# Patient Record
Sex: Male | Born: 1950
Health system: Southern US, Community
[De-identification: ages and names within clinical notes are randomized; demographics above are authoritative.]

## PROBLEM LIST (undated history)

## (undated) DIAGNOSIS — H3320 Serous retinal detachment, unspecified eye: Secondary | ICD-10-CM

## (undated) DIAGNOSIS — Z860100 Personal history of colon polyps, unspecified: Secondary | ICD-10-CM

## (undated) DIAGNOSIS — R0602 Shortness of breath: Secondary | ICD-10-CM

## (undated) DIAGNOSIS — L02219 Cutaneous abscess of trunk, unspecified: Secondary | ICD-10-CM

## (undated) DIAGNOSIS — I499 Cardiac arrhythmia, unspecified: Secondary | ICD-10-CM

## (undated) DIAGNOSIS — E119 Type 2 diabetes mellitus without complications: Secondary | ICD-10-CM

## (undated) DIAGNOSIS — C801 Malignant (primary) neoplasm, unspecified: Secondary | ICD-10-CM

## (undated) DIAGNOSIS — G4730 Sleep apnea, unspecified: Secondary | ICD-10-CM

## (undated) DIAGNOSIS — R778 Other specified abnormalities of plasma proteins: Secondary | ICD-10-CM

## (undated) DIAGNOSIS — Z8679 Personal history of other diseases of the circulatory system: Secondary | ICD-10-CM

## (undated) DIAGNOSIS — L988 Other specified disorders of the skin and subcutaneous tissue: Secondary | ICD-10-CM

## (undated) DIAGNOSIS — Z8701 Personal history of pneumonia (recurrent): Secondary | ICD-10-CM

## (undated) DIAGNOSIS — F909 Attention-deficit hyperactivity disorder, unspecified type: Secondary | ICD-10-CM

## (undated) DIAGNOSIS — M109 Gout, unspecified: Secondary | ICD-10-CM

## (undated) DIAGNOSIS — N19 Unspecified kidney failure: Secondary | ICD-10-CM

## (undated) DIAGNOSIS — R0689 Other abnormalities of breathing: Secondary | ICD-10-CM

## (undated) DIAGNOSIS — Z940 Kidney transplant status: Secondary | ICD-10-CM

## (undated) DIAGNOSIS — I517 Cardiomegaly: Secondary | ICD-10-CM

## (undated) DIAGNOSIS — I10 Essential (primary) hypertension: Secondary | ICD-10-CM

## (undated) DIAGNOSIS — L03319 Cellulitis of trunk, unspecified: Secondary | ICD-10-CM

## (undated) DIAGNOSIS — C61 Malignant neoplasm of prostate: Secondary | ICD-10-CM

## (undated) DIAGNOSIS — G4733 Obstructive sleep apnea (adult) (pediatric): Secondary | ICD-10-CM

## (undated) DIAGNOSIS — L0291 Cutaneous abscess, unspecified: Secondary | ICD-10-CM

## (undated) DIAGNOSIS — R7989 Other specified abnormal findings of blood chemistry: Secondary | ICD-10-CM

## (undated) DIAGNOSIS — L0390 Cellulitis, unspecified: Secondary | ICD-10-CM

## (undated) DIAGNOSIS — J309 Allergic rhinitis, unspecified: Secondary | ICD-10-CM

## (undated) HISTORY — DX: Personal history of other diseases of the circulatory system: Z86.79

## (undated) HISTORY — DX: Obstructive sleep apnea (adult) (pediatric): G47.33

## (undated) HISTORY — DX: Malignant (primary) neoplasm, unspecified: C80.1

## (undated) HISTORY — DX: Cellulitis, unspecified: L03.90

## (undated) HISTORY — DX: Other specified disorders of the skin and subcutaneous tissue: L98.8

## (undated) HISTORY — DX: Type 2 diabetes mellitus without complications: E11.9

## (undated) HISTORY — DX: Cutaneous abscess of trunk, unspecified: L02.219

## (undated) HISTORY — DX: Allergic rhinitis, unspecified: J30.9

## (undated) HISTORY — DX: Essential (primary) hypertension: I10

## (undated) HISTORY — DX: Serous retinal detachment, unspecified eye: H33.20

## (undated) HISTORY — DX: Personal history of colonic polyps: Z86.010

## (undated) HISTORY — DX: Unspecified kidney failure: N19

## (undated) HISTORY — DX: Personal history of colon polyps, unspecified: Z86.0100

## (undated) HISTORY — DX: Attention-deficit hyperactivity disorder, unspecified type: F90.9

## (undated) HISTORY — DX: Cardiomegaly: I51.7

## (undated) HISTORY — DX: Cutaneous abscess, unspecified: L02.91

## (undated) HISTORY — DX: Personal history of pneumonia (recurrent): Z87.01

## (undated) HISTORY — DX: Kidney transplant status: Z94.0

## (undated) HISTORY — PX: PROSTATECTOMY: SHX69

## (undated) HISTORY — DX: Gout, unspecified: M10.9

## (undated) HISTORY — DX: Sleep apnea, unspecified: G47.30

## (undated) HISTORY — DX: Other abnormalities of breathing: R06.89

## (undated) HISTORY — DX: Cellulitis of trunk, unspecified: L03.319

## (undated) HISTORY — DX: Malignant neoplasm of prostate: C61

---

## 1999-11-09 ENCOUNTER — Ambulatory Visit (HOSPITAL_COMMUNITY): Admission: RE | Admit: 1999-11-09 | Discharge: 1999-11-10 | Payer: Self-pay | Admitting: Nephrology

## 1999-11-09 ENCOUNTER — Encounter (INDEPENDENT_AMBULATORY_CARE_PROVIDER_SITE_OTHER): Payer: Self-pay | Admitting: *Deleted

## 1999-11-09 ENCOUNTER — Encounter: Payer: Self-pay | Admitting: Nephrology

## 2001-02-19 ENCOUNTER — Encounter: Admission: RE | Admit: 2001-02-19 | Discharge: 2001-05-20 | Payer: Self-pay | Admitting: Nephrology

## 2001-05-22 ENCOUNTER — Encounter: Admission: RE | Admit: 2001-05-22 | Discharge: 2001-08-20 | Payer: Self-pay | Admitting: Nephrology

## 2001-12-23 DIAGNOSIS — H3320 Serous retinal detachment, unspecified eye: Secondary | ICD-10-CM

## 2001-12-23 HISTORY — DX: Serous retinal detachment, unspecified eye: H33.20

## 2002-06-22 HISTORY — PX: RETINAL DETACHMENT SURGERY: SHX105

## 2002-07-13 ENCOUNTER — Encounter: Payer: Self-pay | Admitting: Ophthalmology

## 2002-07-13 ENCOUNTER — Ambulatory Visit (HOSPITAL_COMMUNITY): Admission: RE | Admit: 2002-07-13 | Discharge: 2002-07-14 | Payer: Self-pay | Admitting: Ophthalmology

## 2004-03-14 ENCOUNTER — Ambulatory Visit (HOSPITAL_COMMUNITY): Admission: RE | Admit: 2004-03-14 | Discharge: 2004-03-14 | Payer: Self-pay | Admitting: *Deleted

## 2004-03-14 ENCOUNTER — Encounter (INDEPENDENT_AMBULATORY_CARE_PROVIDER_SITE_OTHER): Payer: Self-pay | Admitting: *Deleted

## 2006-11-26 ENCOUNTER — Encounter: Admission: RE | Admit: 2006-11-26 | Discharge: 2006-11-26 | Payer: Self-pay | Admitting: Nephrology

## 2006-11-28 ENCOUNTER — Ambulatory Visit (HOSPITAL_COMMUNITY): Admission: RE | Admit: 2006-11-28 | Discharge: 2006-11-28 | Payer: Self-pay | Admitting: Nephrology

## 2006-11-30 ENCOUNTER — Inpatient Hospital Stay (HOSPITAL_COMMUNITY): Admission: EM | Admit: 2006-11-30 | Discharge: 2006-12-04 | Payer: Self-pay | Admitting: Emergency Medicine

## 2006-12-01 ENCOUNTER — Encounter: Payer: Self-pay | Admitting: Vascular Surgery

## 2007-04-28 ENCOUNTER — Ambulatory Visit (HOSPITAL_COMMUNITY): Admission: RE | Admit: 2007-04-28 | Discharge: 2007-04-28 | Payer: Self-pay | Admitting: Nephrology

## 2007-07-23 ENCOUNTER — Ambulatory Visit (HOSPITAL_COMMUNITY): Admission: RE | Admit: 2007-07-23 | Discharge: 2007-07-23 | Payer: Self-pay | Admitting: Nephrology

## 2009-01-13 ENCOUNTER — Ambulatory Visit: Payer: Self-pay | Admitting: Internal Medicine

## 2009-01-13 ENCOUNTER — Encounter: Admission: RE | Admit: 2009-01-13 | Discharge: 2009-01-13 | Payer: Self-pay | Admitting: Family Medicine

## 2009-01-13 ENCOUNTER — Inpatient Hospital Stay (HOSPITAL_COMMUNITY): Admission: AD | Admit: 2009-01-13 | Discharge: 2009-01-17 | Payer: Self-pay | Admitting: Internal Medicine

## 2010-11-22 DIAGNOSIS — C61 Malignant neoplasm of prostate: Secondary | ICD-10-CM

## 2010-11-22 HISTORY — PX: OTHER SURGICAL HISTORY: SHX169

## 2010-11-22 HISTORY — DX: Malignant neoplasm of prostate: C61

## 2011-01-13 ENCOUNTER — Encounter: Payer: Self-pay | Admitting: Nephrology

## 2011-04-08 LAB — GLUCOSE, CAPILLARY
Glucose-Capillary: 108 mg/dL — ABNORMAL HIGH (ref 70–99)
Glucose-Capillary: 160 mg/dL — ABNORMAL HIGH (ref 70–99)
Glucose-Capillary: 174 mg/dL — ABNORMAL HIGH (ref 70–99)
Glucose-Capillary: 228 mg/dL — ABNORMAL HIGH (ref 70–99)
Glucose-Capillary: 235 mg/dL — ABNORMAL HIGH (ref 70–99)
Glucose-Capillary: 270 mg/dL — ABNORMAL HIGH (ref 70–99)
Glucose-Capillary: 76 mg/dL (ref 70–99)
Glucose-Capillary: 94 mg/dL (ref 70–99)

## 2011-04-08 LAB — CBC
HCT: 30.4 % — ABNORMAL LOW (ref 39.0–52.0)
Hemoglobin: 10.5 g/dL — ABNORMAL LOW (ref 13.0–17.0)
MCHC: 32.5 g/dL (ref 30.0–36.0)
MCHC: 33.3 g/dL (ref 30.0–36.0)
MCV: 96.7 fL (ref 78.0–100.0)
MCV: 96.9 fL (ref 78.0–100.0)
Platelets: 399 10*3/uL (ref 150–400)
Platelets: 436 10*3/uL — ABNORMAL HIGH (ref 150–400)
RBC: 3.15 MIL/uL — ABNORMAL LOW (ref 4.22–5.81)
RBC: 3.33 MIL/uL — ABNORMAL LOW (ref 4.22–5.81)
RDW: 14.4 % (ref 11.5–15.5)
RDW: 14.4 % (ref 11.5–15.5)

## 2011-04-08 LAB — RENAL FUNCTION PANEL
BUN: 65 mg/dL — ABNORMAL HIGH (ref 6–23)
CO2: 27 mEq/L (ref 19–32)
Chloride: 93 mEq/L — ABNORMAL LOW (ref 96–112)
Creatinine, Ser: 10.98 mg/dL — ABNORMAL HIGH (ref 0.4–1.5)
Glucose, Bld: 96 mg/dL (ref 70–99)
Phosphorus: 6.4 mg/dL — ABNORMAL HIGH (ref 2.3–4.6)
Potassium: 5.1 mEq/L (ref 3.5–5.1)

## 2011-04-08 LAB — CULTURE, RESPIRATORY W GRAM STAIN

## 2011-04-08 LAB — LEGIONELLA ANTIGEN, URINE

## 2011-04-08 LAB — BASIC METABOLIC PANEL
CO2: 31 mEq/L (ref 19–32)
Calcium: 8.9 mg/dL (ref 8.4–10.5)
Chloride: 91 mEq/L — ABNORMAL LOW (ref 96–112)
Creatinine, Ser: 5.78 mg/dL — ABNORMAL HIGH (ref 0.4–1.5)
GFR calc Af Amer: 12 mL/min — ABNORMAL LOW (ref >60)
GFR calc non Af Amer: 10 mL/min — ABNORMAL LOW (ref >60)

## 2011-04-08 LAB — EXPECTORATED SPUTUM ASSESSMENT W GRAM STAIN, RFLX TO RESP C

## 2011-05-07 NOTE — Discharge Summary (Signed)
Russell Bowers, Russell Bowers            ACCOUNT NO.:  [id number]   MEDICAL RECORD NO.:  [id number]          PATIENT TYPE:  INP   LOCATION:  3023                         FACILITY:  [place]   PHYSICIAN:  Corinna L. Conley Canal, MDDATE OF BIRTH:  Nov 20, 1951   DATE OF ADMISSION:  01/13/2009  DATE OF DISCHARGE:  01/17/2009                               DISCHARGE SUMMARY   DISCHARGE DIAGNOSES:  1. Right lower lobe pneumonia, failed outpatient therapy.  2. End-stage renal disease.  3. Diabetes.  4. Hyperlipidemia.  5. History of gout.   DISCHARGE MEDICATIONS:  1. Augmentin 500 mg daily for 5 more days.  2. Doxycycline 100 mg p.o. b.i.d. for 5 more days.   Continue outpatient medications which include  1. Actos 30 mg a day.  2. Glipizide ER 10 mg a day.  3. Nephro-Vite daily.  4. Pravastatin 80 mg a day.  5. Allopurinol 300 mg one-half tablet daily.  6. PhosLo 3 tablets 3 times a day.  7. Zyrtec 10 mg as needed.  8. A cough suppressant of choice as needed.   CONDITION:  Stable.   DIET:  Diabetic, renal.   ACTIVITY:  Increase as tolerated.   CONSULTATIONS:  Louis Meckel, MD   PROCEDURES:  None.   FOLLOWUP:  Follow up with Dr. Rex Kras in 4-6 weeks.  We will need a  repeat chest x-ray to ensure resolution.   LABORATORIES:  Hemoglobin A1c of 6.1, initial white blood cell count was  11,000, hemoglobin 10.5, and hematocrit 32.  Sodium 132, potassium 3.8,  chloride 91, bicarbonate 31, glucose 159, BUN 31, and creatinine 5.78.  Sputum culture preliminarily is growing moderate Staphylococcus aureus.  Sensitivities are pending and final results are pending.  Urine  Legionella antigen negative.   SPECIAL STUDIES/RADIOLOGY:  Chest x-ray 2-views on admission showed  opacity in the superior segment of the right lower lobe, low lung  volumes, likely remote rib fractures.  Repeat chest x-ray showed no  significant change on January 16, 2009.   HISTORY AND HOSPITAL COURSE:  Mr.  Russell Bowers is a 60 year old white male  with multiple medical problems who had been on Avelox for treatment of  pneumonia as an outpatient.  Initially, he had had some hemoptysis.  This has since cleared.  He had fever.  He started to improve on Avelox,  but felt that he was getting worse.  He went to the [facility].   PHYSICAL EXAMINATION:  VITAL SIGNS:  His oxygen saturations were 89% on  room air, his temperature was 99.4, respiratory rate 32, and blood  pressure 100/74.  LUNGS:  He had decreased breath sounds at the bases, otherwise  unremarkable.  HEENT:  Moist mucous membranes.  No edema.   He was admitted and started on Zosyn and vancomycin.  His symptoms  improved.  He receives dialysis on Monday.  He has been changed to oral  antibiotics and feels much better and ready to go home at the time of  discharge.      Corinna L. Conley Canal, MD  Electronically Signed     CLS/MEDQ  D:  01/17/2009  T:  01/17/2009  Job:  [id number]   cc:   Dr. Noberto Retort L. Little, M.D.

## 2011-05-07 NOTE — H&P (Signed)
NAMELASALLE, CHEAK            ACCOUNT NO.:  [id number]   MEDICAL RECORD NO.:  [id number]          PATIENT TYPE:  INP   LOCATION:  3023                         FACILITY:  [place]   PHYSICIAN:  Evie Lacks. Plotnikov, MDDATE OF BIRTH:  1951-03-30   DATE OF ADMISSION:  01/13/2009  DATE OF DISCHARGE:                              HISTORY & PHYSICAL   CHIEF COMPLAINT:  Pneumonia.   HISTORY OF PRESENT ILLNESS:  The patient is a 60 year old male with end-  stage renal disease, on hemodialysis, who got sick with acute  respiratory illness last Friday.  With fever, cough productive for  mucous mixed with blood, and shortness of breath, he was seen in the  office and started on Avelox.  Apparently at that time, his chest x-ray  did not show an infiltrate.  He did well for 2 or 3 days, but then  started to go backwards with more shortness of breath, severe cough, and  fatigue.  He went to [facility] today.  His chest x-ray revealed  right lower lobe infiltrate and elevated white cell count.  He was sent  to [facility] for admission.  On arrival, his O2 sat was 89% oxygen on  room air.   PAST MEDICAL HISTORY:  1. Diabetes.  2. Gout.  3. End-stage renal disease, on hemodialysis Monday, Wednesday, and      Friday for 2 years.   ALLERGIES:  None.   CURRENT MEDICATIONS:  1. Actos 30 mg daily.  2. Glipizide ER 10 mg daily.  3. Nephro-Vite 1 daily.  4. Pravastatin 80 mg daily.  5. Allopurinol 300 mg one half daily.  6. PhosLo 667 three tablets three times a day.  7. Zyrtec 10 mg in springtime.   SOCIAL HISTORY:  He is married.  Ex-smoker.  He is an Optometrist.   FAMILY HISTORY:  Positive for diabetes.   REVIEW OF SYSTEMS:  Hemoptysis, shortness of breath, cough, chills,  fever, fatigue as above, and chest pain from coughing.  No syncope.  No  neurologic complaints.  The rest of 18-point review of systems as above,  were negative.   PHYSICAL EXAMINATION:  VITAL SIGNS:  Blood  pressure 100/74, temperature  99.4, respirations 32, and heart rate 100 - 110.  GENERAL:  He appears tired, in no acute distress.  HEENT:  With moist mucosa.  NECK:  Supple.  No meningeal signs.  LUNGS:  Clear with some decreased breath sounds at bases.  No wheezes.  HEART:  S1 and S2.  No gallop.  ABDOMEN:  Obese, soft, and nontender.  No organomegaly.  No mass felt.  LOWER EXTREMITIES:  Without edema.  Calves nontender.  NEUROLOGIC:  He is alert, oriented, and cooperative.  Cranial nerves II  through XII nonfocal.  Deep tendon reflexes and muscle strength within  normal limits.   LABORATORIES:  White count 15.8 and hemoglobin 11.6.  Chest x-ray at  [facility] imaging today with right lower lobe infiltrate, remote ribs  #6 and #7 on the right, fractures of undetermined age, likely remote.   ASSESSMENT AND PLAN:  1. Right lower lobe pneumonia with  outpatient Avelox treatment      failure.  We will admit for IV antibiotics.  Start on ceftazidime      and Zithromax IV.  Obtain Gram stain and culture of the sputum,      blood cultures if spikes a high fever.  2. Hemoptysis due to right lower lobe pneumonia.  3. Cough.  We will use promethazine/codeine cough syrup.  4. End-stage renal disease, on hemodialysis on Monday, Wednesday, and      Friday.  We will continue.  5. Type 2 diabetes.  We will continue current medicines and sliding      scale Humalog.  6. Hypoxemia, possible chronic obstructive pulmonary disease.  We will      use handheld nebulizer.  Start on oxygen.       Evie Lacks. Plotnikov, MD  Electronically Signed     AVP/MEDQ  D:  01/13/2009  T:  01/14/2009  Job:  [id number]   cc:   Lennette Bihari L. Little, M.D.  Dr. Jimmy Footman

## 2011-05-10 NOTE — H&P (Signed)
[facility]  Patient:    Russell Bowers, Russell Bowers Visit Number: [id number] MRN: [id number]          Service Type: DSU Location: [phone] Attending Physician:  Charlette Caffey Dictated by:   Garey Ham, M.D. Admit Date:  07/13/2002 Discharge Date: 07/14/2002   CC:         Johna Sheriff, M.D., as well as any EKG, chest x-ray, or laboratory data             Alvin C. Florene Glen, M.D., as well as any chest x-ray, EKG, or laboratory data             Lennette Bihari L. Little, M.D., as well as any EKG, chest x-ray, or laboratory data                         History and Physical  REASON FOR ADMISSION:  This was an urgent outpatient admission of this 60 year old white male admitted with a rhegmatogenous retinal detachment of the right eye.  HISTORY OF PRESENT ILLNESS:  This patient has been recently followed by Johna Sheriff, M.D., his regular ophthalmologist, for episcleritis of the right eye, treated with Acular ophthalmic solution.  The patient seemed to respond to this treatment.  The patient was out of town in [facility] when he noted the onset of a weeks duration of a visual field defect in his right eye.  It was suggested to the patient that he obtain consultation there, but he chose to see Dr. Herbert Deaner when he returned to [facility] on July 12, 2002. Dr. Herbert Deaner made the diagnosis of a rhegmatogenous retinal detachment.  The patient was referred to my office, where this diagnosis was confirmed and arrangements made for his outpatient admission at this time.  PAST MEDICAL HISTORY:  The patient is in stable general health at the present time, although has several underlying medical problems.  He is currently being followed by Dr. Hulan Fess for a noninsulin-dependent diabetes mellitus and has some underlying kidney disease treated by Dr. Florene Glen.  He states that he has had noninsulin-dependent diabetes mellitus for approximately eight  years. He has had a renal biopsy in November 2000.  MEDICATIONS:  He takes multiple medications at the present time, including Actos, Glucotrol, enalapril, nifedipine, furosemide, Zocor, and allopurinol.  REVIEW OF SYSTEMS:  No current cardiorespiratory complaints.  PHYSICAL EXAMINATION:  VITAL SIGNS:  As recorded on admission, blood pressure 148/87, temperature 98.5, heart rate 97, respirations 16.  GENERAL:  The patient is a pleasant, well-developed, well-nourished white male in acute ocular distress.  HEENT:  Eyes:  Visual acuity without correction less than 20/400 right eye, 20/300 left eye.  With correction, less than 20/400 right eye, 20/30 left eye. The patient is moderately myopic in both eyes.  Applanation tonometry 17 mm each eye.  External ocular and slit lamp examination:  The eyes are white and clear.  There is no conjunctival or episcleral injection.  The cornea is clear, anterior chamber deep and clear.  Pupil is predilated in the right eye from Dr. Marzetta Board examination.  Detailed fundus examination with retinal drawing:  Right eye:  The vitreous is clear.  There is a retinal detachment in the upper temporal quadrant with multiple thin, atrophic retinal holes and horseshoe tears.  The retinal detachment extends from the 12:30 to 8 oclock position and involves the macular area.  Optic nerve is sharply outlined and good color.  The macular is detached.  Left eye:  Clear vitreous, attached retina, normal optic nerve, blood vessels, macula.  CHEST:  Lungs clear to percussion and auscultation.  HEART:  Normal sinus rhythm.  No cardiomegaly, no murmurs.  ABDOMEN:  Negative.  EXTREMITIES:  Negative.  ADMISSION DIAGNOSIS:  Rhegmatogenous retinal detachment, right eye, multiple tears.  SURGICAL PLAN:  Scleral buckling procedure, right eye, with possible vitrectomy.  The patient and his wife were given oral discussion and printed information concerning the procedures  and possible complications.  They elected to proceed with surgery as outlined.  Arrangements were made for his outpatient admission at this time. Dictated by:   Garey Ham, M.D.  Attending Physician:  Charlette Caffey DD:  07/13/02 TD:  07/17/02 Job: [id number]

## 2011-05-10 NOTE — Discharge Summary (Signed)
NAMELY, STENCIL            ACCOUNT NO.:  [id number]   MEDICAL RECORD NO.:  [id number]          PATIENT TYPE:  INP   LOCATION:  5507                         FACILITY:  [place]   PHYSICIAN:  James L. Deterding, M.D.DATE OF BIRTH:  [birth year]   DATE OF ADMISSION:  11/30/2006  DATE OF DISCHARGE:  12/04/2006                               DISCHARGE SUMMARY   DISCHARGE DIAGNOSES:  1. Congestive heart failure, secondary to volume overload, secondary      to progressive chronic kidney disease.  2. New end-stage renal disease.  3. Hypertension.  4. Iron deficiency anemia.  5. Secondary hyperparathyroidism.  6. Type 2 diabetes mellitus.  7. Gout.  8. Fever.   PROCEDURES:  1. Vein mapping December 01, 2006 showed a probable adequate cephalic      vein on the left for access graft.  However, due to branching,      narrowing is noted in the distal upper arm to approximately 1.4 mm.      Probable adequate superficial vein (branch off from the cephalic      vein), distal upper arm to approximate mid to upper arm.  2. Hemodialysis.  3. Placement of left lower AV fistula, Dr. Oneida Alar, December 02, 2006.   HISTORY OF PRESENT ILLNESS:  Russell Bowers is a 60 year old white male  with stage 5 chronic kidney disease, secondary to IgA nephropathy which  is biopsy-proven and is followed by Dr. Erling Cruz of [facility].  The patient gives a history, stating his creatinine has  risen steeply since August 2007, when it was in the 4's to approximately  12 last week, when he came into the office with symptoms of early  uremia.  He was told to stop his Lasix 160 mg every a.m., and a right IJ  PermCath was placed December 7th by interventional radiology at [facility], with plans to start hemodialysis the following week.   Today, the patient presents to Urgent Care at [facility],  complaining of three nights of orthopnea, shortness of breath, dyspnea  on exertion and  worsening lower extremity edema.  Chest x-ray shows mild  congestive heart failure, is transferred to [facility] emergency room where  his BUN was found to be 181, creatinine 14.2.  Chest x-ray showed CHF.  He was admitted for emergent dialysis and further evaluation and  treatment.   PROBLEM:  1. Volume overload secondary to CHF, secondary to progressive kidney      disease.  The patient had serial hemodialysis treatments during his      hospitalization.  He had a net decrease of 12 kg during his      hospital stay.  He was feeling much better at the time of discharge      and will be discharged with a dry weight of 107 kg.  However, I      think this can be lowered further in the outpatient setting,  2. End-stage renal disease.  As previously mentioned, the patient      started on dialysis with serial treatments.  He tolerated this  well.  He was noted to have metabolic acidosis on admission which      was corrected.  Arrangements were made for him to start at the      [facility] Monday, Wednesday, Friday at noon.  Dr.      Jimmy Footman has been discussing starting him on home training, as      soon as space is available.  The patient is somewhat reluctant to      do this now.  He thinks the dialysis catheter will be more      complicated in the fistula; however, I think it would be ideal for      him to start with the catheter and transition to the fistula.      Dialysis orders were faxed to his kidney center, and he will      present there tomorrow at 11:00 a.m., where he will talk to the      social worker prior to starting dialysis.  He also had a left lower      AV fistula placed by Dr. Oneida Alar on December 11.  The staff had      already reviewed how to exercise his fistula starting next week,      and gave him a small squeezable kidney to practice with.  3. Hypertension.  The patient's blood pressure on admission was      170/85.  This was much improved at the time of  discharge. His      Procardia had been decreased to 30 mg per day.  I elected to stop      this and work on lowering his dry weight in the outpatient setting.      If need be, it can be resumed in the future.  Post dialysis blood      pressure on the 12th was 136/65, sitting with a post weight of      107.9 kg.  4. Iron-deficiency anemia.  The patient's hemoglobin on admission was      8.3.  Iron studies were checked and found to be low.  He was      started on a course of IV iron to replete these stores, as well as      starting on Aranesp.  He was transitioned to the Epogen at the time      of discharge.  This will be monitored per protocol by the nurses at      his dialysis center.  He is on tight Heparin for 1 week, status      post his fistula placement and then standard.  5. Secondary hyperparathyroidism.  PTH was checked on admission and      found to be 295.  Hectorol was started at 0.5 mcg.  His phosphorus      was elevated.  Even at the time of discharge, his phosphorus was      8.9 with a calcium of 9.1.  I expect this to correct with further      dialysis.  He will be monitored per protocol.  He is being      discharged on PhosLo 2 with meals three times a day, and this may      need to be titrated up.  He did receive diet teaching while in the      hospital, from the renal dietician.  6. Type 2 diabetes mellitus.  The patient has been controlling his      blood sugar with Actos  and Glipizide.  Dr. Justin Mend wanted to start him      on Lantus as well as continue these medicines, due to his high      sugars; however, the patient refused to have Lantus started at this      time, stating that his hemoglobin A1c had been in the 5 range in      the outpatient setting.  We will continue diabetes management as it      stands, and he will have Accu-Cheks every dialysis at his kidney      center, and hemoglobin A1c is check quarterly per protocol. 7. Gout.  The patient was not symptomatic  of any gout.  He was on      allopurinol 150 mg per day.  8. Fever.  The patient had transient fever during his hospitalization.      Blood cultures were done on the 10th.  No growth was present at the      time of discharge.  His white count was 12,000, and he was      afebrile.  Dialysis center nurses will follow up on these results.  9. Obesity.  The patient lost a lot of fluid weight during his      hospitalization.  He has also in the last year joined a local which      gym and wants to continue doing exercise and is motivated to lose      additional weight.  I advised he could start back gradually and      recommended that he start back to work part-time next week, which      is what he wanted to do that, and then perhaps the following week      start back at the gym on a very gradual basis, adding a little      something extra every week but not to overdo it, as he makes his      transition to including dialysis in his life.   CONDITION ON DISCHARGE:  Improved.   DISCHARGE MEDICATIONS:  1. Nephro-Vite 1 tablet per day.  2. PhosLo 667 mg, 2 with meals three times a day.  3. Glucotrol 10 mg in the a.m.  4. Colace 200 mg at bedtime.  5. Zyloprim 150 mg per day.  6. Actos 40 mg in the morning.  He is advised to stop Lasix, Adalat and calcitriol.   DIET:  He is to be on renal diabetic diet.  Limit fluids to 1200 mL per  day.   ACTIVITY:  Is as tolerated.  He is not to take showers while he has a  catheter.  He may return to work Monday the 17th, part-time as  tolerated.  He is to go to dialysis tomorrow at 11:00 a.m. for his  Monday, Wednesday, Friday treatment, and dialysis orders were faxed with  outpatient dialysis treatment.   PERTINENT LABS:  At the time of discharge were:  Hepatitis B surface  antigen negative, hemoglobin 10.9, potassium 4.3, phosphorus 8.9, PSAT  12%,  ferritin 229, calcium 9.1, albumin 2.8.  His new dry weight will  be 107 kg, and his dialysis  treatment be 4 hours with a 200 non-reuse  kidney.      Russell Bowers, P.A.    ______________________________  Joyice Faster. Deterding, M.D.    MB/MEDQ  D:  12/04/2006  T:  12/04/2006  Job:  [id number]   cc:   Deanna Artis, MD  Dr. Benito Mccreedy  Marilu Favre, M.D.

## 2011-05-10 NOTE — Op Note (Signed)
NAMELJ, SLIMAN            ACCOUNT NO.:  [id number]   MEDICAL RECORD NO.:  [id number]          PATIENT TYPE:  INP   LOCATION:  5507                         FACILITY:  [place]   PHYSICIAN:  Jessy Oto. Fields, MD  DATE OF BIRTH:  06/02/1951   DATE OF PROCEDURE:  12/02/2006  DATE OF DISCHARGE:                               OPERATIVE REPORT   PROCEDURE:  Left radiocephalic AV fistula.   PREOPERATIVE DIAGNOSIS:  End-stage renal disease.   POSTOPERATIVE DIAGNOSIS:  End-stage renal disease.   ANESTHESIA:  Local with IV sedation   ASSISTANT:  Nurse   INDICATIONS:  The patient is a 60 year old male with end-stage renal  disease secondary to IgA nephropathy.  He now presents for elective  placement of permanent hemodialysis access.   OPERATIVE FINDINGS:  1. A 123456 mm cephalic vein.  2. A 1.5-mm radial artery.   OPERATIVE DETAILS:  After obtaining informed consent, the patient was  taken to the operating room.  The patient was placed in the supine  position on the operating table.  After adequate sedation, the patient's  entire left upper extremity is prepped and draped in the usual sterile  fashion.  Local anesthesia was infiltrated midway between the cephalic  vein and radial artery.  A longitudinal incision was made in this  location.  The cephalic vein was dissected free circumferentially and a  small side branch was ligated and divided between silk ties.   The radial artery was then dissected free in the medial portion of the  incision.  This was controlled proximally and distally with vessel  loops.  A small side branch was ligated between silk ties or clips.  The  patient was then given 5000 units of intravenous heparin.  A  longitudinal arteriotomy was made in the radial artery; and this was  thoroughly irrigated with heparinized saline.  Distal cephalic vein was  ligated with a 3-0 silk tie.  The vein was then transected, thoroughly  flushed with heparinized saline  and distended.  It was then swung over  to the level of the radial artery.  The vein was marked for orientation;  and then sewn end of vein to side of artery using a running 7-0 Prolene  suture.  Just prior to completion anastomosis, this was fore bled back  bled and thoroughly flushed.  Anastomosis was secured.  There was  pulsatile flow with a faint thrill immediately.   Next, hemostasis was obtained.  Subcutaneous tissues were reapproximated  using a running 3-0 Vicryl suture.  Skin was closed with 4-0 Vicryl  subcuticular stitch.  The patient tolerated the procedure well and there  were no other complications.  Instrument, sponge, and needle counts were  correct at the end of the case.  The patient was taken to the recovery  room in stable condition.      Jessy Oto. Fields, MD  Electronically Signed     CEF/MEDQ  D:  12/02/2006  T:  12/02/2006  Job:  [id number]

## 2011-05-10 NOTE — Discharge Summary (Signed)
[place]. [facility]  Patient:    Russell Bowers, Russell Bowers Visit Number: [id number] MRN: [id number]          Service Type: DSU Location: [phone] Attending Physician:  Charlette Caffey Dictated by:   Garey Ham, M.D. Admit Date:  07/13/2002 Discharge Date: 07/14/2002   CC:         Johna Sheriff, M.D., as well as any EKG, chest x-ray, or laboratory data             Alvin C. Florene Glen, M.D., as well as any chest x-ray, EKG, or laboratory data             Lennette Bihari L. Little, M.D., as well as any EKG, chest x-ray, or laboratory data                           Discharge Summary  HISTORY OF PRESENT ILLNESS:  This was an urgent outpatient admission of this 60 year old white male admitted with a rhegmatogenous retinal detachment of the right eye with multiple tears.  HOSPITAL COURSE:  Preoperatively the patient was felt to be in satisfactory condition for the proposed surgery under general anesthesia.  He therefore was taken to the operating room, where a scleral buckling procedure was performed under general anesthesia without complication.  The patient tolerated the hour and a half procedure under general anesthesia and was taken to the recovery room and subsequently to the 23-hour observation unit.  The patient was placed on his back or right side for postoperative positioning.  The patient was seen again on the evening of surgery and the following morning and felt to be in satisfactory condition.  The vitreous was clear, the retina reattached on the implant surface, and the small amount of subretinal fluid which had remained at the end of the procedure absorbed.  It was felt that the patient had achieved maximal hospital benefit and that he could be discharged home to be followed in the office.  The patient and his wife were given a printed list of discharge instructions on the care and use of the operated eye.  DISCHARGE MEDICATIONS:  Discharge  ocular medications included Tobradex and Cyclomydril ophthalmic solutions one drop four times a day five minutes apart, and Maxitrol and atropine ointment at bedtime.  CONDITION ON DISCHARGE:  Improved.  DISCHARGE DIAGNOSIS:  Rhegmatogenous retinal detachment, right eye, multiple tears.  FOLLOW-UP APPOINTMENT:  In my office, three to five days.  Patient to call office for specific time and date. Dictated by:   Garey Ham, M.D. Attending Physician:  Charlette Caffey DD:  07/13/02 TD:  07/17/02 Job: [id number]

## 2011-05-10 NOTE — Op Note (Signed)
[place]. [facility]  Patient:    GREIG, KROTH Visit Number: [id number] MRN: [id number]          Service Type: DSU Location: [phone] Attending Physician:  Charlette Caffey Dictated by:   Garey Ham, M.D. Proc. Date: 07/13/02 Admit Date:  07/13/2002 Discharge Date: 07/14/2002   CC:         Johna Sheriff, M.D., as well as any EKG, chest x-ray, or laboratory data             Darrold Span. Florene Glen, M.D., as well as any chest x-ray, EKG, or laboratory data             Lennette Bihari L. Little, M.D., as well as any EKG, chest x-ray, or laboratory data                           Operative Report  PREOPERATIVE DIAGNOSIS:  Rhegmatogenous retinal detachment, right eye, multiple tears.  POSTOPERATIVE DIAGNOSIS:  Rhegmatogenous retinal detachment, right eye, multiple tears.  PROCEDURES: 1. Scleral buckling procedure using solid silicone implants [id number] and 240. 2. Diathermy application. 3. Cryoapplication. 4. Trans-scleral drainage of subretinal fluid.  SURGEON:  Garey Ham, M.D.  ASSISTANT:  Nurse.  ANESTHESIA:  General.  OPHTHALMOSCOPY:  As previously described and drawn.  DESCRIPTION OF PROCEDURE:  After the patient was prepped and draped, lid traction sutures were placed in the right upper and lower lids.  A lid speculum was inserted.  A limbal peritomy was performed 360 degrees. The subconjunctival tissue was cleaned and the four rectus muscles looped with 4-0 silk traction sutures.  The sclera was inspected and felt to be in satisfactory condition for the lamellar scleral dissection.  Localization was then carried out with the multiple retinal breaks being treated with direct cryoapplication.  Lamellar scleral dissection was then carried out from the 12:30 to 8 oclock position, the bed measuring 9 mm in width.  Light diathermy applications were applied to the intrascleral lamella.  A total of four 4-0 green Mersilene sutures  were placed across the scleral flaps.  Later a 5-0 white Dacron suture was also added across the scleral flaps.  A [id number] solid silicone implant was then placed in the bed.  A 123456 solid silicone encircling band was placed about the globe, tied with two sutures at the 4 oclock position.  An anchoring suture at the 4:30 position held the encircling band in place at the equator.  After repeat indirect ophthalmoscopy it was elected to drain fluid in the bed at the 9:30 position.  Incision was made through the intrascleral lamella, the choroid exposed, treated with light diathermy, and then perforated with the pen electrode.  An abundant amount of clear subretinal fluid drained and then stopped.  The scleral flaps were pulled up and the fundus inspected, revealing some flattening of the retina but residual subretinal fluid.  The drain site was then reopened and further drainage carried out.  The flaps were then again closed and the tension of the encircling band adjusted.  The patient had been given Diamox 500 mg at the beginning of the operative procedure intravenously.  The scleral buckling indentation appeared to be satisfactory and the retinal tears appeared to be in position on the implant surface.  There was a minimal amount of subretinal fluid.  It was elected, therefore, to close, feeling that the small amount of residual fluid would absorb spontaneously.  The scleral  flaps were secured permanently and the tension of the encircling band adjusted.  Tenons capsule was pulled forward in the four quadrants and tied as a separate layer with a 6-0 chromic catgut suture.  Neosporin ophthalmic solution was irrigated in the sub-Tenons space and over the implants as the surgery was closing. Conjunctiva was then pulled forward and closed with a running 6-0 chromic catgut suture.  Depo-Garamycin and dexamethasone were injected in the sub-Tenons space inferiorly.  Maxitrol and atropine ointment were  instilled in the conjunctival cul-de-sac and a light patch and protective shield applied.  Duration of procedure:  1-1/2 hours.  The patient left the operating room for the recovery room and subsequently to the 23-hour observation unit. The patient was placed on his back or preferably toward the right side to allow settling of the retina on the implant surface. Dictated by:   Garey Ham, M.D. Attending Physician:  Charlette Caffey DD:  07/13/02 TD:  07/17/02 Job: [id number] [id number]

## 2011-05-10 NOTE — H&P (Signed)
NAMEJABRI, STURDEVANT            ACCOUNT NO.:  [id number]   MEDICAL RECORD NO.:  [id number]          PATIENT TYPE:  INP   LOCATION:  5507                         FACILITY:  [place]   PHYSICIAN:  James L. Deterding, M.D.DATE OF BIRTH:  Nov 12, 1951   DATE OF ADMISSION:  11/30/2006  DATE OF DISCHARGE:                              HISTORY & PHYSICAL   REASON FOR ADMISSION:  Uremia, congestive heart failure.   HISTORY OF PRESENT ILLNESS:  A 60 year old white male with stage V  chronic kidney disease secondary to IgA nephropathy biopsy proven and is  followed by Dr. Erling Cruz, [facility].  The patient  gives a history stating that his creatinine has risen steeply since  August 2007 when it was in the 4's to approximately 12 last week when he  came to the office with symptoms of early uremia.  He was told to stop  his Lasix 160 mg q.a.m. and a right internal jugular PermCath was placed  November 28, 2006 by interventional radiology at [facility].  Arrangements are now finalized for outpatient dialysis to start this  coming week.   Today, the patient presented to an Urgent Care at [facility] complaining of three nights of orthopnea, shortness of breath,  dyspnea on exertion and worsening lower extremity edema.  Chest x-ray  shows mild congestive heart failure.  He was transferred to [facility]  Emergency Room where his BUN is found to be 181, his creatinine 14.2 and  his chest x-ray shows CHF.  He is being admitted now to initiate  dialysis.   ALLERGIES:  No known drug allergies.   CURRENT MEDICATIONS:  1. Actos 30 mg daily.  2. Glipizide ER 10 mg daily.  3. Allopurinol 150 mg daily.  4. Adalat 60 mg b.i.d.  5. Renagel 800 mg three with meals.  6. __________ stool softener two daily.  7. Pravastatin 80 mg on hold.   PAST MEDICAL HISTORY:  1. Non-insulin-dependent type 2 diabetes mellitus since 1995.  2. Hypertension.  3. Chronic kidney disease stage IV  secondary to biopsy-proven IgA      nephropathy.  4. History of right retinal detachment.  5. Gout.   FAMILY HISTORY:  Positive for diabetes, hypertension and myocardial  infarction.   SOCIAL HISTORY:  The patient is married.  He is a working Optometrist.  He has two children who are grown and alive and well.  He quit smoking  approximately 15 years ago.  Denies any alcohol use except for rare beer  on occasion.   REVIEW OF SYSTEMS:  Positive for general malaise, fatigue and loss of  appetite, weight gain due to fluid retention.  Intermittent low-grade  nausea and but no vomiting.  Positive for chronic constipation.  Lower  extremity edema.  A metallic taste in his mouth.  Negative for URI,  fever, chills, diarrhea, chest pain, palpitations, abdominal pain,  dysuria or hematuria.  The patient denies skin rashes, arthralgias,  myalgias.   PHYSICAL EXAM:  On admission blood pressure 170/85, pulse 110 and  regular, temperature 97, respirations are 22, O2 sats 95% on room  air.  In general, an obese, well-developed white male in mild respiratory  distress.  He is awake, alert, appropriate, oriented x3.  HEENT:  Normocephalic, atraumatic.  Pupils equal, round, reactive to  light.  EOMs are intact.  Sclerae anicteric.  No facial edema.  No  facial droop.  Neck is supple.  Positive JVD.  LUNGS:  Rales halfway up lung fields.  Trace sacral edema present.  HEART:  Regular rate and rhythm with ectopy.  Grade 2/6 systolic murmur.  No rub audible.  ABDOMEN:  Positive bowel sounds, soft, nontender, nondistended.  Liver  descends down approximately 4 cm.  EXTREMITIES:  There is 3+ pitting edema to the knees, positive  asterixis, 2+ dorsalis pedis pulses bilaterally.   LABS:  Chest x-ray shows minimal congestive heart failure with cardiac  enlargement.  White count 7300, hemoglobin 9.8, platelets 279,000.  Sodium 129, potassium 3.7, chloride 93, CO2 16, BUN 181, creatinine  14.2, glucose  254.   ASSESSMENT/PLAN:  1. Congestive heart failure secondary to volume overload due to      worsening chronic kidney disease, need to start dialysis.  2. Uremia secondary to IgA nephropathy now with end-stage renal      disease.  Plan to start dialysis.  3. Metabolic acidosis secondary to chronic kidney disease.  4. IgA nephropathy.  5. Anemia of chronic disease:  Check iron studies and initiate empiric      Aranesp.  6. Hypertension:  Continue Adalat b.i.d. follow with fluid removal.  7. Secondary hyperparathyroidism.  Check a parathyroid hormone level      and phosphorus.  8. Type 2 diabetes mellitus:  Continue Actos and glipizide.  Do Accu-      Cheks and use sliding scale insulin.  9. History of right retinal detachment.  10.History of gout:  Continue allopurinol.  11.Status post right internal jugular Diatek catheter November 28, 2006      by internal interventional radiology at [facility] Dr. Reesa Chew.      Plan to obtain vein mapping of the left arm, save the left arm and      have CVTS consulted for possibly fistula creation this admission.      Nonah Mattes, P.A.    ______________________________  Joyice Faster. Deterding, M.D.    RRK/MEDQ  D:  11/30/2006  T:  12/01/2006  Job:  [id number]

## 2011-12-01 HISTORY — PX: KIDNEY TRANSPLANT: SHX239

## 2011-12-26 DIAGNOSIS — I10 Essential (primary) hypertension: Secondary | ICD-10-CM | POA: Diagnosis not present

## 2011-12-26 DIAGNOSIS — Z940 Kidney transplant status: Secondary | ICD-10-CM | POA: Diagnosis not present

## 2011-12-26 DIAGNOSIS — Z79899 Other long term (current) drug therapy: Secondary | ICD-10-CM | POA: Diagnosis not present

## 2011-12-26 DIAGNOSIS — E119 Type 2 diabetes mellitus without complications: Secondary | ICD-10-CM | POA: Diagnosis not present

## 2011-12-30 DIAGNOSIS — I10 Essential (primary) hypertension: Secondary | ICD-10-CM | POA: Diagnosis not present

## 2011-12-30 DIAGNOSIS — N186 End stage renal disease: Secondary | ICD-10-CM | POA: Diagnosis not present

## 2011-12-30 DIAGNOSIS — Z48298 Encounter for aftercare following other organ transplant: Secondary | ICD-10-CM | POA: Diagnosis not present

## 2011-12-30 DIAGNOSIS — Z8546 Personal history of malignant neoplasm of prostate: Secondary | ICD-10-CM | POA: Diagnosis not present

## 2011-12-30 DIAGNOSIS — Z79899 Other long term (current) drug therapy: Secondary | ICD-10-CM | POA: Diagnosis not present

## 2011-12-30 DIAGNOSIS — N059 Unspecified nephritic syndrome with unspecified morphologic changes: Secondary | ICD-10-CM | POA: Diagnosis not present

## 2011-12-30 DIAGNOSIS — I120 Hypertensive chronic kidney disease with stage 5 chronic kidney disease or end stage renal disease: Secondary | ICD-10-CM | POA: Diagnosis not present

## 2011-12-30 DIAGNOSIS — N529 Male erectile dysfunction, unspecified: Secondary | ICD-10-CM | POA: Diagnosis not present

## 2011-12-30 DIAGNOSIS — E119 Type 2 diabetes mellitus without complications: Secondary | ICD-10-CM | POA: Diagnosis not present

## 2011-12-30 DIAGNOSIS — Z940 Kidney transplant status: Secondary | ICD-10-CM | POA: Diagnosis not present

## 2011-12-30 DIAGNOSIS — E139 Other specified diabetes mellitus without complications: Secondary | ICD-10-CM | POA: Diagnosis not present

## 2012-01-06 DIAGNOSIS — Z79899 Other long term (current) drug therapy: Secondary | ICD-10-CM | POA: Diagnosis not present

## 2012-01-06 DIAGNOSIS — I10 Essential (primary) hypertension: Secondary | ICD-10-CM | POA: Diagnosis not present

## 2012-01-06 DIAGNOSIS — Z940 Kidney transplant status: Secondary | ICD-10-CM | POA: Diagnosis not present

## 2012-01-06 DIAGNOSIS — E119 Type 2 diabetes mellitus without complications: Secondary | ICD-10-CM | POA: Diagnosis not present

## 2012-01-06 DIAGNOSIS — Z48298 Encounter for aftercare following other organ transplant: Secondary | ICD-10-CM | POA: Diagnosis not present

## 2012-01-14 DIAGNOSIS — Z79899 Other long term (current) drug therapy: Secondary | ICD-10-CM | POA: Diagnosis not present

## 2012-01-14 DIAGNOSIS — R7989 Other specified abnormal findings of blood chemistry: Secondary | ICD-10-CM | POA: Diagnosis not present

## 2012-01-14 DIAGNOSIS — N189 Chronic kidney disease, unspecified: Secondary | ICD-10-CM | POA: Diagnosis not present

## 2012-01-14 DIAGNOSIS — Z940 Kidney transplant status: Secondary | ICD-10-CM | POA: Diagnosis not present

## 2012-01-21 DIAGNOSIS — Z48298 Encounter for aftercare following other organ transplant: Secondary | ICD-10-CM | POA: Diagnosis not present

## 2012-01-21 DIAGNOSIS — Z940 Kidney transplant status: Secondary | ICD-10-CM | POA: Diagnosis not present

## 2012-01-21 DIAGNOSIS — Z79899 Other long term (current) drug therapy: Secondary | ICD-10-CM | POA: Diagnosis not present

## 2012-01-21 DIAGNOSIS — C61 Malignant neoplasm of prostate: Secondary | ICD-10-CM | POA: Diagnosis not present

## 2012-01-21 DIAGNOSIS — N186 End stage renal disease: Secondary | ICD-10-CM | POA: Diagnosis not present

## 2012-01-21 DIAGNOSIS — I129 Hypertensive chronic kidney disease with stage 1 through stage 4 chronic kidney disease, or unspecified chronic kidney disease: Secondary | ICD-10-CM | POA: Diagnosis not present

## 2012-01-21 DIAGNOSIS — R609 Edema, unspecified: Secondary | ICD-10-CM | POA: Diagnosis not present

## 2012-01-21 DIAGNOSIS — R05 Cough: Secondary | ICD-10-CM | POA: Diagnosis not present

## 2012-01-21 DIAGNOSIS — I10 Essential (primary) hypertension: Secondary | ICD-10-CM | POA: Diagnosis not present

## 2012-01-21 DIAGNOSIS — E119 Type 2 diabetes mellitus without complications: Secondary | ICD-10-CM | POA: Diagnosis not present

## 2012-01-21 DIAGNOSIS — R059 Cough, unspecified: Secondary | ICD-10-CM | POA: Diagnosis not present

## 2012-01-28 DIAGNOSIS — Z79899 Other long term (current) drug therapy: Secondary | ICD-10-CM | POA: Diagnosis not present

## 2012-01-28 DIAGNOSIS — I120 Hypertensive chronic kidney disease with stage 5 chronic kidney disease or end stage renal disease: Secondary | ICD-10-CM | POA: Diagnosis not present

## 2012-01-28 DIAGNOSIS — Z794 Long term (current) use of insulin: Secondary | ICD-10-CM | POA: Diagnosis not present

## 2012-01-28 DIAGNOSIS — N186 End stage renal disease: Secondary | ICD-10-CM | POA: Diagnosis not present

## 2012-01-28 DIAGNOSIS — Z09 Encounter for follow-up examination after completed treatment for conditions other than malignant neoplasm: Secondary | ICD-10-CM | POA: Diagnosis not present

## 2012-01-28 DIAGNOSIS — M7989 Other specified soft tissue disorders: Secondary | ICD-10-CM | POA: Diagnosis not present

## 2012-01-28 DIAGNOSIS — Z940 Kidney transplant status: Secondary | ICD-10-CM | POA: Diagnosis not present

## 2012-01-28 DIAGNOSIS — E119 Type 2 diabetes mellitus without complications: Secondary | ICD-10-CM | POA: Diagnosis not present

## 2012-01-28 DIAGNOSIS — R059 Cough, unspecified: Secondary | ICD-10-CM | POA: Diagnosis not present

## 2012-01-28 DIAGNOSIS — I10 Essential (primary) hypertension: Secondary | ICD-10-CM | POA: Diagnosis not present

## 2012-01-28 DIAGNOSIS — R0789 Other chest pain: Secondary | ICD-10-CM | POA: Diagnosis not present

## 2012-02-01 DIAGNOSIS — R918 Other nonspecific abnormal finding of lung field: Secondary | ICD-10-CM | POA: Diagnosis not present

## 2012-02-01 DIAGNOSIS — E872 Acidosis, unspecified: Secondary | ICD-10-CM | POA: Diagnosis not present

## 2012-02-01 DIAGNOSIS — R5383 Other fatigue: Secondary | ICD-10-CM | POA: Diagnosis not present

## 2012-02-01 DIAGNOSIS — E1165 Type 2 diabetes mellitus with hyperglycemia: Secondary | ICD-10-CM | POA: Diagnosis present

## 2012-02-01 DIAGNOSIS — Z79899 Other long term (current) drug therapy: Secondary | ICD-10-CM | POA: Diagnosis not present

## 2012-02-01 DIAGNOSIS — R079 Chest pain, unspecified: Secondary | ICD-10-CM | POA: Diagnosis present

## 2012-02-01 DIAGNOSIS — R0602 Shortness of breath: Secondary | ICD-10-CM | POA: Diagnosis not present

## 2012-02-01 DIAGNOSIS — J90 Pleural effusion, not elsewhere classified: Secondary | ICD-10-CM | POA: Diagnosis not present

## 2012-02-01 DIAGNOSIS — J189 Pneumonia, unspecified organism: Secondary | ICD-10-CM | POA: Diagnosis not present

## 2012-02-01 DIAGNOSIS — I498 Other specified cardiac arrhythmias: Secondary | ICD-10-CM | POA: Diagnosis not present

## 2012-02-01 DIAGNOSIS — Z4682 Encounter for fitting and adjustment of non-vascular catheter: Secondary | ICD-10-CM | POA: Diagnosis not present

## 2012-02-01 DIAGNOSIS — N529 Male erectile dysfunction, unspecified: Secondary | ICD-10-CM | POA: Diagnosis present

## 2012-02-01 DIAGNOSIS — Z781 Physical restraint status: Secondary | ICD-10-CM | POA: Diagnosis not present

## 2012-02-01 DIAGNOSIS — N189 Chronic kidney disease, unspecified: Secondary | ICD-10-CM | POA: Diagnosis present

## 2012-02-01 DIAGNOSIS — R6521 Severe sepsis with septic shock: Secondary | ICD-10-CM | POA: Diagnosis not present

## 2012-02-01 DIAGNOSIS — I517 Cardiomegaly: Secondary | ICD-10-CM | POA: Diagnosis not present

## 2012-02-01 DIAGNOSIS — R4182 Altered mental status, unspecified: Secondary | ICD-10-CM | POA: Diagnosis not present

## 2012-02-01 DIAGNOSIS — I10 Essential (primary) hypertension: Secondary | ICD-10-CM | POA: Diagnosis not present

## 2012-02-01 DIAGNOSIS — F29 Unspecified psychosis not due to a substance or known physiological condition: Secondary | ICD-10-CM | POA: Diagnosis present

## 2012-02-01 DIAGNOSIS — R000 Tachycardia, unspecified: Secondary | ICD-10-CM | POA: Diagnosis not present

## 2012-02-01 DIAGNOSIS — J9819 Other pulmonary collapse: Secondary | ICD-10-CM | POA: Diagnosis not present

## 2012-02-01 DIAGNOSIS — Z9489 Other transplanted organ and tissue status: Secondary | ICD-10-CM | POA: Diagnosis not present

## 2012-02-01 DIAGNOSIS — D649 Anemia, unspecified: Secondary | ICD-10-CM | POA: Diagnosis present

## 2012-02-01 DIAGNOSIS — J984 Other disorders of lung: Secondary | ICD-10-CM | POA: Diagnosis not present

## 2012-02-01 DIAGNOSIS — C61 Malignant neoplasm of prostate: Secondary | ICD-10-CM | POA: Diagnosis present

## 2012-02-01 DIAGNOSIS — Z794 Long term (current) use of insulin: Secondary | ICD-10-CM | POA: Diagnosis not present

## 2012-02-01 DIAGNOSIS — I248 Other forms of acute ischemic heart disease: Secondary | ICD-10-CM | POA: Diagnosis not present

## 2012-02-01 DIAGNOSIS — Z940 Kidney transplant status: Secondary | ICD-10-CM | POA: Diagnosis not present

## 2012-02-01 DIAGNOSIS — T8610 Unspecified complication of kidney transplant: Secondary | ICD-10-CM | POA: Diagnosis not present

## 2012-02-01 DIAGNOSIS — I129 Hypertensive chronic kidney disease with stage 1 through stage 4 chronic kidney disease, or unspecified chronic kidney disease: Secondary | ICD-10-CM | POA: Diagnosis present

## 2012-02-01 DIAGNOSIS — E118 Type 2 diabetes mellitus with unspecified complications: Secondary | ICD-10-CM | POA: Diagnosis present

## 2012-02-01 DIAGNOSIS — E119 Type 2 diabetes mellitus without complications: Secondary | ICD-10-CM | POA: Diagnosis not present

## 2012-02-01 DIAGNOSIS — R0989 Other specified symptoms and signs involving the circulatory and respiratory systems: Secondary | ICD-10-CM | POA: Diagnosis not present

## 2012-02-01 DIAGNOSIS — J9600 Acute respiratory failure, unspecified whether with hypoxia or hypercapnia: Secondary | ICD-10-CM | POA: Diagnosis not present

## 2012-02-01 DIAGNOSIS — E8779 Other fluid overload: Secondary | ICD-10-CM | POA: Diagnosis present

## 2012-02-01 DIAGNOSIS — R0902 Hypoxemia: Secondary | ICD-10-CM | POA: Diagnosis present

## 2012-02-01 DIAGNOSIS — A419 Sepsis, unspecified organism: Secondary | ICD-10-CM | POA: Diagnosis not present

## 2012-02-01 DIAGNOSIS — Z87891 Personal history of nicotine dependence: Secondary | ICD-10-CM | POA: Diagnosis not present

## 2012-02-02 DIAGNOSIS — I517 Cardiomegaly: Secondary | ICD-10-CM | POA: Diagnosis not present

## 2012-02-11 DIAGNOSIS — Z79899 Other long term (current) drug therapy: Secondary | ICD-10-CM | POA: Diagnosis not present

## 2012-02-11 DIAGNOSIS — E119 Type 2 diabetes mellitus without complications: Secondary | ICD-10-CM | POA: Diagnosis not present

## 2012-02-11 DIAGNOSIS — E139 Other specified diabetes mellitus without complications: Secondary | ICD-10-CM | POA: Diagnosis not present

## 2012-02-11 DIAGNOSIS — I10 Essential (primary) hypertension: Secondary | ICD-10-CM | POA: Diagnosis not present

## 2012-02-11 DIAGNOSIS — Z940 Kidney transplant status: Secondary | ICD-10-CM | POA: Diagnosis not present

## 2012-02-12 DIAGNOSIS — Z9079 Acquired absence of other genital organ(s): Secondary | ICD-10-CM | POA: Diagnosis not present

## 2012-02-12 DIAGNOSIS — R918 Other nonspecific abnormal finding of lung field: Secondary | ICD-10-CM | POA: Diagnosis not present

## 2012-02-12 DIAGNOSIS — J9600 Acute respiratory failure, unspecified whether with hypoxia or hypercapnia: Secondary | ICD-10-CM | POA: Diagnosis not present

## 2012-02-12 DIAGNOSIS — C61 Malignant neoplasm of prostate: Secondary | ICD-10-CM | POA: Diagnosis not present

## 2012-02-12 DIAGNOSIS — E872 Acidosis, unspecified: Secondary | ICD-10-CM | POA: Diagnosis not present

## 2012-02-12 DIAGNOSIS — D509 Iron deficiency anemia, unspecified: Secondary | ICD-10-CM | POA: Diagnosis present

## 2012-02-12 DIAGNOSIS — R0602 Shortness of breath: Secondary | ICD-10-CM | POA: Diagnosis not present

## 2012-02-12 DIAGNOSIS — J90 Pleural effusion, not elsewhere classified: Secondary | ICD-10-CM | POA: Diagnosis not present

## 2012-02-12 DIAGNOSIS — I4949 Other premature depolarization: Secondary | ICD-10-CM | POA: Diagnosis present

## 2012-02-12 DIAGNOSIS — Z48298 Encounter for aftercare following other organ transplant: Secondary | ICD-10-CM | POA: Diagnosis not present

## 2012-02-12 DIAGNOSIS — Z4682 Encounter for fitting and adjustment of non-vascular catheter: Secondary | ICD-10-CM | POA: Diagnosis not present

## 2012-02-12 DIAGNOSIS — E119 Type 2 diabetes mellitus without complications: Secondary | ICD-10-CM | POA: Diagnosis not present

## 2012-02-12 DIAGNOSIS — J9819 Other pulmonary collapse: Secondary | ICD-10-CM | POA: Diagnosis not present

## 2012-02-12 DIAGNOSIS — Z79899 Other long term (current) drug therapy: Secondary | ICD-10-CM | POA: Diagnosis not present

## 2012-02-12 DIAGNOSIS — G4734 Idiopathic sleep related nonobstructive alveolar hypoventilation: Secondary | ICD-10-CM | POA: Diagnosis present

## 2012-02-12 DIAGNOSIS — N289 Disorder of kidney and ureter, unspecified: Secondary | ICD-10-CM | POA: Diagnosis present

## 2012-02-12 DIAGNOSIS — R9431 Abnormal electrocardiogram [ECG] [EKG]: Secondary | ICD-10-CM | POA: Diagnosis not present

## 2012-02-12 DIAGNOSIS — Z940 Kidney transplant status: Secondary | ICD-10-CM | POA: Diagnosis not present

## 2012-02-12 DIAGNOSIS — R0789 Other chest pain: Secondary | ICD-10-CM | POA: Diagnosis not present

## 2012-02-12 DIAGNOSIS — J449 Chronic obstructive pulmonary disease, unspecified: Secondary | ICD-10-CM | POA: Diagnosis present

## 2012-02-12 DIAGNOSIS — J189 Pneumonia, unspecified organism: Secondary | ICD-10-CM | POA: Diagnosis not present

## 2012-02-12 DIAGNOSIS — N189 Chronic kidney disease, unspecified: Secondary | ICD-10-CM | POA: Diagnosis not present

## 2012-02-12 DIAGNOSIS — Z87891 Personal history of nicotine dependence: Secondary | ICD-10-CM | POA: Diagnosis not present

## 2012-02-12 DIAGNOSIS — Z781 Physical restraint status: Secondary | ICD-10-CM | POA: Diagnosis present

## 2012-02-12 DIAGNOSIS — F29 Unspecified psychosis not due to a substance or known physiological condition: Secondary | ICD-10-CM | POA: Diagnosis not present

## 2012-02-24 DIAGNOSIS — J9600 Acute respiratory failure, unspecified whether with hypoxia or hypercapnia: Secondary | ICD-10-CM | POA: Diagnosis not present

## 2012-02-24 DIAGNOSIS — I10 Essential (primary) hypertension: Secondary | ICD-10-CM | POA: Diagnosis not present

## 2012-02-24 DIAGNOSIS — Z79899 Other long term (current) drug therapy: Secondary | ICD-10-CM | POA: Diagnosis not present

## 2012-02-24 DIAGNOSIS — Z940 Kidney transplant status: Secondary | ICD-10-CM | POA: Diagnosis not present

## 2012-02-24 DIAGNOSIS — E119 Type 2 diabetes mellitus without complications: Secondary | ICD-10-CM | POA: Diagnosis not present

## 2012-02-24 DIAGNOSIS — J189 Pneumonia, unspecified organism: Secondary | ICD-10-CM | POA: Diagnosis not present

## 2012-02-24 DIAGNOSIS — E872 Acidosis: Secondary | ICD-10-CM | POA: Diagnosis not present

## 2012-02-27 DIAGNOSIS — A419 Sepsis, unspecified organism: Secondary | ICD-10-CM | POA: Diagnosis not present

## 2012-02-27 DIAGNOSIS — Z940 Kidney transplant status: Secondary | ICD-10-CM | POA: Diagnosis not present

## 2012-02-27 DIAGNOSIS — R609 Edema, unspecified: Secondary | ICD-10-CM | POA: Diagnosis not present

## 2012-02-27 DIAGNOSIS — J9600 Acute respiratory failure, unspecified whether with hypoxia or hypercapnia: Secondary | ICD-10-CM | POA: Diagnosis not present

## 2012-02-27 DIAGNOSIS — R0989 Other specified symptoms and signs involving the circulatory and respiratory systems: Secondary | ICD-10-CM | POA: Diagnosis not present

## 2012-02-27 DIAGNOSIS — Z79899 Other long term (current) drug therapy: Secondary | ICD-10-CM | POA: Diagnosis not present

## 2012-02-27 DIAGNOSIS — E872 Acidosis: Secondary | ICD-10-CM | POA: Diagnosis not present

## 2012-02-27 DIAGNOSIS — E119 Type 2 diabetes mellitus without complications: Secondary | ICD-10-CM | POA: Diagnosis not present

## 2012-02-27 DIAGNOSIS — R6520 Severe sepsis without septic shock: Secondary | ICD-10-CM | POA: Diagnosis not present

## 2012-02-27 DIAGNOSIS — R0902 Hypoxemia: Secondary | ICD-10-CM | POA: Diagnosis not present

## 2012-02-27 DIAGNOSIS — R0609 Other forms of dyspnea: Secondary | ICD-10-CM | POA: Diagnosis not present

## 2012-02-27 DIAGNOSIS — I10 Essential (primary) hypertension: Secondary | ICD-10-CM | POA: Diagnosis not present

## 2012-03-02 DIAGNOSIS — R0902 Hypoxemia: Secondary | ICD-10-CM | POA: Diagnosis not present

## 2012-03-02 DIAGNOSIS — K8020 Calculus of gallbladder without cholecystitis without obstruction: Secondary | ICD-10-CM | POA: Diagnosis not present

## 2012-03-02 DIAGNOSIS — C61 Malignant neoplasm of prostate: Secondary | ICD-10-CM | POA: Diagnosis not present

## 2012-03-02 DIAGNOSIS — F29 Unspecified psychosis not due to a substance or known physiological condition: Secondary | ICD-10-CM | POA: Diagnosis not present

## 2012-03-02 DIAGNOSIS — K5900 Constipation, unspecified: Secondary | ICD-10-CM | POA: Diagnosis not present

## 2012-03-02 DIAGNOSIS — J9819 Other pulmonary collapse: Secondary | ICD-10-CM | POA: Diagnosis not present

## 2012-03-02 DIAGNOSIS — E139 Other specified diabetes mellitus without complications: Secondary | ICD-10-CM | POA: Diagnosis not present

## 2012-03-02 DIAGNOSIS — A419 Sepsis, unspecified organism: Secondary | ICD-10-CM | POA: Diagnosis not present

## 2012-03-02 DIAGNOSIS — Z940 Kidney transplant status: Secondary | ICD-10-CM | POA: Diagnosis not present

## 2012-03-02 DIAGNOSIS — J9600 Acute respiratory failure, unspecified whether with hypoxia or hypercapnia: Secondary | ICD-10-CM | POA: Diagnosis not present

## 2012-03-02 DIAGNOSIS — D649 Anemia, unspecified: Secondary | ICD-10-CM | POA: Diagnosis not present

## 2012-03-02 DIAGNOSIS — R0789 Other chest pain: Secondary | ICD-10-CM | POA: Diagnosis not present

## 2012-03-02 DIAGNOSIS — J189 Pneumonia, unspecified organism: Secondary | ICD-10-CM | POA: Diagnosis not present

## 2012-03-02 DIAGNOSIS — Z794 Long term (current) use of insulin: Secondary | ICD-10-CM | POA: Diagnosis not present

## 2012-03-02 DIAGNOSIS — I120 Hypertensive chronic kidney disease with stage 5 chronic kidney disease or end stage renal disease: Secondary | ICD-10-CM | POA: Diagnosis not present

## 2012-03-02 DIAGNOSIS — N529 Male erectile dysfunction, unspecified: Secondary | ICD-10-CM | POA: Diagnosis not present

## 2012-03-02 DIAGNOSIS — Z7982 Long term (current) use of aspirin: Secondary | ICD-10-CM | POA: Diagnosis not present

## 2012-03-02 DIAGNOSIS — N189 Chronic kidney disease, unspecified: Secondary | ICD-10-CM | POA: Diagnosis not present

## 2012-03-02 DIAGNOSIS — R0609 Other forms of dyspnea: Secondary | ICD-10-CM | POA: Diagnosis not present

## 2012-03-02 DIAGNOSIS — E119 Type 2 diabetes mellitus without complications: Secondary | ICD-10-CM | POA: Diagnosis not present

## 2012-03-02 DIAGNOSIS — I959 Hypotension, unspecified: Secondary | ICD-10-CM | POA: Diagnosis not present

## 2012-03-02 DIAGNOSIS — R0602 Shortness of breath: Secondary | ICD-10-CM | POA: Diagnosis not present

## 2012-03-02 DIAGNOSIS — I10 Essential (primary) hypertension: Secondary | ICD-10-CM | POA: Diagnosis not present

## 2012-03-02 DIAGNOSIS — IMO0002 Reserved for concepts with insufficient information to code with codable children: Secondary | ICD-10-CM | POA: Diagnosis not present

## 2012-03-02 DIAGNOSIS — Z79899 Other long term (current) drug therapy: Secondary | ICD-10-CM | POA: Diagnosis not present

## 2012-03-02 DIAGNOSIS — N186 End stage renal disease: Secondary | ICD-10-CM | POA: Diagnosis not present

## 2012-03-10 DIAGNOSIS — Z79899 Other long term (current) drug therapy: Secondary | ICD-10-CM | POA: Diagnosis not present

## 2012-03-10 DIAGNOSIS — Z940 Kidney transplant status: Secondary | ICD-10-CM | POA: Diagnosis not present

## 2012-03-10 DIAGNOSIS — Z48298 Encounter for aftercare following other organ transplant: Secondary | ICD-10-CM | POA: Diagnosis not present

## 2012-03-10 DIAGNOSIS — E139 Other specified diabetes mellitus without complications: Secondary | ICD-10-CM | POA: Diagnosis not present

## 2012-03-10 DIAGNOSIS — I10 Essential (primary) hypertension: Secondary | ICD-10-CM | POA: Diagnosis not present

## 2012-03-10 DIAGNOSIS — J189 Pneumonia, unspecified organism: Secondary | ICD-10-CM | POA: Diagnosis not present

## 2012-03-16 DIAGNOSIS — I10 Essential (primary) hypertension: Secondary | ICD-10-CM | POA: Diagnosis not present

## 2012-03-16 DIAGNOSIS — E119 Type 2 diabetes mellitus without complications: Secondary | ICD-10-CM | POA: Diagnosis not present

## 2012-03-16 DIAGNOSIS — Z940 Kidney transplant status: Secondary | ICD-10-CM | POA: Diagnosis not present

## 2012-03-16 DIAGNOSIS — E785 Hyperlipidemia, unspecified: Secondary | ICD-10-CM | POA: Diagnosis not present

## 2012-03-18 DIAGNOSIS — E872 Acidosis: Secondary | ICD-10-CM | POA: Diagnosis not present

## 2012-03-18 DIAGNOSIS — G4733 Obstructive sleep apnea (adult) (pediatric): Secondary | ICD-10-CM | POA: Diagnosis not present

## 2012-03-24 DIAGNOSIS — N184 Chronic kidney disease, stage 4 (severe): Secondary | ICD-10-CM | POA: Diagnosis not present

## 2012-03-24 DIAGNOSIS — E119 Type 2 diabetes mellitus without complications: Secondary | ICD-10-CM | POA: Diagnosis not present

## 2012-03-24 DIAGNOSIS — I10 Essential (primary) hypertension: Secondary | ICD-10-CM | POA: Diagnosis not present

## 2012-03-24 DIAGNOSIS — Z79899 Other long term (current) drug therapy: Secondary | ICD-10-CM | POA: Diagnosis not present

## 2012-03-24 DIAGNOSIS — M109 Gout, unspecified: Secondary | ICD-10-CM | POA: Diagnosis not present

## 2012-03-24 DIAGNOSIS — E785 Hyperlipidemia, unspecified: Secondary | ICD-10-CM | POA: Diagnosis not present

## 2012-03-24 DIAGNOSIS — N059 Unspecified nephritic syndrome with unspecified morphologic changes: Secondary | ICD-10-CM | POA: Diagnosis not present

## 2012-03-24 DIAGNOSIS — Z940 Kidney transplant status: Secondary | ICD-10-CM | POA: Diagnosis not present

## 2012-04-03 DIAGNOSIS — Z940 Kidney transplant status: Secondary | ICD-10-CM | POA: Diagnosis not present

## 2012-04-03 DIAGNOSIS — D509 Iron deficiency anemia, unspecified: Secondary | ICD-10-CM | POA: Diagnosis not present

## 2012-04-06 DIAGNOSIS — I120 Hypertensive chronic kidney disease with stage 5 chronic kidney disease or end stage renal disease: Secondary | ICD-10-CM | POA: Diagnosis not present

## 2012-04-06 DIAGNOSIS — D509 Iron deficiency anemia, unspecified: Secondary | ICD-10-CM | POA: Diagnosis not present

## 2012-04-06 DIAGNOSIS — Z940 Kidney transplant status: Secondary | ICD-10-CM | POA: Diagnosis not present

## 2012-04-14 DIAGNOSIS — Z940 Kidney transplant status: Secondary | ICD-10-CM | POA: Diagnosis not present

## 2012-04-16 DIAGNOSIS — M109 Gout, unspecified: Secondary | ICD-10-CM | POA: Diagnosis not present

## 2012-04-16 DIAGNOSIS — E119 Type 2 diabetes mellitus without complications: Secondary | ICD-10-CM | POA: Diagnosis not present

## 2012-04-16 DIAGNOSIS — Z940 Kidney transplant status: Secondary | ICD-10-CM | POA: Diagnosis not present

## 2012-04-16 DIAGNOSIS — E785 Hyperlipidemia, unspecified: Secondary | ICD-10-CM | POA: Diagnosis not present

## 2012-04-22 DIAGNOSIS — Z940 Kidney transplant status: Secondary | ICD-10-CM | POA: Diagnosis not present

## 2012-04-22 DIAGNOSIS — Z79899 Other long term (current) drug therapy: Secondary | ICD-10-CM | POA: Diagnosis not present

## 2012-04-22 DIAGNOSIS — D649 Anemia, unspecified: Secondary | ICD-10-CM | POA: Diagnosis not present

## 2012-04-22 DIAGNOSIS — M109 Gout, unspecified: Secondary | ICD-10-CM | POA: Diagnosis not present

## 2012-04-22 DIAGNOSIS — I10 Essential (primary) hypertension: Secondary | ICD-10-CM | POA: Diagnosis not present

## 2012-04-22 DIAGNOSIS — N390 Urinary tract infection, site not specified: Secondary | ICD-10-CM | POA: Diagnosis not present

## 2012-04-22 DIAGNOSIS — E119 Type 2 diabetes mellitus without complications: Secondary | ICD-10-CM | POA: Diagnosis not present

## 2012-04-28 DIAGNOSIS — G4730 Sleep apnea, unspecified: Secondary | ICD-10-CM | POA: Diagnosis not present

## 2012-04-28 DIAGNOSIS — G4733 Obstructive sleep apnea (adult) (pediatric): Secondary | ICD-10-CM | POA: Diagnosis not present

## 2012-05-04 DIAGNOSIS — Z9489 Other transplanted organ and tissue status: Secondary | ICD-10-CM | POA: Diagnosis not present

## 2012-05-04 DIAGNOSIS — N529 Male erectile dysfunction, unspecified: Secondary | ICD-10-CM | POA: Diagnosis not present

## 2012-05-04 DIAGNOSIS — C61 Malignant neoplasm of prostate: Secondary | ICD-10-CM | POA: Diagnosis not present

## 2012-05-20 DIAGNOSIS — M109 Gout, unspecified: Secondary | ICD-10-CM | POA: Diagnosis not present

## 2012-05-20 DIAGNOSIS — D649 Anemia, unspecified: Secondary | ICD-10-CM | POA: Diagnosis not present

## 2012-05-20 DIAGNOSIS — Z940 Kidney transplant status: Secondary | ICD-10-CM | POA: Diagnosis not present

## 2012-05-20 DIAGNOSIS — Z79899 Other long term (current) drug therapy: Secondary | ICD-10-CM | POA: Diagnosis not present

## 2012-05-20 DIAGNOSIS — I10 Essential (primary) hypertension: Secondary | ICD-10-CM | POA: Diagnosis not present

## 2012-05-20 DIAGNOSIS — E119 Type 2 diabetes mellitus without complications: Secondary | ICD-10-CM | POA: Diagnosis not present

## 2012-05-27 DIAGNOSIS — Z940 Kidney transplant status: Secondary | ICD-10-CM | POA: Diagnosis not present

## 2012-05-27 DIAGNOSIS — E119 Type 2 diabetes mellitus without complications: Secondary | ICD-10-CM | POA: Diagnosis not present

## 2012-05-27 DIAGNOSIS — I10 Essential (primary) hypertension: Secondary | ICD-10-CM | POA: Diagnosis not present

## 2012-05-27 DIAGNOSIS — N2581 Secondary hyperparathyroidism of renal origin: Secondary | ICD-10-CM | POA: Diagnosis not present

## 2012-07-28 DIAGNOSIS — D649 Anemia, unspecified: Secondary | ICD-10-CM | POA: Diagnosis not present

## 2012-07-28 DIAGNOSIS — I10 Essential (primary) hypertension: Secondary | ICD-10-CM | POA: Diagnosis not present

## 2012-07-28 DIAGNOSIS — E119 Type 2 diabetes mellitus without complications: Secondary | ICD-10-CM | POA: Diagnosis not present

## 2012-07-28 DIAGNOSIS — N2581 Secondary hyperparathyroidism of renal origin: Secondary | ICD-10-CM | POA: Diagnosis not present

## 2012-07-28 DIAGNOSIS — M109 Gout, unspecified: Secondary | ICD-10-CM | POA: Diagnosis not present

## 2012-07-28 DIAGNOSIS — Z79899 Other long term (current) drug therapy: Secondary | ICD-10-CM | POA: Diagnosis not present

## 2012-07-28 DIAGNOSIS — Z940 Kidney transplant status: Secondary | ICD-10-CM | POA: Diagnosis not present

## 2012-08-03 DIAGNOSIS — I10 Essential (primary) hypertension: Secondary | ICD-10-CM | POA: Diagnosis not present

## 2012-08-03 DIAGNOSIS — Z940 Kidney transplant status: Secondary | ICD-10-CM | POA: Diagnosis not present

## 2012-08-03 DIAGNOSIS — E119 Type 2 diabetes mellitus without complications: Secondary | ICD-10-CM | POA: Diagnosis not present

## 2012-08-03 DIAGNOSIS — N2581 Secondary hyperparathyroidism of renal origin: Secondary | ICD-10-CM | POA: Diagnosis not present

## 2012-09-02 DIAGNOSIS — I10 Essential (primary) hypertension: Secondary | ICD-10-CM | POA: Diagnosis not present

## 2012-09-02 DIAGNOSIS — M109 Gout, unspecified: Secondary | ICD-10-CM | POA: Diagnosis not present

## 2012-09-02 DIAGNOSIS — D649 Anemia, unspecified: Secondary | ICD-10-CM | POA: Diagnosis not present

## 2012-09-02 DIAGNOSIS — Z940 Kidney transplant status: Secondary | ICD-10-CM | POA: Diagnosis not present

## 2012-09-02 DIAGNOSIS — Z79899 Other long term (current) drug therapy: Secondary | ICD-10-CM | POA: Diagnosis not present

## 2012-09-02 DIAGNOSIS — E119 Type 2 diabetes mellitus without complications: Secondary | ICD-10-CM | POA: Diagnosis not present

## 2012-09-30 DIAGNOSIS — Z79899 Other long term (current) drug therapy: Secondary | ICD-10-CM | POA: Diagnosis not present

## 2012-09-30 DIAGNOSIS — M109 Gout, unspecified: Secondary | ICD-10-CM | POA: Diagnosis not present

## 2012-09-30 DIAGNOSIS — Z940 Kidney transplant status: Secondary | ICD-10-CM | POA: Diagnosis not present

## 2012-09-30 DIAGNOSIS — E119 Type 2 diabetes mellitus without complications: Secondary | ICD-10-CM | POA: Diagnosis not present

## 2012-09-30 DIAGNOSIS — I10 Essential (primary) hypertension: Secondary | ICD-10-CM | POA: Diagnosis not present

## 2012-09-30 DIAGNOSIS — D649 Anemia, unspecified: Secondary | ICD-10-CM | POA: Diagnosis not present

## 2012-10-13 DIAGNOSIS — Z23 Encounter for immunization: Secondary | ICD-10-CM | POA: Diagnosis not present

## 2012-10-13 DIAGNOSIS — E785 Hyperlipidemia, unspecified: Secondary | ICD-10-CM | POA: Diagnosis not present

## 2012-10-13 DIAGNOSIS — E213 Hyperparathyroidism, unspecified: Secondary | ICD-10-CM | POA: Diagnosis not present

## 2012-10-13 DIAGNOSIS — E669 Obesity, unspecified: Secondary | ICD-10-CM | POA: Diagnosis not present

## 2012-10-13 DIAGNOSIS — E119 Type 2 diabetes mellitus without complications: Secondary | ICD-10-CM | POA: Diagnosis not present

## 2012-10-13 DIAGNOSIS — N2581 Secondary hyperparathyroidism of renal origin: Secondary | ICD-10-CM | POA: Diagnosis not present

## 2012-10-13 DIAGNOSIS — Z940 Kidney transplant status: Secondary | ICD-10-CM | POA: Diagnosis not present

## 2012-10-13 DIAGNOSIS — I10 Essential (primary) hypertension: Secondary | ICD-10-CM | POA: Diagnosis not present

## 2012-11-02 DIAGNOSIS — Z940 Kidney transplant status: Secondary | ICD-10-CM | POA: Diagnosis not present

## 2012-11-09 DIAGNOSIS — Z9489 Other transplanted organ and tissue status: Secondary | ICD-10-CM | POA: Diagnosis not present

## 2012-11-09 DIAGNOSIS — R972 Elevated prostate specific antigen [PSA]: Secondary | ICD-10-CM | POA: Diagnosis not present

## 2012-11-09 DIAGNOSIS — C61 Malignant neoplasm of prostate: Secondary | ICD-10-CM | POA: Diagnosis not present

## 2012-11-09 DIAGNOSIS — N529 Male erectile dysfunction, unspecified: Secondary | ICD-10-CM | POA: Diagnosis not present

## 2012-11-16 DIAGNOSIS — Z940 Kidney transplant status: Secondary | ICD-10-CM | POA: Diagnosis not present

## 2012-11-16 DIAGNOSIS — E213 Hyperparathyroidism, unspecified: Secondary | ICD-10-CM | POA: Diagnosis not present

## 2012-11-18 DIAGNOSIS — E11319 Type 2 diabetes mellitus with unspecified diabetic retinopathy without macular edema: Secondary | ICD-10-CM | POA: Diagnosis not present

## 2012-11-18 DIAGNOSIS — H35379 Puckering of macula, unspecified eye: Secondary | ICD-10-CM | POA: Diagnosis not present

## 2012-11-18 DIAGNOSIS — E119 Type 2 diabetes mellitus without complications: Secondary | ICD-10-CM | POA: Diagnosis not present

## 2012-11-18 DIAGNOSIS — H2510 Age-related nuclear cataract, unspecified eye: Secondary | ICD-10-CM | POA: Diagnosis not present

## 2012-12-01 DIAGNOSIS — E11329 Type 2 diabetes mellitus with mild nonproliferative diabetic retinopathy without macular edema: Secondary | ICD-10-CM | POA: Diagnosis not present

## 2012-12-01 DIAGNOSIS — E1039 Type 1 diabetes mellitus with other diabetic ophthalmic complication: Secondary | ICD-10-CM | POA: Diagnosis not present

## 2012-12-01 DIAGNOSIS — E11311 Type 2 diabetes mellitus with unspecified diabetic retinopathy with macular edema: Secondary | ICD-10-CM | POA: Diagnosis not present

## 2012-12-01 DIAGNOSIS — H33019 Retinal detachment with single break, unspecified eye: Secondary | ICD-10-CM | POA: Diagnosis not present

## 2012-12-08 DIAGNOSIS — I10 Essential (primary) hypertension: Secondary | ICD-10-CM | POA: Diagnosis not present

## 2012-12-08 DIAGNOSIS — Z940 Kidney transplant status: Secondary | ICD-10-CM | POA: Diagnosis not present

## 2012-12-08 DIAGNOSIS — Z794 Long term (current) use of insulin: Secondary | ICD-10-CM | POA: Diagnosis not present

## 2012-12-08 DIAGNOSIS — E119 Type 2 diabetes mellitus without complications: Secondary | ICD-10-CM | POA: Diagnosis not present

## 2012-12-08 DIAGNOSIS — Z79899 Other long term (current) drug therapy: Secondary | ICD-10-CM | POA: Diagnosis not present

## 2012-12-08 DIAGNOSIS — Z7982 Long term (current) use of aspirin: Secondary | ICD-10-CM | POA: Diagnosis not present

## 2012-12-08 DIAGNOSIS — Z792 Long term (current) use of antibiotics: Secondary | ICD-10-CM | POA: Diagnosis not present

## 2012-12-18 DIAGNOSIS — E11329 Type 2 diabetes mellitus with mild nonproliferative diabetic retinopathy without macular edema: Secondary | ICD-10-CM | POA: Diagnosis not present

## 2012-12-21 DIAGNOSIS — Z940 Kidney transplant status: Secondary | ICD-10-CM | POA: Diagnosis not present

## 2012-12-23 DIAGNOSIS — L0291 Cutaneous abscess, unspecified: Secondary | ICD-10-CM

## 2012-12-23 DIAGNOSIS — L02219 Cutaneous abscess of trunk, unspecified: Secondary | ICD-10-CM

## 2012-12-23 HISTORY — DX: Cutaneous abscess of trunk, unspecified: L02.219

## 2012-12-23 HISTORY — DX: Cutaneous abscess, unspecified: L02.91

## 2013-01-18 DIAGNOSIS — N2581 Secondary hyperparathyroidism of renal origin: Secondary | ICD-10-CM | POA: Diagnosis not present

## 2013-01-18 DIAGNOSIS — E669 Obesity, unspecified: Secondary | ICD-10-CM | POA: Diagnosis not present

## 2013-01-18 DIAGNOSIS — I10 Essential (primary) hypertension: Secondary | ICD-10-CM | POA: Diagnosis not present

## 2013-01-18 DIAGNOSIS — E785 Hyperlipidemia, unspecified: Secondary | ICD-10-CM | POA: Diagnosis not present

## 2013-01-18 DIAGNOSIS — E213 Hyperparathyroidism, unspecified: Secondary | ICD-10-CM | POA: Diagnosis not present

## 2013-01-18 DIAGNOSIS — M109 Gout, unspecified: Secondary | ICD-10-CM | POA: Diagnosis not present

## 2013-01-18 DIAGNOSIS — E119 Type 2 diabetes mellitus without complications: Secondary | ICD-10-CM | POA: Diagnosis not present

## 2013-01-18 DIAGNOSIS — Z940 Kidney transplant status: Secondary | ICD-10-CM | POA: Diagnosis not present

## 2013-01-25 DIAGNOSIS — Z940 Kidney transplant status: Secondary | ICD-10-CM | POA: Diagnosis not present

## 2013-02-16 DIAGNOSIS — Z940 Kidney transplant status: Secondary | ICD-10-CM | POA: Diagnosis not present

## 2013-03-03 ENCOUNTER — Other Ambulatory Visit: Payer: Self-pay | Admitting: Family Medicine

## 2013-03-04 ENCOUNTER — Ambulatory Visit
Admission: RE | Admit: 2013-03-04 | Discharge: 2013-03-04 | Disposition: A | Discharge status: Home / Self Care | Payer: Self-pay | Source: Ambulatory Visit | Attending: Family Medicine | Admitting: Family Medicine

## 2013-03-04 DIAGNOSIS — L0291 Cutaneous abscess, unspecified: Secondary | ICD-10-CM

## 2013-03-04 DIAGNOSIS — R935 Abnormal findings on diagnostic imaging of other abdominal regions, including retroperitoneum: Secondary | ICD-10-CM | POA: Diagnosis not present

## 2013-03-09 ENCOUNTER — Other Ambulatory Visit (HOSPITAL_COMMUNITY): Payer: Self-pay | Admitting: Family Medicine

## 2013-03-10 ENCOUNTER — Other Ambulatory Visit: Payer: Self-pay | Admitting: Radiology

## 2013-03-10 ENCOUNTER — Encounter (HOSPITAL_COMMUNITY): Payer: Self-pay | Admitting: Pharmacy Technician

## 2013-03-11 ENCOUNTER — Ambulatory Visit (HOSPITAL_COMMUNITY)
Admission: RE | Admit: 2013-03-11 | Discharge: 2013-03-11 | Disposition: A | Discharge status: Home / Self Care | Payer: Medicare Other | Source: Ambulatory Visit | Attending: Family Medicine | Admitting: Family Medicine

## 2013-03-11 DIAGNOSIS — E119 Type 2 diabetes mellitus without complications: Secondary | ICD-10-CM | POA: Diagnosis not present

## 2013-03-11 DIAGNOSIS — C61 Malignant neoplasm of prostate: Secondary | ICD-10-CM | POA: Diagnosis not present

## 2013-03-11 DIAGNOSIS — Z940 Kidney transplant status: Secondary | ICD-10-CM | POA: Diagnosis not present

## 2013-03-11 DIAGNOSIS — L02219 Cutaneous abscess of trunk, unspecified: Secondary | ICD-10-CM | POA: Insufficient documentation

## 2013-03-11 DIAGNOSIS — Z87891 Personal history of nicotine dependence: Secondary | ICD-10-CM | POA: Insufficient documentation

## 2013-03-11 DIAGNOSIS — Z79899 Other long term (current) drug therapy: Secondary | ICD-10-CM | POA: Diagnosis not present

## 2013-03-11 DIAGNOSIS — Z794 Long term (current) use of insulin: Secondary | ICD-10-CM | POA: Diagnosis not present

## 2013-03-11 DIAGNOSIS — E7800 Pure hypercholesterolemia, unspecified: Secondary | ICD-10-CM | POA: Insufficient documentation

## 2013-03-11 DIAGNOSIS — M109 Gout, unspecified: Secondary | ICD-10-CM | POA: Insufficient documentation

## 2013-03-11 DIAGNOSIS — Z7982 Long term (current) use of aspirin: Secondary | ICD-10-CM | POA: Insufficient documentation

## 2013-03-11 LAB — BASIC METABOLIC PANEL
BUN: 41 mg/dL — ABNORMAL HIGH (ref 6–23)
CO2: 30 mEq/L (ref 19–32)
Chloride: 101 mEq/L (ref 96–112)
Creatinine, Ser: 1.82 mg/dL — ABNORMAL HIGH (ref 0.50–1.35)

## 2013-03-11 LAB — PROTIME-INR: Prothrombin Time: 13.2 seconds (ref 11.6–15.2)

## 2013-03-11 LAB — CBC
MCH: 27.9 pg (ref 26.0–34.0)
MCV: 89.5 fL (ref 78.0–100.0)
Platelets: 245 10*3/uL (ref 150–400)
RDW: 14.4 % (ref 11.5–15.5)

## 2013-03-11 MED ORDER — MIDAZOLAM HCL 2 MG/2ML IJ SOLN
INTRAMUSCULAR | Status: AC
  Filled 2013-03-11: qty 6

## 2013-03-11 MED ORDER — SODIUM CHLORIDE 0.9 % IV SOLN: INTRAVENOUS | Status: DC

## 2013-03-11 MED ORDER — FENTANYL CITRATE 0.05 MG/ML IJ SOLN
INTRAMUSCULAR | Status: AC
  Filled 2013-03-11: qty 4

## 2013-03-11 NOTE — Progress Notes (Signed)
Discharged home ambulatory with spouse, encouraged to change dressing in am

## 2013-03-11 NOTE — H&P (Signed)
Russell Bowers is an 62 y.o. male.   Chief Complaint: lower back abscess HPI: Patient with history of renal transplant ,on antirejection meds, low back pain and evidence of a complex subcutaneous fluid collection/abscess on recent US presents today for US guided aspiration of the fluid collection .  PMH: DM, gout, prior ESRD(now with renal transplant), prostate cancer, colitis, elevated cholesterol PSH: renal transplant, vasc access procedures, prostatectomy No family history on file. Social History: former smoker, occ alcohol use, married, accountant Allergies: No Known Allergies  Current outpatient prescriptions:allopurinol (ZYLOPRIM) 300 MG tablet, Take 300 mg by mouth daily., Disp: , Rfl: ;  aspirin EC 81 MG tablet, Take 81 mg by mouth every other day., Disp: , Rfl: ;  calcitRIOL (ROCALTROL) 0.25 MCG capsule, Take 0.25-0.5 mcg by mouth daily. Take 1 tablet day 1, take 2 tablet day 2, take 1 tablet day 3, and continue accordingly, Disp: , Rfl: ;  cetirizine (ZYRTEC) 10 MG tablet, Take 10 mg by mouth daily., Disp: , Rfl:  cinacalcet (SENSIPAR) 30 MG tablet, Take 30 mg by mouth daily., Disp: , Rfl: ;  diphenhydrAMINE (BENADRYL) 25 MG tablet, Take 25 mg by mouth at bedtime as needed for itching or sleep., Disp: , Rfl: ;  furosemide (LASIX) 80 MG tablet, Take 80 mg by mouth 3 (three) times daily., Disp: , Rfl: ;  glipiZIDE (GLUCOTROL) 10 MG tablet, Take 10 mg by mouth 2 (two) times daily before a meal., Disp: , Rfl:  insulin glargine (LANTUS) 100 UNIT/ML injection, Inject 12 Units into the skin at bedtime., Disp: , Rfl: ;  insulin regular (NOVOLIN R,HUMULIN R) 100 units/mL injection, Inject 2-8 Units into the skin 3 (three) times daily before meals. Per sliding scale, Disp: , Rfl: ;  metoprolol (LOPRESSOR) 50 MG tablet, Take 25 mg by mouth 2 (two) times daily., Disp: , Rfl:  mycophenolate (MYFORTIC) 180 MG EC tablet, Take 360 mg by mouth 2 (two) times daily., Disp: , Rfl: ;  omeprazole (PRILOSEC) 20  MG capsule, Take 20 mg by mouth daily., Disp: , Rfl: ;  pravastatin (PRAVACHOL) 80 MG tablet, Take 80 mg by mouth daily., Disp: , Rfl: ;  tacrolimus (PROGRAF) 1 MG capsule, Take 3 mg by mouth 2 (two) times daily., Disp: , Rfl:  Current facility-administered medications:0.9 %  sodium chloride infusion, , Intravenous, Continuous, Ascencion Dike, PA-C  Results for orders placed during the hospital encounter of 03/11/13  CBC      Result Value Range   WBC 10.2  4.0 - 10.5 K/uL   RBC 4.87  4.22 - 5.81 MIL/uL   Hemoglobin 13.6  13.0 - 17.0 g/dL   HCT 43.6  39.0 - 52.0 %   MCV 89.5  78.0 - 100.0 fL   MCH 27.9  26.0 - 34.0 pg   MCHC 31.2  30.0 - 36.0 g/dL   RDW 14.4  11.5 - 15.5 %   Platelets 245  150 - 400 K/uL    Review of Systems  Constitutional: Negative for fever and chills.  Respiratory: Negative for cough and shortness of breath.   Cardiovascular: Negative for chest pain.  Gastrointestinal: Negative for nausea, vomiting and abdominal pain.  Musculoskeletal: Positive for back pain.  Neurological: Negative for headaches.  Endo/Heme/Allergies: Does not bruise/bleed easily.   Vitals:  BP 129/66  HR 83  TEMP 98.2  O2 SATS 97%RA Physical Exam  Constitutional: He is oriented to person, place, and time. He appears well-developed and well-nourished.  Cardiovascular: Normal rate and regular  rhythm.   Respiratory: Effort normal.  Sl dim BS left base, right clear  GI: Soft. Bowel sounds are normal. There is no tenderness.  Musculoskeletal: Normal range of motion. He exhibits no edema.   sl indurated, erythematous area noted on mid lower back region, tender to palpation but without obvious drainage today  Neurological: He is alert and oriented to person, place, and time.     Assessment/Plan Pt with SQ fluid collection/abscess mid lower back region. Plan is for US guided aspiration of collection today. Details/risks of procedure d/w pt/wife with their understanding and consent.  Russell Bowers,D  KEVIN 03/11/2013, 1:15 PM

## 2013-03-11 NOTE — Procedures (Signed)
Technically successful US guided aspiration of approximately 2 cc of purulent material from subcutaneous abscess within the midline of the back.  Samples sent to lab for analysis.  No immediate complications.

## 2013-03-14 LAB — CULTURE, ROUTINE-ABSCESS

## 2013-03-16 ENCOUNTER — Ambulatory Visit (INDEPENDENT_AMBULATORY_CARE_PROVIDER_SITE_OTHER): Payer: Medicare Other | Admitting: General Surgery

## 2013-03-16 ENCOUNTER — Encounter (INDEPENDENT_AMBULATORY_CARE_PROVIDER_SITE_OTHER): Payer: Self-pay | Admitting: General Surgery

## 2013-03-16 VITALS — BP 116/74 | HR 84 | Temp 97.6°F | Resp 16 | Ht 69.0 in | Wt 218.8 lb

## 2013-03-16 DIAGNOSIS — L02212 Cutaneous abscess of back [any part, except buttock]: Secondary | ICD-10-CM

## 2013-03-16 DIAGNOSIS — L02219 Cutaneous abscess of trunk, unspecified: Secondary | ICD-10-CM

## 2013-03-16 DIAGNOSIS — L03319 Cellulitis of trunk, unspecified: Secondary | ICD-10-CM | POA: Diagnosis not present

## 2013-03-16 MED ORDER — CLINDAMYCIN HCL 300 MG PO CAPS: 300.0000 mg | ORAL_CAPSULE | Freq: Four times a day (QID) | ORAL | Status: DC

## 2013-03-16 MED ORDER — HYDROCODONE-ACETAMINOPHEN 10-325 MG PO TABS: 1.0000 | ORAL_TABLET | Freq: Four times a day (QID) | ORAL | Status: DC | PRN

## 2013-03-16 NOTE — Progress Notes (Signed)
Patient ID: Russell Bowers, male   DOB: January 04, 1951, 62 y.o.   MRN: [id number]  Chief Complaint  Patient presents with  . Cyst    lower back asbcess    HPI Russell Bowers is a 62 y.o. male. He was referred for evaluation of a lower back abscess which has been present for 2 months.  He has tried antibiotics and had US guided aspiration without relief.  His wife has also been expressing some purulent material as well.  He is scheduled to have IV antibiotics for 10 days.  He is immune compromised due to renal transplant 1 year ago.    HPI  Past Medical History  Diagnosis Date  . Diabetes mellitus without complication   . Cancer     Prostate cancer  . Renal failure   . Detached retina   . Hypertension   . Cellulitis and abscess of unspecified site 2014    lower back  . ADHD (attention deficit hyperactivity disorder)   . Asthma     Past Surgical History  Procedure Laterality Date  . Kidney transplant  12/01/2011  . Prostectomy  11/2010  . Retinal detachment surgery  06/2002    Family History  Problem Relation Age of Onset  . Diabetes Mother     Social History History  Substance Use Topics  . Smoking status: Former Smoker    Quit date: 03/16/1993  . Smokeless tobacco: Not on file  . Alcohol Use: 0.0 oz/week    1-2 Glasses of wine per week    No Known Allergies  Current Outpatient Prescriptions  Medication Sig Dispense Refill  . allopurinol (ZYLOPRIM) 300 MG tablet Take 300 mg by mouth daily.      . calcitRIOL (ROCALTROL) 0.25 MCG capsule Take 0.25-0.5 mcg by mouth daily. Take 1 tablet day 1, take 2 tablet day 2, take 1 tablet day 3, and continue accordingly      . cetirizine (ZYRTEC) 10 MG tablet Take 10 mg by mouth daily.      . cinacalcet (SENSIPAR) 30 MG tablet Take 30 mg by mouth daily.      . diphenhydrAMINE (BENADRYL) 25 MG tablet Take 25 mg by mouth at bedtime as needed for itching or sleep.      . furosemide (LASIX) 80 MG tablet Take 80 mg by mouth 3  (three) times daily.      [name] glipiZIDE (GLUCOTROL) 10 MG tablet Take 10 mg by mouth 2 (two) times daily before a meal.      . insulin glargine (LANTUS) 100 UNIT/ML injection Inject 12 Units into the skin at bedtime.      . insulin regular (NOVOLIN R,HUMULIN R) 100 units/mL injection Inject 2-8 Units into the skin 3 (three) times daily before meals. Per sliding scale      . metoprolol (LOPRESSOR) 50 MG tablet Take 25 mg by mouth 2 (two) times daily.      . mycophenolate (MYFORTIC) 180 MG EC tablet Take 360 mg by mouth 2 (two) times daily.      [name] omeprazole (PRILOSEC) 20 MG capsule Take 20 mg by mouth daily.      . pravastatin (PRAVACHOL) 80 MG tablet Take 80 mg by mouth daily.      . tacrolimus (PROGRAF) 1 MG capsule Take 3 mg by mouth 2 (two) times daily.      [name] aspirin EC 81 MG tablet Take 81 mg by mouth every other day.       No current facility-administered  medications for this visit.    Review of Systems Review of Systems All other review of systems negative or noncontributory except as stated in the HPI  Blood pressure 116/74, pulse 84, temperature 97.6 F (36.4 C), temperature source Temporal, resp. rate 16, height 5\' 9"  (1.753 m), weight 218 lb 12.8 oz (99.247 kg).  Physical Exam Physical Exam  Vitals reviewed. Constitutional: He is oriented to person, place, and time. He appears well-developed and well-nourished. No distress.  HENT:  Head: Normocephalic and atraumatic.  Eyes: Conjunctivae are normal. Pupils are equal, round, and reactive to light.  Neck: Normal range of motion.  Cardiovascular: Normal rate and regular rhythm.   Pulmonary/Chest: Effort normal.  Abdominal: Soft. He exhibits no distension. There is no tenderness.  Musculoskeletal: Normal range of motion.  Neurological: He is alert and oriented to person, place, and time.  Skin: He is not diaphoretic.  3cm area of induration in lower back just above the belt with central fluctuance and purulent drainage.    Psychiatric: He has a normal mood and affect. His behavior is normal. Thought content normal.    Data Reviewed   Assessment    Back abscess I think that the best treatment for this would be incision and drainage and delayed excision after the acute inflammation and infection resolves. After informed consent was obtained the area was prepped with chlorhexidine and injected with 6 cc of 1% lidocaine with epinephrine. An elliptical incision was made in over the area of fluctuance in room material was expressed and the wound was irrigated and packed with quarter inch Nu Gauze and dressing applied he tolerated the procedure well. No apparent consultations. We will put him on 5 days of clindamycin and we will see him back in about 2 weeks and he will continue with twice daily wet to dry wound care.       Plan    F/u in 2 weeks.  I would wait and see how this resolves to incision and drainage prior to starting him on IV antibiotics        Breckyn Troyer DAVID 03/16/2013, 3:09 PM

## 2013-03-23 DIAGNOSIS — R7989 Other specified abnormal findings of blood chemistry: Secondary | ICD-10-CM

## 2013-03-23 HISTORY — DX: Other specified abnormal findings of blood chemistry: R79.89

## 2013-03-26 DIAGNOSIS — H43819 Vitreous degeneration, unspecified eye: Secondary | ICD-10-CM | POA: Diagnosis not present

## 2013-03-26 DIAGNOSIS — E1039 Type 1 diabetes mellitus with other diabetic ophthalmic complication: Secondary | ICD-10-CM | POA: Diagnosis not present

## 2013-03-26 DIAGNOSIS — E11329 Type 2 diabetes mellitus with mild nonproliferative diabetic retinopathy without macular edema: Secondary | ICD-10-CM | POA: Diagnosis not present

## 2013-03-26 DIAGNOSIS — E11311 Type 2 diabetes mellitus with unspecified diabetic retinopathy with macular edema: Secondary | ICD-10-CM | POA: Diagnosis not present

## 2013-03-30 DIAGNOSIS — E119 Type 2 diabetes mellitus without complications: Secondary | ICD-10-CM | POA: Diagnosis not present

## 2013-03-30 DIAGNOSIS — D649 Anemia, unspecified: Secondary | ICD-10-CM | POA: Diagnosis not present

## 2013-03-30 DIAGNOSIS — Z79899 Other long term (current) drug therapy: Secondary | ICD-10-CM | POA: Diagnosis not present

## 2013-03-30 DIAGNOSIS — Z940 Kidney transplant status: Secondary | ICD-10-CM | POA: Diagnosis not present

## 2013-03-30 DIAGNOSIS — N390 Urinary tract infection, site not specified: Secondary | ICD-10-CM | POA: Diagnosis not present

## 2013-04-01 ENCOUNTER — Ambulatory Visit (INDEPENDENT_AMBULATORY_CARE_PROVIDER_SITE_OTHER): Payer: Medicare Other | Admitting: General Surgery

## 2013-04-01 ENCOUNTER — Encounter (INDEPENDENT_AMBULATORY_CARE_PROVIDER_SITE_OTHER): Payer: Self-pay | Admitting: General Surgery

## 2013-04-01 VITALS — BP 122/74 | HR 88 | Temp 97.7°F | Resp 16 | Ht 69.0 in | Wt 220.8 lb

## 2013-04-01 DIAGNOSIS — L02212 Cutaneous abscess of back [any part, except buttock]: Secondary | ICD-10-CM

## 2013-04-01 DIAGNOSIS — L03319 Cellulitis of trunk, unspecified: Secondary | ICD-10-CM | POA: Diagnosis not present

## 2013-04-01 DIAGNOSIS — L02219 Cutaneous abscess of trunk, unspecified: Secondary | ICD-10-CM | POA: Diagnosis not present

## 2013-04-01 NOTE — Progress Notes (Signed)
Subjective:     Patient ID: CASSIEL SHARPLEY, male   DOB: 1951/08/18, 62 y.o.   MRN: [id number]  HPI This patient follows up 2 weeks status post incision and drainage of lower back abscess. He no longer has any pain in the area and no longer has any fevers or chills or redness. He is off all antibiotics. He is doing daily dressing changes and has no complaints.  Review of Systems     Objective:   Physical Exam His wound is healing nicely he does still have some residual induration around the area which I think is chronic in nature. There is no erythema or tenderness or drainage. He still has an opening which I repacked today and he tolerated well.    Assessment:     History of back abscess the back wound-doing well His wound is healing nicely and has no residual sign of infection. I think that this will heal in another 2 weeks and a Hasson call me at that time and we can discuss possible elective excision to prevent recurrent. In the meantime, he will continue with daily wet-to-dry dressing changes.     Plan:     Continue wet to dry dressing changes and followup with me again in 2-3 weeks to discuss elective excision

## 2013-04-05 ENCOUNTER — Ambulatory Visit
Admission: RE | Admit: 2013-04-05 | Discharge: 2013-04-05 | Disposition: A | Discharge status: Home / Self Care | Payer: Medicare Other | Source: Ambulatory Visit | Attending: Nephrology | Admitting: Nephrology

## 2013-04-05 ENCOUNTER — Other Ambulatory Visit: Payer: Self-pay | Admitting: Nephrology

## 2013-04-05 DIAGNOSIS — J9819 Other pulmonary collapse: Secondary | ICD-10-CM | POA: Diagnosis not present

## 2013-04-05 DIAGNOSIS — R079 Chest pain, unspecified: Secondary | ICD-10-CM

## 2013-04-05 DIAGNOSIS — E785 Hyperlipidemia, unspecified: Secondary | ICD-10-CM | POA: Diagnosis not present

## 2013-04-05 DIAGNOSIS — E669 Obesity, unspecified: Secondary | ICD-10-CM | POA: Diagnosis not present

## 2013-04-05 DIAGNOSIS — E119 Type 2 diabetes mellitus without complications: Secondary | ICD-10-CM | POA: Diagnosis not present

## 2013-04-05 DIAGNOSIS — I10 Essential (primary) hypertension: Secondary | ICD-10-CM | POA: Diagnosis not present

## 2013-04-05 DIAGNOSIS — Z940 Kidney transplant status: Secondary | ICD-10-CM | POA: Diagnosis not present

## 2013-04-08 DIAGNOSIS — R079 Chest pain, unspecified: Secondary | ICD-10-CM | POA: Diagnosis not present

## 2013-04-08 DIAGNOSIS — R011 Cardiac murmur, unspecified: Secondary | ICD-10-CM | POA: Diagnosis not present

## 2013-04-08 DIAGNOSIS — R0602 Shortness of breath: Secondary | ICD-10-CM | POA: Diagnosis not present

## 2013-04-11 ENCOUNTER — Inpatient Hospital Stay (HOSPITAL_COMMUNITY)
Admission: EM | Admit: 2013-04-11 | Discharge: 2013-04-21 | DRG: 208 | Disposition: A | Discharge status: Home / Self Care | Payer: Medicare Other | Attending: Pulmonary Disease | Admitting: Pulmonary Disease

## 2013-04-11 ENCOUNTER — Emergency Department (HOSPITAL_COMMUNITY): Payer: Medicare Other

## 2013-04-11 ENCOUNTER — Inpatient Hospital Stay (HOSPITAL_COMMUNITY): Payer: Medicare Other

## 2013-04-11 ENCOUNTER — Encounter (HOSPITAL_COMMUNITY): Payer: Self-pay | Admitting: Cardiology

## 2013-04-11 DIAGNOSIS — E872 Acidosis, unspecified: Secondary | ICD-10-CM | POA: Diagnosis present

## 2013-04-11 DIAGNOSIS — IMO0002 Reserved for concepts with insufficient information to code with codable children: Secondary | ICD-10-CM

## 2013-04-11 DIAGNOSIS — B954 Other streptococcus as the cause of diseases classified elsewhere: Secondary | ICD-10-CM | POA: Diagnosis not present

## 2013-04-11 DIAGNOSIS — E873 Alkalosis: Secondary | ICD-10-CM | POA: Diagnosis not present

## 2013-04-11 DIAGNOSIS — Z87891 Personal history of nicotine dependence: Secondary | ICD-10-CM | POA: Diagnosis not present

## 2013-04-11 DIAGNOSIS — G9340 Encephalopathy, unspecified: Secondary | ICD-10-CM | POA: Diagnosis present

## 2013-04-11 DIAGNOSIS — I509 Heart failure, unspecified: Secondary | ICD-10-CM | POA: Diagnosis not present

## 2013-04-11 DIAGNOSIS — D849 Immunodeficiency, unspecified: Secondary | ICD-10-CM | POA: Diagnosis not present

## 2013-04-11 DIAGNOSIS — N179 Acute kidney failure, unspecified: Secondary | ICD-10-CM | POA: Diagnosis present

## 2013-04-11 DIAGNOSIS — Z794 Long term (current) use of insulin: Secondary | ICD-10-CM

## 2013-04-11 DIAGNOSIS — J189 Pneumonia, unspecified organism: Secondary | ICD-10-CM | POA: Diagnosis not present

## 2013-04-11 DIAGNOSIS — R7989 Other specified abnormal findings of blood chemistry: Secondary | ICD-10-CM

## 2013-04-11 DIAGNOSIS — R918 Other nonspecific abnormal finding of lung field: Secondary | ICD-10-CM | POA: Diagnosis present

## 2013-04-11 DIAGNOSIS — Z940 Kidney transplant status: Secondary | ICD-10-CM | POA: Diagnosis not present

## 2013-04-11 DIAGNOSIS — E669 Obesity, unspecified: Secondary | ICD-10-CM | POA: Diagnosis present

## 2013-04-11 DIAGNOSIS — R0602 Shortness of breath: Secondary | ICD-10-CM | POA: Diagnosis not present

## 2013-04-11 DIAGNOSIS — N19 Unspecified kidney failure: Secondary | ICD-10-CM

## 2013-04-11 DIAGNOSIS — E876 Hypokalemia: Secondary | ICD-10-CM | POA: Diagnosis not present

## 2013-04-11 DIAGNOSIS — I214 Non-ST elevation (NSTEMI) myocardial infarction: Secondary | ICD-10-CM | POA: Diagnosis present

## 2013-04-11 DIAGNOSIS — J14 Pneumonia due to Hemophilus influenzae: Secondary | ICD-10-CM | POA: Diagnosis not present

## 2013-04-11 DIAGNOSIS — J9600 Acute respiratory failure, unspecified whether with hypoxia or hypercapnia: Secondary | ICD-10-CM

## 2013-04-11 DIAGNOSIS — N189 Chronic kidney disease, unspecified: Secondary | ICD-10-CM | POA: Diagnosis not present

## 2013-04-11 DIAGNOSIS — J9602 Acute respiratory failure with hypercapnia: Secondary | ICD-10-CM

## 2013-04-11 DIAGNOSIS — L02219 Cutaneous abscess of trunk, unspecified: Secondary | ICD-10-CM | POA: Diagnosis not present

## 2013-04-11 DIAGNOSIS — Z7982 Long term (current) use of aspirin: Secondary | ICD-10-CM | POA: Diagnosis not present

## 2013-04-11 DIAGNOSIS — I2489 Other forms of acute ischemic heart disease: Secondary | ICD-10-CM | POA: Diagnosis present

## 2013-04-11 DIAGNOSIS — E662 Morbid (severe) obesity with alveolar hypoventilation: Secondary | ICD-10-CM | POA: Diagnosis present

## 2013-04-11 DIAGNOSIS — I5032 Chronic diastolic (congestive) heart failure: Secondary | ICD-10-CM | POA: Diagnosis present

## 2013-04-11 DIAGNOSIS — I5031 Acute diastolic (congestive) heart failure: Secondary | ICD-10-CM

## 2013-04-11 DIAGNOSIS — R0689 Other abnormalities of breathing: Secondary | ICD-10-CM

## 2013-04-11 DIAGNOSIS — E43 Unspecified severe protein-calorie malnutrition: Secondary | ICD-10-CM | POA: Diagnosis present

## 2013-04-11 DIAGNOSIS — J9819 Other pulmonary collapse: Secondary | ICD-10-CM | POA: Diagnosis not present

## 2013-04-11 DIAGNOSIS — J9601 Acute respiratory failure with hypoxia: Secondary | ICD-10-CM | POA: Diagnosis present

## 2013-04-11 DIAGNOSIS — L03319 Cellulitis of trunk, unspecified: Secondary | ICD-10-CM | POA: Diagnosis not present

## 2013-04-11 DIAGNOSIS — E1169 Type 2 diabetes mellitus with other specified complication: Secondary | ICD-10-CM | POA: Diagnosis present

## 2013-04-11 DIAGNOSIS — I5033 Acute on chronic diastolic (congestive) heart failure: Secondary | ICD-10-CM | POA: Diagnosis present

## 2013-04-11 DIAGNOSIS — I248 Other forms of acute ischemic heart disease: Secondary | ICD-10-CM | POA: Diagnosis present

## 2013-04-11 DIAGNOSIS — I4510 Unspecified right bundle-branch block: Secondary | ICD-10-CM | POA: Diagnosis present

## 2013-04-11 DIAGNOSIS — I471 Supraventricular tachycardia, unspecified: Secondary | ICD-10-CM | POA: Diagnosis not present

## 2013-04-11 DIAGNOSIS — J9620 Acute and chronic respiratory failure, unspecified whether with hypoxia or hypercapnia: Secondary | ICD-10-CM | POA: Diagnosis not present

## 2013-04-11 DIAGNOSIS — A492 Hemophilus influenzae infection, unspecified site: Secondary | ICD-10-CM

## 2013-04-11 DIAGNOSIS — A408 Other streptococcal sepsis: Secondary | ICD-10-CM

## 2013-04-11 DIAGNOSIS — E119 Type 2 diabetes mellitus without complications: Secondary | ICD-10-CM | POA: Diagnosis not present

## 2013-04-11 DIAGNOSIS — J9690 Respiratory failure, unspecified, unspecified whether with hypoxia or hypercapnia: Secondary | ICD-10-CM

## 2013-04-11 DIAGNOSIS — D72829 Elevated white blood cell count, unspecified: Secondary | ICD-10-CM

## 2013-04-11 DIAGNOSIS — J90 Pleural effusion, not elsewhere classified: Secondary | ICD-10-CM | POA: Diagnosis not present

## 2013-04-11 DIAGNOSIS — E8779 Other fluid overload: Secondary | ICD-10-CM | POA: Diagnosis not present

## 2013-04-11 DIAGNOSIS — R079 Chest pain, unspecified: Secondary | ICD-10-CM | POA: Diagnosis not present

## 2013-04-11 HISTORY — DX: Shortness of breath: R06.02

## 2013-04-11 HISTORY — DX: Other specified abnormal findings of blood chemistry: R79.89

## 2013-04-11 HISTORY — DX: Other specified abnormalities of plasma proteins: R77.8

## 2013-04-11 LAB — CBC WITH DIFFERENTIAL/PLATELET
Basophils Absolute: 0 10*3/uL (ref 0.0–0.1)
Basophils Relative: 0 % (ref 0–1)
Eosinophils Absolute: 0 10*3/uL (ref 0.0–0.7)
Lymphs Abs: 0.3 10*3/uL — ABNORMAL LOW (ref 0.7–4.0)
MCH: 29 pg (ref 26.0–34.0)
MCHC: 30.3 g/dL (ref 30.0–36.0)
Neutrophils Relative %: 92 % — ABNORMAL HIGH (ref 43–77)
Platelets: 218 10*3/uL (ref 150–400)
RBC: 5 MIL/uL (ref 4.22–5.81)
RDW: 16.7 % — ABNORMAL HIGH (ref 11.5–15.5)

## 2013-04-11 LAB — BASIC METABOLIC PANEL
GFR calc Af Amer: 27 mL/min — ABNORMAL LOW (ref >90)
GFR calc non Af Amer: 24 mL/min — ABNORMAL LOW (ref >90)
Potassium: 5 mEq/L (ref 3.5–5.1)
Sodium: 137 mEq/L (ref 135–145)

## 2013-04-11 LAB — RENAL FUNCTION PANEL
Albumin: 3 g/dL — ABNORMAL LOW (ref 3.5–5.2)
Chloride: 98 mEq/L (ref 96–112)
Creatinine, Ser: 2.66 mg/dL — ABNORMAL HIGH (ref 0.50–1.35)
GFR calc non Af Amer: 24 mL/min — ABNORMAL LOW (ref >90)
Sodium: 139 mEq/L (ref 135–145)

## 2013-04-11 LAB — URINALYSIS, ROUTINE W REFLEX MICROSCOPIC
Glucose, UA: NEGATIVE mg/dL
Ketones, ur: NEGATIVE mg/dL
Leukocytes, UA: NEGATIVE
pH: 5 (ref 5.0–8.0)

## 2013-04-11 LAB — HEPATIC FUNCTION PANEL
ALT: 68 U/L — ABNORMAL HIGH (ref 0–53)
Alkaline Phosphatase: 113 U/L (ref 39–117)
Bilirubin, Direct: 0.1 mg/dL (ref 0.0–0.3)
Indirect Bilirubin: 0.4 mg/dL (ref 0.3–0.9)

## 2013-04-11 LAB — PROTIME-INR
INR: 1.16 (ref 0.00–1.49)
Prothrombin Time: 14.6 seconds (ref 11.6–15.2)

## 2013-04-11 LAB — URINE MICROSCOPIC-ADD ON

## 2013-04-11 LAB — GLUCOSE, CAPILLARY

## 2013-04-11 LAB — APTT: aPTT: 29 seconds (ref 24–37)

## 2013-04-11 LAB — POCT I-STAT 3, ART BLOOD GAS (G3+)
Acid-Base Excess: 2 mmol/L (ref 0.0–2.0)
O2 Saturation: 93 %
TCO2: 38 mmol/L (ref 0–100)
pCO2 arterial: 60.7 mmHg (ref 35.0–45.0)
pH, Arterial: 7.311 — ABNORMAL LOW (ref 7.350–7.450)

## 2013-04-11 LAB — MRSA PCR SCREENING: MRSA by PCR: NEGATIVE

## 2013-04-11 LAB — PROCALCITONIN: Procalcitonin: 0.2 ng/mL

## 2013-04-11 LAB — CG4 I-STAT (LACTIC ACID): Lactic Acid, Venous: 1.01 mmol/L (ref 0.5–2.2)

## 2013-04-11 MED ORDER — ROCURONIUM BROMIDE 50 MG/5ML IV SOLN
INTRAVENOUS | Status: AC
  Administered 2013-04-11: 30 mg via INTRAVENOUS
  Filled 2013-04-11: qty 2

## 2013-04-11 MED ORDER — VANCOMYCIN HCL 10 G IV SOLR
1250.0000 mg | INTRAVENOUS | Status: DC
  Administered 2013-04-12 – 2013-04-13 (×2): 1250 mg via INTRAVENOUS
  Filled 2013-04-11 (×3): qty 1250

## 2013-04-11 MED ORDER — VANCOMYCIN HCL IN DEXTROSE 1-5 GM/200ML-% IV SOLN
1000.0000 mg | Freq: Once | INTRAVENOUS | Status: AC
  Administered 2013-04-11: 1000 mg via INTRAVENOUS
  Filled 2013-04-11: qty 200

## 2013-04-11 MED ORDER — MIDAZOLAM HCL 2 MG/2ML IJ SOLN: 2.0000 mg | Freq: Once | INTRAMUSCULAR | Status: AC

## 2013-04-11 MED ORDER — FUROSEMIDE 10 MG/ML IJ SOLN
40.0000 mg | INTRAMUSCULAR | Status: AC
  Administered 2013-04-11: 40 mg via INTRAVENOUS
  Filled 2013-04-11: qty 4

## 2013-04-11 MED ORDER — FENTANYL CITRATE 0.05 MG/ML IJ SOLN: 50.0000 ug | Freq: Once | INTRAMUSCULAR | Status: AC

## 2013-04-11 MED ORDER — MIDAZOLAM HCL 2 MG/2ML IJ SOLN
2.0000 mg | INTRAMUSCULAR | Status: DC | PRN
  Administered 2013-04-11: 2 mg via INTRAVENOUS
  Filled 2013-04-11: qty 2

## 2013-04-11 MED ORDER — PIPERACILLIN-TAZOBACTAM 3.375 G IVPB
3.3750 g | Freq: Once | INTRAVENOUS | Status: DC
  Administered 2013-04-11: 3.375 g via INTRAVENOUS
  Filled 2013-04-11: qty 50

## 2013-04-11 MED ORDER — ETOMIDATE 2 MG/ML IV SOLN
INTRAVENOUS | Status: AC
  Administered 2013-04-11: 20 mg via INTRAVENOUS
  Filled 2013-04-11: qty 20

## 2013-04-11 MED ORDER — DEXTROSE 5 % IV SOLN
2.0000 g | Freq: Two times a day (BID) | INTRAVENOUS | Status: DC
  Administered 2013-04-11 – 2013-04-14 (×7): 2 g via INTRAVENOUS
  Filled 2013-04-11 (×9): qty 2

## 2013-04-11 MED ORDER — SODIUM CHLORIDE 0.9 % IV SOLN: 250.0000 mL | INTRAVENOUS | Status: DC | PRN

## 2013-04-11 MED ORDER — ASPIRIN 81 MG PO CHEW
324.0000 mg | CHEWABLE_TABLET | Freq: Once | ORAL | Status: AC
  Administered 2013-04-11: 324 mg via ORAL
  Filled 2013-04-11: qty 4

## 2013-04-11 MED ORDER — TACROLIMUS 1 MG/ML ORAL SUSPENSION
3.0000 mg | Freq: Two times a day (BID) | ORAL | Status: DC
  Filled 2013-04-11: qty 3

## 2013-04-11 MED ORDER — LEVOFLOXACIN IN D5W 750 MG/150ML IV SOLN: 750.0000 mg | INTRAVENOUS | Status: DC

## 2013-04-11 MED ORDER — SODIUM CHLORIDE 0.9 % IJ SOLN
3.0000 mL | Freq: Two times a day (BID) | INTRAMUSCULAR | Status: DC
  Administered 2013-04-11 – 2013-04-13 (×5): 3 mL via INTRAVENOUS

## 2013-04-11 MED ORDER — SODIUM CHLORIDE 0.9 % IV SOLN
250.0000 mL | INTRAVENOUS | Status: DC | PRN
  Administered 2013-04-11 – 2013-04-13 (×2): 250 mL via INTRAVENOUS

## 2013-04-11 MED ORDER — MIDAZOLAM HCL 2 MG/2ML IJ SOLN
INTRAMUSCULAR | Status: AC
  Administered 2013-04-11: 2 mg via INTRAVENOUS
  Filled 2013-04-11: qty 2

## 2013-04-11 MED ORDER — MYCOPHENOLATE MOFETIL 200 MG/ML PO SUSR
360.0000 mg | Freq: Two times a day (BID) | ORAL | Status: DC
  Filled 2013-04-11: qty 1.8

## 2013-04-11 MED ORDER — PANTOPRAZOLE SODIUM 40 MG IV SOLR
40.0000 mg | INTRAVENOUS | Status: DC
  Administered 2013-04-11: 40 mg via INTRAVENOUS
  Filled 2013-04-11: qty 40

## 2013-04-11 MED ORDER — ROCURONIUM BROMIDE 50 MG/5ML IV SOLN
30.0000 mg | Freq: Once | INTRAVENOUS | Status: AC
  Filled 2013-04-11: qty 3

## 2013-04-11 MED ORDER — TACROLIMUS 1 MG PO CAPS: 3.0000 mg | ORAL_CAPSULE | Freq: Two times a day (BID) | ORAL | Status: DC

## 2013-04-11 MED ORDER — INSULIN ASPART 100 UNIT/ML ~~LOC~~ SOLN
0.0000 [IU] | SUBCUTANEOUS | Status: DC
  Administered 2013-04-11: 8 [IU] via SUBCUTANEOUS
  Administered 2013-04-11: 11 [IU] via SUBCUTANEOUS
  Administered 2013-04-12 (×2): 3 [IU] via SUBCUTANEOUS
  Administered 2013-04-13: 5 [IU] via SUBCUTANEOUS
  Administered 2013-04-13: 2 [IU] via SUBCUTANEOUS
  Administered 2013-04-13: 3 [IU] via SUBCUTANEOUS

## 2013-04-11 MED ORDER — HEPARIN SODIUM (PORCINE) 5000 UNIT/ML IJ SOLN
5000.0000 [IU] | Freq: Three times a day (TID) | INTRAMUSCULAR | Status: DC
  Administered 2013-04-11 – 2013-04-12 (×3): 5000 [IU] via SUBCUTANEOUS
  Filled 2013-04-11 (×5): qty 1

## 2013-04-11 MED ORDER — TACROLIMUS 1 MG PO CAPS
3.0000 mg | ORAL_CAPSULE | Freq: Two times a day (BID) | ORAL | Status: DC
  Administered 2013-04-11 – 2013-04-21 (×20): 3 mg via ORAL
  Filled 2013-04-11: qty 1
  Filled 2013-04-11 (×4): qty 3
  Filled 2013-04-11: qty 1
  Filled 2013-04-11 (×19): qty 3

## 2013-04-11 MED ORDER — MYCOPHENOLATE SODIUM 180 MG PO TBEC: 360.0000 mg | DELAYED_RELEASE_TABLET | Freq: Two times a day (BID) | ORAL | Status: DC

## 2013-04-11 MED ORDER — SUCCINYLCHOLINE CHLORIDE 20 MG/ML IJ SOLN
INTRAMUSCULAR | Status: AC
  Filled 2013-04-11: qty 1

## 2013-04-11 MED ORDER — FENTANYL CITRATE 0.05 MG/ML IJ SOLN
50.0000 ug | INTRAMUSCULAR | Status: DC | PRN
  Administered 2013-04-11: 100 ug via INTRAVENOUS
  Filled 2013-04-11: qty 2

## 2013-04-11 MED ORDER — MYCOPHENOLATE MOFETIL 200 MG/ML PO SUSR
360.0000 mg | Freq: Two times a day (BID) | ORAL | Status: DC
  Administered 2013-04-12 – 2013-04-13 (×3): 360 mg
  Filled 2013-04-11 (×4): qty 1.8

## 2013-04-11 MED ORDER — ETOMIDATE 2 MG/ML IV SOLN: 20.0000 mg | Freq: Once | INTRAVENOUS | Status: AC

## 2013-04-11 MED ORDER — LIDOCAINE HCL (CARDIAC) 20 MG/ML IV SOLN
INTRAVENOUS | Status: AC
  Filled 2013-04-11: qty 5

## 2013-04-11 MED ORDER — SODIUM CHLORIDE 0.9 % IJ SOLN: 3.0000 mL | INTRAMUSCULAR | Status: DC | PRN

## 2013-04-11 MED ORDER — FENTANYL CITRATE 0.05 MG/ML IJ SOLN
INTRAMUSCULAR | Status: AC
  Administered 2013-04-11: 50 ug via INTRAVENOUS
  Filled 2013-04-11: qty 2

## 2013-04-11 MED ORDER — LEVOFLOXACIN IN D5W 750 MG/150ML IV SOLN
750.0000 mg | Freq: Once | INTRAVENOUS | Status: AC
  Administered 2013-04-11: 750 mg via INTRAVENOUS
  Filled 2013-04-11: qty 150

## 2013-04-11 NOTE — Progress Notes (Addendum)
ANTIBIOTIC CONSULT NOTE - INITIAL  Pharmacy Consult for Russell Bowers / Vancomycin / Levaquin Indication: rule out sepsis  No Known Allergies  Patient Measurements: Height: 5\' 6"  (167.6 cm) IBW/kg (Calculated) : 63.8 Weight = 100.2 kg  Labs:  Recent Labs  04/11/13 1155  WBC 12.6*  HGB 14.5  PLT 218  CREATININE 2.72*   The CrCl is unknown because both a height and weight (above a minimum accepted value) are required for this calculation. No results found for this basename: VANCOTROUGH, VANCOPEAK, VANCORANDOM, GENTTROUGH, GENTPEAK, GENTRANDOM, TOBRATROUGH, TOBRAPEAK, TOBRARND, AMIKACINPEAK, AMIKACINTROU, AMIKACIN,  in the last 72 hours   Microbiology: No results found for this or any previous visit (from the past 720 hour(s)).  Medical History: Past Medical History  Diagnosis Date  . Diabetes mellitus without complication   . Cancer     Prostate cancer  . Renal failure   . Detached retina   . Hypertension   . Cellulitis and abscess of unspecified site 2014    lower back  . ADHD (attention deficit hyperactivity disorder)   . Asthma   . Cellulitis and abscess of trunk 2014    history of back abscess   Assessment: 62 year old male who is a diabetic, kidney transplant patient of many years who presented to the ED with a complaint of difficulty breathing and chest pain.  Intubated this afternoon and to be admitted to CCM.  Starting [place] and Vancomycin for possible pneumonia.  Scr on admission = 2.72 (CrCl approximately 35 to 40 ml/min)  Vancomycin dose given at 2 pm  Goal of Therapy:  Vancomycin trough level 15-20 mcg/ml Appropriate Fortaz dosing  Plan:  1) Vancomycin 1250 mg iv Q 24 hours (starting tomorrow) 2) Russell Bowers 2 Gram iv Q 12 hours 3) Levaquin 750 mg iv Q 48 hours 4) Follow up plan, Scr, cultures.  Thank you. Anette Guarneri, PharmD [phone]  04/11/2013,3:50 PM

## 2013-04-11 NOTE — Procedures (Signed)
Oral Intubation Procedure Note   Procedure: Intubation Indications: Respiratory insufficiency Consent: Obtained from pt and wife Time Out: Verified patient identification, verified procedure, site/side was marked, verified correct patient position, special equipment/implants available, medications/allergies/relevent history reviewed, required imaging and test results available.   Pre-meds: Etomidate 20 mg IV  Neuromuscular blockade: 30 mg rocuronium  Laryngoscope: #3 MAC  Visualization: cords fully visualized  ETT: 8.0 ETT passed on first attempt and secured @ 23 cm at incisors Confirmed with auscultation and EZCAP.  Evaluation:  Hemodynamic Status: stable O2 sats: stable Patient's Current Condition: stable Complications: none Patient did tolerate procedure well. CXR: adequate position   [employer]

## 2013-04-11 NOTE — ED Notes (Signed)
Pt placed on [place] at 4L to increase sats to 98%. Family reports he was here last year with same symptoms and was placed on oxygen at home for a period of time.

## 2013-04-11 NOTE — ED Notes (Signed)
Pt c/o chest pain x3 months. Pt has been seen at MD office for same, results were neg. Pt states this am he walked to mailbox and was extremely SOB. Currently no respiratory distress noted , pt RR 17, breath equal and unlabored. Pt denies cp currently. Denies n/v, diaphoresis, lightheadedness.

## 2013-04-11 NOTE — ED Notes (Addendum)
Pt reports intermittent chest pain over the past couple of months. Reports he has increased pain after sleeping for long period of time. Reports he had an EKG done at PCP office about a week ago. Pt also with increased SOB over the past couple of days. Pt reports midsternal chest pain when he went outside this morning he felt like he was going to pass out. Pt is a kidney transplant pt.

## 2013-04-11 NOTE — H&P (Signed)
PULMONARY  / CRITICAL CARE MEDICINE  Name: Russell Bowers MRN: [id number] DOB: Jan 22, 1951    ADMISSION DATE:  04/11/2013   BRIEF PATIENT DESCRIPTION:  74 male with hx of renal transplant intubated by PCCM in ED for acute on chronic hypercarbic respiratory failure after 1-2 months of cough, atypical chest pain and marked increase in DOE over day or two PTA. Bilateral pulmonary infiltrates on CXR. Very similar admission in Feb 2013 @ [place].   SIGNIFICANT EVENTS / STUDIES:  4/20 admitted with hypercarbic respiratory failure, pulmonary infiltrates, acute on chronic renal insuff, RBBB  LINES / TUBES: ETT 4/20 >>  R IJ CVL 4/20 >>   CULTURES: MRSA PCR 4/20 >> NEG Strep Ag 4/20 >> NEG Legionella Ag 4/20 >>  Urine 4/20 >>  Resp virus panel 4/20 >>  PJP DFA 4/20 >>  BAL cx 4/20 >>  Blood 4/20 >>    ANTIBIOTICS: Vanc 4/20 >>  Cefaz 4/20 >>  Levoflox 4/20 >>   HISTORY OF PRESENT ILLNESS:   As above. Cough productive of scant mucoid secretions. No fever, hemoptysis, pleuritic or anginal CP. Trace LE edema, not changed from baseline. No change in bowel or bladder patterns. No N/V/D. No abdominal pain  PAST MEDICAL HISTORY :  Past Medical History  Diagnosis Date  . Diabetes mellitus without complication   . Cancer     Prostate cancer  . Renal failure   . Detached retina   . Hypertension   . Cellulitis and abscess of unspecified site 2014    lower back  . ADHD (attention deficit hyperactivity disorder)   . Asthma   . Cellulitis and abscess of trunk 2014    history of back abscess   Past Surgical History  Procedure Laterality Date  . Kidney transplant  12/01/2011  . Prostectomy  11/2010  . Retinal detachment surgery  06/2002   Prior to Admission medications   Medication Sig Start Date End Date Taking? Authorizing Provider  allopurinol (ZYLOPRIM) 300 MG tablet Take 300 mg by mouth daily.   Yes Historical Provider, MD  aspirin EC 81 MG tablet Take 81 mg by mouth every other  day.   Yes Historical Provider, MD  calcitRIOL (ROCALTROL) 0.25 MCG capsule Take 0.25-0.5 mcg by mouth See admin instructions. Take 1 tablet day 1, take 2 tablet day 2, take 1 tablet day 3, and continue accordingly   Yes Historical Provider, MD  cetirizine (ZYRTEC) 10 MG tablet Take 10 mg by mouth daily.   Yes Historical Provider, MD  cinacalcet (SENSIPAR) 30 MG tablet Take 30 mg by mouth daily.   Yes Historical Provider, MD  furosemide (LASIX) 80 MG tablet Take 80 mg by mouth 4 (four) times daily.    Yes Historical Provider, MD  glipiZIDE (GLUCOTROL) 10 MG tablet Take 10 mg by mouth 2 (two) times daily before a meal.   Yes Historical Provider, MD  insulin glargine (LANTUS) 100 UNIT/ML injection Inject 12 Units into the skin at bedtime.   Yes Historical Provider, MD  insulin regular (NOVOLIN R,HUMULIN R) 100 units/mL injection Inject 2-8 Units into the skin 3 (three) times daily as needed. Takes as needed per sliding scale   Yes Historical Provider, MD  metoprolol (LOPRESSOR) 50 MG tablet Take 50 mg by mouth 2 (two) times daily.    Yes Historical Provider, MD  mycophenolate (MYFORTIC) 180 MG EC tablet Take 360 mg by mouth 2 (two) times daily.   Yes Historical Provider, MD  omeprazole (PRILOSEC) 20 MG capsule  Take 20 mg by mouth 2 (two) times daily.    Yes Historical Provider, MD  pravastatin (PRAVACHOL) 80 MG tablet Take 80 mg by mouth daily.   Yes Historical Provider, MD  sodium chloride (OCEAN) 0.65 % nasal spray Place 3-4 sprays into the nose at bedtime.   Yes Historical Provider, MD  tacrolimus (PROGRAF) 1 MG capsule Take 3 mg by mouth 2 (two) times daily.   Yes Historical Provider, MD  B-D ULTRAFINE III SHORT PEN 31G X 8 MM MISC  12/28/12   Historical Provider, MD   No Known Allergies  FAMILY HISTORY:  Family History  Problem Relation Age of Onset  . Diabetes Mother    SOCIAL HISTORY:  reports that he quit smoking about 20 years ago. He does not have any smokeless tobacco history on file.  He reports that  drinks alcohol. His drug history is not on file.  REVIEW OF SYSTEMS:  No fever, hemoptysis, pleuritic or anginal CP. Trace LE edema, not changed from baseline. No change in bowel or bladder patterns. No N/V/D. No abdominal pain   SUBJECTIVE:   VITAL SIGNS: Temp:  [97 F (36.1 C)-99 F (37.2 C)] 99 F (37.2 C) (04/20 1900) Pulse Rate:  [75-109] 80 (04/20 1915) Resp:  [16-33] 16 (04/20 1915) BP: (83-174)/(46-87) 102/61 mmHg (04/20 1915) SpO2:  [50 %-100 %] 100 % (04/20 1915) FiO2 (%):  [39.4 %-60 %] 40 % (04/20 1915) HEMODYNAMICS: CVP:  [9 mmHg-11 mmHg] 9 mmHg VENTILATOR SETTINGS: Vent Mode:  [-] PRVC FiO2 (%):  [39.4 %-60 %] 40 % Set Rate:  [16 bmp] 16 bmp Vt Set:  [500 mL] 500 mL PEEP:  [5 cmH20] 5 cmH20 Plateau Pressure:  [19 cmH20-22 cmH20] 19 cmH20 INTAKE / OUTPUT: Intake/Output     04/20 0701 - 04/21 0700   I.V. 46.2   NG/GT 60   IV Piggyback 50   Total Intake 156.2   Urine 225   Total Output 225   Net -68.8         PHYSICAL EXAMINATION: General:  Lethargic, no distress, somnolent Neuro:  RASS -2, CNs intact, MAEs, DTRs intact HEENT:  [place]/AT, PERRL, EOMI Cardiovascular:  RRR s M Lungs:  Scattered rhonchi anteriorly, bibasilar bronchial BS posteriorly Abdomen:  Mod obese, soft, NT, NABS EXT: warm, trace pretibial edema Skin:  No lesions  LABS:  Recent Labs Lab 04/11/13 1155 04/11/13 1156 04/11/13 1204 04/11/13 1218 04/11/13 1542 04/11/13 1606  HGB 14.5  --   --   --   --   --   WBC 12.6*  --   --   --   --   --   PLT 218  --   --   --   --   --   NA 137  --   --   --   --   --   K 5.0  --   --   --   --   --   CL 95*  --   --   --   --   --   CO2 33*  --   --   --   --   --   GLUCOSE 381*  --   --   --   --   --   BUN 56*  --   --   --   --   --   CREATININE 2.72*  --   --   --   --   --  CALCIUM 9.6  --   --   --   --   --   APTT 29  --   --   --   --   --   INR 1.16  --   --   --   --   --   LATICACIDVEN  --   --   --  1.01   --   --   TROPONINI <0.30  --   --   --   --   --   PROCALCITON  --   --   --   --  0.20  --   PROBNP  --  17454.0*  --   --   --   --   PHART  --   --  7.178*  --   --  7.311*  PCO2ART  --   --  94.0*  --   --  60.7*  PO2ART  --   --  87.0  --   --  97.0    Recent Labs Lab 04/11/13 1637 04/11/13 1913  GLUCAP 333* 285*    CXR: Low volumes, ill defined bilateral infiltrates, suspect AB grams in RLL medially  ASSESSMENT / PLAN:  PULMONARY A: Acute hypercarbic resp failure Pulmonary infiltrates - edema vs infection P:   -Full vent support and vent bundle -Daily WUA/SBT as indicated  CARDIOVASCULAR A: Elevated BNP RBBB - unclear whether old or new Recent atypical CP - Trop I intially negative, doubt acute coronary syndrome P:  -Monitor CVP. Goal 10-14 on vent -Echo ordered -cycle markers  RENAL A:  Hx of renal transplant Acute on chronic renal insuff P:   IVFs as ordered Monitor BMET Renal consult called  GASTROINTESTINAL A:  No issues P:   TF consult requested for 4/21  HEMATOLOGIC A:  No issues P:  Monitor CBC intermittently  INFECTIOUS A:  Immunosuppressed Pulmonary infiltrates, possible PNA P:   Micro and abx as above Consider FOB for BAL if dx not clarified  ENDOCRINE A:  DM2 Poor glycemic control P:   SSI ordered  NEUROLOGIC A:  Acute enceph due to hypercarbia P:   Intermittent sedation while intubated   TODAY'S SUMMARY:   I have personally obtained a history, examined the patient, evaluated laboratory and imaging results, formulated the assessment and plan and placed orders. CRITICAL CARE: The patient is critically ill with multiple organ systems failure and requires high complexity decision making for assessment and support, frequent evaluation and titration of therapies, application of advanced monitoring technologies and extensive interpretation of multiple databases. Critical Care Time devoted to patient care services described in  this note is 60 minutes.    Wife updated in detail   Merton Border, MD ; [facility] [phone].  After 5:30 PM or weekends, call [phone]  Pulmonary and [place] Pager: [phone]  04/11/2013, 9:45 PM

## 2013-04-11 NOTE — ED Notes (Signed)
Intubated at 1504, pos color change

## 2013-04-11 NOTE — ED Notes (Signed)
CVP cart at bedside for Dr. Alva Garnet to insert central line

## 2013-04-11 NOTE — ED Notes (Signed)
IV team at bedside for start

## 2013-04-11 NOTE — Procedures (Signed)
PROCEDURE NOTE: R IJ CVL PLACEMENT  INDICATION:    Monitoring of central venous pressures and/or administration of medications optimally administered in central vein  CONSENT:   Risks of procedure as well as the alternatives were explained to the patient or surrogate. Consent for procedure obtained. A time out was performed to review patient identification, procedure to be performed, correct patient position, medications/allergies/relevent history, required imaging and test results.  PROCEDURE  Maximum sterile technique was used including antiseptics, cap, gloves, gown, hand hygiene, mask and sheet.  Skin prep: Chlorhexidine; local anesthetic administered  A antimicrobial bonded/coated triple lumen catheter was placed in the R IJ vein using the Seldinger technique.  Ultrasound was used for vessel identification and guidance.   EVALUATION:  Blood flow good  Complications: No apparent complications  Patient tolerated the procedure well.  Chest X-ray revealed no ptx and adequate posiiton   Merton Border, MD PCCM service Mobile [phone]

## 2013-04-11 NOTE — ED Provider Notes (Signed)
History     CSN: [id number]  Arrival date & time 04/11/13  1055   First MD Initiated Contact with Patient 04/11/13 1121      Chief Complaint  Patient presents with  . Chest Pain    (Consider location/radiation/quality/duration/timing/severity/associated sxs/prior treatment) HPI Comments: The patient is a 62 year old male who is a diabetic, kidney transplant patient of many years who presents with a complaint of difficulty breathing and some chest pain. He states that he has had a significant cough over the last week, was seen by nephrology earlier in the week by Dr.Deterding who the patient states ran some tests and stated that his kidneys were working well. He has been having intermittent chest pains at night thought to be related to acid reflux but has not had any relief with his proton pump inhibitor. He was seen by cardiology this week with [facility] cardiology and was scheduled for a stress test as an outpatient to further evaluate his chest pain however this has not been performed yet. He states that this morning he had increased shortness of breath which prompted his visit to the emergency department. The patient denies fevers, denies swelling of the lower extremities, denies abdominal pain, headache or blurred vision. His spouse relates that he has had a history of hypercapnia which has required admission to the hospital and has also had a history of reporting nighttime oxygen which she has not used regularly. The symptoms were severe this morning, nothing seemed to make it better or worse.  The history is provided by the patient, the spouse and medical records.    Past Medical History  Diagnosis Date  . Diabetes mellitus without complication   . Cancer     Prostate cancer  . Renal failure   . Detached retina   . Hypertension   . Cellulitis and abscess of unspecified site 2014    lower back  . ADHD (attention deficit hyperactivity disorder)   . Asthma   . Cellulitis and abscess of  trunk 2014    history of back abscess    Past Surgical History  Procedure Laterality Date  . Kidney transplant  12/01/2011  . Prostectomy  11/2010  . Retinal detachment surgery  06/2002    Family History  Problem Relation Age of Onset  . Diabetes Mother     History  Substance Use Topics  . Smoking status: Former Smoker    Quit date: 03/16/1993  . Smokeless tobacco: Not on file  . Alcohol Use: 0.0 oz/week    1-2 Glasses of wine per week      Review of Systems  All other systems reviewed and are negative.    Allergies  Review of patient's allergies indicates no known allergies.  Home Medications   Current Outpatient Rx  Name  Route  Sig  Dispense  Refill  . allopurinol (ZYLOPRIM) 300 MG tablet   Oral   Take 300 mg by mouth daily.         [name] aspirin EC 81 MG tablet   Oral   Take 81 mg by mouth every other day.         . calcitRIOL (ROCALTROL) 0.25 MCG capsule   Oral   Take 0.25-0.5 mcg by mouth See admin instructions. Take 1 tablet day 1, take 2 tablet day 2, take 1 tablet day 3, and continue accordingly         . cetirizine (ZYRTEC) 10 MG tablet   Oral   Take 10 mg by mouth  daily.         . cinacalcet (SENSIPAR) 30 MG tablet   Oral   Take 30 mg by mouth daily.         . furosemide (LASIX) 80 MG tablet   Oral   Take 80 mg by mouth 4 (four) times daily.          [name] glipiZIDE (GLUCOTROL) 10 MG tablet   Oral   Take 10 mg by mouth 2 (two) times daily before a meal.         . insulin glargine (LANTUS) 100 UNIT/ML injection   Subcutaneous   Inject 12 Units into the skin at bedtime.         . insulin regular (NOVOLIN R,HUMULIN R) 100 units/mL injection   Subcutaneous   Inject 2-8 Units into the skin 3 (three) times daily as needed. Takes as needed per sliding scale         . metoprolol (LOPRESSOR) 50 MG tablet   Oral   Take 50 mg by mouth 2 (two) times daily.          . mycophenolate (MYFORTIC) 180 MG EC tablet   Oral   Take 360  mg by mouth 2 (two) times daily.         [name] omeprazole (PRILOSEC) 20 MG capsule   Oral   Take 20 mg by mouth 2 (two) times daily.          . pravastatin (PRAVACHOL) 80 MG tablet   Oral   Take 80 mg by mouth daily.         . sodium chloride (OCEAN) 0.65 % nasal spray   Nasal   Place 3-4 sprays into the nose at bedtime.         . tacrolimus (PROGRAF) 1 MG capsule   Oral   Take 3 mg by mouth 2 (two) times daily.         . B-D ULTRAFINE III SHORT PEN 31G X 8 MM MISC                 BP 137/70  Pulse 102  Temp(Src) 97.5 F (36.4 C) (Oral)  Resp 27  SpO2 95%  Physical Exam  Nursing note and vitals reviewed. Constitutional: He appears well-developed and well-nourished. No distress.  HENT:  Head: Normocephalic and atraumatic.  Mouth/Throat: Oropharynx is clear and moist. No oropharyngeal exudate.  Eyes: Conjunctivae and EOM are normal. Pupils are equal, round, and reactive to light. Right eye exhibits no discharge. Left eye exhibits no discharge. No scleral icterus.  Neck: Normal range of motion. Neck supple. No JVD present. No thyromegaly present.  Cardiovascular: Normal rate, regular rhythm, normal heart sounds and intact distal pulses.  Exam reveals no gallop and no friction rub.   No murmur heard. Pulmonary/Chest: He has no wheezes. He has rales ( Rales at the bilateral bases).  Slight increased work of breathing, tachypnea, hypoxia to 80% on room air  Abdominal: Soft. Bowel sounds are normal. He exhibits no distension and no mass. There is no tenderness.  Musculoskeletal: Normal range of motion. He exhibits no edema and no tenderness.  Lymphadenopathy:    He has no cervical adenopathy.  Neurological: He is alert. Coordination normal.  Skin: Skin is warm and dry. No rash noted. No erythema.  Psychiatric: He has a normal mood and affect. His behavior is normal.    ED Course  Procedures (including critical care time)  Labs Reviewed  CBC WITH DIFFERENTIAL -  Abnormal; Notable for the following:    WBC 12.6 (*)    RDW 16.7 (*)    Neutrophils Relative 92 (*)    Neutro Abs 11.7 (*)    Lymphocytes Relative 3 (*)    Lymphs Abs 0.3 (*)    All other components within normal limits  BASIC METABOLIC PANEL - Abnormal; Notable for the following:    Chloride 95 (*)    CO2 33 (*)    Glucose, Bld 381 (*)    BUN 56 (*)    Creatinine, Ser 2.72 (*)    GFR calc non Af Amer 24 (*)    GFR calc Af Amer 27 (*)    All other components within normal limits  PRO B NATRIURETIC PEPTIDE - Abnormal; Notable for the following:    Pro B Natriuretic peptide (BNP) 17454.0 (*)    All other components within normal limits  POCT I-STAT 3, BLOOD GAS (G3+) - Abnormal; Notable for the following:    pH, Arterial 7.178 (*)    pCO2 arterial 94.0 (*)    Bicarbonate 35.2 (*)    All other components within normal limits  CULTURE, BLOOD (ROUTINE X 2)  URINE CULTURE  CULTURE, BLOOD (ROUTINE X 2)  CULTURE, BLOOD (ROUTINE X 2)  TROPONIN I  APTT  PROTIME-INR  BLOOD GAS, ARTERIAL  URINALYSIS, ROUTINE W REFLEX MICROSCOPIC  CG4 I-STAT (LACTIC ACID)   Dg Chest Port 1 View  04/11/2013  *RADIOLOGY REPORT*  Clinical Data: Chest pain, shortness of breath.  PORTABLE CHEST - 1 VIEW  Comparison: 04/05/2013  Findings: Low lung volumes.  Cardiomegaly.  Vascular congestion and diffuse bilateral airspace opacities, likely edema.  Bibasilar atelectasis.  No visible effusions.  IMPRESSION: Low lung volumes with bibasilar atelectasis.  Mild CHF.   Original Report Authenticated By: Rolm Baptise, M.D.      1. Respiratory failure   2. Hypercapnia   3. Renal failure   4. Leukocytosis   5. CHF (congestive heart failure)       MDM  The patient appears to have mild increased work of breathing, his sats are low 80% with a normal waveform. I am concerned for pneumonia, pulmonary edema is another possibility, the patient has no history of coronary disease and has had a normal stress test in the  past prior to his renal transplant but may have developed this since that time given his comorbidities. Chest x-ray pending, EKG shows right bundle branch block with no old EKG with which to compare.  ED ECG REPORT  I personally interpreted this EKG   Date: 04/11/2013   Rate: 102  Rhythm: sinus tachycardia  QRS Axis: right  Intervals: normal  ST/T Wave abnormalities: nonspecific ST/T changes  Conduction Disutrbances:right bundle branch block  Narrative Interpretation:   Old EKG Reviewed: none available   Laboratory workup shows a leukocytosis, very elevated BNP, acute on chronic renal failure and hypercapnic respiratory acidosis. I discussed the patient's care with the pulmonary critical care doctor, Dr. Jamal Collin who will come see the patient in the emergency department and admit. Nephrology paged.  Lasix given, broad-spectrum antibiotic to cover for increased cough with pulmonary infiltrates and a leukocytosis though could be pulmonary edema. Due to the patient's immunocompromised status I thought this was best at this time. Blood cultures were obtained prior to antibiotic administration. The patient also required BiPAP because of his hypercapnic respiratory failure. His mental status was slightly waning but he tolerated the BiPAP okay at this time.  CRITICAL CARE Performed  by: Maycen Degregory D   Total critical care time: 35  Critical care time was exclusive of separately billable procedures and treating other patients.  Critical care was necessary to treat or prevent imminent or life-threatening deterioration.  Critical care was time spent personally by me on the following activities: development of treatment plan with patient and/or surrogate as well as nursing, discussions with consultants, evaluation of patient's response to treatment, examination of patient, obtaining history from patient or surrogate, ordering and performing treatments and interventions, ordering and review of  laboratory studies, ordering and review of radiographic studies, pulse oximetry and re-evaluation of patient's condition.       Johnna Acosta, MD 04/11/13 [phone]

## 2013-04-11 NOTE — ED Notes (Signed)
Attempted IV x2, unsuccessful, IV team paged

## 2013-04-11 NOTE — ED Notes (Addendum)
MD in room. Pt taken off 4L, O2 in 70s. Pt placed back on 3L per MD

## 2013-04-12 ENCOUNTER — Encounter (HOSPITAL_COMMUNITY): Payer: Self-pay | Admitting: Interventional Cardiology

## 2013-04-12 ENCOUNTER — Inpatient Hospital Stay (HOSPITAL_COMMUNITY): Payer: Medicare Other

## 2013-04-12 ENCOUNTER — Encounter (HOSPITAL_COMMUNITY): Payer: Self-pay | Admitting: Radiology

## 2013-04-12 DIAGNOSIS — I509 Heart failure, unspecified: Secondary | ICD-10-CM

## 2013-04-12 DIAGNOSIS — B954 Other streptococcus as the cause of diseases classified elsewhere: Secondary | ICD-10-CM

## 2013-04-12 DIAGNOSIS — L03319 Cellulitis of trunk, unspecified: Secondary | ICD-10-CM

## 2013-04-12 DIAGNOSIS — J189 Pneumonia, unspecified organism: Secondary | ICD-10-CM

## 2013-04-12 DIAGNOSIS — R0602 Shortness of breath: Secondary | ICD-10-CM | POA: Insufficient documentation

## 2013-04-12 LAB — BLOOD GAS, ARTERIAL
Bicarbonate: 29.5 mEq/L — ABNORMAL HIGH (ref 20.0–24.0)
MECHVT: 500 mL
O2 Saturation: 94.9 %
PEEP: 5 cmH2O
Patient temperature: 98.6
RATE: 16 resp/min

## 2013-04-12 LAB — COMPREHENSIVE METABOLIC PANEL
Albumin: 3.1 g/dL — ABNORMAL LOW (ref 3.5–5.2)
BUN: 56 mg/dL — ABNORMAL HIGH (ref 6–23)
Chloride: 100 mEq/L (ref 96–112)
Creatinine, Ser: 2.68 mg/dL — ABNORMAL HIGH (ref 0.50–1.35)
GFR calc non Af Amer: 24 mL/min — ABNORMAL LOW (ref >90)
Total Bilirubin: 0.6 mg/dL (ref 0.3–1.2)

## 2013-04-12 LAB — PROCALCITONIN: Procalcitonin: 0.37 ng/mL

## 2013-04-12 LAB — PRO B NATRIURETIC PEPTIDE: Pro B Natriuretic peptide (BNP): 16846 pg/mL — ABNORMAL HIGH (ref 0–125)

## 2013-04-12 LAB — LEGIONELLA ANTIGEN, URINE: Legionella Antigen, Urine: NEGATIVE

## 2013-04-12 LAB — CK TOTAL AND CKMB (NOT AT ARMC)
Relative Index: INVALID (ref 0.0–2.5)
Relative Index: INVALID (ref 0.0–2.5)
Total CK: 23 U/L (ref 7–232)
Total CK: 19 U/L (ref 7–232)

## 2013-04-12 LAB — RESPIRATORY VIRUS PANEL
Influenza B: NOT DETECTED
Metapneumovirus: NOT DETECTED
Parainfluenza 1: NOT DETECTED
Parainfluenza 3: NOT DETECTED

## 2013-04-12 LAB — PHOSPHORUS: Phosphorus: 1.9 mg/dL — ABNORMAL LOW (ref 2.3–4.6)

## 2013-04-12 LAB — CBC
MCV: 89.9 fL (ref 78.0–100.0)
Platelets: 176 10*3/uL (ref 150–400)
RBC: 4.34 MIL/uL (ref 4.22–5.81)
WBC: 11.8 10*3/uL — ABNORMAL HIGH (ref 4.0–10.5)

## 2013-04-12 LAB — TROPONIN I
Troponin I: 0.86 ng/mL (ref <0.30)
Troponin I: 0.79 ng/mL (ref <0.30)

## 2013-04-12 LAB — PROTIME-INR
INR: 1.23 (ref 0.00–1.49)
Prothrombin Time: 15.3 seconds — ABNORMAL HIGH (ref 11.6–15.2)

## 2013-04-12 LAB — PNEUMOCYSTIS JIROVECI SMEAR BY DFA

## 2013-04-12 LAB — GLUCOSE, CAPILLARY: Glucose-Capillary: 183 mg/dL — ABNORMAL HIGH (ref 70–99)

## 2013-04-12 LAB — MAGNESIUM: Magnesium: 1.9 mg/dL (ref 1.5–2.5)

## 2013-04-12 LAB — APTT: aPTT: 29 seconds (ref 24–37)

## 2013-04-12 MED ORDER — PRO-STAT SUGAR FREE PO LIQD
60.0000 mL | Freq: Three times a day (TID) | ORAL | Status: DC
  Administered 2013-04-12 (×3): 60 mL
  Filled 2013-04-12 (×6): qty 60

## 2013-04-12 MED ORDER — ASPIRIN EC 325 MG PO TBEC
325.0000 mg | DELAYED_RELEASE_TABLET | Freq: Every day | ORAL | Status: DC
  Filled 2013-04-12: qty 1

## 2013-04-12 MED ORDER — FUROSEMIDE 10 MG/ML IJ SOLN
160.0000 mg | Freq: Two times a day (BID) | INTRAVENOUS | Status: DC
  Administered 2013-04-12 – 2013-04-13 (×3): 160 mg via INTRAVENOUS
  Filled 2013-04-12 (×5): qty 16

## 2013-04-12 MED ORDER — METOPROLOL TARTRATE 25 MG PO TABS
25.0000 mg | ORAL_TABLET | Freq: Two times a day (BID) | ORAL | Status: DC
  Filled 2013-04-12 (×2): qty 1

## 2013-04-12 MED ORDER — OSMOLITE 1.2 CAL PO LIQD
1000.0000 mL | ORAL | Status: DC
  Administered 2013-04-12: 1000 mL
  Filled 2013-04-12 (×4): qty 1000

## 2013-04-12 MED ORDER — CHLORHEXIDINE GLUCONATE 0.12 % MT SOLN: 15.0000 mL | Freq: Two times a day (BID) | OROMUCOSAL | Status: DC

## 2013-04-12 MED ORDER — PANTOPRAZOLE SODIUM 40 MG PO TBEC
40.0000 mg | DELAYED_RELEASE_TABLET | Freq: Every day | ORAL | Status: DC
  Filled 2013-04-12: qty 1

## 2013-04-12 MED ORDER — FUROSEMIDE 10 MG/ML IJ SOLN
40.0000 mg | Freq: Once | INTRAMUSCULAR | Status: AC
  Administered 2013-04-12: 40 mg via INTRAVENOUS
  Filled 2013-04-12: qty 4

## 2013-04-12 MED ORDER — METOPROLOL TARTRATE 50 MG PO TABS
50.0000 mg | ORAL_TABLET | Freq: Two times a day (BID) | ORAL | Status: DC
  Filled 2013-04-12: qty 1

## 2013-04-12 MED ORDER — HEPARIN (PORCINE) IN NACL 100-0.45 UNIT/ML-% IJ SOLN
1600.0000 [IU]/h | INTRAMUSCULAR | Status: DC
  Administered 2013-04-12: 1300 [IU]/h via INTRAVENOUS
  Administered 2013-04-13: 1600 [IU]/h via INTRAVENOUS
  Filled 2013-04-12 (×4): qty 250

## 2013-04-12 MED ORDER — METOPROLOL TARTRATE 50 MG PO TABS
50.0000 mg | ORAL_TABLET | Freq: Two times a day (BID) | ORAL | Status: DC
  Administered 2013-04-12 – 2013-04-13 (×3): 50 mg via ORAL
  Filled 2013-04-12 (×6): qty 1

## 2013-04-12 MED ORDER — ASPIRIN 325 MG PO TABS
325.0000 mg | ORAL_TABLET | Freq: Every day | ORAL | Status: DC
  Administered 2013-04-12 – 2013-04-13 (×2): 325 mg via NASOGASTRIC
  Filled 2013-04-12 (×2): qty 1

## 2013-04-12 MED ORDER — PANTOPRAZOLE SODIUM 40 MG PO PACK
40.0000 mg | PACK | Freq: Every day | ORAL | Status: DC
  Administered 2013-04-12: 40 mg
  Filled 2013-04-12 (×2): qty 20

## 2013-04-12 MED ORDER — METRONIDAZOLE IN NACL 5-0.79 MG/ML-% IV SOLN
500.0000 mg | Freq: Three times a day (TID) | INTRAVENOUS | Status: DC
  Administered 2013-04-12 – 2013-04-15 (×9): 500 mg via INTRAVENOUS
  Filled 2013-04-12 (×13): qty 100

## 2013-04-12 MED ORDER — CHLORHEXIDINE GLUCONATE 0.12 % MT SOLN
15.0000 mL | Freq: Two times a day (BID) | OROMUCOSAL | Status: DC
  Administered 2013-04-12 – 2013-04-13 (×3): 15 mL via OROMUCOSAL
  Filled 2013-04-12 (×3): qty 15

## 2013-04-12 MED ORDER — BIOTENE DRY MOUTH MT LIQD: 15.0000 mL | Freq: Four times a day (QID) | OROMUCOSAL | Status: DC

## 2013-04-12 MED ORDER — BIOTENE DRY MOUTH MT LIQD
15.0000 mL | Freq: Four times a day (QID) | OROMUCOSAL | Status: DC
  Administered 2013-04-12 – 2013-04-13 (×6): 15 mL via OROMUCOSAL

## 2013-04-12 MED ORDER — CHLORHEXIDINE GLUCONATE 0.12 % MT SOLN
15.0000 mL | Freq: Two times a day (BID) | OROMUCOSAL | Status: DC
  Administered 2013-04-12 – 2013-04-13 (×2): 15 mL via OROMUCOSAL

## 2013-04-12 MED ORDER — NEPRO/CARBSTEADY PO LIQD
1000.0000 mL | ORAL | Status: DC
  Filled 2013-04-12: qty 1000

## 2013-04-12 NOTE — Consult Note (Signed)
Admit date: 04/11/2013 Referring Physician  Dr. Joya Gaskins Primary Physician  Dr. Rex Kras Primary Cardiologist  Dr. Marlou Porch Reason for Consultation  Abnormal troponin  HPI: 62 y/o with h/o renal transplant.  Over the past few weeks he has been having reflux-like symptoms in his chest.  He was seen in our office and set up for a stress test.  His symptoms were not related to exertion.  He apparently became more short of breath after being seen in the office and presented to the emergency room.  He was found to have hypercarbic respiratory failure and was recently intubated.  Rest to see the patient because his cardiac enzymes have turned positive.  His initial troponin was negative.  In 2012, he had a nuclear stress test which showed no evidence of ischemia and normal left ventricular function.  His BNP is quite elevated.  There is concern for pulmonary edema and pleural effusions on his chest x-ray.  In 2012, he also had an echocardiogram.  This revealed moderate left ventricular hypertrophy with normal systolic function.  He had mild aortic stenosis and at that time, had a large left pleural effusion.      PMH:   Past Medical History  Diagnosis Date  . Diabetes mellitus without complication   . Cancer     Prostate cancer  . Renal failure   . Detached retina   . Hypertension   . Cellulitis and abscess of unspecified site 2014    lower back  . ADHD (attention deficit hyperactivity disorder)   . Asthma   . Cellulitis and abscess of trunk 2014    history of back abscess     PSH:   Past Surgical History  Procedure Laterality Date  . Kidney transplant  12/01/2011  . Prostectomy  11/2010  . Retinal detachment surgery  06/2002    Allergies:  Review of patient's allergies indicates no known allergies. Prior to Admit Meds:   Prescriptions prior to admission  Medication Sig Dispense Refill  . allopurinol (ZYLOPRIM) 300 MG tablet Take 300 mg by mouth daily.      [name] aspirin EC 81 MG tablet Take 81  mg by mouth every other day.      . calcitRIOL (ROCALTROL) 0.25 MCG capsule Take 0.25-0.5 mcg by mouth See admin instructions. Take 1 tablet day 1, take 2 tablet day 2, take 1 tablet day 3, and continue accordingly      . cetirizine (ZYRTEC) 10 MG tablet Take 10 mg by mouth daily.      . cinacalcet (SENSIPAR) 30 MG tablet Take 30 mg by mouth daily.      . furosemide (LASIX) 80 MG tablet Take 80 mg by mouth 4 (four) times daily.       [name] glipiZIDE (GLUCOTROL) 10 MG tablet Take 10 mg by mouth 2 (two) times daily before a meal.      . insulin glargine (LANTUS) 100 UNIT/ML injection Inject 12 Units into the skin at bedtime.      . insulin regular (NOVOLIN R,HUMULIN R) 100 units/mL injection Inject 2-8 Units into the skin 3 (three) times daily as needed. Takes as needed per sliding scale      . metoprolol (LOPRESSOR) 50 MG tablet Take 50 mg by mouth 2 (two) times daily.       . mycophenolate (MYFORTIC) 180 MG EC tablet Take 360 mg by mouth 2 (two) times daily.      [name] omeprazole (PRILOSEC) 20 MG capsule Take 20 mg by mouth 2 (  two) times daily.       . pravastatin (PRAVACHOL) 80 MG tablet Take 80 mg by mouth daily.      . sodium chloride (OCEAN) 0.65 % nasal spray Place 3-4 sprays into the nose at bedtime.      . tacrolimus (PROGRAF) 1 MG capsule Take 3 mg by mouth 2 (two) times daily.      . B-D ULTRAFINE III SHORT PEN 31G X 8 MM MISC        Fam HX:    Family History  Problem Relation Age of Onset  . Diabetes Mother    Social HX:    History   Social History  . Marital Status: Married    Spouse Name: N/A    Number of Children: N/A  . Years of Education: N/A   Occupational History  . Not on file.   Social History Main Topics  . Smoking status: Former Smoker    Quit date: 03/16/1993  . Smokeless tobacco: Not on file  . Alcohol Use: 0.0 oz/week    1-2 Glasses of wine per week  . Drug Use: Not on file  . Sexually Active: Not on file   Other Topics Concern  . Not on file   Social  History Narrative  . No narrative on file     ROS:  All 11 ROS were addressed and are negative except what is stated in the HPI; unable to obtain due to the patient being intubated.  Physical Exam: Blood pressure 125/65, pulse 105, temperature 99.2 F (37.3 C), temperature source Oral, resp. rate 24, height 5\' 10"  (1.778 m), weight 102.5 kg (225 lb 15.5 oz), SpO2 100.00%.    General: Well developed, well nourished, in no acute distress Head:   Normal cephalic and atramatic  Lungs:  Coarse breath sounds bilaterally Heart:   HRRR S1 S2, intermittent tachycardia Abdomen:  abdomen soft and non-distended Msk:  Back normal, normal gait. Normal strength and tone for age. Extremities: No  edema.  DP +1 Neuro: Unable to assess Psych: Unable to assess    Labs:   Lab Results  Component Value Date   WBC 11.8* 04/12/2013   HGB 12.3* 04/12/2013   HCT 39.0 04/12/2013   MCV 89.9 04/12/2013   PLT 176 04/12/2013    Recent Labs Lab 04/12/13 0400  NA 142  K 3.5  CL 100  CO2 30  BUN 56*  CREATININE 2.68*  CALCIUM 9.6  PROT 5.6*  BILITOT 0.6  ALKPHOS 111  ALT 61*  AST 26  GLUCOSE 73   No results found for this basename: PTT   Lab Results  Component Value Date   INR 1.23 04/12/2013   INR 1.16 04/11/2013   INR 1.01 03/11/2013   Lab Results  Component Value Date   TROPONINI 0.86* 04/12/2013     No results found for this basename: CHOL   No results found for this basename: HDL   No results found for this basename: LDLCALC   No results found for this basename: TRIG   No results found for this basename: CHOLHDL   No results found for this basename: LDLDIRECT      Radiology:  Dg Chest Port 1 View  04/12/2013  *RADIOLOGY REPORT*  Clinical Data: Respiratory failure.  Shortness of breath.  PORTABLE CHEST - 1 VIEW  Comparison: Chest 04/11/2013 and 04/05/2013  Findings: Support tubes and lines are unchanged.  There has been interval increase in bilateral pleural effusions and basilar  airspace disease.  Pulmonary edema appears unchanged.  IMPRESSION: Increased bilateral pleural effusions and basilar airspace disease. No marked change in pulmonary edema.   Original Report Authenticated By: Orlean Patten, M.D.    Dg Chest Portable 1 View  04/11/2013  *RADIOLOGY REPORT*  Clinical Data: Post intubation and central line placement.  PORTABLE CHEST - 1 VIEW  Comparison: 04/11/2013  Findings: Endotracheal tube is 3.4 cm above the carina.  Right central line tip is at the cavoatrial junction.  No pneumothorax. Low lung volumes with bilateral airspace opacities, likely edema. Mild cardiomegaly.  Suspect small effusions.  NG tube is in place within the stomach.  IMPRESSION: Support devices as above.  Continued bilateral airspace opacities, likely edema.  Low volumes with bibasilar atelectasis and small effusions.   Original Report Authenticated By: Rolm Baptise, M.D.    Dg Chest Port 1 View  04/11/2013  *RADIOLOGY REPORT*  Clinical Data: Chest pain, shortness of breath.  PORTABLE CHEST - 1 VIEW  Comparison: 04/05/2013  Findings: Low lung volumes.  Cardiomegaly.  Vascular congestion and diffuse bilateral airspace opacities, likely edema.  Bibasilar atelectasis.  No visible effusions.  IMPRESSION: Low lung volumes with bibasilar atelectasis.  Mild CHF.   Original Report Authenticated By: Rolm Baptise, M.D.     EKG:  Normal sinus rhythm with nonspecific ST/T-wave changes.  ASSESSMENT: Respiratory failure, abnormal troponin, history of renal transplant  PLAN:  Unclear whether this abnormal troponin truly represents plaque rupture MI.  This may be demand ischemia due to his severe respiratory illness.  Check echocardiogram to look for regional wall motion abnormality.  We'll also add on CK, CK-MB blood test to assess degree of ischemia.  Troponin is only mildly elevated.  If wall motion is normal, he will likely not need heparin.  Given his chronic renal insufficiency and history of renal  transplant, I would have a very high threshold to perform angiography on this patient due to the risk of renal failure.  Known left ventricular hypertrophy.  Now with elevated BNP and evidence of fluid overload.  This may represent diastolic dysfunction.  Would diuresis as blood pressure tolerates.  Antibiotics to treat infectious etiology of infiltrates.  Continue supportive care.  On telemetry, the patient appears to have intermittent atrial tachycardia with heart rates up to 130 beats per minute.  This rhythm will break and his heart rate goes back to the 90-100 range.  He has several different morphologies of P waves.  This may represent wandering atrial pacemaker.  Will increase metoprolol to 50 mg twice a day.  Will follow along.  Jettie Booze., MD  04/12/2013  11:33 AM

## 2013-04-12 NOTE — Consult Note (Signed)
Reason for Consult:Renal Transplant, AKI Referring Physician: Dr. Juliann Mule is an 62 y.o. male.  HPI: 62 yr old male with hx ESRD from IgA nephropathy and Type 2 DM.  Was on HD until 12/12 when received CAD renal tx from [facility].  Baseline Cr, 1.8-2.3.  Has HTN, DM, HPTH, ^ Lipids post tx.  In the first 3 mon post tx, had recurrent vol xs.  Seen early last week, just not feeling as good with burning in chest, going to jaw felt to be reflux, but concerns of cardiac causes.  Saw cards on Thurs and set up for stress test.  Had mild ^ SOB, without cough, but some PND.  No fevers, chills or cough, N, V, D, rash, ST. Has post nasal drip.  More SOB yest so came to ER.  Entub for dyspnea.  Has pos Top, ^^BNP.  EKG early last week because of runs of SVT unrevealing. Pertinent items are noted in HPI. entub but ROS reviewed as above   Past Medical History  Diagnosis Date  . Diabetes mellitus without complication   . Cancer     Prostate cancer  . Renal failure   . Detached retina   . Hypertension   . Cellulitis and abscess of unspecified site 2014    lower back  . ADHD (attention deficit hyperactivity disorder)   . Asthma   . Cellulitis and abscess of trunk 2014    history of back abscess  . Elevated troponin     Past Surgical History  Procedure Laterality Date  . Kidney transplant  12/01/2011  . Prostectomy  11/2010  . Retinal detachment surgery  06/2002    Family History  Problem Relation Age of Onset  . Diabetes Mother     Social History:  reports that he quit smoking about 20 years ago. He does not have any smokeless tobacco history on file. He reports that  drinks alcohol. His drug history is not on file.  Allergies: No Known Allergies  Medications:  I have reviewed the patient's current medications. Prior to Admission:  Prescriptions prior to admission  Medication Sig Dispense Refill  . allopurinol (ZYLOPRIM) 300 MG tablet Take 300 mg by mouth daily.       [name] aspirin EC 81 MG tablet Take 81 mg by mouth every other day.      . calcitRIOL (ROCALTROL) 0.25 MCG capsule Take 0.25-0.5 mcg by mouth See admin instructions. Take 1 tablet day 1, take 2 tablet day 2, take 1 tablet day 3, and continue accordingly      . cetirizine (ZYRTEC) 10 MG tablet Take 10 mg by mouth daily.      . cinacalcet (SENSIPAR) 30 MG tablet Take 30 mg by mouth daily.      . furosemide (LASIX) 80 MG tablet Take 80 mg by mouth 4 (four) times daily.       [name] glipiZIDE (GLUCOTROL) 10 MG tablet Take 10 mg by mouth 2 (two) times daily before a meal.      . insulin glargine (LANTUS) 100 UNIT/ML injection Inject 12 Units into the skin at bedtime.      . insulin regular (NOVOLIN R,HUMULIN R) 100 units/mL injection Inject 2-8 Units into the skin 3 (three) times daily as needed. Takes as needed per sliding scale      . metoprolol (LOPRESSOR) 50 MG tablet Take 50 mg by mouth 2 (two) times daily.       . mycophenolate (MYFORTIC) 180 MG  EC tablet Take 360 mg by mouth 2 (two) times daily.      [name] omeprazole (PRILOSEC) 20 MG capsule Take 20 mg by mouth 2 (two) times daily.       . pravastatin (PRAVACHOL) 80 MG tablet Take 80 mg by mouth daily.      . sodium chloride (OCEAN) 0.65 % nasal spray Place 3-4 sprays into the nose at bedtime.      . tacrolimus (PROGRAF) 1 MG capsule Take 3 mg by mouth 2 (two) times daily.      . B-D ULTRAFINE III SHORT PEN 31G X 8 MM MISC         Results for orders placed during the hospital encounter of 04/11/13 (from the past 48 hour(s))  TROPONIN I     Status: None   Collection Time    04/11/13 11:55 AM      Result Value Range   Troponin I <0.30  <0.30 ng/mL   Comment:            Due to the release kinetics of cTnI,     a negative result within the first hours     of the onset of symptoms does not rule out     myocardial infarction with certainty.     If myocardial infarction is still suspected,     repeat the test at appropriate intervals.  CBC WITH  DIFFERENTIAL     Status: Abnormal   Collection Time    04/11/13 11:55 AM      Result Value Range   WBC 12.6 (*) 4.0 - 10.5 K/uL   RBC 5.00  4.22 - 5.81 MIL/uL   Hemoglobin 14.5  13.0 - 17.0 g/dL   HCT 47.8  39.0 - 52.0 %   MCV 95.6  78.0 - 100.0 fL   MCH 29.0  26.0 - 34.0 pg   MCHC 30.3  30.0 - 36.0 g/dL   RDW 16.7 (*) 11.5 - 15.5 %   Platelets 218  150 - 400 K/uL   Neutrophils Relative 92 (*) 43 - 77 %   Neutro Abs 11.7 (*) 1.7 - 7.7 K/uL   Lymphocytes Relative 3 (*) 12 - 46 %   Lymphs Abs 0.3 (*) 0.7 - 4.0 K/uL   Monocytes Relative 5  3 - 12 %   Monocytes Absolute 0.6  0.1 - 1.0 K/uL   Eosinophils Relative 0  0 - 5 %   Eosinophils Absolute 0.0  0.0 - 0.7 K/uL   Basophils Relative 0  0 - 1 %   Basophils Absolute 0.0  0.0 - 0.1 K/uL  BASIC METABOLIC PANEL     Status: Abnormal   Collection Time    04/11/13 11:55 AM      Result Value Range   Sodium 137  135 - 145 mEq/L   Potassium 5.0  3.5 - 5.1 mEq/L   Chloride 95 (*) 96 - 112 mEq/L   CO2 33 (*) 19 - 32 mEq/L   Glucose, Bld 381 (*) 70 - 99 mg/dL   BUN 56 (*) 6 - 23 mg/dL   Creatinine, Ser 2.72 (*) 0.50 - 1.35 mg/dL   Calcium 9.6  8.4 - 10.5 mg/dL   GFR calc non Af Amer 24 (*) >90 mL/min   GFR calc Af Amer 27 (*) >90 mL/min   Comment:            The eGFR has been calculated     using the CKD EPI equation.  This calculation has not been     validated in all clinical     situations.     eGFR's persistently     <90 mL/min signify     possible Chronic Kidney Disease.  APTT     Status: None   Collection Time    04/11/13 11:55 AM      Result Value Range   aPTT 29  24 - 37 seconds  PROTIME-INR     Status: None   Collection Time    04/11/13 11:55 AM      Result Value Range   Prothrombin Time 14.6  11.6 - 15.2 seconds   INR 1.16  0.00 - 1.49  PRO B NATRIURETIC PEPTIDE     Status: Abnormal   Collection Time    04/11/13 11:56 AM      Result Value Range   Pro B Natriuretic peptide (BNP) 17454.0 (*) 0 - 125 pg/mL   POCT I-STAT 3, BLOOD GAS (G3+)     Status: Abnormal   Collection Time    04/11/13 12:04 PM      Result Value Range   pH, Arterial 7.178 (*) 7.350 - 7.450   pCO2 arterial 94.0 (*) 35.0 - 45.0 mmHg   pO2, Arterial 87.0  80.0 - 100.0 mmHg   Bicarbonate 35.2 (*) 20.0 - 24.0 mEq/L   TCO2 38  0 - 100 mmol/L   O2 Saturation 93.0     Acid-Base Excess 2.0  0.0 - 2.0 mmol/L   Patient temperature 97.5 F     Collection site BRACHIAL ARTERY     Drawn by RT     Sample type ARTERIAL     Comment MD NOTIFIED, SUGGEST RECOLLECT    CG4 I-STAT (LACTIC ACID)     Status: None   Collection Time    04/11/13 12:18 PM      Result Value Range   Lactic Acid, Venous 1.01  0.5 - 2.2 mmol/L  CULTURE, BLOOD (ROUTINE X 2)     Status: None   Collection Time    04/11/13 12:30 PM      Result Value Range   Specimen Description BLOOD RIGHT HAND     Special Requests BOTTLES DRAWN AEROBIC AND ANAEROBIC 10CC     Culture  Setup Time 04/11/2013 17:11     Culture       Value:        BLOOD CULTURE RECEIVED NO GROWTH TO DATE CULTURE WILL BE HELD FOR 5 DAYS BEFORE ISSUING A FINAL NEGATIVE REPORT   Report Status PENDING    CULTURE, BLOOD (ROUTINE X 2)     Status: None   Collection Time    04/11/13 12:40 PM      Result Value Range   Specimen Description BLOOD RIGHT ARM     Special Requests BOTTLES DRAWN AEROBIC ONLY 10CC     Culture  Setup Time 04/11/2013 17:11     Culture       Value:        BLOOD CULTURE RECEIVED NO GROWTH TO DATE CULTURE WILL BE HELD FOR 5 DAYS BEFORE ISSUING A FINAL NEGATIVE REPORT   Report Status PENDING    PROCALCITONIN     Status: None   Collection Time    04/11/13  3:42 PM      Result Value Range   Procalcitonin 0.20     Comment:            Interpretation:     PCT (Procalcitonin) <= 0.5  ng/mL:     Systemic infection (sepsis) is not likely.     Local bacterial infection is possible.     (NOTE)             ICU PCT Algorithm               Non ICU PCT Algorithm         ----------------------------     ------------------------------             PCT < 0.25 ng/mL                 PCT < 0.1 ng/mL         Stopping of antibiotics            Stopping of antibiotics           strongly encouraged.               strongly encouraged.        ----------------------------     ------------------------------           PCT level decrease by               PCT < 0.25 ng/mL           >= 80% from peak PCT           OR PCT 0.25 - 0.5 ng/mL          Stopping of antibiotics                                                 encouraged.         Stopping of antibiotics               encouraged.        ----------------------------     ------------------------------           PCT level decrease by              PCT >= 0.25 ng/mL           < 80% from peak PCT            AND PCT >= 0.5 ng/mL            Continuing antibiotics                                                  encouraged.           Continuing antibiotics                encouraged.        ----------------------------     ------------------------------         PCT level increase compared          PCT > 0.5 ng/mL             with peak PCT AND              PCT >= 0.5 ng/mL             Escalation of antibiotics  strongly encouraged.          Escalation of antibiotics            strongly encouraged.  CULTURE, BAL-QUANTITATIVE     Status: None   Collection Time    04/11/13  3:49 PM      Result Value Range   Specimen Description BRONCHIAL ALVEOLAR LAVAGE     Special Requests NONE     Gram Stain PENDING     Colony Count PENDING     Culture Culture reincubated for better growth     Report Status PENDING    URINALYSIS, ROUTINE W REFLEX MICROSCOPIC     Status: Abnormal   Collection Time    04/11/13  3:50 PM      Result Value Range   Color, Urine YELLOW  YELLOW   APPearance CLOUDY (*) CLEAR   Specific Gravity, Urine 1.019  1.005 - 1.030   pH 5.0  5.0 - 8.0   Glucose, UA NEGATIVE   NEGATIVE mg/dL   Hgb urine dipstick SMALL (*) NEGATIVE   Bilirubin Urine NEGATIVE  NEGATIVE   Ketones, ur NEGATIVE  NEGATIVE mg/dL   Protein, ur 100 (*) NEGATIVE mg/dL   Urobilinogen, UA 0.2  0.0 - 1.0 mg/dL   Nitrite NEGATIVE  NEGATIVE   Leukocytes, UA NEGATIVE  NEGATIVE  URINE MICROSCOPIC-ADD ON     Status: Abnormal   Collection Time    04/11/13  3:50 PM      Result Value Range   WBC, UA 0-2  <3 WBC/hpf   RBC / HPF 3-6  <3 RBC/hpf   Bacteria, UA FEW (*) RARE   Casts HYALINE CASTS (*) NEGATIVE  POCT I-STAT 3, BLOOD GAS (G3+)     Status: Abnormal   Collection Time    04/11/13  4:06 PM      Result Value Range   pH, Arterial 7.311 (*) 7.350 - 7.450   pCO2 arterial 60.7 (*) 35.0 - 45.0 mmHg   pO2, Arterial 97.0  80.0 - 100.0 mmHg   Bicarbonate 30.9 (*) 20.0 - 24.0 mEq/L   TCO2 33  0 - 100 mmol/L   O2 Saturation 97.0     Acid-Base Excess 2.0  0.0 - 2.0 mmol/L   Patient temperature 97.5 F     Collection site BRACHIAL ARTERY     Drawn by RT     Sample type ARTERIAL     Comment NOTIFIED PHYSICIAN    GLUCOSE, CAPILLARY     Status: Abnormal   Collection Time    04/11/13  4:37 PM      Result Value Range   Glucose-Capillary 333 (*) 70 - 99 mg/dL  MRSA PCR SCREENING     Status: None   Collection Time    04/11/13  4:39 PM      Result Value Range   MRSA by PCR NEGATIVE  NEGATIVE   Comment:            The GeneXpert MRSA Assay (FDA     approved for NASAL specimens     only), is one component of a     comprehensive MRSA colonization     surveillance program. It is not     intended to diagnose MRSA     infection nor to guide or     monitor treatment for     MRSA infections.  STREP PNEUMONIAE URINARY ANTIGEN     Status: None   Collection Time    04/11/13  4:52 PM  Result Value Range   Strep Pneumo Urinary Antigen NEGATIVE  NEGATIVE   Comment:            Infection due to S. pneumoniae     cannot be absolutely ruled out     since the antigen present     may be below the  detection limit     of the test.  LEGIONELLA ANTIGEN, URINE     Status: None   Collection Time    04/11/13  4:52 PM      Result Value Range   Specimen Description URINE, CATHETERIZED     Special Requests NONE     Legionella Antigen, Urine Negative for Legionella pneumophilia serogroup 1     Report Status 04/12/2013 FINAL    GLUCOSE, CAPILLARY     Status: Abnormal   Collection Time    04/11/13  7:13 PM      Result Value Range   Glucose-Capillary 285 (*) 70 - 99 mg/dL   Comment 1 Notify RN    RENAL FUNCTION PANEL     Status: Abnormal   Collection Time    04/11/13 10:16 PM      Result Value Range   Sodium 139  135 - 145 mEq/L   Potassium 3.8  3.5 - 5.1 mEq/L   Chloride 98  96 - 112 mEq/L   CO2 33 (*) 19 - 32 mEq/L   Glucose, Bld 150 (*) 70 - 99 mg/dL   BUN 58 (*) 6 - 23 mg/dL   Creatinine, Ser 2.66 (*) 0.50 - 1.35 mg/dL   Calcium 9.3  8.4 - 10.5 mg/dL   Phosphorus 1.9 (*) 2.3 - 4.6 mg/dL   Albumin 3.0 (*) 3.5 - 5.2 g/dL   GFR calc non Af Amer 24 (*) >90 mL/min   GFR calc Af Amer 28 (*) >90 mL/min   Comment:            The eGFR has been calculated     using the CKD EPI equation.     This calculation has not been     validated in all clinical     situations.     eGFR's persistently     <90 mL/min signify     possible Chronic Kidney Disease.  HEPATIC FUNCTION PANEL     Status: Abnormal   Collection Time    04/11/13 10:16 PM      Result Value Range   Total Protein 5.5 (*) 6.0 - 8.3 g/dL   Albumin 3.0 (*) 3.5 - 5.2 g/dL   AST 37  0 - 37 U/L   ALT 68 (*) 0 - 53 U/L   Alkaline Phosphatase 113  39 - 117 U/L   Total Bilirubin 0.5  0.3 - 1.2 mg/dL   Bilirubin, Direct 0.1  0.0 - 0.3 mg/dL   Indirect Bilirubin 0.4  0.3 - 0.9 mg/dL  GLUCOSE, CAPILLARY     Status: Abnormal   Collection Time    04/11/13 11:43 PM      Result Value Range   Glucose-Capillary 104 (*) 70 - 99 mg/dL  GLUCOSE, CAPILLARY     Status: None   Collection Time    04/12/13  3:37 AM      Result Value  Range   Glucose-Capillary 76  70 - 99 mg/dL  COMPREHENSIVE METABOLIC PANEL     Status: Abnormal   Collection Time    04/12/13  4:00 AM      Result Value Range   Sodium 142  135 - 145 mEq/L   Potassium 3.5  3.5 - 5.1 mEq/L   Chloride 100  96 - 112 mEq/L   CO2 30  19 - 32 mEq/L   Glucose, Bld 73  70 - 99 mg/dL   BUN 56 (*) 6 - 23 mg/dL   Creatinine, Ser 2.68 (*) 0.50 - 1.35 mg/dL   Calcium 9.6  8.4 - 10.5 mg/dL   Total Protein 5.6 (*) 6.0 - 8.3 g/dL   Albumin 3.1 (*) 3.5 - 5.2 g/dL   AST 26  0 - 37 U/L   ALT 61 (*) 0 - 53 U/L   Alkaline Phosphatase 111  39 - 117 U/L   Total Bilirubin 0.6  0.3 - 1.2 mg/dL   GFR calc non Af Amer 24 (*) >90 mL/min   GFR calc Af Amer 28 (*) >90 mL/min   Comment:            The eGFR has been calculated     using the CKD EPI equation.     This calculation has not been     validated in all clinical     situations.     eGFR's persistently     <90 mL/min signify     possible Chronic Kidney Disease.  CBC     Status: Abnormal   Collection Time    04/12/13  4:00 AM      Result Value Range   WBC 11.8 (*) 4.0 - 10.5 K/uL   RBC 4.34  4.22 - 5.81 MIL/uL   Hemoglobin 12.3 (*) 13.0 - 17.0 g/dL   HCT 39.0  39.0 - 52.0 %   MCV 89.9  78.0 - 100.0 fL   MCH 28.3  26.0 - 34.0 pg   MCHC 31.5  30.0 - 36.0 g/dL   RDW 16.4 (*) 11.5 - 15.5 %   Platelets 176  150 - 400 K/uL  APTT     Status: None   Collection Time    04/12/13  4:00 AM      Result Value Range   aPTT 29  24 - 37 seconds  PROTIME-INR     Status: Abnormal   Collection Time    04/12/13  4:00 AM      Result Value Range   Prothrombin Time 15.3 (*) 11.6 - 15.2 seconds   INR 1.23  0.00 - 1.49  PROCALCITONIN     Status: None   Collection Time    04/12/13  4:00 AM      Result Value Range   Procalcitonin 0.37     Comment:            Interpretation:     PCT (Procalcitonin) <= 0.5 ng/mL:     Systemic infection (sepsis) is not likely.     Local bacterial infection is possible.     (NOTE)              ICU PCT Algorithm               Non ICU PCT Algorithm        ----------------------------     ------------------------------             PCT < 0.25 ng/mL                 PCT < 0.1 ng/mL         Stopping of antibiotics            Stopping of antibiotics  strongly encouraged.               strongly encouraged.        ----------------------------     ------------------------------           PCT level decrease by               PCT < 0.25 ng/mL           >= 80% from peak PCT           OR PCT 0.25 - 0.5 ng/mL          Stopping of antibiotics                                                 encouraged.         Stopping of antibiotics               encouraged.        ----------------------------     ------------------------------           PCT level decrease by              PCT >= 0.25 ng/mL           < 80% from peak PCT            AND PCT >= 0.5 ng/mL            Continuing antibiotics                                                  encouraged.           Continuing antibiotics                encouraged.        ----------------------------     ------------------------------         PCT level increase compared          PCT > 0.5 ng/mL             with peak PCT AND              PCT >= 0.5 ng/mL             Escalation of antibiotics                                              strongly encouraged.          Escalation of antibiotics            strongly encouraged.  PHOSPHORUS     Status: Abnormal   Collection Time    04/12/13  4:00 AM      Result Value Range   Phosphorus 1.9 (*) 2.3 - 4.6 mg/dL  MAGNESIUM     Status: None   Collection Time    04/12/13  4:00 AM      Result Value Range   Magnesium 1.9  1.5 - 2.5 mg/dL  TROPONIN I     Status: Abnormal   Collection Time    04/12/13  5:02 AM  Result Value Range   Troponin I 1.16 (*) <0.30 ng/mL   Comment:            Due to the release kinetics of cTnI,     a negative result within the first hours     of the onset of symptoms  does not rule out     myocardial infarction with certainty.     If myocardial infarction is still suspected,     repeat the test at appropriate intervals.     CRITICAL RESULT CALLED TO, READ BACK BY AND VERIFIED WITH:     Bonnita Nasuti RN [phone] EBANKS COLCLOUGH, S  PRO B NATRIURETIC PEPTIDE     Status: Abnormal   Collection Time    04/12/13  5:02 AM      Result Value Range   Pro B Natriuretic peptide (BNP) 16846.0 (*) 0 - 125 pg/mL  BLOOD GAS, ARTERIAL     Status: Abnormal   Collection Time    04/12/13  5:14 AM      Result Value Range   FIO2 0.40     Delivery systems VENTILATOR     Mode PRESSURE REGULATED VOLUME CONTROL     VT 500     Rate 16     Peep/cpap 5.0     pH, Arterial 7.464 (*) 7.350 - 7.450   pCO2 arterial 41.6  35.0 - 45.0 mmHg   pO2, Arterial 69.4 (*) 80.0 - 100.0 mmHg   Bicarbonate 29.5 (*) 20.0 - 24.0 mEq/L   TCO2 30.7  0 - 100 mmol/L   Acid-Base Excess 5.7 (*) 0.0 - 2.0 mmol/L   O2 Saturation 94.9     Patient temperature 98.6     Collection site BRACHIAL ARTERY     Drawn by [id number]     Sample type ARTERIAL DRAW    GLUCOSE, CAPILLARY     Status: None   Collection Time    04/12/13  7:56 AM      Result Value Range   Glucose-Capillary 80  70 - 99 mg/dL  TROPONIN I     Status: Abnormal   Collection Time    04/12/13  8:05 AM      Result Value Range   Troponin I 0.86 (*) <0.30 ng/mL   Comment:            Due to the release kinetics of cTnI,     a negative result within the first hours     of the onset of symptoms does not rule out     myocardial infarction with certainty.     If myocardial infarction is still suspected,     repeat the test at appropriate intervals.     CRITICAL VALUE NOTED.  VALUE IS CONSISTENT WITH PREVIOUSLY REPORTED AND CALLED VALUE.    Dg Chest Port 1 View  04/12/2013  *RADIOLOGY REPORT*  Clinical Data: Respiratory failure.  Shortness of breath.  PORTABLE CHEST - 1 VIEW  Comparison: Chest 04/11/2013 and 04/05/2013  Findings: Support  tubes and lines are unchanged.  There has been interval increase in bilateral pleural effusions and basilar airspace disease.  Pulmonary edema appears unchanged.  IMPRESSION: Increased bilateral pleural effusions and basilar airspace disease. No marked change in pulmonary edema.   Original Report Authenticated By: Orlean Patten, M.D.    Dg Chest Portable 1 View  04/11/2013  *RADIOLOGY REPORT*  Clinical Data: Post intubation and central line placement.  PORTABLE CHEST - 1 VIEW  Comparison: 04/11/2013  Findings:  Endotracheal tube is 3.4 cm above the carina.  Right central line tip is at the cavoatrial junction.  No pneumothorax. Low lung volumes with bilateral airspace opacities, likely edema. Mild cardiomegaly.  Suspect small effusions.  NG tube is in place within the stomach.  IMPRESSION: Support devices as above.  Continued bilateral airspace opacities, likely edema.  Low volumes with bibasilar atelectasis and small effusions.   Original Report Authenticated By: Rolm Baptise, M.D.    Dg Chest Port 1 View  04/11/2013  *RADIOLOGY REPORT*  Clinical Data: Chest pain, shortness of breath.  PORTABLE CHEST - 1 VIEW  Comparison: 04/05/2013  Findings: Low lung volumes.  Cardiomegaly.  Vascular congestion and diffuse bilateral airspace opacities, likely edema.  Bibasilar atelectasis.  No visible effusions.  IMPRESSION: Low lung volumes with bibasilar atelectasis.  Mild CHF.   Original Report Authenticated By: Rolm Baptise, M.D.     @ROS @ Blood pressure 125/65, pulse 105, temperature 99.2 F (37.3 C), temperature source Oral, resp. rate 24, height 5\' 10"  (1.778 m), weight 102.5 kg (225 lb 15.5 oz), SpO2 100.00%. @PHYSEXAMBYAGE2 @ Physical Examination: General appearance - alert, well appearing, and in no distress, anxious and pale, cooperative Mental status - alert, oriented to person, place, and time Eyes - pupils equal and reactive, extraocular eye movements intact Mouth - mucous membranes moist, pharynx  normal without lesions Neck - adenopathy noted PCL Lymphatics - posterior cervical nodes Chest - rales noted bibasilar, rhonchi noted bilat Heart - S1 and S2 normal, systolic murmur [id number] at apex Abdomen - bowel sounds normal hepatomegaly liver down 5 cm Tx RLQ normal Neurological - alert, oriented, normal speech, no focal findings or movement disorder noted Musculoskeletal - no joint tenderness, deformity or swelling Extremities - pedal edema Tr + Skin - pale, bruises  Assessment/Plan: 1 Renal Transplant with mild ^ Cr.  Suspect infx and lower bp.. Vol xs, needs diursis.  High risk for contrast.  Will check Prograf, and viral serologies 2 Pulm infilt suspect vol, R/O infx\ 3 Hypertension:  Not an issue 4. Pos enz and SVT suspect ischemic 5. Metabolic Bone Disease: not emergent 6 DM controlled 7 Obesity 8 Prostate Ca controlled. P AB, Diuresis, check Prograf, Viral serologies  Usiel Astarita L 04/12/2013, 12:20 PM

## 2013-04-12 NOTE — Progress Notes (Signed)
ANTICOAGULATION CONSULT NOTE - Initial Consult  Pharmacy Consult for heparin Indication: chest pain/ACS  No Known Allergies  Patient Measurements: Height: 5\' 10"  (177.8 cm) Weight: 225 lb 15.5 oz (102.5 kg) IBW/kg (Calculated) : 73 Heparin Dosing Weight: 94kg   Vital Signs: Temp: 99.2 F (37.3 C) (04/21 0841) Temp src: Oral (04/21 0841) BP: 121/59 mmHg (04/21 0800) Pulse Rate: 123 (04/21 0800)  Labs:  Recent Labs  04/11/13 1155 04/11/13 2216 04/12/13 0400 04/12/13 0502  HGB 14.5  --  12.3*  --   HCT 47.8  --  39.0  --   PLT 218  --  176  --   APTT 29  --  29  --   LABPROT 14.6  --  15.3*  --   INR 1.16  --  1.23  --   CREATININE 2.72* 2.66* 2.68*  --   TROPONINI <0.30  --   --  1.16*    Estimated Creatinine Clearance: 34.7 ml/min (by C-G formula based on Cr of 2.68).   Medical History: Past Medical History  Diagnosis Date  . Diabetes mellitus without complication   . Cancer     Prostate cancer  . Renal failure   . Detached retina   . Hypertension   . Cellulitis and abscess of unspecified site 2014    lower back  . ADHD (attention deficit hyperactivity disorder)   . Asthma   . Cellulitis and abscess of trunk 2014    history of back abscess    Medications:  Scheduled:  . antiseptic oral rinse  15 mL Mouth Rinse QID  . [COMPLETED] aspirin  324 mg Oral Once  . aspirin EC  325 mg Oral Daily  . cefTAZidime (FORTAZ)  IV  2 g Intravenous Q12H  . chlorhexidine  15 mL Mouth/Throat BID  . [COMPLETED] etomidate  20 mg Intravenous Once  . [COMPLETED] fentaNYL  50 mcg Intravenous Once  . [COMPLETED] furosemide  40 mg Intravenous STAT  . furosemide  40 mg Intravenous Once  . heparin  5,000 Units Subcutaneous Q8H  . insulin aspart  0-15 Units Subcutaneous Q4H  . [COMPLETED] levofloxacin (LEVAQUIN) IV  750 mg Intravenous Once  . [START ON 04/13/2013] levofloxacin (LEVAQUIN) IV  750 mg Intravenous Q48H  . [COMPLETED] lidocaine (cardiac) 100 mg/67ml      .  metoprolol tartrate  25 mg Oral BID  . [COMPLETED] midazolam  2 mg Intravenous Once  . [COMPLETED] midazolam  2 mg Intravenous Once  . mycophenolate  360 mg Per Tube BID  . pantoprazole  40 mg Oral Daily  . [COMPLETED] rocuronium  30 mg Intravenous Once  . sodium chloride  3 mL Intravenous Q12H  . [COMPLETED] succinylcholine      . tacrolimus  3 mg Oral BID  . vancomycin  1,250 mg Intravenous Q24H  . [COMPLETED] vancomycin  1,000 mg Intravenous Once  . [DISCONTINUED] chlorhexidine  15 mL Mouth/Throat BID  . [DISCONTINUED] mycophenolate  360 mg Per Tube BID  . [DISCONTINUED] mycophenolate  360 mg Oral BID  . [DISCONTINUED] pantoprazole (PROTONIX) IV  40 mg Intravenous Q24H  . [COMPLETED] piperacillin-tazobactam (ZOSYN)  IV  3.375 g Intravenous Once  . [DISCONTINUED] tacrolimus  3 mg Per Tube BID  . [DISCONTINUED] tacrolimus  3 mg Oral BID    Assessment: 62 yo male with elevated troponin and concern for ACS to start heparin. Patient on sq heparin and last dose was 0546am today.  Goal of Therapy:  Heparin level 0.3-0.7 units/ml Monitor  platelets by anticoagulation protocol: Yes   Plan:  -Begin heparin at 1300 units/hr (~14 units/kg/hr) -Heparin level in 8 hours and daily wth CBC daily  Hildred Laser, Pharm D 04/12/2013 9:27 AM

## 2013-04-12 NOTE — Progress Notes (Signed)
CRITICAL VALUE ALERT  Critical value received: Troponin= 1.16  Date of notification: 04-12-13  Time of notification:  [id number]  Critical value read back:yes  Nurse who received alert:  Desmond Dike RN  MD notified (1st page):  Dr. Alfredo Bach  Time of first page:  0540  MD notified (2nd page):  Time of second page:  Responding MD:  Dr. Alfredo Bach  Time MD responded:  [phone]

## 2013-04-12 NOTE — Progress Notes (Addendum)
PULMONARY  / CRITICAL CARE MEDICINE  Name: Russell Bowers MRN: [id number] DOB: Jul 02, 1951    ADMISSION DATE:  04/11/2013   BRIEF PATIENT DESCRIPTION:  39 male with hx of renal transplant 11/2011, intubated by PCCM in ED for acute on chronic hypercarbic respiratory failure after 1-2 months of cough, atypical chest pain and marked increase in DOE over day or two PTA. Bilateral pulmonary infiltrates on CXR. Very similar admission in Feb 2013 @ [facility].   SIGNIFICANT EVENTS / STUDIES:  4/20 admitted with hypercarbic respiratory failure, pulmonary infiltrates, acute on chronic renal insuff, RBBB  LINES / TUBES: ETT 4/20 >>  R IJ CVL 4/20 >>   CULTURES: MRSA PCR 4/20 >> NEG Strep Ag 4/20 >> NEG Legionella Ag 4/20 >>  Urine 4/20 >>  Resp virus panel 4/20 >>  PJP DFA 4/20 >>  BAL cx 4/20 >>  Blood 4/20 >>    ANTIBIOTICS: Vanc 4/20 >>  Cefaz 4/20 >>  Levoflox 4/20 >>   HISTORY OF PRESENT ILLNESS:   As above. Cough productive of scant mucoid secretions. No fever, hemoptysis, pleuritic or anginal CP. Trace LE edema, not changed from baseline. No change in bowel or bladder patterns. No N/V/D. No abdominal pain  SUBJECTIVE:  No overnight events. He feels better today. Fully alert and interactive.  VITAL SIGNS: Temp:  [97 F (36.1 C)-100.2 F (37.9 C)] 99.8 F (37.7 C) (04/21 0700) Pulse Rate:  [75-123] 83 (04/21 0700) Resp:  [16-33] 16 (04/21 0700) BP: (77-174)/(46-87) 109/51 mmHg (04/21 0737) SpO2:  [50 %-100 %] 100 % (04/21 0700) FiO2 (%):  [39.4 %-60 %] 40 % (04/21 0737) Weight:  [225 lb 15.5 oz (102.5 kg)] 225 lb 15.5 oz (102.5 kg) (04/21 0400) HEMODYNAMICS: CVP:  [9 mmHg-11 mmHg] 9 mmHg VENTILATOR SETTINGS: Vent Mode:  [-] CPAP;PSV FiO2 (%):  [39.4 %-60 %] 40 % Set Rate:  [16 bmp] 16 bmp Vt Set:  [500 mL] 500 mL PEEP:  [5 cmH20] 5 cmH20 Pressure Support:  [5 cmH20] 5 cmH20 Plateau Pressure:  [19 cmH20-36 cmH20] 36 cmH20 INTAKE / OUTPUT: Intake/Output     04/20 0701  - 04/21 0700 04/21 0701 - 04/22 0700   I.V. (mL/kg) 246.2 (2.4)    NG/GT 60    IV Piggyback 50    Total Intake(mL/kg) 356.2 (3.5)    Urine (mL/kg/hr) 795    Total Output 795     Net -438.8            PHYSICAL EXAMINATION: General:  Appears comfortable. Fully alert and in no distress Neuro:  RASS 0, interactive  HEENT:  [place]/AT, PERRL, EOMI Cardiovascular:  RRR, no murmurs  Lungs:  Scattered rhonchi anteriorly, bibasilar bronchial BS posteriorly Abdomen:  Mod obese, soft, NT, NABS EXT: warm, trace pretibial edema Skin:  No lesions  LABS:  Recent Labs Lab 04/11/13 1155 04/11/13 1156 04/11/13 1204 04/11/13 1218 04/11/13 1542 04/11/13 1606 04/11/13 2216 04/12/13 0400 04/12/13 0502 04/12/13 0514  HGB 14.5  --   --   --   --   --   --  12.3*  --   --   WBC 12.6*  --   --   --   --   --   --  11.8*  --   --   PLT 218  --   --   --   --   --   --  176  --   --   NA 137  --   --   --   --   --  139 142  --   --   K 5.0  --   --   --   --   --  3.8 3.5  --   --   CL 95*  --   --   --   --   --  98 100  --   --   CO2 33*  --   --   --   --   --  33* 30  --   --   GLUCOSE 381*  --   --   --   --   --  150* 73  --   --   BUN 56*  --   --   --   --   --  58* 56*  --   --   CREATININE 2.72*  --   --   --   --   --  2.66* 2.68*  --   --   CALCIUM 9.6  --   --   --   --   --  9.3 9.6  --   --   MG  --   --   --   --   --   --   --  1.9  --   --   PHOS  --   --   --   --   --   --  1.9* 1.9*  --   --   AST  --   --   --   --   --   --  37 26  --   --   ALT  --   --   --   --   --   --  68* 61*  --   --   ALKPHOS  --   --   --   --   --   --  113 111  --   --   BILITOT  --   --   --   --   --   --  0.5 0.6  --   --   PROT  --   --   --   --   --   --  5.5* 5.6*  --   --   ALBUMIN  --   --   --   --   --   --  3.0*  3.0* 3.1*  --   --   APTT 29  --   --   --   --   --   --  29  --   --   INR 1.16  --   --   --   --   --   --  1.23  --   --   LATICACIDVEN  --   --   --  1.01  --    --   --   --   --   --   TROPONINI <0.30  --   --   --   --   --   --   --  1.16*  --   PROCALCITON  --   --   --   --  0.20  --   --  0.37  --   --   PROBNP  --  17454.0*  --   --   --   --   --   --  16846.0*  --   PHART  --   --  7.178*  --   --  7.311*  --   --   --  7.464*  PCO2ART  --   --  94.0*  --   --  60.7*  --   --   --  41.6  PO2ART  --   --  87.0  --   --  97.0  --   --   --  69.4*    Recent Labs Lab 04/11/13 1637 04/11/13 1913 04/11/13 2343 04/12/13 0337  GLUCAP 333* 285* 104* 76    CXR: Low volumes, ill defined bilateral infiltrates, suspect AB grams in RLL medially  ASSESSMENT / PLAN: Principal Problem:   Acute respiratory failure with hypercapnia Active Problems:   Pulmonary infiltrates   RBBB   History of renal transplantation   Acute on chronic renal failure   Immunocompromised   DM2 (diabetes mellitus, type 2)   Encephalopathy acute   PULMONARY A: Acute hypercarbic resp failure Pulmonary infiltrates - edema vs infection. Worsening bilateral pleural effusion.  P:   -Full vent support and vent bundle currently on FiO2 40, PEEP 5 and 5 above PEEP -Daily WUA/SBT as indicated -Sending cultures for atypical organism in the setting of immune suppression -ABgs better this AM  CARDIOVASCULAR A: Elevated BNP 16846 ( with Renal insufficiency) RBBB - unclear whether old or new Recent atypical CP - Trop initially negative positive today, doubt acute coronary syndrome ECG c/w acute ischemia with rising trop levels P:  -CVP 9. Goal 10-14 on vent -Echo awaiting read -cardiology consultation>>seen by varanasi and apprec. Beta blocker increased to 50mg  bid -continue to cycle markers  RENAL A:  Hx of renal transplant Acute on chronic renal insuff. UOP 795 with Negative 438. P:   Will aim at a negative balance  Monitor BMET Renal to see him   GASTROINTESTINAL A:  No issues P:   start TF consult today   HEMATOLOGIC A:  No issues P:  Monitor CBC  intermittently  INFECTIOUS A:  Immunosuppressed Pulmonary infiltrates, possible PNA. Had 1 dose of 40 mg IV lasix. P:   Micro and abx as above PJP pending  Flu panel pending On Fortaz, Levaquin and Vancomycin     ENDOCRINE A:  DM2 Poor glycemic control P:   SSI ordered  NEUROLOGIC A:  Acute enceph due to hypercarbia P:   Intermittent sedation while intubated  CC40 Signed:  Jessee Avers, MD PGY-1 Internal Medicine Teaching Service Pager: [phone] 04/12/2013, 7:51 AM   Mariel Sleet Beeper  [phone]  Cell  [phone]  If no response or cell goes to voicemail, call beeper [phone]

## 2013-04-12 NOTE — Progress Notes (Signed)
ANTICOAGULATION CONSULT NOTE - Follow up [facility] for heparin Indication: chest pain/ACS  No Known Allergies  Patient Measurements: Height: 5\' 10"  (177.8 cm) Weight: 225 lb 15.5 oz (102.5 kg) IBW/kg (Calculated) : 73 Heparin Dosing Weight: 94kg   Vital Signs: Temp: 99.3 F (37.4 C) (04/21 2100) Temp src: Core (Comment) (04/21 2000) BP: 106/52 mmHg (04/21 2100) Pulse Rate: 79 (04/21 2100)  Labs:  Recent Labs  04/11/13 1155 04/11/13 2216 04/12/13 0400 04/12/13 0502 04/12/13 0805 04/12/13 1312 04/12/13 1653 04/12/13 2105  HGB 14.5  --  12.3*  --   --   --   --   --   HCT 47.8  --  39.0  --   --   --   --   --   PLT 218  --  176  --   --   --   --   --   APTT 29  --  29  --   --   --   --   --   LABPROT 14.6  --  15.3*  --   --   --   --   --   INR 1.16  --  1.23  --   --   --   --   --   HEPARINUNFRC  --   --   --   --   --   --   --  0.18*  CREATININE 2.72* 2.66* 2.68*  --   --   --   --   --   CKTOTAL  --   --   --   --  21  --  23  --   CKMB  --   --   --   --  1.9  --  1.9  --   TROPONINI <0.30  --   --  1.16* 0.86* 0.79*  --   --     Estimated Creatinine Clearance: 34.7 ml/min (by C-G formula based on Cr of 2.68).   Medical History: Past Medical History  Diagnosis Date  . Diabetes mellitus without complication   . Cancer     Prostate cancer  . Renal failure   . Detached retina   . Hypertension   . Cellulitis and abscess of unspecified site 2014    lower back  . ADHD (attention deficit hyperactivity disorder)   . Asthma   . Cellulitis and abscess of trunk 2014    history of back abscess  . Elevated troponin   . Shortness of breath     Medications:  Scheduled:  . antiseptic oral rinse  15 mL Mouth Rinse QID  . aspirin  325 mg Per NG tube Daily  . cefTAZidime (FORTAZ)  IV  2 g Intravenous Q12H  . chlorhexidine  15 mL Mouth/Throat BID  . chlorhexidine  15 mL Mouth Rinse BID  . feeding supplement  60 mL Per Tube TID  .  furosemide  160 mg Intravenous Q12H  . [COMPLETED] furosemide  40 mg Intravenous Once  . insulin aspart  0-15 Units Subcutaneous Q4H  . metoprolol tartrate  50 mg Oral BID  . metronidazole  500 mg Intravenous Q8H  . mycophenolate  360 mg Per Tube BID  . pantoprazole sodium  40 mg Per Tube Q1200  . sodium chloride  3 mL Intravenous Q12H  . tacrolimus  3 mg Oral BID  . vancomycin  1,250 mg Intravenous Q24H  . [DISCONTINUED] antiseptic oral rinse  15 mL Mouth  Rinse QID  . [DISCONTINUED] aspirin EC  325 mg Oral Daily  . [DISCONTINUED] chlorhexidine  15 mL Mouth/Throat BID  . [DISCONTINUED] heparin  5,000 Units Subcutaneous Q8H  . [DISCONTINUED] levofloxacin (LEVAQUIN) IV  750 mg Intravenous Q48H  . [DISCONTINUED] metoprolol tartrate  50 mg Oral BID  . [DISCONTINUED] metoprolol tartrate  25 mg Oral BID  . [DISCONTINUED] pantoprazole  40 mg Oral Q1200  . [DISCONTINUED] pantoprazole (PROTONIX) IV  40 mg Intravenous Q24H  . [COMPLETED] piperacillin-tazobactam (ZOSYN)  IV  3.375 g Intravenous Once    Assessment: 62 yo male with elevated troponin and concern for ACS receiving heparin for anticoagulation. Heparin level is below goal, will increase gtt rate.  Goal of Therapy:  Heparin level 0.3-0.7 units/ml Monitor platelets by anticoagulation protocol: Yes   Plan:  Increase gtt to 1600 units/hr and f/u in am.  Davonna Belling, PharmD, BCPS Pager [phone]   04/12/2013 9:48 PM

## 2013-04-12 NOTE — Progress Notes (Signed)
Echocardiogram 2D Echocardiogram has been performed.  Russell Bowers 04/12/2013, 12:44 PM

## 2013-04-12 NOTE — Progress Notes (Signed)
Utilization review completed.  P.J. Lendell Gallick,RN,BSN Case Manager [phone]  

## 2013-04-12 NOTE — Progress Notes (Signed)
INITIAL NUTRITION ASSESSMENT  DOCUMENTATION CODES Per approved criteria  -Obesity Unspecified   INTERVENTION:  Initiate TF via OGT with Osmolite 1.2 at 20 ml/h, increase by 10 ml every 4 hours to goal rate of 40 ml/h with Prostat 60 ml TID to provide 1752 kcals (23 kcals/kg ideal weight), 143 gm protein (96% estimated needs), 787 ml free water daily.  NUTRITION DIAGNOSIS: Inadequate oral intake related to inability to eat as evidenced by NPO status.   Goal: Enteral nutrition to provide 60-70% of estimated calorie needs (22-25 kcals/kg ideal body weight) and 100% of estimated protein needs, based on ASPEN guidelines for permissive underfeeding in critically ill obese individuals.  Monitor:  TF tolerance/adequacy, weight trend, labs, vent status.  Reason for Assessment: MD Consult for TF initiation and management.  62 y.o. male  Admitting Dx: Acute respiratory failure with hypercapnia  ASSESSMENT: Patient with hx of renal transplant; intubated by PCCM in ED for acute on chronic hypercarbic respiratory failure after 1-2 months of cough, atypical chest pain and marked increase in DOE over day or two PTA. Bilateral pulmonary infiltrates on CXR. Also with acute on chronic renal insufficiency. Nephrology consult pending. Enteral nutrition protocol has been initiated. Nepro TF ordered per protocol. Do not feel that patient needs renal restrictions for feedings at this time. No fluid restriction; potassium and sodium are WNL; phosphorus is low.  Patient is currently intubated on ventilator support.  MV: 7.9 Temp:Temp (24hrs), Avg:99.5 F (37.5 C), Min:97 F (36.1 C), Max:100.2 F (37.9 C)    Height: Ht Readings from Last 1 Encounters:  04/11/13 5\' 10"  (1.778 m)    Weight: Wt Readings from Last 1 Encounters:  04/12/13 225 lb 15.5 oz (102.5 kg)    Ideal Body Weight: 75.5 kg  % Ideal Body Weight: 136%  Wt Readings from Last 10 Encounters:  04/12/13 225 lb 15.5 oz (102.5 kg)   04/01/13 220 lb 12.8 oz (100.154 kg)  03/16/13 218 lb 12.8 oz (99.247 kg)    Usual Body Weight: 218-220 lb  % Usual Body Weight: 102%  BMI:  Body mass index is 32.42 kg/(m^2).  Estimated Nutritional Needs: Kcal: 1950  Permissive underfeeding goal = [id number] kcals Protein: 150 gm Fluid: 2-2.2 L  Skin: unstageable sacral wound present on admission-abscess that has been drained per RN  Diet Order:  NPO  EDUCATION NEEDS: -Education not appropriate at this time   Intake/Output Summary (Last 24 hours) at 04/12/13 1000 Last data filed at 04/12/13 0900  Gross per 24 hour  Intake 416.17 ml  Output    970 ml  Net -553.83 ml     Labs:   Recent Labs Lab 04/11/13 1155 04/11/13 2216 04/12/13 0400  NA 137 139 142  K 5.0 3.8 3.5  CL 95* 98 100  CO2 33* 33* 30  BUN 56* 58* 56*  CREATININE 2.72* 2.66* 2.68*  CALCIUM 9.6 9.3 9.6  MG  --   --  1.9  PHOS  --  1.9* 1.9*  GLUCOSE 381* 150* 73    CBG (last 3)   Recent Labs  04/11/13 2343 04/12/13 0337 04/12/13 0756  GLUCAP 104* 76 80    Scheduled Meds: . antiseptic oral rinse  15 mL Mouth Rinse QID  . aspirin EC  325 mg Oral Daily  . cefTAZidime (FORTAZ)  IV  2 g Intravenous Q12H  . chlorhexidine  15 mL Mouth/Throat BID  . furosemide  40 mg Intravenous Once  . insulin aspart  0-15 Units Subcutaneous Q4H  . [  START ON 04/13/2013] levofloxacin (LEVAQUIN) IV  750 mg Intravenous Q48H  . metoprolol tartrate  25 mg Oral BID  . mycophenolate  360 mg Per Tube BID  . pantoprazole  40 mg Oral Q1200  . sodium chloride  3 mL Intravenous Q12H  . tacrolimus  3 mg Oral BID  . vancomycin  1,250 mg Intravenous Q24H    Continuous Infusions: . feeding supplement (NEPRO CARB STEADY)    . heparin      Past Medical History  Diagnosis Date  . Diabetes mellitus without complication   . Cancer     Prostate cancer  . Renal failure   . Detached retina   . Hypertension   . Cellulitis and abscess of unspecified site 2014     lower back  . ADHD (attention deficit hyperactivity disorder)   . Asthma   . Cellulitis and abscess of trunk 2014    history of back abscess    Past Surgical History  Procedure Laterality Date  . Kidney transplant  12/01/2011  . Prostectomy  11/2010  . Retinal detachment surgery  06/2002    Molli Barrows, RD, LDN, [place] Pager [phone] After Hours Pager [phone]

## 2013-04-12 NOTE — Consult Note (Signed)
[facility]    Date of Admission:  04/11/2013  Date of Consult:  04/12/2013  Reason for Consult: HCAP, possible PNA in renal transplant pt Referring Physician: Dr. Joya Gaskins  HPI: Russell Bowers is an 62 y.o. male hx ESRD from IgA nephropathy and Type 2 DM, status post had a very renal transplantation in December of 2012. Patient came to the emergency department with history of worsening dyspnea on exertion , 1-2 months of cough and atypical chest pain and was found to be hypercapnic respiratory failure. He had bilateral straight seen on chest x-ray and was intubated in the emergency room by critical care medicine. Has had bronchoalveolar lavage obtained and sent for culture along with respiratory virus panel. His urine Legionella antigen and pneumococcal antigens have been sent had been found to be negative. His PCP smears also been sent. He has been started on vancomycin levofloxacin and Fortaz. He currently remains on the ventilator although he appears to be improving.   Upon reading tranplant notes from [facility] pt had hx post transplant in 01/2012 with hypoxia and requiring intubation . [name] He was placed on Vancomycin, azithromycin, fuconazole. Bronch was unrevealing. Cultures were negative. CHe was discharged on 02/08/12 with Z-pack and Cipro orally. On 02/12/2012 he was readmitted with similar respiratory symptoms. He again required intubation x 2 days. He was extubated by 2/23. Again cultures remained negative. He completed a course of vanc/zosyn. On 3/1 he was discharged on oxygen.  His donor was CMV positive while the pt was CMV negative. Otherwise post transplant course was complicated by delayed graft fxn requring 2 sessions of HD in Feb 2013  Patient is on the ventilator and appears relatively comfortable the circumstances. He endorses chest pain but no other recent symptoms. He lives here in her bed in [place]. He does not have any pets and specifically his pet  birds. He had no history of exposure to tuberculosis.  He has had a contrast a recent bout with a soft tissue infection that was debridement by central, [place] surgery having had a back abscess for 2 months that had failed IV antibiotics ( i do not know which ones were used) and US guided aspiration. CCS performed I and D on 03/16/13 and culture yielded peptostreptococcus and he was given clindamycin orally for this.    On exam today he has a little indurated area around the wound it was I&D. Is not frankly fluctuant or purulent but is something we'll need to keep monitoring.  I spent greater than 60 minutes with the patient including greater than 50% of time in face to face counsel of the patient and in coordination of their care.       Past Medical History  Diagnosis Date  . Diabetes mellitus without complication   . Cancer     Prostate cancer  . Renal failure   . Detached retina   . Hypertension   . Cellulitis and abscess of unspecified site 2014    lower back  . ADHD (attention deficit hyperactivity disorder)   . Asthma   . Cellulitis and abscess of trunk 2014    history of back abscess  . Elevated troponin   . Shortness of breath     Past Surgical History  Procedure Laterality Date  . Kidney transplant  12/01/2011  . Prostectomy  11/2010  . Retinal detachment surgery  06/2002  ergies:   No Known Allergies   Medications: I have reviewed patients current medications  as documented in Epic Anti-infectives   Start     Dose/Rate Route Frequency Ordered Stop   04/13/13 1600  levofloxacin (LEVAQUIN) IVPB 750 mg  Status:  Discontinued     750 mg 100 mL/hr over 90 Minutes Intravenous Every 48 hours 04/11/13 1559 04/12/13 1342   04/12/13 1400  vancomycin (VANCOCIN) 1,250 mg in sodium chloride 0.9 % 250 mL IVPB     1,250 mg 166.7 mL/hr over 90 Minutes Intravenous Every 24 hours 04/11/13 1557     04/12/13 1400  metroNIDAZOLE (FLAGYL) IVPB 500 mg     500 mg 100 mL/hr over 60  Minutes Intravenous Every 8 hours 04/12/13 1342     04/11/13 1800  cefTAZidime (FORTAZ) 2 g in dextrose 5 % 50 mL IVPB     2 g 100 mL/hr over 30 Minutes Intravenous Every 12 hours 04/11/13 1557     04/11/13 1345  levofloxacin (LEVAQUIN) IVPB 750 mg     750 mg 100 mL/hr over 90 Minutes Intravenous  Once 04/11/13 1330 04/11/13 1725   04/11/13 1345  vancomycin (VANCOCIN) IVPB 1000 mg/200 mL premix     1,000 mg 200 mL/hr over 60 Minutes Intravenous  Once 04/11/13 1330 04/11/13 1455   04/11/13 1345  piperacillin-tazobactam (ZOSYN) IVPB 3.375 g  Status:  Discontinued     3.375 g 12.5 mL/hr over 240 Minutes Intravenous  Once 04/11/13 1330 04/11/13 2250      Social History:  reports that he quit smoking about 20 years ago. He does not have any smokeless tobacco history on file. He reports that  drinks alcohol. His drug history is not on file.  Family History  Problem Relation Age of Onset  . Diabetes Mother     As in HPI and primary teams notes otherwise 12 point review of systems is negative  Blood pressure 113/70, pulse 85, temperature 99.4 F (37.4 C), temperature source Oral, resp. rate 22, height 5\' 10"  (1.778 m), weight 225 lb 15.5 oz (102.5 kg), SpO2 100.00%. General: Alert and awake, oriented x3,intubated HEENT: anicteric sclera, pupils reactive to light and accommodation intubated CVS regular rate, normal r,  no murmur rubs or gallops Chest:coarse breath sounds throughout Abdomen: soft nontender, nondistended, normal bowel sounds, Extremities: no  clubbing or edema noted bilaterally Skin: no rashes, is an area of induration and slight erythema aureus at the prior incision and debridement performed in his lower back. No obvious abscess is palpable. Neuro: nonfocal, strength and sensation intact   Results for orders placed during the hospital encounter of 04/11/13 (from the past 48 hour(s))  TROPONIN I     Status: None   Collection Time    04/11/13 11:55 AM      Result Value  Range   Troponin I <0.30  <0.30 ng/mL   Comment:            Due to the release kinetics of cTnI,     a negative result within the first hours     of the onset of symptoms does not rule out     myocardial infarction with certainty.     If myocardial infarction is still suspected,     repeat the test at appropriate intervals.  CBC WITH DIFFERENTIAL     Status: Abnormal   Collection Time    04/11/13 11:55 AM      Result Value Range   WBC 12.6 (*) 4.0 - 10.5 K/uL   RBC 5.00  4.22 - 5.81 MIL/uL   Hemoglobin 14.5  13.0 - 17.0 g/dL   HCT 47.8  39.0 - 52.0 %   MCV 95.6  78.0 - 100.0 fL   MCH 29.0  26.0 - 34.0 pg   MCHC 30.3  30.0 - 36.0 g/dL   RDW 16.7 (*) 11.5 - 15.5 %   Platelets 218  150 - 400 K/uL   Neutrophils Relative 92 (*) 43 - 77 %   Neutro Abs 11.7 (*) 1.7 - 7.7 K/uL   Lymphocytes Relative 3 (*) 12 - 46 %   Lymphs Abs 0.3 (*) 0.7 - 4.0 K/uL   Monocytes Relative 5  3 - 12 %   Monocytes Absolute 0.6  0.1 - 1.0 K/uL   Eosinophils Relative 0  0 - 5 %   Eosinophils Absolute 0.0  0.0 - 0.7 K/uL   Basophils Relative 0  0 - 1 %   Basophils Absolute 0.0  0.0 - 0.1 K/uL  BASIC METABOLIC PANEL     Status: Abnormal   Collection Time    04/11/13 11:55 AM      Result Value Range   Sodium 137  135 - 145 mEq/L   Potassium 5.0  3.5 - 5.1 mEq/L   Chloride 95 (*) 96 - 112 mEq/L   CO2 33 (*) 19 - 32 mEq/L   Glucose, Bld 381 (*) 70 - 99 mg/dL   BUN 56 (*) 6 - 23 mg/dL   Creatinine, Ser 2.72 (*) 0.50 - 1.35 mg/dL   Calcium 9.6  8.4 - 10.5 mg/dL   GFR calc non Af Amer 24 (*) >90 mL/min   GFR calc Af Amer 27 (*) >90 mL/min   Comment:            The eGFR has been calculated     using the CKD EPI equation.     This calculation has not been     validated in all clinical     situations.     eGFR's persistently     <90 mL/min signify     possible Chronic Kidney Disease.  APTT     Status: None   Collection Time    04/11/13 11:55 AM      Result Value Range   aPTT 29  24 - 37 seconds    PROTIME-INR     Status: None   Collection Time    04/11/13 11:55 AM      Result Value Range   Prothrombin Time 14.6  11.6 - 15.2 seconds   INR 1.16  0.00 - 1.49  PRO B NATRIURETIC PEPTIDE     Status: Abnormal   Collection Time    04/11/13 11:56 AM      Result Value Range   Pro B Natriuretic peptide (BNP) 17454.0 (*) 0 - 125 pg/mL  POCT I-STAT 3, BLOOD GAS (G3+)     Status: Abnormal   Collection Time    04/11/13 12:04 PM      Result Value Range   pH, Arterial 7.178 (*) 7.350 - 7.450   pCO2 arterial 94.0 (*) 35.0 - 45.0 mmHg   pO2, Arterial 87.0  80.0 - 100.0 mmHg   Bicarbonate 35.2 (*) 20.0 - 24.0 mEq/L   TCO2 38  0 - 100 mmol/L   O2 Saturation 93.0     Acid-Base Excess 2.0  0.0 - 2.0 mmol/L   Patient temperature 97.5 F     Collection site BRACHIAL ARTERY     Drawn by RT     Sample type ARTERIAL  Comment MD NOTIFIED, SUGGEST RECOLLECT    CG4 I-STAT (LACTIC ACID)     Status: None   Collection Time    04/11/13 12:18 PM      Result Value Range   Lactic Acid, Venous 1.01  0.5 - 2.2 mmol/L  CULTURE, BLOOD (ROUTINE X 2)     Status: None   Collection Time    04/11/13 12:30 PM      Result Value Range   Specimen Description BLOOD RIGHT HAND     Special Requests BOTTLES DRAWN AEROBIC AND ANAEROBIC 10CC     Culture  Setup Time 04/11/2013 17:11     Culture       Value:        BLOOD CULTURE RECEIVED NO GROWTH TO DATE CULTURE WILL BE HELD FOR 5 DAYS BEFORE ISSUING A FINAL NEGATIVE REPORT   Report Status PENDING    CULTURE, BLOOD (ROUTINE X 2)     Status: None   Collection Time    04/11/13 12:40 PM      Result Value Range   Specimen Description BLOOD RIGHT ARM     Special Requests BOTTLES DRAWN AEROBIC ONLY 10CC     Culture  Setup Time 04/11/2013 17:11     Culture       Value:        BLOOD CULTURE RECEIVED NO GROWTH TO DATE CULTURE WILL BE HELD FOR 5 DAYS BEFORE ISSUING A FINAL NEGATIVE REPORT   Report Status PENDING    PROCALCITONIN     Status: None   Collection Time     04/11/13  3:42 PM      Result Value Range   Procalcitonin 0.20     Comment:            Interpretation:     PCT (Procalcitonin) <= 0.5 ng/mL:     Systemic infection (sepsis) is not likely.     Local bacterial infection is possible.     (NOTE)             ICU PCT Algorithm               Non ICU PCT Algorithm        ----------------------------     ------------------------------             PCT < 0.25 ng/mL                 PCT < 0.1 ng/mL         Stopping of antibiotics            Stopping of antibiotics           strongly encouraged.               strongly encouraged.        ----------------------------     ------------------------------           PCT level decrease by               PCT < 0.25 ng/mL           >= 80% from peak PCT           OR PCT 0.25 - 0.5 ng/mL          Stopping of antibiotics  encouraged.         Stopping of antibiotics               encouraged.        ----------------------------     ------------------------------           PCT level decrease by              PCT >= 0.25 ng/mL           < 80% from peak PCT            AND PCT >= 0.5 ng/mL            Continuing antibiotics                                                  encouraged.           Continuing antibiotics                encouraged.        ----------------------------     ------------------------------         PCT level increase compared          PCT > 0.5 ng/mL             with peak PCT AND              PCT >= 0.5 ng/mL             Escalation of antibiotics                                              strongly encouraged.          Escalation of antibiotics            strongly encouraged.  PNEUMOCYSTIS JIROVECI SMEAR BY DFA     Status: None   Collection Time    04/11/13  3:49 PM      Result Value Range   Specimen Source-[facility] BRONCHIAL ALVEOLAR LAVAGE     Pneumocystis jiroveci Ag NEGATIVE     Comment: Performed at [place],  BAL-QUANTITATIVE     Status: None   Collection Time    04/11/13  3:49 PM      Result Value Range   Specimen Description BRONCHIAL ALVEOLAR LAVAGE     Special Requests NONE     Gram Stain PENDING     Colony Count PENDING     Culture Culture reincubated for better growth     Report Status PENDING    URINALYSIS, ROUTINE W REFLEX MICROSCOPIC     Status: Abnormal   Collection Time    04/11/13  3:50 PM      Result Value Range   Color, Urine YELLOW  YELLOW   APPearance CLOUDY (*) CLEAR   Specific Gravity, Urine 1.019  1.005 - 1.030   pH 5.0  5.0 - 8.0   Glucose, UA NEGATIVE  NEGATIVE mg/dL   Hgb urine dipstick SMALL (*) NEGATIVE   Bilirubin Urine NEGATIVE  NEGATIVE   Ketones, ur NEGATIVE  NEGATIVE mg/dL   Protein, ur 100 (*) NEGATIVE mg/dL   Urobilinogen, UA 0.2  0.0 - 1.0 mg/dL   Nitrite  NEGATIVE  NEGATIVE   Leukocytes, UA NEGATIVE  NEGATIVE  URINE MICROSCOPIC-ADD ON     Status: Abnormal   Collection Time    04/11/13  3:50 PM      Result Value Range   WBC, UA 0-2  <3 WBC/hpf   RBC / HPF 3-6  <3 RBC/hpf   Bacteria, UA FEW (*) RARE   Casts HYALINE CASTS (*) NEGATIVE  POCT I-STAT 3, BLOOD GAS (G3+)     Status: Abnormal   Collection Time    04/11/13  4:06 PM      Result Value Range   pH, Arterial 7.311 (*) 7.350 - 7.450   pCO2 arterial 60.7 (*) 35.0 - 45.0 mmHg   pO2, Arterial 97.0  80.0 - 100.0 mmHg   Bicarbonate 30.9 (*) 20.0 - 24.0 mEq/L   TCO2 33  0 - 100 mmol/L   O2 Saturation 97.0     Acid-Base Excess 2.0  0.0 - 2.0 mmol/L   Patient temperature 97.5 F     Collection site BRACHIAL ARTERY     Drawn by RT     Sample type ARTERIAL     Comment NOTIFIED PHYSICIAN    GLUCOSE, CAPILLARY     Status: Abnormal   Collection Time    04/11/13  4:37 PM      Result Value Range   Glucose-Capillary 333 (*) 70 - 99 mg/dL  MRSA PCR SCREENING     Status: None   Collection Time    04/11/13  4:39 PM      Result Value Range   MRSA by PCR NEGATIVE  NEGATIVE   Comment:            The  GeneXpert MRSA Assay (FDA     approved for NASAL specimens     only), is one component of a     comprehensive MRSA colonization     surveillance program. It is not     intended to diagnose MRSA     infection nor to guide or     monitor treatment for     MRSA infections.  STREP PNEUMONIAE URINARY ANTIGEN     Status: None   Collection Time    04/11/13  4:52 PM      Result Value Range   Strep Pneumo Urinary Antigen NEGATIVE  NEGATIVE   Comment:            Infection due to S. pneumoniae     cannot be absolutely ruled out     since the antigen present     may be below the detection limit     of the test.  LEGIONELLA ANTIGEN, URINE     Status: None   Collection Time    04/11/13  4:52 PM      Result Value Range   Specimen Description URINE, CATHETERIZED     Special Requests NONE     Legionella Antigen, Urine Negative for Legionella pneumophilia serogroup 1     Report Status 04/12/2013 FINAL    GLUCOSE, CAPILLARY     Status: Abnormal   Collection Time    04/11/13  7:13 PM      Result Value Range   Glucose-Capillary 285 (*) 70 - 99 mg/dL   Comment 1 Notify RN    RENAL FUNCTION PANEL     Status: Abnormal   Collection Time    04/11/13 10:16 PM      Result Value Range   Sodium 139  135 - 145 mEq/L  Potassium 3.8  3.5 - 5.1 mEq/L   Chloride 98  96 - 112 mEq/L   CO2 33 (*) 19 - 32 mEq/L   Glucose, Bld 150 (*) 70 - 99 mg/dL   BUN 58 (*) 6 - 23 mg/dL   Creatinine, Ser 2.66 (*) 0.50 - 1.35 mg/dL   Calcium 9.3  8.4 - 10.5 mg/dL   Phosphorus 1.9 (*) 2.3 - 4.6 mg/dL   Albumin 3.0 (*) 3.5 - 5.2 g/dL   GFR calc non Af Amer 24 (*) >90 mL/min   GFR calc Af Amer 28 (*) >90 mL/min   Comment:            The eGFR has been calculated     using the CKD EPI equation.     This calculation has not been     validated in all clinical     situations.     eGFR's persistently     <90 mL/min signify     possible Chronic Kidney Disease.  HEPATIC FUNCTION PANEL     Status: Abnormal   Collection  Time    04/11/13 10:16 PM      Result Value Range   Total Protein 5.5 (*) 6.0 - 8.3 g/dL   Albumin 3.0 (*) 3.5 - 5.2 g/dL   AST 37  0 - 37 U/L   ALT 68 (*) 0 - 53 U/L   Alkaline Phosphatase 113  39 - 117 U/L   Total Bilirubin 0.5  0.3 - 1.2 mg/dL   Bilirubin, Direct 0.1  0.0 - 0.3 mg/dL   Indirect Bilirubin 0.4  0.3 - 0.9 mg/dL  GLUCOSE, CAPILLARY     Status: Abnormal   Collection Time    04/11/13 11:43 PM      Result Value Range   Glucose-Capillary 104 (*) 70 - 99 mg/dL  GLUCOSE, CAPILLARY     Status: None   Collection Time    04/12/13  3:37 AM      Result Value Range   Glucose-Capillary 76  70 - 99 mg/dL  COMPREHENSIVE METABOLIC PANEL     Status: Abnormal   Collection Time    04/12/13  4:00 AM      Result Value Range   Sodium 142  135 - 145 mEq/L   Potassium 3.5  3.5 - 5.1 mEq/L   Chloride 100  96 - 112 mEq/L   CO2 30  19 - 32 mEq/L   Glucose, Bld 73  70 - 99 mg/dL   BUN 56 (*) 6 - 23 mg/dL   Creatinine, Ser 2.68 (*) 0.50 - 1.35 mg/dL   Calcium 9.6  8.4 - 10.5 mg/dL   Total Protein 5.6 (*) 6.0 - 8.3 g/dL   Albumin 3.1 (*) 3.5 - 5.2 g/dL   AST 26  0 - 37 U/L   ALT 61 (*) 0 - 53 U/L   Alkaline Phosphatase 111  39 - 117 U/L   Total Bilirubin 0.6  0.3 - 1.2 mg/dL   GFR calc non Af Amer 24 (*) >90 mL/min   GFR calc Af Amer 28 (*) >90 mL/min   Comment:            The eGFR has been calculated     using the CKD EPI equation.     This calculation has not been     validated in all clinical     situations.     eGFR's persistently     <90 mL/min signify  possible Chronic Kidney Disease.  CBC     Status: Abnormal   Collection Time    04/12/13  4:00 AM      Result Value Range   WBC 11.8 (*) 4.0 - 10.5 K/uL   RBC 4.34  4.22 - 5.81 MIL/uL   Hemoglobin 12.3 (*) 13.0 - 17.0 g/dL   HCT 39.0  39.0 - 52.0 %   MCV 89.9  78.0 - 100.0 fL   MCH 28.3  26.0 - 34.0 pg   MCHC 31.5  30.0 - 36.0 g/dL   RDW 16.4 (*) 11.5 - 15.5 %   Platelets 176  150 - 400 K/uL  APTT     Status:  None   Collection Time    04/12/13  4:00 AM      Result Value Range   aPTT 29  24 - 37 seconds  PROTIME-INR     Status: Abnormal   Collection Time    04/12/13  4:00 AM      Result Value Range   Prothrombin Time 15.3 (*) 11.6 - 15.2 seconds   INR 1.23  0.00 - 1.49  PROCALCITONIN     Status: None   Collection Time    04/12/13  4:00 AM      Result Value Range   Procalcitonin 0.37     Comment:            Interpretation:     PCT (Procalcitonin) <= 0.5 ng/mL:     Systemic infection (sepsis) is not likely.     Local bacterial infection is possible.     (NOTE)             ICU PCT Algorithm               Non ICU PCT Algorithm        ----------------------------     ------------------------------             PCT < 0.25 ng/mL                 PCT < 0.1 ng/mL         Stopping of antibiotics            Stopping of antibiotics           strongly encouraged.               strongly encouraged.        ----------------------------     ------------------------------           PCT level decrease by               PCT < 0.25 ng/mL           >= 80% from peak PCT           OR PCT 0.25 - 0.5 ng/mL          Stopping of antibiotics                                                 encouraged.         Stopping of antibiotics               encouraged.        ----------------------------     ------------------------------           PCT level decrease by  PCT >= 0.25 ng/mL           < 80% from peak PCT            AND PCT >= 0.5 ng/mL            Continuing antibiotics                                                  encouraged.           Continuing antibiotics                encouraged.        ----------------------------     ------------------------------         PCT level increase compared          PCT > 0.5 ng/mL             with peak PCT AND              PCT >= 0.5 ng/mL             Escalation of antibiotics                                              strongly encouraged.           Escalation of antibiotics            strongly encouraged.  PHOSPHORUS     Status: Abnormal   Collection Time    04/12/13  4:00 AM      Result Value Range   Phosphorus 1.9 (*) 2.3 - 4.6 mg/dL  MAGNESIUM     Status: None   Collection Time    04/12/13  4:00 AM      Result Value Range   Magnesium 1.9  1.5 - 2.5 mg/dL  TROPONIN I     Status: Abnormal   Collection Time    04/12/13  5:02 AM      Result Value Range   Troponin I 1.16 (*) <0.30 ng/mL   Comment:            Due to the release kinetics of cTnI,     a negative result within the first hours     of the onset of symptoms does not rule out     myocardial infarction with certainty.     If myocardial infarction is still suspected,     repeat the test at appropriate intervals.     CRITICAL RESULT CALLED TO, READ BACK BY AND VERIFIED WITH:     Bonnita Nasuti RN [phone] EBANKS COLCLOUGH, S  PRO B NATRIURETIC PEPTIDE     Status: Abnormal   Collection Time    04/12/13  5:02 AM      Result Value Range   Pro B Natriuretic peptide (BNP) 16846.0 (*) 0 - 125 pg/mL  BLOOD GAS, ARTERIAL     Status: Abnormal   Collection Time    04/12/13  5:14 AM      Result Value Range   FIO2 0.40     Delivery systems VENTILATOR     Mode PRESSURE REGULATED VOLUME CONTROL     VT 500     Rate 16  Peep/cpap 5.0     pH, Arterial 7.464 (*) 7.350 - 7.450   pCO2 arterial 41.6  35.0 - 45.0 mmHg   pO2, Arterial 69.4 (*) 80.0 - 100.0 mmHg   Bicarbonate 29.5 (*) 20.0 - 24.0 mEq/L   TCO2 30.7  0 - 100 mmol/L   Acid-Base Excess 5.7 (*) 0.0 - 2.0 mmol/L   O2 Saturation 94.9     Patient temperature 98.6     Collection site BRACHIAL ARTERY     Drawn by [id number]     Sample type ARTERIAL DRAW    GLUCOSE, CAPILLARY     Status: None   Collection Time    04/12/13  7:56 AM      Result Value Range   Glucose-Capillary 80  70 - 99 mg/dL  TROPONIN I     Status: Abnormal   Collection Time    04/12/13  8:05 AM      Result Value Range   Troponin I 0.86 (*) <0.30  ng/mL   Comment:            Due to the release kinetics of cTnI,     a negative result within the first hours     of the onset of symptoms does not rule out     myocardial infarction with certainty.     If myocardial infarction is still suspected,     repeat the test at appropriate intervals.     CRITICAL VALUE NOTED.  VALUE IS CONSISTENT WITH PREVIOUSLY REPORTED AND CALLED VALUE.  CK TOTAL AND CKMB     Status: None   Collection Time    04/12/13  8:05 AM      Result Value Range   Total CK 21  7 - 232 U/L   CK, MB 1.9  0.3 - 4.0 ng/mL   Relative Index RELATIVE INDEX IS INVALID  0.0 - 2.5   Comment: WHEN CK < 100 U/L             GLUCOSE, CAPILLARY     Status: None   Collection Time    04/12/13  1:11 PM      Result Value Range   Glucose-Capillary 93  70 - 99 mg/dL  TROPONIN I     Status: Abnormal   Collection Time    04/12/13  1:12 PM      Result Value Range   Troponin I 0.79 (*) <0.30 ng/mL   Comment:            Due to the release kinetics of cTnI,     a negative result within the first hours     of the onset of symptoms does not rule out     myocardial infarction with certainty.     If myocardial infarction is still suspected,     repeat the test at appropriate intervals.     CRITICAL VALUE NOTED.  VALUE IS CONSISTENT WITH PREVIOUSLY REPORTED AND CALLED VALUE.  GLUCOSE, CAPILLARY     Status: Abnormal   Collection Time    04/12/13  3:07 PM      Result Value Range   Glucose-Capillary 120 (*) 70 - 99 mg/dL      Component Value Date/Time   SDES URINE, CATHETERIZED 04/11/2013 1652   SPECREQUEST NONE 04/11/2013 1652   CULT Culture reincubated for better growth 04/11/2013 1549   REPTSTATUS 04/12/2013 FINAL 04/11/2013 1652   Dg Chest Port 1 View  04/12/2013  *RADIOLOGY REPORT*  Clinical Data: Respiratory failure.  Shortness  of breath.  PORTABLE CHEST - 1 VIEW  Comparison: Chest 04/11/2013 and 04/05/2013  Findings: Support tubes and lines are unchanged.  There has been interval  increase in bilateral pleural effusions and basilar airspace disease.  Pulmonary edema appears unchanged.  IMPRESSION: Increased bilateral pleural effusions and basilar airspace disease. No marked change in pulmonary edema.   Original Report Authenticated By: Orlean Patten, M.D.    Dg Chest Portable 1 View  04/11/2013  *RADIOLOGY REPORT*  Clinical Data: Post intubation and central line placement.  PORTABLE CHEST - 1 VIEW  Comparison: 04/11/2013  Findings: Endotracheal tube is 3.4 cm above the carina.  Right central line tip is at the cavoatrial junction.  No pneumothorax. Low lung volumes with bilateral airspace opacities, likely edema. Mild cardiomegaly.  Suspect small effusions.  NG tube is in place within the stomach.  IMPRESSION: Support devices as above.  Continued bilateral airspace opacities, likely edema.  Low volumes with bibasilar atelectasis and small effusions.   Original Report Authenticated By: Rolm Baptise, M.D.    Dg Chest Port 1 View  04/11/2013  *RADIOLOGY REPORT*  Clinical Data: Chest pain, shortness of breath.  PORTABLE CHEST - 1 VIEW  Comparison: 04/05/2013  Findings: Low lung volumes.  Cardiomegaly.  Vascular congestion and diffuse bilateral airspace opacities, likely edema.  Bibasilar atelectasis.  No visible effusions.  IMPRESSION: Low lung volumes with bibasilar atelectasis.  Mild CHF.   Original Report Authenticated By: Rolm Baptise, M.D.      Recent Results (from the past 720 hour(s))  CULTURE, BLOOD (ROUTINE X 2)     Status: None   Collection Time    04/11/13 12:30 PM      Result Value Range Status   Specimen Description BLOOD RIGHT HAND   Final   Special Requests BOTTLES DRAWN AEROBIC AND ANAEROBIC 10CC   Final   Culture  Setup Time 04/11/2013 17:11   Final   Culture     Final   Value:        BLOOD CULTURE RECEIVED NO GROWTH TO DATE CULTURE WILL BE HELD FOR 5 DAYS BEFORE ISSUING A FINAL NEGATIVE REPORT   Report Status PENDING   Incomplete  CULTURE, BLOOD (ROUTINE X  2)     Status: None   Collection Time    04/11/13 12:40 PM      Result Value Range Status   Specimen Description BLOOD RIGHT ARM   Final   Special Requests BOTTLES DRAWN AEROBIC ONLY 10CC   Final   Culture  Setup Time 04/11/2013 17:11   Final   Culture     Final   Value:        BLOOD CULTURE RECEIVED NO GROWTH TO DATE CULTURE WILL BE HELD FOR 5 DAYS BEFORE ISSUING A FINAL NEGATIVE REPORT   Report Status PENDING   Incomplete  PNEUMOCYSTIS JIROVECI SMEAR BY DFA     Status: None   Collection Time    04/11/13  3:49 PM      Result Value Range Status   Specimen Source-[facility] BRONCHIAL ALVEOLAR LAVAGE   Final   Pneumocystis jiroveci Ag NEGATIVE   Final   Comment: Performed at [place], BAL-QUANTITATIVE     Status: None   Collection Time    04/11/13  3:49 PM      Result Value Range Status   Specimen Description BRONCHIAL ALVEOLAR LAVAGE   Final   Special Requests NONE   Final   Gram Stain PENDING  Incomplete   Colony Count PENDING   Incomplete   Culture Culture reincubated for better growth   Final   Report Status PENDING   Incomplete  MRSA PCR SCREENING     Status: None   Collection Time    04/11/13  4:39 PM      Result Value Range Status   MRSA by PCR NEGATIVE  NEGATIVE Final   Comment:            The GeneXpert MRSA Assay (FDA     approved for NASAL specimens     only), is one component of a     comprehensive MRSA colonization     surveillance program. It is not     intended to diagnose MRSA     infection nor to guide or     monitor treatment for     MRSA infections.     Impression/Recommendation  62 year old man who is status post renal transplantation he was admitted to [facility] with respiratory failure after several month history of dyspnea cough and chest pain. Overlapping this same time interval he had developed an abscess in his back which was incised and drained cultures have yielded Peptostreptococcus. His renal transplantation was  performed in December of 0000000 and was complicated by 2 episodes of respiratory failure requiring mechanical intubation in February  of 2013. At that time similar to now he was treated with broad-spectrum antibiotics but no culprit pathogen was yielded and he improved.  #1 Possible HCAP in pt with renal transplantation: --agree with your having sent as min for PCP smear BAL culture, respiratory virus panel --I. will send it tracheal aspirate for fungal stain and culture along with AFB staining culture (I do NOT believe this pt is at risk for TB though he might have an non tuberculous mycobacterial infection --I will dc levaquin given negative legionella ag --I will add flagyl (for anerobe coverage esp given recent back infection --certainly this could be resp failure from NON ID cause or and ARDS from an infection at another site and we will consider need for additional  imaging  #2 Back abscess with peptostreptococcus: adding flagyl and will watch site and consider imaging      Thank you so much for this interesting consult  [facility] [place] [phone] (pager) [phone] (office) 04/12/2013, 4:08 PM  [place] 04/12/2013, 4:08 PM

## 2013-04-13 ENCOUNTER — Inpatient Hospital Stay (HOSPITAL_COMMUNITY): Payer: Medicare Other

## 2013-04-13 ENCOUNTER — Encounter (HOSPITAL_COMMUNITY): Payer: Self-pay | Admitting: General Surgery

## 2013-04-13 DIAGNOSIS — L02219 Cutaneous abscess of trunk, unspecified: Secondary | ICD-10-CM

## 2013-04-13 DIAGNOSIS — D72829 Elevated white blood cell count, unspecified: Secondary | ICD-10-CM

## 2013-04-13 DIAGNOSIS — I248 Other forms of acute ischemic heart disease: Secondary | ICD-10-CM | POA: Diagnosis present

## 2013-04-13 DIAGNOSIS — L03319 Cellulitis of trunk, unspecified: Secondary | ICD-10-CM

## 2013-04-13 DIAGNOSIS — I5033 Acute on chronic diastolic (congestive) heart failure: Secondary | ICD-10-CM | POA: Diagnosis present

## 2013-04-13 DIAGNOSIS — E876 Hypokalemia: Secondary | ICD-10-CM | POA: Diagnosis not present

## 2013-04-13 DIAGNOSIS — I5032 Chronic diastolic (congestive) heart failure: Secondary | ICD-10-CM | POA: Diagnosis present

## 2013-04-13 LAB — URINE CULTURE

## 2013-04-13 LAB — GLUCOSE, CAPILLARY
Glucose-Capillary: 147 mg/dL — ABNORMAL HIGH (ref 70–99)
Glucose-Capillary: 228 mg/dL — ABNORMAL HIGH (ref 70–99)
Glucose-Capillary: 168 mg/dL — ABNORMAL HIGH (ref 70–99)

## 2013-04-13 LAB — COMPREHENSIVE METABOLIC PANEL
Alkaline Phosphatase: 99 U/L (ref 39–117)
BUN: 63 mg/dL — ABNORMAL HIGH (ref 6–23)
CO2: 35 mEq/L — ABNORMAL HIGH (ref 19–32)
Chloride: 102 mEq/L (ref 96–112)
GFR calc Af Amer: 34 mL/min — ABNORMAL LOW (ref >90)
Glucose, Bld: 208 mg/dL — ABNORMAL HIGH (ref 70–99)
Potassium: 3.3 mEq/L — ABNORMAL LOW (ref 3.5–5.1)
Total Bilirubin: 0.6 mg/dL (ref 0.3–1.2)

## 2013-04-13 LAB — BLOOD GAS, ARTERIAL
Acid-Base Excess: 8.5 mmol/L — ABNORMAL HIGH (ref 0.0–2.0)
Drawn by: 32526
O2 Content: 10 L/min
O2 Saturation: 94.8 %
TCO2: 34.3 mmol/L (ref 0–100)
pCO2 arterial: 47.9 mmHg — ABNORMAL HIGH (ref 35.0–45.0)
pO2, Arterial: 72.5 mmHg — ABNORMAL LOW (ref 80.0–100.0)

## 2013-04-13 LAB — CBC
HCT: 38.5 % — ABNORMAL LOW (ref 39.0–52.0)
Hemoglobin: 12.2 g/dL — ABNORMAL LOW (ref 13.0–17.0)
MCV: 90.4 fL (ref 78.0–100.0)
RBC: 4.26 MIL/uL (ref 4.22–5.81)
WBC: 11 10*3/uL — ABNORMAL HIGH (ref 4.0–10.5)

## 2013-04-13 LAB — HEPARIN LEVEL (UNFRACTIONATED): Heparin Unfractionated: 0.38 IU/mL (ref 0.30–0.70)

## 2013-04-13 MED ORDER — AMIODARONE HCL IN DEXTROSE 360-4.14 MG/200ML-% IV SOLN
60.0000 mg/h | INTRAVENOUS | Status: AC
  Administered 2013-04-13 – 2013-04-14 (×2): 60 mg/h via INTRAVENOUS
  Filled 2013-04-13 (×2): qty 200

## 2013-04-13 MED ORDER — INSULIN ASPART 100 UNIT/ML ~~LOC~~ SOLN
0.0000 [IU] | Freq: Three times a day (TID) | SUBCUTANEOUS | Status: DC
  Administered 2013-04-13: 5 [IU] via SUBCUTANEOUS
  Administered 2013-04-13: 22:00:00 via SUBCUTANEOUS
  Administered 2013-04-14: 5 [IU] via SUBCUTANEOUS
  Administered 2013-04-14: 3 [IU] via SUBCUTANEOUS
  Administered 2013-04-14: 5 [IU] via SUBCUTANEOUS
  Administered 2013-04-14 – 2013-04-15 (×3): 3 [IU] via SUBCUTANEOUS
  Administered 2013-04-15: 5 [IU] via SUBCUTANEOUS
  Administered 2013-04-15 – 2013-04-16 (×2): 3 [IU] via SUBCUTANEOUS
  Administered 2013-04-16: 8 [IU] via SUBCUTANEOUS
  Administered 2013-04-17: 3 [IU] via SUBCUTANEOUS
  Administered 2013-04-17: 2 [IU] via SUBCUTANEOUS
  Administered 2013-04-17: 5 [IU] via SUBCUTANEOUS
  Administered 2013-04-17: 3 [IU] via SUBCUTANEOUS
  Administered 2013-04-18: via SUBCUTANEOUS
  Administered 2013-04-18 (×2): 5 [IU] via SUBCUTANEOUS
  Administered 2013-04-18: 2 [IU] via SUBCUTANEOUS
  Administered 2013-04-19 (×2): 8 [IU] via SUBCUTANEOUS
  Administered 2013-04-19: 3 [IU] via SUBCUTANEOUS
  Administered 2013-04-19 – 2013-04-20 (×4): 5 [IU] via SUBCUTANEOUS
  Administered 2013-04-20: 11 [IU] via SUBCUTANEOUS
  Administered 2013-04-21: 3 [IU] via SUBCUTANEOUS

## 2013-04-13 MED ORDER — PANTOPRAZOLE SODIUM 40 MG PO TBEC
40.0000 mg | DELAYED_RELEASE_TABLET | Freq: Every day | ORAL | Status: DC
  Administered 2013-04-13 – 2013-04-20 (×8): 40 mg via ORAL
  Filled 2013-04-13 (×8): qty 1

## 2013-04-13 MED ORDER — AMIODARONE HCL IN DEXTROSE 360-4.14 MG/200ML-% IV SOLN
30.0000 mg/h | INTRAVENOUS | Status: DC
  Filled 2013-04-13 (×2): qty 200

## 2013-04-13 MED ORDER — SODIUM CHLORIDE 0.9 % IV BOLUS (SEPSIS)
200.0000 mL | Freq: Once | INTRAVENOUS | Status: AC
  Administered 2013-04-13: 200 mL via INTRAVENOUS

## 2013-04-13 MED ORDER — AMIODARONE LOAD VIA INFUSION
150.0000 mg | Freq: Once | INTRAVENOUS | Status: AC
  Administered 2013-04-13: 150 mg via INTRAVENOUS
  Filled 2013-04-13: qty 83.34

## 2013-04-13 MED ORDER — POTASSIUM CHLORIDE 20 MEQ/15ML (10%) PO LIQD
40.0000 meq | Freq: Once | ORAL | Status: AC
  Administered 2013-04-13: 40 meq via ORAL
  Filled 2013-04-13: qty 30

## 2013-04-13 MED ORDER — HEPARIN SODIUM (PORCINE) 5000 UNIT/ML IJ SOLN
5000.0000 [IU] | Freq: Three times a day (TID) | INTRAMUSCULAR | Status: DC
  Administered 2013-04-13 – 2013-04-21 (×23): 5000 [IU] via SUBCUTANEOUS
  Filled 2013-04-13 (×29): qty 1

## 2013-04-13 MED ORDER — MYCOPHENOLATE MOFETIL 200 MG/ML PO SUSR: 360.0000 mg | Freq: Two times a day (BID) | ORAL | Status: DC

## 2013-04-13 MED ORDER — MYCOPHENOLATE SODIUM 180 MG PO TBEC
360.0000 mg | DELAYED_RELEASE_TABLET | Freq: Two times a day (BID) | ORAL | Status: DC
  Administered 2013-04-13 – 2013-04-21 (×16): 360 mg via ORAL
  Filled 2013-04-13 (×17): qty 2

## 2013-04-13 MED ORDER — ASPIRIN EC 325 MG PO TBEC
325.0000 mg | DELAYED_RELEASE_TABLET | Freq: Every day | ORAL | Status: DC
  Administered 2013-04-14 – 2013-04-21 (×8): 325 mg via ORAL
  Filled 2013-04-13 (×8): qty 1

## 2013-04-13 NOTE — Consult Note (Signed)
Russell Bowers 1951/03/31  [id number].   Requesting MD: Dr. Asencion Noble Chief Complaint/Reason for Consult: back abscess HPI: This is a patient known to Dr. Lilyan Punt for a sebaceous cyst of the back.  He had an I&D about a month ago in the office.  The wound is well healed.  He was admitted to the [facility] hospital 2 days ago because of hypercarbic respiratory failure and required intubation.  He was extubated today.  We have been asked to see heim for possible excision of remaining cyst contents while he is here.  The patient is not having any symptoms and the wife states that this area is healed and is no longer able to be packed.  Review of Systems: Please see HPI, otherwise negative right now  Family History  Problem Relation Age of Onset  . Diabetes Mother     Past Medical History  Diagnosis Date  . Diabetes mellitus without complication   . Cancer     Prostate cancer  . Renal failure   . Detached retina   . Hypertension   . Cellulitis and abscess of unspecified site 2014    lower back  . ADHD (attention deficit hyperactivity disorder)   . Asthma   . Cellulitis and abscess of trunk 2014    history of back abscess  . Elevated troponin   . Shortness of breath     Past Surgical History  Procedure Laterality Date  . Kidney transplant  12/01/2011  . Prostectomy  11/2010  . Retinal detachment surgery  06/2002    Social History:  reports that he quit smoking about 20 years ago. He does not have any smokeless tobacco history on file. He reports that  drinks alcohol. His drug history is not on file.  Allergies: No Known Allergies  Medications Prior to Admission  Medication Sig Dispense Refill  . allopurinol (ZYLOPRIM) 300 MG tablet Take 300 mg by mouth daily.      [name] aspirin EC 81 MG tablet Take 81 mg by mouth every other day.      . calcitRIOL (ROCALTROL) 0.25 MCG capsule Take 0.25-0.5 mcg by mouth See admin instructions. Take 1 tablet day 1, take 2 tablet day 2, take 1 tablet  day 3, and continue accordingly      . cetirizine (ZYRTEC) 10 MG tablet Take 10 mg by mouth daily.      . cinacalcet (SENSIPAR) 30 MG tablet Take 30 mg by mouth daily.      . furosemide (LASIX) 80 MG tablet Take 80 mg by mouth 4 (four) times daily.       [name] glipiZIDE (GLUCOTROL) 10 MG tablet Take 10 mg by mouth 2 (two) times daily before a meal.      . insulin glargine (LANTUS) 100 UNIT/ML injection Inject 12 Units into the skin at bedtime.      . insulin regular (NOVOLIN R,HUMULIN R) 100 units/mL injection Inject 2-8 Units into the skin 3 (three) times daily as needed. Takes as needed per sliding scale      . metoprolol (LOPRESSOR) 50 MG tablet Take 50 mg by mouth 2 (two) times daily.       . mycophenolate (MYFORTIC) 180 MG EC tablet Take 360 mg by mouth 2 (two) times daily.      [name] omeprazole (PRILOSEC) 20 MG capsule Take 20 mg by mouth 2 (two) times daily.       . pravastatin (PRAVACHOL) 80 MG tablet Take 80 mg by mouth daily.      [name]  sodium chloride (OCEAN) 0.65 % nasal spray Place 3-4 sprays into the nose at bedtime.      . tacrolimus (PROGRAF) 1 MG capsule Take 3 mg by mouth 2 (two) times daily.      . B-D ULTRAFINE III SHORT PEN 31G X 8 MM MISC         Blood pressure 118/71, pulse 122, temperature 98 F (36.7 C), temperature source Core (Comment), resp. rate 27, height 5\' 10"  (1.778 m), weight 218 lb 4.1 oz (99 kg), SpO2 100.00%. Physical Exam: General: pleasant, WD, WN white male who is laying in bed in NAD HEENT: head is normocephalic, atraumatic.  Sclera are noninjected.  PERRL.  Ears and nose without any masses or lesions.  Mouth is pink and moist Heart: regular, rhythm, tachycardic.  Normal s1,s2. No obvious murmurs, gallops, or rubs noted.  Palpable radial and pedal pulses bilaterally Lungs: bilateral crackles, no wheezes or rales noted.  Respiratory effort nonlabored Skin: warm and dry with no masses, lesions, or rashes.  He has a well healed wound on his lower back around midline.   There is a small scab noted.  No induration, erythema, or infection is noted.  This is nontender Psych: A&Ox3 with an appropriate affect.    Results for orders placed during the hospital encounter of 04/11/13 (from the past 48 hour(s))  PROCALCITONIN     Status: None   Collection Time    04/11/13  3:42 PM      Result Value Range   Procalcitonin 0.20     Comment:            Interpretation:     PCT (Procalcitonin) <= 0.5 ng/mL:     Systemic infection (sepsis) is not likely.     Local bacterial infection is possible.     (NOTE)             ICU PCT Algorithm               Non ICU PCT Algorithm        ----------------------------     ------------------------------             PCT < 0.25 ng/mL                 PCT < 0.1 ng/mL         Stopping of antibiotics            Stopping of antibiotics           strongly encouraged.               strongly encouraged.        ----------------------------     ------------------------------           PCT level decrease by               PCT < 0.25 ng/mL           >= 80% from peak PCT           OR PCT 0.25 - 0.5 ng/mL          Stopping of antibiotics                                                 encouraged.         Stopping of antibiotics  encouraged.        ----------------------------     ------------------------------           PCT level decrease by              PCT >= 0.25 ng/mL           < 80% from peak PCT            AND PCT >= 0.5 ng/mL            Continuing antibiotics                                                  encouraged.           Continuing antibiotics                encouraged.        ----------------------------     ------------------------------         PCT level increase compared          PCT > 0.5 ng/mL             with peak PCT AND              PCT >= 0.5 ng/mL             Escalation of antibiotics                                              strongly encouraged.          Escalation of antibiotics             strongly encouraged.  PNEUMOCYSTIS JIROVECI SMEAR BY DFA     Status: None   Collection Time    04/11/13  3:49 PM      Result Value Range   Specimen Source-[facility] BRONCHIAL ALVEOLAR LAVAGE     Pneumocystis jiroveci Ag NEGATIVE     Comment: Performed at [place], [facility]     Status: None   Collection Time    04/11/13  3:49 PM      Result Value Range   Specimen Description BRONCHIAL ALVEOLAR LAVAGE     Special Requests NONE     Gram Stain       Value: MODERATE WBC PRESENT,BOTH PMN AND MONONUCLEAR     RARE SQUAMOUS EPITHELIAL CELLS PRESENT     NO ORGANISMS SEEN   Colony Count 20,OOO COLONIES/ML     Culture       Value: HAEMOPHILUS PARAINFLUENZAE     Note: BETA LACTAMASE NEGATIVE   Report Status 04/13/2013 FINAL    URINALYSIS, ROUTINE W REFLEX MICROSCOPIC     Status: Abnormal   Collection Time    04/11/13  3:50 PM      Result Value Range   Color, Urine YELLOW  YELLOW   APPearance CLOUDY (*) CLEAR   Specific Gravity, Urine 1.019  1.005 - 1.030   pH 5.0  5.0 - 8.0   Glucose, UA NEGATIVE  NEGATIVE mg/dL   Hgb urine dipstick SMALL (*) NEGATIVE   Bilirubin Urine NEGATIVE  NEGATIVE   Ketones, ur NEGATIVE  NEGATIVE mg/dL   Protein, ur 100 (*) NEGATIVE mg/dL  Urobilinogen, UA 0.2  0.0 - 1.0 mg/dL   Nitrite NEGATIVE  NEGATIVE   Leukocytes, UA NEGATIVE  NEGATIVE  URINE MICROSCOPIC-ADD ON     Status: Abnormal   Collection Time    04/11/13  3:50 PM      Result Value Range   WBC, UA 0-2  <3 WBC/hpf   RBC / HPF 3-6  <3 RBC/hpf   Bacteria, UA FEW (*) RARE   Casts HYALINE CASTS (*) NEGATIVE  URINE CULTURE     Status: None   Collection Time    04/11/13  3:51 PM      Result Value Range   Specimen Description URINE, CATHETERIZED     Special Requests NONE     Culture  Setup Time 04/11/2013 21:43     Colony Count NO GROWTH     Culture NO GROWTH     Report Status 04/13/2013 FINAL    POCT I-STAT 3, BLOOD GAS (G3+)     Status: Abnormal   Collection  Time    04/11/13  4:06 PM      Result Value Range   pH, Arterial 7.311 (*) 7.350 - 7.450   pCO2 arterial 60.7 (*) 35.0 - 45.0 mmHg   pO2, Arterial 97.0  80.0 - 100.0 mmHg   Bicarbonate 30.9 (*) 20.0 - 24.0 mEq/L   TCO2 33  0 - 100 mmol/L   O2 Saturation 97.0     Acid-Base Excess 2.0  0.0 - 2.0 mmol/L   Patient temperature 97.5 F     Collection site BRACHIAL ARTERY     Drawn by RT     Sample type ARTERIAL     Comment NOTIFIED PHYSICIAN    GLUCOSE, CAPILLARY     Status: Abnormal   Collection Time    04/11/13  4:37 PM      Result Value Range   Glucose-Capillary 333 (*) 70 - 99 mg/dL  MRSA PCR SCREENING     Status: None   Collection Time    04/11/13  4:39 PM      Result Value Range   MRSA by PCR NEGATIVE  NEGATIVE   Comment:            The GeneXpert MRSA Assay (FDA     approved for NASAL specimens     only), is one component of a     comprehensive MRSA colonization     surveillance program. It is not     intended to diagnose MRSA     infection nor to guide or     monitor treatment for     MRSA infections.  RESPIRATORY VIRUS PANEL     Status: None   Collection Time    04/11/13  4:52 PM      Result Value Range   Source - [facility] NASAL WASHINGS     Comment: CORRECTED ON 04/21 AT 2314: PREVIOUSLY REPORTED AS NASAL WASHINGS   Respiratory Syncytial Virus A NOT DETECTED     Respiratory Syncytial Virus B NOT DETECTED     Influenza A NOT DETECTED     Influenza B NOT DETECTED     Parainfluenza 1 NOT DETECTED     Parainfluenza 2 NOT DETECTED     Parainfluenza 3 NOT DETECTED     Metapneumovirus NOT DETECTED     Rhinovirus NOT DETECTED     Adenovirus NOT DETECTED     Influenza A H1 NOT DETECTED     Influenza A H3 NOT DETECTED  Comment: (NOTE)           Normal Reference Range for each Analyte: NOT DETECTED     Testing performed using the Luminex xTAG Respiratory Viral Panel test     kit.     This test was developed and its performance characteristics determined     by [employer]. It has not been cleared or approved by the [place]     Food and Drug Administration. This test is used for clinical purposes.     It should not be regarded as investigational or for research. This     laboratory is certified under the [employer] (CLIA) as qualified to perform high complexity     clinical laboratory testing.  STREP PNEUMONIAE URINARY ANTIGEN     Status: None   Collection Time    04/11/13  4:52 PM      Result Value Range   Strep Pneumo Urinary Antigen NEGATIVE  NEGATIVE   Comment:            Infection due to S. pneumoniae     cannot be absolutely ruled out     since the antigen present     may be below the detection limit     of the test.  LEGIONELLA ANTIGEN, URINE     Status: None   Collection Time    04/11/13  4:52 PM      Result Value Range   Specimen Description URINE, CATHETERIZED     Special Requests NONE     Legionella Antigen, Urine Negative for Legionella pneumophilia serogroup 1     Report Status 04/12/2013 FINAL    GLUCOSE, CAPILLARY     Status: Abnormal   Collection Time    04/11/13  7:13 PM      Result Value Range   Glucose-Capillary 285 (*) 70 - 99 mg/dL   Comment 1 Notify RN    RENAL FUNCTION PANEL     Status: Abnormal   Collection Time    04/11/13 10:16 PM      Result Value Range   Sodium 139  135 - 145 mEq/L   Potassium 3.8  3.5 - 5.1 mEq/L   Chloride 98  96 - 112 mEq/L   CO2 33 (*) 19 - 32 mEq/L   Glucose, Bld 150 (*) 70 - 99 mg/dL   BUN 58 (*) 6 - 23 mg/dL   Creatinine, Ser 2.66 (*) 0.50 - 1.35 mg/dL   Calcium 9.3  8.4 - 10.5 mg/dL   Phosphorus 1.9 (*) 2.3 - 4.6 mg/dL   Albumin 3.0 (*) 3.5 - 5.2 g/dL   GFR calc non Af Amer 24 (*) >90 mL/min   GFR calc Af Amer 28 (*) >90 mL/min   Comment:            The eGFR has been calculated     using the CKD EPI equation.     This calculation has not been     validated in all clinical     situations.     eGFR's persistently     <90 mL/min  signify     possible Chronic Kidney Disease.  HEPATIC FUNCTION PANEL     Status: Abnormal   Collection Time    04/11/13 10:16 PM      Result Value Range   Total Protein 5.5 (*) 6.0 - 8.3 g/dL   Albumin 3.0 (*) 3.5 - 5.2 g/dL   AST 37  0 - 37 U/L   ALT 68 (*) 0 - 53 U/L   Alkaline Phosphatase 113  39 - 117 U/L   Total Bilirubin 0.5  0.3 - 1.2 mg/dL   Bilirubin, Direct 0.1  0.0 - 0.3 mg/dL   Indirect Bilirubin 0.4  0.3 - 0.9 mg/dL  GLUCOSE, CAPILLARY     Status: Abnormal   Collection Time    04/11/13 11:43 PM      Result Value Range   Glucose-Capillary 104 (*) 70 - 99 mg/dL  GLUCOSE, CAPILLARY     Status: None   Collection Time    04/12/13  3:37 AM      Result Value Range   Glucose-Capillary 76  70 - 99 mg/dL  COMPREHENSIVE METABOLIC PANEL     Status: Abnormal   Collection Time    04/12/13  4:00 AM      Result Value Range   Sodium 142  135 - 145 mEq/L   Potassium 3.5  3.5 - 5.1 mEq/L   Chloride 100  96 - 112 mEq/L   CO2 30  19 - 32 mEq/L   Glucose, Bld 73  70 - 99 mg/dL   BUN 56 (*) 6 - 23 mg/dL   Creatinine, Ser 2.68 (*) 0.50 - 1.35 mg/dL   Calcium 9.6  8.4 - 10.5 mg/dL   Total Protein 5.6 (*) 6.0 - 8.3 g/dL   Albumin 3.1 (*) 3.5 - 5.2 g/dL   AST 26  0 - 37 U/L   ALT 61 (*) 0 - 53 U/L   Alkaline Phosphatase 111  39 - 117 U/L   Total Bilirubin 0.6  0.3 - 1.2 mg/dL   GFR calc non Af Amer 24 (*) >90 mL/min   GFR calc Af Amer 28 (*) >90 mL/min   Comment:            The eGFR has been calculated     using the CKD EPI equation.     This calculation has not been     validated in all clinical     situations.     eGFR's persistently     <90 mL/min signify     possible Chronic Kidney Disease.  CBC     Status: Abnormal   Collection Time    04/12/13  4:00 AM      Result Value Range   WBC 11.8 (*) 4.0 - 10.5 K/uL   RBC 4.34  4.22 - 5.81 MIL/uL   Hemoglobin 12.3 (*) 13.0 - 17.0 g/dL   HCT 39.0  39.0 - 52.0 %   MCV 89.9  78.0 - 100.0 fL   MCH 28.3  26.0 - 34.0 pg   MCHC  31.5  30.0 - 36.0 g/dL   RDW 16.4 (*) 11.5 - 15.5 %   Platelets 176  150 - 400 K/uL  APTT     Status: None   Collection Time    04/12/13  4:00 AM      Result Value Range   aPTT 29  24 - 37 seconds  PROTIME-INR     Status: Abnormal   Collection Time    04/12/13  4:00 AM      Result Value Range   Prothrombin Time 15.3 (*) 11.6 - 15.2 seconds   INR 1.23  0.00 - 1.49  PROCALCITONIN     Status: None   Collection Time    04/12/13  4:00 AM      Result Value Range   Procalcitonin 0.37  Comment:            Interpretation:     PCT (Procalcitonin) <= 0.5 ng/mL:     Systemic infection (sepsis) is not likely.     Local bacterial infection is possible.     (NOTE)             ICU PCT Algorithm               Non ICU PCT Algorithm        ----------------------------     ------------------------------             PCT < 0.25 ng/mL                 PCT < 0.1 ng/mL         Stopping of antibiotics            Stopping of antibiotics           strongly encouraged.               strongly encouraged.        ----------------------------     ------------------------------           PCT level decrease by               PCT < 0.25 ng/mL           >= 80% from peak PCT           OR PCT 0.25 - 0.5 ng/mL          Stopping of antibiotics                                                 encouraged.         Stopping of antibiotics               encouraged.        ----------------------------     ------------------------------           PCT level decrease by              PCT >= 0.25 ng/mL           < 80% from peak PCT            AND PCT >= 0.5 ng/mL            Continuing antibiotics                                                  encouraged.           Continuing antibiotics                encouraged.        ----------------------------     ------------------------------         PCT level increase compared          PCT > 0.5 ng/mL             with peak PCT AND              PCT >= 0.5 ng/mL              Escalation of antibiotics  strongly encouraged.          Escalation of antibiotics            strongly encouraged.  PHOSPHORUS     Status: Abnormal   Collection Time    04/12/13  4:00 AM      Result Value Range   Phosphorus 1.9 (*) 2.3 - 4.6 mg/dL  MAGNESIUM     Status: None   Collection Time    04/12/13  4:00 AM      Result Value Range   Magnesium 1.9  1.5 - 2.5 mg/dL  TROPONIN I     Status: Abnormal   Collection Time    04/12/13  5:02 AM      Result Value Range   Troponin I 1.16 (*) <0.30 ng/mL   Comment:            Due to the release kinetics of cTnI,     a negative result within the first hours     of the onset of symptoms does not rule out     myocardial infarction with certainty.     If myocardial infarction is still suspected,     repeat the test at appropriate intervals.     CRITICAL RESULT CALLED TO, READ BACK BY AND VERIFIED WITH:     Bonnita Nasuti RN [phone] EB[address]KS [address]CLOUGH, S  PRO B NATRIURETIC PEPTIDE     Status: Abnormal   Collection Time    04/12/13  5:02 AM      Result Value Range   Pro B Natriuretic peptide (BNP) 16846.0 (*) 0 - 125 pg/mL  BLOOD GAS, ARTERIAL     Status: Abnormal   Collection Time    04/12/13  5:14 AM      Result Value Range   FIO2 0.40     Delivery systems VENTILATOR     Mode PRESSURE REGULATED VOLUME CONTROL     VT 500     Rate 16     Peep/cpap 5.0     pH, Arterial 7.464 (*) 7.350 - 7.450   pCO2 arterial 41.6  35.0 - 45.0 mmHg   pO2, Arterial 69.4 (*) 80.0 - 100.0 mmHg   Bicarbonate 29.5 (*) 20.0 - 24.0 mEq/L   TCO2 30.7  0 - 100 mmol/L   Acid-Base Excess 5.7 (*) 0.0 - 2.0 mmol/L   O2 Saturation 94.9     Patient temperature 98.6     Collection site BRACHIAL ARTERY     Drawn by [id number]     Sample type ARTERIAL DRAW    GLUCOSE, CAPILLARY     Status: None   Collection Time    04/12/13  7:56 AM      Result Value Range   Glucose-Capillary 80  70 - 99 mg/dL  TROPONIN I     Status:  Abnormal   Collection Time    04/12/13  8:05 AM      Result Value Range   Troponin I 0.86 (*) <0.30 ng/mL   Comment:            Due to the release kinetics of cTnI,     a negative result within the first hours     of the onset of symptoms does not rule out     myocardial infarction with certainty.     If myocardial infarction is still suspected,     repeat the test at appropriate intervals.     CRITICAL VALUE NOTED.  VALUE IS CONSISTENT WITH PREVIOUSLY  REPORTED AND CALLED VALUE.  CK TOTAL AND CKMB     Status: None   Collection Time    04/12/13  8:05 AM      Result Value Range   Total CK 21  7 - 232 U/L   CK, MB 1.9  0.3 - 4.0 ng/mL   Relative Index RELATIVE INDEX IS INVALID  0.0 - 2.5   Comment: WHEN CK < 100 U/L             BK VIRUS PCR     Status: None   Collection Time    04/12/13 12:30 PM      Result Value Range   BK virus DNA, quant PCR <500  <500 copies/mL   Comment: (NOTE)     The linear range of this assay is 500 to 50,000,000 copies/mL.     The performance characteristics of this assay were determined by     [employer].  This test has not been cleared or approved by     the U.S. Food and Drug Administration (FDA) for this specimen type.     The FDA has determined that such clearance or approval is not     necessary.  This test is used for clinical purposes.  It should be     regarded as investigational or for research.  This laboratory is     certified under the [employer] (CLIA-88) as qualified to perform high complexity testing.  GLUCOSE, CAPILLARY     Status: None   Collection Time    04/12/13  1:11 PM      Result Value Range   Glucose-Capillary 93  70 - 99 mg/dL  TROPONIN I     Status: Abnormal   Collection Time    04/12/13  1:12 PM      Result Value Range   Troponin I 0.79 (*) <0.30 ng/mL   Comment:            Due to the release kinetics of cTnI,     a negative result within the first hours     of  the onset of symptoms does not rule out     myocardial infarction with certainty.     If myocardial infarction is still suspected,     repeat the test at appropriate intervals.     CRITICAL VALUE NOTED.  VALUE IS CONSISTENT WITH PREVIOUSLY REPORTED AND CALLED VALUE.  GLUCOSE, CAPILLARY     Status: Abnormal   Collection Time    04/12/13  3:07 PM      Result Value Range   Glucose-Capillary 120 (*) 70 - 99 mg/dL  FUNGUS CULTURE W SMEAR     Status: None   Collection Time    04/12/13  3:50 PM      Result Value Range   Specimen Description TRACHEAL ASPIRATE     Special Requests Immunocompromised     Fungal Smear NO YEAST OR FUNGAL ELEMENTS SEEN     Culture CULTURE IN PROGRESS FOR FOUR WEEKS     Report Status PENDING    CK TOTAL AND CKMB     Status: None   Collection Time    04/12/13  4:53 PM      Result Value Range   Total CK 23  7 - 232 U/L   CK, MB 1.9  0.3 - 4.0 ng/mL   Relative Index RELATIVE INDEX IS INVALID  0.0 - 2.5   Comment:  WHEN CK < 100 U/L             GLUCOSE, CAPILLARY     Status: Abnormal   Collection Time    04/12/13  7:09 PM      Result Value Range   Glucose-Capillary 190 (*) 70 - 99 mg/dL  TROPONIN I     Status: Abnormal   Collection Time    04/12/13  9:05 PM      Result Value Range   Troponin I 0.48 (*) <0.30 ng/mL   Comment:            Due to the release kinetics of cTnI,     a negative result within the first hours     of the onset of symptoms does not rule out     myocardial infarction with certainty.     If myocardial infarction is still suspected,     repeat the test at appropriate intervals.     CRITICAL VALUE NOTED.  VALUE IS CONSISTENT WITH PREVIOUSLY REPORTED AND CALLED VALUE.  HEPARIN LEVEL (UNFRACTIONATED)     Status: Abnormal   Collection Time    04/12/13  9:05 PM      Result Value Range   Heparin Unfractionated 0.18 (*) 0.30 - 0.70 IU/mL   Comment:            IF HEPARIN RESULTS ARE BELOW     EXPECTED VALUES, AND PATIENT     DOSAGE HAS  BEEN CONFIRMED,     SUGGEST FOLLOW UP TESTING     OF ANTITHROMBIN III LEVELS.  HEMOGLOBIN A1C     Status: Abnormal   Collection Time    04/12/13 10:09 PM      Result Value Range   Hemoglobin A1C 7.0 (*) <5.7 %   Comment: (NOTE)                                                                               According to the ADA Clinical Practice Recommendations for 2011, when     HbA1c is used as a screening test:      >=6.5%   Diagnostic of Diabetes Mellitus               (if abnormal result is confirmed)     5.7-6.4%   Increased risk of developing Diabetes Mellitus     References:Diagnosis and Classification of Diabetes Mellitus,Diabetes     [id number] 1):S62-S69 and Standards of Medical Care in             Diabetes - 2011,Diabetes Care,2011,34 (Suppl 1):S11-S61.   Mean Plasma Glucose 154 (*) <117 mg/dL  CK TOTAL AND CKMB     Status: None   Collection Time    04/12/13 10:40 PM      Result Value Range   Total CK 19  7 - 232 U/L   CK, MB 1.5  0.3 - 4.0 ng/mL   Relative Index RELATIVE INDEX IS INVALID  0.0 - 2.5   Comment: WHEN CK < 100 U/L             GLUCOSE, CAPILLARY     Status: Abnormal   Collection Time    04/12/13 11:31 PM  Result Value Range   Glucose-Capillary 183 (*) 70 - 99 mg/dL  GLUCOSE, CAPILLARY     Status: Abnormal   Collection Time    04/13/13  3:36 AM      Result Value Range   Glucose-Capillary 176 (*) 70 - 99 mg/dL   Comment 1 Documented in Chart     Comment 2 Notify RN    HEPARIN LEVEL (UNFRACTIONATED)     Status: None   Collection Time    04/13/13  5:00 AM      Result Value Range   Heparin Unfractionated 0.33  0.30 - 0.70 IU/mL   Comment:            IF HEPARIN RESULTS ARE BELOW     EXPECTED VALUES, AND PATIENT     DOSAGE HAS BEEN CONFIRMED,     SUGGEST FOLLOW UP TESTING     OF ANTITHROMBIN III LEVELS.  CBC     Status: Abnormal   Collection Time    04/13/13  5:00 AM      Result Value Range   WBC 11.0 (*) 4.0 - 10.5 K/uL   RBC 4.26   4.22 - 5.81 MIL/uL   Hemoglobin 12.2 (*) 13.0 - 17.0 g/dL   HCT 38.5 (*) 39.0 - 52.0 %   MCV 90.4  78.0 - 100.0 fL   MCH 28.6  26.0 - 34.0 pg   MCHC 31.7  30.0 - 36.0 g/dL   RDW 17.3 (*) 11.5 - 15.5 %   Platelets 152  150 - 400 K/uL  PHOSPHORUS     Status: None   Collection Time    04/13/13  5:00 AM      Result Value Range   Phosphorus 2.7  2.3 - 4.6 mg/dL  COMPREHENSIVE METABOLIC PANEL     Status: Abnormal   Collection Time    04/13/13  5:00 AM      Result Value Range   Sodium 145  135 - 145 mEq/L   Potassium 3.3 (*) 3.5 - 5.1 mEq/L   Chloride 102  96 - 112 mEq/L   CO2 35 (*) 19 - 32 mEq/L   Glucose, Bld 208 (*) 70 - 99 mg/dL   BUN 63 (*) 6 - 23 mg/dL   Creatinine, Ser 2.29 (*) 0.50 - 1.35 mg/dL   Calcium 9.7  8.4 - 10.5 mg/dL   Total Protein 5.6 (*) 6.0 - 8.3 g/dL   Albumin 2.9 (*) 3.5 - 5.2 g/dL   AST 12  0 - 37 U/L   ALT 37  0 - 53 U/L   Alkaline Phosphatase 99  39 - 117 U/L   Total Bilirubin 0.6  0.3 - 1.2 mg/dL   GFR calc non Af Amer 29 (*) >90 mL/min   GFR calc Af Amer 34 (*) >90 mL/min   Comment:            The eGFR has been calculated     using the CKD EPI equation.     This calculation has not been     validated in all clinical     situations.     eGFR's persistently     <90 mL/min signify     possible Chronic Kidney Disease.  PROCALCITONIN     Status: None   Collection Time    04/13/13  5:00 AM      Result Value Range   Procalcitonin 0.25     Comment:  Interpretation:     PCT (Procalcitonin) <= 0.5 ng/mL:     Systemic infection (sepsis) is not likely.     Local bacterial infection is possible.     (NOTE)             ICU PCT Algorithm               Non ICU PCT Algorithm        ----------------------------     ------------------------------             PCT < 0.25 ng/mL                 PCT < 0.1 ng/mL         Stopping of antibiotics            Stopping of antibiotics           strongly encouraged.               strongly encouraged.         ----------------------------     ------------------------------           PCT level decrease by               PCT < 0.25 ng/mL           >= 80% from peak PCT           OR PCT 0.25 - 0.5 ng/mL          Stopping of antibiotics                                                 encouraged.         Stopping of antibiotics               encouraged.        ----------------------------     ------------------------------           PCT level decrease by              PCT >= 0.25 ng/mL           < 80% from peak PCT            AND PCT >= 0.5 ng/mL            Continuing antibiotics                                                  encouraged.           Continuing antibiotics                encouraged.        ----------------------------     ------------------------------         PCT level increase compared          PCT > 0.5 ng/mL             with peak PCT AND              PCT >= 0.5 ng/mL             Escalation of antibiotics  strongly encouraged.          Escalation of antibiotics            strongly encouraged.  GLUCOSE, CAPILLARY     Status: Abnormal   Collection Time    04/13/13  8:03 AM      Result Value Range   Glucose-Capillary 210 (*) 70 - 99 mg/dL  BLOOD GAS, ARTERIAL     Status: Abnormal   Collection Time    04/13/13  9:15 AM      Result Value Range   FIO2 35.00     O2 Content 10.0     Delivery systems TBAR FOR OXYGEN/HUMIDITY ADMINISTRATION     pH, Arterial 7.450  7.350 - 7.450   pCO2 arterial 47.9 (*) 35.0 - 45.0 mmHg   pO2, Arterial 72.5 (*) 80.0 - 100.0 mmHg   Bicarbonate 32.8 (*) 20.0 - 24.0 mEq/L   TCO2 34.3  0 - 100 mmol/L   Acid-Base Excess 8.5 (*) 0.0 - 2.0 mmol/L   O2 Saturation 94.8     Patient temperature 98.6     Collection site BRACHIAL ARTERY     Drawn by [id number]     Sample type ARTERIAL DRAW     Allens test (pass/fail) PASS  PASS  GLUCOSE, CAPILLARY     Status: Abnormal   Collection Time    04/13/13 11:27 AM       Result Value Range   Glucose-Capillary 147 (*) 70 - 99 mg/dL  HEPARIN LEVEL (UNFRACTIONATED)     Status: None   Collection Time    04/13/13 12:00 PM      Result Value Range   Heparin Unfractionated 0.38  0.30 - 0.70 IU/mL   Comment:            IF HEPARIN RESULTS ARE BELOW     EXPECTED VALUES, AND PATIENT     DOSAGE HAS BEEN CONFIRMED,     SUGGEST FOLLOW UP TESTING     OF ANTITHROMBIN III LEVELS.   Dg Chest Port 1 View  04/13/2013  *RADIOLOGY REPORT*  Clinical Data: Pleural effusions and airspace disease.  Ventilated patient.  PORTABLE CHEST - 1 VIEW  Comparison: Chest x-ray 04/12/2013.  Findings: An endotracheal tube is in place with tip 1.7 cm above the carina. There is a right-sided internal jugular central venous catheter with tip terminating in the right atrium. Nasogastric tube is seen extending into either of the antral prepyloric region of the stomach or the proximal duodenum.  Lung volumes are extremely low, and there are extensive bibasilar opacities which may reflect areas of atelectasis and/or consolidation.  Small left pleural effusion.  Pulmonary venous congestion, accentuated by low lung volumes, without frank pulmonary edema.  Cardiac silhouette is largely obscured, but appears likely to be enlarged. The patient is rotated to the right on today's exam, resulting in distortion of the mediastinal contours and reduced diagnostic sensitivity and specificity for mediastinal pathology.  IMPRESSION: 1.  Support apparatus, as above. 2.  Severely decreased lung volumes with persistent bibasilar atelectasis and/or consolidation and small left pleural effusion.   Original Report Authenticated By: Vinnie Langton, M.D.    Dg Chest Port 1 View  04/12/2013  *RADIOLOGY REPORT*  Clinical Data: Respiratory failure.  Shortness of breath.  PORTABLE CHEST - 1 VIEW  Comparison: Chest 04/11/2013 and 04/05/2013  Findings: Support tubes and lines are unchanged.  There has been interval increase in bilateral  pleural effusions and basilar airspace disease.  Pulmonary edema appears unchanged.  IMPRESSION: Increased bilateral pleural effusions and basilar airspace disease. No marked change in pulmonary edema.   Original Report Authenticated By: Orlean Patten, M.D.    Dg Chest Portable 1 View  04/11/2013  *RADIOLOGY REPORT*  Clinical Data: Post intubation and central line placement.  PORTABLE CHEST - 1 VIEW  Comparison: 04/11/2013  Findings: Endotracheal tube is 3.4 cm above the carina.  Right central line tip is at the cavoatrial junction.  No pneumothorax. Low lung volumes with bilateral airspace opacities, likely edema. Mild cardiomegaly.  Suspect small effusions.  NG tube is in place within the stomach.  IMPRESSION: Support devices as above.  Continued bilateral airspace opacities, likely edema.  Low volumes with bibasilar atelectasis and small effusions.   Original Report Authenticated By: Rolm Baptise, M.D.        Assessment/Plan 1. Back abscess, s/p I&D on 03-16-13 Patient Active Problem List  Diagnosis  . Acute respiratory failure with hypercapnia  . Pulmonary infiltrates  . RBBB  . History of renal transplantation  . Acute on chronic renal failure  . Immunocompromised  . DM2 (diabetes mellitus, type 2)  . Encephalopathy acute  . Elevated troponin  . Shortness of breath  . Acute diastolic CHF (congestive heart failure)  . Hypokalemia  . Demand ischemia of myocardium   Plan: 1. Given the patient's recent respiratory failure and intermittent unknown explanation for tachycardia, inpatient excision is not indicated or appropriate at this time.  All of these issues can be exacerbated by general anaesthesia.  There is no evidence of infection or need for urgent intervention.  He may follow up with Dr. Lilyan Punt in our office for elective resection.  Thank you for this consultation.  Call as needed.  Zetha Kuhar E 04/13/2013, 2:25 PM Pager: [phone]

## 2013-04-13 NOTE — Progress Notes (Signed)
[facility] for Infectious Disease  Day # 3 ceftazidime Day # 3 vancomycin Day # 2 flagyl  Subjective: extubated  Antibiotics:  Anti-infectives   Start     Dose/Rate Route Frequency Ordered Stop   04/13/13 1600  levofloxacin (LEVAQUIN) IVPB 750 mg  Status:  Discontinued     750 mg 100 mL/hr over 90 Minutes Intravenous Every 48 hours 04/11/13 1559 04/12/13 1342   04/12/13 1400  vancomycin (VANCOCIN) 1,250 mg in sodium chloride 0.9 % 250 mL IVPB     1,250 mg 166.7 mL/hr over 90 Minutes Intravenous Every 24 hours 04/11/13 1557     04/12/13 1400  metroNIDAZOLE (FLAGYL) IVPB 500 mg     500 mg 100 mL/hr over 60 Minutes Intravenous Every 8 hours 04/12/13 1342     04/11/13 1800  cefTAZidime (FORTAZ) 2 g in dextrose 5 % 50 mL IVPB     2 g 100 mL/hr over 30 Minutes Intravenous Every 12 hours 04/11/13 1557     04/11/13 1345  levofloxacin (LEVAQUIN) IVPB 750 mg     750 mg 100 mL/hr over 90 Minutes Intravenous  Once 04/11/13 1330 04/11/13 1725   04/11/13 1345  vancomycin (VANCOCIN) IVPB 1000 mg/200 mL premix     1,000 mg 200 mL/hr over 60 Minutes Intravenous  Once 04/11/13 1330 04/11/13 1455   04/11/13 1345  piperacillin-tazobactam (ZOSYN) IVPB 3.375 g  Status:  Discontinued     3.375 g 12.5 mL/hr over 240 Minutes Intravenous  Once 04/11/13 1330 04/11/13 2250      Medications: Scheduled Meds: . antiseptic oral rinse  15 mL Mouth Rinse QID  . aspirin  325 mg Per NG tube Daily  . cefTAZidime (FORTAZ)  IV  2 g Intravenous Q12H  . chlorhexidine  15 mL Mouth/Throat BID  . chlorhexidine  15 mL Mouth Rinse BID  . feeding supplement  60 mL Per Tube TID  . furosemide  160 mg Intravenous Q12H  . insulin aspart  0-15 Units Subcutaneous Q4H  . metoprolol tartrate  50 mg Oral BID  . metronidazole  500 mg Intravenous Q8H  . mycophenolate  360 mg Per Tube BID  . pantoprazole  40 mg Oral QHS  . sodium chloride  3 mL Intravenous Q12H  . tacrolimus  3 mg Oral BID  . vancomycin  1,250 mg  Intravenous Q24H   Continuous Infusions: . feeding supplement (OSMOLITE 1.2 CAL) 1,000 mL (04/12/13 1500)  . heparin 1,600 Units/hr (04/13/13 0700)   PRN Meds:.sodium chloride, sodium chloride, fentaNYL, midazolam, sodium chloride   Objective: Weight change: -7 lb 11.5 oz (-3.5 kg)  Intake/Output Summary (Last 24 hours) at 04/13/13 1206 Last data filed at 04/13/13 1200  Gross per 24 hour  Intake 2255.5 ml  Output   4701 ml  Net -2445.5 ml   Blood pressure 118/71, pulse 122, temperature 98 F (36.7 C), temperature source Core (Comment), resp. rate 27, height 5\' 10"  (1.778 m), weight 218 lb 4.1 oz (99 kg), SpO2 100.00%. Temp:  [95.4 F (35.2 C)-99.5 F (37.5 C)] 98 F (36.7 C) (04/22 1005) Pulse Rate:  [61-122] 122 (04/22 1005) Resp:  [15-27] 27 (04/22 1005) BP: (94-127)/(50-71) 118/71 mmHg (04/22 1000) SpO2:  [99 %-100 %] 100 % (04/22 1005) FiO2 (%):  [39.6 %-40.7 %] 40 % (04/22 0900) Weight:  [218 lb 4.1 oz (99 kg)] 218 lb 4.1 oz (99 kg) (04/22 0500)  Physical Exam: HEENT: anicteric sclera, EOMI CVS regular rate, normal r, no murmur rubs or  gallops  Chest: dec bs at bases  Abdomen: soft nontender, nondistended, normal bowel sounds,  Extremities: no clubbing or edema noted bilaterally  Skin: no rashes, there  is an area of induration and slight erythema at the prior incision and debridement performed in his lower back.  Neuro: nonfocal, strength and sensation intact  Lab Results:  Recent Labs  04/12/13 0400 04/13/13 0500  WBC 11.8* 11.0*  HGB 12.3* 12.2*  HCT 39.0 38.5*  PLT 176 152    BMET  Recent Labs  04/12/13 0400 04/13/13 0500  NA 142 145  K 3.5 3.3*  CL 100 102  CO2 30 35*  GLUCOSE 73 208*  BUN 56* 63*  CREATININE 2.68* 2.29*  CALCIUM 9.6 9.7    Micro Results: Recent Results (from the past 240 hour(s))  CULTURE, BLOOD (ROUTINE X 2)     Status: None   Collection Time    04/11/13 12:30 PM      Result Value Range Status   Specimen  Description BLOOD RIGHT HAND   Final   Special Requests BOTTLES DRAWN AEROBIC AND ANAEROBIC 10CC   Final   Culture  Setup Time 04/11/2013 17:11   Final   Culture     Final   Value:        BLOOD CULTURE RECEIVED NO GROWTH TO DATE CULTURE WILL BE HELD FOR 5 DAYS BEFORE ISSUING A FINAL NEGATIVE REPORT   Report Status PENDING   Incomplete  CULTURE, BLOOD (ROUTINE X 2)     Status: None   Collection Time    04/11/13 12:40 PM      Result Value Range Status   Specimen Description BLOOD RIGHT ARM   Final   Special Requests BOTTLES DRAWN AEROBIC ONLY 10CC   Final   Culture  Setup Time 04/11/2013 17:11   Final   Culture     Final   Value:        BLOOD CULTURE RECEIVED NO GROWTH TO DATE CULTURE WILL BE HELD FOR 5 DAYS BEFORE ISSUING A FINAL NEGATIVE REPORT   Report Status PENDING   Incomplete  PNEUMOCYSTIS JIROVECI SMEAR BY DFA     Status: None   Collection Time    04/11/13  3:49 PM      Result Value Range Status   Specimen Source-PJSRC BRONCHIAL ALVEOLAR LAVAGE   Final   Pneumocystis jiroveci Ag NEGATIVE   Final   Comment: Performed at [place], [facility]     Status: None   Collection Time    04/11/13  3:49 PM      Result Value Range Status   Specimen Description BRONCHIAL ALVEOLAR LAVAGE   Final   Special Requests NONE   Final   Gram Stain     Final   Value: MODERATE WBC PRESENT,BOTH PMN AND MONONUCLEAR     RARE SQUAMOUS EPITHELIAL CELLS PRESENT     NO ORGANISMS SEEN   Colony Count PENDING   Incomplete   Culture Culture reincubated for better growth   Final   Report Status PENDING   Incomplete  URINE CULTURE     Status: None   Collection Time    04/11/13  3:51 PM      Result Value Range Status   Specimen Description URINE, CATHETERIZED   Final   Special Requests NONE   Final   Culture  Setup Time 04/11/2013 21:43   Final   Colony Count NO GROWTH   Final   Culture NO  GROWTH   Final   Report Status 04/13/2013 FINAL   Final  MRSA PCR SCREENING      Status: None   Collection Time    04/11/13  4:39 PM      Result Value Range Status   MRSA by PCR NEGATIVE  NEGATIVE Final   Comment:            The GeneXpert MRSA Assay (FDA     approved for NASAL specimens     only), is one component of a     comprehensive MRSA colonization     surveillance program. It is not     intended to diagnose MRSA     infection nor to guide or     monitor treatment for     MRSA infections.  RESPIRATORY VIRUS PANEL     Status: None   Collection Time    04/11/13  4:52 PM      Result Value Range Status   Source - RVPAN NASAL WASHINGS   Corrected   Comment: CORRECTED ON 04/21 AT 2314: PREVIOUSLY REPORTED AS NASAL WASHINGS   Respiratory Syncytial Virus A NOT DETECTED   Final   Respiratory Syncytial Virus B NOT DETECTED   Final   Influenza A NOT DETECTED   Final   Influenza B NOT DETECTED   Final   Parainfluenza 1 NOT DETECTED   Final   Parainfluenza 2 NOT DETECTED   Final   Parainfluenza 3 NOT DETECTED   Final   Metapneumovirus NOT DETECTED   Final   Rhinovirus NOT DETECTED   Final   Adenovirus NOT DETECTED   Final   Influenza A H1 NOT DETECTED   Final   Influenza A H3 NOT DETECTED   Final   Comment: (NOTE)           Normal Reference Range for each Analyte: NOT DETECTED     Testing performed using the Luminex xTAG Respiratory Viral Panel test     kit.     This test was developed and its performance characteristics determined     by [employer]. It has not been cleared or approved by the [place]     Food and Drug Administration. This test is used for clinical purposes.     It should not be regarded as investigational or for research. This     laboratory is certified under the [place] (CLIA) as qualified to perform high complexity     clinical laboratory testing.  FUNGUS CULTURE W SMEAR     Status: None   Collection Time    04/12/13  3:50 PM      Result Value Range Status   Specimen Description  TRACHEAL ASPIRATE   Final   Special Requests Immunocompromised   Final   Fungal Smear NO YEAST OR FUNGAL ELEMENTS SEEN   Final   Culture CULTURE IN PROGRESS FOR FOUR WEEKS   Final   Report Status PENDING   Incomplete    Studies/Results: Dg Chest Port 1 View  04/13/2013  *RADIOLOGY REPORT*  Clinical Data: Pleural effusions and airspace disease.  Ventilated patient.  PORTABLE CHEST - 1 VIEW  Comparison: Chest x-ray 04/12/2013.  Findings: An endotracheal tube is in place with tip 1.7 cm above the carina. There is a right-sided internal jugular central venous catheter with tip terminating in the right atrium. Nasogastric tube is seen extending into either of the antral prepyloric region of the stomach or  the proximal duodenum.  Lung volumes are extremely low, and there are extensive bibasilar opacities which may reflect areas of atelectasis and/or consolidation.  Small left pleural effusion.  Pulmonary venous congestion, accentuated by low lung volumes, without frank pulmonary edema.  Cardiac silhouette is largely obscured, but appears likely to be enlarged. The patient is rotated to the right on today's exam, resulting in distortion of the mediastinal contours and reduced diagnostic sensitivity and specificity for mediastinal pathology.  IMPRESSION: 1.  Support apparatus, as above. 2.  Severely decreased lung volumes with persistent bibasilar atelectasis and/or consolidation and small left pleural effusion.   Original Report Authenticated By: Vinnie Langton, M.D.    Dg Chest Port 1 View  04/12/2013  *RADIOLOGY REPORT*  Clinical Data: Respiratory failure.  Shortness of breath.  PORTABLE CHEST - 1 VIEW  Comparison: Chest 04/11/2013 and 04/05/2013  Findings: Support tubes and lines are unchanged.  There has been interval increase in bilateral pleural effusions and basilar airspace disease.  Pulmonary edema appears unchanged.  IMPRESSION: Increased bilateral pleural effusions and basilar airspace disease. No  marked change in pulmonary edema.   Original Report Authenticated By: Orlean Patten, M.D.    Dg Chest Portable 1 View  04/11/2013  *RADIOLOGY REPORT*  Clinical Data: Post intubation and central line placement.  PORTABLE CHEST - 1 VIEW  Comparison: 04/11/2013  Findings: Endotracheal tube is 3.4 cm above the carina.  Right central line tip is at the cavoatrial junction.  No pneumothorax. Low lung volumes with bilateral airspace opacities, likely edema. Mild cardiomegaly.  Suspect small effusions.  NG tube is in place within the stomach.  IMPRESSION: Support devices as above.  Continued bilateral airspace opacities, likely edema.  Low volumes with bibasilar atelectasis and small effusions.   Original Report Authenticated By: Rolm Baptise, M.D.       Assessment/Plan: LEDION PIA is a 62 y.o. male with  62 year old man who is status post renal transplantation he was [facility] with respiratory failure after several month history of dyspnea cough and chest pain. Overlapping this same time interval he had developed an abscess in his back which was incised and drained cultures have yielded Peptostreptococcus. His renal transplantation was performed in December of 0000000 and was complicated by 2 episodes of respiratory failure requiring mechanical intubation in February of 2013. At that time similar to now he was treated with broad-spectrum antibiotics but no culprit pathogen was yielded and he improved. Today he was extubated. Cultures blood urine and BAL are negative for bacterial pathogens. ECP smear is negative Legionella and pneumococcal antigen from urine are negative. No fungi on fungal smear, cx cooking AFB taken no results yet  #1 Possible HCAP in pt with renal transplantation:  --continue vancomycin, ceftazidime, flagyl for now  #2 Back abscess with peptostreptococcus: per pts wife CCS had planned on further debridement  --would ask CCS to see him in house and would recommend  erring on side of aggressive therapy in this immunocompromise patient   I spent greater than 45 minutes with the patient including greater than 50% of time in face to face counsel of the patient and in coordination of their care.    LOS: 2 days   Alcide Evener 04/13/2013, 12:06 PM

## 2013-04-13 NOTE — Progress Notes (Signed)
[facility] ADULT ICU REPLACEMENT PROTOCOL FOR AM LAB REPLACEMENT ONLY  The patient does not apply for the [facility] Adult ICU Electrolyte Replacment Protocol based on the criteria listed below:   1. Is GFR >/= 40 ml/min? no  Patient's GFR today is 29 2. Is urine output >/= 0.5 ml/kg/hr for the last 6 hours? no Patient's UOP is  ml/kg/hr 3. Is BUN < 60 mg/dL? no  Patient's BUN today is 63 4. Abnormal electrolyte(s): K 3.3 5. Ordered repletion with:  6. If a panic level lab has been reported, has the CCM MD in charge been notified? yes.   Physician:  Dr Jimmy Footman  Orvil Feil, Jozlynn Plaia A 04/13/2013 6:14 AM

## 2013-04-13 NOTE — Progress Notes (Signed)
SUBJECTIVE:  Breathing much better today  OBJECTIVE:   Vitals:   Filed Vitals:   04/13/13 0900 04/13/13 0953 04/13/13 1000 04/13/13 1005  BP: 124/69 124/69 118/71   Pulse: 95 112 121 122  Temp: 97.9 F (36.6 C)  97.7 F (36.5 C) 98 F (36.7 C)  TempSrc:      Resp: 24  25 27   Height:      Weight:      SpO2: 100%  100% 100%   I&O's:   Intake/Output Summary (Last 24 hours) at 04/13/13 1117 Last data filed at 04/13/13 1000  Gross per 24 hour  Intake 2359.5 ml  Output   4725 ml  Net -2365.5 ml   TELEMETRY: Reviewed telemetry pt in NSR:     PHYSICAL EXAM General: Well developed, well nourished, in no acute distress Head: Eyes PERRLA, No xanthomas.   Normal cephalic and atramatic  Lungs:   Crackles at bases R>L Heart:   HRRR S1 S2 Pulses are 2+ & equal. Abdomen: Bowel sounds are positive, abdomen soft and non-tender without masses  Extremities:   No clubbing, cyanosis or edema.  DP +1 Neuro: Alert and oriented X 3. Psych:  Good affect, responds appropriately   LABS: Basic Metabolic Panel:  Recent Labs  04/11/13 2216 04/12/13 0400 04/13/13 0500  NA 139 142 145  K 3.8 3.5 3.3*  CL 98 100 102  CO2 33* 30 35*  GLUCOSE 150* 73 208*  BUN 58* 56* 63*  CREATININE 2.66* 2.68* 2.29*  CALCIUM 9.3 9.6 9.7  MG  --  1.9  --   PHOS 1.9* 1.9* 2.7   Liver Function Tests:  Recent Labs  04/12/13 0400 04/13/13 0500  AST 26 12  ALT 61* 37  ALKPHOS 111 99  BILITOT 0.6 0.6  PROT 5.6* 5.6*  ALBUMIN 3.1* 2.9*   No results found for this basename: LIPASE, AMYLASE,  in the last 72 hours CBC:  Recent Labs  04/11/13 1155 04/12/13 0400 04/13/13 0500  WBC 12.6* 11.8* 11.0*  NEUTROABS 11.7*  --   --   HGB 14.5 12.3* 12.2*  HCT 47.8 39.0 38.5*  MCV 95.6 89.9 90.4  PLT 218 176 152   Cardiac Enzymes:  Recent Labs  04/12/13 0805 04/12/13 1312 04/12/13 1653 04/12/13 2105 04/12/13 2240  CKTOTAL 21  --  23  --  19  CKMB 1.9  --  1.9  --  1.5  TROPONINI 0.86*  0.79*  --  0.48*  --    BNP: No components found with this basename: POCBNP,  D-Dimer: No results found for this basename: DDIMER,  in the last 72 hours Hemoglobin A1C: No results found for this basename: HGBA1C,  in the last 72 hours Fasting Lipid Panel: No results found for this basename: CHOL, HDL, LDLCALC, TRIG, CHOLHDL, LDLDIRECT,  in the last 72 hours Thyroid Function Tests: No results found for this basename: TSH, T4TOTAL, FREET3, T3FREE, THYROIDAB,  in the last 72 hours Anemia Panel: No results found for this basename: VITAMINB12, FOLATE, FERRITIN, TIBC, IRON, RETICCTPCT,  in the last 72 hours Coag Panel:   Lab Results  Component Value Date   INR 1.23 04/12/2013   INR 1.16 04/11/2013   INR 1.01 03/11/2013    RADIOLOGY: Dg Chest 2 View  04/05/2013  *RADIOLOGY REPORT*  Clinical Data: Chest pain and cough.  History of renal transplant.  CHEST - 2 VIEW  Comparison: 10/01/2011.  Findings: Trachea is midline.  Heart size is grossly stable.  Lungs  are low in volume with central pulmonary vascular congestion and scattered linear densities at the lung bases.  No pleural fluid.  IMPRESSION: Very low lung volumes with central pulmonary vascular congestion and bibasilar subsegmental atelectasis.   Original Report Authenticated By: Lorin Picket, M.D.    Dg Chest Port 1 View  04/13/2013  *RADIOLOGY REPORT*  Clinical Data: Pleural effusions and airspace disease.  Ventilated patient.  PORTABLE CHEST - 1 VIEW  Comparison: Chest x-ray 04/12/2013.  Findings: An endotracheal tube is in place with tip 1.7 cm above the carina. There is a right-sided internal jugular central venous catheter with tip terminating in the right atrium. Nasogastric tube is seen extending into either of the antral prepyloric region of the stomach or the proximal duodenum.  Lung volumes are extremely low, and there are extensive bibasilar opacities which may reflect areas of atelectasis and/or consolidation.  Small left pleural  effusion.  Pulmonary venous congestion, accentuated by low lung volumes, without frank pulmonary edema.  Cardiac silhouette is largely obscured, but appears likely to be enlarged. The patient is rotated to the right on today's exam, resulting in distortion of the mediastinal contours and reduced diagnostic sensitivity and specificity for mediastinal pathology.  IMPRESSION: 1.  Support apparatus, as above. 2.  Severely decreased lung volumes with persistent bibasilar atelectasis and/or consolidation and small left pleural effusion.   Original Report Authenticated By: Vinnie Langton, M.D.    Dg Chest Port 1 View  04/12/2013  *RADIOLOGY REPORT*  Clinical Data: Respiratory failure.  Shortness of breath.  PORTABLE CHEST - 1 VIEW  Comparison: Chest 04/11/2013 and 04/05/2013  Findings: Support tubes and lines are unchanged.  There has been interval increase in bilateral pleural effusions and basilar airspace disease.  Pulmonary edema appears unchanged.  IMPRESSION: Increased bilateral pleural effusions and basilar airspace disease. No marked change in pulmonary edema.   Original Report Authenticated By: Orlean Patten, M.D.    Dg Chest Portable 1 View  04/11/2013  *RADIOLOGY REPORT*  Clinical Data: Post intubation and central line placement.  PORTABLE CHEST - 1 VIEW  Comparison: 04/11/2013  Findings: Endotracheal tube is 3.4 cm above the carina.  Right central line tip is at the cavoatrial junction.  No pneumothorax. Low lung volumes with bilateral airspace opacities, likely edema. Mild cardiomegaly.  Suspect small effusions.  NG tube is in place within the stomach.  IMPRESSION: Support devices as above.  Continued bilateral airspace opacities, likely edema.  Low volumes with bibasilar atelectasis and small effusions.   Original Report Authenticated By: Rolm Baptise, M.D.    Dg Chest Port 1 View  04/11/2013  *RADIOLOGY REPORT*  Clinical Data: Chest pain, shortness of breath.  PORTABLE CHEST - 1 VIEW  Comparison:  04/05/2013  Findings: Low lung volumes.  Cardiomegaly.  Vascular congestion and diffuse bilateral airspace opacities, likely edema.  Bibasilar atelectasis.  No visible effusions.  IMPRESSION: Low lung volumes with bibasilar atelectasis.  Mild CHF.   Original Report Authenticated By: Rolm Baptise, M.D.       ASSESSMENT:  1.  Acute respiratory failure 2.  NSTEMI  Most likely demand ischemia for respiratory illness - CPK and MB normal 3.  Chronic renal insufficiency with history of renal transplant  4.  Acute diastolic CHF - EF on echo 60-65% 5.  Mild AS 6.  Hypokalemia  PLAN:   1.  Continue ASA/beta blocker 2.  OK to d/c IV Heparin  3.  Continue IV Lasix 4.  Replete potassium - per primary service  TURNER,TRACI  R, MD  04/13/2013  11:17 AM

## 2013-04-13 NOTE — Progress Notes (Signed)
ANTICOAGULATION CONSULT NOTE - Follow up [facility] for heparin Indication: chest pain/ACS  No Known Allergies  Patient Measurements: Height: 5\' 10"  (177.8 cm) Weight: 225 lb 15.5 oz (102.5 kg) IBW/kg (Calculated) : 73 Heparin Dosing Weight: 94kg   Vital Signs: Temp: 97.5 F (36.4 C) (04/22 0300) Temp src: Core (Comment) (04/21 2000) BP: 100/50 mmHg (04/22 0306) Pulse Rate: 79 (04/22 0306)  Labs:  Recent Labs  04/11/13 1155 04/11/13 2216 04/12/13 0400  04/12/13 0805 04/12/13 1312 04/12/13 1653 04/12/13 2105 04/12/13 2240 04/13/13 0500  HGB 14.5  --  12.3*  --   --   --   --   --   --  12.2*  HCT 47.8  --  39.0  --   --   --   --   --   --  38.5*  PLT 218  --  176  --   --   --   --   --   --  152  APTT 29  --  29  --   --   --   --   --   --   --   LABPROT 14.6  --  15.3*  --   --   --   --   --   --   --   INR 1.16  --  1.23  --   --   --   --   --   --   --   HEPARINUNFRC  --   --   --   --   --   --   --  0.18*  --  0.33  CREATININE 2.72* 2.66* 2.68*  --   --   --   --   --   --   --   CKTOTAL  --   --   --   --  21  --  23  --  19  --   CKMB  --   --   --   --  1.9  --  1.9  --  1.5  --   TROPONINI <0.30  --   --   < > 0.86* 0.79*  --  0.48*  --   --   < > = values in this interval not displayed.  Estimated Creatinine Clearance: 34.7 ml/min (by C-G formula based on Cr of 2.68).   Medical History: Past Medical History  Diagnosis Date  . Diabetes mellitus without complication   . Cancer     Prostate cancer  . Renal failure   . Detached retina   . Hypertension   . Cellulitis and abscess of unspecified site 2014    lower back  . ADHD (attention deficit hyperactivity disorder)   . Asthma   . Cellulitis and abscess of trunk 2014    history of back abscess  . Elevated troponin   . Shortness of breath     Medications:  Scheduled:  . antiseptic oral rinse  15 mL Mouth Rinse QID  . aspirin  325 mg Per NG tube Daily  . cefTAZidime  (FORTAZ)  IV  2 g Intravenous Q12H  . chlorhexidine  15 mL Mouth/Throat BID  . chlorhexidine  15 mL Mouth Rinse BID  . feeding supplement  60 mL Per Tube TID  . furosemide  160 mg Intravenous Q12H  . [COMPLETED] furosemide  40 mg Intravenous Once  . insulin aspart  0-15 Units Subcutaneous Q4H  . metoprolol tartrate  50  mg Oral BID  . metronidazole  500 mg Intravenous Q8H  . mycophenolate  360 mg Per Tube BID  . pantoprazole sodium  40 mg Per Tube Q1200  . sodium chloride  3 mL Intravenous Q12H  . tacrolimus  3 mg Oral BID  . vancomycin  1,250 mg Intravenous Q24H  . [DISCONTINUED] antiseptic oral rinse  15 mL Mouth Rinse QID  . [DISCONTINUED] aspirin EC  325 mg Oral Daily  . [DISCONTINUED] heparin  5,000 Units Subcutaneous Q8H  . [DISCONTINUED] levofloxacin (LEVAQUIN) IV  750 mg Intravenous Q48H  . [DISCONTINUED] metoprolol tartrate  50 mg Oral BID  . [DISCONTINUED] metoprolol tartrate  25 mg Oral BID  . [DISCONTINUED] pantoprazole  40 mg Oral Q1200  . [DISCONTINUED] pantoprazole (PROTONIX) IV  40 mg Intravenous Q24H    Assessment: 62 yo male with elevated troponin and concern for ACS receiving heparin for anticoagulation. Heparin level is therapeutic.  Goal of Therapy:  Heparin level 0.3-0.7 units/ml Monitor platelets by anticoagulation protocol: Yes   Plan:  Cont heparin drip at 1700 units/hr. Recheck in 6 hours to confirm.  Excell Seltzer, PharmD, Pager [phone]   04/13/2013 5:40 AM

## 2013-04-13 NOTE — Progress Notes (Signed)
Patient HR 130s with BP 81/45. Patient is asymptomatic. Notified MD Turner. 200cc NS bolus given and BP reassessed BP 103/70. MD turner ordered amiodarone with a 150 load infusion and lasix dose on hold for tonight dose. Call for SBP <85. Will continue to closely monitor.   Lamar Sprinkles

## 2013-04-13 NOTE — Progress Notes (Signed)
Subjective: Interval History: has complaints wants tube out.  Objective: Vital signs in last 24 hours: Temp:  [95.4 F (35.2 C)-99.7 F (37.6 C)] 95.4 F (35.2 C) (04/22 0718) Pulse Rate:  [61-129] 82 (04/22 0718) Resp:  [15-26] 16 (04/22 0718) BP: (94-127)/(50-70) 114/62 mmHg (04/22 0718) SpO2:  [99 %-100 %] 99 % (04/22 0718) FiO2 (%):  [39.6 %-40.9 %] 40.3 % (04/22 0718) Weight:  [99 kg (218 lb 4.1 oz)] 99 kg (218 lb 4.1 oz) (04/22 0500) Weight change: -3.5 kg (-7 lb 11.5 oz)  Intake/Output from previous day: 04/21 0701 - 04/22 0700 In: 2265.5 [I.V.:493.5; NG/GT:940; IV Piggyback:832] Out: [id number] [Urine:4775] Intake/Output this shift:    General appearance: alert, cooperative and moderately obese Resp: rhonchi bibasilar Cardio: S1, S2 normal and systolic murmur: holosystolic 2/6, blowing at apex GI: obese,pos bs, liver down 4 cm Extremities: avf R FA TX RLQ Normal  Lab Results:  Recent Labs  04/12/13 0400 04/13/13 0500  WBC 11.8* 11.0*  HGB 12.3* 12.2*  HCT 39.0 38.5*  PLT 176 152   BMET:  Recent Labs  04/12/13 0400 04/13/13 0500  NA 142 145  K 3.5 3.3*  CL 100 102  CO2 30 35*  GLUCOSE 73 208*  BUN 56* 63*  CREATININE 2.68* 2.29*  CALCIUM 9.6 9.7   No results found for this basename: PTH,  in the last 72 hours Iron Studies: No results found for this basename: IRON, TIBC, TRANSFERRIN, FERRITIN,  in the last 72 hours  Studies/Results: Dg Chest Port 1 View  04/12/2013  *RADIOLOGY REPORT*  Clinical Data: Respiratory failure.  Shortness of breath.  PORTABLE CHEST - 1 VIEW  Comparison: Chest 04/11/2013 and 04/05/2013  Findings: Support tubes and lines are unchanged.  There has been interval increase in bilateral pleural effusions and basilar airspace disease.  Pulmonary edema appears unchanged.  IMPRESSION: Increased bilateral pleural effusions and basilar airspace disease. No marked change in pulmonary edema.   Original Report Authenticated By: Orlean Patten, M.D.    Dg Chest Portable 1 View  04/11/2013  *RADIOLOGY REPORT*  Clinical Data: Post intubation and central line placement.  PORTABLE CHEST - 1 VIEW  Comparison: 04/11/2013  Findings: Endotracheal tube is 3.4 cm above the carina.  Right central line tip is at the cavoatrial junction.  No pneumothorax. Low lung volumes with bilateral airspace opacities, likely edema. Mild cardiomegaly.  Suspect small effusions.  NG tube is in place within the stomach.  IMPRESSION: Support devices as above.  Continued bilateral airspace opacities, likely edema.  Low volumes with bibasilar atelectasis and small effusions.   Original Report Authenticated By: Rolm Baptise, M.D.    Dg Chest Port 1 View  04/11/2013  *RADIOLOGY REPORT*  Clinical Data: Chest pain, shortness of breath.  PORTABLE CHEST - 1 VIEW  Comparison: 04/05/2013  Findings: Low lung volumes.  Cardiomegaly.  Vascular congestion and diffuse bilateral airspace opacities, likely edema.  Bibasilar atelectasis.  No visible effusions.  IMPRESSION: Low lung volumes with bibasilar atelectasis.  Mild CHF.   Original Report Authenticated By: Rolm Baptise, M.D.     I have reviewed the patient's current medications.  Assessment/Plan: 1 Renal Tx Cr at baseline.  Vol xs improving, cont diuretics. 2 Resp suspect non infectious.  C&S P 3 Dm controlled 4 Obesity 5 SVT/^Trop  Suspect CAD but could be demand ischemia with arrhythmias. P Lasix, k, C&S  ??extubate, Will need Cath    LOS: 2 days   Russell Bowers 04/13/2013,7:32 AM

## 2013-04-13 NOTE — Procedures (Signed)
Extubation Procedure Note  Patient Details:   Name: Russell Bowers DOB: May 12, 1951 MRN: [id number]   Airway Documentation:   Patient weaned from ventilatory support with Tpc setup and aerosol with O2 at 35%. Tol well. ABG: 7.45,48,73. Dr Joya Gaskins evaluated patient and ordered extubation. Pt now wearing nasal cannula with oxygen running at 4lpm.  Evaluation  O2 sats: stable throughout Complications: No apparent complications Patient did tolerate procedure well. Bilateral Breath Sounds: Diminished Suctioning: Airway Yes  Malachi Paradise 04/13/2013, 10:03 AM

## 2013-04-13 NOTE — Progress Notes (Signed)
  Amiodarone Drug - Drug Interaction Consult Note  Recommendations: Monitor for hypokalemia and bradycardia. Amiodarone is metabolized by the cytochrome P450 system and therefore has the potential to cause many drug interactions. Amiodarone has an average plasma half-life of 50 days (range 20 to 100 days).   There is potential for drug interactions to occur several weeks or months after stopping treatment and the onset of drug interactions may be slow after initiating amiodarone.   []  Statins: Increased risk of myopathy. Simvastatin- restrict dose to 20mg  daily. Other statins: counsel patients to report any muscle pain or weakness immediately.  []  Anticoagulants: Amiodarone can increase anticoagulant effect. Consider warfarin dose reduction. Patients should be monitored closely and the dose of anticoagulant altered accordingly, remembering that amiodarone levels take several weeks to stabilize.  []  Antiepileptics: Amiodarone can increase plasma concentration of phenytoin, the dose should be reduced. Note that small changes in phenytoin dose can result in large changes in levels. Monitor patient and counsel on signs of toxicity.  [x]  Beta blockers: increased risk of bradycardia, AV block and myocardial depression. Sotalol - avoid concomitant use.  []   Calcium channel blockers (diltiazem and verapamil): increased risk of bradycardia, AV block and myocardial depression.  []   Cyclosporine: Amiodarone increases levels of cyclosporine. Reduced dose of cyclosporine is recommended.  []  Digoxin dose should be halved when amiodarone is started.  [x]  Diuretics: increased risk of cardiotoxicity if hypokalemia occurs.  []  Oral hypoglycemic agents (glyburide, glipizide, glimepiride): increased risk of hypoglycemia. Patient's glucose levels should be monitored closely when initiating amiodarone therapy.   []  Drugs that prolong the QT interval:  Torsades de pointes risk may be increased with concurrent  use - avoid if possible.  Monitor QTc, also keep magnesium/potassium WNL if concurrent therapy can't be avoided. [name] Antibiotics: e.g. fluoroquinolones, erythromycin. . Antiarrhythmics: e.g. quinidine, procainamide, disopyramide, sotalol. . Antipsychotics: e.g. phenothiazines, haloperidol.  . Lithium, tricyclic antidepressants, and methadone. Thank You,  Excell Seltzer [place]  04/13/2013 10:40 PM

## 2013-04-13 NOTE — Consult Note (Signed)
Not the best time to consider elective surgery on his back.  Kathryne Eriksson. Dahlia Bailiff, MD, [place] [phone] [facility]

## 2013-04-13 NOTE — Progress Notes (Addendum)
PULMONARY  / CRITICAL CARE MEDICINE  Name: Russell Bowers MRN: [id number] DOB: 11/01/1951    ADMISSION DATE:  04/11/2013   BRIEF PATIENT DESCRIPTION:  67 male with hx of renal transplant 11/2011, intubated by PCCM in ED for acute on chronic hypercarbic respiratory failure after 1-2 months of cough, atypical chest pain and marked increase in DOE over day or two PTA. Bilateral pulmonary infiltrates on CXR. Very similar admission in Feb 2013 @ [place].   SIGNIFICANT EVENTS / STUDIES:  4/20 admitted with hypercarbic respiratory failure, pulmonary infiltrates, acute on chronic renal insuff, RBBB 4/22 extubated  LINES / TUBES: ETT 4/20 >> 4/22 R IJ CVL 4/20 >>   CULTURES: MRSA PCR 4/20 >> NEG Strep Ag 4/20 >> NEG Legionella Ag 4/20 >>Negative  Streptococcal Urine Ag 4/20 >> negative   Urine 4/20 >> No growth Resp virus panel 4/20 >> negative  PJP DFA 4/20 >> negative  BAL cx 4/20 >> Pending Fungal respiratory cx 4/21 >> AFB resp. cx 4/21 >> Blood 4/20 >>    ANTIBIOTICS: ABX per ID  Vanc 4/20 >>  Cefaz 4/20 >>  Levoflox 4/20 >> 4/21  Flagyl 4/21>>   SUBJECTIVE:  No overnight events. No pain. Fully alert and interactive. Passed SBT and successfully extubated this AM  VITAL SIGNS: Temp:  [95.4 F (35.2 C)-99.7 F (37.6 C)] 97.3 F (36.3 C) (04/22 0700) Pulse Rate:  [61-129] 88 (04/22 0700) Resp:  [15-26] 17 (04/22 0700) BP: (94-127)/(50-70) 114/62 mmHg (04/22 0700) SpO2:  [99 %-100 %] 100 % (04/22 0700) FiO2 (%):  [39.6 %-40.9 %] 40.3 % (04/22 0700) Weight:  [218 lb 4.1 oz (99 kg)] 218 lb 4.1 oz (99 kg) (04/22 0500) HEMODYNAMICS: CVP:  [5 mmHg-9 mmHg] 6 mmHg VENTILATOR SETTINGS: Vent Mode:  [-] PRVC FiO2 (%):  [39.6 %-40.9 %] 40.3 % Set Rate:  [16 bmp] 16 bmp Vt Set:  [500 mL] 500 mL PEEP:  [5 cmH20] 5 cmH20 Pressure Support:  [5 cmH20] 5 cmH20 Plateau Pressure:  [18 cmH20-20 cmH20] 18 cmH20 INTAKE / OUTPUT: Intake/Output     04/21 0701 - 04/22 0700 04/22 0701  - 04/23 0700   I.V. (mL/kg) 493.5 (5)    NG/GT 940    IV Piggyback 832    Total Intake(mL/kg) 2265.5 (22.9)    Urine (mL/kg/hr) 4775 (2)    Total Output 4775     Net -2509.5            PHYSICAL EXAMINATION: General:  Appears comfortable. Fully alert and in no distress Neuro:  RASS 0, interactive  HEENT:  [place]/AT, PERRL, EOMI Cardiovascular:  RRR, no murmurs  Lungs:  Scattered rhonchi anteriorly, bibasilar bronchial BS posteriorly Abdomen:  Mod obese, soft, NT, NABS Back: Abscess site look clean with a small area of induration with erythema.  EXT: warm, no edema Skin:  No other lesions  LABS:  Recent Labs Lab 04/11/13 1155 04/11/13 1156 04/11/13 1204 04/11/13 1218 04/11/13 1542 04/11/13 1606 04/11/13 2216 04/12/13 0400 04/12/13 0502 04/12/13 0514 04/12/13 0805 04/12/13 1312 04/12/13 2105 04/13/13 0500  HGB 14.5  --   --   --   --   --   --  12.3*  --   --   --   --   --  12.2*  WBC 12.6*  --   --   --   --   --   --  11.8*  --   --   --   --   --  11.0*  PLT 218  --   --   --   --   --   --  176  --   --   --   --   --  152  NA 137  --   --   --   --   --  139 142  --   --   --   --   --  145  K 5.0  --   --   --   --   --  3.8 3.5  --   --   --   --   --  3.3*  CL 95*  --   --   --   --   --  98 100  --   --   --   --   --  102  CO2 33*  --   --   --   --   --  33* 30  --   --   --   --   --  35*  GLUCOSE 381*  --   --   --   --   --  150* 73  --   --   --   --   --  208*  BUN 56*  --   --   --   --   --  58* 56*  --   --   --   --   --  63*  CREATININE 2.72*  --   --   --   --   --  2.66* 2.68*  --   --   --   --   --  2.29*  CALCIUM 9.6  --   --   --   --   --  9.3 9.6  --   --   --   --   --  9.7  MG  --   --   --   --   --   --   --  1.9  --   --   --   --   --   --   PHOS  --   --   --   --   --   --  1.9* 1.9*  --   --   --   --   --  2.7  AST  --   --   --   --   --   --  37 26  --   --   --   --   --  12  ALT  --   --   --   --   --   --  68* 61*  --   --    --   --   --  37  ALKPHOS  --   --   --   --   --   --  113 111  --   --   --   --   --  99  BILITOT  --   --   --   --   --   --  0.5 0.6  --   --   --   --   --  0.6  PROT  --   --   --   --   --   --  5.5* 5.6*  --   --   --   --   --  5.6*  ALBUMIN  --   --   --   --   --   --  3.0*  3.0* 3.1*  --   --   --   --   --  2.9*  APTT 29  --   --   --   --   --   --  29  --   --   --   --   --   --   INR 1.16  --   --   --   --   --   --  1.23  --   --   --   --   --   --   LATICACIDVEN  --   --   --  1.01  --   --   --   --   --   --   --   --   --   --   TROPONINI <0.30  --   --   --   --   --   --   --  1.16*  --  0.86* 0.79* 0.48*  --   PROCALCITON  --   --   --   --  0.20  --   --  0.37  --   --   --   --   --  0.25  PROBNP  --  17454.0*  --   --   --   --   --   --  16846.0*  --   --   --   --   --   PHART  --   --  7.178*  --   --  7.311*  --   --   --  7.464*  --   --   --   --   PCO2ART  --   --  94.0*  --   --  60.7*  --   --   --  41.6  --   --   --   --   PO2ART  --   --  87.0  --   --  97.0  --   --   --  69.4*  --   --   --   --     Recent Labs Lab 04/12/13 1311 04/12/13 1507 04/12/13 1909 04/12/13 2331 04/13/13 0336  GLUCAP 93 120* 190* 183* 176*    CXR: Low volumes, ill defined bilateral infiltrates, suspect AB grams in RLL medially.   ASSESSMENT / PLAN: Principal Problem:   Acute respiratory failure with hypercapnia Active Problems:   Pulmonary infiltrates   RBBB   History of renal transplantation   Acute on chronic renal failure   Immunocompromised   DM2 (diabetes mellitus, type 2)   Encephalopathy acute   PULMONARY A: Acute hypercarbic resp failure Resolved  Pulmonary infiltrates - edema vs infection. Worsening bilateral pleural effusion.  P:   -extubated 4/22 well - Flagyl 4/21 (hx of Peptostreptococcus) Titrate oxygen   CARDIOVASCULAR A: Elevated BNP 16846 ( with Renal insufficiency) RBBB - unclear whether old or new Recent atypical CP - Trop  initially negative and then positive, doubt acute coronary syndrome ECG c/w acute ischemia with rising trop levels. Echo with no WMA, EF 60-65% P:  -CVP 9. Goal 10-14 on vent - Metoprolol per tube 50mg  bid - CK and CK-MB normal  - Troponin down trending  - hepatin gtt to be d/c per cards  - cards following  And apprec    RENAL A:  Hx of renal transplant Acute on chronic renal insuff.  Hypokalemia  P:  Prograft levels  Monitor BMET Replace K Followed by renal team IV lasix 160 mg q12hr with good diueresis   GASTROINTESTINAL A:  No issues P:   Advance diet post extubation/ pt passed bedside MD swallow eval on 4/22 PM per P Wright  HEMATOLOGIC A:  No issues P:  Monitor CBC intermittently  INFECTIOUS A:  Immunosuppressed Pulmonary infiltrates, possible PNA. Had 1 dose of 40 mg IV lasix. Has hx of back abscess with Peptostreptococcus.Does not look infected now ?further debridement per surgery? P:   Micro and abx as above Fungal and AFB culture are pending  On Fortaz, Flagyl and Vancomycin  ID following  Get CCS to look at the pts back   ENDOCRINE A:  DM2 Poor glycemic control P:   SSI ordered  NEUROLOGIC A:  Acute enceph due to hypercarbia>>now resolved P:   Stop all sedatives  CC40 Signed:  Jessee Avers, MD PGY-1 Internal Medicine Teaching Service Pager: [phone] 04/13/2013, 7:10 AM   Todays summary Hx of renal tpl, s/p CAP ?atypical infxn, hx of back abscess, demand ischemia without true ACS Plan to extubate, adv diet, get surgery to look at back, cont IV ABX as ordered.     Russell Bowers Beeper  [phone]  Cell  [phone]  If no response or cell goes to voicemail, call beeper [phone]

## 2013-04-14 ENCOUNTER — Inpatient Hospital Stay (HOSPITAL_COMMUNITY): Payer: Medicare Other

## 2013-04-14 DIAGNOSIS — I248 Other forms of acute ischemic heart disease: Secondary | ICD-10-CM

## 2013-04-14 DIAGNOSIS — J14 Pneumonia due to Hemophilus influenzae: Secondary | ICD-10-CM | POA: Diagnosis present

## 2013-04-14 DIAGNOSIS — E43 Unspecified severe protein-calorie malnutrition: Secondary | ICD-10-CM | POA: Diagnosis present

## 2013-04-14 LAB — CBC
MCHC: 30.3 g/dL (ref 30.0–36.0)
Platelets: 145 10*3/uL — ABNORMAL LOW (ref 150–400)
RDW: 17.3 % — ABNORMAL HIGH (ref 11.5–15.5)
WBC: 8.5 10*3/uL (ref 4.0–10.5)

## 2013-04-14 LAB — BASIC METABOLIC PANEL
Calcium: 9.6 mg/dL (ref 8.4–10.5)
GFR calc Af Amer: 36 mL/min — ABNORMAL LOW (ref >90)
GFR calc non Af Amer: 31 mL/min — ABNORMAL LOW (ref >90)
Potassium: 3.7 mEq/L (ref 3.5–5.1)
Sodium: 144 mEq/L (ref 135–145)

## 2013-04-14 LAB — GLUCOSE, CAPILLARY
Glucose-Capillary: 239 mg/dL — ABNORMAL HIGH (ref 70–99)
Glucose-Capillary: 188 mg/dL — ABNORMAL HIGH (ref 70–99)
Glucose-Capillary: 223 mg/dL — ABNORMAL HIGH (ref 70–99)

## 2013-04-14 LAB — TACROLIMUS LEVEL: Tacrolimus (FK506) - LabCorp: 6.3 ng/mL

## 2013-04-14 MED ORDER — INSULIN GLARGINE 100 UNIT/ML ~~LOC~~ SOLN
8.0000 [IU] | Freq: Every day | SUBCUTANEOUS | Status: DC
  Administered 2013-04-14 – 2013-04-17 (×4): 8 [IU] via SUBCUTANEOUS
  Filled 2013-04-14 (×5): qty 0.08

## 2013-04-14 MED ORDER — DEXTROSE 5 % IV SOLN
2.0000 g | INTRAVENOUS | Status: DC
  Administered 2013-04-15: 2 g via INTRAVENOUS
  Filled 2013-04-14: qty 2

## 2013-04-14 MED ORDER — METOPROLOL TARTRATE 50 MG PO TABS
75.0000 mg | ORAL_TABLET | Freq: Two times a day (BID) | ORAL | Status: DC
  Administered 2013-04-14 – 2013-04-15 (×2): 75 mg via ORAL
  Filled 2013-04-14 (×6): qty 1

## 2013-04-14 MED ORDER — FUROSEMIDE 80 MG PO TABS
80.0000 mg | ORAL_TABLET | Freq: Two times a day (BID) | ORAL | Status: DC
  Administered 2013-04-14 – 2013-04-15 (×4): 80 mg via ORAL
  Filled 2013-04-14 (×7): qty 1

## 2013-04-14 MED ORDER — POTASSIUM CHLORIDE CRYS ER 20 MEQ PO TBCR
40.0000 meq | EXTENDED_RELEASE_TABLET | Freq: Once | ORAL | Status: AC
  Administered 2013-04-14: 40 meq via ORAL
  Filled 2013-04-14: qty 2

## 2013-04-14 MED ORDER — AMIODARONE HCL IN DEXTROSE 360-4.14 MG/200ML-% IV SOLN: 30.0000 mg/h | INTRAVENOUS | Status: DC

## 2013-04-14 MED ORDER — DEXTROSE 5 % IV SOLN
2.0000 g | INTRAVENOUS | Status: DC
  Filled 2013-04-14: qty 2

## 2013-04-14 MED ORDER — AMIODARONE HCL 200 MG PO TABS
200.0000 mg | ORAL_TABLET | Freq: Two times a day (BID) | ORAL | Status: DC
  Administered 2013-04-14: 200 mg via ORAL
  Filled 2013-04-14 (×2): qty 1

## 2013-04-14 MED ORDER — AMIODARONE HCL IN DEXTROSE 360-4.14 MG/200ML-% IV SOLN: 60.0000 mg/h | INTRAVENOUS | Status: DC

## 2013-04-14 MED ORDER — AMIODARONE HCL IN DEXTROSE 360-4.14 MG/200ML-% IV SOLN
10.0000 mg/h | INTRAVENOUS | Status: DC
  Filled 2013-04-14: qty 200

## 2013-04-14 MED ORDER — AMIODARONE HCL 200 MG PO TABS: 200.0000 mg | ORAL_TABLET | Freq: Every day | ORAL | Status: DC

## 2013-04-14 MED ORDER — FUROSEMIDE 80 MG PO TABS
160.0000 mg | ORAL_TABLET | Freq: Two times a day (BID) | ORAL | Status: DC
  Filled 2013-04-14 (×4): qty 2

## 2013-04-14 NOTE — Progress Notes (Addendum)
PULMONARY  / CRITICAL CARE MEDICINE  Name: Russell Bowers MRN: [id number] DOB: 05/06/1951    ADMISSION DATE:  04/11/2013   BRIEF PATIENT DESCRIPTION:  59 male with hx of renal transplant 11/2011, intubated by PCCM in ED for acute on chronic hypercarbic respiratory failure after 1-2 months of cough, atypical chest pain and marked increase in DOE over day or two PTA. Bilateral pulmonary infiltrates on CXR. Very similar admission in Feb 2013 @ [place].   SIGNIFICANT EVENTS / STUDIES:  4/20 admitted with hypercarbic respiratory failure, pulmonary infiltrates, acute on chronic renal insuff, RBBB 4/22 extubated  LINES / TUBES: ETT 4/20 >> 4/22 R IJ CVL 4/20 >>4/23   CULTURES: MRSA PCR 4/20 >> NEG Strep Ag 4/20 >> NEG Legionella Ag 4/20 >>Negative  Streptococcal Urine Ag 4/20 >> negative   Urine 4/20 >> No growth Resp virus panel 4/20 >> negative  PJP DFA 4/20 >> negative  BAL cx 4/20 >> H. Influenzae, beta lactam neg Fungal respiratory cx 4/21 >> AFB resp. cx 4/21 >> AFB smear - negative  Blood 4/20 >> NGTD   ANTIBIOTICS: ABX per ID  Vanc 4/20 >> 4/23 Cefaz 4/20 >>  Levoflox 4/20 >> 4/21  Flagyl 4/21>>   SUBJECTIVE:  Tachycardia with soft blood pressure. PM lasix dose, IV bolus 500 cc and started on Amiodarone gtt by cards.  No other symptoms for now. He feels good. No SOB or chest pain.  VITAL SIGNS: Temp:  [95.4 F (35.2 C)-98.3 F (36.8 C)] 98.3 F (36.8 C) (04/23 0002) Pulse Rate:  [61-135] 109 (04/23 0600) Resp:  [16-28] 26 (04/23 0600) BP: (81-126)/(47-74) 124/60 mmHg (04/23 0600) SpO2:  [89 %-100 %] 94 % (04/23 0600) FiO2 (%):  [40 %-40.4 %] 40 % (04/22 0900) Weight:  [219 lb 2.2 oz (99.4 kg)] 219 lb 2.2 oz (99.4 kg) (04/23 0441) HEMODYNAMICS: CVP:  [4 mmHg-6 mmHg] 6 mmHg VENTILATOR SETTINGS: On nasal cannulae  INTAKE / OUTPUT: Intake/Output     04/22 0701 - 04/23 0700   P.O. 360   I.V. (mL/kg) 854.9 (8.6)   NG/GT 30   IV Piggyback 616   Total  Intake(mL/kg) 1860.9 (18.7)   Urine (mL/kg/hr) 1570 (0.7)   Stool 4 (0)   Total Output 1574   Net +286.9       Stool Occurrence 1 x     PHYSICAL EXAMINATION: General: Appears comfortable. Fully alert and in no distress. Extubated.  Neuro:  RASS 0, interactive  HEENT:  [place]/AT, PERRL, EOMI Cardiovascular:  RRR, no murmurs  Lungs:  Scattered rhonchi anteriorly, bibasilar bronchial BS posteriorly Abdomen:  Mod obese, soft, NT, NABS Back: Abscess site look clean with a small area of induration with erythema.  EXT: warm, no edema Skin:  No other lesions  LABS:  Recent Labs Lab 04/11/13 1155 04/11/13 1156  04/11/13 1204 04/11/13 1218 04/11/13 1542 04/11/13 1606 04/11/13 2216 04/12/13 0400 04/12/13 0502 04/12/13 0514 04/12/13 0805 04/12/13 1312 04/12/13 2105 04/13/13 0500 04/13/13 0915 04/14/13 0435  HGB 14.5  --   --   --   --   --   --   --  12.3*  --   --   --   --   --  12.2*  --  12.1*  WBC 12.6*  --   --   --   --   --   --   --  11.8*  --   --   --   --   --  11.0*  --  8.5  PLT 218  --   --   --   --   --   --   --  176  --   --   --   --   --  152  --  145*  NA 137  --   --   --   --   --   --  139 142  --   --   --   --   --  145  --  144  K 5.0  --   --   --   --   --   --  3.8 3.5  --   --   --   --   --  3.3*  --  3.7  CL 95*  --   --   --   --   --   --  98 100  --   --   --   --   --  102  --  103  CO2 33*  --   --   --   --   --   --  33* 30  --   --   --   --   --  35*  --  35*  GLUCOSE 381*  --   --   --   --   --   --  150* 73  --   --   --   --   --  208*  --  179*  BUN 56*  --   --   --   --   --   --  58* 56*  --   --   --   --   --  63*  --  54*  CREATININE 2.72*  --   --   --   --   --   --  2.66* 2.68*  --   --   --   --   --  2.29*  --  2.17*  CALCIUM 9.6  --   --   --   --   --   --  9.3 9.6  --   --   --   --   --  9.7  --  9.6  MG  --   --   --   --   --   --   --   --  1.9  --   --   --   --   --   --   --  1.9  PHOS  --   --   --   --   --    --   --  1.9* 1.9*  --   --   --   --   --  2.7  --   --   AST  --   --   --   --   --   --   --  37 26  --   --   --   --   --  12  --   --   ALT  --   --   --   --   --   --   --  68* 61*  --   --   --   --   --  37  --   --   ALKPHOS  --   --   --   --   --   --   --  113 111  --   --   --   --   --  99  --   --   BILITOT  --   --   --   --   --   --   --  0.5 0.6  --   --   --   --   --  0.6  --   --   PROT  --   --   --   --   --   --   --  5.5* 5.6*  --   --   --   --   --  5.6*  --   --   ALBUMIN  --   --   --   --   --   --   --  3.0*  3.0* 3.1*  --   --   --   --   --  2.9*  --   --   APTT 29  --   --   --   --   --   --   --  29  --   --   --   --   --   --   --   --   INR 1.16  --   --   --   --   --   --   --  1.23  --   --   --   --   --   --   --   --   LATICACIDVEN  --   --   --   --  1.01  --   --   --   --   --   --   --   --   --   --   --   --   TROPONINI <0.30  --   --   --   --   --   --   --   --  1.16*  --  0.86* 0.79* 0.48*  --   --   --   PROCALCITON  --   --   --   --   --  0.20  --   --  0.37  --   --   --   --   --  0.25  --   --   PROBNP  --  17454.0*  --   --   --   --   --   --   --  16846.0*  --   --   --   --   --   --   --   PHART  --   --   < > 7.178*  --   --  7.311*  --   --   --  7.464*  --   --   --   --  7.450  --   PCO2ART  --   --   < > 94.0*  --   --  60.7*  --   --   --  41.6  --   --   --   --  47.9*  --   PO2ART  --   --   < > 87.0  --   --  97.0  --   --   --  69.4*  --   --   --   --  72.5*  --   < > = values in this interval not displayed.  Recent Labs Lab 04/13/13 0803 04/13/13 1127 04/13/13 1635 04/13/13 2218 04/13/13 2321  GLUCAP 210* 147* 228* 163* 168*    CXR: Low volumes, ill defined bilateral infiltrates, suspect AB grams in RLL medially. PCXR today looks better with improved aeration bilaterally.   ASSESSMENT / PLAN: Principal Problem:   Acute respiratory failure with hypercapnia Active Problems:   Pulmonary infiltrates    RBBB   History of renal transplantation   Acute on chronic renal failure   Immunocompromised   DM2 (diabetes mellitus, type 2)   Encephalopathy acute   Acute diastolic CHF (congestive heart failure)   Hypokalemia   Demand ischemia of myocardium   Protein-calorie malnutrition, severe   PULMONARY A: Acute hypercarbic resp failure Resolved  Pulmonary infiltrates - edema vs infection. Improved aeration. On nasal cannular oxygen.  P:   -extubated 4/22 well -Flagyl 4/21 (hx of Peptostreptococcus from back abscess 03/15/2013) - continue with supplementary oxygen by [place]   CARDIOVASCULAR A: Elevated BNP 16846 ( with Renal insufficiency) RBBB - unclear whether old or new Possible demand ischemia, with transient elevation in troponins ECG c/w acute ischemia with rising trop levels. Echo with no WMA, EF 60-65% Tachycardia and Nonsustained PVC, and soft BP  Better with amiodarone drip and followed by CARDS P:  - increase Metoprolol PO 75 mg bid Needs CArds f/u - 4/22 >>off hepatin gtt, trop trended down  - Amiodarone gtt 4/23>> transition to po amiodarone    RENAL A:  Hx of renal transplant Acute on chronic renal insuff.  Hypokalemia - Improved.  P:   Prograft levels pending  Monitor BMET Renal team appreciated for input  IV lasix 160 mg q12hr with good diueresis>>consider reduced doses  GASTROINTESTINAL A:  No issues P:   Advance diet   HEMATOLOGIC A:  No issues P:  Monitor CBC intermittently  INFECTIOUS A:  Immunosuppressed Pulmonary infiltrates,d/t H Parainfluenza PNA.  Has hx of back abscess with Peptostreptococcus. Does not look infected now ?further debridement per surgery? H parainfluenza on BAL isolate P:   Micro and abx as above Fungal and AFB culture are pending  On Fortaz, Flagyl and Vancomycin  Plan dc of vancomycin and cont other ABX per ID svc  ID following and appreciated CCS planning elective back debridement after acute illness.  ENDOCRINE A:   DM2 Poor glycemic control P:   SSI ordered Add lantus 8u qd CHO modified diet  NEUROLOGIC A:  Acute enceph due to hypercarbia>>now resolved P:   Stopped all sedatives. Will be transferred to tele  Transfer to floor, KEEP ON PCCM PRIMARY SVC  Due to pt complexity Signed:  Jessee Avers, MD PGY-1 Internal Medicine Teaching Service Pager: [phone] 04/14/2013, 6:46 AM    Mariel Sleet Beeper  [phone]  Cell  [phone]  If no response or cell goes to voicemail, call beeper [phone]

## 2013-04-14 NOTE — Progress Notes (Signed)
Inpatient Diabetes Program Recommendations  AACE/ADA: New Consensus Statement on Inpatient Glycemic Control (2013)  Target Ranges:  Prepandial:   less than 140 mg/dL      Peak postprandial:   less than 180 mg/dL (1-2 hours)      Critically ill patients:  140 - 180 mg/dL   Reason for Visit: Results for Russell Bowers, Russell Bowers (MRN [id number]) as of 04/14/2013 11:27  Ref. Range 04/13/2013 16:35 04/13/2013 22:18 04/13/2013 23:21 04/14/2013 07:05 04/14/2013 11:13  Glucose-Capillary Latest Range: 70-99 mg/dL 228 (H) 163 (H) 168 (H) 184 (H) 239 (H)   Please consider adding Lantus 8 units daily.  Also please make diet order CHO modified also.

## 2013-04-14 NOTE — Progress Notes (Addendum)
Subjective:  Telemetry currently demonstrates tachycardia with clear P waves preceding QRS complexes that are at times multifocal.amiodarone was begun overnight. Now by mouth.  Objective:  Vital Signs in the last 24 hours: Temp:  [97.8 F (36.6 C)-98.3 F (36.8 C)] 97.8 F (36.6 C) (04/23 1057) Pulse Rate:  [61-135] 112 (04/23 1057) Resp:  [19-29] 19 (04/23 1057) BP: (81-129)/(47-76) 110/69 mmHg (04/23 1057) SpO2:  [89 %-99 %] 96 % (04/23 1057) Weight:  [99.4 kg (219 lb 2.2 oz)] 99.4 kg (219 lb 2.2 oz) (04/23 0441)  Intake/Output from previous day: 04/22 0701 - 04/23 0700 In: 1977.6 [P.O.:360; I.V.:871.6; NG/GT:30; IV Piggyback:716] Out: [id number] [Urine:1570; Stool:4]   Physical Exam: General: Well developed, well nourished, in no acute distress. Head:  Normocephalic and atraumatic. Lungs: basilar crackles bilaterally. Heart: tachycardic regular with occasional ectopy  2/6 systolic murmur, no rubs or gallops.  Abdomen: soft, non-tender, positive bowel sounds.overweight Extremities: No clubbing or cyanosis. No edema. Neurologic: Alert and oriented x 3.    Lab Results:  Recent Labs  04/13/13 0500 04/14/13 0435  WBC 11.0* 8.5  HGB 12.2* 12.1*  PLT 152 145*    Recent Labs  04/13/13 0500 04/14/13 0435  NA 145 144  K 3.3* 3.7  CL 102 103  CO2 35* 35*  GLUCOSE 208* 179*  BUN 63* 54*  CREATININE 2.29* 2.17*    Recent Labs  04/12/13 1312 04/12/13 2105  TROPONINI 0.79* 0.48*   Hepatic Function Panel  Imaging: Dg Chest Port 1 View  04/14/2013  *RADIOLOGY REPORT*  Clinical Data: Extubation, shortness of breath  PORTABLE CHEST - 1 VIEW  Comparison: Portable exam 0501 hours compared to 04/13/2013  Findings: Tip of right jugular line stable projecting over high right atrium. Enlargement of cardiac silhouette with vascular congestion. Bibasilar atelectasis versus consolidation. Associated left pleural effusion. No pneumothorax or acute osseous findings.  IMPRESSION:  Persistent bibasilar opacities and left pleural effusion. Enlargement of cardiac silhouette with pulmonary vascular congestion.   Original Report Authenticated By: Lavonia Dana, M.D.    Dg Chest Port 1 View  04/13/2013  *RADIOLOGY REPORT*  Clinical Data: Pleural effusions and airspace disease.  Ventilated patient.  PORTABLE CHEST - 1 VIEW  Comparison: Chest x-ray 04/12/2013.  Findings: An endotracheal tube is in place with tip 1.7 cm above the carina. There is a right-sided internal jugular central venous catheter with tip terminating in the right atrium. Nasogastric tube is seen extending into either of the antral prepyloric region of the stomach or the proximal duodenum.  Lung volumes are extremely low, and there are extensive bibasilar opacities which may reflect areas of atelectasis and/or consolidation.  Small left pleural effusion.  Pulmonary venous congestion, accentuated by low lung volumes, without frank pulmonary edema.  Cardiac silhouette is largely obscured, but appears likely to be enlarged. The patient is rotated to the right on today's exam, resulting in distortion of the mediastinal contours and reduced diagnostic sensitivity and specificity for mediastinal pathology.  IMPRESSION: 1.  Support apparatus, as above. 2.  Severely decreased lung volumes with persistent bibasilar atelectasis and/or consolidation and small left pleural effusion.   Original Report Authenticated By: Vinnie Langton, M.D.    Personally viewed.   Telemetry: multifocal atrial rhythm. Tachycardic. Personally viewed.   EKG:  Sinus rhythm with PVCs. From 04/13/13.  Cardiac Studies:  EF 60-65%. Mild aortic stenosis.  Assessment/Plan:  Principal Problem:   Acute respiratory failure with hypercapnia Active Problems:   Pulmonary infiltrates   RBBB   History of  renal transplantation   Acute on chronic renal failure   Immunocompromised   DM2 (diabetes mellitus, type 2)   Encephalopathy acute   Acute diastolic CHF  (congestive heart failure)   Hypokalemia   Demand ischemia of myocardium   Protein-calorie malnutrition, severe   Pneumonia due to Hemophilus influenzae (H. influenzae)   62 year old male with hypercarbic respiratory failure increasing dyspnea on exertion for a day or 2 prior to admission, bibasilar pulmonary infiltrates similar to February 2013 with mildly elevated troponin, recent multifocal atrial tachycardia, normal ejection fraction. Mild aortic stenosis.  1. Mildly elevated troponin-non-ST elevation myocardial infarction type II. Likely secondary to demand ischemia in the setting of hypercarbic respiratory failure. Reassuring EF.elevated BNP originally 16,000. Right bundle branch block noted.currently off of heparin drip.continue with aspirin. It is also nonreducible to continue with Lasix dosing. Continue to supply with potassium as needed.  2. Tachycardia/MAT-in review of telemetry, he does appear to have P waves preceding QRS complexes. This does not appear to be atrial fibrillation in the tracings that I visualized. Nonetheless, amiodarone was begun IV because of persistent issues with tachycardia last night..Because of the potential interactions between amiodarone and Prograf, I will discontinue the amiodarone.  I also agree with increasing metoprolol to 75 by mouth twice a day. Monitor for evidence of hypotension.he may very well need 100 mg twice a day of metoprolol.   3. Respiratory failure-resolved. Crackles still heard on exam, resolving pneumonia.  4. Mild aortic stenosis-should not be hemodynamically significant.  5. Renal transplant-Prograf Mycophenolate.  Kyndall Chaplin, [place] 04/14/2013, 1:46 PM

## 2013-04-14 NOTE — Progress Notes (Signed)
[facility]ious [facility]  Day # 4 ceftazidime Day # 3  flagyl  Subjective: Feeling much better  Antibiotics:  Anti-infectives   Start     Dose/Rate Route Frequency Ordered Stop   04/13/13 1600  levofloxacin (LEVAQUIN) IVPB 750 mg  Status:  Discontinued     750 mg 100 mL/hr over 90 Minutes Intravenous Every 48 hours 04/11/13 1559 04/12/13 1342   04/12/13 1400  vancomycin (VANCOCIN) 1,250 mg in sodium chloride 0.9 % 250 mL IVPB  Status:  Discontinued     1,250 mg 166.7 mL/hr over 90 Minutes Intravenous Every 24 hours 04/11/13 1557 04/14/13 0845   04/12/13 1400  metroNIDAZOLE (FLAGYL) IVPB 500 mg     500 mg 100 mL/hr over 60 Minutes Intravenous Every 8 hours 04/12/13 1342     04/11/13 1800  cefTAZidime (FORTAZ) 2 g in dextrose 5 % 50 mL IVPB  Status:  Discontinued     2 g 100 mL/hr over 30 Minutes Intravenous Every 12 hours 04/11/13 1557 04/14/13 1835   04/11/13 1345  levofloxacin (LEVAQUIN) IVPB 750 mg     750 mg 100 mL/hr over 90 Minutes Intravenous  Once 04/11/13 1330 04/11/13 1725   04/11/13 1345  vancomycin (VANCOCIN) IVPB 1000 mg/200 mL premix     1,000 mg 200 mL/hr over 60 Minutes Intravenous  Once 04/11/13 1330 04/11/13 1455   04/11/13 1345  piperacillin-tazobactam (ZOSYN) IVPB 3.375 g  Status:  Discontinued     3.375 g 12.5 mL/hr over 240 Minutes Intravenous  Once 04/11/13 1330 04/11/13 2250      Medications: Scheduled Meds: . aspirin EC  325 mg Oral Daily  . furosemide  80 mg Oral BID  . heparin subcutaneous  5,000 Units Subcutaneous Q8H  . insulin aspart  0-15 Units Subcutaneous TID AC & HS  . insulin glargine  8 Units Subcutaneous QHS  . metoprolol tartrate  75 mg Oral BID  . metronidazole  500 mg Intravenous Q8H  . mycophenolate  360 mg Oral BID  . pantoprazole  40 mg Oral QHS  . tacrolimus  3 mg Oral BID   Continuous Infusions:   PRN Meds:.sodium chloride   Objective: Weight change: 14.1 oz (0.4 kg)  Intake/Output Summary (Last 24  hours) at 04/14/13 1838 Last data filed at 04/14/13 1300  Gross per 24 hour  Intake 1816.7 ml  Output    525 ml  Net 1291.7 ml   Blood pressure 112/70, pulse 110, temperature 98 F (36.7 C), temperature source Oral, resp. rate 18, height 5\' 8"  (1.727 m), weight 219 lb 2.2 oz (99.4 kg), SpO2 95.00%. Temp:  [97.8 F (36.6 C)-98.3 F (36.8 C)] 98 F (36.7 C) (04/23 1345) Pulse Rate:  [61-135] 110 (04/23 1345) Resp:  [18-29] 18 (04/23 1345) BP: (81-129)/(47-76) 112/70 mmHg (04/23 1345) SpO2:  [89 %-97 %] 95 % (04/23 1345) Weight:  [219 lb 2.2 oz (99.4 kg)] 219 lb 2.2 oz (99.4 kg) (04/23 0441)  Physical Exam: HEENT: anicteric sclera, EOMI CVS regular rate, normal r, no murmur rubs or gallops  Chest: dec bs at bases  Abdomen: soft nontender, nondistended, normal bowel sounds,  Extremities: no clubbing or edema noted bilaterally  Skin: no rashes, there  is an area of induration and slight erythema at the prior incision and debridement performed in his lower back.  Neuro: nonfocal, strength and sensation intact  Lab Results:  Recent Labs  04/13/13 0500 04/14/13 0435  WBC 11.0* 8.5  HGB 12.2* 12.1*  HCT 38.5* 40.0  PLT 152 145*    BMET  Recent Labs  04/13/13 0500 04/14/13 0435  NA 145 144  K 3.3* 3.7  CL 102 103  CO2 35* 35*  GLUCOSE 208* 179*  BUN 63* 54*  CREATININE 2.29* 2.17*  CALCIUM 9.7 9.6    Micro Results: Recent Results (from the past 240 hour(s))  CULTURE, BLOOD (ROUTINE X 2)     Status: None   Collection Time    04/11/13 12:30 PM      Result Value Range Status   Specimen Description BLOOD RIGHT HAND   Final   Special Requests BOTTLES DRAWN AEROBIC AND ANAEROBIC 10CC   Final   Culture  Setup Time 04/11/2013 17:11   Final   Culture     Final   Value:        BLOOD CULTURE RECEIVED NO GROWTH TO DATE CULTURE WILL BE HELD FOR 5 DAYS BEFORE ISSUING A FINAL NEGATIVE REPORT   Report Status PENDING   Incomplete  CULTURE, BLOOD (ROUTINE X 2)     Status: None    Collection Time    04/11/13 12:40 PM      Result Value Range Status   Specimen Description BLOOD RIGHT ARM   Final   Special Requests BOTTLES DRAWN AEROBIC ONLY 10CC   Final   Culture  Setup Time 04/11/2013 17:11   Final   Culture     Final   Value:        BLOOD CULTURE RECEIVED NO GROWTH TO DATE CULTURE WILL BE HELD FOR 5 DAYS BEFORE ISSUING A FINAL NEGATIVE REPORT   Report Status PENDING   Incomplete  PNEUMOCYSTIS JIROVECI SMEAR BY DFA     Status: None   Collection Time    04/11/13  3:49 PM      Result Value Range Status   Specimen Source-PJSRC BRONCHIAL ALVEOLAR LAVAGE   Final   Pneumocystis jiroveci Ag NEGATIVE   Final   Comment: Performed at [facility], [facility]     Status: None   Collection Time    04/11/13  3:49 PM      Result Value Range Status   Specimen Description BRONCHIAL ALVEOLAR LAVAGE   Final   Special Requests NONE   Final   Gram Stain     Final   Value: MODERATE WBC PRESENT,BOTH PMN AND MONONUCLEAR     RARE SQUAMOUS EPITHELIAL CELLS PRESENT     NO ORGANISMS SEEN   Colony Count 20,OOO COLONIES/ML   Final   Culture     Final   Value: HAEMOPHILUS PARAINFLUENZAE     Note: BETA LACTAMASE NEGATIVE   Report Status 04/13/2013 FINAL   Final  URINE CULTURE     Status: None   Collection Time    04/11/13  3:51 PM      Result Value Range Status   Specimen Description URINE, CATHETERIZED   Final   Special Requests NONE   Final   Culture  Setup Time 04/11/2013 21:43   Final   Colony Count NO GROWTH   Final   Culture NO GROWTH   Final   Report Status 04/13/2013 FINAL   Final  MRSA PCR SCREENING     Status: None   Collection Time    04/11/13  4:39 PM      Result Value Range Status   MRSA by PCR NEGATIVE  NEGATIVE Final   Comment:  The GeneXpert MRSA Assay (FDA     approved for NASAL specimens     only), is one component of a     comprehensive MRSA colonization     surveillance program. It is not     intended to  diagnose MRSA     infection nor to guide or     monitor treatment for     MRSA infections.  RESPIRATORY VIRUS PANEL     Status: None   Collection Time    04/11/13  4:52 PM      Result Value Range Status   Source - RVPAN NASAL WASHINGS   Corrected   Comment: CORRECTED ON 04/21 AT 2314: PREVIOUSLY REPORTED AS NASAL WASHINGS   Respiratory Syncytial Virus A NOT DETECTED   Final   Respiratory Syncytial Virus B NOT DETECTED   Final   Influenza A NOT DETECTED   Final   Influenza B NOT DETECTED   Final   Parainfluenza 1 NOT DETECTED   Final   Parainfluenza 2 NOT DETECTED   Final   Parainfluenza 3 NOT DETECTED   Final   Metapneumovirus NOT DETECTED   Final   Rhinovirus NOT DETECTED   Final   Adenovirus NOT DETECTED   Final   Influenza A H1 NOT DETECTED   Final   Influenza A H3 NOT DETECTED   Final   Comment: (NOTE)           Normal Reference Range for each Analyte: NOT DETECTED     Testing performed using the Luminex xTAG Respiratory Viral Panel test     kit.     This test was developed and its performance characteristics determined     by [employer]. It has not been cleared or approved by the [place]     Food and Drug Administration. This test is used for clinical purposes.     It should not be regarded as investigational or for research. This     laboratory is certified under the [place] (CLIA) as qualified to perform high complexity     clinical laboratory testing.  FUNGUS CULTURE W SMEAR     Status: None   Collection Time    04/12/13  3:50 PM      Result Value Range Status   Specimen Description TRACHEAL ASPIRATE   Final   Special Requests Immunocompromised   Final   Fungal Smear NO YEAST OR FUNGAL ELEMENTS SEEN   Final   Culture CULTURE IN PROGRESS FOR FOUR WEEKS   Final   Report Status PENDING   Incomplete  AFB CULTURE WITH SMEAR     Status: None   Collection Time    04/12/13  3:51 PM      Result Value Range Status    Specimen Description TRACHEAL ASPIRATE   Final   Special Requests Immunocompromised   Final   ACID FAST SMEAR NO ACID FAST BACILLI SEEN   Final   Culture     Final   Value: CULTURE WILL BE EXAMINED FOR 6 WEEKS BEFORE ISSUING A FINAL REPORT   Report Status PENDING   Incomplete    Studies/Results: Dg Chest Port 1 View  04/14/2013  *RADIOLOGY REPORT*  Clinical Data: Extubation, shortness of breath  PORTABLE CHEST - 1 VIEW  Comparison: Portable exam 0501 hours compared to 04/13/2013  Findings: Tip of right jugular line stable projecting over high right atrium. Enlargement of cardiac silhouette with vascular congestion. Bibasilar  atelectasis versus consolidation. Associated left pleural effusion. No pneumothorax or acute osseous findings.  IMPRESSION: Persistent bibasilar opacities and left pleural effusion. Enlargement of cardiac silhouette with pulmonary vascular congestion.   Original Report Authenticated By: Lavonia Dana, M.D.    Dg Chest Port 1 View  04/13/2013  *RADIOLOGY REPORT*  Clinical Data: Pleural effusions and airspace disease.  Ventilated patient.  PORTABLE CHEST - 1 VIEW  Comparison: Chest x-ray 04/12/2013.  Findings: An endotracheal tube is in place with tip 1.7 cm above the carina. There is a right-sided internal jugular central venous catheter with tip terminating in the right atrium. Nasogastric tube is seen extending into either of the antral prepyloric region of the stomach or the proximal duodenum.  Lung volumes are extremely low, and there are extensive bibasilar opacities which may reflect areas of atelectasis and/or consolidation.  Small left pleural effusion.  Pulmonary venous congestion, accentuated by low lung volumes, without frank pulmonary edema.  Cardiac silhouette is largely obscured, but appears likely to be enlarged. The patient is rotated to the right on today's exam, resulting in distortion of the mediastinal contours and reduced diagnostic sensitivity and specificity for  mediastinal pathology.  IMPRESSION: 1.  Support apparatus, as above. 2.  Severely decreased lung volumes with persistent bibasilar atelectasis and/or consolidation and small left pleural effusion.   Original Report Authenticated By: Vinnie Langton, M.D.       Assessment/Plan: SHERRIL MAZAR is a 62 y.o. male with  62 year old man who is status post renal transplantation he was admitted to [facility] with respiratory failure after several month history of dyspnea cough and chest pain. Overlapping this same time interval he had developed an abscess in his back which was incised and drained cultures have yielded Peptostreptococcus. His renal transplantation was performed in December of 0000000 and was complicated by 2 episodes of respiratory failure requiring mechanical intubation in February of 2013. At that time similar to now he was treated with broad-spectrum antibiotics but no culprit pathogen was yielded and he improved.   His BAL culture are positive for beta lactamase NEGATIVE H parainfluenza   fungal smear, cx cooking AFB taken no results yet  #1 HCAP due to H parainfluenza in pt  with renal transplantation:   --agree with dc vancomycin --I changed ceftazidime to cefepime prior to knowning the results of the BAL (either abx or others active vs H parainfluenza are ALL fine)  --I would treat the patient for a total of 7 days of antibiotics (currently on cefepime and flagyl) --If he does really well would consider change to renally dosed augmentin  #2 Back abscess with peptostreptococcus: [facility] has No further plans for debridement --he will fu with [facility] in their clinic   I will sign off for now. We would be happy to see pt in [facility] clinic for followup if needed.   LOS: 3 days   Alcide Evener 04/14/2013, 6:38 PM

## 2013-04-14 NOTE — Progress Notes (Signed)
04/14/2013 4:08 PM Nursing note Central line d/c per orders and per protocol. Ends intact. Pt. Tolerated well. Pressure held for 10 minutes. Occlusive dressing applied to site. Advised bed rest for 30 min. Will continue to closely monitor patient.  Kaiden Dardis, [name]

## 2013-04-14 NOTE — Progress Notes (Signed)
Subjective: Interval History: has no complaint of doing better each day.  Objective: Vital signs in last 24 hours: Temp:  [95.4 F (35.2 C)-98.3 F (36.8 C)] 98.3 F (36.8 C) (04/23 0002) Pulse Rate:  [61-135] 119 (04/23 0700) Resp:  [16-29] 29 (04/23 0700) BP: (81-129)/(47-74) 129/65 mmHg (04/23 0700) SpO2:  [89 %-100 %] 94 % (04/23 0700) FiO2 (%):  [40 %-40.4 %] 40 % (04/22 0900) Weight:  [99.4 kg (219 lb 2.2 oz)] 99.4 kg (219 lb 2.2 oz) (04/23 0441) Weight change: 0.4 kg (14.1 oz)  Intake/Output from previous day: 04/22 0701 - 04/23 0700 In: 1977.6 [P.O.:360; I.V.:871.6; NG/GT:30; IV Piggyback:716] Out: 1574 [Urine:1570; Stool:4] Intake/Output this shift:    General appearance: alert, cooperative and moderately obese Resp: diminished breath sounds bilaterally and rales bibasilar Cardio: S1, S2 normal, systolic murmur: holosystolic 2/6, blowing at apex and rate 110-120, freq prematures and irreg GI: pos bs,soft,liver down4 cm , Tx RLQ Extremities: edema 1+  Lab Results:  Recent Labs  04/13/13 0500 04/14/13 0435  WBC 11.0* 8.5  HGB 12.2* 12.1*  HCT 38.5* 40.0  PLT 152 145*   BMET:  Recent Labs  04/13/13 0500 04/14/13 0435  NA 145 144  K 3.3* 3.7  CL 102 103  CO2 35* 35*  GLUCOSE 208* 179*  BUN 63* 54*  CREATININE 2.29* 2.17*  CALCIUM 9.7 9.6   No results found for this basename: PTH,  in the last 72 hours Iron Studies: No results found for this basename: IRON, TIBC, TRANSFERRIN, FERRITIN,  in the last 72 hours  Studies/Results: Dg Chest Port 1 View  04/13/2013  *RADIOLOGY REPORT*  Clinical Data: Pleural effusions and airspace disease.  Ventilated patient.  PORTABLE CHEST - 1 VIEW  Comparison: Chest x-ray 04/12/2013.  Findings: An endotracheal tube is in place with tip 1.7 cm above the carina. There is a right-sided internal jugular central venous catheter with tip terminating in the right atrium. Nasogastric tube is seen extending into either of the antral  prepyloric region of the stomach or the proximal duodenum.  Lung volumes are extremely low, and there are extensive bibasilar opacities which may reflect areas of atelectasis and/or consolidation.  Small left pleural effusion.  Pulmonary venous congestion, accentuated by low lung volumes, without frank pulmonary edema.  Cardiac silhouette is largely obscured, but appears likely to be enlarged. The patient is rotated to the right on today's exam, resulting in distortion of the mediastinal contours and reduced diagnostic sensitivity and specificity for mediastinal pathology.  IMPRESSION: 1.  Support apparatus, as above. 2.  Severely decreased lung volumes with persistent bibasilar atelectasis and/or consolidation and small left pleural effusion.   Original Report Authenticated By: Vinnie Langton, M.D.     I have reviewed the patient's current medications.  Assessment/Plan: 1 Renal Tx vol stable, try for mild neg.  Cr baseline 2 Resp failure appears all cardiac.  No cultures pos, Normal WBC, no temp.  Needs more eval in future. 3 DM controlled 4 Obesity P Po Lasix, K , cont immunosuppress., cardiac eval    LOS: 3 days   Ladora Osterberg L 04/14/2013,7:16 AM

## 2013-04-14 NOTE — Progress Notes (Signed)
ANTIBIOTIC CONSULT NOTE - INITIAL  Pharmacy Consult for cefepime Indication: PNA/back abscess  No Known Allergies  Patient Measurements: Height: 5\' 8"  (172.7 cm) Weight: 219 lb 2.2 oz (99.4 kg) IBW/kg (Calculated) : 68.4 Weight = 100.2 kg  Labs:  Recent Labs  04/12/13 0400 04/13/13 0500 04/14/13 0435  WBC 11.8* 11.0* 8.5  HGB 12.3* 12.2* 12.1*  PLT 176 152 145*  CREATININE 2.68* 2.29* 2.17*   Estimated Creatinine Clearance: 40.9 ml/min (by C-G formula based on Cr of 2.17). No results found for this basename: VANCOTROUGH, Corlis Leak, VANCORANDOM, GENTTROUGH, GENTPEAK, GENTRANDOM, TOBRATROUGH, TOBRAPEAK, TOBRARND, AMIKACINPEAK, AMIKACINTROU, AMIKACIN,  in the last 72 hours   Microbiology: Recent Results (from the past 720 hour(s))  CULTURE, BLOOD (ROUTINE X 2)     Status: None   Collection Time    04/11/13 12:30 PM      Result Value Range Status   Specimen Description BLOOD RIGHT HAND   Final   Special Requests BOTTLES DRAWN AEROBIC AND ANAEROBIC 10CC   Final   Culture  Setup Time 04/11/2013 17:11   Final   Culture     Final   Value:        BLOOD CULTURE RECEIVED NO GROWTH TO DATE CULTURE WILL BE HELD FOR 5 DAYS BEFORE ISSUING A FINAL NEGATIVE REPORT   Report Status PENDING   Incomplete  CULTURE, BLOOD (ROUTINE X 2)     Status: None   Collection Time    04/11/13 12:40 PM      Result Value Range Status   Specimen Description BLOOD RIGHT ARM   Final   Special Requests BOTTLES DRAWN AEROBIC ONLY 10CC   Final   Culture  Setup Time 04/11/2013 17:11   Final   Culture     Final   Value:        BLOOD CULTURE RECEIVED NO GROWTH TO DATE CULTURE WILL BE HELD FOR 5 DAYS BEFORE ISSUING A FINAL NEGATIVE REPORT   Report Status PENDING   Incomplete  PNEUMOCYSTIS JIROVECI SMEAR BY DFA     Status: None   Collection Time    04/11/13  3:49 PM      Result Value Range Status   Specimen Source-PJSRC BRONCHIAL ALVEOLAR LAVAGE   Final   Pneumocystis jiroveci Ag NEGATIVE   Final   Comment:  Performed at [place], [facility]     Status: None   Collection Time    04/11/13  3:49 PM      Result Value Range Status   Specimen Description BRONCHIAL ALVEOLAR LAVAGE   Final   Special Requests NONE   Final   Gram Stain     Final   Value: MODERATE WBC PRESENT,BOTH PMN AND MONONUCLEAR     RARE SQUAMOUS EPITHELIAL CELLS PRESENT     NO ORGANISMS SEEN   Colony Count 20,OOO COLONIES/ML   Final   Culture     Final   Value: HAEMOPHILUS PARAINFLUENZAE     Note: BETA LACTAMASE NEGATIVE   Report Status 04/13/2013 FINAL   Final  URINE CULTURE     Status: None   Collection Time    04/11/13  3:51 PM      Result Value Range Status   Specimen Description URINE, CATHETERIZED   Final   Special Requests NONE   Final   Culture  Setup Time 04/11/2013 21:43   Final   Colony Count NO GROWTH   Final   Culture NO GROWTH   Final  Report Status 04/13/2013 FINAL   Final  MRSA PCR SCREENING     Status: None   Collection Time    04/11/13  4:39 PM      Result Value Range Status   MRSA by PCR NEGATIVE  NEGATIVE Final   Comment:            The GeneXpert MRSA Assay (FDA     approved for NASAL specimens     only), is one component of a     comprehensive MRSA colonization     surveillance program. It is not     intended to diagnose MRSA     infection nor to guide or     monitor treatment for     MRSA infections.  RESPIRATORY VIRUS PANEL     Status: None   Collection Time    04/11/13  4:52 PM      Result Value Range Status   Source - RVPAN NASAL WASHINGS   Corrected   Comment: CORRECTED ON 04/21 AT 2314: PREVIOUSLY REPORTED AS NASAL WASHINGS   Respiratory Syncytial Virus A NOT DETECTED   Final   Respiratory Syncytial Virus B NOT DETECTED   Final   Influenza A NOT DETECTED   Final   Influenza B NOT DETECTED   Final   Parainfluenza 1 NOT DETECTED   Final   Parainfluenza 2 NOT DETECTED   Final   Parainfluenza 3 NOT DETECTED   Final   Metapneumovirus NOT DETECTED    Final   Rhinovirus NOT DETECTED   Final   Adenovirus NOT DETECTED   Final   Influenza A H1 NOT DETECTED   Final   Influenza A H3 NOT DETECTED   Final   Comment: (NOTE)           Normal Reference Range for each Analyte: NOT DETECTED     Testing performed using the Luminex xTAG Respiratory Viral Panel test     kit.     This test was developed and its performance characteristics determined     by [employer]. It has not been cleared or approved by the [place]     Food and Drug Administration. This test is used for clinical purposes.     It should not be regarded as investigational or for research. This     laboratory is certified under the [place] (CLIA) as qualified to perform high complexity     clinical laboratory testing.  FUNGUS CULTURE W SMEAR     Status: None   Collection Time    04/12/13  3:50 PM      Result Value Range Status   Specimen Description TRACHEAL ASPIRATE   Final   Special Requests Immunocompromised   Final   Fungal Smear NO YEAST OR FUNGAL ELEMENTS SEEN   Final   Culture CULTURE IN PROGRESS FOR FOUR WEEKS   Final   Report Status PENDING   Incomplete  AFB CULTURE WITH SMEAR     Status: None   Collection Time    04/12/13  3:51 PM      Result Value Range Status   Specimen Description TRACHEAL ASPIRATE   Final   Special Requests Immunocompromised   Final   ACID FAST SMEAR NO ACID FAST BACILLI SEEN   Final   Culture     Final   Value: CULTURE WILL BE EXAMINED FOR 6 WEEKS BEFORE ISSUING A FINAL REPORT   Report Status PENDING  Incomplete    Medical History: Past Medical History  Diagnosis Date  . Diabetes mellitus without complication   . Cancer     Prostate cancer  . Renal failure   . Detached retina   . Hypertension   . Cellulitis and abscess of unspecified site 2014    lower back  . ADHD (attention deficit hyperactivity disorder)   . Asthma   . Cellulitis and abscess of trunk 2014    history of back  abscess  . Elevated troponin   . Shortness of breath    Assessment: 62 year old male who is a diabetic, kidney transplant patient of many years who presented to the ED with a complaint of difficulty breathing and chest pain. He was started on broad spectrum abx. His BAl grew out Haemophilus parainfluenzae. Back abscess has peptostreptococcus. ID now change abx to cefepime/flagyl.   Plan:   Cefepime 2g IV q24 F/u with plan

## 2013-04-15 DIAGNOSIS — A492 Hemophilus influenzae infection, unspecified site: Secondary | ICD-10-CM

## 2013-04-15 LAB — COMPREHENSIVE METABOLIC PANEL
AST: 9 U/L (ref 0–37)
Albumin: 3 g/dL — ABNORMAL LOW (ref 3.5–5.2)
Alkaline Phosphatase: 96 U/L (ref 39–117)
Chloride: 103 mEq/L (ref 96–112)
Creatinine, Ser: 2.08 mg/dL — ABNORMAL HIGH (ref 0.50–1.35)
Potassium: 4.3 mEq/L (ref 3.5–5.1)
Total Bilirubin: 0.3 mg/dL (ref 0.3–1.2)
Total Protein: 6.1 g/dL (ref 6.0–8.3)

## 2013-04-15 LAB — CBC
Platelets: 153 10*3/uL (ref 150–400)
RBC: 4.48 MIL/uL (ref 4.22–5.81)
RDW: 17 % — ABNORMAL HIGH (ref 11.5–15.5)
WBC: 8.6 10*3/uL (ref 4.0–10.5)

## 2013-04-15 LAB — PHOSPHORUS: Phosphorus: 5 mg/dL — ABNORMAL HIGH (ref 2.3–4.6)

## 2013-04-15 MED ORDER — AMOXICILLIN-POT CLAVULANATE 875-125 MG PO TABS
1.0000 | ORAL_TABLET | Freq: Two times a day (BID) | ORAL | Status: AC
  Administered 2013-04-15 – 2013-04-18 (×8): 1 via ORAL
  Filled 2013-04-15 (×8): qty 1

## 2013-04-15 NOTE — Progress Notes (Signed)
PULMONARY  / CRITICAL CARE MEDICINE  Name: Russell Bowers MRN: [id number] DOB: Jul 09, 1951    ADMISSION DATE:  04/11/2013   BRIEF PATIENT DESCRIPTION:  73 male with hx of renal transplant 11/2011, intubated by PCCM in ED for acute on chronic hypercarbic respiratory failure after 1-2 months of cough, atypical chest pain and marked increase in DOE. Bilateral pulmonary infiltrates on CXR. Very similar admission in Feb 2013 @ [facility].   SIGNIFICANT EVENTS / STUDIES:  4/20 admitted with hypercarbic respiratory failure, pulmonary infiltrates, acute on chronic renal insuff, RBBB 4/22 extubated  LINES / TUBES: ETT 4/20 >> 4/22 R IJ CVL 4/20 >>4/23   CULTURES: MRSA PCR 4/20 >> NEG Strep Ag 4/20 >> NEG Legionella Ag 4/20 >>Negative  Streptococcal Urine Ag 4/20 >> negative   Urine 4/20 >> neg  Resp virus panel 4/20 >> negative  PJP DFA 4/20 >> negative  BAL cx 4/20 >> H. Influenzae, beta lactam neg Fungal respiratory cx 4/21 >> AFB resp. cx 4/21 >> AFB smear - negative  Blood 4/20 >>    ANTIBIOTICS: ABX per ID  Vanc 4/20 >> 4/23 Ceftaz 4/20 >>  Levoflox 4/20 >> 4/21  Flagyl 4/21>>   SUBJECTIVE:  Feeling great.  Sitting in chair.  Denies SOB. Wants to go home tomorrow.    VITAL SIGNS: Temp:  [97.8 F (36.6 C)-98.7 F (37.1 C)] 98.4 F (36.9 C) (04/24 [id number]) Pulse Rate:  [86-120] 86 (04/24 0840) Resp:  [18-26] 19 (04/24 0614) BP: (92-112)/(43-70) 101/58 mmHg (04/24 0840) SpO2:  [95 %-100 %] 98 % (04/24 0614) Weight:  [220 lb 14.4 oz (100.2 kg)] 220 lb 14.4 oz (100.2 kg) (04/24 0614) 2L [place]    INTAKE / OUTPUT: Intake/Output     04/23 0701 - 04/24 0700 04/24 0701 - 04/25 0700   P.O. 960    I.V. (mL/kg) 90.1 (0.9)    NG/GT     IV Piggyback 50    Total Intake(mL/kg) 1100.1 (11)    Urine (mL/kg/hr) 450 (0.2)    Stool     Total Output 450     Net +650.1          Urine Occurrence 2 x    Stool Occurrence 1 x      PHYSICAL EXAMINATION: General: Appears comfortable.  Fully alert and in no distress. OOB in chair.  Neuro:  Awake, alert, appropriate, MAE HEENT:  Center/AT, PERRL, EOMI Cardiovascular:  RRR, no murmurs  Lungs: resps even non labored on [place], essentially clear Abdomen:  Mod obese, soft, NT, NABS Back: Abscess site look clean with a small area of induration with erythema.  EXT: warm, no edema Skin:  No other lesions  LABS:  Recent Labs Lab 04/13/13 0500 04/14/13 0435 04/15/13 0526  HGB 12.2* 12.1* 12.5*  HCT 38.5* 40.0 41.7  WBC 11.0* 8.5 8.6  PLT 152 145* 153    Recent Labs Lab 04/11/13 2216 04/12/13 0400 04/13/13 0500 04/14/13 0435 04/15/13 0526  NA 139 142 145 144 144  K 3.8 3.5 3.3* 3.7 4.3  CL 98 100 102 103 103  CO2 33* 30 35* 35* 32  GLUCOSE 150* 73 208* 179* 171*  BUN 58* 56* 63* 54* 44*  CREATININE 2.66* 2.68* 2.29* 2.17* 2.08*  CALCIUM 9.3 9.6 9.7 9.6 9.9  MG  --  1.9  --  1.9  --   PHOS 1.9* 1.9* 2.7  --  5.0*    Recent Labs Lab 04/11/13 1155 04/11/13 2216 04/12/13 0400 04/13/13 0500 04/15/13  0526  AST  --  37 26 12 9   ALT  --  68* 61* 37 20  ALKPHOS  --  113 111 99 96  BILITOT  --  0.5 0.6 0.6 0.3  PROT  --  5.5* 5.6* 5.6* 6.1  ALBUMIN  --  3.0*  3.0* 3.1* 2.9* 3.0*  INR 1.16  --  1.23  --   --      Recent Labs Lab 04/14/13 0705 04/14/13 1113 04/14/13 1652 04/14/13 2137 04/15/13 0611  GLUCAP 184* 239* 188* 223* 156*    CXR:  No new CXR 4/24  ASSESSMENT / PLAN: Principal Problem:   Acute respiratory failure with hypercapnia Active Problems:   Pulmonary infiltrates   RBBB   History of renal transplantation   Acute on chronic renal failure   Immunocompromised   DM2 (diabetes mellitus, type 2)   Encephalopathy acute   Acute diastolic CHF (congestive heart failure)   Hypokalemia   Demand ischemia of myocardium   Protein-calorie malnutrition, severe   Pneumonia due to Hemophilus influenzae (H. influenzae)   PULMONARY A: Acute hypercarbic resp failure -- Resolved  HCAP-- H flu  (B lactam NEG) Pulmonary infiltrates - edema vs infection  P:   -extubated 4/22  -wean O2 as tol - would like him to be off O2 prior to d/c but he has had to go home on O2 after previous admissions -abx per ID - see below  -f/u CXR in am    CARDIOVASCULAR A: Elevated BNP 16846 ( with Renal insufficiency) RBBB - unclear whether old or new Possible demand ischemia, with transient elevation in troponins-- ECG c/w acute ischemia with rising trop levels. Echo with no WMA, EF 60-65% Tachycardia and Nonsustained PVC  4/24 - transient low BP. RN advised to hold lopressor in AM but gie lasix and monitor  P:  - cont Metoprolol PO  -Dr Tamala Julian to see 4/24 - Amiodarone gtt 4/23>> transition to po amiodarone   RENAL A:  Hx of renal transplant Acute on chronic renal insuff.  Hypokalemia - Improved.  P:   Renal following  Monitor BMET -- Scr trending down 4/24 Cont lasix per renal    GASTROINTESTINAL A:  No issues P:   Advance diet   HEMATOLOGIC A:  No issues P:  Monitor CBC intermittently  INFECTIOUS A:  Immunosuppressed Pulmonary infiltrates,d/t H Parainfluenza PNA.   Hx of back abscess with Peptostreptococcus.   P:   Micro and abx as above Fungal and AFB culture are pending  STaff comment: DC  ceftaz, flagyl  And r change to renally dosed augmentin PO per ID recs (needs total 7 days abx) Will f/u with surgery as outpt for ?further debridement   ENDOCRINE A:  DM2 Poor glycemic control P:   Cont lantus, SSI CHO modified diet  NEUROLOGIC A:  Acute enceph due to hypercarbia>>now resolved P:   Mobilize    Physical Deconditioning A: He looks great P  - get PT consult   WHITEHEART,KATHRYN, NP 04/15/2013  9:32 AM Pager: [phone] or [phone]   STAFF NOTE: I, Dr Ann Lions have personally reviewed patient's available data, including medical history, events of note, physical examination and test results as part of my evaluation. I have discussed with  resident/NP and other care providers such as pharmacist, RN and RRT.  In addition,  I personally evaluated patient and elicited key findings of acute septic illnes. Will change to oral abx after d/w ID Dr Wendie Agreste. Rest per  above  Rest per NP/medical resident whose note is outlined above and that I agree with      Dr. Brand Males, M.D., [facility].C.P Pulmonary and Critical Care Medicine Staff Physician [facility] Pager: [phone], If no answer or between  15:00h - 7:00h: call [phone]  04/15/2013 10:19 AM

## 2013-04-15 NOTE — Progress Notes (Signed)
NUTRITION FOLLOW UP  Intervention:    No nutrition intervention warranted at this time  RD to continue to follow  Nutrition Dx:   Inadequate oral intake, resolved  New Goal:   Oral intake with meals to meet >/= 90% of estimated nutrition needs, met  Monitor:   PO intake, weight, labs, I/O's  Assessment:   Patient extubated 4/22.  Diet advanced to Regular post-extubation.  PO intake 100% per flowsheet records.  Patient with recurrent burning chest discomfort; for myocardial perfusion study.  Height: Ht Readings from Last 1 Encounters:  04/14/13 5\' 8"  (1.727 m)    Weight Status:   Wt Readings from Last 1 Encounters:  04/15/13 220 lb 14.4 oz (100.2 kg)    Re-estimated needs:  Kcal: 1800-2000 Protein: 90-100 gm Fluid: 1.8-2.0 L  Skin: unstageable sacral wound present on admission-abscess that has been drained per RN  Diet Order: Carb Control   Intake/Output Summary (Last 24 hours) at 04/15/13 1004 Last data filed at 04/15/13 0522  Gross per 24 hour  Intake   1010 ml  Output    450 ml  Net    560 ml    Last BM: 4/23  Labs:   Recent Labs Lab 04/11/13 2216 04/12/13 0400 04/13/13 0500 04/14/13 0435 04/15/13 0526  NA 139 142 145 144 144  K 3.8 3.5 3.3* 3.7 4.3  CL 98 100 102 103 103  CO2 33* 30 35* 35* 32  BUN 58* 56* 63* 54* 44*  CREATININE 2.66* 2.68* 2.29* 2.17* 2.08*  CALCIUM 9.3 9.6 9.7 9.6 9.9  MG  --  1.9  --  1.9  --   PHOS 1.9* 1.9* 2.7  --  5.0*  GLUCOSE 150* 73 208* 179* 171*    CBG (last 3)   Recent Labs  04/14/13 1652 04/14/13 2137 04/15/13 0611  GLUCAP 188* 223* 156*    Scheduled Meds: . aspirin EC  325 mg Oral Daily  . ceFEPime (MAXIPIME) IV  2 g Intravenous Q24H  . furosemide  80 mg Oral BID  . heparin subcutaneous  5,000 Units Subcutaneous Q8H  . insulin aspart  0-15 Units Subcutaneous TID AC & HS  . insulin glargine  8 Units Subcutaneous QHS  . metoprolol tartrate  75 mg Oral BID  . metronidazole  500 mg Intravenous  Q8H  . mycophenolate  360 mg Oral BID  . pantoprazole  40 mg Oral QHS  . tacrolimus  3 mg Oral BID    Continuous Infusions:   Arthur Holms, RD, LDN Pager #: [phone] After-Hours Pager #: [phone]

## 2013-04-15 NOTE — Progress Notes (Signed)
Patient Name: Russell Bowers Date of Encounter: 04/15/2013    SUBJECTIVE: Spoke with Dr. Jimmy Footman earlier this morning. He is concerned about the possibility of coronary disease. The patient has indeed had episodes of nocturnal burning/tightness in the chest awakening him from sleep. This is occurred over the past 2 months. He has never before had this type discomfort.  TELEMETRY:  Normal sinus rhythm Filed Vitals:   04/15/13 0500 04/15/13 0614 04/15/13 0805 04/15/13 0840  BP:  107/54 92/43 101/58  Pulse:  86 110 86  Temp:  98.4 F (36.9 C)    TempSrc:  Oral    Resp:  19    Height:      Weight: 100.2 kg (220 lb 14.4 oz) 100.2 kg (220 lb 14.4 oz)    SpO2:  98%      Intake/Output Summary (Last 24 hours) at 04/15/13 0951 Last data filed at 04/15/13 0522  Gross per 24 hour  Intake 1026.7 ml  Output    450 ml  Net  576.7 ml    LABS: Basic Metabolic Panel:  Recent Labs  04/13/13 0500 04/14/13 0435 04/15/13 0526  NA 145 144 144  K 3.3* 3.7 4.3  CL 102 103 103  CO2 35* 35* 32  GLUCOSE 208* 179* 171*  BUN 63* 54* 44*  CREATININE 2.29* 2.17* 2.08*  CALCIUM 9.7 9.6 9.9  MG  --  1.9  --   PHOS 2.7  --  5.0*   CBC:  Recent Labs  04/14/13 0435 04/15/13 0526  WBC 8.5 8.6  HGB 12.1* 12.5*  HCT 40.0 41.7  MCV 92.6 93.1  PLT 145* 153   Cardiac Enzymes:  Recent Labs  04/12/13 1312 04/12/13 1653 04/12/13 2105 04/12/13 2240  CKTOTAL  --  23  --  19  CKMB  --  1.9  --  1.5  TROPONINI 0.79*  --  0.48*  --    BNP: No components found with this basename: POCBNP,  Hemoglobin A1C:  Recent Labs  04/12/13 2209  HGBA1C 7.0*   ECHO: Study Conclusions  - Left ventricle: The cavity size was normal. There was mild focal basal hypertrophy of the septum. Systolic function was normal. The estimated ejection fraction was in the range of 60% to 65%. Although no diagnostic regional wall motion abnormality was identified, this possibility cannot be completely  excluded on the basis of this study. - Aortic valve: There was mild stenosis. Trivial regurgitation. Valve area: 2.32cm^2(VTI). Valve area: 1.54cm^2 (Vmax). - Mitral valve: Calcified annulus. Mildly calcified leaflets . Mild regurgitation.   Radiology/Studies:   CHEST - 2 VIEW 04/05/13 Comparison: 10/01/2011.  Findings: Trachea is midline. Heart size is grossly stable. Lungs  are low in volume with central pulmonary vascular congestion and  scattered linear densities at the lung bases. No pleural fluid.  IMPRESSION:  Very low lung volumes with central pulmonary vascular congestion  and bibasilar subsegmental atelectasis.   Physical Exam: Blood pressure 101/58, pulse 86, temperature 98.4 F (36.9 C), temperature source Oral, resp. rate 19, height 5\' 8"  (1.727 m), weight 100.2 kg (220 lb 14.4 oz), SpO2 98.00%. Weight change: 0.8 kg (1 lb 12.2 oz)   Decreased breath sounds  Cardiac exam reveals a regular rhythm. No murmur.  Extremities reveal no edema.  ASSESSMENT:   1. Recurrent burning chest discomfort, nocturnal, uncertain etiology  2. Recent presentation with respiratory distress and infiltrates on chest x-ray  3. Prior kidney transplant with chronic elevation in creatinine  Plan:  1. Myocardial  perfusion study in a.m.  Demetrios Isaacs 04/15/2013, 9:51 AM

## 2013-04-15 NOTE — Progress Notes (Signed)
Subjective: Interval History: has complaints concern of heart.  Objective: Vital signs in last 24 hours: Temp:  [97.8 F (36.6 C)-98.7 F (37.1 C)] 98.4 F (36.9 C) (04/24 0614) Pulse Rate:  [86-124] 86 (04/24 0840) Resp:  [18-26] 19 (04/24 0614) BP: (92-112)/(43-70) 101/58 mmHg (04/24 0840) SpO2:  [93 %-100 %] 98 % (04/24 0614) Weight:  [100.2 kg (220 lb 14.4 oz)] 100.2 kg (220 lb 14.4 oz) (04/24 0614) Weight change: 0.8 kg (1 lb 12.2 oz)  Intake/Output from previous day: 04/23 0701 - 04/24 0700 In: 1100.1 [P.O.:960; I.V.:90.1; IV Piggyback:50] Out: 450 [Urine:450] Intake/Output this shift:    General appearance: alert, cooperative and moderately obese Resp: diminished breath sounds bilaterally and rales bibasilar Cardio: regular rate and rhythm and systolic murmur: systolic ejection 2/6, decrescendo at 2nd left intercostal space GI: pos bs,liver down 4 cm, TX RLQ Extremities: edema 1+  Lab Results:  Recent Labs  04/14/13 0435 04/15/13 0526  WBC 8.5 8.6  HGB 12.1* 12.5*  HCT 40.0 41.7  PLT 145* 153   BMET:  Recent Labs  04/14/13 0435 04/15/13 0526  NA 144 144  K 3.7 4.3  CL 103 103  CO2 35* 32  GLUCOSE 179* 171*  BUN 54* 44*  CREATININE 2.17* 2.08*  CALCIUM 9.6 9.9   No results found for this basename: PTH,  in the last 72 hours Iron Studies: No results found for this basename: IRON, TIBC, TRANSFERRIN, FERRITIN,  in the last 72 hours  Studies/Results: Dg Chest Port 1 View  04/14/2013  *RADIOLOGY REPORT*  Clinical Data: Extubation, shortness of breath  PORTABLE CHEST - 1 VIEW  Comparison: Portable exam 0501 hours compared to 04/13/2013  Findings: Tip of right jugular line stable projecting over high right atrium. Enlargement of cardiac silhouette with vascular congestion. Bibasilar atelectasis versus consolidation. Associated left pleural effusion. No pneumothorax or acute osseous findings.  IMPRESSION: Persistent bibasilar opacities and left pleural  effusion. Enlargement of cardiac silhouette with pulmonary vascular congestion.   Original Report Authenticated By: Lavonia Dana, M.D.     I have reviewed the patient's current medications.  Assessment/Plan: 1 Renal Tx stable 2 CHF/SVT suspect ischemia may have triggered whole process and he is high risk,, Needs coronary eval,discussed with Dr. Tamala Julian.  Discussed risks with him and wife. 3 ? H paraflu ?? Pathogenic 4 DM controlled 5 ^ lipids P cont lasix, caution with B Blocker.    LOS: 4 days   Russell Bowers L 04/15/2013,8:53 AM

## 2013-04-15 NOTE — Progress Notes (Signed)
04/15/2013 1743 Nursing note  Pt. HR noted to be elevated Sinus Tach with rate of 130-140. RN went to patient's room. Pt. Walking in room, did not have oxygen on. Oxygen immediately replaced. Pt. 02 saturation checked 93 % on 3 L o2 [name]. Pt. Stated he had just returned from bathroom, denies SOB or dizziness. Pt. Assisted back to chair. HR still elevated 133 Sinus Tach. With rest in chair HR decreased to 122.  Dr. Oliver Pila contacted and made aware. No orders received at this time. Will continue to closely monitor patient. Pt. Encouraged to call for assistance when needing to ambulate in room, demonstrated to patient that oxygen tubing was long enough to take to bathroom with patient. Pt. States he will call for assistance with any further needs within room. Upon reassessment at 1820, pt. Resting comfortably HR ranging from 114-124 Sinus Tach. Will continue to closely monitor patient.  [name], [name]

## 2013-04-15 NOTE — Progress Notes (Signed)
ANTIBIOTIC CONSULT NOTE  Pharmacy Consult for augmentin Indication: PNA/back abscess  No Known Allergies  Patient Measurements: Height: 5\' 8"  (172.7 cm) Weight: 220 lb 14.4 oz (100.2 kg) IBW/kg (Calculated) : 68.4 Weight = 100.2 kg  Labs:  Recent Labs  04/13/13 0500 04/14/13 0435 04/15/13 0526  WBC 11.0* 8.5 8.6  HGB 12.2* 12.1* 12.5*  PLT 152 145* 153  CREATININE 2.29* 2.17* 2.08*   Estimated Creatinine Clearance: 42.8 ml/min (by C-G formula based on Cr of 2.08). No results found for this basename: VANCOTROUGH, Corlis Leak, VANCORANDOM, GENTTROUGH, GENTPEAK, GENTRANDOM, TOBRATROUGH, TOBRAPEAK, TOBRARND, AMIKACINPEAK, AMIKACINTROU, AMIKACIN,  in the last 72 hours   Microbiology: Recent Results (from the past 720 hour(s))  CULTURE, BLOOD (ROUTINE X 2)     Status: None   Collection Time    04/11/13 12:30 PM      Result Value Range Status   Specimen Description BLOOD RIGHT HAND   Final   Special Requests BOTTLES DRAWN AEROBIC AND ANAEROBIC 10CC   Final   Culture  Setup Time 04/11/2013 17:11   Final   Culture     Final   Value:        BLOOD CULTURE RECEIVED NO GROWTH TO DATE CULTURE WILL BE HELD FOR 5 DAYS BEFORE ISSUING A FINAL NEGATIVE REPORT   Report Status PENDING   Incomplete  CULTURE, BLOOD (ROUTINE X 2)     Status: None   Collection Time    04/11/13 12:40 PM      Result Value Range Status   Specimen Description BLOOD RIGHT ARM   Final   Special Requests BOTTLES DRAWN AEROBIC ONLY 10CC   Final   Culture  Setup Time 04/11/2013 17:11   Final   Culture     Final   Value:        BLOOD CULTURE RECEIVED NO GROWTH TO DATE CULTURE WILL BE HELD FOR 5 DAYS BEFORE ISSUING A FINAL NEGATIVE REPORT   Report Status PENDING   Incomplete  PNEUMOCYSTIS JIROVECI SMEAR BY DFA     Status: None   Collection Time    04/11/13  3:49 PM      Result Value Range Status   Specimen Source-PJSRC BRONCHIAL ALVEOLAR LAVAGE   Final   Pneumocystis jiroveci Ag NEGATIVE   Final   Comment:  Performed at [place], BAL-QUANTITATIVE     Status: None   Collection Time    04/11/13  3:49 PM      Result Value Range Status   Specimen Description BRONCHIAL ALVEOLAR LAVAGE   Final   Special Requests NONE   Final   Gram Stain     Final   Value: MODERATE WBC PRESENT,BOTH PMN AND MONONUCLEAR     RARE SQUAMOUS EPITHELIAL CELLS PRESENT     NO ORGANISMS SEEN   Colony Count 20,OOO COLONIES/ML   Final   Culture     Final   Value: HAEMOPHILUS PARAINFLUENZAE     Note: BETA LACTAMASE NEGATIVE   Report Status 04/13/2013 FINAL   Final  URINE CULTURE     Status: None   Collection Time    04/11/13  3:51 PM      Result Value Range Status   Specimen Description URINE, CATHETERIZED   Final   Special Requests NONE   Final   Culture  Setup Time 04/11/2013 21:43   Final   Colony Count NO GROWTH   Final   Culture NO GROWTH   Final   Report  Status 04/13/2013 FINAL   Final  MRSA PCR SCREENING     Status: None   Collection Time    04/11/13  4:39 PM      Result Value Range Status   MRSA by PCR NEGATIVE  NEGATIVE Final   Comment:            The GeneXpert MRSA Assay (FDA     approved for NASAL specimens     only), is one component of a     comprehensive MRSA colonization     surveillance program. It is not     intended to diagnose MRSA     infection nor to guide or     monitor treatment for     MRSA infections.  RESPIRATORY VIRUS PANEL     Status: None   Collection Time    04/11/13  4:52 PM      Result Value Range Status   Source - RVPAN NASAL WASHINGS   Corrected   Comment: CORRECTED ON 04/21 AT 2314: PREVIOUSLY REPORTED AS NASAL WASHINGS   Respiratory Syncytial Virus A NOT DETECTED   Final   Respiratory Syncytial Virus B NOT DETECTED   Final   Influenza A NOT DETECTED   Final   Influenza B NOT DETECTED   Final   Parainfluenza 1 NOT DETECTED   Final   Parainfluenza 2 NOT DETECTED   Final   Parainfluenza 3 NOT DETECTED   Final   Metapneumovirus NOT DETECTED    Final   Rhinovirus NOT DETECTED   Final   Adenovirus NOT DETECTED   Final   Influenza A H1 NOT DETECTED   Final   Influenza A H3 NOT DETECTED   Final   Comment: (NOTE)           Normal Reference Range for each Analyte: NOT DETECTED     Testing performed using the Luminex xTAG Respiratory Viral Panel test     kit.     This test was developed and its performance characteristics determined     by [employer]. It has not been cleared or approved by the [place]     Food and Drug Administration. This test is used for clinical purposes.     It should not be regarded as investigational or for research. This     laboratory is certified under the [place] (CLIA) as qualified to perform high complexity     clinical laboratory testing.  FUNGUS CULTURE W SMEAR     Status: None   Collection Time    04/12/13  3:50 PM      Result Value Range Status   Specimen Description TRACHEAL ASPIRATE   Final   Special Requests Immunocompromised   Final   Fungal Smear NO YEAST OR FUNGAL ELEMENTS SEEN   Final   Culture CULTURE IN PROGRESS FOR FOUR WEEKS   Final   Report Status PENDING   Incomplete  AFB CULTURE WITH SMEAR     Status: None   Collection Time    04/12/13  3:51 PM      Result Value Range Status   Specimen Description TRACHEAL ASPIRATE   Final   Special Requests Immunocompromised   Final   ACID FAST SMEAR NO ACID FAST BACILLI SEEN   Final   Culture     Final   Value: CULTURE WILL BE EXAMINED FOR 6 WEEKS BEFORE ISSUING A FINAL REPORT   Report Status PENDING  Incomplete    Medical History: Past Medical History  Diagnosis Date  . Diabetes mellitus without complication   . Cancer     Prostate cancer  . Renal failure   . Detached retina   . Hypertension   . Cellulitis and abscess of unspecified site 2014    lower back  . ADHD (attention deficit hyperactivity disorder)   . Asthma   . Cellulitis and abscess of trunk 2014    history of back  abscess  . Elevated troponin   . Shortness of breath    Assessment: 62 year old male who is a diabetic, kidney transplant patient of many years who presented to the ED with a complaint of difficulty breathing and chest pain. He was started on broad spectrum abx. His BAL grew out Haemophilus parainfluenzae. Back abscess has peptostreptococcus.  After discussion with ID, abx changed to augmentin PO to complete 7 total days abx.  Abx started 4/20.  Plan:  - Augmentin 875 mg po q12h - f/u renal fxn  Khloi Rawl L. Amada Jupiter, PharmD, [place] Clinical Pharmacist Pager: [phone] Pharmacy: [phone] 04/15/2013 10:37 AM

## 2013-04-15 NOTE — Progress Notes (Signed)
Occupational Therapy Evaluation Patient Details Name: Russell Bowers MRN: [id number] DOB: Jun 01, 1951 Today's Date: 04/15/2013 Time: [id number] OT Time Calculation (min): 20 min  OT Assessment / Plan / Recommendation Clinical Impression   4 male with hx of renal transplant 11/2011, intubated by PCCM in ED for acute on chronic hypercarbic respiratory failure after 1-2 months of cough, atypical chest pain and marked increase in DOE. Bilateral pulmonary infiltrates on CXR. Very similar admission in Feb 2013 @ [facility]. On entry to room, pt on RA. O2 SATS 94. Pt ambulated @ 150 ft. O2 decreased to @ 74. Returned to room. Pt put on 3L [place] and O2 increased to 96. Pt was asymptomatic throughout session. No c/o of SOB. No dyspnea. Pt ambulated again during functional activites on 3L O2 and O2 SATS remained >93. Pt is independent with ADL and mobility for ADL. No balance deficits noted during eval. No further OT needs; however, pt will need to d/C on home O2 at this time due to significant desat with any activity. Wife can provide intermittent S.  Nursing aware of desat. Nsg states that pt had similar episode yesterday.      OT Assessment  Patient does not need any further OT services    Follow Up Recommendations  No OT follow up    Barriers to Discharge  none    Equipment Recommendations  Tub/shower seat    Recommendations for Other Services  none  Frequency    eval only   Precautions / Restrictions Precautions Precautions: Other (comment) (desat) Restrictions Weight Bearing Restrictions: No   Pertinent Vitals/Pain O2 desat with activity dropped to 74 on RA. Increased to 96 3L    ADL  ADL Comments: Pt overall inidependent with all ADL. Primary concern is desat with activity    OT Diagnosis:    OT Problem List:   OT Treatment Interventions:     OT Goals Acute Rehab OT Goals OT Goal Formulation:  (eval only)  Visit Information  Last OT Received On: 04/15/13 Assistance Needed: +1     Subjective Data      Prior Functioning     Home Living Lives With: Spouse Available Help at Discharge: Family;Available PRN/intermittently Type of Home: House Home Access: Stairs to enter [employer] of Steps: 3 Entrance Stairs-Rails: Can reach both Home Layout: One level Bathroom Shower/Tub: Multimedia programmer: Standard Bathroom Accessibility: Yes How Accessible: Accessible via walker Home Adaptive Equipment: None Prior Function Level of Independence: Independent Able to Take Stairs?: Yes Driving: Yes Vocation: Retired         Education administrator - History Baseline Vision: Wears glasses all the time   Cognition  Cognition Arousal/Alertness: Awake/alert Behavior During Therapy: WFL for tasks assessed/performed Overall Cognitive Status: Within Functional Limits for tasks assessed    Extremity/Trunk Assessment Right Upper Extremity Assessment RUE ROM/Strength/Tone: Within functional levels Left Upper Extremity Assessment LUE ROM/Strength/Tone: Within functional levels Right Lower Extremity Assessment RLE ROM/Strength/Tone: WFL for tasks assessed Left Lower Extremity Assessment LLE ROM/Strength/Tone: WFL for tasks assessed Trunk Assessment Trunk Assessment: Normal     Mobility Bed Mobility Bed Mobility: Supine to Sit Supine to Sit: 7: Independent Transfers Transfers: Sit to Stand;Stand to Sit Sit to Stand: 7: Independent Stand to Sit: 7: Independent     Exercise     Balance Balance Balance Assessed: Yes High Level Balance High Level Balance Activites: Direction changes;Turns;Sudden stops;Head turns High Level Balance Comments: no loss of balance noted   End of Session OT - End  of Session Equipment Utilized During Treatment: Gait belt Activity Tolerance: Patient tolerated treatment well Patient left: in chair;with call bell/phone within reach Nurse Communication: Mobility status;Other (comment) (concern over desat)   GO     Ragna Kramlich,HILLARY 04/15/2013, 5:05 PM [facility], OTR/L  [phone] 04/15/2013

## 2013-04-16 ENCOUNTER — Inpatient Hospital Stay (HOSPITAL_COMMUNITY): Payer: Medicare Other

## 2013-04-16 DIAGNOSIS — J14 Pneumonia due to Hemophilus influenzae: Secondary | ICD-10-CM

## 2013-04-16 DIAGNOSIS — I5031 Acute diastolic (congestive) heart failure: Secondary | ICD-10-CM

## 2013-04-16 LAB — GLUCOSE, CAPILLARY
Glucose-Capillary: 157 mg/dL — ABNORMAL HIGH (ref 70–99)
Glucose-Capillary: 130 mg/dL — ABNORMAL HIGH (ref 70–99)
Glucose-Capillary: 278 mg/dL — ABNORMAL HIGH (ref 70–99)

## 2013-04-16 LAB — RENAL FUNCTION PANEL
BUN: 43 mg/dL — ABNORMAL HIGH (ref 6–23)
CO2: 32 mEq/L (ref 19–32)
Chloride: 100 mEq/L (ref 96–112)
Creatinine, Ser: 2.16 mg/dL — ABNORMAL HIGH (ref 0.50–1.35)
GFR calc non Af Amer: 31 mL/min — ABNORMAL LOW (ref >90)

## 2013-04-16 LAB — CBC
HCT: 41.6 % (ref 39.0–52.0)
MCV: 93.3 fL (ref 78.0–100.0)
Platelets: 144 10*3/uL — ABNORMAL LOW (ref 150–400)
RBC: 4.46 MIL/uL (ref 4.22–5.81)
WBC: 9 10*3/uL (ref 4.0–10.5)

## 2013-04-16 MED ORDER — FUROSEMIDE 80 MG PO TABS
160.0000 mg | ORAL_TABLET | Freq: Two times a day (BID) | ORAL | Status: DC
  Administered 2013-04-16 – 2013-04-17 (×3): 160 mg via ORAL
  Filled 2013-04-16 (×5): qty 2

## 2013-04-16 MED ORDER — REGADENOSON 0.4 MG/5ML IV SOLN: 0.4000 mg | Freq: Once | INTRAVENOUS | Status: AC

## 2013-04-16 MED ORDER — TECHNETIUM TC 99M SESTAMIBI GENERIC - CARDIOLITE
30.0000 | Freq: Once | INTRAVENOUS | Status: AC | PRN
  Administered 2013-04-16: 30 via INTRAVENOUS

## 2013-04-16 MED ORDER — METOPROLOL TARTRATE 100 MG PO TABS
100.0000 mg | ORAL_TABLET | Freq: Two times a day (BID) | ORAL | Status: DC
  Filled 2013-04-16: qty 1

## 2013-04-16 MED ORDER — METOPROLOL TARTRATE 100 MG PO TABS
100.0000 mg | ORAL_TABLET | ORAL | Status: AC
  Administered 2013-04-16: 100 mg via ORAL
  Filled 2013-04-16: qty 1

## 2013-04-16 MED ORDER — FUROSEMIDE 80 MG PO TABS
160.0000 mg | ORAL_TABLET | Freq: Two times a day (BID) | ORAL | Status: DC
  Filled 2013-04-16 (×2): qty 2

## 2013-04-16 MED ORDER — TECHNETIUM TC 99M SESTAMIBI GENERIC - CARDIOLITE
10.0000 | Freq: Once | INTRAVENOUS | Status: AC | PRN
  Administered 2013-04-16: 10 via INTRAVENOUS

## 2013-04-16 MED ORDER — REGADENOSON 0.4 MG/5ML IV SOLN
INTRAVENOUS | Status: AC
  Administered 2013-04-16: 0.4 mg via INTRAVENOUS
  Filled 2013-04-16: qty 5

## 2013-04-16 MED ORDER — METOPROLOL TARTRATE 100 MG PO TABS
100.0000 mg | ORAL_TABLET | Freq: Two times a day (BID) | ORAL | Status: DC
  Administered 2013-04-16: 100 mg via ORAL
  Filled 2013-04-16 (×3): qty 1

## 2013-04-16 NOTE — Progress Notes (Signed)
SUBJECTIVE:  No palpitations.  He does have a productive cough.    OBJECTIVE:   Vitals:   Filed Vitals:   04/16/13 0452 04/16/13 1324 04/16/13 1327 04/16/13 1329  BP: 121/61 111/56 112/49 101/45  Pulse: 92     Temp: 98.1 F (36.7 C)     TempSrc: Oral     Resp: 18     Height:      Weight: 102 kg (224 lb 13.9 oz)     SpO2: 96%      I&O's:   Intake/Output Summary (Last 24 hours) at 04/16/13 1400 Last data filed at 04/16/13 0900  Gross per 24 hour  Intake    240 ml  Output    250 ml  Net    -10 ml   TELEMETRY: Reviewed telemetry pt in NSR, atrial tachycardia:     PHYSICAL EXAM General: Well developed, well nourished, in no acute distress Head:    Normal cephalic and atramatic  Lungs:   Bibasilar crackles Heart:  Tachycardic Abdomen: Obese Msk:   Normal strength and tone for age. Extremities:  1+ bilateral pitting edema.   Neuro: Alert and oriented X 3. Psych: Normal affect, responds appropriately   LABS: Basic Metabolic Panel:  Recent Labs  04/14/13 0435 04/15/13 0526 04/16/13 0518  NA 144 144 140  K 3.7 4.3 4.4  CL 103 103 100  CO2 35* 32 32  GLUCOSE 179* 171* 165*  BUN 54* 44* 43*  CREATININE 2.17* 2.08* 2.16*  CALCIUM 9.6 9.9 9.8  MG 1.9  --   --   PHOS  --  5.0* 4.6   Liver Function Tests:  Recent Labs  04/15/13 0526 04/16/13 0518  AST 9  --   ALT 20  --   ALKPHOS 96  --   BILITOT 0.3  --   PROT 6.1  --   ALBUMIN 3.0* 3.1*   No results found for this basename: LIPASE, AMYLASE,  in the last 72 hours CBC:  Recent Labs  04/15/13 0526 04/16/13 0518  WBC 8.6 9.0  HGB 12.5* 12.4*  HCT 41.7 41.6  MCV 93.1 93.3  PLT 153 144*   Cardiac Enzymes: No results found for this basename: CKTOTAL, CKMB, CKMBINDEX, TROPONINI,  in the last 72 hours BNP: No components found with this basename: POCBNP,  D-Dimer: No results found for this basename: DDIMER,  in the last 72 hours Hemoglobin A1C: No results found for this basename: HGBA1C,  in the  last 72 hours Fasting Lipid Panel: No results found for this basename: CHOL, HDL, LDLCALC, TRIG, CHOLHDL, LDLDIRECT,  in the last 72 hours Thyroid Function Tests:  Recent Labs  04/14/13 0800  TSH 0.656   Anemia Panel: No results found for this basename: VITAMINB12, FOLATE, FERRITIN, TIBC, IRON, RETICCTPCT,  in the last 72 hours Coag Panel:   Lab Results  Component Value Date   INR 1.23 04/12/2013   INR 1.16 04/11/2013   INR 1.01 03/11/2013    RADIOLOGY: Dg Chest 2 View  04/16/2013  *RADIOLOGY REPORT*  Clinical Data: Pulmonary infiltrates  CHEST - 2 VIEW  Comparison: Chest radiograph 04/14/2013  Findings: Removal of right central venous line.  Stable cardiac silhouette.  There are low lung volumes and bibasilar atelectasis. Small effusions similar to prior.  There is mild perihilar air space disease/interstitial edema.  No pneumothorax  IMPRESSION: 1.  No interval change. 2.  Low lung volumes and basilar atelectasis. 3.  Central venous congestion/interstitial edema/pulmonary edema not changed from prior.  Original Report Authenticated By: Suzy Bouchard, M.D.    Dg Chest 2 View  04/05/2013  *RADIOLOGY REPORT*  Clinical Data: Chest pain and cough.  History of renal transplant.  CHEST - 2 VIEW  Comparison: 10/01/2011.  Findings: Trachea is midline.  Heart size is grossly stable.  Lungs are low in volume with central pulmonary vascular congestion and scattered linear densities at the lung bases.  No pleural fluid.  IMPRESSION: Very low lung volumes with central pulmonary vascular congestion and bibasilar subsegmental atelectasis.   Original Report Authenticated By: Lorin Picket, M.D.    Dg Chest Port 1 View  04/14/2013  *RADIOLOGY REPORT*  Clinical Data: Extubation, shortness of breath  PORTABLE CHEST - 1 VIEW  Comparison: Portable exam 0501 hours compared to 04/13/2013  Findings: Tip of right jugular line stable projecting over high right atrium. Enlargement of cardiac silhouette with  vascular congestion. Bibasilar atelectasis versus consolidation. Associated left pleural effusion. No pneumothorax or acute osseous findings.  IMPRESSION: Persistent bibasilar opacities and left pleural effusion. Enlargement of cardiac silhouette with pulmonary vascular congestion.   Original Report Authenticated By: Lavonia Dana, M.D.    Dg Chest Port 1 View  04/13/2013  *RADIOLOGY REPORT*  Clinical Data: Pleural effusions and airspace disease.  Ventilated patient.  PORTABLE CHEST - 1 VIEW  Comparison: Chest x-ray 04/12/2013.  Findings: An endotracheal tube is in place with tip 1.7 cm above the carina. There is a right-sided internal jugular central venous catheter with tip terminating in the right atrium. Nasogastric tube is seen extending into either of the antral prepyloric region of the stomach or the proximal duodenum.  Lung volumes are extremely low, and there are extensive bibasilar opacities which may reflect areas of atelectasis and/or consolidation.  Small left pleural effusion.  Pulmonary venous congestion, accentuated by low lung volumes, without frank pulmonary edema.  Cardiac silhouette is largely obscured, but appears likely to be enlarged. The patient is rotated to the right on today's exam, resulting in distortion of the mediastinal contours and reduced diagnostic sensitivity and specificity for mediastinal pathology.  IMPRESSION: 1.  Support apparatus, as above. 2.  Severely decreased lung volumes with persistent bibasilar atelectasis and/or consolidation and small left pleural effusion.   Original Report Authenticated By: Vinnie Langton, M.D.    Dg Chest Port 1 View  04/12/2013  *RADIOLOGY REPORT*  Clinical Data: Respiratory failure.  Shortness of breath.  PORTABLE CHEST - 1 VIEW  Comparison: Chest 04/11/2013 and 04/05/2013  Findings: Support tubes and lines are unchanged.  There has been interval increase in bilateral pleural effusions and basilar airspace disease.  Pulmonary edema appears  unchanged.  IMPRESSION: Increased bilateral pleural effusions and basilar airspace disease. No marked change in pulmonary edema.   Original Report Authenticated By: Orlean Patten, M.D.    Dg Chest Portable 1 View  04/11/2013  *RADIOLOGY REPORT*  Clinical Data: Post intubation and central line placement.  PORTABLE CHEST - 1 VIEW  Comparison: 04/11/2013  Findings: Endotracheal tube is 3.4 cm above the carina.  Right central line tip is at the cavoatrial junction.  No pneumothorax. Low lung volumes with bilateral airspace opacities, likely edema. Mild cardiomegaly.  Suspect small effusions.  NG tube is in place within the stomach.  IMPRESSION: Support devices as above.  Continued bilateral airspace opacities, likely edema.  Low volumes with bibasilar atelectasis and small effusions.   Original Report Authenticated By: Rolm Baptise, M.D.    Dg Chest Port 1 View  04/11/2013  *RADIOLOGY REPORT*  Clinical Data:  Chest pain, shortness of breath.  PORTABLE CHEST - 1 VIEW  Comparison: 04/05/2013  Findings: Low lung volumes.  Cardiomegaly.  Vascular congestion and diffuse bilateral airspace opacities, likely edema.  Bibasilar atelectasis.  No visible effusions.  IMPRESSION: Low lung volumes with bibasilar atelectasis.  Mild CHF.   Original Report Authenticated By: Rolm Baptise, M.D.       ASSESSMENT: Atrial tachycardia; r/o CAD  PLAN:  Stress test today.  Increase metoprolol for Atrial tachycardia.  Diurese as tolerated.  Jettie Booze., MD  04/16/2013  2:00 PM

## 2013-04-16 NOTE — Evaluation (Signed)
Physical Therapy Evaluation Patient Details Name: Russell Bowers MRN: [id number] DOB: 11/22/1951 Today's Date: 04/16/2013 Time: [id number] PT Time Calculation (min): 27 min  PT Assessment / Plan / Recommendation Clinical Impression  Patient is a 62 yo male admitted with acute respiratory failure, was extubated 4/22, and has had sinus tachycardia.  Patient independent with mobility.  Has slight decrease in balance with high level balance activities.  Will benefit from acute PT to maximize independence/safety prior to discharge.  Do not anticipate any f/u PT needs at discharge.    PT Assessment  Patient needs continued PT services    Follow Up Recommendations  No PT follow up;Supervision - Intermittent    Does the patient have the potential to tolerate intense rehabilitation      Barriers to Discharge None      Equipment Recommendations  None recommended by PT    Recommendations for Other Services     Frequency Min 3X/week    Precautions / Restrictions Precautions Precaution Comments: Decreased O2 sat with mobility on O2 Restrictions Weight Bearing Restrictions: No   Pertinent Vitals/Pain O2 sats decreased to 87% during ambulation on O2 at 3 l/min.  Sats increased to 96% within 30 seconds of sitting rest break. HR was initially 118 (OK to amb per nursing).  HR increased to 126 with gait, and decreased to 121 during rest break.      Mobility  Bed Mobility Bed Mobility: Not assessed Transfers Transfers: Sit to Stand;Stand to Sit Sit to Stand: 7: Independent Stand to Sit: 7: Independent Details for Transfer Assistance: No cues or assist needed Ambulation/Gait Ambulation/Gait Assistance: 5: Supervision Ambulation Distance (Feet): 220 Feet Assistive device: None Ambulation/Gait Assistance Details:  Patient with good balance with gait on even surfaces without obstacles.  Patient with decrease in balance with high level balance activities such as turning head, going around  obstacles, stepping over obstacles, turns. Gait Pattern: Within Functional Limits Gait velocity: WFL    Exercises     PT Diagnosis: Abnormality of gait  PT Problem List: Decreased activity tolerance;Decreased balance;Cardiopulmonary status limiting activity PT Treatment Interventions: Gait training;Stair training;Balance training;Patient/family education   PT Goals Acute Rehab PT Goals PT Goal Formulation: With patient Time For Goal Achievement: 04/23/13 Potential to Achieve Goals: Good Pt will Ambulate: >150 feet;Independently (with no loss of balance) PT Goal: Ambulate - Progress: Goal set today Pt will Go Up / Down Stairs: 3-5 stairs;with modified independence;with rail(s) PT Goal: Up/Down Stairs - Progress: Goal set today Additional Goals Additional Goal #1: Patient will score > 19/24 on DGI balance assessment to indicate low fall risk. PT Goal: Additional Goal #1 - Progress: Goal set today  Visit Information  Last PT Received On: 04/16/13 Assistance Needed: +1    Subjective Data  Subjective: "I feel pretty steady on my feet" Patient Stated Goal: To go home   Prior [place] Lives With: Spouse Available Help at Discharge: Family;Available PRN/intermittently Type of Home: House Home Access: Stairs to enter [employer] of Steps: 3 Entrance Stairs-Rails: Can reach both Home Layout: One level Bathroom Shower/Tub: Multimedia programmer: Standard Bathroom Accessibility: Yes How Accessible: Accessible via walker Home Adaptive Equipment: None Prior Function Level of Independence: Independent Able to Take Stairs?: Yes Driving: Yes 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 Arousal/Alertness: Awake/alert Behavior During Therapy: WFL for tasks assessed/performed Overall Cognitive Status: Within Functional Limits for tasks assessed    Extremity/Trunk Assessment Right Upper Extremity  Assessment RUE  ROM/Strength/Tone: Within functional levels Left Upper Extremity Assessment LUE ROM/Strength/Tone: Within functional levels Right Lower Extremity Assessment RLE ROM/Strength/Tone: WFL for tasks assessed RLE Sensation: WFL - Light Touch Left Lower Extremity Assessment LLE ROM/Strength/Tone: WFL for tasks assessed LLE Sensation: WFL - Light Touch Trunk Assessment Trunk Assessment: Normal   Balance Balance Balance Assessed: Yes High Level Balance High Level Balance Activites: Direction changes;Turns;Sudden stops;Head turns (Stepping around and over obstacles) High Level Balance Comments: Staggering gait noted x3 with high level balance activities  End of Session PT - End of Session Equipment Utilized During Treatment: Gait belt;Oxygen Activity Tolerance: Patient limited by fatigue (O2 sat dropped to 87%) Patient left: in chair;with call bell/phone within reach;with family/visitor present Nurse Communication: Mobility status (O2 sat and HR changes)  GP     Despina Pole 04/16/2013, 6:26 PM Carita Pian. Sanjuana Kava, [place] Pager [phone]

## 2013-04-16 NOTE — Progress Notes (Signed)
04/16/2013 2:59 PM Nursing note Upon return from Nuclear medicine study. Pt. Hr noted to be sustaining 125-130 sinus tach. BP 117/69. Morning dose of Lopressor held pre nuclear medicine study per protocol. Dr. Irish Lack paged and verbal orders received to give increased Lopressor dose now. Orders enacted. Will continue to closely monitor patient.  Bentlie Withem, [name]

## 2013-04-16 NOTE — Progress Notes (Signed)
Subjective: Interval History: has complaints nervous,concerned of fluid.  Objective: Vital signs in last 24 hours: Temp:  [97.6 F (36.4 C)-98.1 F (36.7 C)] 98.1 F (36.7 C) (04/25 0452) Pulse Rate:  [92-123] 92 (04/25 0452) Resp:  [18-19] 18 (04/25 0452) BP: (94-136)/(54-70) 121/61 mmHg (04/25 0452) SpO2:  [74 %-99 %] 96 % (04/25 0452) Weight:  [102 kg (224 lb 13.9 oz)] 102 kg (224 lb 13.9 oz) (04/25 0452) Weight change: 1.8 kg (3 lb 15.5 oz)  Intake/Output from previous day: 04/24 0701 - 04/25 0700 In: 720 [P.O.:720] Out: 850 [Urine:850] Intake/Output this shift:    General appearance: alert, cooperative and anxious Resp: rales bibasilar Cardio: S1, S2 normal and systolic murmur: systolic ejection 2/6, decrescendo at 2nd left intercostal space GI: obese, pos bs,liver down 5 cm Extremities: edema 2+  Lab Results:  Recent Labs  04/15/13 0526 04/16/13 0518  WBC 8.6 9.0  HGB 12.5* 12.4*  HCT 41.7 41.6  PLT 153 144*   BMET:  Recent Labs  04/15/13 0526 04/16/13 0518  NA 144 140  K 4.3 4.4  CL 103 100  CO2 32 32  GLUCOSE 171* 165*  BUN 44* 43*  CREATININE 2.08* 2.16*  CALCIUM 9.9 9.8   No results found for this basename: PTH,  in the last 72 hours Iron Studies: No results found for this basename: IRON, TIBC, TRANSFERRIN, FERRITIN,  in the last 72 hours  Studies/Results: Dg Chest 2 View  04/16/2013  *RADIOLOGY REPORT*  Clinical Data: Pulmonary infiltrates  CHEST - 2 VIEW  Comparison: Chest radiograph 04/14/2013  Findings: Removal of right central venous line.  Stable cardiac silhouette.  There are low lung volumes and bibasilar atelectasis. Small effusions similar to prior.  There is mild perihilar air space disease/interstitial edema.  No pneumothorax  IMPRESSION: 1.  No interval change. 2.  Low lung volumes and basilar atelectasis. 3.  Central venous congestion/interstitial edema/pulmonary edema not changed from prior.   Original Report Authenticated By:  Suzy Bouchard, M.D.     I have reviewed the patient's current medications.  Assessment/Plan: 1 Renal Tx with CKD 3 . Vol xs yet and equilibrating, ^ Lasix 2 Cardiac per Dr Tamala Julian 3 Resp stable vol, ? Bronchitis 4 DM controlled 5 Prostate Ca P ^ Lasix, Stress test    LOS: 5 days   Jaclynne Baldo L 04/16/2013,8:46 AM

## 2013-04-16 NOTE — Care Management Note (Unsigned)
    Page 1 of 1   04/16/2013     4:24:17 PM   CARE MANAGEMENT NOTE 04/16/2013  Patient:  Russell Bowers, Russell Bowers   Account Number:  [id number]  Date Initiated:  04/12/2013  Documentation initiated by:  [facility]  Subjective/Objective Assessment:   h/o renal tx - admitted with DOE - resp failure - intubated.  Lives with spouse.     Action/Plan:   Anticipated DC Date:  04/19/2013   Anticipated DC Plan:  [place]  CM consult      Choice offered to / List presented to:             Status of service:  In process, will continue to follow Medicare Important Message given?   (If response is "NO", the following Medicare IM given date fields will be blank) Date Medicare IM given:   Date Additional Medicare IM given:    Discharge Disposition:    Per UR Regulation:  Reviewed for med. necessity/level of care/duration of stay  If discussed at [place] of Stay Meetings, dates discussed:    Comments:  ContactNauman, [place] [phone]   04/16/13 Armas Mcbee,RN,BSN [id number] NOTIFIED THAT PT WILL LIKELY NEED HOME O2, AS DESATS TO 76% ON RA.  WILL NEED ORDERS FOR HOME O2, AND DOCUMENTED RA SATS WITHIN 48H OF DC.  CM WILL FOLLOW.

## 2013-04-16 NOTE — Progress Notes (Signed)
PULMONARY  / CRITICAL CARE MEDICINE  Name: Russell Bowers MRN: [id number] DOB: 14-Jan-1951    ADMISSION DATE:  04/11/2013   BRIEF PATIENT DESCRIPTION:  53 male with hx of renal transplant 11/2011, intubated by PCCM in ED for acute on chronic hypercarbic respiratory failure after 1-2 months of cough, atypical chest pain and marked increase in DOE. Bilateral pulmonary infiltrates on CXR. Very similar admission in Feb 2013 @ [facility].   SIGNIFICANT EVENTS / STUDIES:  4/20 admitted with hypercarbic respiratory failure, pulmonary infiltrates, acute on chronic renal insuff, RBBB 4/22 extubated  LINES / TUBES: ETT 4/20 >> 4/22 R IJ CVL 4/20 >>4/23   CULTURES: MRSA PCR 4/20 >> NEG Strep Ag 4/20 >> NEG Legionella Ag 4/20 >>Negative  Streptococcal Urine Ag 4/20 >> negative   Urine 4/20 >> neg  Resp virus panel 4/20 >> negative  PJP DFA 4/20 >> negative  BAL cx 4/20 >> H. Influenzae, beta lactam neg Fungal respiratory cx 4/21 >> AFB resp. cx 4/21 >> AFB smear - negative  Blood 4/20 >> neg   ANTIBIOTICS: ABX per ID  Vanc 4/20 >> 4/23 Ceftaz 4/20 >>  Levoflox 4/20 >> 4/21  Flagyl 4/21>>4/23 augmentin (per ID) 4/24>>    SUBJECTIVE:  Feeling great.  Sitting in chair.  Denies SOB.  VITAL SIGNS: Temp:  [97.6 F (36.4 C)-98.1 F (36.7 C)] 98.1 F (36.7 C) (04/25 0452) Pulse Rate:  [92-123] 92 (04/25 0452) Resp:  [18-19] 18 (04/25 0452) BP: (94-136)/(54-70) 121/61 mmHg (04/25 0452) SpO2:  [74 %-99 %] 96 % (04/25 0452) Weight:  [102 kg (224 lb 13.9 oz)] 102 kg (224 lb 13.9 oz) (04/25 0452) 2L [place]    INTAKE / OUTPUT: Intake/Output     04/24 0701 - 04/25 0700 04/25 0701 - 04/26 0700   P.O. 720 0   I.V. (mL/kg)     IV Piggyback     Total Intake(mL/kg) 720 (7.1)    Urine (mL/kg/hr) 850 (0.3) 200 (0.5)   Total Output 850 200   Net -130 -200          PHYSICAL EXAMINATION: General: Appears comfortable. Fully alert and in no distress. OOB in chair.  Neuro:  Awake, alert,  appropriate, MAE HEENT:  [name]/AT, PERRL, EOMI Cardiovascular:  RRR, no murmurs  Lungs: resps even non labored on [place], essentially clear Abdomen:  Mod obese, soft, NT, NABS Back: Abscess site look clean with a small area of induration .  EXT: warm, no edema Skin:  No other lesions  LABS:  Recent Labs Lab 04/14/13 0435 04/15/13 0526 04/16/13 0518  HGB 12.1* 12.5* 12.4*  HCT 40.0 41.7 41.6  WBC 8.5 8.6 9.0  PLT 145* 153 144*    Recent Labs Lab 04/11/13 2216 04/12/13 0400 04/13/13 0500 04/14/13 0435 04/15/13 0526 04/16/13 0518  NA 139 142 145 144 144 140  K 3.8 3.5 3.3* 3.7 4.3 4.4  CL 98 100 102 103 103 100  CO2 33* 30 35* 35* 32 32  GLUCOSE 150* 73 208* 179* 171* 165*  BUN 58* 56* 63* 54* 44* 43*  CREATININE 2.66* 2.68* 2.29* 2.17* 2.08* 2.16*  CALCIUM 9.3 9.6 9.7 9.6 9.9 9.8  MG  --  1.9  --  1.9  --   --   PHOS 1.9* 1.9* 2.7  --  5.0* 4.6    Recent Labs Lab 04/11/13 1155 04/11/13 2216 04/12/13 0400 04/13/13 0500 04/15/13 0526 04/16/13 0518  AST  --  37 26 12 9   --  ALT  --  68* 61* 37 20  --   ALKPHOS  --  113 111 99 96  --   BILITOT  --  0.5 0.6 0.6 0.3  --   PROT  --  5.5* 5.6* 5.6* 6.1  --   ALBUMIN  --  3.0*  3.0* 3.1* 2.9* 3.0* 3.1*  INR 1.16  --  1.23  --   --   --      Recent Labs Lab 04/15/13 0611 04/15/13 1115 04/15/13 1642 04/15/13 2050 04/16/13 0635  GLUCAP 156* 188* 187* 240* 157*    CXR:  4/25: mild infiltrates, low lung volumes  ASSESSMENT / PLAN: Principal Problem:   Acute respiratory failure with hypercapnia Active Problems:   Pulmonary infiltrates   RBBB   History of renal transplantation   Acute on chronic renal failure   Immunocompromised   DM2 (diabetes mellitus, type 2)   Encephalopathy acute   Acute diastolic CHF (congestive heart failure)   Hypokalemia   Demand ischemia of myocardium   Protein-calorie malnutrition, severe   Pneumonia due to Hemophilus influenzae (H. influenzae)   PULMONARY A: Acute  hypercarbic resp failure -- Resolved  HCAP-- H flu (B lactam NEG) Pulmonary infiltrates - edema vs infection  P:   -extubated 4/22  Titrate oxygen BDs with xopenex  CARDIOVASCULAR A: Elevated BNP 16846 ( with Renal insufficiency) RBBB - unclear whether old or new Possible demand ischemia, with transient elevation in troponins-- ECG c/w acute ischemia with rising trop levels. Echo with no WMA, EF 60-65% Tachycardia and Nonsustained PVC  4/24 - transient low BP. RN advised to hold lopressor in AM but gie lasix and monitor  P:  - cont Metoprolol PO  - po amiodarone -for nuc med study for ischemia   RENAL A:  Hx of renal transplant Acute on chronic renal insuff.  Hypokalemia - Improved.  P:   Renal following  Monitor BMET -- Scr trending down 4/24 Cont lasix per renal    GASTROINTESTINAL A:  No issues P:   Advance diet   HEMATOLOGIC A:  No issues P:  Monitor CBC intermittently  INFECTIOUS A:  Immunosuppressed Pulmonary infiltrates,d/t H Parainfluenza PNA.   Hx of back abscess with Peptostreptococcus.   P:   Micro and abx as above Fungal and AFB culture are pending  STaff comment: DC  ceftaz, flagyl  And r change to renally dosed augmentin PO per ID recs (needs total 7 days abx) Will f/u with surgery as outpt for ?further debridement   ENDOCRINE A:  DM2 Poor glycemic control P:   Cont lantus, SSI CHO modified diet  NEUROLOGIC A:  Acute enceph due to hypercarbia>>now resolved P:   Mobilize    Physical Deconditioning A: He looks great P  -PT to f/u    [facility] [place]  [phone]  Cell  [phone]  If no response or cell goes to voicemail, call beeper [phone]  04/16/2013 11:16 AM

## 2013-04-16 NOTE — Progress Notes (Signed)
04/16/2013 1600 Pt. HR on monitor appeared to be Atrial Flutter HR 113.  Pt. Asleep in room asymptomatic. This rhythm lasted about 3 minutes then pt. Went back into Sinus Tach 124. EKG performed revealing Sinus Tach. Strip pulled from tele monitor and placed on chart. Dr. Irish Lack paged and made aware. No orders received at this time. Will continue to closely monitor patient.  Trejon Duford, [name]

## 2013-04-17 DIAGNOSIS — E669 Obesity, unspecified: Secondary | ICD-10-CM | POA: Diagnosis present

## 2013-04-17 LAB — CULTURE, BLOOD (ROUTINE X 2)
Culture: NO GROWTH
Culture: NO GROWTH

## 2013-04-17 LAB — RENAL FUNCTION PANEL
BUN: 45 mg/dL — ABNORMAL HIGH (ref 6–23)
Calcium: 9.6 mg/dL (ref 8.4–10.5)
Creatinine, Ser: 1.99 mg/dL — ABNORMAL HIGH (ref 0.50–1.35)
GFR calc Af Amer: 40 mL/min — ABNORMAL LOW (ref >90)
Glucose, Bld: 200 mg/dL — ABNORMAL HIGH (ref 70–99)
Phosphorus: 4.4 mg/dL (ref 2.3–4.6)
Sodium: 137 mEq/L (ref 135–145)

## 2013-04-17 LAB — GLUCOSE, CAPILLARY
Glucose-Capillary: 185 mg/dL — ABNORMAL HIGH (ref 70–99)
Glucose-Capillary: 231 mg/dL — ABNORMAL HIGH (ref 70–99)
Glucose-Capillary: 203 mg/dL — ABNORMAL HIGH (ref 70–99)

## 2013-04-17 LAB — CBC
Hemoglobin: 13 g/dL (ref 13.0–17.0)
MCH: 28.4 pg (ref 26.0–34.0)
MCHC: 30 g/dL (ref 30.0–36.0)
MCV: 94.7 fL (ref 78.0–100.0)

## 2013-04-17 MED ORDER — SODIUM CHLORIDE 0.9 % IJ SOLN
3.0000 mL | Freq: Two times a day (BID) | INTRAMUSCULAR | Status: DC
  Administered 2013-04-17 – 2013-04-20 (×8): 3 mL via INTRAVENOUS

## 2013-04-17 MED ORDER — METOPROLOL TARTRATE 50 MG PO TABS
50.0000 mg | ORAL_TABLET | Freq: Two times a day (BID) | ORAL | Status: DC
  Administered 2013-04-17 – 2013-04-21 (×9): 50 mg via ORAL
  Filled 2013-04-17 (×10): qty 1

## 2013-04-17 MED ORDER — FUROSEMIDE 80 MG PO TABS
160.0000 mg | ORAL_TABLET | Freq: Three times a day (TID) | ORAL | Status: DC
  Administered 2013-04-17 – 2013-04-20 (×11): 160 mg via ORAL
  Filled 2013-04-17 (×14): qty 2

## 2013-04-17 NOTE — Progress Notes (Signed)
Subjective: Interval History: has complaints concern of edema and low bp.  Objective: Vital signs in last 24 hours: Temp:  [97.7 F (36.5 C)-98.9 F (37.2 C)] 98.9 F (37.2 C) (04/26 0500) Pulse Rate:  [81-132] 92 (04/26 0500) Resp:  [18-19] 19 (04/26 0500) BP: (98-132)/(45-69) 132/60 mmHg (04/26 0500) SpO2:  [87 %-100 %] 94 % (04/26 0500) Weight:  [102.059 kg (225 lb)] 102.059 kg (225 lb) (04/26 0500) Weight change: 0.059 kg (2.1 oz)  Intake/Output from previous day: 04/25 0701 - 04/26 0700 In: 240 [P.O.:240] Out: 1001 [Urine:1000; Stool:1] Intake/Output this shift:    General appearance: alert, cooperative and moderately obese Resp: rales bibasilar Cardio: S1, S2 normal and systolic murmur: holosystolic 2/6, blowing at apex GI: pos bs,liver down 5 cm, Tx RLQ Extremities: edema 3+  Lab Results:  Recent Labs  04/16/13 0518 04/17/13 0551  WBC 9.0 9.8  HGB 12.4* 13.0  HCT 41.6 43.3  PLT 144* 144*   BMET:  Recent Labs  04/16/13 0518 04/17/13 0551  NA 140 137  K 4.4 5.1  CL 100 96  CO2 32 26  GLUCOSE 165* 200*  BUN 43* 45*  CREATININE 2.16* 1.99*  CALCIUM 9.8 9.6   No results found for this basename: PTH,  in the last 72 hours Iron Studies: No results found for this basename: IRON, TIBC, TRANSFERRIN, FERRITIN,  in the last 72 hours  Studies/Results: Dg Chest 2 View  04/16/2013  *RADIOLOGY REPORT*  Clinical Data: Pulmonary infiltrates  CHEST - 2 VIEW  Comparison: Chest radiograph 04/14/2013  Findings: Removal of right central venous line.  Stable cardiac silhouette.  There are low lung volumes and bibasilar atelectasis. Small effusions similar to prior.  There is mild perihilar air space disease/interstitial edema.  No pneumothorax  IMPRESSION: 1.  No interval change. 2.  Low lung volumes and basilar atelectasis. 3.  Central venous congestion/interstitial edema/pulmonary edema not changed from prior.   Original Report Authenticated By: Suzy Bouchard, M.D.     Nm Myocar Multi W/spect W/wall Motion / Ef  04/16/2013  *RADIOLOGY REPORT*  Clinical Data:  Chest pain.  MYOCARDIAL IMAGING WITH SPECT (REST AND PHARMACOLOGIC-STRESS) GATED LEFT VENTRICULAR WALL MOTION STUDY LEFT VENTRICULAR EJECTION FRACTION  Technique:  Standard myocardial SPECT imaging was performed after resting intravenous injection of 10 mCi Tc-38m sestamibi. Subsequently, intravenous infusion of Lexiscan was performed under the supervision of the Cardiology staff.  At peak effect of the drug, 30 mCi Tc-23m sestamibi was injected intravenously and standard myocardial SPECT imaging was performed.  Quantitative gated imaging was also performed to evaluate left ventricular wall motion and estimate left ventricular ejection fraction.  Comparison:  Chest radiograph 04/16/2013.  Findings:  No fixed or reversible defects are identified.  The calculated ejection fraction is 68%.  End-systolic volume is 25 ml. End-diastolic volume is 79 ml.  Wall motion appears normal.  IMPRESSION:  1.  No evidence of fixed or reversible ischemia/infarct. 2.  Calculated ejection fraction 68%.   Original Report Authenticated By: Dereck Ligas, M.D.     I have reviewed the patient's current medications.  Assessment/Plan: 1 Renal Tx Cr stable, vol xs.  Will ^ diuretics.  Bp too low on ^ dose Metoprolol,lower 2 Cards scan Pending, NSR 3 Dm controlled 4 Obesity 5 Pulm per Dr. Joya Gaskins P ^ Lasix, lower metoprolol, follow Cr    LOS: 6 days   Yoon Barca L 04/17/2013,8:41 AM

## 2013-04-17 NOTE — Progress Notes (Signed)
Pt tolerated ambulation in hallway, with o2 at 3 liters, gait steady,denies sob Joylene Draft A

## 2013-04-17 NOTE — Progress Notes (Signed)
Patient ID: Russell Bowers, male   DOB: Nov 12, 1951, 62 y.o.   MRN: [id number] Disregard this note.

## 2013-04-17 NOTE — Progress Notes (Signed)
Patient ID: YOUSIF MICHAELSEN, male   DOB: Mar 16, 1951, 62 y.o.   MRN: [id number]   Patient Name: Russell Bowers Date of Encounter: 04/17/2013    SUBJECTIVE  Tired. Wife says he is sleeping a lot off and on during the day. No chest pain or shortness of breath. He is tired of being bothered every time his heart rate increases. I discussed protocol we'll try to lessen. Myoview negative with good systolic function  CURRENT MEDS . amoxicillin-clavulanate  1 tablet Oral Q12H  . aspirin EC  325 mg Oral Daily  . furosemide  160 mg Oral TID  . heparin subcutaneous  5,000 Units Subcutaneous Q8H  . insulin aspart  0-15 Units Subcutaneous TID AC & HS  . insulin glargine  8 Units Subcutaneous QHS  . metoprolol tartrate  50 mg Oral BID  . mycophenolate  360 mg Oral BID  . pantoprazole  40 mg Oral QHS  . tacrolimus  3 mg Oral BID    OBJECTIVE  Filed Vitals:   04/16/13 1746 04/16/13 1751 04/16/13 2052 04/17/13 0500  BP:   98/55 132/60  Pulse: 126 121 81 92  Temp:   98 F (36.7 C) 98.9 F (37.2 C)  TempSrc:   Oral Oral  Resp:   18 19  Height:      Weight:    225 lb (102.059 kg)  SpO2: 87% 96% 100% 94%    Intake/Output Summary (Last 24 hours) at 04/17/13 0943 Last data filed at 04/16/13 1700  Gross per 24 hour  Intake    240 ml  Output    801 ml  Net   -561 ml   Filed Weights   04/15/13 0614 04/16/13 0452 04/17/13 0500  Weight: 220 lb 14.4 oz (100.2 kg) 224 lb 13.9 oz (102 kg) 225 lb (102.059 kg)    PHYSICAL EXAM  General: Pleasant, NAD. Chronically ill Neuro: Alert and oriented X 3. Moves all extremities spontaneously. Psych: Normal affect. HEENT:  Normal  Neck: Supple without bruits or JVD. Lungs:  Resp regular and unlabored, bibasal crackles one third way up Heart: RRR no s3, s4, or murmurs. Abdomen: Soft, non-tender, non-distended, BS + x 4.  Extremities: No clubbing, cyanosis , 2+ edema bilaterally DP/PT/Radials 2+ and equal bilaterally.  Accessory Clinical  Findings  CBC  Recent Labs  04/16/13 0518 04/17/13 0551  WBC 9.0 9.8  HGB 12.4* 13.0  HCT 41.6 43.3  MCV 93.3 94.7  PLT 144* 123456*   Basic Metabolic Panel  Recent Labs  04/16/13 0518 04/17/13 0551  NA 140 137  K 4.4 5.1  CL 100 96  CO2 32 26  GLUCOSE 165* 200*  BUN 43* 45*  CREATININE 2.16* 1.99*  CALCIUM 9.8 9.6  PHOS 4.6 4.4   Liver Function Tests  Recent Labs  04/15/13 0526 04/16/13 0518 04/17/13 0551  AST 9  --   --   ALT 20  --   --   ALKPHOS 96  --   --   BILITOT 0.3  --   --   PROT 6.1  --   --   ALBUMIN 3.0* 3.1* 3.0*   No results found for this basename: LIPASE, AMYLASE,  in the last 72 hours Cardiac Enzymes No results found for this basename: CKTOTAL, CKMB, CKMBINDEX, TROPONINI,  in the last 72 hours BNP No components found with this basename: POCBNP,  D-Dimer No results found for this basename: DDIMER,  in the last 72 hours Hemoglobin A1C No results found  for this basename: HGBA1C,  in the last 72 hours Fasting Lipid Panel No results found for this basename: CHOL, HDL, LDLCALC, TRIG, CHOLHDL, LDLDIRECT,  in the last 72 hours Thyroid Function Tests No results found for this basename: TSH, T4TOTAL, FREET3, T3FREE, THYROIDAB,  in the last 72 hours  TELE  Normal sinus rhythm, atrial tach  ECG    Radiology/Studies  Dg Chest 2 View  04/16/2013  *RADIOLOGY REPORT*  Clinical Data: Pulmonary infiltrates  CHEST - 2 VIEW  Comparison: Chest radiograph 04/14/2013  Findings: Removal of right central venous line.  Stable cardiac silhouette.  There are low lung volumes and bibasilar atelectasis. Small effusions similar to prior.  There is mild perihilar air space disease/interstitial edema.  No pneumothorax  IMPRESSION: 1.  No interval change. 2.  Low lung volumes and basilar atelectasis. 3.  Central venous congestion/interstitial edema/pulmonary edema not changed from prior.   Original Report Authenticated By: Suzy Bouchard, M.D.    Dg Chest 2  View  04/05/2013  *RADIOLOGY REPORT*  Clinical Data: Chest pain and cough.  History of renal transplant.  CHEST - 2 VIEW  Comparison: 10/01/2011.  Findings: Trachea is midline.  Heart size is grossly stable.  Lungs are low in volume with central pulmonary vascular congestion and scattered linear densities at the lung bases.  No pleural fluid.  IMPRESSION: Very low lung volumes with central pulmonary vascular congestion and bibasilar subsegmental atelectasis.   Original Report Authenticated By: Lorin Picket, M.D.    Nm Myocar Multi W/spect W/Mayte Diers Motion / Ef  04/16/2013  *RADIOLOGY REPORT*  Clinical Data:  Chest pain.  MYOCARDIAL IMAGING WITH SPECT (REST AND PHARMACOLOGIC-STRESS) GATED LEFT VENTRICULAR Tekela Garguilo MOTION STUDY LEFT VENTRICULAR EJECTION FRACTION  Technique:  Standard myocardial SPECT imaging was performed after resting intravenous injection of 10 mCi Tc-34m sestamibi. Subsequently, intravenous infusion of Lexiscan was performed under the supervision of the Cardiology staff.  At peak effect of the drug, 30 mCi Tc-12m sestamibi was injected intravenously and standard myocardial SPECT imaging was performed.  Quantitative gated imaging was also performed to evaluate left ventricular Maicy Filip motion and estimate left ventricular ejection fraction.  Comparison:  Chest radiograph 04/16/2013.  Findings:  No fixed or reversible defects are identified.  The calculated ejection fraction is 68%.  End-systolic volume is 25 ml. End-diastolic volume is 79 ml.  Terez Freimark motion appears normal.  IMPRESSION:  1.  No evidence of fixed or reversible ischemia/infarct. 2.  Calculated ejection fraction 68%.   Original Report Authenticated By: Dereck Ligas, M.D.    Dg Chest Port 1 View  04/14/2013  *RADIOLOGY REPORT*  Clinical Data: Extubation, shortness of breath  PORTABLE CHEST - 1 VIEW  Comparison: Portable exam 0501 hours compared to 04/13/2013  Findings: Tip of right jugular line stable projecting over high right atrium.  Enlargement of cardiac silhouette with vascular congestion. Bibasilar atelectasis versus consolidation. Associated left pleural effusion. No pneumothorax or acute osseous findings.  IMPRESSION: Persistent bibasilar opacities and left pleural effusion. Enlargement of cardiac silhouette with pulmonary vascular congestion.   Original Report Authenticated By: Lavonia Dana, M.D.    Dg Chest Port 1 View  04/13/2013  *RADIOLOGY REPORT*  Clinical Data: Pleural effusions and airspace disease.  Ventilated patient.  PORTABLE CHEST - 1 VIEW  Comparison: Chest x-ray 04/12/2013.  Findings: An endotracheal tube is in place with tip 1.7 cm above the carina. There is a right-sided internal jugular central venous catheter with tip terminating in the right atrium. Nasogastric tube is seen extending into  either of the antral prepyloric region of the stomach or the proximal duodenum.  Lung volumes are extremely low, and there are extensive bibasilar opacities which may reflect areas of atelectasis and/or consolidation.  Small left pleural effusion.  Pulmonary venous congestion, accentuated by low lung volumes, without frank pulmonary edema.  Cardiac silhouette is largely obscured, but appears likely to be enlarged. The patient is rotated to the right on today's exam, resulting in distortion of the mediastinal contours and reduced diagnostic sensitivity and specificity for mediastinal pathology.  IMPRESSION: 1.  Support apparatus, as above. 2.  Severely decreased lung volumes with persistent bibasilar atelectasis and/or consolidation and small left pleural effusion.   Original Report Authenticated By: Vinnie Langton, M.D.    Dg Chest Port 1 View  04/12/2013  *RADIOLOGY REPORT*  Clinical Data: Respiratory failure.  Shortness of breath.  PORTABLE CHEST - 1 VIEW  Comparison: Chest 04/11/2013 and 04/05/2013  Findings: Support tubes and lines are unchanged.  There has been interval increase in bilateral pleural effusions and basilar  airspace disease.  Pulmonary edema appears unchanged.  IMPRESSION: Increased bilateral pleural effusions and basilar airspace disease. No marked change in pulmonary edema.   Original Report Authenticated By: Orlean Patten, M.D.    Dg Chest Portable 1 View  04/11/2013  *RADIOLOGY REPORT*  Clinical Data: Post intubation and central line placement.  PORTABLE CHEST - 1 VIEW  Comparison: 04/11/2013  Findings: Endotracheal tube is 3.4 cm above the carina.  Right central line tip is at the cavoatrial junction.  No pneumothorax. Low lung volumes with bilateral airspace opacities, likely edema. Mild cardiomegaly.  Suspect small effusions.  NG tube is in place within the stomach.  IMPRESSION: Support devices as above.  Continued bilateral airspace opacities, likely edema.  Low volumes with bibasilar atelectasis and small effusions.   Original Report Authenticated By: Rolm Baptise, M.D.    Dg Chest Port 1 View  04/11/2013  *RADIOLOGY REPORT*  Clinical Data: Chest pain, shortness of breath.  PORTABLE CHEST - 1 VIEW  Comparison: 04/05/2013  Findings: Low lung volumes.  Cardiomegaly.  Vascular congestion and diffuse bilateral airspace opacities, likely edema.  Bibasilar atelectasis.  No visible effusions.  IMPRESSION: Low lung volumes with bibasilar atelectasis.  Mild CHF.   Original Report Authenticated By: Rolm Baptise, M.D.     ASSESSMENT AND PLAN  Principal Problem:   Acute respiratory failure with hypercapnia Active Problems:   Pulmonary infiltrates   RBBB   History of renal transplantation   Acute on chronic renal failure   Immunocompromised   DM2 (diabetes mellitus, type 2)   Encephalopathy acute   Acute diastolic CHF (congestive heart failure)   Hypokalemia   Demand ischemia of myocardium   Protein-calorie malnutrition, severe   Pneumonia due to Hemophilus influenzae (H. influenzae)    Stable cardiac-wise. Stress Myoview negative for ischemia with normal left ventricular systolic function.  Agree with Dr.Deterding with decreasing metoprolol. The atrial tachycardia at he says is not symptomatic and he wishes not to be bothered every time it happens. Have discussed with nursing. All questions answered. Diuresis per renal. No indication for cardiac catheterization. Discussed with patient and wife. Signed, Jenell Milliner MD

## 2013-04-17 NOTE — Progress Notes (Signed)
PULMONARY  / CRITICAL CARE MEDICINE  Name: Russell Bowers MRN: [id number] DOB: November 15, 1951    ADMISSION DATE:  04/11/2013   BRIEF PATIENT DESCRIPTION:  17 male with hx of renal transplant 11/2011, intubated by PCCM in ED for acute on chronic hypercarbic respiratory failure after 1-2 months of cough, atypical chest pain and marked increase in DOE. Bilateral pulmonary infiltrates on CXR. Very similar admission in Feb 2013 @ [place].   SIGNIFICANT EVENTS / STUDIES:  4/20 admitted with hypercarbic respiratory failure, pulmonary infiltrates, acute on chronic renal insuff, RBBB 4/22 extubated  LINES / TUBES: ETT 4/20 >> 4/22 R IJ CVL 4/20 >>4/23   CULTURES: MRSA PCR 4/20 >> NEG Strep Ag 4/20 >> NEG Legionella Ag 4/20 >>Negative  Streptococcal Urine Ag 4/20 >> negative   Urine 4/20 >> neg  Resp virus panel 4/20 >> negative  PJP DFA 4/20 >> negative  BAL cx 4/20 >> H. Influenzae, beta lactam neg Fungal respiratory cx 4/21 >> AFB resp. cx 4/21 >> AFB smear - negative  Blood 4/20 >> neg   ANTIBIOTICS: ABX per ID  Vanc 4/20 >> 4/23 Ceftaz 4/20 >>  Levoflox 4/20 >> 4/21  Flagyl 4/21>>4/23 augmentin (per ID) 4/24>>    SUBJECTIVE:  No change overnight, and no increased wob.  Denies significant cough or congestion.   VITAL SIGNS: Temp:  [97.7 F (36.5 C)-98.9 F (37.2 C)] 98.9 F (37.2 C) (04/26 0500) Pulse Rate:  [81-132] 87 (04/26 0950) Resp:  [18-19] 19 (04/26 0500) BP: (98-132)/(45-69) 119/54 mmHg (04/26 0950) SpO2:  [87 %-100 %] 94 % (04/26 0500) Weight:  [102.059 kg (225 lb)] 102.059 kg (225 lb) (04/26 0500) 2L [place]    INTAKE / OUTPUT: Intake/Output     04/25 0701 - 04/26 0700 04/26 0701 - 04/27 0700   P.O. 240    Total Intake(mL/kg) 240 (2.4)    Urine (mL/kg/hr) 1000 (0.4)    Stool 1 (0)    Total Output 1001     Net -761            PHYSICAL EXAMINATION: General: obese male in nad.   Neuro:  Awake, alert, appropriate, moves all extremities HEENT:  Nares  without purulence, op clear Neck without TMG or LN Cardiovascular:  RRR, 2/6 sem Lungs: decreased bs in bases with a few crackles, no wheezing Abdomen:  obese, soft, NT LE with 2+ edema, no cyanosis  LABS:  Recent Labs Lab 04/15/13 0526 04/16/13 0518 04/17/13 0551  HGB 12.5* 12.4* 13.0  HCT 41.7 41.6 43.3  WBC 8.6 9.0 9.8  PLT 153 144* 144*    Recent Labs Lab 04/11/13 2216 04/12/13 0400 04/13/13 0500 04/14/13 0435 04/15/13 0526 04/16/13 0518 04/17/13 0551  NA 139 142 145 144 144 140 137  K 3.8 3.5 3.3* 3.7 4.3 4.4 5.1  CL 98 100 102 103 103 100 96  CO2 33* 30 35* 35* 32 32 26  GLUCOSE 150* 73 208* 179* 171* 165* 200*  BUN 58* 56* 63* 54* 44* 43* 45*  CREATININE 2.66* 2.68* 2.29* 2.17* 2.08* 2.16* 1.99*  CALCIUM 9.3 9.6 9.7 9.6 9.9 9.8 9.6  MG  --  1.9  --  1.9  --   --   --   PHOS 1.9* 1.9* 2.7  --  5.0* 4.6 4.4    Recent Labs Lab 04/11/13 1155  04/11/13 2216 04/12/13 0400 04/13/13 0500 04/15/13 0526 04/16/13 0518 04/17/13 0551  AST  --   --  37 26 12 9   --   --  ALT  --   --  68* 61* 37 20  --   --   ALKPHOS  --   --  113 111 99 96  --   --   BILITOT  --   --  0.5 0.6 0.6 0.3  --   --   PROT  --   --  5.5* 5.6* 5.6* 6.1  --   --   ALBUMIN  --   < > 3.0*  3.0* 3.1* 2.9* 3.0* 3.1* 3.0*  INR 1.16  --   --  1.23  --   --   --   --   < > = values in this interval not displayed.   Recent Labs Lab 04/16/13 0635 04/16/13 1451 04/16/13 1609 04/16/13 2038 04/17/13 0610  GLUCAP 157* 130* 192* 278* 185*    CXR:  4/25: mild infiltrates, low lung volumes  ASSESSMENT / PLAN: Principal Problem:   Acute respiratory failure with hypercapnia Active Problems:   Pulmonary infiltrates   RBBB   History of renal transplantation   Acute on chronic renal failure   Immunocompromised   DM2 (diabetes mellitus, type 2)   Encephalopathy acute   Acute diastolic CHF (congestive heart failure)   Hypokalemia   Demand ischemia of myocardium   Protein-calorie  malnutrition, severe   Pneumonia due to Hemophilus influenzae (H. influenzae)   PULMONARY A: Acute hypercarbic resp failure -- Resolved  HCAP-- H flu (B lactam NEG) Pulmonary infiltrates - edema vs infection  P:   -looks comfortable with no increased wob. Titrate oxygen BDs with xopenex  CARDIOVASCULAR A: Elevated BNP 16846 ( with Renal insufficiency) RBBB - unclear whether old or new Possible demand ischemia, with transient elevation in troponins-- ECG c/w acute ischemia with rising trop levels. Echo with no WMA, EF 60-65% Tachycardia and Nonsustained PVC - myoview unremarkable.   RENAL A:  Hx of renal transplant Acute on chronic renal insuff.  Hypokalemia - Improved.  P:   Renal following  Monitor BMET Cont lasix per renal    INFECTIOUS A:  Immunosuppressed Pulmonary infiltrates,d/t H Parainfluenza PNA.   Hx of back abscess with Peptostreptococcus.   P:   Micro and abx as above Fungal and AFB culture are pending  On augmentin per ID Will f/u with surgery as outpt for ?further debridement   ENDOCRINE A:  DM2 Poor glycemic control P:   Cont lantus, SSI CHO modified diet

## 2013-04-18 LAB — RENAL FUNCTION PANEL
BUN: 40 mg/dL — ABNORMAL HIGH (ref 6–23)
CO2: 35 mEq/L — ABNORMAL HIGH (ref 19–32)
Chloride: 98 mEq/L (ref 96–112)
GFR calc Af Amer: 41 mL/min — ABNORMAL LOW (ref >90)
Glucose, Bld: 218 mg/dL — ABNORMAL HIGH (ref 70–99)
Phosphorus: 4 mg/dL (ref 2.3–4.6)
Potassium: 4.9 mEq/L (ref 3.5–5.1)
Sodium: 140 mEq/L (ref 135–145)

## 2013-04-18 LAB — BLOOD GAS, ARTERIAL
Acid-Base Excess: 7.2 mmol/L — ABNORMAL HIGH (ref 0.0–2.0)
Drawn by: 21338
O2 Content: 3 L/min
pCO2 arterial: 85 mmHg (ref 35.0–45.0)
pH, Arterial: 7.232 — ABNORMAL LOW (ref 7.350–7.450)

## 2013-04-18 LAB — GLUCOSE, CAPILLARY
Glucose-Capillary: 211 mg/dL — ABNORMAL HIGH (ref 70–99)
Glucose-Capillary: 150 mg/dL — ABNORMAL HIGH (ref 70–99)

## 2013-04-18 LAB — CBC
HCT: 44.5 % (ref 39.0–52.0)
Hemoglobin: 12.8 g/dL — ABNORMAL LOW (ref 13.0–17.0)
RBC: 4.59 MIL/uL (ref 4.22–5.81)
WBC: 9.2 10*3/uL (ref 4.0–10.5)

## 2013-04-18 MED ORDER — INSULIN GLARGINE 100 UNIT/ML ~~LOC~~ SOLN
15.0000 [IU] | Freq: Every day | SUBCUTANEOUS | Status: DC
  Administered 2013-04-18 – 2013-04-20 (×3): 15 [IU] via SUBCUTANEOUS
  Filled 2013-04-18 (×6): qty 0.15

## 2013-04-18 MED ORDER — METOLAZONE 10 MG PO TABS
10.0000 mg | ORAL_TABLET | Freq: Once | ORAL | Status: AC
  Administered 2013-04-18: 10 mg via ORAL
  Filled 2013-04-18: qty 1

## 2013-04-18 NOTE — Progress Notes (Signed)
Blood Gas report phoned to Dr. Jimmy Footman, O2 decreased to 1 liter per order , pt sitting in chair at bedside, fall to sleep easily arouses easily. Continue to monitor closely Russell Bowers

## 2013-04-18 NOTE — Progress Notes (Signed)
Subjective: Interval History: has complaints wife says not mentating well, more lethargic.  Objective: Vital signs in last 24 hours: Temp:  [98.7 F (37.1 C)-99.1 F (37.3 C)] 98.7 F (37.1 C) (04/27 0431) Pulse Rate:  [89-94] 94 (04/27 0431) Resp:  [18-21] 21 (04/27 0431) BP: (130-161)/(63-84) 142/64 mmHg (04/27 0431) SpO2:  [90 %-98 %] 95 % (04/27 0440) Weight:  [101.742 kg (224 lb 4.8 oz)] 101.742 kg (224 lb 4.8 oz) (04/27 0431) Weight change: -0.318 kg (-11.2 oz)  Intake/Output from previous day: 04/26 0701 - 04/27 0700 In: 960 [P.O.:960] Out: 600 [Urine:600] Intake/Output this shift: Total I/O In: 360 [P.O.:360] Out: 200 [Urine:200]  General appearance: alert, cooperative and slowed mentation Resp: diminished breath sounds bibasilar and rales bibasilar Cardio: S1, S2 normal and systolic murmur: systolic ejection 2/6, decrescendo at 2nd left intercostal space GI: liver down 6 cm, TX RLQ Extremities: edema 2+  Lab Results:  Recent Labs  04/17/13 0551 04/18/13 0544  WBC 9.8 9.2  HGB 13.0 12.8*  HCT 43.3 44.5  PLT 144* 153   BMET:  Recent Labs  04/17/13 0551 04/18/13 0544  NA 137 140  K 5.1 4.9  CL 96 98  CO2 26 35*  GLUCOSE 200* 218*  BUN 45* 40*  CREATININE 1.99* 1.94*  CALCIUM 9.6 9.6   No results found for this basename: PTH,  in the last 72 hours Iron Studies: No results found for this basename: IRON, TIBC, TRANSFERRIN, FERRITIN,  in the last 72 hours  Studies/Results: Nm Myocar Multi W/spect W/wall Motion / Ef  04/16/2013  *RADIOLOGY REPORT*  Clinical Data:  Chest pain.  MYOCARDIAL IMAGING WITH SPECT (REST AND PHARMACOLOGIC-STRESS) GATED LEFT VENTRICULAR WALL MOTION STUDY LEFT VENTRICULAR EJECTION FRACTION  Technique:  Standard myocardial SPECT imaging was performed after resting intravenous injection of 10 mCi Tc-38m sestamibi. Subsequently, intravenous infusion of Lexiscan was performed under the supervision of the Cardiology staff.  At peak  effect of the drug, 30 mCi Tc-2m sestamibi was injected intravenously and standard myocardial SPECT imaging was performed.  Quantitative gated imaging was also performed to evaluate left ventricular wall motion and estimate left ventricular ejection fraction.  Comparison:  Chest radiograph 04/16/2013.  Findings:  No fixed or reversible defects are identified.  The calculated ejection fraction is 68%.  End-systolic volume is 25 ml. End-diastolic volume is 79 ml.  Wall motion appears normal.  IMPRESSION:  1.  No evidence of fixed or reversible ischemia/infarct. 2.  Calculated ejection fraction 68%.   Original Report Authenticated By: Dereck Ligas, M.D.     I have reviewed the patient's current medications.  Assessment/Plan: 1 Renal TX Cr stable but vol xs.  Slow diuresis will add Zarox. TAC level ok 2 CHF still signif 3 Afib now in NSR 4 OSA ? Role in MS 5 Obesity 6 DM controlled P Zarox, Lasix, ABG  LOS: 7 days   Ahan Eisenberger L 04/18/2013,9:55 AM

## 2013-04-18 NOTE — Progress Notes (Signed)
Dr. Gwenette Greet notified of AGAS results, orders to tx to stepdown for bipap, pt and wife made aware Alfonzo Feller

## 2013-04-18 NOTE — Progress Notes (Signed)
Pt. Received to room 2617.  Pleasant, alert and oriented.  Pt. Introduced to staff and to unit routine.  MD in to re-assess and pt. Placed on bi-pap due to increased hypercapnea and resulting alkalosis.  Explanation of therapy given to patient and wife from MD and supported by nursing staff.

## 2013-04-18 NOTE — Progress Notes (Signed)
Pt wife at bedside, voiced concern regarding pt being sleepy, awakens easily then falls back to sleep, O2 3 liters sats 95%,  discussed concerns with Dr. Gildardo Griffes, Janalyn Harder

## 2013-04-18 NOTE — Progress Notes (Signed)
PULMONARY  / CRITICAL CARE MEDICINE  Name: Russell Bowers MRN: [id number] DOB: [birth year]    ADMISSION DATE:  04/11/2013   BRIEF PATIENT DESCRIPTION:  6 male with hx of renal transplant 11/2011, intubated by PCCM in ED for acute on chronic hypercarbic respiratory failure after 1-2 months of cough, atypical chest pain and marked increase in DOE. Bilateral pulmonary infiltrates on CXR. Very similar admission in Feb 2013 @ [place].   SIGNIFICANT EVENTS / STUDIES:  4/20 admitted with hypercarbic respiratory failure, pulmonary infiltrates, acute on chronic renal insuff, RBBB 4/22 extubated  LINES / TUBES: ETT 4/20 >> 4/22 R IJ CVL 4/20 >>4/23   CULTURES: MRSA PCR 4/20 >> NEG Strep Ag 4/20 >> NEG Legionella Ag 4/20 >>Negative  Streptococcal Urine Ag 4/20 >> negative   Urine 4/20 >> neg  Resp virus panel 4/20 >> negative  PJP DFA 4/20 >> negative  BAL cx 4/20 >> H. Influenzae, beta lactam neg Fungal respiratory cx 4/21 >> AFB resp. cx 4/21 >> AFB smear - negative  Blood 4/20 >> neg   ANTIBIOTICS: ABX per ID  Vanc 4/20 >> 4/23 Ceftaz 4/20 >>  Levoflox 4/20 >> 4/21  Flagyl 4/21>>4/23 augmentin (per ID) 4/24>>    SUBJECTIVE:  Became more sedated this am, and ABG shows acute on chronic hypercarbia.  Pt has been moved to stepdown for bilevel.  Currently on NIPPV and completely awake, adequate sats, and no increased wob.   VITAL SIGNS: Temp:  [97.5 F (36.4 C)-99.1 F (37.3 C)] 97.7 F (36.5 C) (04/27 1221) Pulse Rate:  [83-94] 83 (04/27 1236) Resp:  [18-37] 37 (04/27 1236) BP: (123-161)/(61-84) 123/70 mmHg (04/27 1236) SpO2:  [90 %-100 %] 95 % (04/27 1236) Weight:  [101.742 kg (224 lb 4.8 oz)-102.9 kg (226 lb 13.7 oz)] 102.9 kg (226 lb 13.7 oz) (04/27 1221) 2L [place]    INTAKE / OUTPUT: Intake/Output     04/26 0701 - 04/27 0700 04/27 0701 - 04/28 0700   P.O. 960 840   Total Intake(mL/kg) 960 (9.4) 840 (8.2)   Urine (mL/kg/hr) 600 (0.2) 200 (0.3)   Stool     Total  Output 600 200   Net +360 +640          PHYSICAL EXAMINATION: General: obese male in nad.   Neuro:  Awake, alert, appropriate, moves all extremities HEENT:  Bilevel in place Neck without TMG or LN Cardiovascular:  RRR, 2/6 sem Lungs: decreased bs in bases , o/w clear Abdomen:  obese, soft, NT LE with 2+ edema, no cyanosis  LABS:  Recent Labs Lab 04/16/13 0518 04/17/13 0551 04/18/13 0544  HGB 12.4* 13.0 12.8*  HCT 41.6 43.3 44.5  WBC 9.0 9.8 9.2  PLT 144* 144* 153    Recent Labs Lab 04/11/13 2216 04/12/13 0400 04/13/13 0500 04/14/13 0435 04/15/13 0526 04/16/13 0518 04/17/13 0551 04/18/13 0544  NA 139 142 145 144 144 140 137 140  K 3.8 3.5 3.3* 3.7 4.3 4.4 5.1 4.9  CL 98 100 102 103 103 100 96 98  CO2 33* 30 35* 35* 32 32 26 35*  GLUCOSE 150* 73 208* 179* 171* 165* 200* 218*  BUN 58* 56* 63* 54* 44* 43* 45* 40*  CREATININE 2.66* 2.68* 2.29* 2.17* 2.08* 2.16* 1.99* 1.94*  CALCIUM 9.3 9.6 9.7 9.6 9.9 9.8 9.6 9.6  MG  --  1.9  --  1.9  --   --   --   --   PHOS 1.9* 1.9* 2.7  --  5.0* 4.6 4.4 4.0    Recent Labs Lab 04/11/13 2216 04/12/13 0400 04/13/13 0500 04/15/13 0526 04/16/13 0518 04/17/13 0551 04/18/13 0544  AST 37 26 12 9   --   --   --   ALT 68* 61* 37 20  --   --   --   ALKPHOS 113 111 99 96  --   --   --   BILITOT 0.5 0.6 0.6 0.3  --   --   --   PROT 5.5* 5.6* 5.6* 6.1  --   --   --   ALBUMIN 3.0*  3.0* 3.1* 2.9* 3.0* 3.1* 3.0* 3.1*  INR  --  1.23  --   --   --   --   --      Recent Labs Lab 04/17/13 1113 04/17/13 1607 04/17/13 2029 04/18/13 0600 04/18/13 1058  GLUCAP 231* 165* 203* 211* 241*    CXR:  4/25: mild infiltrates, low lung volumes  ASSESSMENT / PLAN: Principal Problem:   Acute respiratory failure with hypercapnia Active Problems:   Pulmonary infiltrates   RBBB   History of renal transplantation   Acute on chronic renal failure   Immunocompromised   DM2 (diabetes mellitus, type 2)   Encephalopathy acute   Acute  diastolic CHF (congestive heart failure)   Hypokalemia   Demand ischemia of myocardium   Protein-calorie malnutrition, severe   Pneumonia due to Hemophilus influenzae (H. influenzae)   Obesity, unspecified   PULMONARY A: Acute hypercarbic resp failure -- Resolved  HCAP-- H flu (B lactam NEG) Pulmonary infiltrates - edema vs infection  Now with worsening hypercarbia In hindsite, I think the pt has chronic hypercarbia (bicarb in 30's earlier this year even in the face of CRI), and more than likely this is due to OHS.  He also has a h/o osa and was unable to tolerate cpap at home [facility])   P:   -keep on bipap qhs and prn during day -minimize oxygen to keep sats 90%   CARDIOVASCULAR A: Elevated BNP 16846 ( with Renal insufficiency) RBBB - unclear whether old or new Possible demand ischemia, with transient elevation in troponins-- ECG c/w acute ischemia with rising trop levels. Echo with no WMA, EF 60-65% Tachycardia and Nonsustained PVC - myoview unremarkable.   RENAL A:  Hx of renal transplant Acute on chronic renal insuff.  Hypokalemia - Improved.  P:   Renal following  Monitor BMET Cont lasix per renal    INFECTIOUS A:  Immunosuppressed Pulmonary infiltrates,d/t H Parainfluenza PNA.   Hx of back abscess with Peptostreptococcus.   P:   Micro and abx as above Fungal and AFB culture are pending  On augmentin per ID Will f/u with surgery as outpt for ?further debridement   ENDOCRINE A:  DM2 Poor glycemic control P:   -increase hs lantus, continue ssi

## 2013-04-18 NOTE — Progress Notes (Signed)
Pt transferred to 2600 via bed, with monitor and O2 l liter, with belongings and accompained by RN, & wife present Russell Bowers

## 2013-04-19 LAB — GLUCOSE, CAPILLARY
Glucose-Capillary: 202 mg/dL — ABNORMAL HIGH (ref 70–99)
Glucose-Capillary: 260 mg/dL — ABNORMAL HIGH (ref 70–99)

## 2013-04-19 LAB — CBC
HCT: 42.8 % (ref 39.0–52.0)
Hemoglobin: 12.6 g/dL — ABNORMAL LOW (ref 13.0–17.0)
MCHC: 29.4 g/dL — ABNORMAL LOW (ref 30.0–36.0)
MCV: 95.1 fL (ref 78.0–100.0)

## 2013-04-19 LAB — COMPREHENSIVE METABOLIC PANEL
ALT: 21 U/L (ref 0–53)
Alkaline Phosphatase: 75 U/L (ref 39–117)
BUN: 42 mg/dL — ABNORMAL HIGH (ref 6–23)
CO2: 39 mEq/L — ABNORMAL HIGH (ref 19–32)
Calcium: 10.2 mg/dL (ref 8.4–10.5)
GFR calc Af Amer: 37 mL/min — ABNORMAL LOW (ref >90)
GFR calc non Af Amer: 32 mL/min — ABNORMAL LOW (ref >90)
Glucose, Bld: 164 mg/dL — ABNORMAL HIGH (ref 70–99)
Sodium: 140 mEq/L (ref 135–145)

## 2013-04-19 NOTE — Progress Notes (Signed)
Physical Therapy Treatment Patient Details Name: Russell Bowers MRN: [id number] DOB: 12/30/1950 Today's Date: 04/19/2013 Time: [id number] PT Time Calculation (min): 26 min  PT Assessment / Plan / Recommendation Comments on Treatment Session  Pt moving well.  Performed DGI (steps not performed).  Scored 21/24 for components of assessement completed.  Pt's 02 sats quickly drop to mid 80's without supplemental 02 (Paul removed to blow his nose).   Pt on 3L entire session.  Strongly encouraged pt to ambulate with Nsing 2-3x's/day with Nsing.      Follow Up Recommendations  No PT follow up;Supervision - Intermittent     Does the patient have the potential to tolerate intense rehabilitation     Barriers to Discharge        Equipment Recommendations  None recommended by PT    Recommendations for Other Services    Frequency Min 3X/week   Plan Discharge plan remains appropriate    Precautions / Restrictions Precautions Precaution Comments: Decreased O2 sat with mobility on O2 Restrictions Weight Bearing Restrictions: No       Mobility  Bed Mobility Bed Mobility: Not assessed Transfers Transfers: Sit to Stand;Stand to Sit Sit to Stand: 7: Independent;With upper extremity assist;With armrests;From chair/3-in-1 Stand to Sit: 7: Independent;With armrests;With upper extremity assist;To chair/3-in-1 Ambulation/Gait Ambulation/Gait Assistance: 5: Supervision Ambulation Distance (Feet): 300 Feet Assistive device: None Ambulation/Gait Assistance Details: Performed DGI.   Gait Pattern: Within Functional Limits Gait velocity: WFL      PT Goals Acute Rehab PT Goals Time For Goal Achievement: 04/23/13 Potential to Achieve Goals: Good Pt will Ambulate: >150 feet;Independently PT Goal: Ambulate - Progress: Progressing toward goal Pt will Go Up / Down Stairs: 3-5 stairs;with modified independence;with rail(s) Additional Goals Additional Goal #1: Patient will score > 19/24 on DGI  balance assessment to indicate low fall risk. PT Goal: Additional Goal #1 - Progress: Met  Visit Information  Last PT Received On: 04/19/13 Assistance Needed: +1    Subjective Data  Patient Stated Goal: To go home   Cognition  Cognition Arousal/Alertness: Awake/alert Behavior During Therapy: WFL for tasks assessed/performed Overall Cognitive Status: Within Functional Limits for tasks assessed    Balance  Standardized Balance Assessment Standardized Balance Assessment: Dynamic Gait Index Dynamic Gait Index Level Surface: Normal Change in Gait Speed: Normal Gait with Horizontal Head Turns: Normal Gait with Vertical Head Turns: Normal Gait and Pivot Turn: Normal Step Over Obstacle: Normal Step Around Obstacles: Normal  End of Session PT - End of Session Equipment Utilized During Treatment: Gait belt;Oxygen Activity Tolerance: Patient tolerated treatment well Patient left: in chair;with call bell/phone within reach Nurse Communication: Mobility status; pt to ambulate BID     Sarajane Marek, PTA [phone] 04/19/2013

## 2013-04-19 NOTE — Progress Notes (Signed)
Patient ID: Russell Bowers, male   DOB: 06-27-1951, 62 y.o.   MRN: [id number] S:feels better today. Tolerated BiPap well last night. O:BP 104/68  Pulse 98  Temp(Src) 98.4 F (36.9 C) (Axillary)  Resp 25  Ht 5\' 9"  (1.753 m)  Wt 99 kg (218 lb 4.1 oz)  BMI 32.22 kg/m2  SpO2 95%  Intake/Output Summary (Last 24 hours) at 04/19/13 0942 Last data filed at 04/19/13 0659  Gross per 24 hour  Intake    843 ml  Output   2275 ml  Net  -1432 ml   Intake/Output: I/O last 3 completed shifts: In: 1203 [P.O.:1200; I.V.:3] Out: 3075 [Urine:3075]  Intake/Output this shift:    Weight change: 1.158 kg (2 lb 8.9 oz) Gen:WD WN obese WM in NAD CVS:no rub Resp:decreased BS at bases [id number] edema, LFA AVF +T/B   Recent Labs Lab 04/13/13 0500 04/14/13 0435 04/15/13 0526 04/16/13 0518 04/17/13 0551 04/18/13 0544 04/19/13 0705  NA 145 144 144 140 137 140 140  K 3.3* 3.7 4.3 4.4 5.1 4.9 4.8  CL 102 103 103 100 96 98 96  CO2 35* 35* 32 32 26 35* 39*  GLUCOSE 208* 179* 171* 165* 200* 218* 164*  BUN 63* 54* 44* 43* 45* 40* 42*  CREATININE 2.29* 2.17* 2.08* 2.16* 1.99* 1.94* 2.13*  ALBUMIN 2.9*  --  3.0* 3.1* 3.0* 3.1* 3.1*  CALCIUM 9.7 9.6 9.9 9.8 9.6 9.6 10.2  PHOS 2.7  --  5.0* 4.6 4.4 4.0 3.9  AST 12  --  9  --   --   --  16  ALT 37  --  20  --   --   --  21   Liver Function Tests:  Recent Labs Lab 04/13/13 0500 04/15/13 0526  04/17/13 0551 04/18/13 0544 04/19/13 0705  AST 12 9  --   --   --  16  ALT 37 20  --   --   --  21  ALKPHOS 99 96  --   --   --  75  BILITOT 0.6 0.3  --   --   --  0.5  PROT 5.6* 6.1  --   --   --  6.2  ALBUMIN 2.9* 3.0*  < > 3.0* 3.1* 3.1*  < > = values in this interval not displayed. No results found for this basename: LIPASE, AMYLASE,  in the last 168 hours No results found for this basename: AMMONIA,  in the last 168 hours CBC:  Recent Labs Lab 04/15/13 0526 04/16/13 0518 04/17/13 0551 04/18/13 0544 04/19/13 0705  WBC 8.6 9.0  9.8 9.2 7.0  HGB 12.5* 12.4* 13.0 12.8* 12.6*  HCT 41.7 41.6 43.3 44.5 42.8  MCV 93.1 93.3 94.7 96.9 95.1  PLT 153 144* 144* 153 147*   Cardiac Enzymes:  Recent Labs Lab 04/12/13 1312 04/12/13 1653 04/12/13 2105 04/12/13 2240  CKTOTAL  --  23  --  19  CKMB  --  1.9  --  1.5  TROPONINI 0.79*  --  0.48*  --    CBG:  Recent Labs Lab 04/18/13 1058 04/18/13 1852 04/18/13 2126 04/18/13 2334 04/19/13 0757  GLUCAP 241* 150* 224* 202* 151*    Iron Studies: No results found for this basename: IRON, TIBC, TRANSFERRIN, FERRITIN,  in the last 72 hours Studies/Results: No results found. [name] aspirin EC  325 mg Oral Daily  . furosemide  160 mg Oral TID  . heparin subcutaneous  5,000 Units Subcutaneous  Q8H  . insulin aspart  0-15 Units Subcutaneous TID AC & HS  . insulin glargine  15 Units Subcutaneous QHS  . metoprolol tartrate  50 mg Oral BID  . mycophenolate  360 mg Oral BID  . pantoprazole  40 mg Oral QHS  . sodium chloride  3 mL Intravenous Q12H  . tacrolimus  3 mg Oral BID    BMET    Component Value Date/Time   NA 140 04/19/2013 0705   K 4.8 04/19/2013 0705   CL 96 04/19/2013 0705   CO2 39* 04/19/2013 0705   GLUCOSE 164* 04/19/2013 0705   BUN 42* 04/19/2013 0705   CREATININE 2.13* 04/19/2013 0705   CALCIUM 10.2 04/19/2013 0705   GFRNONAA 32* 04/19/2013 0705   GFRAA 37* 04/19/2013 0705   CBC    Component Value Date/Time   WBC 7.0 04/19/2013 0705   RBC 4.50 04/19/2013 0705   HGB 12.6* 04/19/2013 0705   HCT 42.8 04/19/2013 0705   PLT 147* 04/19/2013 0705   MCV 95.1 04/19/2013 0705   MCH 28.0 04/19/2013 0705   MCHC 29.4* 04/19/2013 0705   RDW 15.7* 04/19/2013 0705   LYMPHSABS 0.3* 04/11/2013 1155   MONOABS 0.6 04/11/2013 1155   EOSABS 0.0 04/11/2013 1155   BASOSABS 0.0 04/11/2013 1155     Assessment/Plan:  1. AKI/CKD- s/p cad renal tx with baseline creat around 1.8-2.3 at baseline.  Scr up somewhat but diuresed 1.3L yesterday with zaroxolyn.  Will hold further zaroxolyn and  cont with current meds 2. hyeprcarbic respiratory failure- appreciate PCCM assistance.  Will need to have outpt BiPap arranged and f/u with [facility] pulmonology 3. ARF/H flu PNA 4. DM- agree with increase in lantus 5. OHS- per PCCM 6. CHF- diastolic 7. RBBB 8. Metabolic alkalosis- due to hypercapnic pulm disease 9. Immunosuppressive therapy- tacrolimus level therapeutic. 10. dispo- will need outpt BiPap equipment and possible O2 per PCCM  Katielynn Horan A

## 2013-04-19 NOTE — Progress Notes (Signed)
PULMONARY  / CRITICAL CARE MEDICINE  Name: Russell Bowers MRN: [id number] DOB: 26-Oct-1951    ADMISSION DATE:  04/11/2013   BRIEF PATIENT DESCRIPTION:  70 male with hx of renal transplant 11/2011, intubated by PCCM in ED for acute on chronic hypercarbic respiratory failure after 1-2 months of cough, atypical chest pain and marked increase in DOE. Bilateral pulmonary infiltrates on CXR. Very similar admission in Feb 2013 @ [place].   SIGNIFICANT EVENTS / STUDIES:  4/20 admitted with hypercarbic respiratory failure, pulmonary infiltrates, acute on chronic renal insuff, RBBB 4/22 extubated  LINES / TUBES: ETT 4/20 >> 4/22 R IJ CVL 4/20 >>4/23   CULTURES: MRSA PCR 4/20 >> NEG Strep Ag 4/20 >> NEG Legionella Ag 4/20 >>Negative  Streptococcal Urine Ag 4/20 >> negative   Urine 4/20 >> neg  Resp virus panel 4/20 >> negative  PJP DFA 4/20 >> negative  BAL cx 4/20 >> H. Influenzae, beta lactam neg Fungal respiratory cx 4/21 >> AFB resp. cx 4/21 >> AFB smear - negative  Blood 4/20 >> neg   ANTIBIOTICS: ABX per ID  Vanc 4/20 >> 4/23 Ceftaz 4/20 >> 4/26 Levoflox 4/20 >> 4/21  Flagyl 4/21>>4/23 augmentin (per ID) 4/24>>4/26  SUBJECTIVE:  Awake, alert and interactive, following commands.   VITAL SIGNS: Temp:  [97.5 F (36.4 C)-98.8 F (37.1 C)] 98.4 F (36.9 C) (04/28 0758) Pulse Rate:  [79-125] 98 (04/28 0850) Resp:  [17-37] 25 (04/28 0758) BP: (104-139)/(55-75) 104/68 mmHg (04/28 0850) SpO2:  [90 %-100 %] 95 % (04/28 0758) Weight:  [99 kg (218 lb 4.1 oz)-102.9 kg (226 lb 13.7 oz)] 99 kg (218 lb 4.1 oz) (04/28 0513) 2L [place]   INTAKE / OUTPUT: Intake/Output     04/27 0701 - 04/28 0700 04/28 0701 - 04/29 0700   P.O. 1200    I.V. (mL/kg) 3 (0)    Total Intake(mL/kg) 1203 (12.2)    Urine (mL/kg/hr) 2475 (1)    Total Output 2475     Net -1272          Urine Occurrence 1 x     PHYSICAL EXAMINATION: General: obese male in nad.   Neuro:  Awake, alert, interactive and moves  all extremities HEENT:  Bilevel in place Neck without TMG or LN Cardiovascular:  RRR, 2/6 sem Lungs: decreased bs in bases , o/w clear Abdomen:  obese, soft, NT LE with 2+ edema, no cyanosis  LABS:  Recent Labs Lab 04/17/13 0551 04/18/13 0544 04/19/13 0705  HGB 13.0 12.8* 12.6*  HCT 43.3 44.5 42.8  WBC 9.8 9.2 7.0  PLT 144* 153 147*   Recent Labs Lab 04/14/13 0435 04/15/13 0526 04/16/13 0518 04/17/13 0551 04/18/13 0544 04/19/13 0705  NA 144 144 140 137 140 140  K 3.7 4.3 4.4 5.1 4.9 4.8  CL 103 103 100 96 98 96  CO2 35* 32 32 26 35* 39*  GLUCOSE 179* 171* 165* 200* 218* 164*  BUN 54* 44* 43* 45* 40* 42*  CREATININE 2.17* 2.08* 2.16* 1.99* 1.94* 2.13*  CALCIUM 9.6 9.9 9.8 9.6 9.6 10.2  MG 1.9  --   --   --   --   --   PHOS  --  5.0* 4.6 4.4 4.0 3.9   Recent Labs Lab 04/13/13 0500 04/15/13 0526 04/16/13 0518 04/17/13 0551 04/18/13 0544 04/19/13 0705  AST 12 9  --   --   --  16  ALT 37 20  --   --   --  21  ALKPHOS 99 96  --   --   --  75  BILITOT 0.6 0.3  --   --   --  0.5  PROT 5.6* 6.1  --   --   --  6.2  ALBUMIN 2.9* 3.0* 3.1* 3.0* 3.1* 3.1*   Recent Labs Lab 04/18/13 0600 04/18/13 1058 04/18/13 1852 04/18/13 2126 04/18/13 2334  GLUCAP 211* 241* 150* 224* 202*   CXR:  4/25: mild infiltrates, low lung volumes  ASSESSMENT / PLAN: Principal Problem:   Acute respiratory failure with hypercapnia Active Problems:   Pulmonary infiltrates   RBBB   History of renal transplantation   Acute on chronic renal failure   Immunocompromised   DM2 (diabetes mellitus, type 2)   Encephalopathy acute   Acute diastolic CHF (congestive heart failure)   Hypokalemia   Demand ischemia of myocardium   Protein-calorie malnutrition, severe   Pneumonia due to Hemophilus influenzae (H. influenzae)   Obesity, unspecified  PULMONARY A: Acute hypercarbic resp failure -- Resolved  HCAP-- H flu (B lactam NEG) Pulmonary infiltrates - edema vs infection  Now with  worsening hypercarbia In hindsite, I think the pt has chronic hypercarbia (bicarb in 30's earlier this year even in the face of CRI), and more than likely this is due to OHS.  He also has a h/o osa and was unable to tolerate cpap at home [facility])   P:   - Keep on bipap qhs. - OSA by history but patient returned CPAP prescription, CSW to attempt and get it back as mental status are much better when patient uses BiPAP at night (less hypercarbia). - Minimize oxygen to keep sats 90% - Taper prednisone down.  CARDIOVASCULAR A: Elevated BNP 16846 ( with Renal insufficiency) RBBB - unclear whether old or new Possible demand ischemia, with transient elevation in troponins-- ECG c/w acute ischemia with rising trop levels. Echo with no WMA, EF 60-65% Tachycardia and Nonsustained PVC - myoview unremarkable.   RENAL A:  Hx of renal transplant Acute on chronic renal insuff.  Hypokalemia - Improved.  P:   - Renal following  - Monitor BMET - Cont lasix and zaroxolyn per renal   INFECTIOUS A:  Immunosuppressed Pulmonary infiltrates,d/t H Parainfluenza PNA.   Hx of back abscess with Peptostreptococcus.   P:   - Off abx and doing well. - Fungal and AFB culture are negative. - Will f/u with surgery as outpt for ?further debridement.  ENDOCRINE A:  DM2 Poor glycemic control P:   - Increase hs lantus, continue ssi.  Rush Farmer, M.D. [facility] Pulmonary/Critical Care Medicine. Pager: [phone]. After hours pager: [phone].

## 2013-04-20 ENCOUNTER — Inpatient Hospital Stay (HOSPITAL_COMMUNITY): Payer: Medicare Other

## 2013-04-20 LAB — CBC
Platelets: 181 10*3/uL (ref 150–400)
RDW: 15.7 % — ABNORMAL HIGH (ref 11.5–15.5)
WBC: 7.3 10*3/uL (ref 4.0–10.5)

## 2013-04-20 LAB — MAGNESIUM: Magnesium: 1.9 mg/dL (ref 1.5–2.5)

## 2013-04-20 LAB — RENAL FUNCTION PANEL
CO2: 40 mEq/L (ref 19–32)
Chloride: 90 mEq/L — ABNORMAL LOW (ref 96–112)
GFR calc Af Amer: 33 mL/min — ABNORMAL LOW (ref >90)
GFR calc non Af Amer: 29 mL/min — ABNORMAL LOW (ref >90)
Sodium: 138 mEq/L (ref 135–145)

## 2013-04-20 LAB — GLUCOSE, CAPILLARY: Glucose-Capillary: 220 mg/dL — ABNORMAL HIGH (ref 70–99)

## 2013-04-20 NOTE — Progress Notes (Signed)
Inpatient Diabetes Program Recommendations  AACE/ADA: New Consensus Statement on Inpatient Glycemic Control (2013)  Target Ranges:  Prepandial:   less than 140 mg/dL      Peak postprandial:   less than 180 mg/dL (1-2 hours)      Critically ill patients:  140 - 180 mg/dL  Results for Russell Bowers, Russell Bowers (MRN [id number]) as of 04/20/2013 12:50  Ref. Range 04/19/2013 11:48 04/19/2013 16:52 04/19/2013 22:25 04/20/2013 09:06 04/20/2013 12:00  Glucose-Capillary Latest Range: 70-99 mg/dL 243 (H) 263 (H) 260 (H) 220 (H) 237 (H)   Inpatient Diabetes Program Recommendations Insulin - Meal Coverage: consider adding Novolog 3 units TID with meals for postprandial elevations Thank you  Raoul Pitch BSN, RN,CDE Inpatient Diabetes Coordinator [phone] (team pager)

## 2013-04-20 NOTE — Progress Notes (Signed)
Patient ID: ELEASAR KIEL, male   DOB: [birth year], 62 y.o.   MRN: [id number] S:feels better O:BP 103/54  Pulse 126  Temp(Src) 98.1 F (36.7 C) (Oral)  Resp 24  Ht 5\' 9"  (1.753 m)  Wt 98.5 kg (217 lb 2.5 oz)  BMI 32.05 kg/m2  SpO2 95%  Intake/Output Summary (Last 24 hours) at 04/20/13 0928 Last data filed at 04/20/13 0900  Gross per 24 hour  Intake    483 ml  Output   2000 ml  Net  -1517 ml   Intake/Output: I/O last 3 completed shifts: In: 366 [P.O.:360; I.V.:6] Out: 3525 [Urine:3525]  Intake/Output this shift:  Total I/O In: 240 [P.O.:240] Out: -  Weight change: -4.4 kg (-9 lb 11.2 oz) Gen:WD WN WM in NAD CVS:no rub Resp:scattered rhonchi with poor air movement [id number] pretib edema   Recent Labs Lab 04/14/13 0435 04/15/13 0526 04/16/13 0518 04/17/13 0551 04/18/13 0544 04/19/13 0705 04/20/13 0600  NA 144 144 140 137 140 140 138  K 3.7 4.3 4.4 5.1 4.9 4.8 4.3  CL 103 103 100 96 98 96 90*  CO2 35* 32 32 26 35* 39* 40*  GLUCOSE 179* 171* 165* 200* 218* 164* 171*  BUN 54* 44* 43* 45* 40* 42* 53*  CREATININE 2.17* 2.08* 2.16* 1.99* 1.94* 2.13* 2.32*  ALBUMIN  --  3.0* 3.1* 3.0* 3.1* 3.1* 3.3*  CALCIUM 9.6 9.9 9.8 9.6 9.6 10.2 10.5  PHOS  --  5.0* 4.6 4.4 4.0 3.9 3.7  AST  --  9  --   --   --  16  --   ALT  --  20  --   --   --  21  --    Liver Function Tests:  Recent Labs Lab 04/15/13 0526  04/18/13 0544 04/19/13 0705 04/20/13 0600  AST 9  --   --  16  --   ALT 20  --   --  21  --   ALKPHOS 96  --   --  75  --   BILITOT 0.3  --   --  0.5  --   PROT 6.1  --   --  6.2  --   ALBUMIN 3.0*  < > 3.1* 3.1* 3.3*  < > = values in this interval not displayed. No results found for this basename: LIPASE, AMYLASE,  in the last 168 hours No results found for this basename: AMMONIA,  in the last 168 hours CBC:  Recent Labs Lab 04/16/13 0518 04/17/13 0551 04/18/13 0544 04/19/13 0705 04/20/13 0600  WBC 9.0 9.8 9.2 7.0 7.3  HGB 12.4* 13.0  12.8* 12.6* 12.8*  HCT 41.6 43.3 44.5 42.8 42.5  MCV 93.3 94.7 96.9 95.1 93.0  PLT 144* 144* 153 147* 181   Cardiac Enzymes: No results found for this basename: CKTOTAL, CKMB, CKMBINDEX, TROPONINI,  in the last 168 hours CBG:  Recent Labs Lab 04/18/13 2334 04/19/13 0757 04/19/13 1148 04/19/13 1652 04/19/13 2225  GLUCAP 202* 151* 243* 263* 260*    Iron Studies: No results found for this basename: IRON, TIBC, TRANSFERRIN, FERRITIN,  in the last 72 hours Studies/Results: Dg Chest Port 1 View  04/20/2013  *RADIOLOGY REPORT*  Clinical Data: Respiratory failure, shortness of breath.  PORTABLE CHEST - 1 VIEW  Comparison: 04/16/2013  Findings: Low lung volumes with bilateral lower lobe airspace opacities.  Small effusions.  Vascular congestion and borderline heart size.  No real change since prior study.  IMPRESSION: Stable bibasilar opacities  and bilateral effusions.  Low lung volumes with vascular congestion.   Original Report Authenticated By: Rolm Baptise, M.D.    . aspirin EC  325 mg Oral Daily  . furosemide  160 mg Oral TID  . heparin subcutaneous  5,000 Units Subcutaneous Q8H  . insulin aspart  0-15 Units Subcutaneous TID AC & HS  . insulin glargine  15 Units Subcutaneous QHS  . metoprolol tartrate  50 mg Oral BID  . mycophenolate  360 mg Oral BID  . pantoprazole  40 mg Oral QHS  . sodium chloride  3 mL Intravenous Q12H  . tacrolimus  3 mg Oral BID    BMET    Component Value Date/Time   NA 138 04/20/2013 0600   K 4.3 04/20/2013 0600   CL 90* 04/20/2013 0600   CO2 40* 04/20/2013 0600   GLUCOSE 171* 04/20/2013 0600   BUN 53* 04/20/2013 0600   CREATININE 2.32* 04/20/2013 0600   CALCIUM 10.5 04/20/2013 0600   GFRNONAA 29* 04/20/2013 0600   GFRAA 33* 04/20/2013 0600   CBC    Component Value Date/Time   WBC 7.3 04/20/2013 0600   RBC 4.57 04/20/2013 0600   HGB 12.8* 04/20/2013 0600   HCT 42.5 04/20/2013 0600   PLT 181 04/20/2013 0600   MCV 93.0 04/20/2013 0600   MCH 28.0 04/20/2013  0600   MCHC 30.1 04/20/2013 0600   RDW 15.7* 04/20/2013 0600   LYMPHSABS 0.3* 04/11/2013 1155   MONOABS 0.6 04/11/2013 1155   EOSABS 0.0 04/11/2013 1155   BASOSABS 0.0 04/11/2013 1155    Assessment/Plan:  1. AKI/CKD- s/p cad renal tx with baseline creat around 1.8-2.3 at baseline. Scr up somewhat but diuresed 3.3L over the last 48hours.  Will hold further zaroxolyn and cont with current meds 2. hyeprcarbic respiratory failure- appreciate PCCM assistance. Will need to have outpt C-Pap arranged and f/u with [facility] pulmonology 3. ARF/H flu PNA 4. DM- agree with increase in lantus 5. OHS- per PCCM 6. CHF- diastolic. Marked improvement and diuresed 5kg since admission.   7. RBBB 8. Metabolic alkalosis- due to hypercapnic pulm disease 9. Immunosuppressive therapy- tacrolimus level therapeutic. 10. dispo- will need outpt C-Pap equipment and possible O2 per PCCM and possible discharge later today.  Will follow up with Dr. Jimmy Footman on 04/30/13 as previously scheduled. [place]

## 2013-04-20 NOTE — Progress Notes (Signed)
PULMONARY  / CRITICAL CARE MEDICINE  Name: Russell Bowers MRN: [id number] DOB: [birth year]    ADMISSION DATE:  04/11/2013   BRIEF PATIENT DESCRIPTION:  46 male with hx of renal transplant 11/2011, intubated by PCCM in ED for acute on chronic hypercarbic respiratory failure after 1-2 months of cough, atypical chest pain and marked increase in DOE. Bilateral pulmonary infiltrates on CXR. Very similar admission in Feb 2013 @ [place].   SIGNIFICANT EVENTS / STUDIES:  4/20 admitted with hypercarbic respiratory failure, pulmonary infiltrates, acute on chronic renal insuff, RBBB 4/22 extubated  LINES / TUBES: ETT 4/20 >> 4/22 R IJ CVL 4/20 >>4/23   CULTURES: MRSA PCR 4/20 >> NEG Strep Ag 4/20 >> NEG Legionella Ag 4/20 >>Negative  Streptococcal Urine Ag 4/20 >> negative   Urine 4/20 >> neg  Resp virus panel 4/20 >> negative  PJP DFA 4/20 >> negative  BAL cx 4/20 >> H. Influenzae, beta lactam neg Fungal respiratory cx 4/21 >> AFB resp. cx 4/21 >> AFB smear - negative  Blood 4/20 >> neg   ANTIBIOTICS: ABX per ID  Vanc 4/20 >> 4/23 Ceftaz 4/20 >> 4/26 Levoflox 4/20 >> 4/21  Flagyl 4/21>>4/23 augmentin (per ID) 4/24>>4/26  SUBJECTIVE:  Awake, alert and interactive, following commands.   VITAL SIGNS: Temp:  [97.6 F (36.4 C)-98.8 F (37.1 C)] 97.6 F (36.4 C) (04/29 0331) Pulse Rate:  [80-130] 87 (04/29 0333) Resp:  [24-40] 29 (04/29 0333) BP: (98-130)/(53-68) 130/61 mmHg (04/29 0331) SpO2:  [96 %-100 %] 98 % (04/29 0333) FiO2 (%):  [40 %] 40 % (04/29 0333) Weight:  [98.5 kg (217 lb 2.5 oz)] 98.5 kg (217 lb 2.5 oz) (04/29 0331) 2L [place]   INTAKE / OUTPUT: Intake/Output     04/28 0701 - 04/29 0700 04/29 0701 - 04/30 0700   P.O. 240    I.V. (mL/kg) 3 (0)    Total Intake(mL/kg) 243 (2.5)    Urine (mL/kg/hr) 2000 (0.8)    Total Output 2000     Net -1757          Urine Occurrence 3 x     PHYSICAL EXAMINATION: General: obese male in nad.   Neuro:  Awake, alert,  interactive and moves all extremities HEENT:  Bilevel in place Neck without TMG or LN Cardiovascular:  RRR, 2/6 sem Lungs: decreased bs in bases , o/w clear Abdomen:  obese, soft, NT LE with 2+ edema, no cyanosis  LABS:  Recent Labs Lab 04/18/13 0544 04/19/13 0705 04/20/13 0600  HGB 12.8* 12.6* 12.8*  HCT 44.5 42.8 42.5  WBC 9.2 7.0 7.3  PLT 153 147* 181   Recent Labs Lab 04/14/13 0435  04/16/13 0518 04/17/13 0551 04/18/13 0544 04/19/13 0705 04/20/13 0600  NA 144  < > 140 137 140 140 138  K 3.7  < > 4.4 5.1 4.9 4.8 4.3  CL 103  < > 100 96 98 96 90*  CO2 35*  < > 32 26 35* 39* 40*  GLUCOSE 179*  < > 165* 200* 218* 164* 171*  BUN 54*  < > 43* 45* 40* 42* 53*  CREATININE 2.17*  < > 2.16* 1.99* 1.94* 2.13* 2.32*  CALCIUM 9.6  < > 9.8 9.6 9.6 10.2 10.5  MG 1.9  --   --   --   --   --  1.9  PHOS  --   < > 4.6 4.4 4.0 3.9 3.7  < > = values in this interval not displayed.  Recent  Labs Lab 04/15/13 0526 04/16/13 0518 04/17/13 0551 04/18/13 0544 04/19/13 0705 04/20/13 0600  AST 9  --   --   --  16  --   ALT 20  --   --   --  21  --   ALKPHOS 96  --   --   --  75  --   BILITOT 0.3  --   --   --  0.5  --   PROT 6.1  --   --   --  6.2  --   ALBUMIN 3.0* 3.1* 3.0* 3.1* 3.1* 3.3*    Recent Labs Lab 04/18/13 2334 04/19/13 0757 04/19/13 1148 04/19/13 1652 04/19/13 2225  GLUCAP 202* 151* 243* 263* 260*   CXR:  4/25: mild infiltrates, low lung volumes  ASSESSMENT / PLAN: Principal Problem:   Acute respiratory failure with hypercapnia Active Problems:   Pulmonary infiltrates   RBBB   History of renal transplantation   Acute on chronic renal failure   Immunocompromised   DM2 (diabetes mellitus, type 2)   Encephalopathy acute   Acute diastolic CHF (congestive heart failure)   Hypokalemia   Demand ischemia of myocardium   Protein-calorie malnutrition, severe   Pneumonia due to Hemophilus influenzae (H. influenzae)   Obesity, unspecified  PULMONARY A:  Acute hypercarbic resp failure -- Resolved  HCAP-- H flu (B lactam NEG) Pulmonary infiltrates - edema vs infection  Now with worsening hypercarbia In hindsite, I think the pt has chronic hypercarbia (bicarb in 30's earlier this year even in the face of CRI), and more than likely this is due to OHS.  He also has a h/o osa and was unable to tolerate cpap at home [facility])   P:   - Switch to CPAP at night. - OSA history, [facility] in [facility], papers signed to get patient CPAP machine. - Minimize oxygen to keep sats 90% - D/C prednisone.  CARDIOVASCULAR A: Elevated BNP 16846 ( with Renal insufficiency) RBBB - unclear whether old or new Possible demand ischemia, with transient elevation in troponins-- ECG c/w acute ischemia with rising trop levels. Echo with no WMA, EF 60-65% Tachycardia and Nonsustained PVC - Myoview unremarkable.  [facility] cardiology today for final recommendations prior to discharge.  RENAL A:  Hx of renal transplant Acute on chronic renal insuff.  Hypokalemia - Improved.  P:   - Renal following, further recommendations per renal. - Monitor BMET. - Cont lasix and zaroxolyn per renal.  INFECTIOUS A:  Immunosuppressed Pulmonary infiltrates,d/t H Parainfluenza PNA.   Hx of back abscess with Peptostreptococcus.   P:   - Off abx and doing well. - Fungal and AFB culture are negative. - Will f/u with surgery as outpt for ?further debridement.  ENDOCRINE A:  DM2 Poor glycemic control P:   - Increased hs lantus, continue ssi.  Awaiting input from cards and if we are able to qualify patient for O2 and for CPAP.  If able to get both today will d/c, if not then will keep and d/c in AM.  Rush Farmer, M.D. [facility] Pulmonary/Critical Care Medicine. Pager: [phone]. After hours pager: [phone].

## 2013-04-20 NOTE — Progress Notes (Signed)
SATURATION QUALIFICATIONS: (This note is used to comply with regulatory documentation for home oxygen)  Patient Saturations on Room Air at Rest = 82%  Patient Saturations on Room Air while Ambulating = N/A  Patient Saturations on N/A Liters of oxygen while Ambulating = N/A  Please briefly explain why patient needs home oxygen:  Hypoxic @ rest on RA, O2 sat 95% when placed on O2 @ 2L/min [place]

## 2013-04-21 LAB — GLUCOSE, CAPILLARY
Glucose-Capillary: 148 mg/dL — ABNORMAL HIGH (ref 70–99)
Glucose-Capillary: 167 mg/dL — ABNORMAL HIGH (ref 70–99)

## 2013-04-21 MED ORDER — FUROSEMIDE 80 MG PO TABS: 160.0000 mg | ORAL_TABLET | Freq: Three times a day (TID) | ORAL | Status: DC

## 2013-04-21 MED ORDER — ACETAMINOPHEN 325 MG PO TABS
650.0000 mg | ORAL_TABLET | ORAL | Status: DC | PRN
  Administered 2013-04-21: 650 mg via ORAL
  Filled 2013-04-21 (×2): qty 2

## 2013-04-21 MED ORDER — INSULIN GLARGINE 100 UNIT/ML ~~LOC~~ SOLN: 15.0000 [IU] | Freq: Every day | SUBCUTANEOUS | Status: DC

## 2013-04-21 NOTE — Discharge Summary (Signed)
Physician Discharge Summary  Patient ID: Russell Bowers MRN: [id number] DOB/AGE: 03/27/1951 62 y.o.  Admit date: 04/11/2013 Discharge date: 04/21/2013    Discharge Diagnoses:  Principal Problem:   Acute respiratory failure with hypercapnia Active Problems:   Pulmonary infiltrates   RBBB   History of renal transplantation   Acute on chronic renal failure   Immunocompromised   DM2 (diabetes mellitus, type 2)   Encephalopathy acute   Acute diastolic CHF (congestive heart failure)   Hypokalemia   Demand ischemia of myocardium   Protein-calorie malnutrition, severe   Pneumonia due to Hemophilus influenzae (H. influenzae)   Obesity, unspecified    Brief Summary: Russell Bowers is a 62 y.o. y/o male with a PMH of renal transplant 11/2011 admitted 4/20 with acute hypercarbic resp failure in setting H Flu HCAP (similar recent admit February at [facility]) and likely decompensated OSA.  Required intubation initially. Course c/b acute on chronic renal insufficiency, poor glycemic control and back abscess with hx peptostreptococcus.   Consults: Cardiology - [facility]  Renal - [place] Infectious disease - [facility]   SIGNIFICANT EVENTS / STUDIES:  4/20 admitted with hypercarbic respiratory failure, pulmonary infiltrates, acute on chronic renal insuff, RBBB  4/21 2D echo>>>EF60-65%, mild aortic stenosis, mild MVR, mildly dilated LA, NO diagnostic regional wall motion abnormality 4/22 extubated   LINES / TUBES:  ETT 4/20 >> 4/22  R IJ CVL 4/20 >>4/23   CULTURES:  MRSA PCR 4/20 >> NEG  Strep Ag 4/20 >> NEG  Legionella Ag 4/20 >>Negative  Streptococcal Urine Ag 4/20 >> negative  Urine 4/20 >> neg  Resp virus panel 4/20 >> negative  PJP DFA 4/20 >> negative  BAL cx 4/20 >> H. Influenzae, beta lactam neg  Fungal respiratory cx 4/21 >>  AFB resp. cx 4/21 >>  AFB smear - negative  Blood 4/20 >> neg   ANTIBIOTICS:  ABX per ID  Vanc 4/20 >> 4/23  Ceftaz 4/20 >> 4/26  Levoflox 4/20  >> 4/21  Flagyl 4/21>>4/23  augmentin (per ID) 4/24>>4/26                                                                      Hospital Summary by Discharge Diagnosis Acute hypercarbic respiratory failure - multifactorial in setting H Flu HCAP (recent hospital admission) likely c/b decompensated OSA.  Required intubation initially and was treated with broad spectrum abx per ID. Extubated after 48 hours.  He is still requiring continuous O2 with a RA sat of 82%.  Likely chronic hypercarbia. See below.   OSA - previous sleep study done confirming this at [facility] but pt never received CPAP machine.  Will d/c with home auto CPAP. F/u with PCP  Elevated BNP  Transient troponin elevation  Tachycardia/ RBBB 2D echo as above.  Seen in consultation by cardiology.  Felt likely demand ischemic, no heparin given in setting no wall motion abnormality.  Diuresed gently and metoprolol adjusted. Diuresed approx 5kg since admit. Myoview unremarkable. Will f/u with cards as outpt for further w/u.   Hx renal transplant 2012  Acute on chronic renal insufficiency -- baseline SCr 1.8-2.3 Followed closely by renal who adjusted diuretics.  Zaroxolyn used but now off.  Will d/c on lasix alone.  Cont  prograf and f/u with Dr. Jimmy Footman as outpt as below.   DM - blood sugars were difficult to control.  Lantus increased and SSI utilized. Will need further f/u with PCP as outpt.   HFlu HCAP  Back abscess with hx peptostreptococcus  Abx as above.  Back abscess not acute issue.  Seen by gen surgery who felt no need for intervention this admit.  ID consulted for abx recs given immunosuppression.  Abx now complete.  He will f/u with gen surgery as outpt for back abscess.   Filed Vitals:   04/21/13 0011 04/21/13 0334 04/21/13 0406 04/21/13 0800  BP: 119/64 121/60 121/60 95/53  Pulse: 80 85 115 91  Temp: 97.9 F (36.6 C) 97.1 F (36.2 C) 97.5 F (36.4 C) 97.8 F (36.6 C)  TempSrc: Axillary Axillary Axillary Oral   Resp: 26 21 22 20   Height:      Weight:   217 lb 2.5 oz (98.5 kg)   SpO2: 98% 98% 98% 95%     Discharge Labs  BMET  Recent Labs Lab 04/16/13 0518 04/17/13 0551 04/18/13 0544 04/19/13 0705 04/20/13 0600  NA 140 137 140 140 138  K 4.4 5.1 4.9 4.8 4.3  CL 100 96 98 96 90*  CO2 32 26 35* 39* 40*  GLUCOSE 165* 200* 218* 164* 171*  BUN 43* 45* 40* 42* 53*  CREATININE 2.16* 1.99* 1.94* 2.13* 2.32*  CALCIUM 9.8 9.6 9.6 10.2 10.5  MG  --   --   --   --  1.9  PHOS 4.6 4.4 4.0 3.9 3.7     CBC   Recent Labs Lab 04/18/13 0544 04/19/13 0705 04/20/13 0600  HGB 12.8* 12.6* 12.8*  HCT 44.5 42.8 42.5  WBC 9.2 7.0 7.3  PLT 153 147* 181   Anti-Coagulation No results found for this basename: INR,  in the last 168 hours   Discharge Orders   Future Orders Complete By Expires     Diet - low sodium heart healthy  As directed     Diet Carb Modified  As directed     Discharge instructions  As directed     Comments:      Continuous O2 @ 2L [place]    Increase activity slowly  As directed            Follow-up Information   Follow up with DETERDING,JAMES L, MD On 04/30/2013. (9:30am )    Contact information:   [place] [address] [phone]       Follow up with Jettie Booze., MD On 05/03/2013. (11:30am )    Contact information:   [place] [address] [phone]       Follow up with Gennette Pac, MD On 04/26/2013. (1:30pm )    Contact information:   [place] [address] [phone]       Follow up with Madilyn Hook DAVID, DO. Schedule an appointment as soon as possible for a visit in 2 weeks.   Contact information:   [address] [place] [address] [phone]          Medication List    STOP taking these medications       allopurinol 300 MG tablet  Commonly known as:  ZYLOPRIM     calcitRIOL 0.25 MCG capsule  Commonly known as:   ROCALTROL     cinacalcet 30 MG tablet  Commonly known as:  SENSIPAR     glipiZIDE 10 MG tablet  Commonly known as:  GLUCOTROL      TAKE these medications       aspirin EC 81 MG tablet  Take 81 mg by mouth every other day.     B-D ULTRAFINE III SHORT PEN 31G X 8 MM Misc  Generic drug:  Insulin Pen Needle     cetirizine 10 MG tablet  Commonly known as:  ZYRTEC  Take 10 mg by mouth daily.     furosemide 80 MG tablet  Commonly known as:  LASIX  Take 2 tablets (160 mg total) by mouth 3 (three) times daily.     insulin glargine 100 UNIT/ML injection  Commonly known as:  LANTUS  Inject 0.15 mLs (15 Units total) into the skin at bedtime.     insulin regular 100 units/mL injection  Commonly known as:  NOVOLIN R,HUMULIN R  Inject 2-8 Units into the skin 3 (three) times daily as needed. Takes as needed per sliding scale     metoprolol 50 MG tablet  Commonly known as:  LOPRESSOR  Take 50 mg by mouth 2 (two) times daily.     mycophenolate 180 MG EC tablet  Commonly known as:  MYFORTIC  Take 360 mg by mouth 2 (two) times daily.     omeprazole 20 MG capsule  Commonly known as:  PRILOSEC  Take 20 mg by mouth 2 (two) times daily.     pravastatin 80 MG tablet  Commonly known as:  PRAVACHOL  Take 80 mg by mouth daily.     sodium chloride 0.65 % nasal spray  Commonly known as:  OCEAN  Place 3-4 sprays into the nose at bedtime.     tacrolimus 1 MG capsule  Commonly known as:  PROGRAF  Take 3 mg by mouth 2 (two) times daily.         Disposition: Home   Discharged Condition: Russell Bowers has met maximum benefit of inpatient care and is medically stable and cleared for discharge.  Patient is pending follow up as above.      Time spent on disposition:  Greater than 35 minutes.   SignedDarlina Sicilian, NP 04/21/2013  10:06 AM Pager: [phone] or [phone]  *Care during the described time interval was provided by me and/or other providers on the  critical care team. I have reviewed this patient's available data, including medical history, events of note, physical examination and test results as part of my evaluation.    Patient seen and examined, agree with above note.  I dictated the care and orders written for this patient under my direction.  Rush Farmer, MD [phone]

## 2013-04-21 NOTE — Progress Notes (Signed)
Patient ID: Russell Bowers, male   DOB: [birth year], 62 y.o.   MRN: [id number] S:feels better and wants to go home.  Tolerated CPAP well O:BP 95/53  Pulse 91  Temp(Src) 97.8 F (36.6 C) (Oral)  Resp 20  Ht 5\' 9"  (1.753 m)  Wt 98.5 kg (217 lb 2.5 oz)  BMI 32.05 kg/m2  SpO2 95%  Intake/Output Summary (Last 24 hours) at 04/21/13 0848 Last data filed at 04/21/13 [id number]  Gross per 24 hour  Intake    363 ml  Output   1650 ml  Net  -1287 ml   Intake/Output: I/O last 3 completed shifts: In: 366 [P.O.:360; I.V.:6] Out: 3150 [Urine:3150]  Intake/Output this shift:    Weight change: 0 kg (0 lb) Gen:WD WN WM in NAD CVS:no rub Resp:CTA with decreased air movement in lower lobes [id number] Ext:LFA AVF +T/B, no pretib edema   Recent Labs Lab 04/15/13 0526 04/16/13 0518 04/17/13 0551 04/18/13 0544 04/19/13 0705 04/20/13 0600  NA 144 140 137 140 140 138  K 4.3 4.4 5.1 4.9 4.8 4.3  CL 103 100 96 98 96 90*  CO2 32 32 26 35* 39* 40*  GLUCOSE 171* 165* 200* 218* 164* 171*  BUN 44* 43* 45* 40* 42* 53*  CREATININE 2.08* 2.16* 1.99* 1.94* 2.13* 2.32*  ALBUMIN 3.0* 3.1* 3.0* 3.1* 3.1* 3.3*  CALCIUM 9.9 9.8 9.6 9.6 10.2 10.5  PHOS 5.0* 4.6 4.4 4.0 3.9 3.7  AST 9  --   --   --  16  --   ALT 20  --   --   --  21  --    Liver Function Tests:  Recent Labs Lab 04/15/13 0526  04/18/13 0544 04/19/13 0705 04/20/13 0600  AST 9  --   --  16  --   ALT 20  --   --  21  --   ALKPHOS 96  --   --  75  --   BILITOT 0.3  --   --  0.5  --   PROT 6.1  --   --  6.2  --   ALBUMIN 3.0*  < > 3.1* 3.1* 3.3*  < > = values in this interval not displayed. No results found for this basename: LIPASE, AMYLASE,  in the last 168 hours No results found for this basename: AMMONIA,  in the last 168 hours CBC:  Recent Labs Lab 04/16/13 0518 04/17/13 0551 04/18/13 0544 04/19/13 0705 04/20/13 0600  WBC 9.0 9.8 9.2 7.0 7.3  HGB 12.4* 13.0 12.8* 12.6* 12.8*  HCT 41.6 43.3 44.5 42.8 42.5  MCV 93.3 94.7  96.9 95.1 93.0  PLT 144* 144* 153 147* 181   Cardiac Enzymes: No results found for this basename: CKTOTAL, CKMB, CKMBINDEX, TROPONINI,  in the last 168 hours CBG:  Recent Labs Lab 04/20/13 1200 04/20/13 1622 04/20/13 2116 04/21/13 0010 04/21/13 0816  GLUCAP 237* 240* 319* 148* 167*    Iron Studies: No results found for this basename: IRON, TIBC, TRANSFERRIN, FERRITIN,  in the last 72 hours Studies/Results: Dg Chest Port 1 View  04/20/2013  *RADIOLOGY REPORT*  Clinical Data: Respiratory failure, shortness of breath.  PORTABLE CHEST - 1 VIEW  Comparison: 04/16/2013  Findings: Low lung volumes with bilateral lower lobe airspace opacities.  Small effusions.  Vascular congestion and borderline heart size.  No real change since prior study.  IMPRESSION: Stable bibasilar opacities and bilateral effusions.  Low lung volumes with vascular congestion.   Original Report Authenticated By:  Rolm Baptise, M.D.    . aspirin EC  325 mg Oral Daily  . furosemide  160 mg Oral TID  . heparin subcutaneous  5,000 Units Subcutaneous Q8H  . insulin aspart  0-15 Units Subcutaneous TID AC & HS  . insulin glargine  15 Units Subcutaneous QHS  . metoprolol tartrate  50 mg Oral BID  . mycophenolate  360 mg Oral BID  . pantoprazole  40 mg Oral QHS  . sodium chloride  3 mL Intravenous Q12H  . tacrolimus  3 mg Oral BID    BMET    Component Value Date/Time   NA 138 04/20/2013 0600   K 4.3 04/20/2013 0600   CL 90* 04/20/2013 0600   CO2 40* 04/20/2013 0600   GLUCOSE 171* 04/20/2013 0600   BUN 53* 04/20/2013 0600   CREATININE 2.32* 04/20/2013 0600   CALCIUM 10.5 04/20/2013 0600   GFRNONAA 29* 04/20/2013 0600   GFRAA 33* 04/20/2013 0600   CBC    Component Value Date/Time   WBC 7.3 04/20/2013 0600   RBC 4.57 04/20/2013 0600   HGB 12.8* 04/20/2013 0600   HCT 42.5 04/20/2013 0600   PLT 181 04/20/2013 0600   MCV 93.0 04/20/2013 0600   MCH 28.0 04/20/2013 0600   MCHC 30.1 04/20/2013 0600   RDW 15.7* 04/20/2013 0600    LYMPHSABS 0.3* 04/11/2013 1155   MONOABS 0.6 04/11/2013 1155   EOSABS 0.0 04/11/2013 1155   BASOSABS 0.0 04/11/2013 1155     Assessment/Plan:  1. AKI/CKD- s/p cad renal tx with baseline creat around 1.8-2.3 at baseline.  Will hold further zaroxolyn and cont with current meds 2. hyeprcarbic respiratory failure- appreciate PCCM assistance. Now has outpt C-Pap arranged and will f/u with [facility] pulmonology 3. ARF/H flu PNA 4. DM- agree with increase in lantus 5. OHS- per PCCM 6. CHF- diastolic. Marked improvement and diuresed 5kg since admission.  7. RBBB- await cards eval prior to d/c 8. Metabolic alkalosis- due to hypercapnic pulm disease 9. Immunosuppressive therapy- tacrolimus level therapeutic. 10. dispo- hopeful discharge to home today after seen by Cardiology.  Has home O2 and outpt C-Pap equipment.  Will follow up with Dr. Jimmy Footman on 04/30/13 as previously scheduled. 11. Will sign off. Call with questions/concerns [place] [facility]

## 2013-04-26 ENCOUNTER — Ambulatory Visit (HOSPITAL_BASED_OUTPATIENT_CLINIC_OR_DEPARTMENT_OTHER): Payer: Medicare Other

## 2013-04-26 IMAGING — US US ABDOMEN LIMITED
1 series · 14 of 25 positions shown · non-contrast
Comparison: None.

CLINICAL DATA: Cellulitis in the midline of the lower back

LIMITED ABDOMINAL ULTRASOUND

[Series 1: us abdomen limited · 0.09mm/px · 14 of 27 slices shown]
[im 1/27]
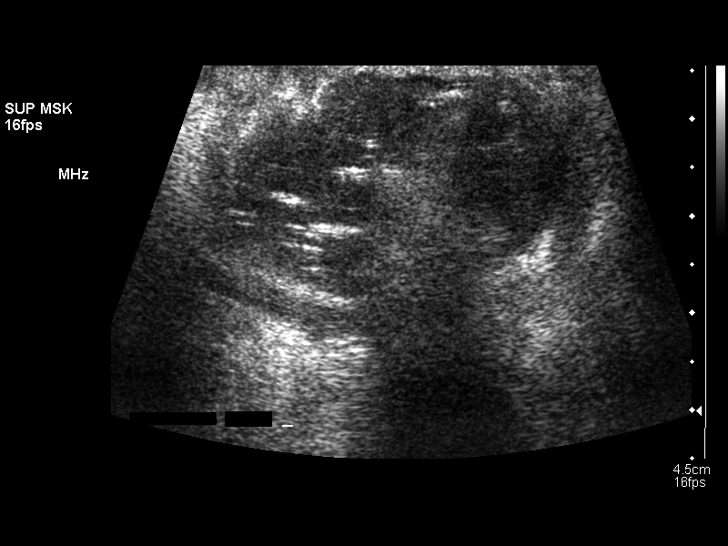
[im 3/27]
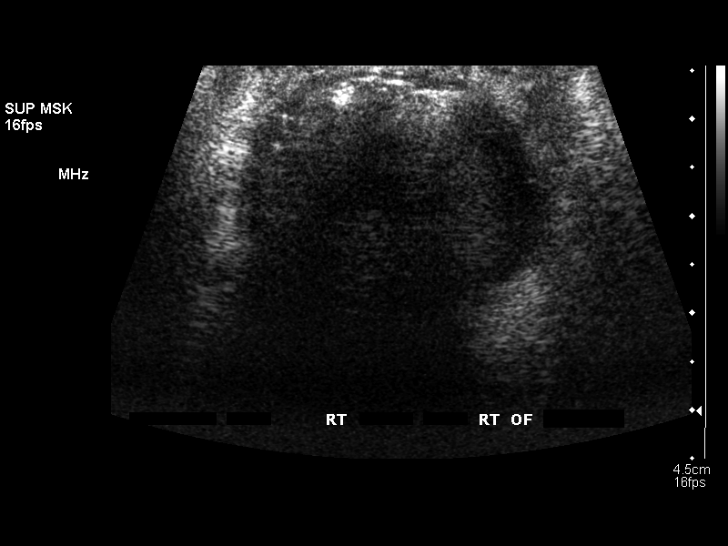
[im 5/27]
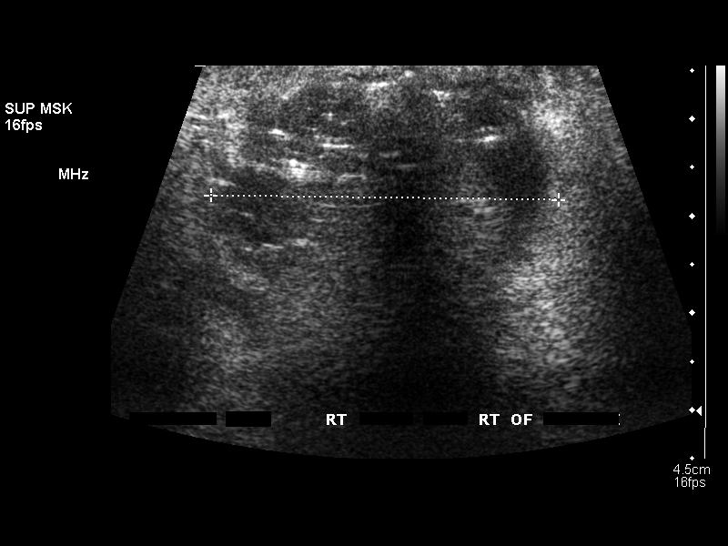
[im 7/27]
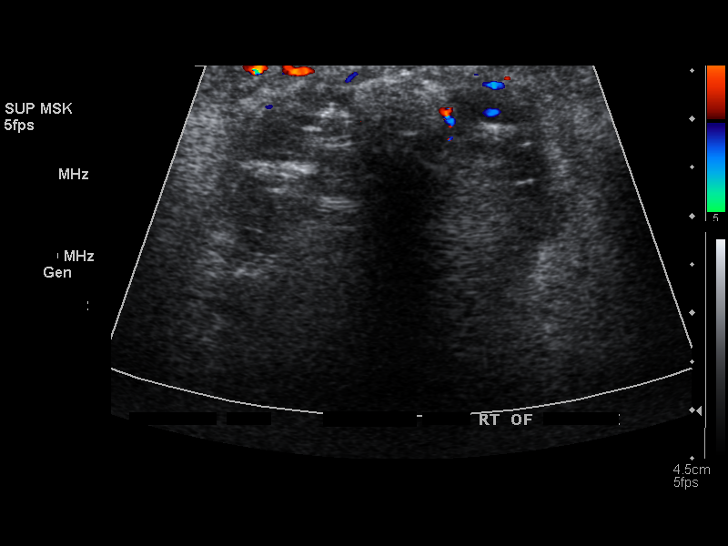
[im 9/27]
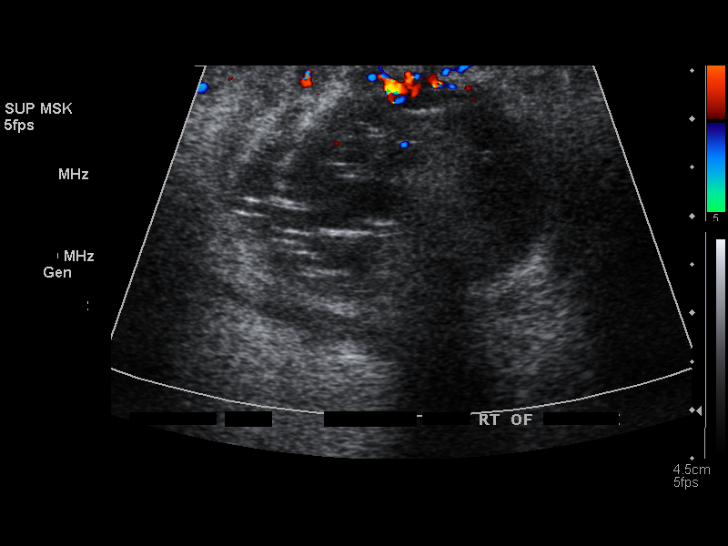
[im 10/27]
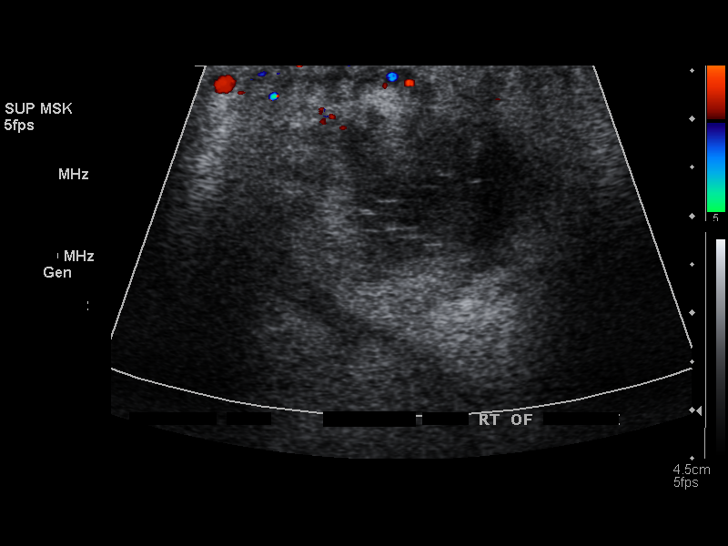
[im 12/27]
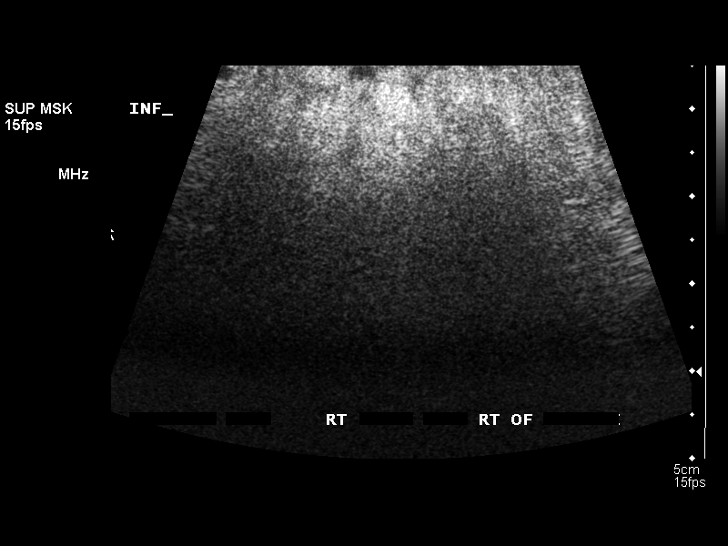
[im 15/27]
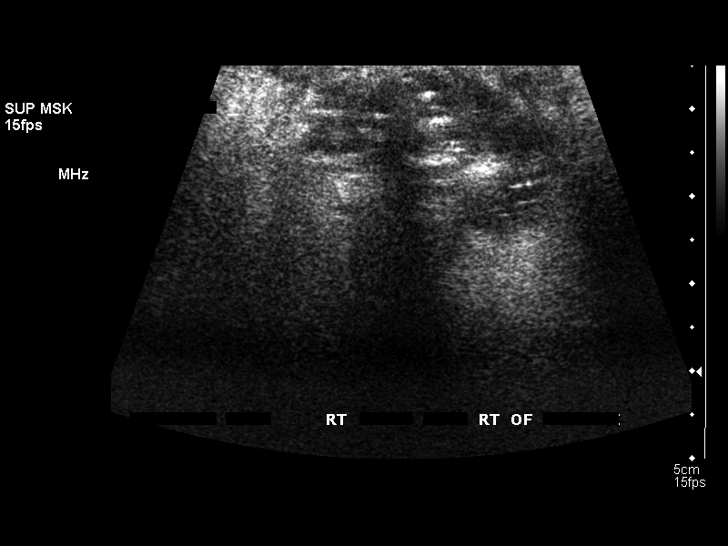
[im 17/27]
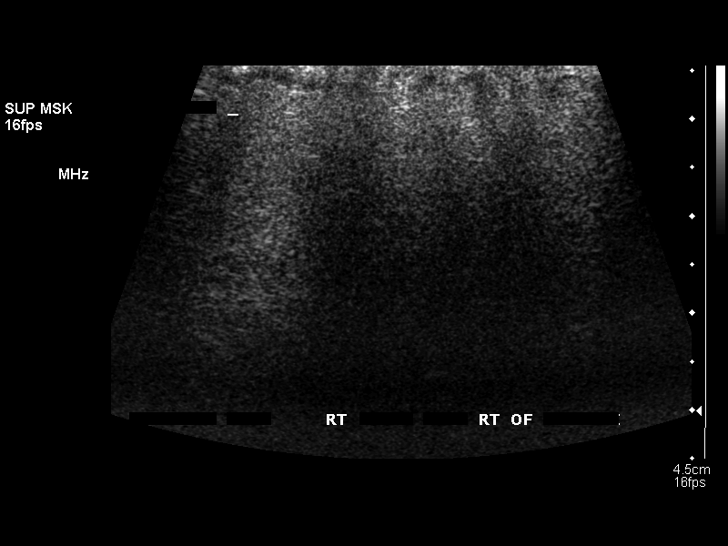
[im 18/27]
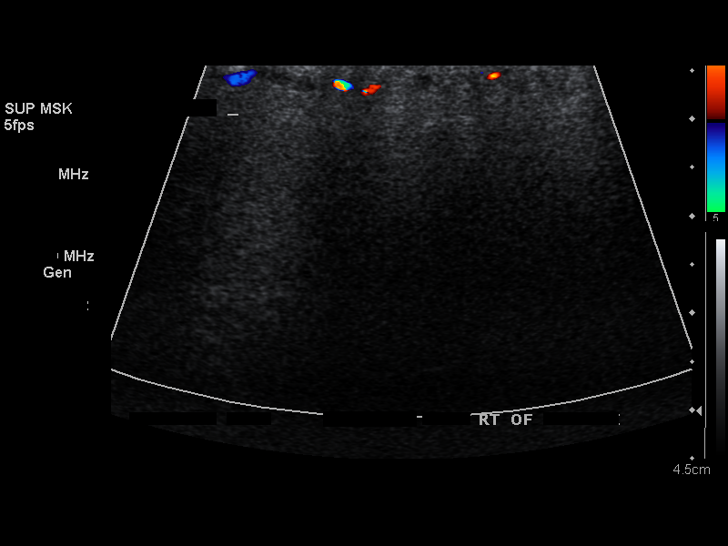
[im 20/27]
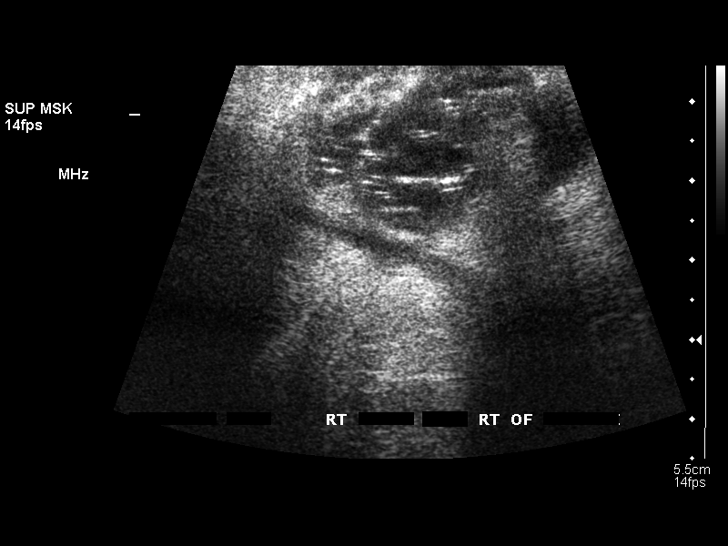
[im 22/27]
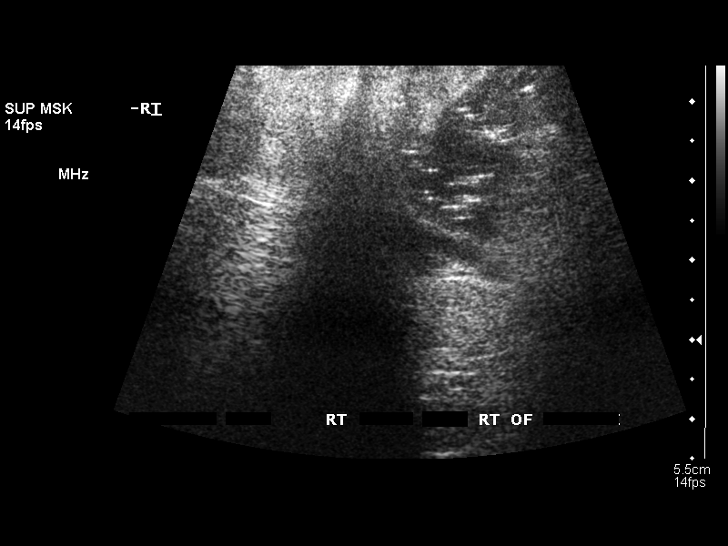
[im 24/27]
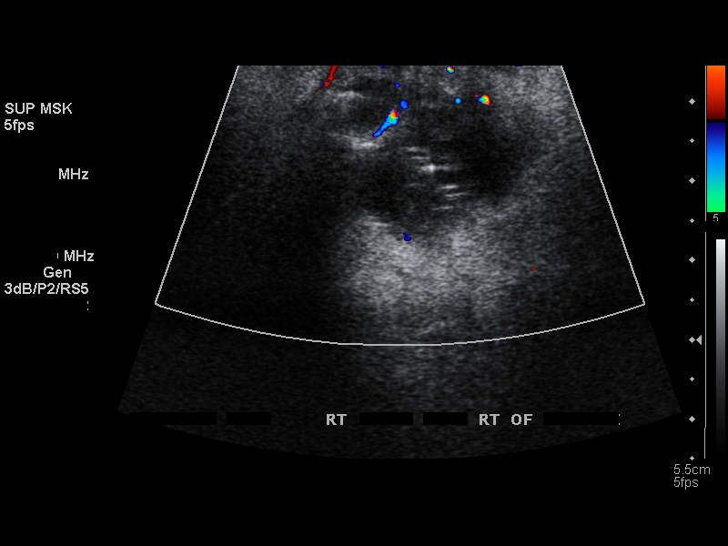
[im 27/27]
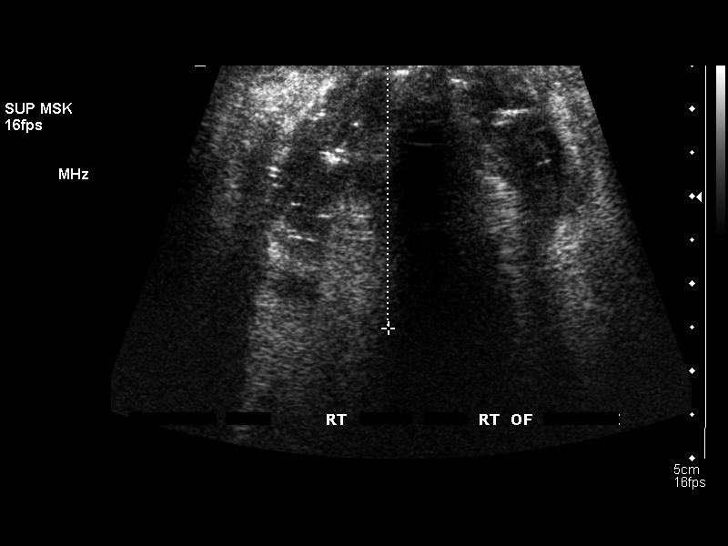

[14 of 25 positions shown; findings below may reference images not displayed]

FINDINGS: Ultrasound over the reddened palpable area was performed.
At that site there is a complex subcutaneous structure of 3.3 x
x 3.6 cm most consistent with abscess, possibly containing some air
bubbles.  This structure is only 3.7 mm below the service of this
scan, extending to a depth of 3.4 cm.
IMPRESSION: Complex subcutaneous collection superficial in location most
consistent with abscess possibly containing some air bubbles as
described above.

## 2013-04-28 DIAGNOSIS — R0989 Other specified symptoms and signs involving the circulatory and respiratory systems: Secondary | ICD-10-CM | POA: Diagnosis not present

## 2013-04-28 DIAGNOSIS — I509 Heart failure, unspecified: Secondary | ICD-10-CM | POA: Diagnosis not present

## 2013-04-28 DIAGNOSIS — G4730 Sleep apnea, unspecified: Secondary | ICD-10-CM | POA: Diagnosis not present

## 2013-04-28 DIAGNOSIS — R0609 Other forms of dyspnea: Secondary | ICD-10-CM | POA: Diagnosis not present

## 2013-04-28 DIAGNOSIS — J189 Pneumonia, unspecified organism: Secondary | ICD-10-CM | POA: Diagnosis not present

## 2013-04-28 DIAGNOSIS — E1129 Type 2 diabetes mellitus with other diabetic kidney complication: Secondary | ICD-10-CM | POA: Diagnosis not present

## 2013-04-28 DIAGNOSIS — N19 Unspecified kidney failure: Secondary | ICD-10-CM | POA: Diagnosis not present

## 2013-04-30 DIAGNOSIS — E119 Type 2 diabetes mellitus without complications: Secondary | ICD-10-CM | POA: Diagnosis not present

## 2013-04-30 DIAGNOSIS — J449 Chronic obstructive pulmonary disease, unspecified: Secondary | ICD-10-CM | POA: Diagnosis not present

## 2013-04-30 DIAGNOSIS — N2581 Secondary hyperparathyroidism of renal origin: Secondary | ICD-10-CM | POA: Diagnosis not present

## 2013-04-30 DIAGNOSIS — Z940 Kidney transplant status: Secondary | ICD-10-CM | POA: Diagnosis not present

## 2013-04-30 DIAGNOSIS — I129 Hypertensive chronic kidney disease with stage 1 through stage 4 chronic kidney disease, or unspecified chronic kidney disease: Secondary | ICD-10-CM | POA: Diagnosis not present

## 2013-04-30 DIAGNOSIS — N184 Chronic kidney disease, stage 4 (severe): Secondary | ICD-10-CM | POA: Diagnosis not present

## 2013-05-10 ENCOUNTER — Ambulatory Visit (HOSPITAL_BASED_OUTPATIENT_CLINIC_OR_DEPARTMENT_OTHER): Payer: Medicare Other

## 2013-05-10 DIAGNOSIS — E8779 Other fluid overload: Secondary | ICD-10-CM | POA: Diagnosis not present

## 2013-05-10 DIAGNOSIS — Z940 Kidney transplant status: Secondary | ICD-10-CM | POA: Diagnosis not present

## 2013-05-10 DIAGNOSIS — I519 Heart disease, unspecified: Secondary | ICD-10-CM | POA: Diagnosis not present

## 2013-05-10 DIAGNOSIS — Z8709 Personal history of other diseases of the respiratory system: Secondary | ICD-10-CM | POA: Diagnosis not present

## 2013-05-12 ENCOUNTER — Encounter (HOSPITAL_BASED_OUTPATIENT_CLINIC_OR_DEPARTMENT_OTHER): Payer: Medicare Other

## 2013-05-12 LAB — FUNGUS CULTURE W SMEAR: Fungal Smear: NONE SEEN

## 2013-05-18 DIAGNOSIS — Z940 Kidney transplant status: Secondary | ICD-10-CM | POA: Diagnosis not present

## 2013-05-25 DIAGNOSIS — E785 Hyperlipidemia, unspecified: Secondary | ICD-10-CM | POA: Diagnosis not present

## 2013-05-25 DIAGNOSIS — M109 Gout, unspecified: Secondary | ICD-10-CM | POA: Diagnosis not present

## 2013-05-25 DIAGNOSIS — E213 Hyperparathyroidism, unspecified: Secondary | ICD-10-CM | POA: Diagnosis not present

## 2013-05-25 DIAGNOSIS — E119 Type 2 diabetes mellitus without complications: Secondary | ICD-10-CM | POA: Diagnosis not present

## 2013-05-25 DIAGNOSIS — Z940 Kidney transplant status: Secondary | ICD-10-CM | POA: Diagnosis not present

## 2013-05-25 DIAGNOSIS — N2581 Secondary hyperparathyroidism of renal origin: Secondary | ICD-10-CM | POA: Diagnosis not present

## 2013-05-25 DIAGNOSIS — E669 Obesity, unspecified: Secondary | ICD-10-CM | POA: Diagnosis not present

## 2013-05-25 LAB — AFB CULTURE WITH SMEAR (NOT AT ARMC)

## 2013-06-21 DIAGNOSIS — Z9489 Other transplanted organ and tissue status: Secondary | ICD-10-CM | POA: Diagnosis not present

## 2013-06-21 DIAGNOSIS — N529 Male erectile dysfunction, unspecified: Secondary | ICD-10-CM | POA: Diagnosis not present

## 2013-06-21 DIAGNOSIS — C61 Malignant neoplasm of prostate: Secondary | ICD-10-CM | POA: Diagnosis not present

## 2013-07-07 DIAGNOSIS — Z79899 Other long term (current) drug therapy: Secondary | ICD-10-CM | POA: Diagnosis not present

## 2013-07-07 DIAGNOSIS — Z940 Kidney transplant status: Secondary | ICD-10-CM | POA: Diagnosis not present

## 2013-07-23 DIAGNOSIS — N2581 Secondary hyperparathyroidism of renal origin: Secondary | ICD-10-CM | POA: Diagnosis not present

## 2013-07-23 DIAGNOSIS — E11311 Type 2 diabetes mellitus with unspecified diabetic retinopathy with macular edema: Secondary | ICD-10-CM | POA: Diagnosis not present

## 2013-07-23 DIAGNOSIS — E11329 Type 2 diabetes mellitus with mild nonproliferative diabetic retinopathy without macular edema: Secondary | ICD-10-CM | POA: Diagnosis not present

## 2013-07-23 DIAGNOSIS — E1039 Type 1 diabetes mellitus with other diabetic ophthalmic complication: Secondary | ICD-10-CM | POA: Diagnosis not present

## 2013-07-23 DIAGNOSIS — I10 Essential (primary) hypertension: Secondary | ICD-10-CM | POA: Diagnosis not present

## 2013-07-23 DIAGNOSIS — E119 Type 2 diabetes mellitus without complications: Secondary | ICD-10-CM | POA: Diagnosis not present

## 2013-07-23 DIAGNOSIS — Z940 Kidney transplant status: Secondary | ICD-10-CM | POA: Diagnosis not present

## 2013-09-08 DIAGNOSIS — Z79899 Other long term (current) drug therapy: Secondary | ICD-10-CM | POA: Diagnosis not present

## 2013-09-08 DIAGNOSIS — Z940 Kidney transplant status: Secondary | ICD-10-CM | POA: Diagnosis not present

## 2013-09-24 DIAGNOSIS — Z23 Encounter for immunization: Secondary | ICD-10-CM | POA: Diagnosis not present

## 2013-09-24 DIAGNOSIS — N2581 Secondary hyperparathyroidism of renal origin: Secondary | ICD-10-CM | POA: Diagnosis not present

## 2013-09-24 DIAGNOSIS — I10 Essential (primary) hypertension: Secondary | ICD-10-CM | POA: Diagnosis not present

## 2013-09-24 DIAGNOSIS — E119 Type 2 diabetes mellitus without complications: Secondary | ICD-10-CM | POA: Diagnosis not present

## 2013-09-24 DIAGNOSIS — Z940 Kidney transplant status: Secondary | ICD-10-CM | POA: Diagnosis not present

## 2013-10-26 DIAGNOSIS — R05 Cough: Secondary | ICD-10-CM | POA: Diagnosis not present

## 2013-10-26 DIAGNOSIS — J309 Allergic rhinitis, unspecified: Secondary | ICD-10-CM | POA: Diagnosis not present

## 2013-10-27 DIAGNOSIS — Z940 Kidney transplant status: Secondary | ICD-10-CM | POA: Diagnosis not present

## 2013-10-27 DIAGNOSIS — Z79899 Other long term (current) drug therapy: Secondary | ICD-10-CM | POA: Diagnosis not present

## 2013-11-07 ENCOUNTER — Encounter: Payer: Self-pay | Admitting: Cardiology

## 2013-11-09 ENCOUNTER — Ambulatory Visit (INDEPENDENT_AMBULATORY_CARE_PROVIDER_SITE_OTHER): Payer: BC Managed Care – PPO | Admitting: Cardiology

## 2013-11-09 ENCOUNTER — Encounter: Payer: Self-pay | Admitting: Cardiology

## 2013-11-09 VITALS — BP 100/60 | HR 103 | Ht 69.0 in | Wt 217.0 lb

## 2013-11-09 DIAGNOSIS — I5032 Chronic diastolic (congestive) heart failure: Secondary | ICD-10-CM

## 2013-11-09 DIAGNOSIS — R7989 Other specified abnormal findings of blood chemistry: Secondary | ICD-10-CM

## 2013-11-09 DIAGNOSIS — E669 Obesity, unspecified: Secondary | ICD-10-CM

## 2013-11-09 DIAGNOSIS — Z940 Kidney transplant status: Secondary | ICD-10-CM

## 2013-11-09 NOTE — Patient Instructions (Signed)
Your physician recommends that you continue on your current medications as directed. Please refer to the Current Medication list given to you today.  Your physician wants you to follow-up in: 1 year with Dr. Skains. You will receive a reminder letter in the mail two months in advance. If you don't receive a letter, please call our office to schedule the follow-up appointment.  

## 2013-11-09 NOTE — Progress Notes (Signed)
[facility] [address] [place], [place]  [address] Phone: [phone] Fax:  [phone]  Date:  11/09/2013   ID:  Russell Bowers, DOB 12/30/1950, MRN [id number]  PCP:  Gennette Pac, MD   History of Present Illness: Russell Bowers is a 62 y.o. male hypercarbic respiratory failure, who had transient elevation in troponin during respiratory failure. comorbidities include renal transplantation. Echocardiogram demonstrated normal ejection fraction of 60% with no wall motion abnormality, mild aortic stenosis which is chronic. Right bundle branch block noted, chronic. He did undergo stress test in September of 2012 which was low risk with no evidence of ischemia, normal EF. Volume overload in hospital. 214 here. Stable. On high dose lasix. 223 on admit. Overall he is doing much better. He is an Optometrist. Semiretired.  After his kidney transplant, maintaining fluid balance very well. He is on high dose Lasix as directed by nephrology.    Wt Readings from Last 3 Encounters:  11/09/13 217 lb (98.431 kg)  04/21/13 217 lb 2.5 oz (98.5 kg)  04/01/13 220 lb 12.8 oz (100.154 kg)     Past Medical History  Diagnosis Date  . Diabetes mellitus without complication   . Cancer     Prostate cancer  . Renal failure   . Detached retina   . Hypertension   . Cellulitis and abscess of unspecified site 2014    lower back  . ADHD (attention deficit hyperactivity disorder)   . Asthma   . Cellulitis and abscess of trunk 2014    history of back abscess  . Elevated troponin   . Shortness of breath     Past Surgical History  Procedure Laterality Date  . Kidney transplant  12/01/2011  . Prostectomy  11/2010  . Retinal detachment surgery  06/2002    Current Outpatient Prescriptions  Medication Sig Dispense Refill  . allopurinol (ZYLOPRIM) 300 MG tablet 300 mg.      . aspirin EC 81 MG tablet Take 81 mg by mouth every other day.      . B-D ULTRAFINE III SHORT PEN 31G X 8 MM  MISC       . cetirizine (ZYRTEC) 10 MG tablet Take 10 mg by mouth daily.      . furosemide (LASIX) 80 MG tablet Take 2 tablets (160 mg total) by mouth 3 (three) times daily.  180 tablet  0  . glipiZIDE (GLUCOTROL XL) 10 MG 24 hr tablet 10 mg.      . insulin glargine (LANTUS) 100 UNIT/ML injection Inject 0.15 mLs (15 Units total) into the skin at bedtime.  10 mL    . insulin regular (NOVOLIN R,HUMULIN R) 100 units/mL injection Inject 2-8 Units into the skin 3 (three) times daily as needed. Takes as needed per sliding scale      . metoprolol tartrate (LOPRESSOR) 25 MG tablet Take 25 mg by mouth 2 (two) times daily.      . mycophenolate (MYFORTIC) 180 MG EC tablet Take 360 mg by mouth 2 (two) times daily.      [name] omeprazole (PRILOSEC) 20 MG capsule Take 20 mg by mouth daily.       . pravastatin (PRAVACHOL) 80 MG tablet Take 80 mg by mouth daily.      . sodium chloride (OCEAN) 0.65 % nasal spray Place 3-4 sprays into the nose at bedtime.      . tacrolimus (PROGRAF) 1 MG capsule Take 3 mg by mouth 2 (two) times daily.      [name]  calcitRIOL (ROCALTROL) 0.25 MCG capsule 0.25 mg alternating with 0.5 (every other day)      . cinacalcet (SENSIPAR) 30 MG tablet 30 mg.      . polyethylene glycol powder (GLYCOLAX/MIRALAX) powder Take 17 g by mouth daily as needed.       No current facility-administered medications for this visit.    Allergies:   No Known Allergies  Social History:  The patient  reports that he quit smoking about 20 years ago. He does not have any smokeless tobacco history on file. He reports that he drinks alcohol.   ROS:  Please see the history of present illness.   No recent edema, no orthopnea, no chest pain  PHYSICAL EXAM: VS:  BP 100/60  Pulse 103  Ht 5\' 9"  (1.753 m)  Wt 217 lb (98.431 kg)  BMI 32.03 kg/m2 Well nourished, well developed, in no acute distress HEENT: normal Neck: no JVD Cardiac:  normal S1, S2; RRR; no murmur Lungs:  clear to auscultation bilaterally, no  wheezing, rhonchi or rales Abd: soft, nontender, no hepatomegaly Ext: no edema Skin: warm and dry Neuro: no focal abnormalities noted      ASSESSMENT AND PLAN:  1. Previous episode of diastolic heart failure in the setting of hypercarbic respiratory failure and pneumonia-currently doing well, no symptoms. Continue to maintain fluid balance with furosemide as directed by nephrology. No major changes. Prior stress test reassuring. 2. Diabetes-per primary team 3. Status post kidney transplant-nephrology. Overall doing well. 4. Right bundle branch block-chronic 5. Obesity-encourage weight loss  Signed, Candee Furbish, MD [facility]  11/09/2013 11:19 AM

## 2013-11-15 DIAGNOSIS — Z940 Kidney transplant status: Secondary | ICD-10-CM | POA: Diagnosis not present

## 2013-11-24 DIAGNOSIS — Z940 Kidney transplant status: Secondary | ICD-10-CM | POA: Diagnosis not present

## 2013-11-30 DIAGNOSIS — H338 Other retinal detachments: Secondary | ICD-10-CM | POA: Diagnosis not present

## 2013-11-30 DIAGNOSIS — H33059 Total retinal detachment, unspecified eye: Secondary | ICD-10-CM | POA: Diagnosis not present

## 2013-11-30 DIAGNOSIS — H2510 Age-related nuclear cataract, unspecified eye: Secondary | ICD-10-CM | POA: Diagnosis not present

## 2013-11-30 DIAGNOSIS — E119 Type 2 diabetes mellitus without complications: Secondary | ICD-10-CM | POA: Diagnosis not present

## 2013-11-30 DIAGNOSIS — H35379 Puckering of macula, unspecified eye: Secondary | ICD-10-CM | POA: Diagnosis not present

## 2013-11-30 DIAGNOSIS — E1139 Type 2 diabetes mellitus with other diabetic ophthalmic complication: Secondary | ICD-10-CM | POA: Diagnosis not present

## 2013-11-30 DIAGNOSIS — E11329 Type 2 diabetes mellitus with mild nonproliferative diabetic retinopathy without macular edema: Secondary | ICD-10-CM | POA: Diagnosis not present

## 2013-12-10 DIAGNOSIS — I10 Essential (primary) hypertension: Secondary | ICD-10-CM | POA: Diagnosis not present

## 2013-12-10 DIAGNOSIS — E785 Hyperlipidemia, unspecified: Secondary | ICD-10-CM | POA: Diagnosis not present

## 2013-12-10 DIAGNOSIS — Z940 Kidney transplant status: Secondary | ICD-10-CM | POA: Diagnosis not present

## 2013-12-10 DIAGNOSIS — R0609 Other forms of dyspnea: Secondary | ICD-10-CM | POA: Diagnosis not present

## 2013-12-10 DIAGNOSIS — Z48298 Encounter for aftercare following other organ transplant: Secondary | ICD-10-CM | POA: Diagnosis not present

## 2013-12-10 DIAGNOSIS — E119 Type 2 diabetes mellitus without complications: Secondary | ICD-10-CM | POA: Diagnosis not present

## 2013-12-10 DIAGNOSIS — E872 Acidosis: Secondary | ICD-10-CM | POA: Diagnosis not present

## 2013-12-10 DIAGNOSIS — Z9489 Other transplanted organ and tissue status: Secondary | ICD-10-CM | POA: Diagnosis not present

## 2013-12-10 DIAGNOSIS — I959 Hypotension, unspecified: Secondary | ICD-10-CM | POA: Diagnosis not present

## 2013-12-14 DIAGNOSIS — E7800 Pure hypercholesterolemia, unspecified: Secondary | ICD-10-CM | POA: Diagnosis not present

## 2013-12-14 DIAGNOSIS — Z23 Encounter for immunization: Secondary | ICD-10-CM | POA: Diagnosis not present

## 2013-12-20 DIAGNOSIS — N529 Male erectile dysfunction, unspecified: Secondary | ICD-10-CM | POA: Diagnosis not present

## 2013-12-20 DIAGNOSIS — C61 Malignant neoplasm of prostate: Secondary | ICD-10-CM | POA: Diagnosis not present

## 2013-12-20 DIAGNOSIS — Z9489 Other transplanted organ and tissue status: Secondary | ICD-10-CM | POA: Diagnosis not present

## 2013-12-28 ENCOUNTER — Telehealth: Payer: Self-pay | Admitting: Internal Medicine

## 2013-12-28 DIAGNOSIS — I129 Hypertensive chronic kidney disease with stage 1 through stage 4 chronic kidney disease, or unspecified chronic kidney disease: Secondary | ICD-10-CM | POA: Diagnosis not present

## 2013-12-28 DIAGNOSIS — E118 Type 2 diabetes mellitus with unspecified complications: Secondary | ICD-10-CM | POA: Diagnosis not present

## 2013-12-28 DIAGNOSIS — N183 Chronic kidney disease, stage 3 unspecified: Secondary | ICD-10-CM | POA: Diagnosis not present

## 2013-12-28 DIAGNOSIS — Z940 Kidney transplant status: Secondary | ICD-10-CM | POA: Diagnosis not present

## 2013-12-28 DIAGNOSIS — N2581 Secondary hyperparathyroidism of renal origin: Secondary | ICD-10-CM | POA: Diagnosis not present

## 2013-12-28 DIAGNOSIS — E785 Hyperlipidemia, unspecified: Secondary | ICD-10-CM | POA: Diagnosis not present

## 2013-12-28 DIAGNOSIS — M109 Gout, unspecified: Secondary | ICD-10-CM | POA: Diagnosis not present

## 2013-12-28 NOTE — Telephone Encounter (Signed)
Spoke with pt. He was calling to make sure we could see were he was in the hospital back in April. I advised him we could. We do have those records. Nothing further needed

## 2013-12-31 ENCOUNTER — Institutional Professional Consult (permissible substitution): Payer: Medicare Other | Admitting: Internal Medicine

## 2013-12-31 DIAGNOSIS — Z940 Kidney transplant status: Secondary | ICD-10-CM | POA: Diagnosis not present

## 2014-01-21 ENCOUNTER — Ambulatory Visit (INDEPENDENT_AMBULATORY_CARE_PROVIDER_SITE_OTHER)
Admission: RE | Admit: 2014-01-21 | Discharge: 2014-01-21 | Disposition: A | Discharge status: Home / Self Care | Payer: Medicare Other | Source: Ambulatory Visit | Attending: Internal Medicine | Admitting: Internal Medicine

## 2014-01-21 ENCOUNTER — Encounter: Payer: Self-pay | Admitting: Internal Medicine

## 2014-01-21 ENCOUNTER — Ambulatory Visit (INDEPENDENT_AMBULATORY_CARE_PROVIDER_SITE_OTHER): Payer: Medicare Other | Admitting: Internal Medicine

## 2014-01-21 VITALS — BP 122/60 | HR 93 | Temp 98.1°F | Ht 67.0 in | Wt 221.0 lb

## 2014-01-21 DIAGNOSIS — R0989 Other specified symptoms and signs involving the circulatory and respiratory systems: Secondary | ICD-10-CM

## 2014-01-21 DIAGNOSIS — R0609 Other forms of dyspnea: Secondary | ICD-10-CM

## 2014-01-21 DIAGNOSIS — R05 Cough: Secondary | ICD-10-CM | POA: Diagnosis not present

## 2014-01-21 DIAGNOSIS — J9610 Chronic respiratory failure, unspecified whether with hypoxia or hypercapnia: Secondary | ICD-10-CM | POA: Diagnosis not present

## 2014-01-21 DIAGNOSIS — R059 Cough, unspecified: Secondary | ICD-10-CM | POA: Diagnosis not present

## 2014-01-21 DIAGNOSIS — R0600 Dyspnea, unspecified: Secondary | ICD-10-CM | POA: Insufficient documentation

## 2014-01-21 NOTE — Progress Notes (Signed)
Subjective:    Patient ID: Russell Bowers, male    DOB: June 10, 1951,MRN: [id number]  HPI  Admit date: 04/11/2013  Discharge date: 04/21/2013  Discharge Diagnoses:  Principal Problem:  Acute respiratory failure with hypercapnia  Active Problems:  Pulmonary infiltrates  RBBB  History of renal transplantation  Acute on chronic renal failure  Immunocompromised  DM2 (diabetes mellitus, type 2)  Encephalopathy acute  Acute diastolic CHF (congestive heart failure)  Hypokalemia  Demand ischemia of myocardium  Protein-calorie malnutrition, severe  Pneumonia due to Hemophilus influenzae (H. influenzae)  Obesity, unspecified  Brief Summary: quit smoking 1994  with a PMH of renal transplant 11/2011 admitted 4/20 with acute hypercarbic resp failure in setting H Flu HCAP (similar recent admit February at [facility]) and likely decompensated OSA. Required intubation initially. Course c/b acute on chronic renal insufficiency, poor glycemic control and back abscess with hx peptostreptococcus.  Consults:  Cardiology - [facility]  Renal - Deterding  Infectious disease - Tommy Medal  SIGNIFICANT EVENTS / STUDIES:  4/20 admitted with hypercarbic respiratory failure, pulmonary infiltrates, acute on chronic renal insuff, RBBB  4/21 2D echo>>>EF60-65%, mild aortic stenosis, mild MVR, mildly dilated LA, NO diagnostic regional wall motion abnormality  4/22 extubated  LINES / TUBES:  ETT 4/20 >> 4/22  R IJ CVL 4/20 >>4/23  CULTURES:  MRSA PCR 4/20 >> NEG  Strep Ag 4/20 >> NEG  Legionella Ag 4/20 >>Negative  Streptococcal Urine Ag 4/20 >> negative  Urine 4/20 >> neg  Resp virus panel 4/20 >> negative  PJP DFA 4/20 >> negative  BAL cx 4/20 >> H. Influenzae, beta lactam neg  Fungal respiratory cx 4/21 >>  AFB resp. cx 4/21 >>  AFB smear - negative  Blood 4/20 >> neg  ANTIBIOTICS:  ABX per ID  Vanc 4/20 >> 4/23  Ceftaz 4/20 >> 4/26  Levoflox 4/20 >> 4/21  Flagyl 4/21>>4/23  augmentin (per ID) 4/24>>4/26     Hospital Summary by Discharge Diagnosis  Acute hypercarbic respiratory failure - multifactorial in setting H Flu HCAP (recent hospital admission) likely c/b decompensated OSA. Required intubation initially and was treated with broad spectrum abx per ID. Extubated after 48 hours. He is still requiring continuous O2 with a RA sat of 82%. Likely chronic hypercarbia. See below.  OSA - previous sleep study done confirming this at [facility] but pt never received CPAP machine. Will d/c with home auto CPAP. F/u with PCP  Elevated BNP  Transient troponin elevation  Tachycardia/ RBBB  2D echo as above. Seen in consultation by cardiology. Felt likely demand ischemic, no heparin given in setting no wall motion abnormality. Diuresed gently and metoprolol adjusted. Diuresed approx 5kg since admit. Myoview unremarkable. Will f/u with cards as outpt for further w/u.  Hx renal transplant 2012  Acute on chronic renal insufficiency -- baseline SCr 1.8-2.3  Followed closely by renal who adjusted diuretics. Zaroxolyn used but now off. Will d/c on lasix alone. Cont prograf and f/u with Dr. Jimmy Footman as outpt as below.  DM - blood sugars were difficult to control. Lantus increased and SSI utilized. Will need further f/u with PCP as outpt.  HFlu HCAP  Back abscess with hx peptostreptococcus  Abx as above. Back abscess not acute issue. Seen by gen surgery who felt no need for intervention this admit. ID consulted for abx recs given immunosuppression. Abx now complete. He will f/u with gen surgery as outpt for back abscess.     01/21/2014 consult ov/Haila Dena re: chronic resp failure  /  K Little/ Dr Detterding/ [facility]  Chief Complaint  Patient presents with  . Pulmonary Consult    Referred per Dr Hulan Fess. Pt states started on o2 in April 2014 and wants to make sure he still qualifies for o2. He c/o occ prod cough with minimal clear sputum.   uses 02 at hs onloy  Coughing daily x white mucus x intermittent during day  not worse in am , not while sleeping, worse fall and spring. Can play golf s 02  but not aerobically active - not change tolerance to walk at golf  x one years  No obvious patterns in day to day or daytime variabilty  or cp or chest tightness, subjective heeze overt sinus or hb symptoms. No unusual exp hx or h/o childhood pna/ asthma or knowledge of premature birth.  Sleeping ok without nocturnal  or early am exacerbation  of respiratory  c/o's or need for noct saba. Also denies any obvious fluctuation of symptoms with weather or environmental changes or other aggravating or alleviating factors except as outlined above   Current Medications, Allergies, Complete Past Medical History, Past Surgical History, Family History, and Social History were reviewed in [employer] record.        Review of Systems  Constitutional: Negative for fever, chills, activity change, appetite change and unexpected weight change.  HENT: Positive for congestion, postnasal drip and rhinorrhea. Negative for dental problem, sneezing, sore throat, trouble swallowing and voice change.   Eyes: Negative for visual disturbance.  Respiratory: Positive for cough. Negative for choking and shortness of breath.   Cardiovascular: Negative for chest pain and leg swelling.  Gastrointestinal: Negative for nausea, vomiting and abdominal pain.  Genitourinary: Negative for difficulty urinating.  Musculoskeletal: Negative for arthralgias.  Skin: Negative for rash.  Psychiatric/Behavioral: Negative for behavioral problems and confusion.       Objective:   Physical Exam  amb wm nad  Wt Readings from Last 3 Encounters:  01/21/14 221 lb (100.245 kg)  11/09/13 217 lb (98.431 kg)  04/21/13 217 lb 2.5 oz (98.5 kg)    HEENT: nl dentition, turbinates, and orophanx. Nl external ear canals without cough reflex   NECK :  without JVD/Nodes/TM/ nl carotid upstrokes bilaterally   LUNGS: no acc muscle use,   Decreased bs both bases without cough on insp or exp maneuvers   CV:  RRR  no s3 or murmur or increase in P2, no edema   ABD:  soft and nontender with nl excursion in the supine position. No bruits or organomegaly, bowel sounds nl  MS:  warm without deformities, calf tenderness, cyanosis or clubbing  SKIN: warm and dry without lesions    NEURO:  alert, approp, no deficits         CXR  01/21/2014 :   Poor inspiratory effort. No acute abnormality seen.       Assessment & Plan:

## 2014-01-21 NOTE — Patient Instructions (Addendum)
Please see patient coordinator before you leave today  to schedule overnight 02 on room air   Please remember to go to the  x-ray department downstairs for your tests - we will call you with the results when they are available.  Please schedule a follow up office visit in 6 weeks, call sooner if needed with pfts  - in meantime goal is to keep your oxygen level at or above 90%

## 2014-01-22 ENCOUNTER — Encounter: Payer: Self-pay | Admitting: Internal Medicine

## 2014-01-22 NOTE — Assessment & Plan Note (Addendum)
-   02 rx since admit 03/2013 - 01/21/2014   Walked RA x one lap @ 185 stopped due to sat 88% - ono RA ordered  cxr's suggestive of bilateral diaphragm dysfunction very low lung volumes dating back at least a year and last hco3 level 39 in 03/2013 strongly suggestive of hypercarbia (albeit well compensated) as well.  Will see pt back for pfts and sniff test then go from there.

## 2014-01-24 NOTE — Progress Notes (Signed)
Quick Note:  Spoke with pt and notified of results per Dr. Wert. Pt verbalized understanding and denied any questions.  ______ 

## 2014-01-26 DIAGNOSIS — G4733 Obstructive sleep apnea (adult) (pediatric): Secondary | ICD-10-CM | POA: Diagnosis not present

## 2014-02-04 ENCOUNTER — Encounter: Payer: Self-pay | Admitting: Internal Medicine

## 2014-03-03 ENCOUNTER — Other Ambulatory Visit: Payer: Self-pay | Admitting: Internal Medicine

## 2014-03-03 DIAGNOSIS — J9610 Chronic respiratory failure, unspecified whether with hypoxia or hypercapnia: Secondary | ICD-10-CM

## 2014-03-04 ENCOUNTER — Ambulatory Visit (INDEPENDENT_AMBULATORY_CARE_PROVIDER_SITE_OTHER): Payer: Medicare Other | Admitting: Internal Medicine

## 2014-03-04 ENCOUNTER — Encounter: Payer: Self-pay | Admitting: Internal Medicine

## 2014-03-04 VITALS — BP 132/70 | HR 110 | Ht 67.0 in | Wt 222.0 lb

## 2014-03-04 DIAGNOSIS — J9610 Chronic respiratory failure, unspecified whether with hypoxia or hypercapnia: Secondary | ICD-10-CM

## 2014-03-04 DIAGNOSIS — R0989 Other specified symptoms and signs involving the circulatory and respiratory systems: Secondary | ICD-10-CM

## 2014-03-04 DIAGNOSIS — R0609 Other forms of dyspnea: Secondary | ICD-10-CM

## 2014-03-04 DIAGNOSIS — R0600 Dyspnea, unspecified: Secondary | ICD-10-CM

## 2014-03-04 NOTE — Addendum Note (Signed)
Addended by: Maurice March on: 03/04/2014 12:34 PM   Modules accepted: Orders

## 2014-03-04 NOTE — Progress Notes (Signed)
Subjective:    Patient ID: Russell Bowers, male    DOB: [birth year],MRN: [id number]  HPI  Admit date: 04/11/2013  Discharge date: 04/21/2013  Discharge Diagnoses:   Acute respiratory failure with hypercapnia   Pulmonary infiltrates  RBBB  History of renal transplantation  Acute on chronic renal failure  Immunocompromised  DM2 (diabetes mellitus, type 2)  Encephalopathy acute  Acute diastolic CHF (congestive heart failure)  Hypokalemia  Demand ischemia of myocardium  Protein-calorie malnutrition, severe  Pneumonia due to Hemophilus influenzae (H. influenzae)  Obesity, unspecified  Brief Summary: quit smoking 1994  with a PMH of renal transplant 11/2011 admitted 4/20 with acute hypercarbic resp failure in setting H Flu HCAP (similar recent admit February at [facility]) and likely decompensated OSA. Required intubation initially. Course c/b acute on chronic renal insufficiency, poor glycemic control and back abscess with hx peptostreptococcus.  Consults:  Cardiology - [facility]  Renal - [facility]eterding  Infectious disease - Tommy Medal  SIGNIFICANT EVENTS / STUDIES:  4/20 admitted with hypercarbic respiratory failure, pulmonary infiltrates, acute on chronic renal insuff, RBBB  4/21 2D echo>>>EF60-65%, mild aortic stenosis, mild MVR, mildly dilated LA, NO diagnostic regional wall motion abnormality     01/21/2014 consult ov/Wert re: chronic resp failure  / K Little/ Dr Detterding/ [facility]  Chief Complaint  Patient presents with  . Pulmonary Consult    Referred per Dr Hulan Fess. Pt states started on o2 in April 2014 and wants to make sure he still qualifies for o2. He c/o occ prod cough with minimal clear sputum.   uses 02 at hs only  Coughing daily x white mucus x intermittent during day not worse in am , not while sleeping, worse fall and spring. Can play golf s 02  but not aerobically active - not change tolerance to walk at golf  x one years    03/04/2014 f/u ov/Wert re: chronic resp failure x  2 y p ? ali  Chief Complaint  Patient presents with  . Follow-up    review pft.  Pt c/o increased sob, fatigue, prod cough with clear muocus X2 mos.    transplant was done at [facility] Dec 2012 bad pna 2013 and from that point forward didn't feel his breathing recovered, "afraid to do much" even though he has amb 02 and oximeter showing he's not desaturating on 02   No obvious day to day or daytime variabilty or assoc chronic cough or cp or chest tightness, subjective wheeze overt sinus or hb symptoms. No unusual exp hx or h/o childhood pna/ asthma or knowledge of premature birth.  Sleeping ok without nocturnal  or early am exacerbation  of respiratory  c/o's or need for noct saba. Also denies any obvious fluctuation of symptoms with weather or environmental changes or other aggravating or alleviating factors except as outlined above   Current Medications, Allergies, Complete Past Medical History, Past Surgical History, Family History, and Social History were reviewed in [employer] record.  ROS  The following are not active complaints unless bolded sore throat, dysphagia, dental problems, itching, sneezing,  nasal congestion or excess/ purulent secretions, ear ache,   fever, chills, sweats, unintended wt loss, pleuritic or exertional cp, hemoptysis,  orthopnea pnd or leg swelling, presyncope, palpitations, heartburn, abdominal pain, anorexia, nausea, vomiting, diarrhea  or change in bowel or urinary habits, change in stools or urine, dysuria,hematuria,  rash, arthralgias, visual complaints, headache, numbness weakness or ataxia or problems with walking or coordination,  change in mood/affect  or memory.                      Objective:   Physical Exam  amb wm nad  03/04/2014        222 Wt Readings from Last 3 Encounters:  01/21/14 221 lb (100.245 kg)  11/09/13 217 lb (98.431 kg)  04/21/13 217 lb 2.5 oz (98.5 kg)    HEENT: nl dentition, turbinates, and orophanx. Nl  external ear canals without cough reflex   NECK :  without JVD/Nodes/TM/ nl carotid upstrokes bilaterally   LUNGS: no acc muscle use,  Decreased bs both bases without cough on insp or exp maneuvers   CV:  RRR  no s3 or murmur or increase in P2, no edema   ABD:  soft and nontender with nl excursion in the supine position. No bruits or organomegaly, bowel sounds nl  MS:  warm without deformities, calf tenderness, cyanosis or clubbing  SKIN: warm and dry without lesions    NEURO:  alert, approp, no deficits         CXR  01/21/2014 :  Poor inspiratory effort. No acute abnormality seen.       Assessment & Plan:

## 2014-03-04 NOTE — Patient Instructions (Addendum)
Please see patient coordinator before you leave today  to schedule sniff test to make sure your diaphragms are working welll  Please see patient coordinator before you leave today  to schedule 0vernight 02 on 2lpm   Adjust your 02 so you maintain it above and work up to 30 min twice daily walking to improve conditioning and wt losss  Use incentive spirometer as much as you can   Follow up with one of our sleep doctors in about 6 weeks (new consult)

## 2014-03-04 NOTE — Progress Notes (Signed)
PFT done today. 

## 2014-03-05 NOTE — Assessment & Plan Note (Addendum)
-   01/21/2014  Walked RA x 1 laps @ 185 ft each stopped due to desat to 88%  - PFT's 03/04/2014  VC 1.49 (35%) nl ratio but slt concave fv contour no better p B2  and DLCO 52 corrects to 117  - Sniff test 03/05/2014 >>>  Main problem is restrictive ? If both diaphragms working as does not appear to have enough ILD to explain restrictive changes and obesity makes this much worse that it would otherwise be.   For now rec sniff test and walk more on 02 to place himself in neg cal balance  See instructions for specific recommendations which were reviewed directly with the patient who was given a copy with highlighter outlining the key components.

## 2014-03-05 NOTE — Assessment & Plan Note (Addendum)
-   02 rx since admit 03/2013 with hco3 39 at that point  - 01/21/2014   [facility] RA x one lap @ 185 stopped due to sat 88% - ono RA   01/26/14 desat < 89 x 8h, 24m> rec 02/04/2014 continue 02   - rec repeat ono 2lpm - proceed with sleep consult  ? Needs bipap vs cpap (did not tol mask earlier so poor candidate for bipap)

## 2014-03-06 LAB — PULMONARY FUNCTION TEST
DL/VA % PRED: 117 %
DL/VA: 5.2 ml/min/mmHg/L
DLCO unc % pred: 52 %
DLCO unc: 14.83 ml/min/mmHg
FEF 25-75 PRE: 0.81 L/s
FEF 25-75 Post: 1.25 L/sec
FEF2575-%CHANGE-POST: 54 %
FEF2575-%PRED-POST: 48 %
FEF2575-%PRED-PRE: 31 %
FEV1-%CHANGE-POST: 7 %
FEV1-%PRED-PRE: 32 %
FEV1-%Pred-Post: 35 %
FEV1-Post: 1.12 L
FEV1-Pre: 1.04 L
FEV1FVC-%Change-Post: 4 %
FEV1FVC-%Pred-Pre: 102 %
FEV6-%CHANGE-POST: 2 %
FEV6-%PRED-POST: 34 %
FEV6-%Pred-Pre: 33 %
FEV6-Post: 1.37 L
FEV6-Pre: 1.33 L
FEV6FVC-%Change-Post: 0 %
FEV6FVC-%Pred-Post: 104 %
FEV6FVC-%Pred-Pre: 104 %
FVC-%Change-Post: 2 %
FVC-%PRED-POST: 33 %
FVC-%Pred-Pre: 32 %
FVC-POST: 1.39 L
FVC-Pre: 1.35 L
POST FEV1/FVC RATIO: 80 %
Post FEV6/FVC ratio: 100 %
Pre FEV1/FVC ratio: 77 %
Pre FEV6/FVC Ratio: 99 %
RV % pred: 71 %
RV: 1.52 L
TLC % pred: 46 %
TLC: 3 L

## 2014-03-07 ENCOUNTER — Ambulatory Visit (HOSPITAL_COMMUNITY): Payer: Medicare Other

## 2014-03-08 ENCOUNTER — Telehealth: Payer: Self-pay | Admitting: Pulmonary Disease

## 2014-03-08 NOTE — Telephone Encounter (Signed)
Called spoke with pt. He is wanting to move up sniff test date. I have called and appt has been r/s for tomorrow. Pt aware.

## 2014-03-09 ENCOUNTER — Ambulatory Visit (HOSPITAL_COMMUNITY)
Admission: RE | Admit: 2014-03-09 | Discharge: 2014-03-09 | Disposition: A | Discharge status: Home / Self Care | Payer: Medicare Other | Source: Ambulatory Visit | Attending: Internal Medicine | Admitting: Internal Medicine

## 2014-03-09 ENCOUNTER — Encounter: Payer: Self-pay | Admitting: Internal Medicine

## 2014-03-09 DIAGNOSIS — R0989 Other specified symptoms and signs involving the circulatory and respiratory systems: Principal | ICD-10-CM | POA: Insufficient documentation

## 2014-03-09 DIAGNOSIS — J9610 Chronic respiratory failure, unspecified whether with hypoxia or hypercapnia: Secondary | ICD-10-CM

## 2014-03-09 DIAGNOSIS — R0609 Other forms of dyspnea: Secondary | ICD-10-CM | POA: Insufficient documentation

## 2014-03-10 NOTE — Progress Notes (Signed)
Quick Note:  Spoke with pt and notified of results per Dr. Wert. Pt verbalized understanding and denied any questions.  ______ 

## 2014-03-11 ENCOUNTER — Ambulatory Visit (INDEPENDENT_AMBULATORY_CARE_PROVIDER_SITE_OTHER): Payer: Medicare Other | Admitting: Internal Medicine

## 2014-03-11 ENCOUNTER — Encounter: Payer: Self-pay | Admitting: Internal Medicine

## 2014-03-11 ENCOUNTER — Ambulatory Visit (HOSPITAL_COMMUNITY): Payer: Medicare Other

## 2014-03-11 VITALS — BP 118/70 | HR 117 | Temp 98.1°F | Ht 68.0 in | Wt 223.0 lb

## 2014-03-11 DIAGNOSIS — J9610 Chronic respiratory failure, unspecified whether with hypoxia or hypercapnia: Secondary | ICD-10-CM | POA: Diagnosis not present

## 2014-03-11 DIAGNOSIS — R0600 Dyspnea, unspecified: Secondary | ICD-10-CM

## 2014-03-11 DIAGNOSIS — R0989 Other specified symptoms and signs involving the circulatory and respiratory systems: Secondary | ICD-10-CM

## 2014-03-11 DIAGNOSIS — R0609 Other forms of dyspnea: Secondary | ICD-10-CM | POA: Diagnosis not present

## 2014-03-11 MED ORDER — AZELASTINE-FLUTICASONE 137-50 MCG/ACT NA SUSP: 1.0000 | Freq: Two times a day (BID) | NASAL | Status: DC

## 2014-03-11 NOTE — Patient Instructions (Addendum)
Adjust 02 at night for saturations in low 90s only - no higher than 92   Adjust your 02 so you maintain it above 90 and work up to 30 min twice daily walking to improve conditioning and wt loss  Use incentive spirometer as much as you can   Afrin 12 hour x twice daily before dymista twice daily x 5 days, then stop afrin and continue dymista until return to office   We will call your dme company for humdified 02  Late add: move up f/u with sleep doc

## 2014-03-11 NOTE — Progress Notes (Signed)
Subjective:    Patient ID: Russell Bowers, male    DOB: [birth year],MRN: [id number]   Brief patient profile:   Admit date: 04/11/2013  Discharge date: 04/21/2013  Discharge Diagnoses:  Acute respiratory failure with hypercapnia  Pulmonary infiltrates  RBBB  History of renal transplantation  Acute on chronic renal failure  Immunocompromised  DM2 (diabetes mellitus, type 2)  Encephalopathy acute  Acute diastolic CHF (congestive heart failure)  Hypokalemia  Demand ischemia of myocardium  Protein-calorie malnutrition, severe  Pneumonia due to Hemophilus influenzae (H. influenzae)  Obesity, unspecified  Brief Summary: quit smoking 1994  with a PMH of renal transplant 11/2011 admitted 4/20 with acute hypercarbic resp failure in setting H Flu HCAP (similar recent admit February at [facility]) and likely decompensated OSA. Required intubation initially. Course c/b acute on chronic renal insufficiency, poor glycemic control and back abscess with hx peptostreptococcus.  Consults:  Cardiology - Russell Bowers  Renal - Russell Bowers  Infectious disease - Russell Bowers  SIGNIFICANT EVENTS / STUDIES:  4/20 admitted with hypercarbic respiratory failure, pulmonary infiltrates, acute on chronic renal insuff, RBBB  4/21 2D echo>>>EF60-65%, mild aortic stenosis, mild MVR, mildly dilated LA, NO diagnostic regional wall motion abnormality     01/21/2014 consult ov/Russell Bowers re: chronic resp failure  / K Little/ Dr Detterding/ Russell Bowers  Chief Complaint  Patient presents with  . Pulmonary Consult    Referred per Dr Russell Bowers. Pt states started on o2 in April 2014 and wants to make sure he still qualifies for o2. He c/o occ prod cough with minimal clear sputum.   uses 02 at hs only  Coughing daily x white mucus x intermittent during day not worse in am , not while sleeping, worse fall and spring. Can play golf s 02  but not aerobically active - not change tolerance to walk at golf  x one years rec Please see patient coordinator  before you leave today  to schedule overnight 02 on room air  Please remember to go to the  x-ray department downstairs for your tests - we will call you with the results when they are available. Please schedule a follow up office visit in 6 weeks, call sooner if needed with pfts  - in meantime goal is to keep your oxygen level at or above 90%     03/04/2014 f/u ov/Russell Bowers re: chronic resp failure x 2 y p ? ali  Chief Complaint  Patient presents with  . Follow-up    review pft.  Pt c/o increased sob, fatigue, prod cough with clear muocus X2 mos.    transplant was done at [facility] Dec 2012 bad pna 2013 and from that point forward didn't feel his breathing recovered, "afraid to do much" even though he has amb 02 and oximeter showing he's not desaturating on 02  rec   schedule sniff test to make sure your diaphragms are working well > very little movement but no paradox Please see patient coordinator before you leave today  to schedule 0vernight 02 on 2lpm > pending Adjust your 02 so you maintain it above and work up to 30 min twice daily walking to improve conditioning and wt loss > did not do Use incentive spirometer as much as you can > did not do    03/11/2014 f/u ov/Russell Bowers re: chronic respiratory failure with hypercarbia  Chief Complaint  Patient presents with  . Follow-up    Increased sob and tightness in chest over past few days  Daytime coughing variable white mucus  does not wake him up  Not humidified 02 Not using IS/ not exercising  Nose clogged from 02 Has tried cpap and couldn't tol mask  No obvious day to day or daytime variabilty or assoc chronic cough or cp or chest tightness, subjective wheeze overt sinus or hb symptoms. No unusual exp hx or h/o childhood pna/ asthma or knowledge of premature birth.  Sleeping ok without nocturnal  or early am exacerbation  of respiratory  c/o's or need for noct saba. Also denies any obvious fluctuation of symptoms with weather or environmental  changes or other aggravating or alleviating factors except as outlined above   Current Medications, Allergies, Complete Past Medical History, Past Surgical History, Family History, and Social History were reviewed in [employer] record.  ROS  The following are not active complaints unless bolded sore throat, dysphagia, dental problems, itching, sneezing,  nasal congestion or excess/ purulent secretions, ear ache,   fever, chills, sweats, unintended wt loss, pleuritic or exertional cp, hemoptysis,  orthopnea pnd or leg swelling, presyncope, palpitations, heartburn, abdominal pain, anorexia, nausea, vomiting, diarrhea  or change in bowel or urinary habits, change in stools or urine, dysuria,hematuria,  rash, arthralgias, visual complaints, headache, numbness weakness or ataxia or problems with walking or coordination,  change in mood/affect or memory.                      Objective:   Physical Exam  amb wm nad  03/04/2014        222 > 223  03/11/14  Wt Readings from Last 3 Encounters:  01/21/14 221 lb (100.245 kg)  11/09/13 217 lb (98.431 kg)  04/21/13 217 lb 2.5 oz (98.5 kg)    HEENT: nl dentition, turbinates, and orophanx. Nl external ear canals without cough reflex   NECK :  without JVD/Nodes/TM/ nl carotid upstrokes bilaterally   LUNGS: no acc muscle use,  Decreased bs both bases without cough on insp or exp maneuvers   CV:  RRR  no s3 or murmur or increase in P2, no edema   ABD:  soft and nontender with nl excursion in the supine position. No bruits or organomegaly, bowel sounds nl  MS:  warm without deformities, calf tenderness, cyanosis or clubbing  SKIN: warm and dry without lesions    NEURO:  alert, approp, no deficits         CXR  01/21/2014 :  Poor inspiratory effort. No acute abnormality seen.       Assessment & Plan:

## 2014-03-12 ENCOUNTER — Encounter: Payer: Self-pay | Admitting: Internal Medicine

## 2014-03-12 NOTE — Assessment & Plan Note (Signed)
-   02 rx since admit 03/2013 with hco3 39 at that point  - 01/21/2014   [facility] RA x one lap @ 185 stopped due to sat 88% - ono RA   01/26/14 desat < 89 x 8h, 57m> rec 02/04/2014 continue 02 - rec repeat ono 2lpm 03/04/2014 > completed but report not received  - 03/11/2014   Walked 2.5  x one lap @ 185 stopped due to  desat 88 to corrected on 3lpm and walked two more laps nl pace and sat 87% at end  Very limited options here since not able to tol cpap > first step is to check his ono on 02 and see if may qualify for bipap trial as he does also have mild hypercarbia with last hc03 33 12/2013 renal labs  In meantime needs to follow prev recs to ex bid x 30 min at slower pace than he walked today to last the 30 min needed to get into neg cal bal Use IS I will see if can move up appt for sleep Work on nasal congestion/ add humidity to 02

## 2014-03-12 NOTE — Assessment & Plan Note (Signed)
-   01/21/2014  Walked RA x 1 laps @ 185 ft each stopped due to desat to 88%  - PFT's 03/04/2014  VC 1.49 (35%) nl ratio but slt concave fv contour no better p B2  and DLCO 52 corrects to 117  - Sniff test neg 03/09/2014   No evidence of significant airway problem x for rhinitis which may be contributing to noct > daytime sense of sob  rec trial of dymista  See instructions for specific recommendations which were reviewed directly with the patient who was given a copy with highlighter outlining the key components.

## 2014-03-14 ENCOUNTER — Telehealth: Payer: Self-pay | Admitting: *Deleted

## 2014-03-14 DIAGNOSIS — J9610 Chronic respiratory failure, unspecified whether with hypoxia or hypercapnia: Secondary | ICD-10-CM

## 2014-03-14 NOTE — Telephone Encounter (Signed)
Spoke with the pt and confirmed appt date and time  He states that [facility] still has not evaluated him for portable o2  I have sent order to [facility]

## 2014-03-14 NOTE — Telephone Encounter (Signed)
Message copied by Rosana Berger on Mon Mar 14, 2014 11:30 AM ------      Message from: Tanda Rockers      Created: Sat Mar 12, 2014  7:15 AM       See if we can move up his f/u with one of the 4 sleep docs to asap - I would be happy to see one of their coughers or copd consults if a swap will work  ------

## 2014-03-14 NOTE — Telephone Encounter (Signed)
RA had opening on 03/16/14 at 11:15 so I went ahead and booked this spot  [facility] for the pt

## 2014-03-14 NOTE — Telephone Encounter (Signed)
Pt called back to let us know he received Leslie's message about new appt. He wants to know if change was sent to [facility] for his oxygen.

## 2014-03-15 ENCOUNTER — Encounter: Payer: Self-pay | Admitting: Internal Medicine

## 2014-03-16 ENCOUNTER — Ambulatory Visit (INDEPENDENT_AMBULATORY_CARE_PROVIDER_SITE_OTHER): Payer: Medicare Other | Admitting: Pulmonary Disease

## 2014-03-16 ENCOUNTER — Telehealth: Payer: Self-pay | Admitting: Internal Medicine

## 2014-03-16 ENCOUNTER — Encounter: Payer: Self-pay | Admitting: Pulmonary Disease

## 2014-03-16 VITALS — BP 106/58 | HR 106 | Temp 98.2°F | Wt 223.6 lb

## 2014-03-16 DIAGNOSIS — R0609 Other forms of dyspnea: Secondary | ICD-10-CM

## 2014-03-16 DIAGNOSIS — G4733 Obstructive sleep apnea (adult) (pediatric): Secondary | ICD-10-CM | POA: Insufficient documentation

## 2014-03-16 DIAGNOSIS — R0989 Other specified symptoms and signs involving the circulatory and respiratory systems: Secondary | ICD-10-CM

## 2014-03-16 DIAGNOSIS — R0600 Dyspnea, unspecified: Secondary | ICD-10-CM

## 2014-03-16 NOTE — Telephone Encounter (Signed)
ONO on 2LPM reviewed Per MW- keep using o2 2lpm with sleep  Spoke with pt and notified of results per Dr. Melvyn Novas. Pt verbalized understanding and denied any questions.

## 2014-03-16 NOTE — Assessment & Plan Note (Signed)
This seems to be related to hypoventilation, due to diaphragmatic issues rather than obstructive airway disease. Other possibility is neuromuscular weakness.

## 2014-03-16 NOTE — Assessment & Plan Note (Addendum)
He does seem to have significant nocturnal hypoventilation and will likely benefit from BiPAP. CPAP/ Bipap titration study at our sleep center Stay on oxygen meantime I have advised him against Benadryl for fear of worsening hypercarbia, he can use melatonin 5-10 mg for sleep onset insomnia.

## 2014-03-16 NOTE — Patient Instructions (Signed)
CPAP/ Bipap titration study at our sleep center Stay on oxygen meantime

## 2014-03-16 NOTE — Progress Notes (Signed)
Subjective:    Patient ID: Russell Bowers, male    DOB: Dec 22, 1951, 63 y.o.   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 He quit smoking 1994 (20-pack-year)with a PMH of renal transplant 11/2011 admitted 03/2013 with acute hypercarbic resp failure in setting H Flu HCAP (similar recent admit February at [facility]) and likely decompensated OSA. Required mechanical ventilation Course c/b acute on chronic renal insufficiency, poor glycemic control and back abscess with hx peptostreptococcus.  PFTs showed severe restriction with a ratio 77, FVC 1.35-32%, FEV1 1.04-32% and TLC of 3.0 L-46% with DLCO at 52% that corrects for alveolar volumes suggestive of extra parenchymal restriction. Stress test showed minimal mobility of diaphragms. Polysomnogram at [facility] in 02/2012 showed moderate obstructive sleep apnea with AHI of 25 events per hour, lowest desaturation of 71%. Unfortunately he did not tolerate CPAP on a subsequent titration study. Although he was tried on auto CPAP he did not tolerate the full face mask but now states that he is willing to try this again. Nocturnal oximetry showed severe desaturation on 2 L of oxygen with a pattern suggestive hypoventilation, he spent 393 minutes with sats less than 88%.  Epworth sleepiness score is 11/24 Bedtime is between 11:50 PM, sleep latency varies from 1-2 hours, he uses Benadryl on occasion,he sleeps on his side with 2 pillows, reports one to 2 awakenings without any post void sleep latency and is out of bed by 9 AM feeling tired. His wife often catches him napping in his recliner. He denies a.m. headaches  Past Medical History  Diagnosis Date  . Diabetes mellitus without complication   . Cancer     Prostate cancer  . Renal failure   . Detached retina   . Hypertension   . Cellulitis and abscess of unspecified site 2014    lower back  . ADHD (attention deficit hyperactivity  disorder)   . Asthma   . Cellulitis and abscess of trunk 2014    history of back abscess  . Elevated troponin   . Shortness of breath   . H/O kidney transplant     Past Surgical History  Procedure Laterality Date  . Kidney transplant  12/01/2011  . Prostectomy  11/2010  . Retinal detachment surgery  06/2002    No Known Allergies  History   Social History  . Marital Status: Married    Spouse Name: N/A    Number of Children: N/A  . Years of Education: N/A   Occupational History  . Retired    Social History Main Topics  . Smoking status: Former Smoker -- 1.00 packs/day for 21 years    Types: Cigarettes    Quit date: 03/16/1993  . Smokeless tobacco: Never Used  . Alcohol Use: 0.0 oz/week    1-2 Glasses of wine per week  . Drug Use: No  . Sexual Activity: Not on file   Other Topics Concern  . Not on file   Social History Narrative  . No narrative on file    Family History  Problem Relation Age of Onset  . Diabetes Mother   . Heart disease Maternal Grandfather   . Heart disease Maternal Grandmother      Review of Systems  Constitutional: Negative for fever and unexpected weight change.  HENT: Positive for congestion and sneezing. Negative for dental problem, ear pain, nosebleeds, postnasal drip, rhinorrhea, sinus pressure, sore throat and trouble swallowing.   Eyes: Negative for redness and itching.  Respiratory: Positive for cough and shortness of breath. Negative for chest tightness and wheezing.   Cardiovascular: Positive for chest pain. Negative for palpitations and leg swelling.  Gastrointestinal: Negative for nausea and vomiting.  Genitourinary: Negative for dysuria.  Musculoskeletal: Negative for joint swelling.  Skin: Negative for rash.  Neurological: Negative for headaches.  Hematological: Does not bruise/bleed easily.  Psychiatric/Behavioral: Negative for dysphoric mood. The patient is nervous/anxious.        Objective:   Physical Exam  Gen.  Pleasant, well-nourished, in no distress, Anxious affect on oxygen by nasal cannula ENT - no lesions, no post nasal drip Neck: No JVD, no thyromegaly, no carotid bruits Lungs: no use of accessory muscles, no dullness to percussion, clear without rales or rhonchi  Cardiovascular: Rhythm regular, heart sounds  normal, no murmurs or gallops, no peripheral edema Abdomen: soft and non-tender, no hepatosplenomegaly, BS normal. Musculoskeletal: No deformities, no cyanosis or clubbing Neuro:  alert, non focal        Assessment & Plan:

## 2014-03-20 ENCOUNTER — Emergency Department (HOSPITAL_COMMUNITY): Payer: Medicare Other

## 2014-03-20 ENCOUNTER — Encounter (HOSPITAL_COMMUNITY): Payer: Self-pay | Admitting: Emergency Medicine

## 2014-03-20 ENCOUNTER — Inpatient Hospital Stay (HOSPITAL_COMMUNITY)
Admission: EM | Admit: 2014-03-20 | Discharge: 2014-03-23 | DRG: 189 | Disposition: A | Discharge status: Home / Self Care | Payer: Medicare Other | Attending: Internal Medicine | Admitting: Internal Medicine

## 2014-03-20 DIAGNOSIS — J45909 Unspecified asthma, uncomplicated: Secondary | ICD-10-CM | POA: Diagnosis present

## 2014-03-20 DIAGNOSIS — R0989 Other specified symptoms and signs involving the circulatory and respiratory systems: Secondary | ICD-10-CM | POA: Diagnosis not present

## 2014-03-20 DIAGNOSIS — D638 Anemia in other chronic diseases classified elsewhere: Secondary | ICD-10-CM | POA: Diagnosis present

## 2014-03-20 DIAGNOSIS — K219 Gastro-esophageal reflux disease without esophagitis: Secondary | ICD-10-CM | POA: Diagnosis present

## 2014-03-20 DIAGNOSIS — Z9981 Dependence on supplemental oxygen: Secondary | ICD-10-CM | POA: Diagnosis not present

## 2014-03-20 DIAGNOSIS — R6889 Other general symptoms and signs: Secondary | ICD-10-CM | POA: Diagnosis not present

## 2014-03-20 DIAGNOSIS — J9610 Chronic respiratory failure, unspecified whether with hypoxia or hypercapnia: Secondary | ICD-10-CM | POA: Diagnosis not present

## 2014-03-20 DIAGNOSIS — N189 Chronic kidney disease, unspecified: Secondary | ICD-10-CM | POA: Diagnosis present

## 2014-03-20 DIAGNOSIS — R918 Other nonspecific abnormal finding of lung field: Secondary | ICD-10-CM | POA: Diagnosis not present

## 2014-03-20 DIAGNOSIS — I712 Thoracic aortic aneurysm, without rupture, unspecified: Secondary | ICD-10-CM | POA: Diagnosis not present

## 2014-03-20 DIAGNOSIS — Z940 Kidney transplant status: Secondary | ICD-10-CM

## 2014-03-20 DIAGNOSIS — I129 Hypertensive chronic kidney disease with stage 1 through stage 4 chronic kidney disease, or unspecified chronic kidney disease: Secondary | ICD-10-CM | POA: Diagnosis present

## 2014-03-20 DIAGNOSIS — R0600 Dyspnea, unspecified: Secondary | ICD-10-CM

## 2014-03-20 DIAGNOSIS — Z79899 Other long term (current) drug therapy: Secondary | ICD-10-CM | POA: Diagnosis not present

## 2014-03-20 DIAGNOSIS — G4733 Obstructive sleep apnea (adult) (pediatric): Secondary | ICD-10-CM | POA: Diagnosis present

## 2014-03-20 DIAGNOSIS — J9620 Acute and chronic respiratory failure, unspecified whether with hypoxia or hypercapnia: Principal | ICD-10-CM | POA: Diagnosis present

## 2014-03-20 DIAGNOSIS — Z8546 Personal history of malignant neoplasm of prostate: Secondary | ICD-10-CM

## 2014-03-20 DIAGNOSIS — Z87891 Personal history of nicotine dependence: Secondary | ICD-10-CM

## 2014-03-20 DIAGNOSIS — N289 Disorder of kidney and ureter, unspecified: Secondary | ICD-10-CM | POA: Diagnosis present

## 2014-03-20 DIAGNOSIS — I5032 Chronic diastolic (congestive) heart failure: Secondary | ICD-10-CM

## 2014-03-20 DIAGNOSIS — E669 Obesity, unspecified: Secondary | ICD-10-CM | POA: Diagnosis present

## 2014-03-20 DIAGNOSIS — J811 Chronic pulmonary edema: Secondary | ICD-10-CM | POA: Diagnosis not present

## 2014-03-20 DIAGNOSIS — I509 Heart failure, unspecified: Secondary | ICD-10-CM | POA: Diagnosis present

## 2014-03-20 DIAGNOSIS — E119 Type 2 diabetes mellitus without complications: Secondary | ICD-10-CM | POA: Diagnosis not present

## 2014-03-20 DIAGNOSIS — Z7982 Long term (current) use of aspirin: Secondary | ICD-10-CM

## 2014-03-20 DIAGNOSIS — Z794 Long term (current) use of insulin: Secondary | ICD-10-CM | POA: Diagnosis not present

## 2014-03-20 DIAGNOSIS — J986 Disorders of diaphragm: Secondary | ICD-10-CM | POA: Diagnosis present

## 2014-03-20 DIAGNOSIS — R0609 Other forms of dyspnea: Secondary | ICD-10-CM | POA: Diagnosis not present

## 2014-03-20 DIAGNOSIS — J984 Other disorders of lung: Secondary | ICD-10-CM | POA: Diagnosis present

## 2014-03-20 DIAGNOSIS — J9622 Acute and chronic respiratory failure with hypercapnia: Secondary | ICD-10-CM

## 2014-03-20 DIAGNOSIS — I10 Essential (primary) hypertension: Secondary | ICD-10-CM | POA: Diagnosis not present

## 2014-03-20 LAB — CBC WITH DIFFERENTIAL/PLATELET
Basophils Absolute: 0 10*3/uL (ref 0.0–0.1)
Basophils Relative: 0 % (ref 0–1)
EOS ABS: 0.1 10*3/uL (ref 0.0–0.7)
Eosinophils Relative: 1 % (ref 0–5)
HEMATOCRIT: 45.3 % (ref 39.0–52.0)
Hemoglobin: 12.9 g/dL — ABNORMAL LOW (ref 13.0–17.0)
Lymphocytes Relative: 8 % — ABNORMAL LOW (ref 12–46)
Lymphs Abs: 0.8 10*3/uL (ref 0.7–4.0)
MCH: 28.7 pg (ref 26.0–34.0)
MCHC: 28.5 g/dL — AB (ref 30.0–36.0)
MCV: 100.9 fL — ABNORMAL HIGH (ref 78.0–100.0)
MONO ABS: 0.1 10*3/uL (ref 0.1–1.0)
Monocytes Relative: 1 % — ABNORMAL LOW (ref 3–12)
NEUTROS ABS: 9.4 10*3/uL — AB (ref 1.7–7.7)
Neutrophils Relative %: 90 % — ABNORMAL HIGH (ref 43–77)
Platelets: 143 10*3/uL — ABNORMAL LOW (ref 150–400)
RBC: 4.49 MIL/uL (ref 4.22–5.81)
RDW: 14.4 % (ref 11.5–15.5)
WBC: 10.4 10*3/uL (ref 4.0–10.5)

## 2014-03-20 LAB — I-STAT ARTERIAL BLOOD GAS, ED
Acid-Base Excess: 15 mmol/L — ABNORMAL HIGH (ref 0.0–2.0)
BICARBONATE: 46.4 meq/L — AB (ref 20.0–24.0)
O2 Saturation: 89 %
PCO2 ART: 95.5 mmHg — AB (ref 35.0–45.0)
PH ART: 7.295 — AB (ref 7.350–7.450)
PO2 ART: 67 mmHg — AB (ref 80.0–100.0)
TCO2: 49 mmol/L (ref 0–100)

## 2014-03-20 LAB — COMPREHENSIVE METABOLIC PANEL
ALK PHOS: 116 U/L (ref 39–117)
ALT: 8 U/L (ref 0–53)
AST: 10 U/L (ref 0–37)
Albumin: 3.7 g/dL (ref 3.5–5.2)
BILIRUBIN TOTAL: 0.4 mg/dL (ref 0.3–1.2)
BUN: 37 mg/dL — ABNORMAL HIGH (ref 6–23)
CHLORIDE: 94 meq/L — AB (ref 96–112)
CO2: 43 meq/L — AB (ref 19–32)
Calcium: 9.7 mg/dL (ref 8.4–10.5)
Creatinine, Ser: 1.52 mg/dL — ABNORMAL HIGH (ref 0.50–1.35)
GFR calc non Af Amer: 47 mL/min — ABNORMAL LOW (ref >90)
GFR, EST AFRICAN AMERICAN: 55 mL/min — AB (ref >90)
GLUCOSE: 218 mg/dL — AB (ref 70–99)
POTASSIUM: 4.5 meq/L (ref 3.7–5.3)
SODIUM: 144 meq/L (ref 137–147)
Total Protein: 6.7 g/dL (ref 6.0–8.3)

## 2014-03-20 LAB — CBC
HCT: 43.3 % (ref 39.0–52.0)
Hemoglobin: 12.2 g/dL — ABNORMAL LOW (ref 13.0–17.0)
MCH: 28.4 pg (ref 26.0–34.0)
MCHC: 28.2 g/dL — ABNORMAL LOW (ref 30.0–36.0)
MCV: 100.7 fL — ABNORMAL HIGH (ref 78.0–100.0)
Platelets: 137 10*3/uL — ABNORMAL LOW (ref 150–400)
RBC: 4.3 MIL/uL (ref 4.22–5.81)
RDW: 14.5 % (ref 11.5–15.5)
WBC: 8.9 10*3/uL (ref 4.0–10.5)

## 2014-03-20 LAB — CREATININE, SERUM
CREATININE: 1.61 mg/dL — AB (ref 0.50–1.35)
GFR, EST AFRICAN AMERICAN: 51 mL/min — AB (ref >90)
GFR, EST NON AFRICAN AMERICAN: 44 mL/min — AB (ref >90)

## 2014-03-20 LAB — PRO B NATRIURETIC PEPTIDE: PRO B NATRI PEPTIDE: 1788 pg/mL — AB (ref 0–125)

## 2014-03-20 LAB — CBG MONITORING, ED
Glucose-Capillary: 180 mg/dL — ABNORMAL HIGH (ref 70–99)
Glucose-Capillary: 147 mg/dL — ABNORMAL HIGH (ref 70–99)

## 2014-03-20 LAB — AMMONIA: AMMONIA: 45 umol/L (ref 11–60)

## 2014-03-20 MED ORDER — WHITE PETROLATUM GEL
Status: AC
  Administered 2014-03-20: 23:00:00
  Filled 2014-03-20: qty 5

## 2014-03-20 MED ORDER — TACROLIMUS 1 MG PO CAPS
3.0000 mg | ORAL_CAPSULE | Freq: Every day | ORAL | Status: DC
  Administered 2014-03-21 – 2014-03-23 (×3): 3 mg via ORAL
  Filled 2014-03-20 (×3): qty 3

## 2014-03-20 MED ORDER — MYCOPHENOLATE SODIUM 180 MG PO TBEC
360.0000 mg | DELAYED_RELEASE_TABLET | Freq: Two times a day (BID) | ORAL | Status: DC
Start: 2014-03-20 — End: 2014-03-23
  Administered 2014-03-20 – 2014-03-23 (×6): 360 mg via ORAL
  Filled 2014-03-20 (×7): qty 2

## 2014-03-20 MED ORDER — LORATADINE 10 MG PO TABS
10.0000 mg | ORAL_TABLET | Freq: Every day | ORAL | Status: DC
  Administered 2014-03-21 – 2014-03-23 (×3): 10 mg via ORAL
  Filled 2014-03-20 (×3): qty 1

## 2014-03-20 MED ORDER — ASPIRIN EC 81 MG PO TBEC
81.0000 mg | DELAYED_RELEASE_TABLET | Freq: Every day | ORAL | Status: DC
  Administered 2014-03-21 – 2014-03-23 (×3): 81 mg via ORAL
  Filled 2014-03-20 (×3): qty 1

## 2014-03-20 MED ORDER — TACROLIMUS 1 MG PO CAPS
2.0000 mg | ORAL_CAPSULE | Freq: Every day | ORAL | Status: DC
  Administered 2014-03-20 – 2014-03-22 (×3): 2 mg via ORAL
  Filled 2014-03-20 (×4): qty 2

## 2014-03-20 MED ORDER — FUROSEMIDE 80 MG PO TABS
160.0000 mg | ORAL_TABLET | Freq: Three times a day (TID) | ORAL | Status: DC
  Administered 2014-03-20 – 2014-03-23 (×9): 160 mg via ORAL
  Filled 2014-03-20 (×11): qty 2
  Filled 2014-03-20: qty 8

## 2014-03-20 MED ORDER — SIMVASTATIN 40 MG PO TABS
40.0000 mg | ORAL_TABLET | Freq: Every day | ORAL | Status: DC
  Administered 2014-03-21 – 2014-03-22 (×2): 40 mg via ORAL
  Filled 2014-03-20 (×3): qty 1

## 2014-03-20 MED ORDER — INSULIN ASPART 100 UNIT/ML ~~LOC~~ SOLN
2.0000 [IU] | SUBCUTANEOUS | Status: DC
Start: 2014-03-20 — End: 2014-03-20
  Administered 2014-03-20: 4 [IU] via SUBCUTANEOUS
  Filled 2014-03-20: qty 1

## 2014-03-20 MED ORDER — METOPROLOL TARTRATE 25 MG PO TABS
25.0000 mg | ORAL_TABLET | Freq: Two times a day (BID) | ORAL | Status: DC
  Administered 2014-03-20 – 2014-03-23 (×6): 25 mg via ORAL
  Filled 2014-03-20 (×7): qty 1

## 2014-03-20 MED ORDER — INSULIN ASPART 100 UNIT/ML ~~LOC~~ SOLN
0.0000 [IU] | SUBCUTANEOUS | Status: DC
  Administered 2014-03-20: 2 [IU] via SUBCUTANEOUS
  Administered 2014-03-21: 8 [IU] via SUBCUTANEOUS
  Administered 2014-03-21 (×2): 3 [IU] via SUBCUTANEOUS
  Administered 2014-03-22: 5 [IU] via SUBCUTANEOUS
  Administered 2014-03-22: 3 [IU] via SUBCUTANEOUS

## 2014-03-20 MED ORDER — ALLOPURINOL 300 MG PO TABS
300.0000 mg | ORAL_TABLET | Freq: Every day | ORAL | Status: DC
  Administered 2014-03-21 – 2014-03-23 (×3): 300 mg via ORAL
  Filled 2014-03-20 (×3): qty 1

## 2014-03-20 MED ORDER — HEPARIN SODIUM (PORCINE) 5000 UNIT/ML IJ SOLN
5000.0000 [IU] | Freq: Three times a day (TID) | INTRAMUSCULAR | Status: DC
  Administered 2014-03-20 – 2014-03-23 (×8): 5000 [IU] via SUBCUTANEOUS
  Filled 2014-03-20 (×12): qty 1

## 2014-03-20 MED ORDER — SODIUM CHLORIDE 0.9 % IV SOLN: 250.0000 mL | INTRAVENOUS | Status: DC | PRN

## 2014-03-20 MED ORDER — PANTOPRAZOLE SODIUM 40 MG PO TBEC
40.0000 mg | DELAYED_RELEASE_TABLET | Freq: Every day | ORAL | Status: DC
  Administered 2014-03-20 – 2014-03-23 (×4): 40 mg via ORAL
  Filled 2014-03-20 (×4): qty 1

## 2014-03-20 MED ORDER — ALBUTEROL SULFATE (2.5 MG/3ML) 0.083% IN NEBU: 2.5000 mg | INHALATION_SOLUTION | RESPIRATORY_TRACT | Status: DC | PRN

## 2014-03-20 MED ORDER — CINACALCET HCL 30 MG PO TABS
30.0000 mg | ORAL_TABLET | Freq: Every day | ORAL | Status: DC
  Administered 2014-03-21 – 2014-03-22 (×2): 30 mg via ORAL
  Filled 2014-03-20 (×4): qty 1

## 2014-03-20 MED ORDER — CALCITRIOL 0.25 MCG PO CAPS
0.2500 ug | ORAL_CAPSULE | Freq: Every day | ORAL | Status: DC
  Administered 2014-03-21 – 2014-03-23 (×3): 0.25 ug via ORAL
  Filled 2014-03-20 (×3): qty 1

## 2014-03-20 NOTE — ED Notes (Signed)
Family reported PT was confused and this occurs when his CO2 is high. Pt reported he felt as if he was sleeping while he was awake. Pt reports he uses  O2 at 2 liters via nasal cannula at home.

## 2014-03-20 NOTE — H&P (Signed)
PULMONARY / CRITICAL CARE MEDICINE   Name: Russell Bowers MRN: [id number] DOB: 1951/08/24    ADMISSION DATE:  03/20/2014  PRIMARY SERVICE:  PCCM  CHIEF COMPLAINT:  Hypercarbic respiratory failure  BRIEF PATIENT DESCRIPTION:  63 years old male with PMH relevant for DM, post kidney transplant, CHF and recurrent pneumonias. He has severe restrictive lung disease of unclear etiology. He has chronic hypercarbic respiratory failure and now he presents with worsening hypercarbia requiring BiPAP. No evidence of acute infectious process.   SIGNIFICANT EVENTS / STUDIES:  Chest X ray: - Diminished lung volumes. Increased interstitial markings.   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  He quit smoking 1994 (20-pack-year)with a PMH of renal transplant 11/2011 admitted 03/2013 with acute hypercarbic resp failure in setting H Flu HCAP (similar recent admit February at [facility]) and likely decompensated OSA. Required mechanical ventilation Course c/b acute on chronic renal insufficiency, poor glycemic control and back abscess with hx peptostreptococcus.  PFTs showed severe restriction with a ratio 77, FVC 1.35-32%, FEV1 1.04-32% and TLC of 3.0 L-46% with DLCO at 52% that corrects for alveolar volumes suggestive of extra parenchymal restriction. Stress test showed minimal mobility of diaphragms.  Polysomnogram at [facility] in 02/2012 showed moderate obstructive sleep apnea with AHI of 25 events per hour, lowest desaturation of 71%. Unfortunately he did not tolerate CPAP on a subsequent titration study. Although he was tried on auto CPAP he did not tolerate the full face mask but now states that he is willing to try this again.  Nocturnal oximetry showed severe desaturation on 2 L of oxygen with a pattern suggestive hypoventilation, he spent 393  minutes with sats less than 88%.  Today he comes with increased somnolence and confusion that started last night. Denies any symptoms of acute infection including fever, chest pain, worsening SOB, worsening cough or sputum production. Denies any other symptoms including nausea, vomiting, diarrhea or urinary symptoms.  At the time of my examination the patient is awake, alert, oriented x 3 and hemodynamically stable. ABG: 7.29 / 95 / 67   PAST MEDICAL HISTORY :  Past Medical History  Diagnosis Date  . Diabetes mellitus without complication   . Cancer     Prostate cancer  . Renal failure   . Detached retina   . Hypertension   . Cellulitis and abscess of unspecified site 2014    lower back  . ADHD (attention deficit hyperactivity disorder)   . Asthma   . Cellulitis and abscess of trunk 2014    history of back abscess  . Elevated troponin   . Shortness of breath   . H/O kidney transplant    Past Surgical History  Procedure Laterality Date  . Kidney transplant  12/01/2011  . Prostectomy  11/2010  . Retinal detachment surgery  06/2002   Prior to Admission medications   Medication Sig Start Date End Date Taking? Authorizing Provider  allopurinol (ZYLOPRIM) 300 MG tablet Take 300 mg by mouth daily.  03/11/12  Yes Historical Provider, MD  aspirin EC 81 MG tablet Take 81 mg by mouth daily.    Yes Historical Provider, MD  calcitRIOL (ROCALTROL) 0.25 MCG capsule Take 0.25 mcg by mouth daily.    Yes Historical Provider, MD  cetirizine (ZYRTEC) 10 MG tablet Take 10 mg by mouth daily.   Yes Historical Provider, MD  cinacalcet (  SENSIPAR) 30 MG tablet Take 30 mg by mouth daily.    Yes Historical Provider, MD  diphenhydrAMINE (BENADRYL) 25 MG tablet Take 25 mg by mouth at bedtime as needed for sleep.    Yes Historical Provider, MD  furosemide (LASIX) 80 MG tablet Take 2 tablets (160 mg total) by mouth 3 (three) times daily. 04/21/13  Yes Marijean Heath, NP  glipiZIDE (GLUCOTROL XL) 10 MG 24  hr tablet Take 10 mg by mouth 2 (two) times daily.  03/11/12  Yes Historical Provider, MD  insulin glargine (LANTUS) 100 UNIT/ML injection Inject 0.15 mLs (15 Units total) into the skin at bedtime. 04/21/13  Yes Marijean Heath, NP  insulin regular (NOVOLIN R,HUMULIN R) 100 units/mL injection Inject 0-8 Units into the skin 3 (three) times daily as needed for high blood sugar (per sliding scale).    Yes Historical Provider, MD  metoprolol (LOPRESSOR) 50 MG tablet Take 25 mg by mouth 2 (two) times daily.   Yes Historical Provider, MD  mycophenolate (MYFORTIC) 180 MG EC tablet Take 360 mg by mouth 2 (two) times daily.   Yes Historical Provider, MD  omeprazole (PRILOSEC) 20 MG capsule Take 20 mg by mouth daily.   Yes Historical Provider, MD  pravastatin (PRAVACHOL) 80 MG tablet Take 80 mg by mouth at bedtime.    Yes Historical Provider, MD  sodium chloride (OCEAN) 0.65 % nasal spray Place 3-4 sprays into the nose at bedtime.   Yes Historical Provider, MD  tacrolimus (PROGRAF) 1 MG capsule Take 2-3 mg by mouth 2 (two) times daily. Take 3 capsules (3 mg) every morning and 2 capsules (2 mg) every night   Yes Historical Provider, MD  B-D ULTRAFINE III SHORT PEN 31G X 8 MM MISC  12/28/12   Historical Provider, MD   No Known Allergies  FAMILY HISTORY:  Family History  Problem Relation Age of Onset  . Diabetes Mother   . Heart disease Maternal Grandfather   . Heart disease Maternal Grandmother    SOCIAL HISTORY:  reports that he quit smoking about 21 years ago. His smoking use included Cigarettes. He has a 21 pack-year smoking history. He has never used smokeless tobacco. He reports that he drinks alcohol. He reports that he does not use illicit drugs.  REVIEW OF SYSTEMS:  All systems reviewed and found negative except for what I mentioned in the HPI.   SUBJECTIVE:   VITAL SIGNS: Temp:  [98.6 F (37 C)] 98.6 F (37 C) (03/29 1645) Pulse Rate:  [57-107] 106 (03/29 1926) Resp:  [19-37] 22 (03/29  1926) BP: (114-133)/(52-71) 128/58 mmHg (03/29 1926) SpO2:  [72 %-98 %] 96 % (03/29 1926) FiO2 (%):  [40 %] 40 % (03/29 1856) HEMODYNAMICS:   VENTILATOR SETTINGS: Vent Mode:  [-]  FiO2 (%):  [40 %] 40 % INTAKE / OUTPUT: Intake/Output   None     PHYSICAL EXAMINATION: General: Pleasant male patient in no acute distress. Wearing BiPAP.  Eyes: Anicteric sclerae. ENT: Oropharynx clear. Moist mucous membranes. No thrush Lymph: No cervical, supraclavicular, or axillary lymphadenopathy. Heart: Normal S1, S2. No murmurs, rubs, or gallops appreciated. No bruits, equal pulses. Lungs: Normal excursion, no dullness to percussion. Good air movement bilaterally, without wheezes or crackles. Normal upper airway sounds without evidence of stridor. Abdomen: Abdomen soft, non-tender and not distended, normoactive bowel sounds. No hepatosplenomegaly or masses. Musculoskeletal: No clubbing or synovitis. Skin: No rashes or lesions Neuro: No focal neurologic deficits.   LABS:  CBC  Recent Labs  Lab 03/20/14 1846  WBC 10.4  HGB 12.9*  HCT 45.3  PLT 143*   Coag's No results found for this basename: APTT, INR,  in the last 168 hours BMET No results found for this basename: NA, K, CL, CO2, BUN, CREATININE, GLUCOSE,  in the last 168 hours Electrolytes No results found for this basename: CALCIUM, MG, PHOS,  in the last 168 hours Sepsis Markers No results found for this basename: LATICACIDVEN, PROCALCITON, O2SATVEN,  in the last 168 hours ABG  Recent Labs Lab 03/20/14 1829  PHART 7.295*  PCO2ART 95.5*  PO2ART 67.0*   Liver Enzymes No results found for this basename: AST, ALT, ALKPHOS, BILITOT, ALBUMIN,  in the last 168 hours Cardiac Enzymes No results found for this basename: TROPONINI, PROBNP,  in the last 168 hours Glucose No results found for this basename: GLUCAP,  in the last 168 hours  Imaging Dg Chest 2 View  03/20/2014   CLINICAL DATA:  Persistent cough and shortness of  breath for several months  EXAM: CHEST  2 VIEW  COMPARISON:  Prior chest x-ray 01/21/2014  FINDINGS: Interval development of a pulmonary vascular congestion and mild interstitial edema. No focal airspace consolidation. Slightly increased right middle lobe atelectasis. Cardiac and mediastinal contours are unchanged. No pneumothorax. Overall, inspiratory volumes are low. Bilateral lower lobe opacities similar to slightly increased compared to prior and favored to reflect atelectasis and/ scarring. . Trace atherosclerotic calcifications noted in the aorta. No acute osseous abnormality.  IMPRESSION: 1. Increasing pulmonary vascular congestion now with mild interstitial edema suggesting early/mild CHF. 2. Increased right middle lobe linear opacity favored to reflect atelectasis. 3. Inspiratory volumes are low. 4. Bibasilar atelectasis and/or scarring slightly increased compared to 01/21/2014. 5. Aortic atherosclerosis.   Electronically Signed   By: Jacqulynn Cadet M.D.   On: 03/20/2014 18:47     CXR:  - Diminished lung volumes. Increased interstitial markings.   ASSESSMENT / PLAN:  PULMONARY A: 1) Acute on chronic hypercarbic respiratory failure. Factors contributing are smoking history, OSA, restrictive lung disease.  2) Restrictive lung disease of unclear etiology. Possible multifactorial including respiratory muscle weakness and obesity.  P:   - Improving on BiPAP - Titrate FiO2 to keep O2 sat 94% - No antibiotics.  - Consider chest CT to evaluate for parenchymal causes of restrictive lung disease.  - Follow VBG in 2 hrs  CARDIOVASCULAR A:  1) Congestive heart failure with preserved LVEF (60 to 65%). Increased interstitial markings on chest X ray.  P:  - We will give a one time dose of IV lasix and then continue his PO dose.  - Continue home metoprolol - Continue statin - Continue aspirin  RENAL A:   1) Post kidney transplant P:   - Continue Tacrolimus and Myfortic - Continue home  lasix - Continue Sensipar  GASTROINTESTINAL A:   1) GERD P:   - Continue omeprazole  HEMATOLOGIC A:   1) Anemia, likely of chronic disease P:  - will follow CBC  INFECTIOUS A:   1) No evidence of acute infectious process. P:   - No antibiotics  ENDOCRINE A:   1) DM P:   - Novolog sliding scale.  - - Will resume home Lantus in am with diet  NEUROLOGIC A:   1) Altered metal status secondary to hypercarbia. Improved P:     TODAY'S SUMMARY:   I have personally obtained a history, examined the patient, evaluated laboratory and imaging results, formulated the assessment and plan and placed  orders. CRITICAL CARE:  Critical Care Time devoted to patient care services described in this note is 60 minutes.   Waynetta Pean, MD Pulmonary and [place] Pager: [phone]  03/20/2014, 7:57 PM

## 2014-03-20 NOTE — ED Notes (Signed)
BI-PAP started per RT.

## 2014-03-20 NOTE — Progress Notes (Signed)
Patient transferred from ER by ER RN via stretcher on 2L O2 Russell Bowers. Wife at bedside. Oriented to unit and room, instructed on callbell and placed at side. Once patient moved to bed, patient desat 84%. Increased O2 to 4L, patient recovered to 94%. Will continue to monitor.

## 2014-03-20 NOTE — Progress Notes (Signed)
Patient taken off Bipap and placed on nasal cannula at this time. Tolerating well, RT will continue to monitor.

## 2014-03-20 NOTE — ED Notes (Signed)
RT at bed side to draw BG.

## 2014-03-20 NOTE — ED Provider Notes (Signed)
CSN: [id number]     Arrival date & time 03/20/14  1635 History   First MD Initiated Contact with Patient 03/20/14 1647     No chief complaint on file.     HPI Family reported PT was confused and this occurs when his CO2 is high. Pt reported he felt as if he was sleeping while he was awake. Pt reports he uses O2 at 2 liters via nasal cannula at home.  Past Medical History  Diagnosis Date  . Diabetes mellitus without complication   . Cancer     Prostate cancer  . Renal failure   . Detached retina   . Hypertension   . Cellulitis and abscess of unspecified site 2014    lower back  . ADHD (attention deficit hyperactivity disorder)   . Cellulitis and abscess of trunk 2014    history of back abscess  . Elevated troponin   . Shortness of breath   . H/O kidney transplant    Past Surgical History  Procedure Laterality Date  . Kidney transplant  12/01/2011  . Prostectomy  11/2010  . Retinal detachment surgery  06/2002   Family History  Problem Relation Age of Onset  . Diabetes Mother   . Heart disease Maternal Grandfather   . Heart disease Maternal Grandmother    History  Substance Use Topics  . Smoking status: Former Smoker -- 1.00 packs/day for 21 years    Types: Cigarettes    Quit date: 03/16/1993  . Smokeless tobacco: Never Used  . Alcohol Use: 0.0 oz/week    1-2 Glasses of wine per week    Review of Systems  All other systems reviewed and are negative.      Allergies  Review of patient's allergies indicates no known allergies.  Home Medications   Current Outpatient Rx  Name  Route  Sig  Dispense  Refill  . allopurinol (ZYLOPRIM) 300 MG tablet   Oral   Take 300 mg by mouth daily.          [name] aspirin EC 81 MG tablet   Oral   Take 81 mg by mouth daily.          . calcitRIOL (ROCALTROL) 0.25 MCG capsule   Oral   Take 0.25 mcg by mouth daily.          . cetirizine (ZYRTEC) 10 MG tablet   Oral   Take 10 mg by mouth daily.         . cinacalcet  (SENSIPAR) 30 MG tablet   Oral   Take 30 mg by mouth daily.          . furosemide (LASIX) 80 MG tablet   Oral   Take 2 tablets (160 mg total) by mouth 3 (three) times daily.   180 tablet   0   . glipiZIDE (GLUCOTROL XL) 10 MG 24 hr tablet   Oral   Take 10 mg by mouth 2 (two) times daily.          . insulin regular (NOVOLIN R,HUMULIN R) 100 units/mL injection   Subcutaneous   Inject 0-8 Units into the skin 3 (three) times daily as needed for high blood sugar (per sliding scale).          . metoprolol (LOPRESSOR) 50 MG tablet   Oral   Take 25 mg by mouth 2 (two) times daily.         . mycophenolate (MYFORTIC) 180 MG EC tablet   Oral   Take  360 mg by mouth 2 (two) times daily.         [name] omeprazole (PRILOSEC) 20 MG capsule   Oral   Take 20 mg by mouth daily.         . pravastatin (PRAVACHOL) 80 MG tablet   Oral   Take 80 mg by mouth at bedtime.          . sodium chloride (OCEAN) 0.65 % nasal spray   Nasal   Place 3-4 sprays into the nose at bedtime.         . tacrolimus (PROGRAF) 1 MG capsule   Oral   Take 2-3 mg by mouth 2 (two) times daily. Take 3 capsules (3 mg) every morning and 2 capsules (2 mg) every night         . B-D ULTRAFINE III SHORT PEN 31G X 8 MM MISC                BP 110/70  Pulse 93  Temp(Src) 97.7 F (36.5 C) (Tympanic)  Resp 16  Ht 5\' 9"  (1.753 m)  Wt 223 lb 12.3 oz (101.5 kg)  BMI 33.03 kg/m2  SpO2 93% Physical Exam  Nursing note and vitals reviewed. Constitutional: He is oriented to person, place, and time. He appears well-developed and well-nourished. No distress.  HENT:  Head: Normocephalic and atraumatic.  Eyes: Pupils are equal, round, and reactive to light.  Neck: Normal range of motion.  Cardiovascular: Normal rate and intact distal pulses.   Pulmonary/Chest: Effort normal. No respiratory distress. He has decreased breath sounds in the right upper field, the right middle field, the right lower field, the left  upper field, the left middle field and the left lower field.  Breath sounds diminished bilaterally  Abdominal: Normal appearance. He exhibits no distension.  Musculoskeletal: Normal range of motion.  Neurological: He is alert and oriented to person, place, and time. No cranial nerve deficit.  Skin: Skin is warm and dry. No rash noted.  Psychiatric: He has a normal mood and affect. His behavior is normal.    ED Course  Procedures (including critical care time) Labs Review   I-Stat arterial blood gas, ED (Final result) Abnormal Component (Lab Inquiry)      Collection Time Result Time  PH ART  PCO2 ART   PO2 ART    Bicarbonate     TCO2  7.295 (L)  95.5 (HH)      67.0 (L)         46.4 (H)            49      Imaging Review IMPRESSION: 1. Increasing pulmonary vascular congestion now with mild interstitial edema suggesting early/mild CHF. 2. Increased right middle lobe linear opacity favored to reflect atelectasis. 3. Inspiratory volumes are low. 4. Bibasilar atelectasis and/or scarring slightly increased compared to 01/21/2014. 5. Aortic atherosclerosis.    EKG Interpretation Sinus rhythm Biatrial enlargement Borderline right axis deviation Abnormal R-wave progression, late transition Nonspecific T abnrm, anterolateral leads Minimal ST elevation, inferior leads    PCCM consulted Pt placed on BiPap and admitted  CRITICAL CARE Performed by: Leonard Schwartz L Total critical care time: 30 min Critical care time was exclusive of separately billable procedures and treating other patients. Critical care was necessary to treat or prevent imminent or life-threatening deterioration. Critical care was time spent personally by me on the following activities: development of treatment plan with patient and/or surrogate as well as nursing, discussions with consultants, evaluation of patient's  response to treatment, examination of patient, obtaining history from patient or surrogate, ordering  and performing treatments and interventions, ordering and review of laboratory studies, ordering and review of radiographic studies, pulse oximetry and re-evaluation of patient's condition.   MDM   Final diagnoses:  Acute and chronic respiratory failure with hypercapnia  Chronic diastolic heart failure  Chronic respiratory failure  DM2 (diabetes mellitus, type 2)  Dyspnea        Dot Lanes, MD 03/27/14 2234

## 2014-03-21 ENCOUNTER — Encounter (HOSPITAL_COMMUNITY): Payer: Self-pay | Admitting: *Deleted

## 2014-03-21 ENCOUNTER — Inpatient Hospital Stay (HOSPITAL_COMMUNITY): Payer: Medicare Other

## 2014-03-21 DIAGNOSIS — E119 Type 2 diabetes mellitus without complications: Secondary | ICD-10-CM

## 2014-03-21 DIAGNOSIS — Z9981 Dependence on supplemental oxygen: Secondary | ICD-10-CM | POA: Diagnosis not present

## 2014-03-21 DIAGNOSIS — I5032 Chronic diastolic (congestive) heart failure: Secondary | ICD-10-CM

## 2014-03-21 DIAGNOSIS — Z940 Kidney transplant status: Secondary | ICD-10-CM | POA: Diagnosis not present

## 2014-03-21 DIAGNOSIS — J9620 Acute and chronic respiratory failure, unspecified whether with hypoxia or hypercapnia: Secondary | ICD-10-CM | POA: Diagnosis not present

## 2014-03-21 DIAGNOSIS — I509 Heart failure, unspecified: Secondary | ICD-10-CM | POA: Diagnosis not present

## 2014-03-21 DIAGNOSIS — I712 Thoracic aortic aneurysm, without rupture: Secondary | ICD-10-CM | POA: Diagnosis not present

## 2014-03-21 DIAGNOSIS — R918 Other nonspecific abnormal finding of lung field: Secondary | ICD-10-CM | POA: Diagnosis not present

## 2014-03-21 DIAGNOSIS — I10 Essential (primary) hypertension: Secondary | ICD-10-CM | POA: Diagnosis not present

## 2014-03-21 LAB — GLUCOSE, CAPILLARY
GLUCOSE-CAPILLARY: 140 mg/dL — AB (ref 70–99)
GLUCOSE-CAPILLARY: 256 mg/dL — AB (ref 70–99)
Glucose-Capillary: 127 mg/dL — ABNORMAL HIGH (ref 70–99)
Glucose-Capillary: 52 mg/dL — ABNORMAL LOW (ref 70–99)
Glucose-Capillary: 94 mg/dL (ref 70–99)
Glucose-Capillary: 165 mg/dL — ABNORMAL HIGH (ref 70–99)
Glucose-Capillary: 185 mg/dL — ABNORMAL HIGH (ref 70–99)

## 2014-03-21 LAB — CBC
HCT: 43.2 % (ref 39.0–52.0)
HEMOGLOBIN: 12.2 g/dL — AB (ref 13.0–17.0)
MCH: 28.4 pg (ref 26.0–34.0)
MCHC: 28.2 g/dL — ABNORMAL LOW (ref 30.0–36.0)
MCV: 100.7 fL — ABNORMAL HIGH (ref 78.0–100.0)
PLATELETS: 150 10*3/uL (ref 150–400)
RBC: 4.29 MIL/uL (ref 4.22–5.81)
RDW: 14.5 % (ref 11.5–15.5)
WBC: 7.1 10*3/uL (ref 4.0–10.5)

## 2014-03-21 LAB — BASIC METABOLIC PANEL
BUN: 37 mg/dL — ABNORMAL HIGH (ref 6–23)
CO2: 43 meq/L — AB (ref 19–32)
Calcium: 9.8 mg/dL (ref 8.4–10.5)
Chloride: 96 mEq/L (ref 96–112)
Creatinine, Ser: 1.59 mg/dL — ABNORMAL HIGH (ref 0.50–1.35)
GFR calc Af Amer: 52 mL/min — ABNORMAL LOW (ref >90)
GFR calc non Af Amer: 45 mL/min — ABNORMAL LOW (ref >90)
GLUCOSE: 45 mg/dL — AB (ref 70–99)
POTASSIUM: 3.8 meq/L (ref 3.7–5.3)
SODIUM: 145 meq/L (ref 137–147)

## 2014-03-21 LAB — MRSA PCR SCREENING: MRSA by PCR: NEGATIVE

## 2014-03-21 MED ORDER — DEXTROSE 50 % IV SOLN
50.0000 mL | Freq: Once | INTRAVENOUS | Status: AC | PRN
  Administered 2014-03-21: 50 mL via INTRAVENOUS

## 2014-03-21 MED ORDER — DEXTROSE 50 % IV SOLN
INTRAVENOUS | Status: AC
  Filled 2014-03-21: qty 50

## 2014-03-21 NOTE — Progress Notes (Signed)
Instructed patient again of NIF and VC. Patient tolerated well. NIF of 40cm. VC of 1050cc's.

## 2014-03-21 NOTE — Progress Notes (Signed)
Inpatient Diabetes Program Recommendations  AACE/ADA: New Consensus Statement on Inpatient Glycemic Control (2013)  Target Ranges:  Prepandial:   less than 140 mg/dL      Peak postprandial:   less than 180 mg/dL (1-2 hours)      Critically ill patients:  140 - 180 mg/dL   Hypoglycemia early am today. Pt had been on ICU Standard correction q 4hrs. Now on moderate q 4hrs" Inpatient Diabetes Program Recommendations Correction (SSI): Please change correction to q 4 hrs tidwc now that pt is taking po's.  Thank you, Rosita Kea, RN, CNS, Diabetes Coordinator [phone])

## 2014-03-21 NOTE — Progress Notes (Signed)
Utilization review completed.  

## 2014-03-21 NOTE — H&P (Signed)
PULMONARY / CRITICAL CARE MEDICINE   Name: Russell Bowers MRN: [id number] DOB: Jun 07, 1951    ADMISSION DATE:  03/20/2014  PRIMARY SERVICE:  PCCM  CHIEF COMPLAINT:  Hypercarbic respiratory failure  BRIEF PATIENT DESCRIPTION:  63 years old male with PMH relevant for DM, post kidney transplant, CHF and recurrent pneumonias. He has severe restrictive lung disease of unclear etiology. He has chronic hypercarbic respiratory failure and now he presents with worsening hypercarbia requiring BiPAP. No evidence of acute infectious process.   SIGNIFICANT EVENTS / STUDIES:  Chest X ray: - Diminished lung volumes. Increased interstitial markings.   LINES / TUBES: - Peripheral IV's  CULTURES:   ANTIBIOTICS:    SUBJECTIVE:   VITAL SIGNS: Temp:  [97.7 F (36.5 C)-98.9 F (37.2 C)] 98.6 F (37 C) (03/30 0745) Pulse Rate:  [57-109] 64 (03/30 0942) Resp:  [18-37] 27 (03/30 0400) BP: (101-138)/(52-78) 109/54 mmHg (03/30 0942) SpO2:  [72 %-100 %] 94 % (03/30 0745) FiO2 (%):  [40 %] 40 % (03/30 0400) Weight:  [100.2 kg (220 lb 14.4 oz)] 100.2 kg (220 lb 14.4 oz) (03/30 0500) HEMODYNAMICS:   VENTILATOR SETTINGS: Vent Mode:  [-]  FiO2 (%):  [40 %] 40 % INTAKE / OUTPUT: Intake/Output     03/29 0701 - 03/30 0700 03/30 0701 - 03/31 0700   P.O. 120    Total Intake(mL/kg) 120 (1.2)    Urine (mL/kg/hr) 700 175 (0.4)   Total Output 700 175   Net -580 -175          PHYSICAL EXAMINATION: General: comfortable on BIPAP HEENT: NCAT PULM: crackles LLL otherwise clear CV: Regular with PAC AB: obese, nd, nt Ext: trace edema Neuro: A&Ox3, maew  LABS:  CBC  Recent Labs Lab 03/20/14 1846 03/20/14 2114 03/21/14 0300  WBC 10.4 8.9 7.1  HGB 12.9* 12.2* 12.2*  HCT 45.3 43.3 43.2  PLT 143* 137* 150   Coag's No results found for this basename: APTT, INR,  in the last 168 hours BMET  Recent Labs Lab 03/20/14 1846 03/20/14 2114 03/21/14 0300  NA 144  --  145  K 4.5  --  3.8   CL 94*  --  96  CO2 43*  --  43*  BUN 37*  --  37*  CREATININE 1.52* 1.61* 1.59*  GLUCOSE 218*  --  45*   Electrolytes  Recent Labs Lab 03/20/14 1846 03/21/14 0300  CALCIUM 9.7 9.8   Sepsis Markers No results found for this basename: LATICACIDVEN, PROCALCITON, O2SATVEN,  in the last 168 hours ABG  Recent Labs Lab 03/20/14 1829  PHART 7.295*  PCO2ART 95.5*  PO2ART 67.0*   Liver Enzymes  Recent Labs Lab 03/20/14 1846  AST 10  ALT 8  ALKPHOS 116  BILITOT 0.4  ALBUMIN 3.7   Cardiac Enzymes  Recent Labs Lab 03/20/14 1846  PROBNP 1788.0*   Glucose  Recent Labs Lab 03/20/14 2055 03/20/14 2202 03/20/14 2323 03/21/14 0413 03/21/14 0506 03/21/14 0810  GLUCAP 180* 147* 127* 52* 140* 94    Imaging    CXR:  - Diminished lung volumes. Increased interstitial markings.   ASSESSMENT / PLAN:  PULMONARY A: 1) Acute on chronic hypercarbic respiratory failure> primarily hypercarbic respiratory failure, no clear airflow obstruction on PFT, had diaphragm weakness on 2015 fluoroscopy; obesity clearly contributing; > did great on BIPAP, will need this at home > has resting hypercarbia > chronic hypoxemia (currently on 2L at home) P:   - NIF today - Needs home BiPAP -  CT chest today to rule out ILD (unlikely) - needs outpatient neurology evaluation for diaphragm weakness   CARDIOVASCULAR A:  1) Congestive heart failure with preserved LVEF (60 to 65%). Perhaps slightly volume up on admission (but weight stable from prior visits) P:  - Continue his PO lasix dose.  - Continue home metoprolol - Continue statin - Continue aspirin  RENAL A:   1) Post kidney transplant > stable renal function P:   - Continue Tacrolimus and Myfortic - Continue home lasix - Continue Sensipar  GASTROINTESTINAL A:   1) GERD P:   - Continue omeprazole  ENDOCRINE A:   1) DM P:   - Novolog sliding scale.  - - Will resume home Lantus in am with diet  NEUROLOGIC A:    1) Altered metal status secondary to hypercarbia. Improved 2) Diaphragm weakness P:   -BIPAP -outpatient neurology work up  TODAY'S SUMMARY: Start making arrangements for home BIPAP (he and wife have been noting more fatigue, dyspnea consistent with hypercarbia).  Needs outpatient neurology work up for diaphragm weakness.   [name] PCCM Pager: [phone] Cell: [phone] If no response, call [phone]  03/21/2014, 11:03 AM

## 2014-03-21 NOTE — Care Management Note (Signed)
    Page 1 of 2   03/23/2014     10:46:41 AM   CARE MANAGEMENT NOTE 03/23/2014  Patient:  Russell Bowers, Russell Bowers   Account Number:  [id number]  Date Initiated:  03/21/2014  Documentation initiated by:  Marvetta Gibbons  Subjective/Objective Assessment:   Pt admitted with acute resp. failure     Action/Plan:   PTA pt lived at home with spouse- has home 02 at home- NCM to follow for d/c needs   Anticipated DC Date:  03/23/2014   Anticipated DC Plan:  [place]  CM consult      PAC Choice  DURABLE MEDICAL EQUIPMENT   Choice offered to / List presented to:     DME arranged  BIPAP      DME agency  [facility].        Status of service:  Completed, signed off Medicare Important Message given?   (If response is "NO", the following Medicare IM given date fields will be blank) Date Medicare IM given:   Date Additional Medicare IM given:    Discharge Disposition:  HOME/SELF CARE  Per UR Regulation:  Reviewed for med. necessity/level of care/duration of stay  If discussed at [place] of Stay Meetings, dates discussed:    Comments:  03/23/14 [place] RN,BSN [id number] with [facility] is working on [employer] and stated this will be delivered to patient 's home, patient is for dc today. Patient states he has home oxygen and his spouse will be bringing oxygen with her.  03/22/14- 18- Marvetta Gibbons RN, BSN [phone] Spoke with [facility] hospital lesion regarding home bipap needs- per conversation - pt was mistaken about having an order already for home BIPAP from MD office- [place] did not have any order on file- pt was due for an 02 update at home and this can be rescheduled- [facility] had order in chart for home BIPAP- will start working on referral today- 1630- update- have worked throughout the day with Joellen Jersey from [facility] regarding needed documentation for home BIPAP- information supplied ABG results and documentation of sats during night on bipap here in  hospital- also gave [facility] sleep study date which was confirmed with [facility] for March 29, 2014. per Joellen Jersey with [facility] documentation and orders have been submitted and she is awaiting return call from main office at [facility] regarding approval for BIPAP- have spoken with wife and explained process and planned discharge for tomorrow 03/23/14 once BIPAP has been approved. NCM to f/u in am with [facility] regarding BIPAP- pt tx to [id number] - 5W CM updated regarding d/c needs  03/21/14- 1500- Marvetta Gibbons RN, BSN [phone] Referral for home Bipap received- spoke with pt at bedside- per conversation pt states that he has home 02 with [facility] and that a home BIPAP had been ordered by Dr. Gustavus Bryant office with [facility]- it was scheduled by [facility] to have someone come out to pt's home today- but pt ended up being admitted to hospital- message left with Katie with [facility] to f/u on BIPAP order with [facility] - NCM to cont. to follow for further needs with BIPAP- pt does state that he has a sleep study scheduled for next week.

## 2014-03-21 NOTE — Progress Notes (Signed)
Hypoglycemic Event  CBG: 52  Treatment: D50 IV 50 mL  Symptoms: None  Follow-up CBG: Time:0500 CBG Result:140  Possible Reasons for Event: Inadequate meal intake  Comments/MD notified: Dr. Koren Bound, RN   Remember to initiate Hypoglycemia Order Set & complete

## 2014-03-21 NOTE — Progress Notes (Signed)
RCS  NIF and VC maneuvers done with patient. I explained how to perform them and that this would be done twice daily.  NIF=42cm and VC=1080cc's. Tolerated procedure well.

## 2014-03-22 DIAGNOSIS — R0989 Other specified symptoms and signs involving the circulatory and respiratory systems: Secondary | ICD-10-CM

## 2014-03-22 DIAGNOSIS — J9620 Acute and chronic respiratory failure, unspecified whether with hypoxia or hypercapnia: Secondary | ICD-10-CM | POA: Diagnosis not present

## 2014-03-22 DIAGNOSIS — R0609 Other forms of dyspnea: Secondary | ICD-10-CM

## 2014-03-22 DIAGNOSIS — E119 Type 2 diabetes mellitus without complications: Secondary | ICD-10-CM | POA: Diagnosis not present

## 2014-03-22 DIAGNOSIS — I5032 Chronic diastolic (congestive) heart failure: Secondary | ICD-10-CM | POA: Diagnosis not present

## 2014-03-22 LAB — GLUCOSE, CAPILLARY
GLUCOSE-CAPILLARY: 103 mg/dL — AB (ref 70–99)
GLUCOSE-CAPILLARY: 168 mg/dL — AB (ref 70–99)
GLUCOSE-CAPILLARY: 235 mg/dL — AB (ref 70–99)
GLUCOSE-CAPILLARY: 57 mg/dL — AB (ref 70–99)
Glucose-Capillary: 53 mg/dL — ABNORMAL LOW (ref 70–99)
Glucose-Capillary: 116 mg/dL — ABNORMAL HIGH (ref 70–99)
Glucose-Capillary: 219 mg/dL — ABNORMAL HIGH (ref 70–99)
Glucose-Capillary: 185 mg/dL — ABNORMAL HIGH (ref 70–99)

## 2014-03-22 LAB — BASIC METABOLIC PANEL
BUN: 42 mg/dL — ABNORMAL HIGH (ref 6–23)
CALCIUM: 9.3 mg/dL (ref 8.4–10.5)
CHLORIDE: 96 meq/L (ref 96–112)
CO2: 42 mEq/L (ref 19–32)
CREATININE: 2.04 mg/dL — AB (ref 0.50–1.35)
GFR calc Af Amer: 39 mL/min — ABNORMAL LOW (ref >90)
GFR calc non Af Amer: 33 mL/min — ABNORMAL LOW (ref >90)
GLUCOSE: 49 mg/dL — AB (ref 70–99)
Potassium: 3.6 mEq/L — ABNORMAL LOW (ref 3.7–5.3)
SODIUM: 145 meq/L (ref 137–147)

## 2014-03-22 LAB — CBC
HEMATOCRIT: 40 % (ref 39.0–52.0)
Hemoglobin: 11.6 g/dL — ABNORMAL LOW (ref 13.0–17.0)
MCH: 28.4 pg (ref 26.0–34.0)
MCHC: 29 g/dL — ABNORMAL LOW (ref 30.0–36.0)
MCV: 97.8 fL (ref 78.0–100.0)
Platelets: 139 10*3/uL — ABNORMAL LOW (ref 150–400)
RBC: 4.09 MIL/uL — AB (ref 4.22–5.81)
RDW: 14.6 % (ref 11.5–15.5)
WBC: 6.5 10*3/uL (ref 4.0–10.5)

## 2014-03-22 LAB — PRO B NATRIURETIC PEPTIDE: PRO B NATRI PEPTIDE: 942.1 pg/mL — AB (ref 0–125)

## 2014-03-22 MED ORDER — INSULIN ASPART 100 UNIT/ML ~~LOC~~ SOLN: 0.0000 [IU] | Freq: Three times a day (TID) | SUBCUTANEOUS | Status: DC

## 2014-03-22 MED ORDER — INSULIN ASPART 100 UNIT/ML ~~LOC~~ SOLN
0.0000 [IU] | Freq: Three times a day (TID) | SUBCUTANEOUS | Status: DC
  Administered 2014-03-22 – 2014-03-23 (×2): 3 [IU] via SUBCUTANEOUS

## 2014-03-22 NOTE — Progress Notes (Signed)
   CARE MANAGEMENT NOTE 03/22/2014  Patient:  JULIA, DEC   Account Number:  [id number]  Date Initiated:  03/21/2014  Documentation initiated by:  Marvetta Gibbons  Subjective/Objective Assessment:   Pt admitted with acute resp. failure     Action/Plan:   PTA pt lived at home with spouse- has home 02 at home- NCM to follow for d/c needs   Anticipated DC Date:  03/23/2014   Anticipated DC Plan:  [place]  CM consult      PAC Choice  DURABLE MEDICAL EQUIPMENT   Choice offered to / List presented to:     DME arranged  BIPAP      DME agency  [place].        Status of service:  In process, will continue to follow Medicare Important Message given?   (If response is "NO", the following Medicare IM given date fields will be blank) Date Medicare IM given:   Date Additional Medicare IM given:    Discharge Disposition:    Per UR Regulation:  Reviewed for med. necessity/level of care/duration of stay  If discussed at [place] of Stay Meetings, dates discussed:    Comments:  03/22/14- 34- Marvetta Gibbons RN, BSN [phone] Spoke with [facility] hospital lesion regarding home bipap needs- per conversation - pt was mistaken about having an order already for home BIPAP from MD office- [place] did not have any order on file- pt was due for an 02 update at home and this can be rescheduled- [name] had order in chart for home BIPAP- will start working on referral today- 1630- update- have worked throughout the day with Joellen Jersey from [facility] regarding needed documentation for home BIPAP- information supplied ABG results and documentation of sats during night on bipap here in hospital- also gave [facility] sleep study date which was confirmed with [place] for March 29, 2014. per Joellen Jersey with [facility] documentation and orders have been submitted and she is awaiting return call from main office at [facility] regarding approval for BIPAP- have spoken with wife and explained  process and planned discharge for tomorrow 03/23/14 once BIPAP has been approved. NCM to f/u in am with [facility] regarding BIPAP- pt tx to [id number] - 5W CM updated regarding d/c needs  03/21/14- 1500- Marvetta Gibbons RN, BSN [phone] Referral for home Bipap received- spoke with pt at bedside- per conversation pt states that he has home 02 with [facility] and that a home BIPAP had been ordered by Dr. Gustavus Bryant office with [facility]- it was scheduled by [facility] to have someone come out to pt's home today- but pt ended up being admitted to hospital- message left with Katie with [facility] to f/u on BIPAP order with [facility] - NCM to cont. to follow for further needs with BIPAP- pt does state that he has a sleep study scheduled for next week.

## 2014-03-22 NOTE — Progress Notes (Signed)
Patient placed on BiPAP auto 20/9 with 4L bled in. Patient tolerating well at this time.

## 2014-03-22 NOTE — Progress Notes (Signed)
LEOMAR BRACKENS [id number]  Transfer Data: 03/22/2014 4:09 PM  Attending Provider: Carin Hock, MD  [id number] Marigene Ehlers, MD  Code Status: Full  TARYLL SZYMBORSKI is a 63 y.o. male patient transferred from 3S  -No acute distress noted.  -No complaints of shortness of breath.  -No complaints of chest pain.   Blood pressure 109/66, pulse 91, temperature 98.3 F (36.8 C), temperature source Oral, resp. rate 20, height 5\' 9"  (1.753 m), weight 101.152 kg (223 lb), SpO2 95.00%.  ?  IV Fluids: IV in place, occlusive dsg intact without redness, IV cath hand right, condition patent and no redness  none.  Allergies: Review of patient's allergies indicates no known allergies.  Past Medical History:  has a past medical history of Diabetes mellitus without complication; Cancer; Renal failure; Detached retina; Hypertension; Cellulitis and abscess of unspecified site (2014); ADHD (attention deficit hyperactivity disorder); Cellulitis and abscess of trunk (2014); Elevated troponin; Shortness of breath; and H/O kidney transplant.  Past Surgical History:  has past surgical history that includes Kidney transplant (12/01/2011); prostectomy (11/2010); and Retinal detachment surgery (06/2002).  Social History:  reports that he quit smoking about 21 years ago. His smoking use included Cigarettes. He has a 21 pack-year smoking history. He has never used smokeless tobacco. He reports that he drinks alcohol. He reports that he does not use illicit drugs.  Skin: intact   Patient/Family orientated to room. Information packet given to patient/family. Admission inpatient armband information verified with patient/family to include name and date of birth and placed on patient arm. Side rails up x 2, fall assessment and education completed with patient/family. Patient/family able to verbalize understanding of risk associated with falls and verbalized understanding to call for assistance before getting out of bed.  Call light within reach. Patient/family able to voice and demonstrate understanding of unit orientation instructions.  Will continue to evaluate and treat per MD orders.

## 2014-03-22 NOTE — Progress Notes (Signed)
Pt. arrived from 3S with Vision Bipap in tow, exchanged out for Bipap auto and s/u for h/s & prn use with same circuit & LG FFM, order in place for FFM/with transfer order for floor, placed M/L nasal with unit as alternate, RT to monitor, plan to pass off in report to floor RT.

## 2014-03-22 NOTE — Progress Notes (Signed)
Pt stated that he did not want to wear is bi pap any more. Pt educated that if his pox did not stay in the limits order by the doctor that we would have to put the bi pap back on. Pt agreed. Will continue to monitor.

## 2014-03-22 NOTE — Progress Notes (Signed)
Elink: 5:46 AM @ 03/22/2014   eLink Physician-Brief Progress Note Patient Name: Russell Bowers DOB: Apr 06, 1951 MRN: [id number]  Date of Service  03/22/2014   HPI/Events of Note   refused bipap. Looks stable. On 4L [place]  eICU Interventions  Monitor on [place]    communication -   Kaarin Pardy 03/22/2014, 5:46 AM

## 2014-03-22 NOTE — Progress Notes (Signed)
PULMONARY / CRITICAL CARE MEDICINE   Name: Russell Bowers MRN: [id number] DOB: 1951/12/17    ADMISSION DATE:  03/20/2014  PRIMARY SERVICE:  PCCM  CHIEF COMPLAINT:  Hypercarbic respiratory failure  BRIEF PATIENT DESCRIPTION:  63 years old male with PMH relevant for DM, post kidney transplant, CHF and recurrent pneumonias. He has severe restrictive lung disease of unclear etiology. He has chronic hypercarbic respiratory failure and now he presents with worsening hypercarbia requiring BiPAP. No evidence of acute infectious process.   SIGNIFICANT EVENTS / STUDIES:  Chest X ray: - Diminished lung volumes. Increased interstitial markings.  3/30 CT CHest > no ILD, atelectasis in bases, reactive mediastinal lymph nodes, enlarged pulmonary artery, coronary artery calcification  LINES / TUBES: - Peripheral IV's  CULTURES:   ANTIBIOTICS:    SUBJECTIVE: was frustrated with BIPAP by 0500 so removed it, says he knows he needs it  VITAL SIGNS: Temp:  [97.7 F (36.5 C)-98.5 F (36.9 C)] 98.2 F (36.8 C) (03/31 0700) Pulse Rate:  [90-140] 97 (03/31 0528) Resp:  [24-35] 27 (03/31 0528) BP: (101-120)/(51-59) 120/56 mmHg (03/31 0324) SpO2:  [64 %-97 %] 89 % (03/31 0528) Weight:  [101.5 kg (223 lb 12.3 oz)] 101.5 kg (223 lb 12.3 oz) (03/31 0443) HEMODYNAMICS:   VENTILATOR SETTINGS:   INTAKE / OUTPUT: Intake/Output     03/30 0701 - 03/31 0700 03/31 0701 - 04/01 0700   P.O. 600    Total Intake(mL/kg) 600 (5.9)    Urine (mL/kg/hr) 1525 (0.6)    Total Output 1525     Net -925          Stool Occurrence 1 x      PHYSICAL EXAMINATION: General: awake alert, comfortable in bed HEENT: NCAT PULM: crackles LLL otherwise clear CV: Regular with PAC, slight systolic murmur, no JVD AB: obese, nd, nt Ext: no edema, warm Neuro: A&Ox3, maew  LABS:  CBC  Recent Labs Lab 03/20/14 2114 03/21/14 0300 03/22/14 0320  WBC 8.9 7.1 6.5  HGB 12.2* 12.2* 11.6*  HCT 43.3 43.2 40.0  PLT  137* 150 139*   Coag's No results found for this basename: APTT, INR,  in the last 168 hours BMET  Recent Labs Lab 03/20/14 1846 03/20/14 2114 03/21/14 0300 03/22/14 0320  NA 144  --  145 145  K 4.5  --  3.8 3.6*  CL 94*  --  96 96  CO2 43*  --  43* 42*  BUN 37*  --  37* 42*  CREATININE 1.52* 1.61* 1.59* 2.04*  GLUCOSE 218*  --  45* 49*   Electrolytes  Recent Labs Lab 03/20/14 1846 03/21/14 0300 03/22/14 0320  CALCIUM 9.7 9.8 9.3   Sepsis Markers No results found for this basename: LATICACIDVEN, PROCALCITON, O2SATVEN,  in the last 168 hours ABG  Recent Labs Lab 03/20/14 1829  PHART 7.295*  PCO2ART 95.5*  PO2ART 67.0*   Liver Enzymes  Recent Labs Lab 03/20/14 1846  AST 10  ALT 8  ALKPHOS 116  BILITOT 0.4  ALBUMIN 3.7   Cardiac Enzymes  Recent Labs Lab 03/20/14 1846 03/22/14 0320  PROBNP 1788.0* 942.1*   Glucose  Recent Labs Lab 03/21/14 2012 03/22/14 0012 03/22/14 0320 03/22/14 0323 03/22/14 0636 03/22/14 0753  GLUCAP 256* 168* 57* 53* 103* 116*    Imaging   CXR:  - Diminished lung volumes. Increased interstitial markings.   ASSESSMENT / PLAN:  PULMONARY A: 1) Acute on chronic hypercarbic respiratory failure> primarily hypercarbic respiratory failure, no clear airflow  obstruction on PFT, had diaphragm weakness on 2015 fluoroscopy; obesity clearly contributing; > no ILD on 3/30 CT chest, just atelectasis > mediastinal lymphadenopathy> radiology feels "reactive" > 3/30 NIF 40, FVC 1050cc > chronic hypoxemia (currently on 2L at home) due to hypoventilation and V/Q mismatch in bases P:   - Needs home BiPAP 16/5 with outpatient BIPAP titration study - Continue home O2 - needs outpatient neurology evaluation for diaphragm weakness - will need follow up CT chest at some point to evaluate mediastinal lymphadenopathy   CARDIOVASCULAR A:  1) Congestive heart failure with preserved LVEF (60 to 65%). Perhaps slightly volume up on  admission (but weight stable from prior visits) P:  - Continue home PO lasix dose.  - Continue home metoprolol - Continue statin - Continue aspirin  RENAL A:   1) Post kidney transplant > stable renal function P:   - Continue Tacrolimus and Myfortic - Continue home lasix - Continue Sensipar - BMET in AM  GASTROINTESTINAL A:   1) GERD P:   - Continue omeprazole  ENDOCRINE A:   1) DM P:   - Novolog sliding scale.  - - Will resume home Lantus in am with diet  NEUROLOGIC A:   1) Altered metal status secondary to hypercarbia. Improved 2) Diaphragm weakness P:   -BIPAP -outpatient neurology work up  TODAY'S SUMMARY: Working with case management to get outpatient BIPAP arranged, transfer to floor   [name] PCCM Pager: [phone] Cell: [phone] If no response, call [phone]  03/22/2014, 11:13 AM

## 2014-03-22 NOTE — Progress Notes (Signed)
Inpatient Diabetes Program Recommendations  AACE/ADA: New Consensus Statement on Inpatient Glycemic Control (2013)  Target Ranges:  Prepandial:   less than 140 mg/dL      Peak postprandial:   less than 180 mg/dL (1-2 hours)      Critically ill patients:  140 - 180 mg/dL   Reason for Assessment: Hypoglycemia  Results for Russell Bowers, Russell Bowers (MRN [id number]) as of 03/22/2014 10:43  Ref. Range 03/21/2014 08:10 03/21/2014 12:41 03/21/2014 16:24 03/21/2014 20:12 03/22/2014 00:12 03/22/2014 03:20 03/22/2014 03:23 03/22/2014 06:36 03/22/2014 07:53  Glucose-Capillary Latest Range: 70-99 mg/dL 94 165 (H) 185 (H) 256 (H) 168 (H) 57 (L) 53 (L) 103 (H) 116 (H)   Appears that correction at 20:12 and 00:12 contributed to hypoglycemia around 03:20.  Patient is eating.  Request MD:  Change correction to tid  Change CBG's to ac and HS, plus either a 0200 or 0400 check if desired Thank you.  Conner Muegge S. Marcelline Mates, RN, MSN, CDE Inpatient Diabetes Program, team pager [phone]

## 2014-03-22 NOTE — Progress Notes (Signed)
Pt transferred to 5W22 via wheelchair. O2 in place. All belongings transferred. Pt w/o c/o of pain or discomfort. Report called to RN and all questions answered. Russell Bowers

## 2014-03-22 NOTE — Progress Notes (Signed)
Pt pox repeatedly drop below 85 and in the 70's. Pt was informed of this and refused to wear bi pap. "I'm not putting that machine back on." o2 increased to 4L.  Md paged. Will continue to monitor pt.

## 2014-03-22 NOTE — Progress Notes (Signed)
Placed patient on Auto-BIPAP with minimum pressure set at 6cm and maximum pressure set at 20cm. Oxygen set at 4

## 2014-03-22 NOTE — Progress Notes (Signed)
NIF/FVC obtained @ this time: FVC-(1L), NIF-(-40), sitting up on side of bed, good effort.

## 2014-03-22 NOTE — Discharge Summary (Addendum)
Physician Discharge Summary       Patient ID: Russell Bowers MRN: [id number] DOB/AGE: Feb 03, 1951 63 y.o.  Admit date: 03/20/2014 Discharge date: 03/23/2014  Discharge Diagnoses:   Detailed Hospital Course:   63 years old male with PMH relevant for DM, post kidney transplant, CHF and recurrent pneumonias. He has severe restrictive lung disease of unclear etiology. He has chronic hypercarbic respiratory failure and now he present 3/29 with worsening hypercarbia requiring BiPAP. No evidence of acute infectious process. He was admitted to the step-down unit. Therapeutic and diagnostic interventions were as follows: Placed on NIPPV. CT chest 3/30: no ILD, basilar atelectasis noted, reactive mediastinal LNs. Sniff test: showed low lung volumes, however had limited but definite movement of both hemidiaphragms with breathing and sniffing. His NIF was 40 and FVC was 1050 cc. Ultimately had no clear airflow obstruction on PFT, had diaphragm weakness on 2015 fluoroscopy; obesity clearly contributing but felt that he would need out-patient neurology consultation to help assist with explanation of his diaphragm weakness.  He improved with support of NIPPV at night. At time of discharge his plan of care will be as outlined below.    Discharge Plan by diagnoses   1) Acute on chronic hypercarbic respiratory failure:  primarily hypercarbic respiratory failure, no clear airflow obstruction on PFT, had diaphragm weakness on 2015 fluoroscopy; obesity clearly contributing;  2) mediastinal lymphadenopathy> radiology feels "reactive"  Plan:  - Needs home BiPAP 16/5 with outpatient BIPAP titration study  - Continue home O2  - needs outpatient neurology evaluation for diaphragm weakness  - will need follow up CT chest at some point to evaluate mediastinal lymphadenopathy   3) Congestive heart failure with preserved LVEF (60 to 65%). Perhaps slightly volume up on admission (but weight stable from prior visits)    Plan:  - Continue home PO lasix dose.  - Continue home metoprolol  - Continue statin  - Continue aspirin   4) Post kidney transplant > stable renal function  Plan:  - Continue Tacrolimus and Myfortic  - Continue home lasix  - Continue Sensipar  - BMET 4/2 with [facility] - F/U with [facility] as previously scheduled on 4/14  5) GERD  P:  - Continue omeprazole   6) DM  P:  - Novolog sliding scale.  - - Will resume home Lantus in am with diet   7) Diaphragm weakness  P:  -BIPAP  -outpatient neurology work up   [facility] tests/ studies/ interventions and procedures  3/30 CT Chest > no ILD, atelectasis in bases, reactive mediastinal lymph nodes, enlarged pulmonary artery, coronary artery calcification   LINES / TUBES:  - Peripheral IV's  CULTURES:   ANTIBIOTICS:   Discharge Exam: BP 110/70  Pulse 93  Temp(Src) 97.7 F (36.5 C) (Tympanic)  Resp 16  Ht 5\' 9"  (1.753 m)  Wt 101.5 kg (223 lb 12.3 oz)  BMI 33.03 kg/m2  SpO2 93%  General: awake alert, comfortable in bed  HEENT: NCAT  PULM: crackles LLL otherwise clear  CV: Regular with PAC, slight systolic murmur, no JVD  AB: obese, nd, nt  Ext: no edema, warm  Neuro: A&Ox3, maew   Labs at discharge Lab Results  Component Value Date   CREATININE 2.10* 03/23/2014   BUN 43* 03/23/2014   NA 146 03/23/2014   K 4.4 03/23/2014   CL 96 03/23/2014   CO2 43* 03/23/2014   Lab Results  Component Value Date   WBC 6.5 03/22/2014   HGB 11.6*  03/22/2014   HCT 40.0 03/22/2014   MCV 97.8 03/22/2014   PLT 139* 03/22/2014   Lab Results  Component Value Date   ALT 8 03/20/2014   AST 10 03/20/2014   ALKPHOS 116 03/20/2014   BILITOT 0.4 03/20/2014   Lab Results  Component Value Date   INR 1.23 04/12/2013   INR 1.16 04/11/2013   INR 1.01 03/11/2013    Current radiology studies Ct Chest High Resolution  03/21/2014   CLINICAL DATA:  Status post renal transplant.  Recurrent pneumonia.  EXAM: CHEST CT WITHOUT  CONTRAST  TECHNIQUE: Multidetector CT imaging of the chest was performed following the standard protocol without intravenous contrast. High resolution imaging of the lungs, as well as inspiratory and expiratory imaging, was performed.  COMPARISON:  DG CHEST 2 VIEW dated 03/20/2014; DG CHEST 1V PORT dated 04/20/2013; DG CHEST 1V PORT dated 04/12/2013; DG CHEST 2 VIEW dated 04/05/2013  FINDINGS: Visualized thyroid is unremarkable. No enlarged axillary, mediastinal or hilar lymphadenopathy. Ascending thoracic aorta is dilated measuring 3.9 cm. Main pulmonary artery is enlarged measuring 3.6 cm. Cardiomegaly. Coronary arterial calcifications. Mitral annular calcifications. No large pericardial effusion. Small amount of fluid in the superior pericardial recess. Esophagus is grossly unremarkable.  Central airways are patent. Bandlike consolidative opacities are demonstrated within the lingula, bilateral lower lobes and right middle lobe. No pleural effusion or pneumothorax. No significant air trapping demonstrated on expiratory images. No significant subpleural fibrotic change demonstrated.  Limited visualization of the upper abdomen demonstrates cholelithiasis. No aggressive or acute appearing osseous lesions. Mid thoracic spine degenerative change.  IMPRESSION: 1. Bandlike consolidative opacities within the bilateral lungs are favored to be secondary to atelectasis. Infection is not excluded. Continued radiographic followup to ensure resolution is recommended. 2. No evidence for subpleural reticulation or fibrosis on high-resolution images. 3. Multiple prominent mediastinal lymph nodes, likely reactive in etiology. 4. Enlargement of the ascending thoracic aorta measuring 3.9 cm. 5. Enlargement of the main pulmonary artery as can be seen with pulmonary arterial hypertension. 6. Coronary arterial vascular calcifications. 7. Cholelithiasis.   Electronically Signed   By: Lovey Newcomer M.D.   On: 03/21/2014 18:34     Disposition:  01-Home or Self Care       Future Appointments Provider Department Dept Phone   03/29/2014 8:00 PM [facility] [place] [phone]   04/26/2014 10:00 AM [name], MD [facility] [phone]       Medication List    STOP taking these medications       diphenhydrAMINE 25 MG tablet  Commonly known as:  BENADRYL     insulin glargine 100 UNIT/ML injection  Commonly known as:  LANTUS      TAKE these medications       allopurinol 300 MG tablet  Commonly known as:  ZYLOPRIM  Take 300 mg by mouth daily.     aspirin EC 81 MG tablet  Take 81 mg by mouth daily.     B-D ULTRAFINE III SHORT PEN 31G X 8 MM Misc  Generic drug:  Insulin Pen Needle     calcitRIOL 0.25 MCG capsule  Commonly known as:  ROCALTROL  Take 0.25 mcg by mouth daily.     cetirizine 10 MG tablet  Commonly known as:  ZYRTEC  Take 10 mg by mouth daily.     cinacalcet 30 MG tablet  Commonly known as:  SENSIPAR  Take 30 mg by mouth daily.     furosemide 80  MG tablet  Commonly known as:  LASIX  Take 2 tablets (160 mg total) by mouth 3 (three) times daily.     glipiZIDE 10 MG 24 hr tablet  Commonly known as:  GLUCOTROL XL  Take 10 mg by mouth 2 (two) times daily.     insulin regular 100 units/mL injection  Commonly known as:  NOVOLIN R,HUMULIN R  Inject 0-8 Units into the skin 3 (three) times daily as needed for high blood sugar (per sliding scale).     metoprolol 50 MG tablet  Commonly known as:  LOPRESSOR  Take 25 mg by mouth 2 (two) times daily.     mycophenolate 180 MG EC tablet  Commonly known as:  MYFORTIC  Take 360 mg by mouth 2 (two) times daily.     omeprazole 20 MG capsule  Commonly known as:  PRILOSEC  Take 20 mg by mouth daily.     pravastatin 80 MG tablet  Commonly known as:  PRAVACHOL  Take 80 mg by mouth at bedtime.     sodium chloride 0.65 % nasal spray  Commonly known as:  OCEAN  Place 3-4 sprays into the nose  at bedtime.     tacrolimus 1 MG capsule  Commonly known as:  PROGRAF  Take 2-3 mg by mouth 2 (two) times daily. Take 3 capsules (3 mg) every morning and 2 capsules (2 mg) every night       Follow-up Information   Follow up with Gennette Pac, MD On 04/07/2014. (3:15pm )    Specialty:  Family Medicine   Contact information:   [place] [address] [phone]       Discharged Condition: stable  Physician Statement:   The Patient was personally examined, the discharge assessment and plan has been personally reviewed and I agree with ACNP Babcock's assessment and plan. > 30 minutes of time have been dedicated to discharge assessment, planning and discharge instructions.   SignedDarlina Sicilian, NP 03/23/2014, 11:13 AM  Attending:  I have seen and examined the patient with nurse practitioner/resident and agree with the note above.   [name] PCCM Pager: [phone] If no response, call [phone]

## 2014-03-23 DIAGNOSIS — I5032 Chronic diastolic (congestive) heart failure: Secondary | ICD-10-CM | POA: Diagnosis not present

## 2014-03-23 DIAGNOSIS — J9610 Chronic respiratory failure, unspecified whether with hypoxia or hypercapnia: Secondary | ICD-10-CM

## 2014-03-23 DIAGNOSIS — E119 Type 2 diabetes mellitus without complications: Secondary | ICD-10-CM | POA: Diagnosis not present

## 2014-03-23 DIAGNOSIS — J9620 Acute and chronic respiratory failure, unspecified whether with hypoxia or hypercapnia: Secondary | ICD-10-CM | POA: Diagnosis not present

## 2014-03-23 LAB — BASIC METABOLIC PANEL
BUN: 43 mg/dL — ABNORMAL HIGH (ref 6–23)
CHLORIDE: 96 meq/L (ref 96–112)
CO2: 43 meq/L — AB (ref 19–32)
CREATININE: 2.1 mg/dL — AB (ref 0.50–1.35)
Calcium: 9.7 mg/dL (ref 8.4–10.5)
GFR calc Af Amer: 37 mL/min — ABNORMAL LOW (ref >90)
GFR calc non Af Amer: 32 mL/min — ABNORMAL LOW (ref >90)
Glucose, Bld: 185 mg/dL — ABNORMAL HIGH (ref 70–99)
Potassium: 4.4 mEq/L (ref 3.7–5.3)
Sodium: 146 mEq/L (ref 137–147)

## 2014-03-23 LAB — GLUCOSE, CAPILLARY: Glucose-Capillary: 160 mg/dL — ABNORMAL HIGH (ref 70–99)

## 2014-03-23 MED ORDER — CINACALCET HCL 30 MG PO TABS
30.0000 mg | ORAL_TABLET | Freq: Every day | ORAL | Status: DC
  Administered 2014-03-23: 30 mg via ORAL
  Filled 2014-03-23 (×2): qty 1

## 2014-03-23 NOTE — Progress Notes (Signed)
NIF/FVC obtained per Q12 order: NIF(-46)cmh20/FVC(1.36)L, great effort, tolerated well.

## 2014-03-23 NOTE — Progress Notes (Signed)
NIF and VC performed at this time. NIF -35. VC 1L. Patient had good effort

## 2014-03-23 NOTE — Progress Notes (Signed)
CRITICAL VALUE ALERT  Critical value received:  co2 43   Date of notification:  03/23/14  Time of notification:  0810  Critical value read back:yes  Nurse who received alert:  Jake Fuhrmann   MD notified (1st page):  Dr. Marlowe Sax ( stated I needed to call Elsworth Soho or Melvyn Novas, on hold for Marlowe Sax 15 min)  Time of first page:  0810  MD notified (2nd page): Dr. Melvyn Novas  Time of second page: 0830  Responding MD:  Dr. Melvyn Novas  Time MD responded:  [phone]

## 2014-03-23 NOTE — Progress Notes (Signed)
Nsg Discharge Note  Admit Date:  03/20/2014 Discharge date: 03/23/2014   Russell Bowers to be D/C'd Home per MD order.  AVS completed.  Copy for chart, and copy for patient signed, and dated. Patient/caregiver able to verbalize understanding.  Discharge Medication:   Medication List    STOP taking these medications       diphenhydrAMINE 25 MG tablet  Commonly known as:  BENADRYL     insulin glargine 100 UNIT/ML injection  Commonly known as:  LANTUS      TAKE these medications       allopurinol 300 MG tablet  Commonly known as:  ZYLOPRIM  Take 300 mg by mouth daily.     aspirin EC 81 MG tablet  Take 81 mg by mouth daily.     B-D ULTRAFINE III SHORT PEN 31G X 8 MM Misc  Generic drug:  Insulin Pen Needle     calcitRIOL 0.25 MCG capsule  Commonly known as:  ROCALTROL  Take 0.25 mcg by mouth daily.     cetirizine 10 MG tablet  Commonly known as:  ZYRTEC  Take 10 mg by mouth daily.     cinacalcet 30 MG tablet  Commonly known as:  SENSIPAR  Take 30 mg by mouth daily.     furosemide 80 MG tablet  Commonly known as:  LASIX  Take 2 tablets (160 mg total) by mouth 3 (three) times daily.     glipiZIDE 10 MG 24 hr tablet  Commonly known as:  GLUCOTROL XL  Take 10 mg by mouth 2 (two) times daily.     insulin regular 100 units/mL injection  Commonly known as:  NOVOLIN R,HUMULIN R  Inject 0-8 Units into the skin 3 (three) times daily as needed for high blood sugar (per sliding scale).     metoprolol 50 MG tablet  Commonly known as:  LOPRESSOR  Take 25 mg by mouth 2 (two) times daily.     mycophenolate 180 MG EC tablet  Commonly known as:  MYFORTIC  Take 360 mg by mouth 2 (two) times daily.     omeprazole 20 MG capsule  Commonly known as:  PRILOSEC  Take 20 mg by mouth daily.     pravastatin 80 MG tablet  Commonly known as:  PRAVACHOL  Take 80 mg by mouth at bedtime.     sodium chloride 0.65 % nasal spray  Commonly known as:  OCEAN  Place 3-4 sprays into the  nose at bedtime.     tacrolimus 1 MG capsule  Commonly known as:  PROGRAF  Take 2-3 mg by mouth 2 (two) times daily. Take 3 capsules (3 mg) every morning and 2 capsules (2 mg) every night        Discharge Assessment: Filed Vitals:   03/23/14 1003  BP: 110/70  Pulse:   Temp:   Resp:    Skin clean, dry and intact without evidence of skin break down, no evidence of skin tears noted. IV catheter discontinued intact. Site without signs and symptoms of complications - no redness or edema noted at insertion site, patient denies c/o pain - only slight tenderness at site.  Dressing with slight pressure applied.  D/c Instructions-Education: Discharge instructions given to patient/family with verbalized understanding. D/c education completed with patient/family including follow up instructions, medication list, d/c activities limitations if indicated, with other d/c instructions as indicated by MD - patient able to verbalize understanding, all questions fully answered. Patient instructed to return to ED, call 911,  or call MD for any changes in condition.  Patient escorted via [place], and D/C home via private auto.  Orval Dortch Margaretha Sheffield, RN 03/23/2014 1:07 PM

## 2014-03-24 ENCOUNTER — Telehealth: Payer: Self-pay | Admitting: *Deleted

## 2014-03-24 DIAGNOSIS — J9610 Chronic respiratory failure, unspecified whether with hypoxia or hypercapnia: Secondary | ICD-10-CM

## 2014-03-24 DIAGNOSIS — J449 Chronic obstructive pulmonary disease, unspecified: Secondary | ICD-10-CM | POA: Diagnosis not present

## 2014-03-24 DIAGNOSIS — G4733 Obstructive sleep apnea (adult) (pediatric): Secondary | ICD-10-CM | POA: Diagnosis not present

## 2014-03-24 NOTE — Telephone Encounter (Signed)
Pt has already been scheduled to see RA - 04/26/14 at [facility] will be placed for pulmonary rehab.

## 2014-03-24 NOTE — Telephone Encounter (Signed)
Message copied by Horatio Pel on Thu Mar 24, 2014 10:58 AM ------      Message from: Simonne Maffucci B      Created: Wed Mar 23, 2014 12:08 PM       Hi All,            Can we order pulmonary rehab for chronic respiratory failure for Mr. Nydam?              Also needs hospital f/u with Dr. Elsworth Soho in 2-4 weeks            Thanks      B ------

## 2014-03-25 DIAGNOSIS — Z940 Kidney transplant status: Secondary | ICD-10-CM | POA: Diagnosis not present

## 2014-03-29 ENCOUNTER — Ambulatory Visit (HOSPITAL_BASED_OUTPATIENT_CLINIC_OR_DEPARTMENT_OTHER): Payer: Medicare Other | Attending: Pulmonary Disease | Admitting: Radiology

## 2014-03-29 VITALS — Ht 69.0 in | Wt 220.0 lb

## 2014-03-29 DIAGNOSIS — G4733 Obstructive sleep apnea (adult) (pediatric): Secondary | ICD-10-CM | POA: Diagnosis not present

## 2014-03-30 ENCOUNTER — Encounter: Payer: Self-pay | Admitting: Pulmonary Disease

## 2014-04-01 ENCOUNTER — Telehealth: Payer: Self-pay | Admitting: Pulmonary Disease

## 2014-04-01 DIAGNOSIS — G4710 Hypersomnia, unspecified: Secondary | ICD-10-CM | POA: Diagnosis not present

## 2014-04-01 DIAGNOSIS — G4733 Obstructive sleep apnea (adult) (pediatric): Secondary | ICD-10-CM

## 2014-04-01 DIAGNOSIS — G4730 Sleep apnea, unspecified: Secondary | ICD-10-CM

## 2014-04-01 NOTE — Sleep Study (Addendum)
[place]  NAME: Russell Bowers  DATE OF BIRTH: [birth year]  MEDICAL RECORD NUMBER [id number]  LOCATION: [facility]  PHYSICIAN: Cj Beecher V.  DATE OF STUDY: 03/29/2014   SLEEP STUDY TYPE:BiPAP titration study               REFERRING PHYSICIAN: [name], MD  INDICATION FOR STUDY: 63 year old ex-smoker, renal transplant recipient with diaphragmatic paralysis and hypercarbic respiratory failure.Polysomnogram at [facility] in 02/2012 showed moderate obstructive sleep apnea with AHI of 25 events per hour, lowest desaturation of 71%. Unfortunately he did not tolerate CPAP on a subsequent titration study.   At the time of this study ,they weighed 218 pounds with a height of  5 ft 8 inches and the BMI of 33, neck size of 14.5 inches. Epworth sleepiness score was 7   This BiPAP titration polysomnogram was performed with a sleep technologist in attendance. EEG, EOG,EMG and respiratory parameters recorded. Sleep stages, arousals, limb movements and respiratory data was scored according to criteria laid out by the American Academy of sleep medicine.  SLEEP ARCHITECTURE: Lights out was at 2252 PM and lights on was at 519 AM. Total sleep time was 291 minutes with sleep period time of 342 minutes and sleep efficiency of 75%.Sleep latency was 43 minutes with latency to REM sleep of 4 minutes and wake after sleep onset of 51 minutes.  Sleep stages as a percentage of total sleep time was N1 -2.6 %,N2- 60 % and REM sleep 35 % ( 103 minutes) . The longest period of REM sleep was around 4 AM.   AROUSAL DATA : There were 34 arousals with an arousal index of 7 events per hour. Of these 26 were spontaneous, and 4 were associated with respiratory events and 4 were associated periodic limb movements  RESPIRATORY DATA: BiPAP was initiated at 8/4 centimeters and titrated to a final level of 13/9 centimeters due to respiratory events and snoring. At the final level of  13/9 centimeters for 164 mins, there were 0 obstructive apneas, 0 central apneas, 0 mixed apneas and 0 hypopneas with apnea -hypopnea index of 0 events per hour.  There was no relation to sleep stage or body position. Titration was optimal.  MOVEMENT/PARASOMNIA: There were 213 PLMS with a PLM index of 44 events per hour. The PLM arousal index was 0.8 events per hour.  OXYGEN DATA: Note that the study was performed with 2 L oxygen blended in .The lowest desaturation was 77 % during non-REM sleep and the desaturation index was 6 per hour. The saturations stayed below 88% for 17 minutes.  CARDIAC DATA: The low heart rate was 35 beats per minute. The high heart rate recorded was an artifact. No arrhythmias were noted   IMPRESSION :  1. moderate obstructive sleep apnea with hypopneas causing sleep fragmentation and mild oxygen desaturation. 2. This was corrected by BiPAP of 13/9 centimeters with a medium fullface mask. Titration was optimal. 3. No evidence of cardiac arrhythmias or behavioral disturbance during sleep. 4. Periodic limb movements were not noted.  RECOMMENDATION:    1. The treatment options for this degree of sleep disordered breathing includes BiPAP therapy and oxygen. BiPAP can be initiated at 13/9 centimeters with a medium fullface mask with 2  liters of oxygen blended and compliance monitored at this level. 2. Patient should be cautioned against driving when sleepy 3. They should be asked to avoid medications with sedative side effects  Kanyah Matsushima V.  MD Diplomate, American Board of Sleep Medicine  ELECTRONICALLY SIGNED ON:  [place] PH: [phone]   FX: [phone] [place]

## 2014-04-01 NOTE — Telephone Encounter (Signed)
I spoke with patient about results and he verbalized understanding and had no questions 

## 2014-04-01 NOTE — Telephone Encounter (Signed)
He slept well with BiPAP and oxygen BiPAP can be changed to 13/9 centimeters with a medium fullface mask with 2 liters of oxygen blended  Download and followup in one month

## 2014-04-05 DIAGNOSIS — E118 Type 2 diabetes mellitus with unspecified complications: Secondary | ICD-10-CM | POA: Diagnosis not present

## 2014-04-05 DIAGNOSIS — Z940 Kidney transplant status: Secondary | ICD-10-CM | POA: Diagnosis not present

## 2014-04-05 DIAGNOSIS — N183 Chronic kidney disease, stage 3 unspecified: Secondary | ICD-10-CM | POA: Diagnosis not present

## 2014-04-05 DIAGNOSIS — M109 Gout, unspecified: Secondary | ICD-10-CM | POA: Diagnosis not present

## 2014-04-05 DIAGNOSIS — N2581 Secondary hyperparathyroidism of renal origin: Secondary | ICD-10-CM | POA: Diagnosis not present

## 2014-04-05 DIAGNOSIS — I129 Hypertensive chronic kidney disease with stage 1 through stage 4 chronic kidney disease, or unspecified chronic kidney disease: Secondary | ICD-10-CM | POA: Diagnosis not present

## 2014-04-13 ENCOUNTER — Encounter: Payer: Self-pay | Admitting: Internal Medicine

## 2014-04-13 DIAGNOSIS — N390 Urinary tract infection, site not specified: Secondary | ICD-10-CM | POA: Diagnosis not present

## 2014-04-15 ENCOUNTER — Telehealth (HOSPITAL_COMMUNITY): Payer: Self-pay

## 2014-04-15 ENCOUNTER — Institutional Professional Consult (permissible substitution): Payer: Medicare Other | Admitting: Pulmonary Disease

## 2014-04-15 NOTE — Telephone Encounter (Signed)
Contacted patient to inquire about attending Pulmonary Rehab.  Patient states that he is not interested at this time and will contact us at a later date if he changes his mind.

## 2014-04-20 DIAGNOSIS — J9610 Chronic respiratory failure, unspecified whether with hypoxia or hypercapnia: Secondary | ICD-10-CM | POA: Diagnosis not present

## 2014-04-20 DIAGNOSIS — E1129 Type 2 diabetes mellitus with other diabetic kidney complication: Secondary | ICD-10-CM | POA: Diagnosis not present

## 2014-04-26 ENCOUNTER — Ambulatory Visit: Payer: Medicare Other | Admitting: Pulmonary Disease

## 2014-04-27 DIAGNOSIS — E1139 Type 2 diabetes mellitus with other diabetic ophthalmic complication: Secondary | ICD-10-CM | POA: Diagnosis not present

## 2014-04-27 DIAGNOSIS — H2510 Age-related nuclear cataract, unspecified eye: Secondary | ICD-10-CM | POA: Diagnosis not present

## 2014-04-27 DIAGNOSIS — E11329 Type 2 diabetes mellitus with mild nonproliferative diabetic retinopathy without macular edema: Secondary | ICD-10-CM | POA: Diagnosis not present

## 2014-04-27 DIAGNOSIS — H43819 Vitreous degeneration, unspecified eye: Secondary | ICD-10-CM | POA: Diagnosis not present

## 2014-04-29 ENCOUNTER — Ambulatory Visit: Payer: Medicare Other | Admitting: Neurology

## 2014-05-01 IMAGING — RF DG SNIFF TEST
4 series · 4 of 4 positions shown · non-contrast
Comparison: Multiple prior chest x-rays.

CLINICAL DATA: Respiratory difficulty.

EXAM:
SNIFF TEST
TECHNIQUE: Fluoroscopy was used to evaluate for are diaphragmatic motion.
FLUOROSCOPY TIME:  0 min and 47 seconds

[Series 1: run · 1 of 1 slices shown (1 of 4)]
[im 1/1]
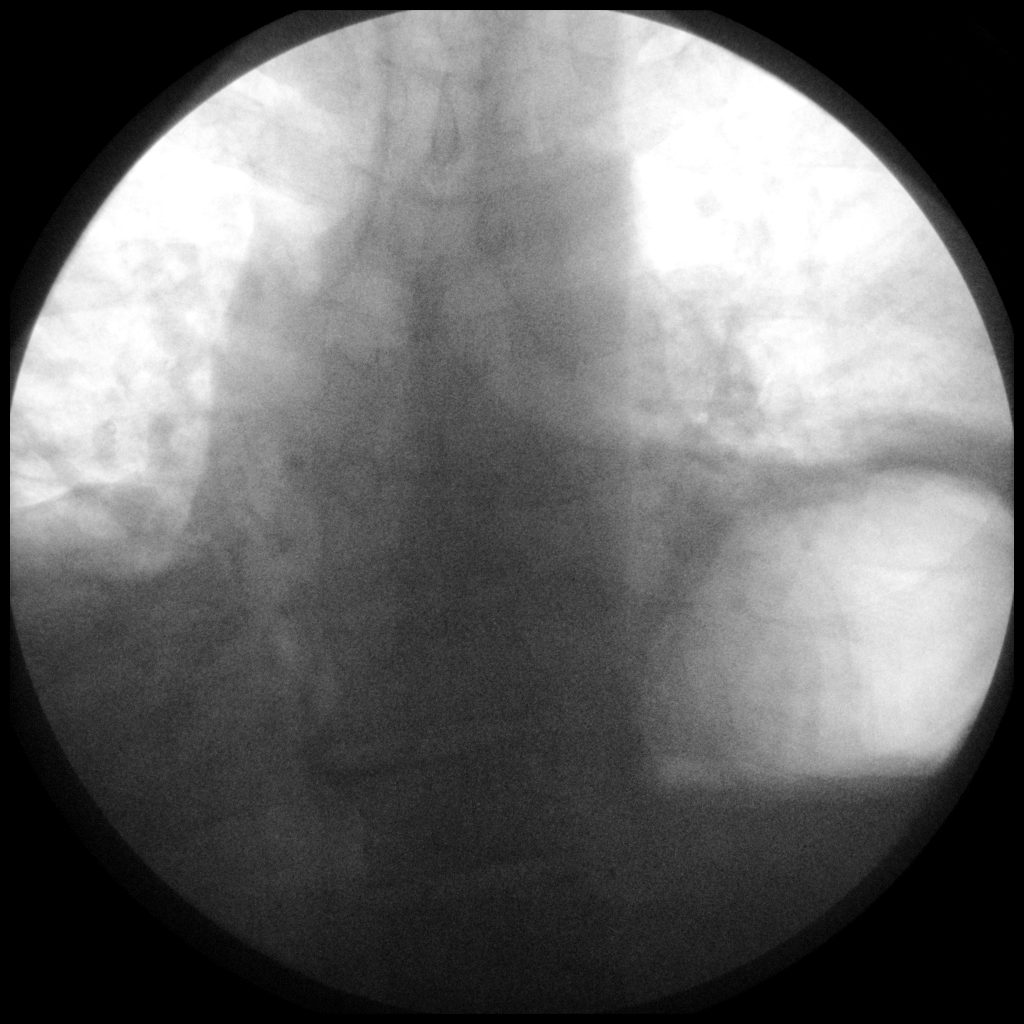

[Series 2: run · 1 of 1 slices shown (2 of 4)]
[im 1/1]
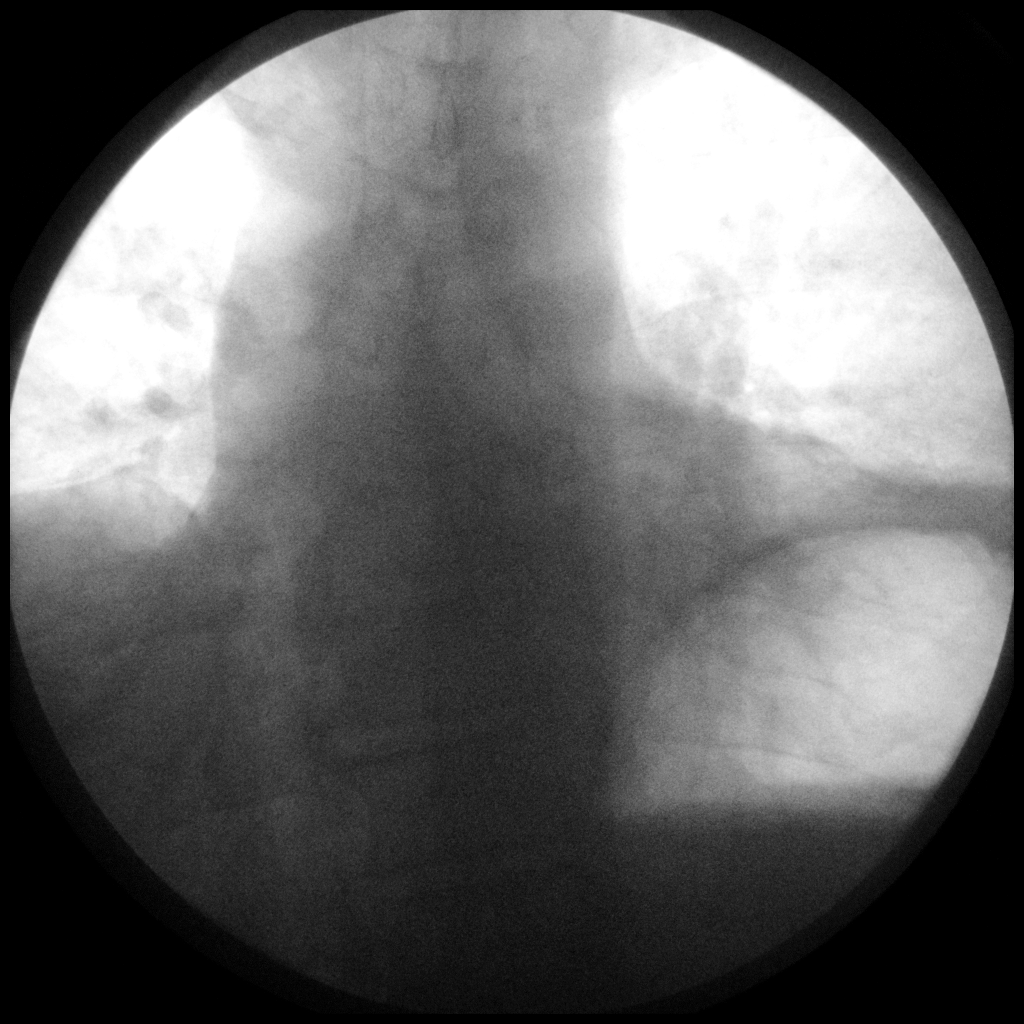

[Series 3: run · 1 of 1 slices shown (3 of 4)]
[im 1/1]
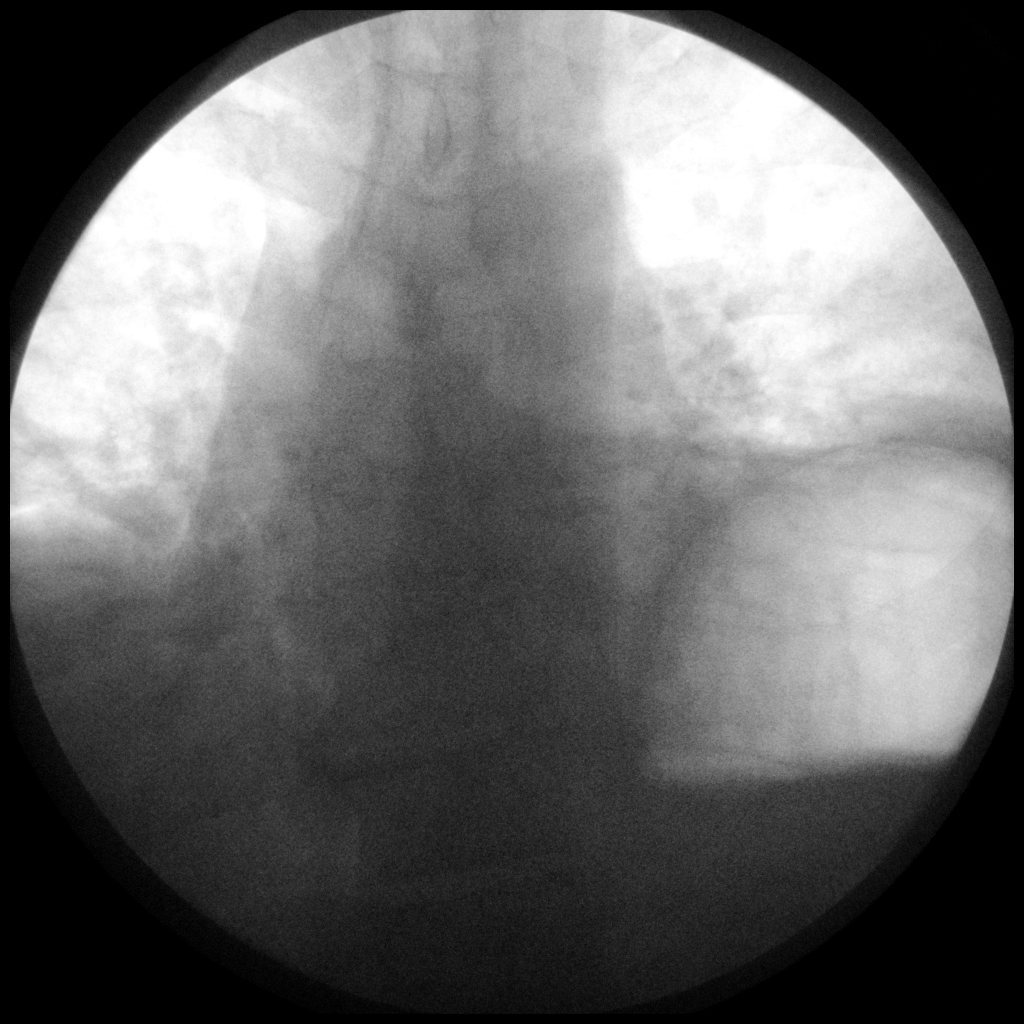

[Series 4: run · 1 of 1 slices shown (4 of 4)]
[im 1/1]
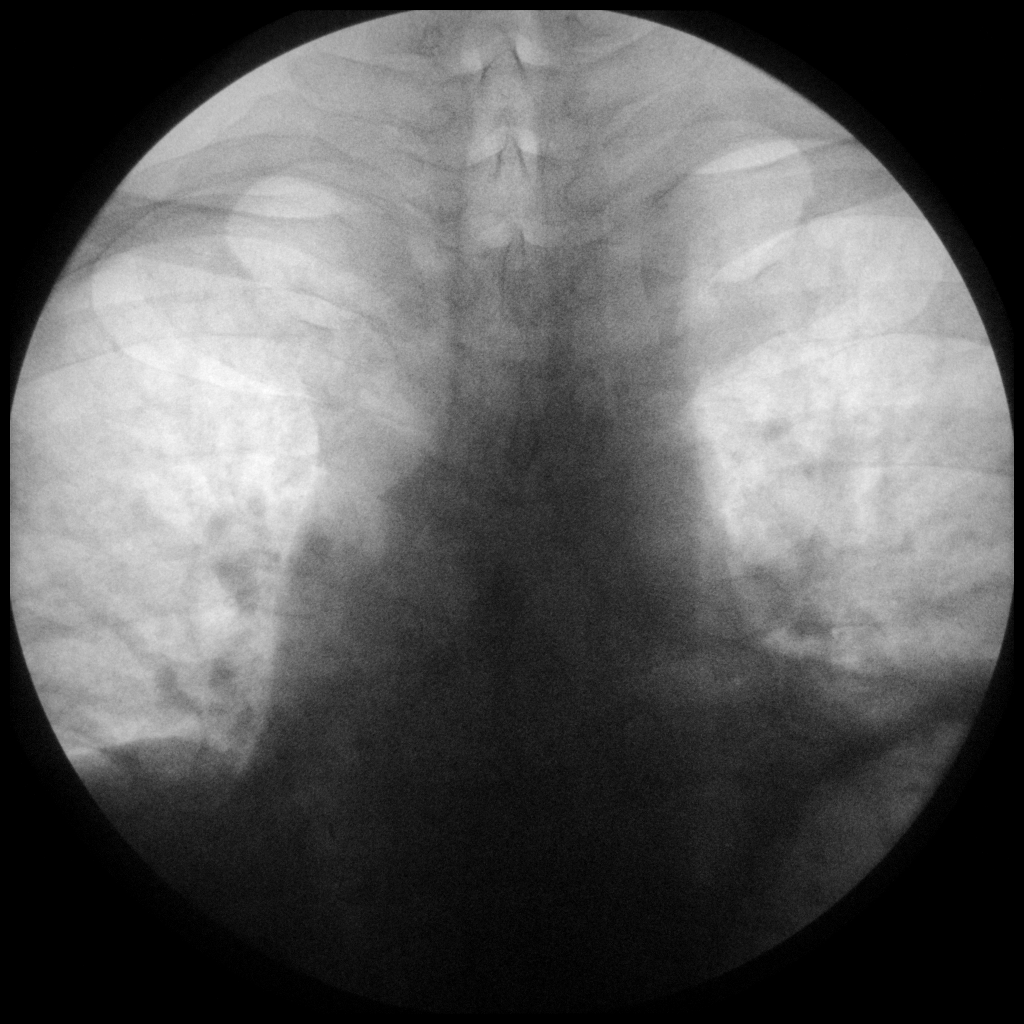

[4 of 4 positions shown; findings below may reference images not displayed]

FINDINGS: Very low lung volumes are noted with moderate vascular congestion.
There is mild elevation of the left hemidiaphragm in relation to the
right. Both hemidiaphragms do move with deep inspiration and
sniffing maneuver but it is fairly limited.
IMPRESSION: Low lung volumes.

Limited but definite movement of both hemidiaphragms with breathing
and sniffing.

## 2014-05-06 ENCOUNTER — Telehealth: Payer: Self-pay | Admitting: Pulmonary Disease

## 2014-05-06 DIAGNOSIS — G4733 Obstructive sleep apnea (adult) (pediatric): Secondary | ICD-10-CM

## 2014-05-06 NOTE — Telephone Encounter (Signed)
Add biflex settings if available If not, then change to autobipap -IPAP 10-15, EPAP 5-10

## 2014-05-06 NOTE — Telephone Encounter (Signed)
LMOM for pt x 1

## 2014-05-06 NOTE — Telephone Encounter (Signed)
Called spoke w/ pt. Aware will get referral placed. Nothing further needed

## 2014-05-06 NOTE — Telephone Encounter (Signed)
Pt returned call & can be reached at [phone].  Russell Bowers

## 2014-05-06 NOTE — Telephone Encounter (Signed)
[name], MD at 04/01/2014 2:31 PM     Status: Signed        He slept well with BiPAP and oxygen  BiPAP can be changed to 13/9 centimeters with a medium fullface mask with 2 liters of oxygen blended  Download and followup in one month  --  Called spoke with pt. He feels like the pressure is too much. He wants to lower his pressure. He is getting up a lot in middle of night/adjusting mask. His stamina has improved and bipap has helped a lot. Please advise RA thanks  [facility]

## 2014-05-11 ENCOUNTER — Ambulatory Visit: Payer: Medicare Other | Admitting: Pulmonary Disease

## 2014-05-11 DIAGNOSIS — N2581 Secondary hyperparathyroidism of renal origin: Secondary | ICD-10-CM | POA: Diagnosis not present

## 2014-05-11 DIAGNOSIS — Z940 Kidney transplant status: Secondary | ICD-10-CM | POA: Diagnosis not present

## 2014-05-13 ENCOUNTER — Encounter: Payer: Self-pay | Admitting: Pulmonary Disease

## 2014-05-23 ENCOUNTER — Encounter: Payer: Self-pay | Admitting: Pulmonary Disease

## 2014-05-23 ENCOUNTER — Ambulatory Visit (INDEPENDENT_AMBULATORY_CARE_PROVIDER_SITE_OTHER): Payer: Medicare Other | Admitting: Pulmonary Disease

## 2014-05-23 VITALS — BP 122/64 | HR 88 | Temp 98.4°F | Ht 68.0 in | Wt 221.6 lb

## 2014-05-23 DIAGNOSIS — G4733 Obstructive sleep apnea (adult) (pediatric): Secondary | ICD-10-CM

## 2014-05-23 DIAGNOSIS — J9610 Chronic respiratory failure, unspecified whether with hypoxia or hypercapnia: Secondary | ICD-10-CM | POA: Diagnosis not present

## 2014-05-23 NOTE — Patient Instructions (Signed)
We will check your oxygen levels on bipap during sleep & advise You are on auto bipap settings  We discussed supplies

## 2014-05-23 NOTE — Progress Notes (Signed)
   Subjective:    Patient ID: Russell Bowers, male    DOB: 31-Aug-1951, 63 y.o.   MRN: [id number]  HPI  63 year old ex-smoker, renal transplant recipient with diaphragmatic paralysis and hypercarbic respiratory failure.Polysomnogram at [facility] in 02/2012 showed moderate obstructive sleep apnea with AHI of 25 events per hour, lowest desaturation of 71%. Unfortunately he did not tolerate CPAP on a subsequent titration study.   He quit smoking 1994 (20-pack-year) He was admitted 03/2013 with acute hypercarbic resp failure in setting H Flu HCAP (similar recent admit February at [facility]) and likely decompensated OSA. Required mechanical ventilation Course c/b acute on chronic renal insufficiency, poor glycemic control and back abscess with hx peptostreptococcus.  PFTs showed severe restriction with a ratio 77, FVC 1.35-32%, FEV1 1.04-32% and TLC of 3.0 L-46% with DLCO at 52% that corrects for alveolar volumes suggestive of extra parenchymal restriction. Sniff test showed minimal mobility of diaphragms.  Polysomnogram at [facility] in 02/2012 showed moderate obstructive sleep apnea with AHI of 25 events per hour, lowest desaturation of 71%. Unfortunately he did not tolerate CPAP on a subsequent titration study.  Nocturnal oximetry showed severe desaturation on 2 L of oxygen with a pattern suggestive hypoventilation, he spent 393 minutes with sats less than 88%.     At the time of this study ,they weighed 218 pounds with a height of 5 ft 8 inches and the BMI of 33, neck size of 14.5 inches. Epworth sleepiness score was 7   He slept well with BiPAP at 13/9 centimeters with a medium fullface mask with 2 liters of oxygen blended   04/2014 He feels like the pressure is too much. He wants to lower his pressure. He is getting up a lot in middle of night/adjusting mask. His stamina has improved and bipap has helped a lot >> biflex vs autobipap  Downloads reviewed on bipap 13/9 & auto >> no residuals, no leak,  good usage  Feels much improved, no sleepiness, more energy, O2 levels better, uses O2 every other night Desatn transiently to 85% on walking & recovers on resting  Review of Systems neg for any significant sore throat, dysphagia, itching, sneezing, nasal congestion or excess/ purulent secretions, fever, chills, sweats, unintended wt loss, pleuritic or exertional cp, hempoptysis, orthopnea pnd or change in chronic leg swelling. Also denies presyncope, palpitations, heartburn, abdominal pain, nausea, vomiting, diarrhea or change in bowel or urinary habits, dysuria,hematuria, rash, arthralgias, visual complaints, headache, numbness weakness or ataxia.     Objective:   Physical Exam  Gen. Pleasant, well-nourished, in no distress, normal affect ENT - no lesions, no post nasal drip Neck: No JVD, no thyromegaly, no carotid bruits Lungs: no use of accessory muscles, no dullness to percussion, clear without rales or rhonchi  Cardiovascular: Rhythm regular, heart sounds  normal, no murmurs or gallops, no peripheral edema Abdomen: soft and non-tender, no hepatosplenomegaly, BS normal. Musculoskeletal: No deformities, no cyanosis or clubbing Neuro:  alert, non focal        Assessment & Plan:

## 2014-05-23 NOTE — Assessment & Plan Note (Signed)
You are on auto bipap settings  We discussed supplies  Weight loss encouraged, compliance with goal of at least 4-6 hrs every night is the expectation. Advised against medications with sedative side effects Cautioned against driving when sleepy - understanding that sleepiness will vary on a day to day basis

## 2014-05-23 NOTE — Assessment & Plan Note (Signed)
Check satn on exertion - ok We will check your oxygen levels on bipap during sleep & advise, likely can dc O2

## 2014-05-25 DIAGNOSIS — Z940 Kidney transplant status: Secondary | ICD-10-CM | POA: Diagnosis not present

## 2014-06-01 ENCOUNTER — Telehealth: Payer: Self-pay | Admitting: *Deleted

## 2014-06-01 NOTE — Telephone Encounter (Signed)
sats does drop Stay on O2 at bedtime  I spoke with patient about results and he verbalized understanding and had no questions

## 2014-06-20 DIAGNOSIS — C61 Malignant neoplasm of prostate: Secondary | ICD-10-CM | POA: Diagnosis not present

## 2014-06-20 DIAGNOSIS — N529 Male erectile dysfunction, unspecified: Secondary | ICD-10-CM | POA: Diagnosis not present

## 2014-06-20 DIAGNOSIS — Z9489 Other transplanted organ and tissue status: Secondary | ICD-10-CM | POA: Diagnosis not present

## 2014-06-30 DIAGNOSIS — E785 Hyperlipidemia, unspecified: Secondary | ICD-10-CM | POA: Diagnosis not present

## 2014-06-30 DIAGNOSIS — Z940 Kidney transplant status: Secondary | ICD-10-CM | POA: Diagnosis not present

## 2014-06-30 DIAGNOSIS — I129 Hypertensive chronic kidney disease with stage 1 through stage 4 chronic kidney disease, or unspecified chronic kidney disease: Secondary | ICD-10-CM | POA: Diagnosis not present

## 2014-06-30 DIAGNOSIS — N183 Chronic kidney disease, stage 3 unspecified: Secondary | ICD-10-CM | POA: Diagnosis not present

## 2014-06-30 DIAGNOSIS — E118 Type 2 diabetes mellitus with unspecified complications: Secondary | ICD-10-CM | POA: Diagnosis not present

## 2014-06-30 DIAGNOSIS — N2581 Secondary hyperparathyroidism of renal origin: Secondary | ICD-10-CM | POA: Diagnosis not present

## 2014-08-10 ENCOUNTER — Emergency Department (HOSPITAL_COMMUNITY)
Admission: EM | Admit: 2014-08-10 | Discharge: 2014-08-10 | Disposition: A | Discharge status: Home / Self Care | Payer: Medicare Other | Attending: Emergency Medicine | Admitting: Emergency Medicine

## 2014-08-10 ENCOUNTER — Encounter (HOSPITAL_COMMUNITY): Payer: Self-pay | Admitting: Emergency Medicine

## 2014-08-10 ENCOUNTER — Emergency Department (HOSPITAL_COMMUNITY): Payer: Medicare Other

## 2014-08-10 DIAGNOSIS — Z940 Kidney transplant status: Secondary | ICD-10-CM | POA: Diagnosis not present

## 2014-08-10 DIAGNOSIS — Z8659 Personal history of other mental and behavioral disorders: Secondary | ICD-10-CM | POA: Diagnosis not present

## 2014-08-10 DIAGNOSIS — Z7982 Long term (current) use of aspirin: Secondary | ICD-10-CM | POA: Insufficient documentation

## 2014-08-10 DIAGNOSIS — R1110 Vomiting, unspecified: Secondary | ICD-10-CM | POA: Diagnosis not present

## 2014-08-10 DIAGNOSIS — I10 Essential (primary) hypertension: Secondary | ICD-10-CM | POA: Insufficient documentation

## 2014-08-10 DIAGNOSIS — R000 Tachycardia, unspecified: Secondary | ICD-10-CM | POA: Diagnosis not present

## 2014-08-10 DIAGNOSIS — Z872 Personal history of diseases of the skin and subcutaneous tissue: Secondary | ICD-10-CM | POA: Diagnosis not present

## 2014-08-10 DIAGNOSIS — Z8669 Personal history of other diseases of the nervous system and sense organs: Secondary | ICD-10-CM | POA: Diagnosis not present

## 2014-08-10 DIAGNOSIS — Z87448 Personal history of other diseases of urinary system: Secondary | ICD-10-CM | POA: Insufficient documentation

## 2014-08-10 DIAGNOSIS — E119 Type 2 diabetes mellitus without complications: Secondary | ICD-10-CM | POA: Diagnosis not present

## 2014-08-10 DIAGNOSIS — K5289 Other specified noninfective gastroenteritis and colitis: Secondary | ICD-10-CM | POA: Diagnosis not present

## 2014-08-10 DIAGNOSIS — Z87891 Personal history of nicotine dependence: Secondary | ICD-10-CM | POA: Diagnosis not present

## 2014-08-10 DIAGNOSIS — Z794 Long term (current) use of insulin: Secondary | ICD-10-CM | POA: Insufficient documentation

## 2014-08-10 DIAGNOSIS — K529 Noninfective gastroenteritis and colitis, unspecified: Secondary | ICD-10-CM

## 2014-08-10 DIAGNOSIS — Z79899 Other long term (current) drug therapy: Secondary | ICD-10-CM | POA: Diagnosis not present

## 2014-08-10 DIAGNOSIS — Z8546 Personal history of malignant neoplasm of prostate: Secondary | ICD-10-CM | POA: Diagnosis not present

## 2014-08-10 DIAGNOSIS — R112 Nausea with vomiting, unspecified: Secondary | ICD-10-CM | POA: Diagnosis not present

## 2014-08-10 DIAGNOSIS — R197 Diarrhea, unspecified: Secondary | ICD-10-CM | POA: Diagnosis not present

## 2014-08-10 LAB — CBC WITH DIFFERENTIAL/PLATELET
BASOS PCT: 0 % (ref 0–1)
Basophils Absolute: 0 10*3/uL (ref 0.0–0.1)
EOS PCT: 2 % (ref 0–5)
Eosinophils Absolute: 0.1 10*3/uL (ref 0.0–0.7)
HEMATOCRIT: 47.9 % (ref 39.0–52.0)
HEMOGLOBIN: 15.3 g/dL (ref 13.0–17.0)
Lymphocytes Relative: 8 % — ABNORMAL LOW (ref 12–46)
Lymphs Abs: 0.4 10*3/uL — ABNORMAL LOW (ref 0.7–4.0)
MCH: 29.7 pg (ref 26.0–34.0)
MCHC: 31.9 g/dL (ref 30.0–36.0)
MCV: 92.8 fL (ref 78.0–100.0)
MONOS PCT: 11 % (ref 3–12)
Monocytes Absolute: 0.5 10*3/uL (ref 0.1–1.0)
NEUTROS ABS: 3.7 10*3/uL (ref 1.7–7.7)
NEUTROS PCT: 79 % — AB (ref 43–77)
Platelets: UNDETERMINED 10*3/uL (ref 150–400)
RBC: 5.16 MIL/uL (ref 4.22–5.81)
RDW: 13.6 % (ref 11.5–15.5)
WBC: 4.7 10*3/uL (ref 4.0–10.5)

## 2014-08-10 LAB — COMPREHENSIVE METABOLIC PANEL
ALK PHOS: 130 U/L — AB (ref 39–117)
ALT: 14 U/L (ref 0–53)
AST: 22 U/L (ref 0–37)
Albumin: 4 g/dL (ref 3.5–5.2)
Anion gap: 19 — ABNORMAL HIGH (ref 5–15)
BILIRUBIN TOTAL: 0.3 mg/dL (ref 0.3–1.2)
BUN: 53 mg/dL — AB (ref 6–23)
CHLORIDE: 96 meq/L (ref 96–112)
CO2: 19 meq/L (ref 19–32)
Calcium: 9.7 mg/dL (ref 8.4–10.5)
Creatinine, Ser: 2.03 mg/dL — ABNORMAL HIGH (ref 0.50–1.35)
GFR calc Af Amer: 39 mL/min — ABNORMAL LOW (ref >90)
GFR calc non Af Amer: 33 mL/min — ABNORMAL LOW (ref >90)
GLUCOSE: 213 mg/dL — AB (ref 70–99)
Potassium: 4.4 mEq/L (ref 3.7–5.3)
SODIUM: 134 meq/L — AB (ref 137–147)
Total Protein: 7.3 g/dL (ref 6.0–8.3)

## 2014-08-10 LAB — LIPASE, BLOOD: Lipase: 22 U/L (ref 11–59)

## 2014-08-10 LAB — URINALYSIS, ROUTINE W REFLEX MICROSCOPIC
Bilirubin Urine: NEGATIVE
Glucose, UA: NEGATIVE mg/dL
Hgb urine dipstick: NEGATIVE
KETONES UR: NEGATIVE mg/dL
Nitrite: NEGATIVE
PH: 6 (ref 5.0–8.0)
Protein, ur: NEGATIVE mg/dL
SPECIFIC GRAVITY, URINE: 1.01 (ref 1.005–1.030)
Urobilinogen, UA: 0.2 mg/dL (ref 0.0–1.0)

## 2014-08-10 LAB — URINE MICROSCOPIC-ADD ON

## 2014-08-10 LAB — CBG MONITORING, ED: Glucose-Capillary: 186 mg/dL — ABNORMAL HIGH (ref 70–99)

## 2014-08-10 MED ORDER — DIPHENOXYLATE-ATROPINE 2.5-0.025 MG PO TABS: 2.0000 | ORAL_TABLET | Freq: Four times a day (QID) | ORAL | Status: DC | PRN

## 2014-08-10 MED ORDER — CEPHALEXIN 500 MG PO CAPS: 500.0000 mg | ORAL_CAPSULE | Freq: Four times a day (QID) | ORAL | Status: DC

## 2014-08-10 MED ORDER — SODIUM CHLORIDE 0.9 % IV BOLUS (SEPSIS)
500.0000 mL | Freq: Once | INTRAVENOUS | Status: AC
  Administered 2014-08-10: 500 mL via INTRAVENOUS

## 2014-08-10 MED ORDER — SODIUM CHLORIDE 0.9 % IV SOLN
INTRAVENOUS | Status: DC
  Administered 2014-08-10: 18:00:00 via INTRAVENOUS

## 2014-08-10 MED ORDER — DIPHENOXYLATE-ATROPINE 2.5-0.025 MG PO TABS
2.0000 | ORAL_TABLET | Freq: Once | ORAL | Status: AC
  Administered 2014-08-10: 2 via ORAL
  Filled 2014-08-10: qty 2

## 2014-08-10 MED ORDER — ONDANSETRON 8 MG PO TBDP: 8.0000 mg | ORAL_TABLET | Freq: Three times a day (TID) | ORAL | Status: DC | PRN

## 2014-08-10 MED ORDER — SODIUM CHLORIDE 0.9 % IV BOLUS (SEPSIS): 1000.0000 mL | Freq: Once | INTRAVENOUS | Status: DC

## 2014-08-10 MED ORDER — ONDANSETRON HCL 4 MG/2ML IJ SOLN
4.0000 mg | Freq: Once | INTRAMUSCULAR | Status: AC
  Administered 2014-08-10: 4 mg via INTRAVENOUS
  Filled 2014-08-10: qty 2

## 2014-08-10 NOTE — ED Notes (Signed)
Pt states he had had diarrhea since Sunday along with vomiting that lasted Sunday. He still is dry heaving and having diarrhea. Pt was able to eat and keep his meal and liquid down for dinner last night. Denies abdominal pain, occasional abdominal cramping.

## 2014-08-10 NOTE — ED Provider Notes (Signed)
CSN: [id number]     Arrival date & time 08/10/14  1227 History   First MD Initiated Contact with Patient 08/10/14 1527     Chief Complaint  Patient presents with  . Emesis     (Consider location/radiation/quality/duration/timing/severity/associated sxs/prior Treatment) HPI Comments: Patient here complaining of abdominal cramping with nonbilious vomiting watery diarrhea that began after he ate a shrimp dinner 3 days ago. States other people his family had similar symptoms. Has had abdominal cramping without pain. No fever or chills. Does feel weak and tired but denies any syncope or near-syncope. Symptoms have been persistent and no treatment used prior to arrival. Nothing makes it better or worse. Denies any urinary symptoms. Patient does have a history of kidney transplant and states that his baseline creatinine is about 2  Patient is a 63 y.o. male presenting with vomiting. The history is provided by the patient and the spouse.  Emesis   Past Medical History  Diagnosis Date  . Diabetes mellitus without complication   . Cancer     Prostate cancer  . Renal failure   . Detached retina   . Hypertension   . Cellulitis and abscess of unspecified site 2014    lower back  . ADHD (attention deficit hyperactivity disorder)   . Cellulitis and abscess of trunk 2014    history of back abscess  . Elevated troponin   . Shortness of breath   . H/O kidney transplant    Past Surgical History  Procedure Laterality Date  . Kidney transplant  12/01/2011  . Prostectomy  11/2010  . Retinal detachment surgery  06/2002   Family History  Problem Relation Age of Onset  . Diabetes Mother   . Heart disease Maternal Grandfather   . Heart disease Maternal Grandmother    History  Substance Use Topics  . Smoking status: Former Smoker -- 1.00 packs/day for 21 years    Types: Cigarettes    Quit date: 03/16/1993  . Smokeless tobacco: Never Used  . Alcohol Use: 0.0 oz/week    1-2 Glasses of wine per  week    Review of Systems  Gastrointestinal: Positive for vomiting.  All other systems reviewed and are negative.     Allergies  Review of patient's allergies indicates no known allergies.  Home Medications   Prior to Admission medications   Medication Sig Start Date End Date Taking? Authorizing Provider  allopurinol (ZYLOPRIM) 300 MG tablet Take 300 mg by mouth daily.  03/11/12   Historical Provider, MD  aspirin EC 81 MG tablet Take 81 mg by mouth daily.     Historical Provider, MD  calcitRIOL (ROCALTROL) 0.25 MCG capsule Take 0.5 mcg by mouth daily.     Historical Provider, MD  cetirizine (ZYRTEC) 10 MG tablet Take 10 mg by mouth daily.    Historical Provider, MD  cinacalcet (SENSIPAR) 30 MG tablet Take 60 mg by mouth daily.     Historical Provider, MD  furosemide (LASIX) 80 MG tablet Take 2 tablets (160 mg total) by mouth 3 (three) times daily. 04/21/13   Marijean Heath, NP  glipiZIDE (GLUCOTROL XL) 10 MG 24 hr tablet Take 10 mg by mouth 2 (two) times daily.  03/11/12   Historical Provider, MD  insulin regular (NOVOLIN R,HUMULIN R) 100 units/mL injection Inject 0-8 Units into the skin 3 (three) times daily as needed for high blood sugar (per sliding scale).     Historical Provider, MD  metoprolol succinate (TOPROL-XL) 50 MG 24 hr tablet Take  1 tablet by mouth daily.    Historical Provider, MD  mycophenolate (MYFORTIC) 180 MG EC tablet Take 360 mg by mouth 2 (two) times daily.    Historical Provider, MD  omeprazole (PRILOSEC) 20 MG capsule Take 20 mg by mouth daily.    Historical Provider, MD  pravastatin (PRAVACHOL) 80 MG tablet Take 80 mg by mouth at bedtime.     Historical Provider, MD  sodium chloride (OCEAN) 0.65 % nasal spray Place 3-4 sprays into the nose at bedtime.    Historical Provider, MD  tacrolimus (PROGRAF) 1 MG capsule Take 2-3 mg by mouth 2 (two) times daily. Take 3 capsules (3 mg) every morning and 2 capsules (2 mg) every night    Historical Provider, MD   BP  125/72  Pulse 114  Temp(Src) 98 F (36.7 C) (Oral)  Resp 18  SpO2 100% Physical Exam  Nursing note and vitals reviewed. Constitutional: He is oriented to person, place, and time. He appears well-developed and well-nourished.  Non-toxic appearance. No distress.  HENT:  Head: Normocephalic and atraumatic.  Eyes: Conjunctivae, EOM and lids are normal. Pupils are equal, round, and reactive to light.  Neck: Normal range of motion. Neck supple. No tracheal deviation present. No mass present.  Cardiovascular: Regular rhythm and normal heart sounds.  Tachycardia present.  Exam reveals no gallop.   No murmur heard. Pulmonary/Chest: Effort normal and breath sounds normal. No stridor. No respiratory distress. He has no decreased breath sounds. He has no wheezes. He has no rhonchi. He has no rales.  Abdominal: Soft. Normal appearance and bowel sounds are normal. He exhibits no distension. There is tenderness. There is no rigidity, no rebound, no guarding and no CVA tenderness.  Musculoskeletal: Normal range of motion. He exhibits no edema and no tenderness.  Neurological: He is alert and oriented to person, place, and time. He has normal strength. No cranial nerve deficit or sensory deficit. GCS eye subscore is 4. GCS verbal subscore is 5. GCS motor subscore is 6.  Skin: Skin is warm and dry. No abrasion and no rash noted.  Psychiatric: He has a normal mood and affect. His speech is normal and behavior is normal.    ED Course  Procedures (including critical care time) Labs Review Labs Reviewed  CBC WITH DIFFERENTIAL - Abnormal; Notable for the following:    Neutrophils Relative % 79 (*)    Lymphocytes Relative 8 (*)    Lymphs Abs 0.4 (*)    All other components within normal limits  COMPREHENSIVE METABOLIC PANEL - Abnormal; Notable for the following:    Sodium 134 (*)    Glucose, Bld 213 (*)    BUN 53 (*)    Creatinine, Ser 2.03 (*)    Alkaline Phosphatase 130 (*)    GFR calc non Af Amer 33  (*)    GFR calc Af Amer 39 (*)    Anion gap 19 (*)    All other components within normal limits  CBG MONITORING, ED - Abnormal; Notable for the following:    Glucose-Capillary 186 (*)    All other components within normal limits  LIPASE, BLOOD  URINALYSIS, ROUTINE W REFLEX MICROSCOPIC    Imaging Review No results found.   EKG Interpretation None      MDM   Final diagnoses:  None    Patient given IV fluids and medications for vomiting and diarrhea. He has gastroenteritis. Heart rate has improved to low 90s. 3-6 white cells noted on his urinalysis and  we'll be treated for UTI. Repeat abdominal exam shows no signs of peritonitis. Creatinine is baseline. Patient feels better and will be discharged    Leota Jacobsen, MD 08/10/14 2003

## 2014-08-10 NOTE — ED Notes (Signed)
Pt presents to department for evaluation of abdominal cramping, nausea, vomiting and diarrhea. Onset Sunday evening after eating shrimp dish on Friday night. Pt states no relief of symptoms. Pt states he feels weak and tired. Pt is alert and oriented x4.

## 2014-08-10 NOTE — Discharge Instructions (Signed)

## 2014-08-10 NOTE — ED Notes (Signed)
CBG is 186. Notified Nurse Jinny Blossom.

## 2014-08-10 NOTE — ED Notes (Signed)
NAD at this time.  

## 2014-08-31 DIAGNOSIS — Z940 Kidney transplant status: Secondary | ICD-10-CM | POA: Diagnosis not present

## 2014-09-07 DIAGNOSIS — J9610 Chronic respiratory failure, unspecified whether with hypoxia or hypercapnia: Secondary | ICD-10-CM | POA: Diagnosis not present

## 2014-09-07 DIAGNOSIS — Z23 Encounter for immunization: Secondary | ICD-10-CM | POA: Diagnosis not present

## 2014-09-07 DIAGNOSIS — Z940 Kidney transplant status: Secondary | ICD-10-CM | POA: Diagnosis not present

## 2014-09-07 DIAGNOSIS — E7800 Pure hypercholesterolemia, unspecified: Secondary | ICD-10-CM | POA: Diagnosis not present

## 2014-09-07 DIAGNOSIS — I129 Hypertensive chronic kidney disease with stage 1 through stage 4 chronic kidney disease, or unspecified chronic kidney disease: Secondary | ICD-10-CM | POA: Diagnosis not present

## 2014-09-07 DIAGNOSIS — E1129 Type 2 diabetes mellitus with other diabetic kidney complication: Secondary | ICD-10-CM | POA: Diagnosis not present

## 2014-09-07 DIAGNOSIS — I519 Heart disease, unspecified: Secondary | ICD-10-CM | POA: Diagnosis not present

## 2014-09-07 DIAGNOSIS — R197 Diarrhea, unspecified: Secondary | ICD-10-CM | POA: Diagnosis not present

## 2014-09-13 ENCOUNTER — Ambulatory Visit: Payer: Medicare Other | Admitting: Pulmonary Disease

## 2014-09-19 DIAGNOSIS — Z940 Kidney transplant status: Secondary | ICD-10-CM | POA: Diagnosis not present

## 2014-09-21 ENCOUNTER — Ambulatory Visit: Payer: Medicare Other | Admitting: Adult Health

## 2014-09-21 DIAGNOSIS — Z1211 Encounter for screening for malignant neoplasm of colon: Secondary | ICD-10-CM | POA: Diagnosis not present

## 2014-09-21 DIAGNOSIS — R197 Diarrhea, unspecified: Secondary | ICD-10-CM | POA: Diagnosis not present

## 2014-09-22 HISTORY — PX: COLONOSCOPY: SHX174

## 2014-09-29 ENCOUNTER — Other Ambulatory Visit: Payer: Self-pay | Admitting: Gastroenterology

## 2014-09-29 DIAGNOSIS — K529 Noninfective gastroenteritis and colitis, unspecified: Secondary | ICD-10-CM | POA: Diagnosis not present

## 2014-09-29 DIAGNOSIS — D123 Benign neoplasm of transverse colon: Secondary | ICD-10-CM | POA: Diagnosis not present

## 2014-10-06 DIAGNOSIS — I129 Hypertensive chronic kidney disease with stage 1 through stage 4 chronic kidney disease, or unspecified chronic kidney disease: Secondary | ICD-10-CM | POA: Diagnosis not present

## 2014-10-06 DIAGNOSIS — N2581 Secondary hyperparathyroidism of renal origin: Secondary | ICD-10-CM | POA: Diagnosis not present

## 2014-10-06 DIAGNOSIS — Z940 Kidney transplant status: Secondary | ICD-10-CM | POA: Diagnosis not present

## 2014-10-06 DIAGNOSIS — E118 Type 2 diabetes mellitus with unspecified complications: Secondary | ICD-10-CM | POA: Diagnosis not present

## 2014-10-06 DIAGNOSIS — M109 Gout, unspecified: Secondary | ICD-10-CM | POA: Diagnosis not present

## 2014-10-06 DIAGNOSIS — N183 Chronic kidney disease, stage 3 (moderate): Secondary | ICD-10-CM | POA: Diagnosis not present

## 2014-10-11 ENCOUNTER — Ambulatory Visit: Payer: Medicare Other | Admitting: Pulmonary Disease

## 2014-10-17 DIAGNOSIS — N2581 Secondary hyperparathyroidism of renal origin: Secondary | ICD-10-CM | POA: Diagnosis not present

## 2014-10-17 DIAGNOSIS — Z940 Kidney transplant status: Secondary | ICD-10-CM | POA: Diagnosis not present

## 2014-10-22 DIAGNOSIS — Z940 Kidney transplant status: Secondary | ICD-10-CM | POA: Diagnosis not present

## 2014-10-26 DIAGNOSIS — Z23 Encounter for immunization: Secondary | ICD-10-CM | POA: Diagnosis not present

## 2014-10-27 DIAGNOSIS — Z940 Kidney transplant status: Secondary | ICD-10-CM | POA: Diagnosis not present

## 2014-10-27 DIAGNOSIS — I129 Hypertensive chronic kidney disease with stage 1 through stage 4 chronic kidney disease, or unspecified chronic kidney disease: Secondary | ICD-10-CM | POA: Diagnosis not present

## 2014-11-02 DIAGNOSIS — Z940 Kidney transplant status: Secondary | ICD-10-CM | POA: Diagnosis not present

## 2014-11-07 DIAGNOSIS — R197 Diarrhea, unspecified: Secondary | ICD-10-CM | POA: Diagnosis not present

## 2014-11-07 DIAGNOSIS — Z86010 Personal history of colonic polyps: Secondary | ICD-10-CM | POA: Diagnosis not present

## 2014-11-08 ENCOUNTER — Encounter: Payer: Self-pay | Admitting: Pulmonary Disease

## 2014-11-08 ENCOUNTER — Ambulatory Visit (INDEPENDENT_AMBULATORY_CARE_PROVIDER_SITE_OTHER): Payer: BC Managed Care – PPO | Admitting: Pulmonary Disease

## 2014-11-08 VITALS — BP 100/58 | HR 79 | Ht 69.0 in | Wt 217.0 lb

## 2014-11-08 DIAGNOSIS — R0600 Dyspnea, unspecified: Secondary | ICD-10-CM | POA: Diagnosis not present

## 2014-11-08 DIAGNOSIS — J9611 Chronic respiratory failure with hypoxia: Secondary | ICD-10-CM | POA: Diagnosis not present

## 2014-11-08 DIAGNOSIS — G4733 Obstructive sleep apnea (adult) (pediatric): Secondary | ICD-10-CM

## 2014-11-08 DIAGNOSIS — J9622 Acute and chronic respiratory failure with hypercapnia: Secondary | ICD-10-CM

## 2014-11-08 DIAGNOSIS — N390 Urinary tract infection, site not specified: Secondary | ICD-10-CM | POA: Diagnosis not present

## 2014-11-08 NOTE — Progress Notes (Signed)
   Subjective:    Patient ID: Russell Bowers, male    DOB: May 12, 1951, 63 y.o.   MRN: [id number]  HPI  63 year old ex-smoker, renal transplant recipient with mod OSA, diaphragmatic paralysis and hypercarbic respiratory failure. He quit smoking 1994 (20-pack-year)  Significant tests/ events  Polysomnogram at [facility] in 02/2012 showed moderate obstructive sleep apnea with AHI of 25 events per hour, lowest desaturation of 71%. Unfortunately he did not tolerate CPAP on a subsequent titration study.  -admitted 03/2013 with acute hypercarbic resp failure in setting H Flu HCAP (similar recent admit February at [facility]) and likely decompensated OSA. Required mechanical ventilation Course c/b acute on chronic renal insufficiency, poor glycemic control and back abscess with hx peptostreptococcus.  PFTs showed severe restriction with a ratio 77, FVC 1.35-32%, FEV1 1.04-32% and TLC of 3.0 L-46% with DLCO at 52% that corrects for alveolar volumes suggestive of extra parenchymal restriction. Sniff test showed minimal mobility of diaphragms.   Nocturnal oximetry showed severe desaturation on 2 L of oxygen with a pattern suggestive hypoventilation, he spent 393 minutes with sats less than 88%.   BiPAP titration- 218 pounds-t 13/9 centimeters with a medium fullface mask with 2 liters of oxygen blended  >>04/2014 could not tolerate high pressure, hence changed to autobipap  Downloads reviewed on bipap 13/9 & auto >> no residuals, no leak, good usage   11/08/2014 Chief Complaint  Patient presents with  . Follow-up    f/u OSA; having issue with dryness in throat, mouth; so bad it wakes him up and he has to get a glass of water; worried that pressure may be too high; change out water reservoir and still not helping.    Stomach bug in aug - cscopy  Download 10/2014 - on 15/5, no residuals , noleak  On last visit,Desatn transiently to 85% on walking & recovers on resting ONO - satn drop - stay on  O2 Today , on exertion-sat went down to 93% He still complains of too high pressure, average pressure is 15 / 5 on download  Review of Systems neg for any significant sore throat, dysphagia, itching, sneezing, nasal congestion or excess/ purulent secretions, fever, chills, sweats, unintended wt loss, pleuritic or exertional cp, hempoptysis, orthopnea pnd or change in chronic leg swelling. Also denies presyncope, palpitations, heartburn, abdominal pain, nausea, vomiting, diarrhea or change in bowel or urinary habits, dysuria,hematuria, rash, arthralgias, visual complaints, headache, numbness weakness or ataxia.     Objective:   Physical Exam  Gen. Pleasant, well-nourished, in no distress ENT - no lesions, no post nasal drip Neck: No JVD, no thyromegaly, no carotid bruits Lungs: no use of accessory muscles, no dullness to percussion, clear without rales or rhonchi  Cardiovascular: Rhythm regular, heart sounds  normal, no murmurs or gallops, no peripheral edema Musculoskeletal: No deformities, no cyanosis or clubbing        Assessment & Plan:

## 2014-11-08 NOTE — Patient Instructions (Signed)
Ambulatory satn - if OK, can dc portable oxygen Continue oxygen use at night Drop Bipap to 12/5 & rechk download in 1 month  - hopefully this will help with dryness issues

## 2014-11-09 ENCOUNTER — Ambulatory Visit (INDEPENDENT_AMBULATORY_CARE_PROVIDER_SITE_OTHER): Payer: Medicare Other | Admitting: Cardiology

## 2014-11-09 ENCOUNTER — Encounter: Payer: Self-pay | Admitting: Cardiology

## 2014-11-09 VITALS — BP 118/68 | HR 85 | Ht 69.0 in | Wt 214.0 lb

## 2014-11-09 DIAGNOSIS — E669 Obesity, unspecified: Secondary | ICD-10-CM | POA: Diagnosis not present

## 2014-11-09 DIAGNOSIS — G4733 Obstructive sleep apnea (adult) (pediatric): Secondary | ICD-10-CM

## 2014-11-09 DIAGNOSIS — I4510 Unspecified right bundle-branch block: Secondary | ICD-10-CM

## 2014-11-09 NOTE — Patient Instructions (Signed)
The current medical regimen is effective;  continue present plan and medications.  Follow up in 1 year with Dr Skains.  You will receive a letter in the mail 2 months before you are due.  Please call us when you receive this letter to schedule your follow up appointment.  

## 2014-11-09 NOTE — Assessment & Plan Note (Signed)
We'll reassess need for nocturnal oxygen at some point

## 2014-11-09 NOTE — Assessment & Plan Note (Signed)
can dc portable oxygen Continue oxygen use at night Drop Bipap to 12/5 & rechk download in 1 month  - hopefully this will help with dryness issues Recheck ONO at some point 2 reassess need for nocturnal oxygen

## 2014-11-09 NOTE — Progress Notes (Signed)
[place]. [address] [place], [place]  [address] Phone: [phone] Fax:  [phone]  Date:  11/09/2014   ID:  NYZIER Russell Bowers, DOB 1951/07/15, MRN [id number]  PCP:  Russell Pac, MD  Pulmonary: Elsworth Soho   History of Present Illness: Russell Bowers is a 63 y.o. male hypercarbic respiratory failure, who had transient elevation in troponin during respiratory failure. comorbidities include renal transplantation. Echocardiogram 03/2013 demonstrated normal ejection fraction of 60% with no wall motion abnormality, mild aortic stenosis which is chronic. Right bundle branch block noted, chronic. He did undergo stress test in September of 2012 which was low risk with no evidence of ischemia, normal EF.  He has remained stable over the last year.  In August he developed gastritis and still has occasional diarrhea.  He is working on correlating symptoms with diet.  He is followed by GI.   After his kidney transplant, maintaining fluid balance very well. He weighs himself daily at home. Blood pressure has been well controlled. He is on high dose Lasix as directed by nephrology.  His dose has been titrated by nephrology during recent GI illness.  Recent renal function at baseline per patient.    Wt Readings from Last 3 Encounters:  11/09/14 214 lb (97.07 kg)  11/08/14 217 lb (98.431 kg)  05/23/14 221 lb 9.6 oz (100.517 kg)     Past Medical History  Diagnosis Date  . Diabetes mellitus without complication   . Cancer     Prostate cancer  . Renal failure   . Detached retina   . Hypertension   . Cellulitis and abscess of unspecified site 2014    lower back  . ADHD (attention deficit hyperactivity disorder)   . Cellulitis and abscess of trunk 2014    history of back abscess  . Elevated troponin   . Shortness of breath   . H/O kidney transplant     Past Surgical History  Procedure Laterality Date  . Kidney transplant  12/01/2011  . Prostectomy  11/2010  . Retinal  detachment surgery  06/2002    Current Outpatient Prescriptions  Medication Sig Dispense Refill  . acetaminophen (TYLENOL) 500 MG tablet Take 1,000 mg by mouth every 6 (six) hours as needed for moderate pain.    [name] allopurinol (ZYLOPRIM) 300 MG tablet Take 300 mg by mouth daily.     . calcitRIOL (ROCALTROL) 0.25 MCG capsule Take 0.5 mcg by mouth 3 (three) times daily.     . cinacalcet (SENSIPAR) 30 MG tablet Take 30 mg by mouth daily with breakfast.     . furosemide (LASIX) 80 MG tablet Take 160 mg by mouth daily.    [name] glipiZIDE (GLUCOTROL XL) 10 MG 24 hr tablet Take 10 mg by mouth 2 (two) times daily.     . insulin glargine (LANTUS) 100 UNIT/ML injection Inject 15 Units into the skin at bedtime.    . insulin regular (NOVOLIN R,HUMULIN R) 100 units/mL injection Inject 0-8 Units into the skin 3 (three) times daily as needed for high blood sugar (per sliding scale).     [name] loperamide (IMODIUM) 2 MG capsule Take 2 mg by mouth as needed for diarrhea or loose stools.    . Magnesium Oxide 500 MG TABS Take 500 mg by mouth 2 (two) times daily.    . metoprolol succinate (TOPROL-XL) 50 MG 24 hr tablet Take 1 tablet by mouth daily.    . mycophenolate (MYFORTIC) 180 MG EC tablet Take  360 mg by mouth 2 (two) times daily.    [name] omeprazole (PRILOSEC) 20 MG capsule Take 20 mg by mouth daily.    . pravastatin (PRAVACHOL) 80 MG tablet Take 80 mg by mouth at bedtime.     . sodium chloride (OCEAN) 0.65 % nasal spray Place 3-4 sprays into the nose at bedtime.    . tacrolimus (PROGRAF) 1 MG capsule Take 1 mg by mouth 2 (two) times daily.      No current facility-administered medications for this visit.    Allergies:   No Known Allergies  Social History:  The patient  reports that he quit smoking about 21 years ago. His smoking use included Cigarettes. He has a 21 pack-year smoking history. He has never used smokeless tobacco. He reports that he drinks alcohol. He reports that he does not use illicit drugs.   ROS:   Please see the history of present illness.   No recent edema, no orthopnea, no chest pain  PHYSICAL EXAM: VS:  BP 118/68 mmHg  Pulse 85  Ht 5\' 9"  (1.753 m)  Wt 214 lb (97.07 kg)  BMI 31.59 kg/m2 Well nourished, well developed, in no acute distress HEENT: normal Neck: no JVD Cardiac:  normal S1, S2; RRR; 1/6 murmurdistant HS Lungs:  clear to auscultation bilaterally, no wheezing, rhonchi or rales Abd: soft, nontender, no hepatomegaly Ext: no edema Skin: warm and dry Neuro: no focal abnormalities noted  ECHO: 04/12/13: - Left ventricle: The cavity size was normal. There was mild focal basal hypertrophy of the septum. Systolic function was normal. The estimated ejection fraction was in the range of 60% to 65%. Although no diagnostic regional wall motion abnormality was identified, this possibility cannot be completely excluded on the basis of this study. - Aortic valve: There was mild stenosis. Trivial regurgitation. Valve area: 2.32cm^2(VTI). Valve area: 1.54cm^2 (Vmax). - Mitral valve: Calcified annulus. Mildly calcified leaflets . Mild regurgitation. - Left atrium: The atrium was mildly dilated.  ASSESSMENT AND PLAN:  1. Previous episode of diastolic heart failure in the setting of hypercarbic respiratory failure and pneumonia-currently doing well, no symptoms. Continue to maintain fluid balance with furosemide as directed by nephrology. No major changes. Prior stress test reassuring. Normal EF. 2. Diabetes-per primary team 3. Mild AS - echo reviewed. Watching clinically. Could be from calcification during dialysis. 4. Status post kidney transplant-nephrology. Overall doing well. 5. Right bundle branch block-chronic 6. Obesity-encourage weight loss.  7. Obstructive sleep apnea-doing very well on CPAP. 8. One-year follow-up  Signed, Candee Furbish, MD [facility]  11/09/2014 10:25 AM

## 2014-11-11 DIAGNOSIS — H25813 Combined forms of age-related cataract, bilateral: Secondary | ICD-10-CM | POA: Diagnosis not present

## 2014-11-11 DIAGNOSIS — E119 Type 2 diabetes mellitus without complications: Secondary | ICD-10-CM | POA: Diagnosis not present

## 2014-11-21 DIAGNOSIS — H2512 Age-related nuclear cataract, left eye: Secondary | ICD-10-CM | POA: Diagnosis not present

## 2014-11-21 DIAGNOSIS — H25042 Posterior subcapsular polar age-related cataract, left eye: Secondary | ICD-10-CM | POA: Diagnosis not present

## 2014-11-22 HISTORY — PX: CATARACT EXTRACTION, BILATERAL: SHX1313

## 2014-11-23 DIAGNOSIS — H2511 Age-related nuclear cataract, right eye: Secondary | ICD-10-CM | POA: Diagnosis not present

## 2014-11-28 DIAGNOSIS — H2511 Age-related nuclear cataract, right eye: Secondary | ICD-10-CM | POA: Diagnosis not present

## 2014-11-28 DIAGNOSIS — H5231 Anisometropia: Secondary | ICD-10-CM | POA: Diagnosis not present

## 2014-11-28 DIAGNOSIS — H25811 Combined forms of age-related cataract, right eye: Secondary | ICD-10-CM | POA: Diagnosis not present

## 2014-12-15 ENCOUNTER — Telehealth: Payer: Self-pay | Admitting: Pulmonary Disease

## 2014-12-15 NOTE — Telephone Encounter (Signed)
Download 12/11/14 on bipap 12/5 >> no residuals , good usage Hope his dryness is better

## 2014-12-19 DIAGNOSIS — C61 Malignant neoplasm of prostate: Secondary | ICD-10-CM | POA: Diagnosis not present

## 2014-12-19 DIAGNOSIS — N528 Other male erectile dysfunction: Secondary | ICD-10-CM | POA: Diagnosis not present

## 2014-12-19 DIAGNOSIS — Z9489 Other transplanted organ and tissue status: Secondary | ICD-10-CM | POA: Diagnosis not present

## 2014-12-19 NOTE — Telephone Encounter (Signed)
Lmtcb. 12/28

## 2014-12-19 NOTE — Telephone Encounter (Signed)
Patient notified.  Patient states that the dryness is much better, he says he keeps a glass of water by the bed at night and he is doing fine.  Nothing further needed.

## 2014-12-20 DIAGNOSIS — E785 Hyperlipidemia, unspecified: Secondary | ICD-10-CM | POA: Diagnosis not present

## 2014-12-20 DIAGNOSIS — Z4822 Encounter for aftercare following kidney transplant: Secondary | ICD-10-CM | POA: Diagnosis not present

## 2014-12-20 DIAGNOSIS — Z8546 Personal history of malignant neoplasm of prostate: Secondary | ICD-10-CM | POA: Diagnosis not present

## 2014-12-20 DIAGNOSIS — Z8709 Personal history of other diseases of the respiratory system: Secondary | ICD-10-CM | POA: Diagnosis not present

## 2014-12-20 DIAGNOSIS — Z794 Long term (current) use of insulin: Secondary | ICD-10-CM | POA: Diagnosis not present

## 2014-12-20 DIAGNOSIS — D899 Disorder involving the immune mechanism, unspecified: Secondary | ICD-10-CM | POA: Diagnosis not present

## 2014-12-20 DIAGNOSIS — D8989 Other specified disorders involving the immune mechanism, not elsewhere classified: Secondary | ICD-10-CM | POA: Diagnosis not present

## 2014-12-20 DIAGNOSIS — Z6832 Body mass index (BMI) 32.0-32.9, adult: Secondary | ICD-10-CM | POA: Diagnosis not present

## 2014-12-20 DIAGNOSIS — E119 Type 2 diabetes mellitus without complications: Secondary | ICD-10-CM | POA: Diagnosis not present

## 2014-12-20 DIAGNOSIS — Z87898 Personal history of other specified conditions: Secondary | ICD-10-CM | POA: Diagnosis not present

## 2014-12-20 DIAGNOSIS — I10 Essential (primary) hypertension: Secondary | ICD-10-CM | POA: Diagnosis not present

## 2014-12-20 DIAGNOSIS — Z940 Kidney transplant status: Secondary | ICD-10-CM | POA: Diagnosis not present

## 2014-12-20 DIAGNOSIS — Z87448 Personal history of other diseases of urinary system: Secondary | ICD-10-CM | POA: Diagnosis not present

## 2014-12-30 ENCOUNTER — Other Ambulatory Visit: Payer: Self-pay | Admitting: Dermatology

## 2014-12-30 DIAGNOSIS — D1801 Hemangioma of skin and subcutaneous tissue: Secondary | ICD-10-CM | POA: Diagnosis not present

## 2014-12-30 DIAGNOSIS — L280 Lichen simplex chronicus: Secondary | ICD-10-CM | POA: Diagnosis not present

## 2014-12-30 DIAGNOSIS — L821 Other seborrheic keratosis: Secondary | ICD-10-CM | POA: Diagnosis not present

## 2015-01-02 DIAGNOSIS — Z940 Kidney transplant status: Secondary | ICD-10-CM | POA: Diagnosis not present

## 2015-01-02 DIAGNOSIS — E118 Type 2 diabetes mellitus with unspecified complications: Secondary | ICD-10-CM | POA: Diagnosis not present

## 2015-01-02 DIAGNOSIS — E785 Hyperlipidemia, unspecified: Secondary | ICD-10-CM | POA: Diagnosis not present

## 2015-01-02 DIAGNOSIS — N2581 Secondary hyperparathyroidism of renal origin: Secondary | ICD-10-CM | POA: Diagnosis not present

## 2015-01-02 DIAGNOSIS — M109 Gout, unspecified: Secondary | ICD-10-CM | POA: Diagnosis not present

## 2015-01-02 DIAGNOSIS — N183 Chronic kidney disease, stage 3 (moderate): Secondary | ICD-10-CM | POA: Diagnosis not present

## 2015-01-02 DIAGNOSIS — N390 Urinary tract infection, site not specified: Secondary | ICD-10-CM | POA: Diagnosis not present

## 2015-01-02 DIAGNOSIS — I129 Hypertensive chronic kidney disease with stage 1 through stage 4 chronic kidney disease, or unspecified chronic kidney disease: Secondary | ICD-10-CM | POA: Diagnosis not present

## 2015-03-28 DIAGNOSIS — Z940 Kidney transplant status: Secondary | ICD-10-CM | POA: Diagnosis not present

## 2015-04-04 DIAGNOSIS — N183 Chronic kidney disease, stage 3 (moderate): Secondary | ICD-10-CM | POA: Diagnosis not present

## 2015-04-04 DIAGNOSIS — I129 Hypertensive chronic kidney disease with stage 1 through stage 4 chronic kidney disease, or unspecified chronic kidney disease: Secondary | ICD-10-CM | POA: Diagnosis not present

## 2015-04-04 DIAGNOSIS — R5383 Other fatigue: Secondary | ICD-10-CM | POA: Diagnosis not present

## 2015-04-04 DIAGNOSIS — Z940 Kidney transplant status: Secondary | ICD-10-CM | POA: Diagnosis not present

## 2015-04-04 DIAGNOSIS — N390 Urinary tract infection, site not specified: Secondary | ICD-10-CM | POA: Diagnosis not present

## 2015-04-04 DIAGNOSIS — D631 Anemia in chronic kidney disease: Secondary | ICD-10-CM | POA: Diagnosis not present

## 2015-05-10 ENCOUNTER — Ambulatory Visit: Payer: Medicare Other | Admitting: Adult Health

## 2015-05-16 ENCOUNTER — Encounter: Payer: Self-pay | Admitting: Pulmonary Disease

## 2015-05-16 ENCOUNTER — Ambulatory Visit (INDEPENDENT_AMBULATORY_CARE_PROVIDER_SITE_OTHER): Payer: BLUE CROSS/BLUE SHIELD | Admitting: Pulmonary Disease

## 2015-05-16 VITALS — BP 110/62 | HR 86 | Ht 69.0 in | Wt 217.0 lb

## 2015-05-16 DIAGNOSIS — J9611 Chronic respiratory failure with hypoxia: Secondary | ICD-10-CM

## 2015-05-16 DIAGNOSIS — G4733 Obstructive sleep apnea (adult) (pediatric): Secondary | ICD-10-CM

## 2015-05-16 NOTE — Patient Instructions (Signed)
Check oxygen levels during sleep (with CPAP ) to decide need for oxygen

## 2015-05-16 NOTE — Progress Notes (Signed)
   Subjective:    Patient ID: Russell Bowers, male    DOB: Mar 21, 1951, 64 y.o.   MRN: [id number]  HPI  64 year old ex-smoker, renal transplant recipient with mod OSA, diaphragmatic paralysis and hypercarbic respiratory failure. He quit smoking 1994 (20-pack-year)  Significant tests/ events  Polysomnogram at [facility] in 02/2012 showed moderate obstructive sleep apnea with AHI of 25 events per hour, lowest desaturation of 71%. Unfortunately he did not tolerate CPAP on a subsequent titration study.  -admitted 03/2013 with acute hypercarbic resp failure in setting H Flu HCAP (similar recent admit February at [facility]) and likely decompensated OSA. Required mechanical ventilation Course c/b acute on chronic renal insufficiency, poor glycemic control and back abscess with hx peptostreptococcus.   PFTs showed severe restriction with a ratio 77, FVC 1.35-32%, FEV1 1.04-32% and TLC of 3.0 L-46% with DLCO at 52% that corrects for alveolar volumes suggestive of extra parenchymal restriction. Sniff test showed minimal mobility of diaphragms.   Nocturnal oximetry showed severe desaturation on 2 L of oxygen with a pattern suggestive hypoventilation, he spent 393 minutes with sats less than 88%.   BiPAP titration- 218 pounds-t 13/9 centimeters with a medium fullface mask with 2 liters of oxygen blended  >>04/2014 could not tolerate high pressure, hence changed to autobipap  Downloads reviewed on bipap 13/9 & auto >> no residuals, no leak, good usage   05/16/2015  Chief Complaint  Patient presents with  . Follow-up    Wearing BiPAP every night.  Approx 7-8 hours. no problems with mask fit.  Only problem is dryness, but better with pressure change.    Download 10/2014 - on 15/5, no residuals , noleak  On prior visits,Desatn transiently to 85% on walking & recovers on resting ONO - satn drop - stay on O2 10/2014  on exertion-sat went down to 93%  Download 12/11/14 on bipap 12/5 >> no residuals , good  usage On last visit he was complaining of pressure being too high, this was dropped to 12/ 5 23m Download 04/2015 on 12/5 >> no residuals, no leak, good usage He has been skipping oxygen at night for a few days and feels quite well  Review of Systems neg for any significant sore throat, dysphagia, itching, sneezing, nasal congestion or excess/ purulent secretions, fever, chills, sweats, unintended wt loss, pleuritic or exertional cp, hempoptysis, orthopnea pnd or change in chronic leg swelling. Also denies presyncope, palpitations, heartburn, abdominal pain, nausea, vomiting, diarrhea or change in bowel or urinary habits, dysuria,hematuria, rash, arthralgias, visual complaints, headache, numbness weakness or ataxia.     Objective:   Physical Exam  Gen. Pleasant, well-nourished, in no distress ENT - no lesions, no post nasal drip Neck: No JVD, no thyromegaly, no carotid bruits Lungs: no use of accessory muscles, no dullness to percussion, clear without rales or rhonchi  Cardiovascular: Rhythm regular, heart sounds  normal, no murmurs or gallops, no peripheral edema Musculoskeletal: No deformities, no cyanosis or clubbing         Assessment & Plan:

## 2015-05-17 NOTE — Assessment & Plan Note (Signed)
Continue BiPAP 12/5  Weight loss encouraged, compliance with goal of at least 4-6 hrs every night is the expectation. Advised against medications with sedative side effects Cautioned against driving when sleepy - understanding that sleepiness will vary on a day to day basis

## 2015-05-17 NOTE — Assessment & Plan Note (Signed)
Check nocturnal oximetry on BiPAP and room air-he may not need the oxygen anymore hopefully

## 2015-05-31 DIAGNOSIS — R197 Diarrhea, unspecified: Secondary | ICD-10-CM | POA: Diagnosis not present

## 2015-06-01 ENCOUNTER — Telehealth: Payer: Self-pay | Admitting: Pulmonary Disease

## 2015-06-01 ENCOUNTER — Encounter: Payer: Self-pay | Admitting: Pulmonary Disease

## 2015-06-01 NOTE — Telephone Encounter (Signed)
Desat 61 times per hour Prefer to stay on O2 Reassess in 6 months

## 2015-06-01 NOTE — Telephone Encounter (Signed)
lmtcb

## 2015-06-05 NOTE — Telephone Encounter (Signed)
Patient notified of results.  Placed on Recall list for 6 month appointment. Nothing further needed.

## 2015-06-05 NOTE — Telephone Encounter (Signed)
Pt returned call - [phone]

## 2015-06-28 ENCOUNTER — Encounter: Payer: Self-pay | Admitting: Pulmonary Disease

## 2015-06-28 DIAGNOSIS — Z940 Kidney transplant status: Secondary | ICD-10-CM | POA: Diagnosis not present

## 2015-07-03 DIAGNOSIS — Z9489 Other transplanted organ and tissue status: Secondary | ICD-10-CM | POA: Diagnosis not present

## 2015-07-03 DIAGNOSIS — C61 Malignant neoplasm of prostate: Secondary | ICD-10-CM | POA: Diagnosis not present

## 2015-07-03 DIAGNOSIS — N528 Other male erectile dysfunction: Secondary | ICD-10-CM | POA: Diagnosis not present

## 2015-07-04 DIAGNOSIS — N183 Chronic kidney disease, stage 3 (moderate): Secondary | ICD-10-CM | POA: Diagnosis not present

## 2015-07-04 DIAGNOSIS — Z940 Kidney transplant status: Secondary | ICD-10-CM | POA: Diagnosis not present

## 2015-07-04 DIAGNOSIS — E118 Type 2 diabetes mellitus with unspecified complications: Secondary | ICD-10-CM | POA: Diagnosis not present

## 2015-07-04 DIAGNOSIS — I129 Hypertensive chronic kidney disease with stage 1 through stage 4 chronic kidney disease, or unspecified chronic kidney disease: Secondary | ICD-10-CM | POA: Diagnosis not present

## 2015-07-26 DIAGNOSIS — R197 Diarrhea, unspecified: Secondary | ICD-10-CM | POA: Diagnosis not present

## 2015-08-02 DIAGNOSIS — N2581 Secondary hyperparathyroidism of renal origin: Secondary | ICD-10-CM | POA: Diagnosis not present

## 2015-08-02 DIAGNOSIS — Z940 Kidney transplant status: Secondary | ICD-10-CM | POA: Diagnosis not present

## 2015-08-02 DIAGNOSIS — M10379 Gout due to renal impairment, unspecified ankle and foot: Secondary | ICD-10-CM | POA: Diagnosis not present

## 2015-08-02 DIAGNOSIS — N183 Chronic kidney disease, stage 3 (moderate): Secondary | ICD-10-CM | POA: Diagnosis not present

## 2015-08-02 DIAGNOSIS — E118 Type 2 diabetes mellitus with unspecified complications: Secondary | ICD-10-CM | POA: Diagnosis not present

## 2015-09-27 DIAGNOSIS — Z940 Kidney transplant status: Secondary | ICD-10-CM | POA: Diagnosis not present

## 2015-10-03 DIAGNOSIS — N183 Chronic kidney disease, stage 3 (moderate): Secondary | ICD-10-CM | POA: Diagnosis not present

## 2015-10-03 DIAGNOSIS — Z23 Encounter for immunization: Secondary | ICD-10-CM | POA: Diagnosis not present

## 2015-10-03 DIAGNOSIS — E118 Type 2 diabetes mellitus with unspecified complications: Secondary | ICD-10-CM | POA: Diagnosis not present

## 2015-10-03 DIAGNOSIS — I129 Hypertensive chronic kidney disease with stage 1 through stage 4 chronic kidney disease, or unspecified chronic kidney disease: Secondary | ICD-10-CM | POA: Diagnosis not present

## 2015-10-03 DIAGNOSIS — D631 Anemia in chronic kidney disease: Secondary | ICD-10-CM | POA: Diagnosis not present

## 2015-10-17 ENCOUNTER — Telehealth: Payer: Self-pay | Admitting: Pulmonary Disease

## 2015-10-17 DIAGNOSIS — J9611 Chronic respiratory failure with hypoxia: Secondary | ICD-10-CM

## 2015-10-17 DIAGNOSIS — G4733 Obstructive sleep apnea (adult) (pediatric): Secondary | ICD-10-CM

## 2015-10-17 NOTE — Telephone Encounter (Signed)
Spoke with pt. States that he wants to switch DME companies. He is currently with [facility] and wants to switch back to [facility]. Pt uses BiPAP and oxygen at night. Advised him that some times it's not as easy as just switching companies. Pt has upcoming appointment with RA next month. Order will be placed for this.

## 2015-10-18 DIAGNOSIS — G4733 Obstructive sleep apnea (adult) (pediatric): Secondary | ICD-10-CM | POA: Diagnosis not present

## 2015-10-18 DIAGNOSIS — M109 Gout, unspecified: Secondary | ICD-10-CM | POA: Diagnosis not present

## 2015-10-18 DIAGNOSIS — E1165 Type 2 diabetes mellitus with hyperglycemia: Secondary | ICD-10-CM | POA: Diagnosis not present

## 2015-10-18 DIAGNOSIS — E7800 Pure hypercholesterolemia, unspecified: Secondary | ICD-10-CM | POA: Diagnosis not present

## 2015-10-18 DIAGNOSIS — Z23 Encounter for immunization: Secondary | ICD-10-CM | POA: Diagnosis not present

## 2015-10-18 DIAGNOSIS — Z794 Long term (current) use of insulin: Secondary | ICD-10-CM | POA: Diagnosis not present

## 2015-10-18 DIAGNOSIS — E1121 Type 2 diabetes mellitus with diabetic nephropathy: Secondary | ICD-10-CM | POA: Diagnosis not present

## 2015-12-22 DIAGNOSIS — R197 Diarrhea, unspecified: Secondary | ICD-10-CM | POA: Diagnosis not present

## 2015-12-27 DIAGNOSIS — I120 Hypertensive chronic kidney disease with stage 5 chronic kidney disease or end stage renal disease: Secondary | ICD-10-CM | POA: Diagnosis not present

## 2015-12-27 DIAGNOSIS — Z940 Kidney transplant status: Secondary | ICD-10-CM | POA: Diagnosis not present

## 2015-12-27 DIAGNOSIS — Z4822 Encounter for aftercare following kidney transplant: Secondary | ICD-10-CM | POA: Diagnosis not present

## 2015-12-27 DIAGNOSIS — Z794 Long term (current) use of insulin: Secondary | ICD-10-CM | POA: Diagnosis not present

## 2015-12-27 DIAGNOSIS — Z79899 Other long term (current) drug therapy: Secondary | ICD-10-CM | POA: Diagnosis not present

## 2015-12-27 DIAGNOSIS — D8989 Other specified disorders involving the immune mechanism, not elsewhere classified: Secondary | ICD-10-CM | POA: Diagnosis not present

## 2015-12-27 DIAGNOSIS — N186 End stage renal disease: Secondary | ICD-10-CM | POA: Diagnosis not present

## 2015-12-27 DIAGNOSIS — E1122 Type 2 diabetes mellitus with diabetic chronic kidney disease: Secondary | ICD-10-CM | POA: Diagnosis not present

## 2015-12-27 DIAGNOSIS — E785 Hyperlipidemia, unspecified: Secondary | ICD-10-CM | POA: Diagnosis not present

## 2015-12-27 DIAGNOSIS — D899 Disorder involving the immune mechanism, unspecified: Secondary | ICD-10-CM | POA: Diagnosis not present

## 2015-12-28 DIAGNOSIS — E113292 Type 2 diabetes mellitus with mild nonproliferative diabetic retinopathy without macular edema, left eye: Secondary | ICD-10-CM | POA: Diagnosis not present

## 2015-12-28 DIAGNOSIS — Z961 Presence of intraocular lens: Secondary | ICD-10-CM | POA: Diagnosis not present

## 2015-12-28 DIAGNOSIS — H35371 Puckering of macula, right eye: Secondary | ICD-10-CM | POA: Diagnosis not present

## 2015-12-28 DIAGNOSIS — E113311 Type 2 diabetes mellitus with moderate nonproliferative diabetic retinopathy with macular edema, right eye: Secondary | ICD-10-CM | POA: Diagnosis not present

## 2016-01-01 DIAGNOSIS — N5231 Erectile dysfunction following radical prostatectomy: Secondary | ICD-10-CM | POA: Diagnosis not present

## 2016-01-01 DIAGNOSIS — C61 Malignant neoplasm of prostate: Secondary | ICD-10-CM | POA: Diagnosis not present

## 2016-01-01 DIAGNOSIS — Z9489 Other transplanted organ and tissue status: Secondary | ICD-10-CM | POA: Diagnosis not present

## 2016-01-03 DIAGNOSIS — Z940 Kidney transplant status: Secondary | ICD-10-CM | POA: Diagnosis not present

## 2016-01-05 DIAGNOSIS — L570 Actinic keratosis: Secondary | ICD-10-CM | POA: Diagnosis not present

## 2016-01-05 DIAGNOSIS — D225 Melanocytic nevi of trunk: Secondary | ICD-10-CM | POA: Diagnosis not present

## 2016-01-05 DIAGNOSIS — D1801 Hemangioma of skin and subcutaneous tissue: Secondary | ICD-10-CM | POA: Diagnosis not present

## 2016-01-05 DIAGNOSIS — C4441 Basal cell carcinoma of skin of scalp and neck: Secondary | ICD-10-CM | POA: Diagnosis not present

## 2016-01-05 DIAGNOSIS — Z85828 Personal history of other malignant neoplasm of skin: Secondary | ICD-10-CM | POA: Diagnosis not present

## 2016-01-09 ENCOUNTER — Ambulatory Visit (INDEPENDENT_AMBULATORY_CARE_PROVIDER_SITE_OTHER): Payer: Medicare Other | Admitting: Pulmonary Disease

## 2016-01-09 ENCOUNTER — Encounter: Payer: Self-pay | Admitting: Pulmonary Disease

## 2016-01-09 VITALS — BP 122/72 | HR 88 | Ht 69.0 in | Wt 213.2 lb

## 2016-01-09 DIAGNOSIS — G4733 Obstructive sleep apnea (adult) (pediatric): Secondary | ICD-10-CM

## 2016-01-09 DIAGNOSIS — J9611 Chronic respiratory failure with hypoxia: Secondary | ICD-10-CM

## 2016-01-09 NOTE — Patient Instructions (Signed)
Check ONO on bipap & we will advise about oxygen

## 2016-01-09 NOTE — Assessment & Plan Note (Signed)
Check ONO on bipap & we will advise about oxygen Can stop if minimal desatn - suspect REM related

## 2016-01-09 NOTE — Assessment & Plan Note (Signed)
Ct bipap 12/5  Weight loss encouraged, compliance with goal of at least 4-6 hrs every night is the expectation. Advised against medications with sedative side effects Cautioned against driving when sleepy - understanding that sleepiness will vary on a day to day basis

## 2016-01-09 NOTE — Progress Notes (Signed)
   Subjective:    Patient ID: Russell Bowers, male    DOB: 06/24/1951, 65 y.o.   MRN: [id number]  HPI  65 year old ex-smoker, renal transplant recipient with mod OSA, diaphragmatic paralysis and hypercarbic respiratory failure. He is maintained on noct Bipap 12/5 He quit smoking 1994 (20-pack-year)  Significant tests/ events  Polysomnogram at [facility] in 02/2012 showed moderate obstructive sleep apnea with AHI of 25 events per hour, lowest desaturation of 71%. Unfortunately he did not tolerate CPAP on a subsequent titration study.  -admitted 03/2013 with acute hypercarbic resp failure in setting H Flu HCAP (similar recent admit February at [facility]) and likely decompensated OSA. Required mechanical ventilation Course c/b acute on chronic renal insufficiency, poor glycemic control and back abscess with hx peptostreptococcus.   PFTs showed severe restriction with a ratio 77, FVC 1.35-32%, FEV1 1.04-32% and TLC of 3.0 L-46% with DLCO at 52% that corrects for alveolar volumes suggestive of extra parenchymal restriction. Sniff test showed minimal mobility of diaphragms.   Nocturnal oximetry showed severe desaturation on 2 L of oxygen with a pattern suggestive hypoventilation, he spent 393 minutes with sats less than 88%.   BiPAP titration- 218 pounds- 13/9 centimeters with a medium fullface mask with 2 liters of oxygen blended  >>04/2014 could not tolerate high pressure, hence changed to autobipap  Downloads reviewed on bipap 13/9 & auto >> no residuals, no leak, good usage   Download 12/11/14 on bipap 12/5 >> no residuals , good usage  7m Download 04/2015 on 12/5 >> no residuals, no leak, good usage  01/09/2016  Chief Complaint  Patient presents with  . Follow-up    Pt states that his breathing is doing well and is maintaining normal O2 levels. Pt states that he uses his CPAP nightly for about 7 hours. Pt denied daytime somnolence.    ONO 04/2015 - desatn ? REM related - asked to stay on  O2 - he has stopped using x 2 mnths - doe snot think he needs this  Download on bipap 12/5 - good usage, no leak, no residuals  Did not like high pressure when he was on 15/5 Mask ok, pr ok Wanted to switch DME due to poor service but agrees to stay  On lasix & immnuosuppressants  Review of Systems neg for any significant sore throat, dysphagia, itching, sneezing, nasal congestion or excess/ purulent secretions, fever, chills, sweats, unintended wt loss, pleuritic or exertional cp, hempoptysis, orthopnea pnd or change in chronic leg swelling. Also denies presyncope, palpitations, heartburn, abdominal pain, nausea, vomiting, diarrhea or change in bowel or urinary habits, dysuria,hematuria, rash, arthralgias, visual complaints, headache, numbness weakness or ataxia.     Objective:   Physical Exam  Gen. Pleasant, well-nourished, in no distress ENT - no lesions, no post nasal drip Neck: No JVD, no thyromegaly, no carotid bruits Lungs: no use of accessory muscles, no dullness to percussion, clear without rales or rhonchi  Cardiovascular: Rhythm regular, heart sounds  normal, no murmurs or gallops, no peripheral edema Musculoskeletal: No deformities, no cyanosis or clubbing        Assessment & Plan:

## 2016-01-10 DIAGNOSIS — N390 Urinary tract infection, site not specified: Secondary | ICD-10-CM | POA: Diagnosis not present

## 2016-01-15 DIAGNOSIS — E782 Mixed hyperlipidemia: Secondary | ICD-10-CM | POA: Diagnosis not present

## 2016-01-15 DIAGNOSIS — I129 Hypertensive chronic kidney disease with stage 1 through stage 4 chronic kidney disease, or unspecified chronic kidney disease: Secondary | ICD-10-CM | POA: Diagnosis not present

## 2016-01-15 DIAGNOSIS — M10379 Gout due to renal impairment, unspecified ankle and foot: Secondary | ICD-10-CM | POA: Diagnosis not present

## 2016-01-15 DIAGNOSIS — N183 Chronic kidney disease, stage 3 (moderate): Secondary | ICD-10-CM | POA: Diagnosis not present

## 2016-01-15 DIAGNOSIS — E118 Type 2 diabetes mellitus with unspecified complications: Secondary | ICD-10-CM | POA: Diagnosis not present

## 2016-01-15 DIAGNOSIS — C61 Malignant neoplasm of prostate: Secondary | ICD-10-CM | POA: Diagnosis not present

## 2016-01-15 DIAGNOSIS — N2581 Secondary hyperparathyroidism of renal origin: Secondary | ICD-10-CM | POA: Diagnosis not present

## 2016-01-15 DIAGNOSIS — D631 Anemia in chronic kidney disease: Secondary | ICD-10-CM | POA: Diagnosis not present

## 2016-01-15 DIAGNOSIS — Z940 Kidney transplant status: Secondary | ICD-10-CM | POA: Diagnosis not present

## 2016-01-15 DIAGNOSIS — G4733 Obstructive sleep apnea (adult) (pediatric): Secondary | ICD-10-CM | POA: Diagnosis not present

## 2016-01-15 DIAGNOSIS — E872 Acidosis: Secondary | ICD-10-CM | POA: Diagnosis not present

## 2016-01-18 ENCOUNTER — Encounter: Payer: Self-pay | Admitting: Pulmonary Disease

## 2016-01-23 ENCOUNTER — Telehealth: Payer: Self-pay | Admitting: Pulmonary Disease

## 2016-01-23 DIAGNOSIS — G4733 Obstructive sleep apnea (adult) (pediatric): Secondary | ICD-10-CM

## 2016-01-23 NOTE — Telephone Encounter (Signed)
Per 01/09/16 ov: Patient Instructions      Check ONO on bipap & we will advise about oxygen  --  Called spoke with pt. He was never contacted about ONO to be done. Does not look like order was ever placed. Pt is requesting to just go ahead and d/c the O2 concentrator bc he never uses it. Please advise RA thanks

## 2016-01-23 NOTE — Telephone Encounter (Signed)
Called spoke with pt. Aware of below. Order placed to d/c O2 and ono order on BIPAP. Nothing further needed

## 2016-01-23 NOTE — Telephone Encounter (Signed)
Ok to dc Pl check ONO on bipap

## 2016-01-24 DIAGNOSIS — Z940 Kidney transplant status: Secondary | ICD-10-CM | POA: Diagnosis not present

## 2016-01-26 DIAGNOSIS — H43813 Vitreous degeneration, bilateral: Secondary | ICD-10-CM | POA: Diagnosis not present

## 2016-01-26 DIAGNOSIS — H3581 Retinal edema: Secondary | ICD-10-CM | POA: Diagnosis not present

## 2016-01-26 DIAGNOSIS — E113293 Type 2 diabetes mellitus with mild nonproliferative diabetic retinopathy without macular edema, bilateral: Secondary | ICD-10-CM | POA: Diagnosis not present

## 2016-01-26 DIAGNOSIS — H35371 Puckering of macula, right eye: Secondary | ICD-10-CM | POA: Diagnosis not present

## 2016-03-13 DIAGNOSIS — R197 Diarrhea, unspecified: Secondary | ICD-10-CM | POA: Diagnosis not present

## 2016-04-02 DIAGNOSIS — D1801 Hemangioma of skin and subcutaneous tissue: Secondary | ICD-10-CM | POA: Diagnosis not present

## 2016-04-02 DIAGNOSIS — Z85828 Personal history of other malignant neoplasm of skin: Secondary | ICD-10-CM | POA: Diagnosis not present

## 2016-04-10 DIAGNOSIS — N2581 Secondary hyperparathyroidism of renal origin: Secondary | ICD-10-CM | POA: Diagnosis not present

## 2016-04-10 DIAGNOSIS — Z940 Kidney transplant status: Secondary | ICD-10-CM | POA: Diagnosis not present

## 2016-04-10 DIAGNOSIS — E118 Type 2 diabetes mellitus with unspecified complications: Secondary | ICD-10-CM | POA: Diagnosis not present

## 2016-04-16 DIAGNOSIS — E872 Acidosis: Secondary | ICD-10-CM | POA: Diagnosis not present

## 2016-04-16 DIAGNOSIS — Z940 Kidney transplant status: Secondary | ICD-10-CM | POA: Diagnosis not present

## 2016-04-16 DIAGNOSIS — E118 Type 2 diabetes mellitus with unspecified complications: Secondary | ICD-10-CM | POA: Diagnosis not present

## 2016-04-16 DIAGNOSIS — G4733 Obstructive sleep apnea (adult) (pediatric): Secondary | ICD-10-CM | POA: Diagnosis not present

## 2016-04-16 DIAGNOSIS — M109 Gout, unspecified: Secondary | ICD-10-CM | POA: Diagnosis not present

## 2016-04-16 DIAGNOSIS — N2581 Secondary hyperparathyroidism of renal origin: Secondary | ICD-10-CM | POA: Diagnosis not present

## 2016-04-16 DIAGNOSIS — N183 Chronic kidney disease, stage 3 (moderate): Secondary | ICD-10-CM | POA: Diagnosis not present

## 2016-04-16 DIAGNOSIS — C61 Malignant neoplasm of prostate: Secondary | ICD-10-CM | POA: Diagnosis not present

## 2016-04-16 DIAGNOSIS — N390 Urinary tract infection, site not specified: Secondary | ICD-10-CM | POA: Diagnosis not present

## 2016-04-16 DIAGNOSIS — D631 Anemia in chronic kidney disease: Secondary | ICD-10-CM | POA: Diagnosis not present

## 2016-04-16 DIAGNOSIS — I129 Hypertensive chronic kidney disease with stage 1 through stage 4 chronic kidney disease, or unspecified chronic kidney disease: Secondary | ICD-10-CM | POA: Diagnosis not present

## 2016-04-16 DIAGNOSIS — E782 Mixed hyperlipidemia: Secondary | ICD-10-CM | POA: Diagnosis not present

## 2016-04-23 DIAGNOSIS — N390 Urinary tract infection, site not specified: Secondary | ICD-10-CM | POA: Diagnosis not present

## 2016-04-29 DIAGNOSIS — R197 Diarrhea, unspecified: Secondary | ICD-10-CM | POA: Diagnosis not present

## 2016-05-12 DIAGNOSIS — Z7984 Long term (current) use of oral hypoglycemic drugs: Secondary | ICD-10-CM | POA: Diagnosis not present

## 2016-05-12 DIAGNOSIS — E1165 Type 2 diabetes mellitus with hyperglycemia: Secondary | ICD-10-CM | POA: Diagnosis not present

## 2016-05-12 DIAGNOSIS — Z794 Long term (current) use of insulin: Secondary | ICD-10-CM | POA: Diagnosis not present

## 2016-05-12 DIAGNOSIS — E162 Hypoglycemia, unspecified: Secondary | ICD-10-CM | POA: Diagnosis not present

## 2016-05-17 DIAGNOSIS — E162 Hypoglycemia, unspecified: Secondary | ICD-10-CM | POA: Diagnosis not present

## 2016-05-22 DIAGNOSIS — Z940 Kidney transplant status: Secondary | ICD-10-CM | POA: Diagnosis not present

## 2016-05-22 DIAGNOSIS — N2581 Secondary hyperparathyroidism of renal origin: Secondary | ICD-10-CM | POA: Diagnosis not present

## 2016-06-05 DIAGNOSIS — H35371 Puckering of macula, right eye: Secondary | ICD-10-CM | POA: Diagnosis not present

## 2016-06-05 DIAGNOSIS — H3581 Retinal edema: Secondary | ICD-10-CM | POA: Diagnosis not present

## 2016-06-05 DIAGNOSIS — E113293 Type 2 diabetes mellitus with mild nonproliferative diabetic retinopathy without macular edema, bilateral: Secondary | ICD-10-CM | POA: Diagnosis not present

## 2016-06-05 DIAGNOSIS — H43813 Vitreous degeneration, bilateral: Secondary | ICD-10-CM | POA: Diagnosis not present

## 2016-07-01 DIAGNOSIS — Z8546 Personal history of malignant neoplasm of prostate: Secondary | ICD-10-CM | POA: Diagnosis not present

## 2016-07-01 DIAGNOSIS — C61 Malignant neoplasm of prostate: Secondary | ICD-10-CM | POA: Diagnosis not present

## 2016-07-01 DIAGNOSIS — N5231 Erectile dysfunction following radical prostatectomy: Secondary | ICD-10-CM | POA: Diagnosis not present

## 2016-07-01 DIAGNOSIS — Z9489 Other transplanted organ and tissue status: Secondary | ICD-10-CM | POA: Diagnosis not present

## 2016-07-02 DIAGNOSIS — N390 Urinary tract infection, site not specified: Secondary | ICD-10-CM | POA: Diagnosis not present

## 2016-07-02 DIAGNOSIS — Z940 Kidney transplant status: Secondary | ICD-10-CM | POA: Diagnosis not present

## 2016-07-02 DIAGNOSIS — E118 Type 2 diabetes mellitus with unspecified complications: Secondary | ICD-10-CM | POA: Diagnosis not present

## 2016-07-02 DIAGNOSIS — N2581 Secondary hyperparathyroidism of renal origin: Secondary | ICD-10-CM | POA: Diagnosis not present

## 2016-07-11 DIAGNOSIS — G4733 Obstructive sleep apnea (adult) (pediatric): Secondary | ICD-10-CM | POA: Diagnosis not present

## 2016-07-11 DIAGNOSIS — E782 Mixed hyperlipidemia: Secondary | ICD-10-CM | POA: Diagnosis not present

## 2016-07-11 DIAGNOSIS — M109 Gout, unspecified: Secondary | ICD-10-CM | POA: Diagnosis not present

## 2016-07-11 DIAGNOSIS — E872 Acidosis: Secondary | ICD-10-CM | POA: Diagnosis not present

## 2016-07-11 DIAGNOSIS — N390 Urinary tract infection, site not specified: Secondary | ICD-10-CM | POA: Diagnosis not present

## 2016-07-11 DIAGNOSIS — N183 Chronic kidney disease, stage 3 (moderate): Secondary | ICD-10-CM | POA: Diagnosis not present

## 2016-07-11 DIAGNOSIS — E118 Type 2 diabetes mellitus with unspecified complications: Secondary | ICD-10-CM | POA: Diagnosis not present

## 2016-07-11 DIAGNOSIS — D631 Anemia in chronic kidney disease: Secondary | ICD-10-CM | POA: Diagnosis not present

## 2016-07-11 DIAGNOSIS — I129 Hypertensive chronic kidney disease with stage 1 through stage 4 chronic kidney disease, or unspecified chronic kidney disease: Secondary | ICD-10-CM | POA: Diagnosis not present

## 2016-07-11 DIAGNOSIS — N2581 Secondary hyperparathyroidism of renal origin: Secondary | ICD-10-CM | POA: Diagnosis not present

## 2016-07-11 DIAGNOSIS — Z940 Kidney transplant status: Secondary | ICD-10-CM | POA: Diagnosis not present

## 2016-07-23 DIAGNOSIS — R197 Diarrhea, unspecified: Secondary | ICD-10-CM | POA: Diagnosis not present

## 2016-10-04 DIAGNOSIS — Z940 Kidney transplant status: Secondary | ICD-10-CM | POA: Diagnosis not present

## 2016-10-04 DIAGNOSIS — N2581 Secondary hyperparathyroidism of renal origin: Secondary | ICD-10-CM | POA: Diagnosis not present

## 2016-10-04 DIAGNOSIS — E118 Type 2 diabetes mellitus with unspecified complications: Secondary | ICD-10-CM | POA: Diagnosis not present

## 2016-11-08 ENCOUNTER — Other Ambulatory Visit: Payer: Self-pay | Admitting: Gastroenterology

## 2016-11-08 DIAGNOSIS — R634 Abnormal weight loss: Secondary | ICD-10-CM

## 2016-11-19 ENCOUNTER — Ambulatory Visit
Admission: RE | Admit: 2016-11-19 | Discharge: 2016-11-19 | Disposition: A | Discharge status: Home / Self Care | Payer: Medicare Other | Source: Ambulatory Visit | Attending: Gastroenterology | Admitting: Gastroenterology

## 2016-11-19 DIAGNOSIS — R634 Abnormal weight loss: Secondary | ICD-10-CM

## 2016-11-19 MED ORDER — IOPAMIDOL (ISOVUE-300) INJECTION 61%
100.0000 mL | Freq: Once | INTRAVENOUS | Status: AC | PRN
  Administered 2016-11-19: 100 mL via INTRAVENOUS

## 2017-01-16 ENCOUNTER — Telehealth: Payer: Self-pay | Admitting: Pulmonary Disease

## 2017-01-16 DIAGNOSIS — G4733 Obstructive sleep apnea (adult) (pediatric): Secondary | ICD-10-CM

## 2017-01-16 NOTE — Telephone Encounter (Signed)
Russell Bowers did you happen to have a DME for this pt for any bipap supplies?  The pt is calling stating that the order that was sent in was not signed and needs to be re-sent.  Please advise. thanks

## 2017-01-21 NOTE — Telephone Encounter (Signed)
Spoke with pt. Verified that he is needing new BiPAP supplies. DME order has been placed. Nothing further was needed.

## 2017-10-16 ENCOUNTER — Telehealth: Payer: Self-pay

## 2017-10-16 NOTE — Telephone Encounter (Signed)
SENT NOTES TO SCHEDULING 

## 2017-11-07 ENCOUNTER — Encounter: Payer: Self-pay | Admitting: Cardiology

## 2017-11-19 ENCOUNTER — Encounter (INDEPENDENT_AMBULATORY_CARE_PROVIDER_SITE_OTHER): Payer: Self-pay

## 2017-11-19 ENCOUNTER — Encounter: Payer: Self-pay | Admitting: Cardiology

## 2017-11-19 ENCOUNTER — Ambulatory Visit: Payer: Medicare Other | Admitting: Cardiology

## 2017-11-19 VITALS — BP 140/80 | HR 80 | Ht 69.0 in | Wt 176.4 lb

## 2017-11-19 DIAGNOSIS — Z940 Kidney transplant status: Secondary | ICD-10-CM

## 2017-11-19 DIAGNOSIS — G4733 Obstructive sleep apnea (adult) (pediatric): Secondary | ICD-10-CM | POA: Diagnosis not present

## 2017-11-19 DIAGNOSIS — I350 Nonrheumatic aortic (valve) stenosis: Secondary | ICD-10-CM | POA: Diagnosis not present

## 2017-11-19 NOTE — Patient Instructions (Addendum)
Medication Instructions:  The current medical regimen is effective;  continue present plan and medications.  Testing/Procedures: Your physician has requested that you have an echocardiogram. Echocardiography is a painless test that uses sound waves to create images of your heart. It provides your doctor with information about the size and shape of your heart and how well your heart's chambers and valves are working. This procedure takes approximately one hour. There are no restrictions for this procedure.  Follow-Up: Follow up in 3 years with Dr. Marlou Porch.  You will receive a letter in the mail 2 months before you are due.  Please call us when you receive this letter to schedule your follow up appointment.  If you need a refill on your cardiac medications before your next appointment, please call your pharmacy.  Thank you for choosing [facility]!!

## 2017-11-19 NOTE — Progress Notes (Signed)
[facility]. [address], [address], [place]  [address] Phone: [phone] Fax:  [phone]  Date:  11/19/2017   ID:  Russell Bowers, DOB May 11, 1951, MRN [id number]  PCP:  Hulan Fess, MD  Pulmonary: Elsworth Soho   History of Present Illness: Russell Bowers is a 66 y.o. male hypercarbic respiratory failure, who had transient elevation in troponin during respiratory failure. comorbidities include renal transplantation here for evaluation of aortic stenosis which is previously been categorized as mild. Echocardiogram 03/2013 demonstrated normal ejection fraction of 60% with no wall motion abnormality, mild aortic stenosis which is chronic. Right bundle branch block noted, chronic. He did undergo stress test in September of 2012 which was low risk with no evidence of ischemia, normal EF.  After his kidney transplant, maintaining fluid balance very well. He weighs himself daily at home. Blood pressure has been well controlled. He is on Lasix as directed by nephrology, Dr. Jimmy Footman.  In fact, after changing his antirejection drug over to Imuran, his diarrhea ceased.  Dr. Amedeo Plenty, his GI physician also worked on his gastric motility.  OSA - doing well, weight loss 40 pounds. GI issues resolved.  Doing very well.  No chest pain shortness of breath fevers chills syncope bleeding orthopnea.  He feels like a new man since losing 40 pounds.   Wt Readings from Last 3 Encounters:  11/19/17 176 lb 6.4 oz (80 kg)  01/09/16 213 lb 3.2 oz (96.7 kg)  05/16/15 217 lb (98.4 kg)     Past Medical History:  Diagnosis Date  . ADHD (attention deficit hyperactivity disorder)   . Allergic rhinitis   . Cancer [facility])    Prostate cancer  . Cellulitis and abscess of trunk 2014   history of back abscess  . Cellulitis and abscess of unspecified site 2014   lower back  . Detached retina   . Diabetes mellitus without complication ([place])   . Elevated troponin   . Fistula    OF LEFT ARM  . Gout   . H/O  kidney transplant   . History of congestive heart failure    FOLLOWED BY DR. Marlou Porch   . History of recurrent pneumonia   . 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 Melvyn Novas  . Hypertension   . Mild concentric left ventricular hypertrophy (LVH)    AND AORTIC STENOSIS FOLLOWED IN THE PAST BY CARDIOLOGIST DR. Marlou Porch.  . OSA treated with BiPAP   . Personal history of colonic polyps   . Prostate cancer ([place]) 11/2010   PROSTATECTOMY WITH UROLOGIST DR. DAVIS  . Renal failure   . Shortness of breath   . Sleep disorder breathing    FOLLOWED BY PULMONOLOGIST DR. Elsworth Soho    Past Surgical History:  Procedure Laterality Date  . CATARACT EXTRACTION, BILATERAL  11/2014  . COLONOSCOPY  09/2014  . KIDNEY TRANSPLANT  12/01/2011  . PROSTATECTOMY     FOR PROSTATE CANCER  . prostectomy  11/2010  . RETINAL DETACHMENT SURGERY  06/2002    Current Outpatient Medications  Medication Sig Dispense Refill  . acetaminophen (TYLENOL) 500 MG tablet Take 1,000 mg by mouth every 6 (six) hours as needed for moderate pain.    [name] allopurinol (ZYLOPRIM) 300 MG tablet Take 300 mg by mouth daily.     [name] azaTHIOprine (IMURAN) 50 MG tablet Take 150 mg daily by mouth.    . calcitRIOL (ROCALTROL) 0.25 MCG capsule Take 0.5  mcg by mouth 3 (three) times daily.     . cinacalcet (SENSIPAR) 30 MG tablet Take 30 mg by mouth daily with breakfast.     . furosemide (LASIX) 80 MG tablet Take 80 mg by mouth daily.     [name] glipiZIDE (GLUCOTROL XL) 10 MG 24 hr tablet Take 10 mg by mouth 2 (two) times daily.     . hyoscyamine (LEVSIN, ANASPAZ) 0.125 MG tablet Take 0.375 mg by mouth.    . insulin glargine (LANTUS) 100 UNIT/ML injection Inject 15 Units into the skin at bedtime.    . insulin regular (NOVOLIN R,HUMULIN R) 100 units/mL injection Inject 0-8 Units into the skin 3 (three) times daily as needed for high blood sugar (per sliding scale).     [name]  loperamide (IMODIUM) 2 MG capsule Take 2 mg by mouth as needed for diarrhea or loose stools.    . Loperamide-Simethicone (IMODIUM MULTI-SYMPTOM RELIEF) 2-125 MG TABS Take 1 tablet daily as needed by mouth.    . loratadine (CLARITIN) 10 MG tablet Take 10 mg daily by mouth.    . Magnesium Cl-Calcium Carbonate (SLOW-MAG PO) Take 2 tablets by mouth daily.    . metoprolol succinate (TOPROL-XL) 50 MG 24 hr tablet Take 0.5 tablets by mouth daily.     . mycophenolate (MYFORTIC) 180 MG EC tablet Take 360 mg by mouth 2 (two) times daily.    . pravastatin (PRAVACHOL) 80 MG tablet Take 80 mg by mouth at bedtime.     . ranitidine (ZANTAC) 150 MG capsule Take 150 mg by mouth 2 (two) times daily.    . sodium chloride (OCEAN) 0.65 % nasal spray Place 3-4 sprays into the nose at bedtime.    . tacrolimus (PROGRAF) 1 MG capsule Take 1 mg by mouth 2 (two) times daily.      No current facility-administered medications for this visit.     Allergies:    Allergies  Allergen Reactions  . Pollen Extract Other (See Comments)    stuffy nose    Social History:  The patient  reports that he quit smoking about 24 years ago. His smoking use included cigarettes. He has a 21.00 pack-year smoking history. he has never used smokeless tobacco. He reports that he drinks alcohol. He reports that he does not use drugs.   FHX: Mother with AFIB, no early CAD.  ROS:  Please see the history of present illness.   No recent edema, no orthopnea, no chest pain  PHYSICAL EXAM: VS:  BP 140/80   Pulse 80   Ht 5\' 9"  (1.753 m)   Wt 176 lb 6.4 oz (80 kg)   SpO2 96%   BMI 26.05 kg/m  GEN: Well nourished, well developed, in no acute distress  HEENT: normal  Neck: no JVD, carotid bruits, or masses Cardiac: RRR; 1/6 SM, no rubs, or gallops,no edema  Respiratory:  clear to auscultation bilaterally, normal work of breathing GI: soft, nontender, nondistended, + BS MS: no deformity or atrophy  Skin: warm and dry, no rash Neuro:  Alert  and Oriented x 3, Strength and sensation are intact Psych: euthymic mood, full affect   ECHO: 04/12/13: - Left ventricle: The cavity size was normal. There was mild focal basal hypertrophy of the septum. Systolic function was normal. The estimated ejection fraction was in the range of 60% to 65%. Although no diagnostic regional wall motion abnormality was identified, this possibility cannot be completely excluded on the basis of this study. - Aortic valve: There  was mild stenosis. Trivial regurgitation. Valve area: 2.32cm^2(VTI). Valve area: 1.54cm^2 (Vmax). - Mitral valve: Calcified annulus. Mildly calcified leaflets . Mild regurgitation. - Left atrium: The atrium was mildly dilated.  EKG: 11/19/17 shows sinus rhythm 80 with no other significant abnormalities.  Labs: 10/16/17 :total cholesterol 143, HDL 44, LDL 83, triglycerides 81, hemoglobin A1c 7.6, hemoglobin 12.3, creatinine 2.0, potassium 5.2, magnesium 1.8, ALT 14, TSH 1.1  ASSESSMENT AND PLAN:  Previous episode of diastolic heart failure in the setting of hypercarbic respiratory failure and pneumonia-currently doing well, no symptoms. Continue to maintain fluid balance with furosemide as directed by nephrology. No major changes. Prior stress test reassuring. Normal EF.  Diabetes-per Dr. Rex Kras team  Mild AS - echo reviewed. Watching clinically. Could be from calcification during dialysis.  No obvious evidence of bicuspid valve. We will check echocardiogram since it is been several years.  Status post kidney transplant-nephrology. Overall doing well.  Right bundle branch block-previously seen however recent ECG does not show right bundle branch block.  Great job with weight loss.  Obstructive sleep apnea-doing very well on CPAP.  Actually states he enjoys it.  One-year follow-up unless aortic stenosis remains mild, we will continue with a 3-year follow-up.  Signed, Candee Furbish, MD [facility]  11/19/2017  2:01 PM

## 2017-11-25 ENCOUNTER — Other Ambulatory Visit: Payer: Self-pay

## 2017-11-25 ENCOUNTER — Ambulatory Visit (HOSPITAL_COMMUNITY): Payer: Medicare Other | Attending: Cardiology

## 2017-11-25 DIAGNOSIS — G4733 Obstructive sleep apnea (adult) (pediatric): Secondary | ICD-10-CM | POA: Insufficient documentation

## 2017-11-25 DIAGNOSIS — I352 Nonrheumatic aortic (valve) stenosis with insufficiency: Secondary | ICD-10-CM | POA: Insufficient documentation

## 2017-11-25 DIAGNOSIS — E119 Type 2 diabetes mellitus without complications: Secondary | ICD-10-CM | POA: Insufficient documentation

## 2017-11-25 DIAGNOSIS — Z8701 Personal history of pneumonia (recurrent): Secondary | ICD-10-CM | POA: Diagnosis not present

## 2017-11-25 DIAGNOSIS — I350 Nonrheumatic aortic (valve) stenosis: Secondary | ICD-10-CM

## 2017-11-25 DIAGNOSIS — Z940 Kidney transplant status: Secondary | ICD-10-CM | POA: Insufficient documentation

## 2017-11-25 DIAGNOSIS — I509 Heart failure, unspecified: Secondary | ICD-10-CM | POA: Diagnosis not present

## 2017-11-26 ENCOUNTER — Telehealth: Payer: Self-pay | Admitting: Cardiology

## 2017-11-26 NOTE — Telephone Encounter (Signed)
Walk In pt Form-Med List Dropped off By pt placed in [employer].

## 2017-12-08 ENCOUNTER — Telehealth: Payer: Self-pay | Admitting: *Deleted

## 2017-12-08 NOTE — Telephone Encounter (Signed)
Pt dropped off a medication list to be updated.  This has been completed.

## 2018-04-02 ENCOUNTER — Telehealth: Payer: Self-pay | Admitting: Pulmonary Disease

## 2018-04-02 DIAGNOSIS — G4733 Obstructive sleep apnea (adult) (pediatric): Secondary | ICD-10-CM

## 2018-04-02 NOTE — Telephone Encounter (Signed)
Spoke with pt, he states he thinks his pressure is too high and wants to change his pressure. I will place download in RA folder to review.

## 2018-04-06 NOTE — Telephone Encounter (Signed)
Dr. Elsworth Soho please advise on download results. Thanks.

## 2018-04-06 NOTE — Telephone Encounter (Signed)
Spoke with patient. He is aware that RA has approved the change. Order has been sent to [facility]. Nothing else needed at time of call.

## 2018-04-06 NOTE — Telephone Encounter (Signed)
Reviewed download, events well-controlled on 12/5, no residuals and excellent compliance Okay to decrease to 10/5 with repeat download in 1 month

## 2019-02-11 ENCOUNTER — Telehealth: Payer: Self-pay | Admitting: Pulmonary Disease

## 2019-02-11 DIAGNOSIS — G4733 Obstructive sleep apnea (adult) (pediatric): Secondary | ICD-10-CM

## 2019-02-11 NOTE — Telephone Encounter (Signed)
Called and spoke with patient regarding in need of his bipap supplies and new mask of choice Advised pt that he has not been seen by RA since 2017 and needs a f/u appt Scheduled appt with RA for Friday 02/19/2019 at 12:00pm only appt available. Pt is needing ov with RA in order to get bipap machine supplies and new mask with [facility] Placed order today to [facility] for bipap supplies and mask Nothing further needed at this time.

## 2019-02-19 ENCOUNTER — Ambulatory Visit: Payer: Medicare Other | Admitting: Pulmonary Disease

## 2019-02-19 ENCOUNTER — Encounter: Payer: Self-pay | Admitting: Pulmonary Disease

## 2019-02-19 ENCOUNTER — Ambulatory Visit (INDEPENDENT_AMBULATORY_CARE_PROVIDER_SITE_OTHER)
Admission: RE | Admit: 2019-02-19 | Discharge: 2019-02-19 | Disposition: A | Discharge status: Home / Self Care | Payer: Medicare Other | Source: Ambulatory Visit | Attending: Pulmonary Disease | Admitting: Pulmonary Disease

## 2019-02-19 VITALS — BP 128/78 | HR 84 | Ht 68.0 in | Wt 192.8 lb

## 2019-02-19 DIAGNOSIS — J9811 Atelectasis: Secondary | ICD-10-CM

## 2019-02-19 DIAGNOSIS — G4733 Obstructive sleep apnea (adult) (pediatric): Secondary | ICD-10-CM | POA: Diagnosis not present

## 2019-02-19 NOTE — Assessment & Plan Note (Signed)
Oxygen has been discontinued and since he is asymptomatic and will not repeat ONO He does have bilateral diaphragmatic weakness but since he is doing very well do not feel the need to repeat sniff test

## 2019-02-19 NOTE — Assessment & Plan Note (Signed)
BL band like noted on CT  2015 This seems to have persisted on his follow-up chest x-ray, last 03/2018.  And probably accounts for the right lower lobe crackles Here today. Repeat chest x-ray today

## 2019-02-19 NOTE — Progress Notes (Signed)
Subjective:    Patient ID: Russell Bowers, male    DOB: 27-Apr-1951, 68 y.o.   MRN: [id number]  HPI  68 yo ex-smoker, renal transplant recipient with mod OSA, diaphragmatic paralysis and episode of hypercarbic respiratory failure in 2014 At that time he was diagnosed with diaphragmatic paralysis and moderate OSA and has been maintained on nocturnal BiPAP since then, settings changed to 10/5 He quit smoking 1994 (20-pack-year)  Serial nocturnal oximetry continued to show oxygen desaturations but he finally quit using oxygen after his last visit in 2017.  He now returns reestablish care after 3 years.  He needs new supplies and wonders if his machine needs to be replaced.  His machine is working quite well. He has settled with a full facemask and is comfortable on this, denies any problems with pressure. He has lost 21 pounds to his current weight of 192 pounds over the last 3 years. Download was reviewed which shows excellent control of events on 10/5 cm with good compliance and minimal leak  Review of his prior CT from 2015 shows bilateral lower lobe bandlike consolidation. Prior chest x-ray show low lung volumes and last chest x-ray from 03/2018 was reviewed which again shows platelike atelectasis at the right lung base  He is a retired Optometrist and now plays golf and says that he is feeling the best he has in years, denies dyspnea or frequent chest colds. He had abdominal issues which led to weight loss which is finally attributed to mycophenolate and this is now been changed to Imuran    Significant tests/ events  Polysomnogram at [facility] in 02/2012 showed moderate obstructive sleep apnea with AHI of 25 events per hour, lowest desaturation of 71%. Unfortunately he did not tolerate CPAP on a subsequent titration study.  -admitted 03/2013 with acute hypercarbic resp failure in setting H Flu HCAP (similar recent admit February at [facility]) and likely decompensated OSA. Required  mechanical ventilation Course c/b acute on chronic renal insufficiency, poor glycemic control and back abscess with hx peptostreptococcus.   PFTs 02/2014  showed severe restriction with a ratio 77, FVC 1.35-32%, FEV1 1.04-32% and TLC of 3.0 L-46% with DLCO at 52% that corrects for alveolar volumes suggestive of extra parenchymal restriction. Sniff test showed minimal mobility of diaphragms.   Nocturnal oximetry showed severe desaturation on 2 L of oxygen with a pattern suggestive hypoventilation, he spent 393 minutes with sats less than 88%.   BiPAP titration- 218 pounds- 13/9 centimeters with a medium fullface mask with 2 liters of oxygen blended  >>04/2014 could not tolerate high pressure, hence changed to autobipap, finally decreased to 10/5 cm  Past Medical History:  Diagnosis Date  . ADHD (attention deficit hyperactivity disorder)   . Allergic rhinitis   . Cancer [facility])    Prostate cancer  . Cellulitis and abscess of trunk 2014   history of back abscess  . Cellulitis and abscess of unspecified site 2014   lower back  . Detached retina   . Diabetes mellitus without complication ([place])   . Elevated troponin   . Fistula    OF LEFT ARM  . Gout   . H/O kidney transplant   . History of congestive heart failure    FOLLOWED BY DR. Marlou Porch   . History of recurrent pneumonia   . 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 Melvyn Novas  . Hypertension   . Mild concentric left ventricular hypertrophy (LVH)  AND AORTIC STENOSIS FOLLOWED IN THE PAST BY CARDIOLOGIST DR. Marlou Porch.  . OSA treated with BiPAP   . Personal history of colonic polyps   . Prostate cancer ([place]) 11/2010   PROSTATECTOMY WITH UROLOGIST DR. DAVIS  . Renal failure   . Shortness of breath   . Sleep disorder breathing    FOLLOWED BY PULMONOLOGIST DR. Elsworth Soho    Past Surgical History:  Procedure Laterality Date  . CATARACT EXTRACTION,  BILATERAL  11/2014  . COLONOSCOPY  09/2014  . KIDNEY TRANSPLANT  12/01/2011  . PROSTATECTOMY     FOR PROSTATE CANCER  . prostectomy  11/2010  . RETINAL DETACHMENT SURGERY  06/2002    Allergies  Allergen Reactions  . Pollen Extract Other (See Comments)    stuffy nose    Social History   Socioeconomic History  . Marital status: Married    Spouse name: Not on file  . Number of children: Not on file  . Years of education: Not on file  . Highest education level: Not on file  Occupational History  . Occupation: Retired  Scientific laboratory technician  . Financial resource strain: Not on file  . Food insecurity:    Worry: Not on file    Inability: Not on file  . Transportation needs:    Medical: Not on file    Non-medical: Not on file  Tobacco Use  . Smoking status: Former Smoker    Packs/day: 1.00    Years: 21.00    Pack years: 21.00    Types: Cigarettes    Last attempt to quit: 03/16/1993    Years since quitting: 25.9  . Smokeless tobacco: Never Used  Substance and Sexual Activity  . Alcohol use: Yes    Alcohol/week: 1.0 - 2.0 standard drinks    Types: 1 - 2 Glasses of wine per week  . Drug use: No  . Sexual activity: Not on file  Lifestyle  . Physical activity:    Days per week: Not on file    Minutes per session: Not on file  . Stress: Not on file  Relationships  . Social connections:    Talks on phone: Not on file    Gets together: Not on file    Attends religious service: Not on file    Active member of club or organization: Not on file    Attends meetings of clubs or organizations: Not on file    Relationship status: Not on file  . Intimate partner violence:    Fear of current or ex partner: Not on file    Emotionally abused: Not on file    Physically abused: Not on file    Forced sexual activity: Not on file  Other Topics Concern  . Not on file  Social History Narrative  . Not on file      Family History  Problem Relation Age of Onset  . Diabetes Mother   .  Hypertension Mother   . Atrial fibrillation Mother   . Alzheimer's disease Father   . Heart Problems Sister   . Heart disease Maternal Grandfather   . Heart disease Maternal Grandmother      Review of Systems  Constitutional: negative for anorexia, fevers and sweats  Eyes: negative for irritation, redness and visual disturbance  Ears, nose, mouth, throat, and face: negative for earaches, epistaxis, nasal congestion and sore throat  Respiratory: negative for cough, dyspnea on exertion, sputum and wheezing  Cardiovascular: negative for chest pain, dyspnea, lower extremity edema, orthopnea, palpitations  and syncope  Gastrointestinal: negative for abdominal pain, constipation, diarrhea, melena, nausea and vomiting  Genitourinary:negative for dysuria, frequency and hematuria  Hematologic/lymphatic: negative for bleeding, easy bruising and lymphadenopathy  Musculoskeletal:negative for arthralgias, muscle weakness and stiff joints  Neurological: negative for coordination problems, gait problems, headaches and weakness  Endocrine: negative for diabetic symptoms including polydipsia, polyuria and weight loss     Objective:   Physical Exam  Gen. Pleasant, well-nourished, in no distress, normal affect ENT - no pallor,icterus, no post nasal drip Neck: No JVD, no thyromegaly, no carotid bruits Lungs: no use of accessory muscles, no dullness to percussion, Rt basal rales no  rhonchi  Cardiovascular: Rhythm regular, heart sounds  normal, no murmurs or gallops, no peripheral edema Abdomen: soft and non-tender, no hepatosplenomegaly, BS normal. Musculoskeletal: No deformities, no cyanosis or clubbing Neuro:  alert, non focal       Assessment & Plan:

## 2019-02-19 NOTE — Assessment & Plan Note (Signed)
BiPAP supplies will be renewed for a year. Your settings are 10/5 and seems to be working well He is compliant and this is certainly helped improve his daytime somnolence and fatigue  Weight loss encouraged, compliance with goal of at least 4-6 hrs every night is the expectation. Advised against medications with sedative side effects Cautioned against driving when sleepy - understanding that sleepiness will vary on a day to day basis

## 2019-02-19 NOTE — Patient Instructions (Signed)
Chest x-ray today for atelectasis  BiPAP supplies will be renewed for a year. Your settings are 10/5 and seems to be working well

## 2019-02-23 ENCOUNTER — Telehealth: Payer: Self-pay | Admitting: Pulmonary Disease

## 2019-02-23 NOTE — Telephone Encounter (Signed)
Called that patient and advised him of results. Patient declined additional testing by HR chest CT at this time.

## 2020-02-03 ENCOUNTER — Ambulatory Visit: Payer: Medicare Other

## 2020-09-26 ENCOUNTER — Other Ambulatory Visit: Payer: Self-pay

## 2020-09-26 ENCOUNTER — Ambulatory Visit: Payer: Medicare Other | Admitting: Pulmonary Disease

## 2020-09-26 ENCOUNTER — Encounter: Payer: Self-pay | Admitting: Pulmonary Disease

## 2020-09-26 DIAGNOSIS — J9612 Chronic respiratory failure with hypercapnia: Secondary | ICD-10-CM | POA: Diagnosis not present

## 2020-09-26 DIAGNOSIS — G4733 Obstructive sleep apnea (adult) (pediatric): Secondary | ICD-10-CM

## 2020-09-26 DIAGNOSIS — J9611 Chronic respiratory failure with hypoxia: Secondary | ICD-10-CM | POA: Diagnosis not present

## 2020-09-26 DIAGNOSIS — Z23 Encounter for immunization: Secondary | ICD-10-CM | POA: Diagnosis not present

## 2020-09-26 NOTE — Assessment & Plan Note (Signed)
BiPAP download was reviewed which shows excellent control of events and good minute ventilation on 10/5, no leaks.  He is very compliant and this is certainly helped his daytime somnolence and fatigue. Prescription for new BiPAP 10/5 will be sent to DME Supplies will be renewed for a year   compliance with goal of at least 4-6 hrs every night is the expectation. Advised against medications with sedative side effects Cautioned against driving when sleepy - understanding that sleepiness will vary on a day to day basis

## 2020-09-26 NOTE — Progress Notes (Signed)
   Subjective:    Patient ID: Russell Bowers, male    DOB: 1950-12-24, 69 y.o.   MRN: [id number]  HPI  69 yo ex-smoker, renal transplant recipient for FU of  mod OSA, diaphragmatic paralysis  He had an episode of hypercarbic respiratory failure in 2014 At that time he was diagnosed with diaphragmatic paralysis and moderate OSA and is maintained on nocturnal BiPAP since then, settings changed to 10/5 He quit smoking 1994(20-pack-year)  Serial nocturnal oximetry  showed oxygen desaturations but he  quit using oxygen  in 2017  Chief Complaint  Patient presents with  . Follow-up    pt states needs new machine here for bipap compliance   Follow-up after 1-1/2 years.  He continues to be compliant with his BiPAP.  Has lost some weight, continues to be active, gets outdoors days golf. He is Covid vaccinated and has received his booster in August, being immunocompromised. Remains on Imuran and tacrolimus. No problems with CPAP mask or pressure. He requests a new machine.  Has no issues with dyspnea, cough or wheezing   Significant tests/ events reviewed  Polysomnogram at [facility] in 02/2012 showed moderate obstructive sleep apnea with AHI of 25 events per hour, lowest desaturation of 71%. Unfortunately he did not tolerate CPAP on a subsequent titration study.  -admitted 03/2013 with acute hypercarbic resp failure in setting H Flu HCAP (similar recent admit February at [facility]) and likely decompensated OSA. Required mechanical ventilation Course c/b acute on chronic renal insufficiency, poor glycemic control and back abscess with hx peptostreptococcus.   PFTs 02/2014  showed severe restriction with a ratio 77, FVC 1.35-32%, FEV1 1.04-32% and TLC of 3.0 L-46% with DLCO at 52% that corrects for alveolar volumes suggestive of extra parenchymal restriction. Sniff test showed minimal mobility of diaphragms.   Nocturnal oximetry showed severe desaturation on 2 L of oxygen with a pattern  suggestive hypoventilation, he spent 393 minutes with sats less than 88%.   BiPAP titration- 218 pounds- 13/9 centimeters with a medium fullface mask with 2 liters of oxygen blended  >>04/2014 could not tolerate high pressure, hence changed to autobipap, finally decreased to 10/5 cm  Review of Systems neg for any significant sore throat, dysphagia, itching, sneezing, nasal congestion or excess/ purulent secretions, fever, chills, sweats, unintended wt loss, pleuritic or exertional cp, hempoptysis, orthopnea pnd or change in chronic leg swelling. Also denies presyncope, palpitations, heartburn, abdominal pain, nausea, vomiting, diarrhea or change in bowel or urinary habits, dysuria,hematuria, rash, arthralgias, visual complaints, headache, numbness weakness or ataxia.     Objective:   Physical Exam  Gen. Pleasant, normal affect, in no distress ENT - no lesions, no post nasal drip Neck: No JVD, no thyromegaly, no carotid bruits Lungs: no use of accessory muscles, no dullness to percussion, decreased without rales or rhonchi  Cardiovascular: Rhythm regular, heart sounds  normal, no murmurs or gallops, no peripheral edema Musculoskeletal: No deformities, no cyanosis or clubbing , no tremors       Assessment & Plan:

## 2020-09-26 NOTE — Assessment & Plan Note (Signed)
This was related to diaphragmatic paralysis. I offered him repeat sniff testing to revisit this issue.  I do not think this will change management.  He wanted to defer at this time. He does need BiPAP since could not tolerate CPAP in the past

## 2020-09-26 NOTE — Patient Instructions (Signed)
Prescription for new BiPAP 10/5 will be sent to DME. Flu shot today

## 2021-01-17 DIAGNOSIS — L821 Other seborrheic keratosis: Secondary | ICD-10-CM | POA: Diagnosis not present

## 2021-01-17 DIAGNOSIS — Z85828 Personal history of other malignant neoplasm of skin: Secondary | ICD-10-CM | POA: Diagnosis not present

## 2021-01-17 DIAGNOSIS — C4442 Squamous cell carcinoma of skin of scalp and neck: Secondary | ICD-10-CM | POA: Diagnosis not present

## 2021-01-17 DIAGNOSIS — D1801 Hemangioma of skin and subcutaneous tissue: Secondary | ICD-10-CM | POA: Diagnosis not present

## 2021-01-17 DIAGNOSIS — D2262 Melanocytic nevi of left upper limb, including shoulder: Secondary | ICD-10-CM | POA: Diagnosis not present

## 2021-01-17 DIAGNOSIS — L814 Other melanin hyperpigmentation: Secondary | ICD-10-CM | POA: Diagnosis not present

## 2021-01-17 DIAGNOSIS — D225 Melanocytic nevi of trunk: Secondary | ICD-10-CM | POA: Diagnosis not present

## 2021-01-17 DIAGNOSIS — B029 Zoster without complications: Secondary | ICD-10-CM | POA: Diagnosis not present

## 2021-02-05 DIAGNOSIS — J9610 Chronic respiratory failure, unspecified whether with hypoxia or hypercapnia: Secondary | ICD-10-CM | POA: Diagnosis not present

## 2021-02-13 DIAGNOSIS — N183 Chronic kidney disease, stage 3 unspecified: Secondary | ICD-10-CM | POA: Diagnosis not present

## 2021-02-13 DIAGNOSIS — E7800 Pure hypercholesterolemia, unspecified: Secondary | ICD-10-CM | POA: Diagnosis not present

## 2021-02-13 DIAGNOSIS — E118 Type 2 diabetes mellitus with unspecified complications: Secondary | ICD-10-CM | POA: Diagnosis not present

## 2021-02-13 DIAGNOSIS — I129 Hypertensive chronic kidney disease with stage 1 through stage 4 chronic kidney disease, or unspecified chronic kidney disease: Secondary | ICD-10-CM | POA: Diagnosis not present

## 2021-02-13 DIAGNOSIS — I5020 Unspecified systolic (congestive) heart failure: Secondary | ICD-10-CM | POA: Diagnosis not present

## 2021-02-13 DIAGNOSIS — E1121 Type 2 diabetes mellitus with diabetic nephropathy: Secondary | ICD-10-CM | POA: Diagnosis not present

## 2021-02-13 DIAGNOSIS — E782 Mixed hyperlipidemia: Secondary | ICD-10-CM | POA: Diagnosis not present

## 2021-02-13 DIAGNOSIS — N2581 Secondary hyperparathyroidism of renal origin: Secondary | ICD-10-CM | POA: Diagnosis not present

## 2021-02-13 DIAGNOSIS — E1122 Type 2 diabetes mellitus with diabetic chronic kidney disease: Secondary | ICD-10-CM | POA: Diagnosis not present

## 2021-02-13 DIAGNOSIS — Z940 Kidney transplant status: Secondary | ICD-10-CM | POA: Diagnosis not present

## 2021-02-13 DIAGNOSIS — N390 Urinary tract infection, site not specified: Secondary | ICD-10-CM | POA: Diagnosis not present

## 2021-02-17 ENCOUNTER — Inpatient Hospital Stay (HOSPITAL_COMMUNITY)
Admission: EM | Admit: 2021-02-17 | Discharge: 2021-02-21 | DRG: 808 | Disposition: A | Discharge status: Home / Self Care | Payer: Medicare Other | Attending: Family Medicine | Admitting: Family Medicine

## 2021-02-17 ENCOUNTER — Other Ambulatory Visit: Payer: Self-pay

## 2021-02-17 ENCOUNTER — Emergency Department (HOSPITAL_COMMUNITY): Payer: Medicare Other

## 2021-02-17 ENCOUNTER — Encounter (HOSPITAL_COMMUNITY): Payer: Self-pay | Admitting: Emergency Medicine

## 2021-02-17 DIAGNOSIS — I129 Hypertensive chronic kidney disease with stage 1 through stage 4 chronic kidney disease, or unspecified chronic kidney disease: Secondary | ICD-10-CM | POA: Diagnosis not present

## 2021-02-17 DIAGNOSIS — Z7984 Long term (current) use of oral hypoglycemic drugs: Secondary | ICD-10-CM | POA: Diagnosis not present

## 2021-02-17 DIAGNOSIS — Z9079 Acquired absence of other genital organ(s): Secondary | ICD-10-CM

## 2021-02-17 DIAGNOSIS — D696 Thrombocytopenia, unspecified: Secondary | ICD-10-CM

## 2021-02-17 DIAGNOSIS — Z9842 Cataract extraction status, left eye: Secondary | ICD-10-CM

## 2021-02-17 DIAGNOSIS — N184 Chronic kidney disease, stage 4 (severe): Secondary | ICD-10-CM | POA: Diagnosis present

## 2021-02-17 DIAGNOSIS — Z820 Family history of epilepsy and other diseases of the nervous system: Secondary | ICD-10-CM

## 2021-02-17 DIAGNOSIS — E785 Hyperlipidemia, unspecified: Secondary | ICD-10-CM | POA: Diagnosis not present

## 2021-02-17 DIAGNOSIS — Z20822 Contact with and (suspected) exposure to covid-19: Secondary | ICD-10-CM | POA: Diagnosis not present

## 2021-02-17 DIAGNOSIS — Y830 Surgical operation with transplant of whole organ as the cause of abnormal reaction of the patient, or of later complication, without mention of misadventure at the time of the procedure: Secondary | ICD-10-CM | POA: Diagnosis present

## 2021-02-17 DIAGNOSIS — E1122 Type 2 diabetes mellitus with diabetic chronic kidney disease: Secondary | ICD-10-CM | POA: Diagnosis present

## 2021-02-17 DIAGNOSIS — N189 Chronic kidney disease, unspecified: Secondary | ICD-10-CM | POA: Diagnosis not present

## 2021-02-17 DIAGNOSIS — Z8701 Personal history of pneumonia (recurrent): Secondary | ICD-10-CM

## 2021-02-17 DIAGNOSIS — N179 Acute kidney failure, unspecified: Secondary | ICD-10-CM

## 2021-02-17 DIAGNOSIS — D61818 Other pancytopenia: Secondary | ICD-10-CM

## 2021-02-17 DIAGNOSIS — T8619 Other complication of kidney transplant: Secondary | ICD-10-CM | POA: Diagnosis not present

## 2021-02-17 DIAGNOSIS — G4733 Obstructive sleep apnea (adult) (pediatric): Secondary | ICD-10-CM | POA: Diagnosis not present

## 2021-02-17 DIAGNOSIS — I5032 Chronic diastolic (congestive) heart failure: Secondary | ICD-10-CM | POA: Diagnosis present

## 2021-02-17 DIAGNOSIS — R531 Weakness: Secondary | ICD-10-CM | POA: Diagnosis not present

## 2021-02-17 DIAGNOSIS — I351 Nonrheumatic aortic (valve) insufficiency: Secondary | ICD-10-CM | POA: Diagnosis not present

## 2021-02-17 DIAGNOSIS — N183 Chronic kidney disease, stage 3 unspecified: Secondary | ICD-10-CM | POA: Diagnosis not present

## 2021-02-17 DIAGNOSIS — D649 Anemia, unspecified: Secondary | ICD-10-CM | POA: Diagnosis not present

## 2021-02-17 DIAGNOSIS — N1832 Chronic kidney disease, stage 3b: Secondary | ICD-10-CM | POA: Diagnosis not present

## 2021-02-17 DIAGNOSIS — N170 Acute kidney failure with tubular necrosis: Secondary | ICD-10-CM | POA: Diagnosis present

## 2021-02-17 DIAGNOSIS — Z9841 Cataract extraction status, right eye: Secondary | ICD-10-CM | POA: Diagnosis not present

## 2021-02-17 DIAGNOSIS — I352 Nonrheumatic aortic (valve) stenosis with insufficiency: Secondary | ICD-10-CM | POA: Diagnosis not present

## 2021-02-17 DIAGNOSIS — Z79899 Other long term (current) drug therapy: Secondary | ICD-10-CM

## 2021-02-17 DIAGNOSIS — D631 Anemia in chronic kidney disease: Secondary | ICD-10-CM

## 2021-02-17 DIAGNOSIS — Z940 Kidney transplant status: Secondary | ICD-10-CM

## 2021-02-17 DIAGNOSIS — I130 Hypertensive heart and chronic kidney disease with heart failure and stage 1 through stage 4 chronic kidney disease, or unspecified chronic kidney disease: Secondary | ICD-10-CM | POA: Diagnosis not present

## 2021-02-17 DIAGNOSIS — E8889 Other specified metabolic disorders: Secondary | ICD-10-CM | POA: Diagnosis not present

## 2021-02-17 DIAGNOSIS — Z87891 Personal history of nicotine dependence: Secondary | ICD-10-CM

## 2021-02-17 DIAGNOSIS — R7989 Other specified abnormal findings of blood chemistry: Secondary | ICD-10-CM | POA: Diagnosis not present

## 2021-02-17 DIAGNOSIS — E1129 Type 2 diabetes mellitus with other diabetic kidney complication: Secondary | ICD-10-CM | POA: Diagnosis not present

## 2021-02-17 DIAGNOSIS — Z8546 Personal history of malignant neoplasm of prostate: Secondary | ICD-10-CM | POA: Diagnosis not present

## 2021-02-17 DIAGNOSIS — Z8719 Personal history of other diseases of the digestive system: Secondary | ICD-10-CM

## 2021-02-17 DIAGNOSIS — J9811 Atelectasis: Secondary | ICD-10-CM | POA: Diagnosis not present

## 2021-02-17 DIAGNOSIS — D619 Aplastic anemia, unspecified: Principal | ICD-10-CM | POA: Diagnosis present

## 2021-02-17 DIAGNOSIS — N250 Renal osteodystrophy: Secondary | ICD-10-CM | POA: Diagnosis not present

## 2021-02-17 DIAGNOSIS — Z794 Long term (current) use of insulin: Secondary | ICD-10-CM

## 2021-02-17 DIAGNOSIS — N2889 Other specified disorders of kidney and ureter: Secondary | ICD-10-CM

## 2021-02-17 DIAGNOSIS — R197 Diarrhea, unspecified: Secondary | ICD-10-CM | POA: Diagnosis not present

## 2021-02-17 DIAGNOSIS — N390 Urinary tract infection, site not specified: Secondary | ICD-10-CM | POA: Diagnosis present

## 2021-02-17 DIAGNOSIS — Z8249 Family history of ischemic heart disease and other diseases of the circulatory system: Secondary | ICD-10-CM

## 2021-02-17 DIAGNOSIS — Z833 Family history of diabetes mellitus: Secondary | ICD-10-CM

## 2021-02-17 DIAGNOSIS — I350 Nonrheumatic aortic (valve) stenosis: Secondary | ICD-10-CM | POA: Diagnosis not present

## 2021-02-17 DIAGNOSIS — I2699 Other pulmonary embolism without acute cor pulmonale: Secondary | ICD-10-CM | POA: Diagnosis not present

## 2021-02-17 DIAGNOSIS — R0602 Shortness of breath: Secondary | ICD-10-CM | POA: Diagnosis not present

## 2021-02-17 DIAGNOSIS — I509 Heart failure, unspecified: Secondary | ICD-10-CM | POA: Diagnosis not present

## 2021-02-17 LAB — PROTIME-INR
INR: 1.2 (ref 0.8–1.2)
Prothrombin Time: 14.7 seconds (ref 11.4–15.2)

## 2021-02-17 LAB — COMPREHENSIVE METABOLIC PANEL
ALT: 10 U/L (ref 0–44)
AST: 11 U/L — ABNORMAL LOW (ref 15–41)
Albumin: 3.1 g/dL — ABNORMAL LOW (ref 3.5–5.0)
Alkaline Phosphatase: 88 U/L (ref 38–126)
Anion gap: 7 (ref 5–15)
BUN: 66 mg/dL — ABNORMAL HIGH (ref 8–23)
CO2: 23 mmol/L (ref 22–32)
Calcium: 9.4 mg/dL (ref 8.9–10.3)
Chloride: 108 mmol/L (ref 98–111)
Creatinine, Ser: 3.39 mg/dL — ABNORMAL HIGH (ref 0.61–1.24)
GFR, Estimated: 19 mL/min — ABNORMAL LOW (ref >60)
Glucose, Bld: 218 mg/dL — ABNORMAL HIGH (ref 70–99)
Potassium: 5.1 mmol/L (ref 3.5–5.1)
Sodium: 138 mmol/L (ref 135–145)
Total Bilirubin: 0.4 mg/dL (ref 0.3–1.2)
Total Protein: 6.6 g/dL (ref 6.5–8.1)

## 2021-02-17 LAB — IRON AND TIBC
Iron: 209 ug/dL — ABNORMAL HIGH (ref 45–182)
Saturation Ratios: 88 % — ABNORMAL HIGH (ref 17.9–39.5)
TIBC: 238 ug/dL — ABNORMAL LOW (ref 250–450)
UIBC: 29 ug/dL

## 2021-02-17 LAB — CBC WITH DIFFERENTIAL/PLATELET
Abs Immature Granulocytes: 0.01 10*3/uL (ref 0.00–0.07)
Basophils Absolute: 0 10*3/uL (ref 0.0–0.1)
Basophils Relative: 1 %
Eosinophils Absolute: 0 10*3/uL (ref 0.0–0.5)
Eosinophils Relative: 1 %
HCT: 18.2 % — ABNORMAL LOW (ref 39.0–52.0)
Hemoglobin: 5.6 g/dL — CL (ref 13.0–17.0)
Immature Granulocytes: 1 %
Lymphocytes Relative: 7 %
Lymphs Abs: 0.1 10*3/uL — ABNORMAL LOW (ref 0.7–4.0)
MCH: 35.7 pg — ABNORMAL HIGH (ref 26.0–34.0)
MCHC: 30.8 g/dL (ref 30.0–36.0)
MCV: 115.9 fL — ABNORMAL HIGH (ref 80.0–100.0)
Monocytes Absolute: 0.1 10*3/uL (ref 0.1–1.0)
Monocytes Relative: 6 %
Neutro Abs: 1.5 10*3/uL — ABNORMAL LOW (ref 1.7–7.7)
Neutrophils Relative %: 84 %
Platelets: 121 10*3/uL — ABNORMAL LOW (ref 150–400)
RBC: 1.57 MIL/uL — ABNORMAL LOW (ref 4.22–5.81)
RDW: 14.1 % (ref 11.5–15.5)
WBC: 1.8 10*3/uL — ABNORMAL LOW (ref 4.0–10.5)
nRBC: 0 % (ref 0.0–0.2)

## 2021-02-17 LAB — FOLATE: Folate: 6.2 ng/mL (ref >5.9)

## 2021-02-17 LAB — RETICULOCYTES
Immature Retic Fract: 2.7 % (ref 2.3–15.9)
RBC.: 1.41 MIL/uL — ABNORMAL LOW (ref 4.22–5.81)
Retic Count, Absolute: 2.4 10*3/uL — ABNORMAL LOW (ref 19.0–186.0)
Retic Ct Pct: <0.4 % — ABNORMAL LOW (ref 0.4–3.1)

## 2021-02-17 LAB — ABO/RH: ABO/RH(D): O POS

## 2021-02-17 LAB — RESP PANEL BY RT-PCR (FLU A&B, COVID) ARPGX2
Influenza A by PCR: NEGATIVE
Influenza B by PCR: NEGATIVE
SARS Coronavirus 2 by RT PCR: NEGATIVE

## 2021-02-17 LAB — POC OCCULT BLOOD, ED: Fecal Occult Bld: NEGATIVE

## 2021-02-17 LAB — VITAMIN B12: Vitamin B-12: 256 pg/mL (ref 180–914)

## 2021-02-17 LAB — PREPARE RBC (CROSSMATCH)

## 2021-02-17 LAB — GLUCOSE, CAPILLARY: Glucose-Capillary: 157 mg/dL — ABNORMAL HIGH (ref 70–99)

## 2021-02-17 LAB — CBG MONITORING, ED: Glucose-Capillary: 148 mg/dL — ABNORMAL HIGH (ref 70–99)

## 2021-02-17 LAB — TROPONIN I (HIGH SENSITIVITY): Troponin I (High Sensitivity): 402 ng/L (ref <18)

## 2021-02-17 LAB — FERRITIN: Ferritin: 1541 ng/mL — ABNORMAL HIGH (ref 24–336)

## 2021-02-17 MED ORDER — VITAMIN B-12 1000 MCG PO TABS
1000.0000 ug | ORAL_TABLET | Freq: Every day | ORAL | Status: DC
  Administered 2021-02-17 – 2021-02-20 (×4): 1000 ug via ORAL
  Filled 2021-02-17 (×4): qty 1

## 2021-02-17 MED ORDER — VITAMIN B-12 1000 MCG PO TABS: 500.0000 ug | ORAL_TABLET | Freq: Every day | ORAL | Status: DC

## 2021-02-17 MED ORDER — GLIPIZIDE ER 5 MG PO TB24
5.0000 mg | ORAL_TABLET | Freq: Two times a day (BID) | ORAL | Status: DC
  Filled 2021-02-17 (×2): qty 1

## 2021-02-17 MED ORDER — MAGNESIUM CHLORIDE 64 MG PO TBEC
1.0000 | DELAYED_RELEASE_TABLET | Freq: Every day | ORAL | Status: DC
  Administered 2021-02-18 – 2021-02-21 (×4): 64 mg via ORAL
  Filled 2021-02-17 (×4): qty 1

## 2021-02-17 MED ORDER — CALCITRIOL 0.25 MCG PO CAPS
0.2500 ug | ORAL_CAPSULE | Freq: Every day | ORAL | Status: DC
  Administered 2021-02-18 – 2021-02-21 (×4): 0.25 ug via ORAL
  Filled 2021-02-17 (×4): qty 1

## 2021-02-17 MED ORDER — LORATADINE 10 MG PO TABS
10.0000 mg | ORAL_TABLET | Freq: Every day | ORAL | Status: DC
  Administered 2021-02-18 – 2021-02-21 (×4): 10 mg via ORAL
  Filled 2021-02-17 (×4): qty 1

## 2021-02-17 MED ORDER — FAMOTIDINE 20 MG PO TABS
20.0000 mg | ORAL_TABLET | Freq: Every day | ORAL | Status: DC
  Administered 2021-02-18 – 2021-02-21 (×4): 20 mg via ORAL
  Filled 2021-02-17 (×4): qty 1

## 2021-02-17 MED ORDER — SODIUM CHLORIDE 0.9 % IV SOLN
10.0000 mL/h | Freq: Once | INTRAVENOUS | Status: AC
  Administered 2021-02-17: 10 mL/h via INTRAVENOUS

## 2021-02-17 MED ORDER — PRAVASTATIN SODIUM 10 MG PO TABS
10.0000 mg | ORAL_TABLET | Freq: Every day | ORAL | Status: DC
  Administered 2021-02-17 – 2021-02-20 (×4): 10 mg via ORAL
  Filled 2021-02-17 (×6): qty 1

## 2021-02-17 MED ORDER — GLIPIZIDE ER 5 MG PO TB24
5.0000 mg | ORAL_TABLET | Freq: Every day | ORAL | Status: DC
Start: 2021-02-18 — End: 2021-02-18
  Filled 2021-02-17: qty 1

## 2021-02-17 MED ORDER — INSULIN GLARGINE 100 UNIT/ML ~~LOC~~ SOLN
15.0000 [IU] | Freq: Every day | SUBCUTANEOUS | Status: DC
  Filled 2021-02-17 (×2): qty 0.15

## 2021-02-17 MED ORDER — ACETAMINOPHEN 500 MG PO TABS: 1000.0000 mg | ORAL_TABLET | Freq: Four times a day (QID) | ORAL | Status: DC | PRN

## 2021-02-17 MED ORDER — CINACALCET HCL 30 MG PO TABS
30.0000 mg | ORAL_TABLET | Freq: Every day | ORAL | Status: DC
  Administered 2021-02-18 – 2021-02-21 (×4): 30 mg via ORAL
  Filled 2021-02-17 (×4): qty 1

## 2021-02-17 MED ORDER — METOPROLOL SUCCINATE ER 25 MG PO TB24
25.0000 mg | ORAL_TABLET | Freq: Every day | ORAL | Status: DC
  Administered 2021-02-17 – 2021-02-21 (×5): 25 mg via ORAL
  Filled 2021-02-17 (×5): qty 1

## 2021-02-17 MED ORDER — LINAGLIPTIN 5 MG PO TABS
5.0000 mg | ORAL_TABLET | Freq: Every day | ORAL | Status: DC
  Administered 2021-02-18: 5 mg via ORAL
  Filled 2021-02-17: qty 1

## 2021-02-17 MED ORDER — INSULIN ASPART 100 UNIT/ML ~~LOC~~ SOLN
0.0000 [IU] | Freq: Three times a day (TID) | SUBCUTANEOUS | Status: DC
  Administered 2021-02-19 (×2): 1 [IU] via SUBCUTANEOUS
  Administered 2021-02-20: 3 [IU] via SUBCUTANEOUS
  Administered 2021-02-20 – 2021-02-21 (×2): 2 [IU] via SUBCUTANEOUS
  Administered 2021-02-21: 3 [IU] via SUBCUTANEOUS

## 2021-02-17 MED ORDER — FUROSEMIDE 40 MG PO TABS: 40.0000 mg | ORAL_TABLET | Freq: Every day | ORAL | Status: DC

## 2021-02-17 MED ORDER — AZATHIOPRINE 50 MG PO TABS: 150.0000 mg | ORAL_TABLET | Freq: Every day | ORAL | Status: DC

## 2021-02-17 MED ORDER — TACROLIMUS 0.5 MG PO CP24
1.0000 mg | ORAL_CAPSULE | Freq: Two times a day (BID) | ORAL | Status: DC
  Administered 2021-02-17 – 2021-02-18 (×2): 1 mg via ORAL

## 2021-02-17 MED ORDER — LOPERAMIDE HCL 2 MG PO CAPS
2.0000 mg | ORAL_CAPSULE | ORAL | Status: DC | PRN
  Administered 2021-02-18: 2 mg via ORAL
  Filled 2021-02-17: qty 1

## 2021-02-17 NOTE — Consult Note (Signed)
Referral MD  Reason for Referral: Pancytopenia -chronic renal failure -- s/p renal transplant in 2012  Chief Complaint  Patient presents with  . Shortness of Breath    Abnormal labs  : I am very weak.  HPI: Russell Bowers is a very nice 70 year old white male.  He has a very interesting history.  He has history of IgA nephropathy.  He underwent a renal transplant at [facility] in 2012.  He has been on immunosuppressant therapy.  He also has history of prostate cancer.  This was treated with prostatectomy probably about 10 years ago.  He is followed by [facility] for his renal issues.  He has had mild renal insufficiency.  However, has never had anemia.  He never been transfused.  He has been noted to be more anemic.  He had lab work done last week.  He was found to have hemoglobin of six.  He is does have a transfusion.  He got very weak.  He was subsequently sent to the emergency room is being admitted for profound anemia.  When he came in, his CBC showed a white cell count of 1.8.  Hemoglobin 5.2.  Platelet count 120,000.  The MCV is 116.  His reticulocyte count is nonexistent.  Corrected reticulocyte count is basically zero.  He has relatively normal differential.  He does have a marked decrease in lymphocytes.  His metabolic panel shows a BUN of 66 creatinine 3.39.  Calcium is 9.4 with an albumin of 3.1.  Is iron saturation was 88%.  Ferritin was 1500.  He had a B12 level of 256.  I will adjust his blood smear.  I do not see any nucleated red blood cells.  He had no schistocytes or spherocytes.  There was no rouleaux formation.  He had no target cells.  His white blood cells appear normal in morphology.  There were no hyper segmented polys.  He had no immature myeloid or lymphoid cells.  Platelets were decreased in number but he had a few large platelets.  He has had no obvious bleeding.  His appetite has been good.  He has had no weight loss.  He has had no rashes.   There is been no swollen lymph nodes.  He has had occasional leg swelling.  There is been occasional bouts of diarrhea but this is not unusual.  We were asked to see him to try to help manage the anemia and leukopenia.  He has never been transfused.  Is never had any type of ESA product.  There is no obvious occupational exposures.  There is no history of tobacco use.  There really is no history of alcohol use.  Overall, his performance status is ECOG 1.    Past Medical History:  Diagnosis Date  . ADHD (attention deficit hyperactivity disorder)   . Allergic rhinitis   . Cancer [facility])    Prostate cancer  . Cellulitis and abscess of trunk 2014   history of back abscess  . Cellulitis and abscess of unspecified site 2014   lower back  . Detached retina   . Diabetes mellitus without complication ([place])   . Elevated troponin   . Fistula    OF LEFT ARM  . Gout   . H/O kidney transplant   . History of congestive heart failure    FOLLOWED BY DR. Marlou Porch   . History of recurrent pneumonia   . Hypercarbia    CHRONIC HYPERCARBIC RESPIRATORY FAILURE/CHRONIC PULMONARY INTERSTITIAL DISEASE OF UNKNOWN  ETIOLOGY, PULMONOLOGIST DR. Melvyn Novas  . Hypertension   . Mild concentric left ventricular hypertrophy (LVH)    AND AORTIC STENOSIS FOLLOWED IN THE PAST BY CARDIOLOGIST DR. Marlou Porch.  . OSA treated with BiPAP   . Personal history of colonic polyps   . Prostate cancer ([place]) 11/2010   PROSTATECTOMY WITH UROLOGIST DR. DAVIS  . Renal failure   . Shortness of breath   . Sleep disorder breathing    FOLLOWED BY PULMONOLOGIST DR. Elsworth Soho  :  Past Surgical History:  Procedure Laterality Date  . CATARACT EXTRACTION, BILATERAL  11/2014  . COLONOSCOPY  09/2014  . KIDNEY TRANSPLANT  12/01/2011  . PROSTATECTOMY     FOR PROSTATE CANCER  . prostectomy  11/2010  . RETINAL DETACHMENT SURGERY  06/2002  :   Current Facility-Administered Medications:  .  acetaminophen (TYLENOL) tablet 1,000 mg, 1,000 mg,  Oral, Q6H PRN, Lequita Halt, MD .  Derrill Memo ON 02/18/2021] calcitRIOL (ROCALTROL) capsule 0.25 mcg, 0.25 mcg, Oral, Daily, Wynetta Fines T, MD .  Derrill Memo ON 02/18/2021] cinacalcet (SENSIPAR) tablet 30 mg, 30 mg, Oral, Daily, Wynetta Fines T, MD .  Derrill Memo ON 02/18/2021] famotidine (PEPCID) tablet 20 mg, 20 mg, Oral, Daily, Wynetta Fines T, MD .  Derrill Memo ON 02/18/2021] furosemide (LASIX) tablet 40 mg, 40 mg, Oral, Daily, Wynetta Fines T, MD .  Derrill Memo ON 02/18/2021] glipiZIDE (GLUCOTROL XL) 24 hr tablet 5 mg, 5 mg, Oral, Q breakfast, Zhang, Ping T, MD .  insulin aspart (novoLOG) injection 0-9 Units, 0-9 Units, Subcutaneous, TID WC, Zhang, Ping T, MD .  insulin glargine (LANTUS) injection 15 Units, 15 Units, Subcutaneous, QHS, Wynetta Fines T, MD .  Derrill Memo ON 02/18/2021] linagliptin (TRADJENTA) tablet 5 mg, 5 mg, Oral, Daily, Wynetta Fines T, MD .  loperamide (IMODIUM) capsule 2 mg, 2 mg, Oral, Q4H PRN, Wynetta Fines T, MD .  Derrill Memo ON 02/18/2021] loratadine (CLARITIN) tablet 10 mg, 10 mg, Oral, Daily, Wynetta Fines T, MD .  Derrill Memo ON 02/18/2021] magnesium chloride (SLOW-MAG) 64 MG SR tablet 64 mg, 1 tablet, Oral, Daily, Wynetta Fines T, MD .  metoprolol succinate (TOPROL-XL) 24 hr tablet 25 mg, 25 mg, Oral, Daily, Wynetta Fines T, MD, 25 mg at 02/17/21 1746 .  pravastatin (PRAVACHOL) tablet 10 mg, 10 mg, Oral, QHS, Zhang, Pearletha Forge T, MD .  Tacrolimus CP24 1 mg, 1 mg, Oral, BID, Zhang, Ralene Cork, MD .  vitamin B-12 (CYANOCOBALAMIN) tablet 500 mcg, 500 mcg, Oral, QHS, Lequita Halt, MD  Current Outpatient Medications:  .  acetaminophen (TYLENOL) 500 MG tablet, Take 1,000 mg by mouth every 6 (six) hours as needed for moderate pain., Disp: , Rfl:  .  azaTHIOprine (IMURAN) 50 MG tablet, Take 150 mg daily by mouth., Disp: , Rfl:  .  calcitRIOL (ROCALTROL) 0.25 MCG capsule, Take 0.25 mcg by mouth daily., Disp: , Rfl:  .  cinacalcet (SENSIPAR) 30 MG tablet, Take 30 mg by mouth daily., Disp: , Rfl:  .  famotidine (PEPCID) 20 MG tablet, Take 20  mg by mouth daily., Disp: , Rfl:  .  furosemide (LASIX) 40 MG tablet, Take 40 mg by mouth daily., Disp: , Rfl:  .  glipiZIDE (GLUCOTROL XL) 5 MG 24 hr tablet, Take 5 mg by mouth 2 (two) times daily at 8 am and 10 pm., Disp: , Rfl:  .  insulin glargine (LANTUS) 100 UNIT/ML injection, Inject 15 Units into the skin at bedtime., Disp: , Rfl:  .  loperamide (IMODIUM) 2 MG capsule, Take 2  mg by mouth as needed for diarrhea or loose stools., Disp: , Rfl:  .  loratadine (CLARITIN) 10 MG tablet, Take 10 mg by mouth daily., Disp: , Rfl:  .  Magnesium Cl-Calcium Carbonate (SLOW-MAG PO), Take 1 tablet by mouth daily. , Disp: , Rfl:  .  metoprolol succinate (TOPROL-XL) 50 MG 24 hr tablet, Take 0.5 tablets by mouth daily. , Disp: , Rfl:  .  pravastatin (PRAVACHOL) 10 MG tablet, Take 10 mg by mouth at bedtime. , Disp: , Rfl:  .  Tacrolimus 0.5 MG CP24, Take 1 mg by mouth 2 (two) times daily. Taking 2 every am and 2 every pm, Disp: , Rfl:  .  TRADJENTA 5 MG TABS tablet, Take 5 mg by mouth daily., Disp: , Rfl:  .  vitamin B-12 (CYANOCOBALAMIN) 500 MCG tablet, Take 500 mcg by mouth at bedtime., Disp: , Rfl: :  . [START ON 02/18/2021] calcitRIOL  0.25 mcg Oral Daily  . [START ON 02/18/2021] cinacalcet  30 mg Oral Daily  . [START ON 02/18/2021] famotidine  20 mg Oral Daily  . [START ON 02/18/2021] furosemide  40 mg Oral Daily  . [START ON 02/18/2021] glipiZIDE  5 mg Oral Q breakfast  . insulin aspart  0-9 Units Subcutaneous TID WC  . insulin glargine  15 Units Subcutaneous QHS  . [START ON 02/18/2021] linagliptin  5 mg Oral Daily  . [START ON 02/18/2021] loratadine  10 mg Oral Daily  . [START ON 02/18/2021] magnesium chloride  1 tablet Oral Daily  . metoprolol succinate  25 mg Oral Daily  . pravastatin  10 mg Oral QHS  . Tacrolimus  1 mg Oral BID  . vitamin B-12  500 mcg Oral QHS  :  Allergies  Allergen Reactions  . Pollen Extract Other (See Comments)    stuffy nose  :  Family History  Problem Relation Age  of Onset  . Diabetes Mother   . Hypertension Mother   . Atrial fibrillation Mother   . Alzheimer's disease Father   . Heart Problems Sister   . Heart disease Maternal Grandfather   . Heart disease Maternal Grandmother   :  Social History   Socioeconomic History  . Marital status: Married    Spouse name: Not on file  . Number of children: Not on file  . Years of education: Not on file  . Highest education level: Not on file  Occupational History  . Occupation: Retired  Tobacco Use  . Smoking status: Former Smoker    Packs/day: 1.00    Years: 21.00    Pack years: 21.00    Types: Cigarettes    Quit date: 03/16/1993    Years since quitting: 27.9  . Smokeless tobacco: Never Used  Vaping Use  . Vaping Use: Never used  Substance and Sexual Activity  . Alcohol use: Yes    Alcohol/week: 1.0 - 2.0 standard drink    Types: 1 - 2 Glasses of wine per week  . Drug use: No  . Sexual activity: Not on file  Other Topics Concern  . Not on file  Social History Narrative  . Not on file   Social Determinants of Health   Financial Resource Strain: Not on file  Food Insecurity: Not on file  Transportation Needs: Not on file  Physical Activity: Not on file  Stress: Not on file  Social Connections: Not on file  Intimate Partner Violence: Not on file  :  Review of Systems  Constitutional: Positive for  malaise/fatigue.  HENT: Negative.   Eyes: Negative.   Respiratory: Negative.   Cardiovascular: Negative.   Gastrointestinal: Negative.   Genitourinary: Negative.   Musculoskeletal: Negative.   Skin: Negative.   Neurological: Negative.   Endo/Heme/Allergies: Negative.   Psychiatric/Behavioral: Negative.      Exam:  This is a pale appearing white male in no obvious distress.  Vital signs show a temperature of 98.  Pulse 83.  Blood pressure 127/66.  His head neck exam shows pale conjunctiva.  He has no scleral icterus.  He has no adenopathy in the neck.  Lungs are clear.   Cardiac exam regular rate and rhythm.  He has a 1/6 systolic ejection murmur.  Abdomen is soft.  He has good bowel sounds.  There is no fluid wave.  There is no palpable liver or spleen tip.  Back exam shows no tenderness over the spine, ribs or hips.  Strongly show some mild edema in the lower legs.  Skin exam shows no rashes.  Neurological exam is nonfocal.  Patient Vitals for the past 24 hrs:  BP Temp Temp src Pulse Resp SpO2  02/17/21 1745 127/66 -- -- 83 (!) 28 99 %  02/17/21 1730 123/65 -- -- 84 (!) 24 100 %  02/17/21 1715 120/65 -- -- 83 (!) 26 100 %  02/17/21 1700 120/67 -- -- 88 (!) 22 100 %  02/17/21 1648 124/74 98 F (36.7 C) Oral 85 (!) 26 99 %  02/17/21 1648 124/74 -- -- 90 (!) 27 100 %  02/17/21 1618 135/77 98.1 F (36.7 C) Oral 86 18 --  02/17/21 1615 135/77 -- -- 85 (!) 24 100 %  02/17/21 1530 125/66 -- -- 84 (!) 29 99 %  02/17/21 1430 131/68 -- -- 84 (!) 22 99 %  02/17/21 1349 133/64 98.2 F (36.8 C) Oral (!) 104 16 100 %     Recent Labs    02/17/21 1353  WBC 1.8*  HGB 5.6*  HCT 18.2*  PLT 121*   Recent Labs    02/17/21 1353  NA 138  K 5.1  CL 108  CO2 23  GLUCOSE 218*  BUN 66*  CREATININE 3.39*  CALCIUM 9.4    Blood smear review: See above  Pathology: None    Assessment and Plan: Mr. Ewen is a really nice 70 year old white male.  He has a history of IgA nephropathy.  He is on a kidney transplant.  He now has marked anemia with leukopenia and mild thrombocytopenia.  I think the key is the fact that he has no reticulocyte count.  This tells me that his marrow is clearly not getting any signal for making red cells.  I would think with his chronic renal insufficiency, his erythropoietin levels will be quite low.  I am somewhat surprised that he does not have iron deficiency.  In fact, looks like he may have iron overload.  His iron saturations 88%.  We probably need to check him for hemochromatosis.  He is on immunosuppressant therapy.  This is  certainly not helping his bone marrow response.  I am unsure if this could even be stopped.  I would still believe that he is going to need a bone marrow biopsy.  I think this is very important for [place].  I noted that his MCV is quite high now.  As such, an element of myelodysplasia could always be considered.  He is being transfused.  I understand why he needs this.  I agree with  this.  We will see what his erythropoietin level is.  I probably will get him on a short acting ESA to try to stimulate his bone marrow.  He is not obviously infected.  He is leukopenic.  I would just watch this for right now.  I would not give him any G-CSF.  His platelet count is mildly depressed.  This should not cause any problems for him.  I think we have multiple reasons for him to be anemic.  I think the biggest one however is that there is no marrow stimulus for red cell production.  We will follow along.  Thank you so much for allowing [place] to see him.  He is very nice.  His wife was with him.  Lattie Haw, MD  2 Corinthians 12:9-10

## 2021-02-17 NOTE — ED Notes (Signed)
Pt going to xray, will be returning to room.

## 2021-02-17 NOTE — Progress Notes (Signed)
2nd unit of RBC transfusing. Vital signs taken. Pt not in distress and tolerated well.

## 2021-02-17 NOTE — ED Provider Notes (Addendum)
[facility] EMERGENCY DEPARTMENT Provider Note   CSN: [id number] Arrival date & time: 02/17/21  1345     History Chief Complaint  Patient presents with  . Shortness of Breath    Abnormal labs    Russell Bowers is a 70 y.o. male.  Patient with remote hx esrd/dialysis s/p renal transplant 2012, presents with worsening anemia, and generalized weakness. Symptoms gradual onset, persistent, worsening, moder-severe. States his hemoglobin had been 12 but has been trending down to 6 last week on labs from his nephrologist. Had plan to get transfusion next week, but states feeling very weak, and with any activity gets chest tightness/sob and general weakness. No syncope. Denies acute blood loss, no rectal bleeding or melena. Denies hx prior transfusion, or Fe infusion or epo.   The history is provided by the patient.  Shortness of Breath Associated symptoms: no abdominal pain, no cough, no fever, no headaches, no neck pain, no rash, no sore throat and no vomiting        Past Medical History:  Diagnosis Date  . ADHD (attention deficit hyperactivity disorder)   . Allergic rhinitis   . Cancer [facility])    Prostate cancer  . Cellulitis and abscess of trunk 2014   history of back abscess  . Cellulitis and abscess of unspecified site 2014   lower back  . Detached retina   . Diabetes mellitus without complication ([place])   . Elevated troponin   . Fistula    OF LEFT ARM  . Gout   . H/O kidney transplant   . History of congestive heart failure    FOLLOWED BY DR. Marlou Porch   . History of recurrent pneumonia   . 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 Melvyn Novas  . Hypertension   . Mild concentric left ventricular hypertrophy (LVH)    AND AORTIC STENOSIS FOLLOWED IN THE PAST BY CARDIOLOGIST DR. Marlou Porch.  . OSA treated with BiPAP   . Personal history of colonic polyps   . Prostate cancer  ([place]) 11/2010   PROSTATECTOMY WITH UROLOGIST DR. DAVIS  . Renal failure   . Shortness of breath   . Sleep disorder breathing    FOLLOWED BY PULMONOLOGIST DR. Elsworth Soho    Patient Active Problem List   Diagnosis Date Noted  . Atelectasis 02/19/2019  . OSA (obstructive sleep apnea) 03/16/2014  . Chronic respiratory failure ([place]) 01/21/2014  . Obesity 04/17/2013  . Chronic diastolic heart failure ([place]) 04/13/2013  . Hypokalemia 04/13/2013  . Demand ischemia of myocardium ([place]) 04/13/2013  . RBBB 04/11/2013  . History of renal transplantation 04/11/2013  . Immunocompromised ([place]) 04/11/2013  . DM2 (diabetes mellitus, type 2) ([place]) 04/11/2013    Past Surgical History:  Procedure Laterality Date  . CATARACT EXTRACTION, BILATERAL  11/2014  . COLONOSCOPY  09/2014  . KIDNEY TRANSPLANT  12/01/2011  . PROSTATECTOMY     FOR PROSTATE CANCER  . prostectomy  11/2010  . RETINAL DETACHMENT SURGERY  06/2002       Family History  Problem Relation Age of Onset  . Diabetes Mother   . Hypertension Mother   . Atrial fibrillation Mother   . Alzheimer's disease Father   . Heart Problems Sister   . Heart disease Maternal Grandfather   . Heart disease Maternal Grandmother     Social History   Tobacco Use  . Smoking status: Former Smoker    Packs/day: 1.00    Years: 21.00  Pack years: 21.00    Types: Cigarettes    Quit date: 03/16/1993    Years since quitting: 27.9  . Smokeless tobacco: Never Used  Vaping Use  . Vaping Use: Never used  Substance Use Topics  . Alcohol use: Yes    Alcohol/week: 1.0 - 2.0 standard drink    Types: 1 - 2 Glasses of wine per week  . Drug use: No    Home Medications Prior to Admission medications   Medication Sig Start Date End Date Taking? Authorizing Provider  acetaminophen (TYLENOL) 500 MG tablet Take 1,000 mg by mouth every 6 (six) hours as needed for moderate pain.    [provider]  azaTHIOprine (IMURAN) 50 MG tablet Take 150 mg daily by  mouth.    [provider]  cinacalcet (SENSIPAR) 30 MG tablet Take 30 mg by mouth every other day.     [provider]  famotidine (PEPCID) 20 MG tablet Take 20 mg by mouth 2 (two) times daily.    [provider]  furosemide (LASIX) 20 MG tablet Take 20 mg by mouth daily.     [provider]  glimepiride (AMARYL) 1 MG tablet Take 1 tablet by mouth daily. 01/21/19   [provider]  insulin glargine (LANTUS) 100 UNIT/ML injection Inject 15 Units into the skin at bedtime.    [provider]  loperamide (IMODIUM) 2 MG capsule Take 2 mg by mouth as needed for diarrhea or loose stools.    [provider]  Loperamide-Simethicone (IMODIUM MULTI-SYMPTOM RELIEF) 2-125 MG TABS Take 1 tablet daily as needed by mouth.    [provider]  loratadine (CLARITIN) 10 MG tablet Take 10 mg by mouth daily.    [provider]  Magnesium Cl-Calcium Carbonate (SLOW-MAG PO) Take 1 tablet by mouth daily.     [provider]  metoprolol succinate (TOPROL-XL) 50 MG 24 hr tablet Take 0.5 tablets by mouth daily.     [provider]  pravastatin (PRAVACHOL) 10 MG tablet Take 10 mg by mouth at bedtime.     [provider]  Tacrolimus 0.5 MG CP24 Take 1 mg by mouth 2 (two) times daily. Taking 2 every am and 1 every pm    [provider]  vitamin B-12 (CYANOCOBALAMIN) 500 MCG tablet Take 500 mcg by mouth daily.    [provider]    Allergies    Pollen extract  Review of Systems   Review of Systems  Constitutional: Negative for fever.  HENT: Negative for sore throat.   Eyes: Negative for redness.  Respiratory: Positive for shortness of breath. Negative for cough.   Cardiovascular: Negative for leg swelling.  Gastrointestinal: Negative for abdominal pain, blood in stool, diarrhea and vomiting.  Genitourinary: Negative for flank pain.  Musculoskeletal: Negative for back pain and neck pain.  Skin:  Negative for rash.  Neurological: Positive for weakness. Negative for headaches.  Hematological: Does not bruise/bleed easily.  Psychiatric/Behavioral: Negative for confusion.    Physical Exam Updated Vital Signs BP 133/64 (BP Location: Left Arm)   Pulse (!) 104   Temp 98.2 F (36.8 C) (Oral)   Resp 16   SpO2 100%   Physical Exam Vitals and nursing note reviewed.  Constitutional:      Appearance: Normal appearance. He is well-developed.  HENT:     Head: Atraumatic.     Nose: Nose normal.     Mouth/Throat:     Mouth: Mucous membranes are moist.  Pharynx: Oropharynx is clear.  Eyes:     General: No scleral icterus.    Comments: Pale conj.  Neck:     Trachea: No tracheal deviation.  Cardiovascular:     Rate and Rhythm: Normal rate and regular rhythm.     Pulses: Normal pulses.     Heart sounds: Normal heart sounds. No murmur heard. No friction rub. No gallop.   Pulmonary:     Effort: Pulmonary effort is normal. No accessory muscle usage or respiratory distress.     Breath sounds: Normal breath sounds.  Abdominal:     General: Bowel sounds are normal. There is no distension.     Palpations: Abdomen is soft.     Tenderness: There is no abdominal tenderness. There is no guarding.  Genitourinary:    Comments: No cva tenderness. Light brown stool, sent for hemoccult. Musculoskeletal:        General: No swelling.     Cervical back: Normal range of motion and neck supple. No rigidity.  Skin:    General: Skin is warm and dry.     Coloration: Skin is pale.     Findings: No rash.  Neurological:     Mental Status: He is alert.     Comments: Alert, speech clear.   Psychiatric:        Mood and Affect: Mood normal.     ED Results / Procedures / Treatments   Labs (all labs ordered are listed, but only abnormal results are displayed) Results for orders placed or performed during the hospital encounter of 02/17/21  Comprehensive metabolic panel  Result Value Ref Range    Sodium 138 135 - 145 mmol/L   Potassium 5.1 3.5 - 5.1 mmol/L   Chloride 108 98 - 111 mmol/L   CO2 23 22 - 32 mmol/L   Glucose, Bld 218 (H) 70 - 99 mg/dL   BUN 66 (H) 8 - 23 mg/dL   Creatinine, Ser 3.39 (H) 0.61 - 1.24 mg/dL   Calcium 9.4 8.9 - 10.3 mg/dL   Total Protein 6.6 6.5 - 8.1 g/dL   Albumin 3.1 (L) 3.5 - 5.0 g/dL   AST 11 (L) 15 - 41 U/L   ALT 10 0 - 44 U/L   Alkaline Phosphatase 88 38 - 126 U/L   Total Bilirubin 0.4 0.3 - 1.2 mg/dL   GFR, Estimated 19 (L) >60 mL/min   Anion gap 7 5 - 15  CBC WITH DIFFERENTIAL  Result Value Ref Range   WBC 1.8 (L) 4.0 - 10.5 K/uL   RBC 1.57 (L) 4.22 - 5.81 MIL/uL   Hemoglobin 5.6 (LL) 13.0 - 17.0 g/dL   HCT 18.2 (L) 39.0 - 52.0 %   MCV 115.9 (H) 80.0 - 100.0 fL   MCH 35.7 (H) 26.0 - 34.0 pg   MCHC 30.8 30.0 - 36.0 g/dL   RDW 14.1 11.5 - 15.5 %   Platelets 121 (L) 150 - 400 K/uL   nRBC 0.0 0.0 - 0.2 %   Neutrophils Relative % PENDING %   Neutro Abs PENDING 1.7 - 7.7 K/uL   Band Neutrophils PENDING %   Lymphocytes Relative PENDING %   Lymphs Abs PENDING 0.7 - 4.0 K/uL   Monocytes Relative PENDING %   Monocytes Absolute PENDING 0.1 - 1.0 K/uL   Eosinophils Relative PENDING %   Eosinophils Absolute PENDING 0.0 - 0.5 K/uL   Basophils Relative PENDING %   Basophils Absolute PENDING 0.0 - 0.1 K/uL   WBC Morphology  PENDING    RBC Morphology PENDING    Smear Review PENDING    Other PENDING %   nRBC PENDING 0 /100 WBC   Metamyelocytes Relative PENDING %   Myelocytes PENDING %   Promyelocytes Relative PENDING %   Blasts PENDING %   Immature Granulocytes PENDING %   Abs Immature Granulocytes PENDING 0.00 - 0.07 K/uL  Reticulocytes  Result Value Ref Range   Retic Ct Pct <0.4 (L) 0.4 - 3.1 %   RBC. 1.41 (L) 4.22 - 5.81 MIL/uL   Retic Count, Absolute 2.4 (L) 19.0 - 186.0 K/uL   Immature Retic Fract 2.7 2.3 - 15.9 %  Protime-INR  Result Value Ref Range   Prothrombin Time 14.7 11.4 - 15.2 seconds   INR 1.2 0.8 - 1.2  POC occult  blood, ED  Result Value Ref Range   Fecal Occult Bld NEGATIVE NEGATIVE  Type and screen [place]  Result Value Ref Range   ABO/RH(D) O POS    Antibody Screen NEG    Sample Expiration      02/20/2021,2359 Performed at [facility], [address], [place], [place] [address]   ABO/Rh  Result Value Ref Range   ABO/RH(D)      O POS Performed at [facility] [address] [place], [place] [address]    DG Chest 2 View  Result Date: 02/17/2021 CLINICAL DATA:  Shortness of breath on exertion today. EXAM: CHEST - 2 VIEW COMPARISON:  Chest radiograph September 01, 2020 FINDINGS: The visible mediastinal contours are stable. Low lung volumes with subsegmental atelectasis, similar to prior. No pleural effusion. No focal consolidation. No pneumothorax. The visualized skeletal structures are unremarkable. IMPRESSION: No acute cardiopulmonary disease. Electronically Signed   By: Dahlia Bailiff MD   On: 02/17/2021 14:15    EKG EKG Interpretation  Date/Time:  Saturday February 17 2021 13:51:36 EST Ventricular Rate:  96 PR Interval:    QRS Duration: 90 QT Interval:  356 QTC Calculation: 449 R Axis:   36 Text Interpretation: Sinus rhythm with Premature supraventricular complexes Nonspecific ST abnormality Confirmed by Lajean Saver [phone]) on 02/17/2021 1:59:40 PM   Radiology DG Chest 2 View  Result Date: 02/17/2021 CLINICAL DATA:  Shortness of breath on exertion today. EXAM: CHEST - 2 VIEW COMPARISON:  Chest radiograph September 01, 2020 FINDINGS: The visible mediastinal contours are stable. Low lung volumes with subsegmental atelectasis, similar to prior. No pleural effusion. No focal consolidation. No pneumothorax. The visualized skeletal structures are unremarkable. IMPRESSION: No acute cardiopulmonary disease. Electronically Signed   By: Dahlia Bailiff MD   On: 02/17/2021 14:15    Procedures Procedures   Medications Ordered in ED Medications - No data  to display  ED Course  I have reviewed the triage vital signs and the nursing notes.  Pertinent labs & imaging results that were available during my care of the patient were reviewed by me and considered in my medical decision making (see chart for details).    MDM Rules/Calculators/A&P                         Iv ns. Stat labs. Continuous pulse ox and continuous cardiac monitoring. Ecg. Cxr.   Reviewed nursing notes and prior charts for additional history.   Labs reviewed/interpreted by me -  inr normal. Cbc pending. Hemoccult neg.   CXR reviewed/interpreted by me - no pna.   1540, labs incl cbc, trop pending.  Recheck, labs results. hgb 5. Also ? mild aki compared to prior but that result was remote.  Will emergently transfuse prbc given symptomatic anemia, sob/chest tight w minimal activity.   Will consult hospitalists for admission.  Pt likely to also benefit from heme/onc consult during admission (re pancytopenia) and nephrology consultation.   CRITICAL CARE RE: symptomatic anemia w emergent transfusion, pancytopenia, aki Performed by: Mirna Mires Total critical care time: 45 minutes Critical care time was exclusive of separately billable procedures and treating other patients. Critical care was necessary to treat or prevent imminent or life-threatening deterioration. Critical care was time spent personally by me on the following activities: development of treatment plan with patient and/or surrogate as well as nursing, discussions with consultants, evaluation of patient's response to treatment, examination of patient, obtaining history from patient or surrogate, ordering and performing treatments and interventions, ordering and review of laboratory studies, ordering and review of radiographic studies, pulse oximetry and re-evaluation of patient's condition.    Final Clinical Impression(s) / ED Diagnoses Final diagnoses:  None    Rx / DC Orders ED Discharge Orders    None        Lajean Saver, MD 02/17/21 1541    Lajean Saver, MD 02/17/21 [id number]

## 2021-02-17 NOTE — H&P (Signed)
History and Physical    Russell Bowers PRF:[id number] DOB: March 02, 1951 DOA: 02/17/2021  PCP: Hulan Fess, MD (Confirm with patient/family/NH records and if not entered, this has to be entered at [facility] point of entry) Patient coming from: Home  I have personally briefly reviewed patient's old medical records in [place]  Chief Complaint: SOB  HPI: Russell Bowers is a 70 y.o. male with medical history significant of kidney transplant on Imuran and Prograf, CKD stage IV, anemia secondary to CKD, remote history of prostate cancer that is post prostatectomy, IDDM, HTN, presented with increasing shortness of breath.  Patient has had gradual decrease of hemoglobin, Hb was 12>9>6 over last 6 months, with most recent reading 6.1, 4 days ago at nephrologist office.  Gradually, patient has had worsening of exertional dyspnea, this week, only minimum walk can trigger significant shortness of breath, denies any chest pain no cough no fever chills.  Denies any abdominal pain, no black tarry stool or blood in the stool.  Patient also reported he has had normal iron study at nephrologist office "years". ED Course: Blood work showed pancytopenia with Hb 5.6, leukocyte 1.8, platelet 121.  Reticulocyte count less than 2.4, iron saturation within normal limits.  Review of Systems: As per HPI otherwise 14 point review of systems negative.    Past Medical History:  Diagnosis Date  . ADHD (attention deficit hyperactivity disorder)   . Allergic rhinitis   . Cancer [facility])    Prostate cancer  . Cellulitis and abscess of trunk 2014   history of back abscess  . Cellulitis and abscess of unspecified site 2014   lower back  . Detached retina   . Diabetes mellitus without complication ([place])   . Elevated troponin   . Fistula    OF LEFT ARM  . Gout   . H/O kidney transplant   . History of congestive heart failure    FOLLOWED BY DR. Marlou Porch   . History of recurrent pneumonia   . Hypercarbia     CHRONIC HYPERCARBIC RESPIRATORY FAILURE/CHRONIC PULMONARY INTERSTITIAL DISEASE OF UNKNOWN ETIOLOGY, PULMONOLOGIST DR. Melvyn Novas  . Hypertension   . Mild concentric left ventricular hypertrophy (LVH)    AND AORTIC STENOSIS FOLLOWED IN THE PAST BY CARDIOLOGIST DR. Marlou Porch.  . OSA treated with BiPAP   . Personal history of colonic polyps   . Prostate cancer ([place]) 11/2010   PROSTATECTOMY WITH UROLOGIST DR. DAVIS  . Renal failure   . Shortness of breath   . Sleep disorder breathing    FOLLOWED BY PULMONOLOGIST DR. Elsworth Soho    Past Surgical History:  Procedure Laterality Date  . CATARACT EXTRACTION, BILATERAL  11/2014  . COLONOSCOPY  09/2014  . KIDNEY TRANSPLANT  12/01/2011  . PROSTATECTOMY     FOR PROSTATE CANCER  . prostectomy  11/2010  . RETINAL DETACHMENT SURGERY  06/2002     reports that he quit smoking about 27 years ago. His smoking use included cigarettes. He has a 21.00 pack-year smoking history. He has never used smokeless tobacco. He reports current alcohol use of about 1.0 - 2.0 standard drink of alcohol per week. He reports that he does not use drugs.  Allergies  Allergen Reactions  . Pollen Extract Other (See Comments)    stuffy nose    Family History  Problem Relation Age of Onset  . Diabetes Mother   . Hypertension Mother   . Atrial fibrillation Mother   . Alzheimer's disease Father   . Heart Problems Sister   .  Heart disease Maternal Grandfather   . Heart disease Maternal Grandmother      Prior to Admission medications   Medication Sig Start Date End Date Taking? Authorizing Provider  acetaminophen (TYLENOL) 500 MG tablet Take 1,000 mg by mouth every 6 (six) hours as needed for moderate pain.   Yes [provider]  azaTHIOprine (IMURAN) 50 MG tablet Take 150 mg daily by mouth.   Yes [provider]  calcitRIOL (ROCALTROL) 0.25 MCG capsule Take 0.25 mcg by mouth daily. 11/08/20  Yes [provider]  cinacalcet (SENSIPAR) 30 MG tablet Take 30  mg by mouth daily.   Yes [provider]  famotidine (PEPCID) 20 MG tablet Take 20 mg by mouth daily.   Yes [provider]  furosemide (LASIX) 40 MG tablet Take 40 mg by mouth daily.   Yes [provider]  glipiZIDE (GLUCOTROL XL) 5 MG 24 hr tablet Take 5 mg by mouth 2 (two) times daily at 8 am and 10 pm. 02/13/21  Yes [provider]  insulin glargine (LANTUS) 100 UNIT/ML injection Inject 15 Units into the skin at bedtime.   Yes [provider]  loperamide (IMODIUM) 2 MG capsule Take 2 mg by mouth as needed for diarrhea or loose stools.   Yes [provider]  loratadine (CLARITIN) 10 MG tablet Take 10 mg by mouth daily.   Yes [provider]  Magnesium Cl-Calcium Carbonate (SLOW-MAG PO) Take 1 tablet by mouth daily.    Yes [provider]  metoprolol succinate (TOPROL-XL) 50 MG 24 hr tablet Take 0.5 tablets by mouth daily.    Yes [provider]  pravastatin (PRAVACHOL) 10 MG tablet Take 10 mg by mouth at bedtime.    Yes [provider]  Tacrolimus 0.5 MG CP24 Take 1 mg by mouth 2 (two) times daily. Taking 2 every am and 2 every pm   Yes [provider]  TRADJENTA 5 MG TABS tablet Take 5 mg by mouth daily. 12/01/20  Yes [provider]  vitamin B-12 (CYANOCOBALAMIN) 500 MCG tablet Take 500 mcg by mouth at bedtime.   Yes [provider]    Physical Exam: Vitals:   02/17/21 1615 02/17/21 1618 02/17/21 1648 02/17/21 1648  BP: 135/77 135/77 124/74 124/74  Pulse: 85 86 90 85  Resp: (!) 24 18 (!) 27 (!) 26  Temp:  98.1 F (36.7 C)  98 F (36.7 C)  TempSrc:  Oral  Oral  SpO2: 100%  100%     Constitutional: NAD, calm, comfortable Vitals:   02/17/21 1615 02/17/21 1618 02/17/21 1648 02/17/21 1648  BP: 135/77 135/77 124/74 124/74  Pulse: 85 86 90 85  Resp: (!) 24 18 (!) 27 (!) 26  Temp:  98.1 F (36.7 C)  98 F (36.7 C)  TempSrc:  Oral  Oral  SpO2: 100%  100%    Eyes:  PERRL, lids and conjunctivae normal, Pale appearance ENMT: Mucous membranes are moist. Posterior pharynx clear of any exudate or lesions.Normal dentition.  Neck: normal, supple, no masses, no thyromegaly Respiratory: clear to auscultation bilaterally, no wheezing, no crackles. Normal respiratory effort. No accessory muscle use.  Cardiovascular: Regular rate and rhythm, no murmurs / rubs / gallops. No extremity edema. 2+ pedal pulses. No carotid bruits.  Abdomen: no tenderness, no masses palpated. No hepatosplenomegaly. Bowel sounds positive.  Musculoskeletal: no clubbing / cyanosis. No joint deformity upper and lower extremities. Good ROM, no contractures. Normal muscle tone.  Skin: no rashes, lesions, ulcers.  No induration Neurologic: CN 2-12 grossly intact. Sensation intact, DTR normal. Strength 5/5 in all 4.  Psychiatric: Normal judgment and insight. Alert and oriented x 3. Normal mood.     Labs on Admission: I have personally reviewed following labs and imaging studies  CBC: Recent Labs  Lab 02/17/21 1353  WBC 1.8*  NEUTROABS 1.5*  HGB 5.6*  HCT 18.2*  MCV 115.9*  PLT 762*   Basic Metabolic Panel: Recent Labs  Lab 02/17/21 1353  NA 138  K 5.1  CL 108  CO2 23  GLUCOSE 218*  BUN 66*  CREATININE 3.39*  CALCIUM 9.4   GFR: CrCl cannot be calculated (Unknown ideal weight.). Liver Function Tests: Recent Labs  Lab 02/17/21 1353  AST 11*  ALT 10  ALKPHOS 88  BILITOT 0.4  PROT 6.6  ALBUMIN 3.1*   No results for input(s): LIPASE, AMYLASE in the last 168 hours. No results for input(s): AMMONIA in the last 168 hours. Coagulation Profile: Recent Labs  Lab 02/17/21 1423  INR 1.2   Cardiac Enzymes: No results for input(s): CKTOTAL, CKMB, CKMBINDEX, TROPONINI in the last 168 hours. BNP (last 3 results) No results for input(s): PROBNP in the last 8760 hours. HbA1C: No results for input(s): HGBA1C in the last 72 hours. CBG: No results for input(s): GLUCAP in the  last 168 hours. Lipid Profile: No results for input(s): CHOL, HDL, LDLCALC, TRIG, CHOLHDL, LDLDIRECT in the last 72 hours. Thyroid Function Tests: No results for input(s): TSH, T4TOTAL, FREET4, T3FREE, THYROIDAB in the last 72 hours. Anemia Panel: Recent Labs    02/17/21 1355 02/17/21 1423  VITAMINB12  --  256  FOLATE  --  6.2  FERRITIN  --  1,541*  TIBC  --  238*  IRON  --  209*  RETICCTPCT <0.4*  --    Urine analysis:    Component Value Date/Time   COLORURINE YELLOW 08/10/2014 1844   APPEARANCEUR CLEAR 08/10/2014 1844   LABSPEC 1.010 08/10/2014 1844   PHURINE 6.0 08/10/2014 1844   GLUCOSEU NEGATIVE 08/10/2014 1844   HGBUR NEGATIVE 08/10/2014 1844   BILIRUBINUR NEGATIVE 08/10/2014 1844   KETONESUR NEGATIVE 08/10/2014 1844   PROTEINUR NEGATIVE 08/10/2014 1844   UROBILINOGEN 0.2 08/10/2014 1844   NITRITE NEGATIVE 08/10/2014 1844   LEUKOCYTESUR TRACE (A) 08/10/2014 1844    Radiological Exams on Admission: DG Chest 2 View  Result Date: 02/17/2021 CLINICAL DATA:  Shortness of breath on exertion today. EXAM: CHEST - 2 VIEW COMPARISON:  Chest radiograph September 01, 2020 FINDINGS: The visible mediastinal contours are stable. Low lung volumes with subsegmental atelectasis, similar to prior. No pleural effusion. No focal consolidation. No pneumothorax. The visualized skeletal structures are unremarkable. IMPRESSION: No acute cardiopulmonary disease. Electronically Signed   By: Dahlia Bailiff MD   On: 02/17/2021 14:15    EKG: Independently reviewed.  Sinus, nonspecific ST changes.  Assessment/Plan Active Problems:   Anemia due to chronic kidney disease   Anemia  (please populate well all problems here in Problem List. (For example, if patient is on BP meds at home and you resume or decide to hold them, it is a problem that needs to be her. Same for CAD, COPD, HLD and so on)   Positive troponins -No chest pain, no acute ST changes on EKG, suspect demanding ischemia plus his  CKD, trend troponin. -Agreed with PRBC x2 for now, repeat CBC in AM. -Echo  Worsening of her chronic anemia secondary to CKD -Suspect acute issue is bone marrow suppression,  differential can be immune suppressants especially Imuran, other etiology such as iron overload needs to be ruled out. Discussed with hematologist Dr. Marin Olp, initial impression is bone marrow suppression might related to immunosuppressant, LDH and peripheral smear ordered as per hematology, hematology will see patient tomorrow to decide other test.  Pancytopenia -As above.  CKD stage IV -Euvolemic -Continue Lasix.  Kidney transplant -Continue Imran and Prograf for now given anemia/bone marrow suppression likely is chronic.  DVT prophylaxis: SCD Code Status: Full COde Family Communication: Wife at bedside Disposition Plan: Expect more than 2 midnight hospital stay to treat anemia and further workup. Consults called: Hem, Nephro Admission status: MedSurg   Lequita Halt MD [facility] Pager [phone]  02/17/2021, 4:55 PM

## 2021-02-17 NOTE — ED Notes (Addendum)
IV infiltrated, new IV catheter placed, blood started at 1632.

## 2021-02-17 NOTE — Progress Notes (Signed)
Pt arrived to the unit. Vital signs taken. 1st unit of RBC's currently infusing. Pt not in distress and tolerated well.

## 2021-02-17 NOTE — ED Provider Notes (Signed)
Signout from Dr. Kris Hartmann. 70 year old male history of renal transplant 2012 here with increased weakness shortness of breath known anemia. Was supposed to go transfusion next week. Labs showing hemoglobin of 5.6. Troponin elevated likely demand. Getting 2 units of blood. Need to discuss with hospitalist regarding admission. Physical Exam  BP 125/66   Pulse 84   Temp 98.2 F (36.8 C) (Oral)   Resp (!) 29   SpO2 99%   Physical Exam  ED Course/Procedures     Procedures  MDM  Discussed with Dr. Roosevelt Locks [facility] hospitalist who will evaluate the patient for admission.       Hayden Rasmussen, MD 02/18/21 1006

## 2021-02-17 NOTE — ED Triage Notes (Signed)
Pt reports SOB with exertion.  States his Hgb was 6.1 and he is scheduled to have a blood transfusion on Wednesday.  SOB increased when he walked to mailbox today.

## 2021-02-17 NOTE — Progress Notes (Signed)
D/W on call nephro Dr. Jonnie Finner, recommend hold Imuran and keep Prograf.

## 2021-02-18 ENCOUNTER — Inpatient Hospital Stay (HOSPITAL_COMMUNITY): Payer: Medicare Other

## 2021-02-18 DIAGNOSIS — N189 Chronic kidney disease, unspecified: Secondary | ICD-10-CM | POA: Diagnosis not present

## 2021-02-18 DIAGNOSIS — I350 Nonrheumatic aortic (valve) stenosis: Secondary | ICD-10-CM | POA: Diagnosis not present

## 2021-02-18 DIAGNOSIS — R7989 Other specified abnormal findings of blood chemistry: Secondary | ICD-10-CM

## 2021-02-18 DIAGNOSIS — I351 Nonrheumatic aortic (valve) insufficiency: Secondary | ICD-10-CM | POA: Diagnosis not present

## 2021-02-18 DIAGNOSIS — D631 Anemia in chronic kidney disease: Secondary | ICD-10-CM | POA: Diagnosis not present

## 2021-02-18 LAB — CBC
HCT: 19 % — ABNORMAL LOW (ref 39.0–52.0)
Hemoglobin: 6.2 g/dL — CL (ref 13.0–17.0)
MCH: 34.6 pg — ABNORMAL HIGH (ref 26.0–34.0)
MCHC: 32.6 g/dL (ref 30.0–36.0)
MCV: 106.1 fL — ABNORMAL HIGH (ref 80.0–100.0)
Platelets: 102 10*3/uL — ABNORMAL LOW (ref 150–400)
RBC: 1.79 MIL/uL — ABNORMAL LOW (ref 4.22–5.81)
RDW: 17.7 % — ABNORMAL HIGH (ref 11.5–15.5)
WBC: 1.3 10*3/uL — CL (ref 4.0–10.5)
nRBC: 0 % (ref 0.0–0.2)

## 2021-02-18 LAB — PREPARE RBC (CROSSMATCH)

## 2021-02-18 LAB — BASIC METABOLIC PANEL
Anion gap: 9 (ref 5–15)
BUN: 63 mg/dL — ABNORMAL HIGH (ref 8–23)
CO2: 21 mmol/L — ABNORMAL LOW (ref 22–32)
Calcium: 9 mg/dL (ref 8.9–10.3)
Chloride: 108 mmol/L (ref 98–111)
Creatinine, Ser: 3.33 mg/dL — ABNORMAL HIGH (ref 0.61–1.24)
GFR, Estimated: 19 mL/min — ABNORMAL LOW (ref >60)
Glucose, Bld: 144 mg/dL — ABNORMAL HIGH (ref 70–99)
Potassium: 4.2 mmol/L (ref 3.5–5.1)
Sodium: 138 mmol/L (ref 135–145)

## 2021-02-18 LAB — HEMOGLOBIN AND HEMATOCRIT, BLOOD
HCT: 25 % — ABNORMAL LOW (ref 39.0–52.0)
Hemoglobin: 8.6 g/dL — ABNORMAL LOW (ref 13.0–17.0)

## 2021-02-18 LAB — HEMOGLOBIN A1C
Hgb A1c MFr Bld: 7.4 % — ABNORMAL HIGH (ref 4.8–5.6)
Mean Plasma Glucose: 165.68 mg/dL

## 2021-02-18 LAB — ECHOCARDIOGRAM COMPLETE
AR max vel: 2.05 cm2
AV Area VTI: 2.13 cm2
AV Area mean vel: 2 cm2
AV Mean grad: 12.8 mmHg
AV Peak grad: 21.5 mmHg
Ao pk vel: 2.32 m/s
Area-P 1/2: 2.66 cm2
S' Lateral: 3.4 cm

## 2021-02-18 LAB — GLUCOSE, CAPILLARY
Glucose-Capillary: 90 mg/dL (ref 70–99)
Glucose-Capillary: 129 mg/dL — ABNORMAL HIGH (ref 70–99)
Glucose-Capillary: 183 mg/dL — ABNORMAL HIGH (ref 70–99)

## 2021-02-18 LAB — LACTATE DEHYDROGENASE: LDH: 81 U/L — ABNORMAL LOW (ref 98–192)

## 2021-02-18 LAB — SAVE SMEAR(SSMR), FOR PROVIDER SLIDE REVIEW

## 2021-02-18 MED ORDER — TACROLIMUS 1 MG PO CAPS
1.0000 mg | ORAL_CAPSULE | Freq: Two times a day (BID) | ORAL | Status: DC
  Administered 2021-02-18 – 2021-02-21 (×7): 1 mg via ORAL
  Filled 2021-02-18 (×8): qty 1

## 2021-02-18 MED ORDER — SODIUM CHLORIDE 0.9% IV SOLUTION: Freq: Once | INTRAVENOUS | Status: AC

## 2021-02-18 NOTE — Consult Note (Signed)
Chief Complaint: Patient was seen in consultation today for pancytopenia/bone marrow biopsy and aspiration.  Referring Physician(s): Volanda Napoleon (hematology/oncology)  Supervising Physician: Sandi Mariscal  Patient Status: [facility] - In-pt  History of Present Illness: Russell Bowers is a 70 y.o. male with a past medical history of hypertension, HF, recurrent pneumonia, CKD s/p renal transplant 2012, prostate cancer s/p prostatectomy approximately 10 years ago, anemia, diabetes mellitus, gout, detached retina, OSA on BIPAPA, and ADHD. He presented to [facility] ED 02/17/2021 secondary to progressive dyspnea. In ED, labs revealed pancytopenia and positive troponins. He was admitted for further management. Hematology/oncology was consulted who recommended IR consult for possible bone marrow biopsy/aspiration for further evaluation.  IR consulted by Dr. Marin Olp for possible image-guided bone marrow biopsy/aspiration. Patient awake and alert sitting in bed watching TV. Complains of dyspnea on exertion, stable since admission. Denies dyspnea at rest. Denies fever, chills, chest pain, abdominal pain, or headache.   Past Medical History:  Diagnosis Date  . ADHD (attention deficit hyperactivity disorder)   . Allergic rhinitis   . Cancer [facility])    Prostate cancer  . Cellulitis and abscess of trunk 2014   history of back abscess  . Cellulitis and abscess of unspecified site 2014   lower back  . Detached retina   . Diabetes mellitus without complication ([place])   . Elevated troponin   . Fistula    OF LEFT ARM  . Gout   . H/O kidney transplant   . History of congestive heart failure    FOLLOWED BY DR. Marlou Porch   . History of recurrent pneumonia   . 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 Melvyn Novas  . Hypertension   . Mild concentric left ventricular hypertrophy (LVH)    AND AORTIC STENOSIS FOLLOWED IN THE  PAST BY CARDIOLOGIST DR. Marlou Porch.  . OSA treated with BiPAP   . Personal history of colonic polyps   . Prostate cancer ([place]) 11/2010   PROSTATECTOMY WITH UROLOGIST DR. DAVIS  . Renal failure   . Shortness of breath   . Sleep disorder breathing    FOLLOWED BY PULMONOLOGIST DR. Elsworth Soho    Past Surgical History:  Procedure Laterality Date  . CATARACT EXTRACTION, BILATERAL  11/2014  . COLONOSCOPY  09/2014  . KIDNEY TRANSPLANT  12/01/2011  . PROSTATECTOMY     FOR PROSTATE CANCER  . prostectomy  11/2010  . RETINAL DETACHMENT SURGERY  06/2002    Allergies: Pollen extract  Medications: Prior to Admission medications   Medication Sig Start Date End Date Taking? Authorizing Provider  acetaminophen (TYLENOL) 500 MG tablet Take 1,000 mg by mouth every 6 (six) hours as needed for moderate pain.   Yes [provider]  azaTHIOprine (IMURAN) 50 MG tablet Take 150 mg daily by mouth.   Yes [provider]  calcitRIOL (ROCALTROL) 0.25 MCG capsule Take 0.25 mcg by mouth daily. 11/08/20  Yes [provider]  cinacalcet (SENSIPAR) 30 MG tablet Take 30 mg by mouth daily.   Yes [provider]  famotidine (PEPCID) 20 MG tablet Take 20 mg by mouth daily.   Yes [provider]  furosemide (LASIX) 40 MG tablet Take 40 mg by mouth daily.   Yes [provider]  glipiZIDE (GLUCOTROL XL) 5 MG 24 hr tablet Take 5 mg by mouth 2 (two) times daily at 8 am and 10 pm. 02/13/21  Yes [provider]  insulin glargine (LANTUS) 100 UNIT/ML  injection Inject 15 Units into the skin at bedtime.   Yes [provider]  loperamide (IMODIUM) 2 MG capsule Take 2 mg by mouth as needed for diarrhea or loose stools.   Yes [provider]  loratadine (CLARITIN) 10 MG tablet Take 10 mg by mouth daily.   Yes [provider]  Magnesium Cl-Calcium Carbonate (SLOW-MAG PO) Take 1 tablet by mouth daily.    Yes [provider]  metoprolol succinate  (TOPROL-XL) 50 MG 24 hr tablet Take 0.5 tablets by mouth daily.    Yes [provider]  pravastatin (PRAVACHOL) 10 MG tablet Take 10 mg by mouth at bedtime.    Yes [provider]  Tacrolimus 0.5 MG CP24 Take 1 mg by mouth 2 (two) times daily. Taking 2 every am and 2 every pm   Yes [provider]  TRADJENTA 5 MG TABS tablet Take 5 mg by mouth daily. 12/01/20  Yes [provider]  vitamin B-12 (CYANOCOBALAMIN) 500 MCG tablet Take 500 mcg by mouth at bedtime.   Yes [provider]     Family History  Problem Relation Age of Onset  . Diabetes Mother   . Hypertension Mother   . Atrial fibrillation Mother   . Alzheimer's disease Father   . Heart Problems Sister   . Heart disease Maternal Grandfather   . Heart disease Maternal Grandmother     Social History   Socioeconomic History  . Marital status: Married    Spouse name: Not on file  . Number of children: Not on file  . Years of education: Not on file  . Highest education level: Not on file  Occupational History  . Occupation: Retired  Tobacco Use  . Smoking status: Former Smoker    Packs/day: 1.00    Years: 21.00    Pack years: 21.00    Types: Cigarettes    Quit date: 03/16/1993    Years since quitting: 27.9  . Smokeless tobacco: Never Used  Vaping Use  . Vaping Use: Never used  Substance and Sexual Activity  . Alcohol use: Yes    Alcohol/week: 1.0 - 2.0 standard drink    Types: 1 - 2 Glasses of wine per week  . Drug use: No  . Sexual activity: Not on file  Other Topics Concern  . Not on file  Social History Narrative  . Not on file   Social Determinants of Health   Financial Resource Strain: Not on file  Food Insecurity: Not on file  Transportation Needs: Not on file  Physical Activity: Not on file  Stress: Not on file  Social Connections: Not on file     Review of Systems: A 12 point ROS discussed and pertinent positives are indicated in the HPI above.  All other  systems are negative.  Review of Systems  Constitutional: Negative for chills and fever.  Respiratory: Positive for shortness of breath. Negative for wheezing.   Cardiovascular: Negative for chest pain and palpitations.  Gastrointestinal: Negative for abdominal pain.  Neurological: Negative for headaches.  Psychiatric/Behavioral: Negative for behavioral problems and confusion.    Vital Signs: BP 131/74 (BP Location: Right Arm)   Pulse 80   Temp 98.5 F (36.9 C) (Oral)   Resp 19   SpO2 99%   Physical Exam Vitals and nursing note reviewed.  Constitutional:      General: He is not in acute distress.    Appearance: Normal appearance.  Cardiovascular:     Rate and Rhythm:  Normal rate and regular rhythm.     Heart sounds: Normal heart sounds. No murmur heard.   Pulmonary:     Effort: Pulmonary effort is normal. No respiratory distress.     Breath sounds: Normal breath sounds. No wheezing.  Skin:    General: Skin is warm and dry.  Neurological:     Mental Status: He is alert and oriented to person, place, and time.      MD Evaluation Airway: WNL Heart: WNL Abdomen: WNL Chest/ Lungs: WNL ASA  Classification: 3 Mallampati/Airway Score: One   Imaging: DG Chest 2 View  Result Date: 02/17/2021 CLINICAL DATA:  Shortness of breath on exertion today. EXAM: CHEST - 2 VIEW COMPARISON:  Chest radiograph September 01, 2020 FINDINGS: The visible mediastinal contours are stable. Low lung volumes with subsegmental atelectasis, similar to prior. No pleural effusion. No focal consolidation. No pneumothorax. The visualized skeletal structures are unremarkable. IMPRESSION: No acute cardiopulmonary disease. Electronically Signed   By: Dahlia Bailiff MD   On: 02/17/2021 14:15    Labs:  CBC: Recent Labs    02/17/21 1353 02/18/21 0122  WBC 1.8* 1.3*  HGB 5.6* 6.2*  HCT 18.2* 19.0*  PLT 121* 102*    COAGS: Recent Labs    02/17/21 1423  INR 1.2    BMP: Recent Labs     02/17/21 1353 02/18/21 0122  NA 138 138  K 5.1 4.2  CL 108 108  CO2 23 21*  GLUCOSE 218* 144*  BUN 66* 63*  CALCIUM 9.4 9.0  CREATININE 3.39* 3.33*  GFRNONAA 19* 19*    LIVER FUNCTION TESTS: Recent Labs    02/17/21 1353  BILITOT 0.4  AST 11*  ALT 10  ALKPHOS 88  PROT 6.6  ALBUMIN 3.1*     Assessment and Plan:  Pancytopenia. Plan for image-guided bone marrow biopsy/apiration in IR tentatively for tomorrow 02/19/2021 pending IR scheduling. Patient will be NPO at midnight. Afebrile. CBC with differential ordered for tomorrow AM.  Risks and benefits discussed with the patient including, but not limited to bleeding, infection, damage to adjacent structures or low yield requiring additional tests. All of the patient's questions were answered, patient is agreeable to proceed. Consent signed and in IR control room.   Thank you for this interesting consult.  I greatly enjoyed meeting Russell Bowers and look forward to participating in their care.  A copy of this report was sent to the requesting provider on this date.  Electronically Signed: Earley Abide, PA-C 02/18/2021, 10:00 AM   I spent a total of 20 Minutes in face to face in clinical consultation, greater than 50% of which was counseling/coordinating care for pancytopenia/bone marrow biopsy and aspiration.

## 2021-02-18 NOTE — Progress Notes (Signed)
PROGRESS NOTE  [name]  DOB: April 22, 1951  PCP: Hulan Fess, MD ZOX:[id number]  DOA: 02/17/2021  LOS: 1 day   Chief Complaint  Patient presents with  . Shortness of Breath    Abnormal labs   Brief narrative: ALANMICHAEL BARMORE is a 70 y.o. male with PMH significant for DM2, HTN, CHF, OSA, IgA nephropathy status post renal transplantation at [facility] on 2012 on chronic immunosuppression with Imuran and Prograf, remote history of prostate cancer s/p post prostatectomy 10 years ago, follows up with oncology Dr. Marin Olp and nephrology at [facility]. Patient was sent to the ED after found to have profound anemia. Over the last 6 months, patient has had gradual decline in hemoglobin from a normal baseline to the lowest of 6.1 on the last blood work done at the nephrologist office. Patient complains of progressive generalized weakness, dyspnea worse on minimal exertion.  Labs in the ED showed hemoglobin low at 5.6, MCV elevated 116, very low reticulocyte count, leukocyte low at 1.8, platelet low at 121,000 BUN/creatinine elevated to 66/3.39. Iron saturation 88%, ferritin level high at 1500. He was admitted to hospitalist service for further evaluation management Oncology and nephrology consultation were obtained.  Subjective: Patient was seen and examined this morning. Pleasant elderly Caucasian male.  Lying on bed.  Not in distress.  Wife at bedside.  Assessment/Plan: Acute hypoplastic anemia -Per heme-onc note, patient has no history of anemia, never required transfusion had normal baseline hemoglobin. -Gradual decline in hemoglobin over the weeks with minimal 5.6 on 2/26 associated with elevated MCV and nonexistent reticulocyte count. -2 units of PRBC transfused afterwards hemoglobin rose to 6.2, not enough.  Will give one more unit of PRBC today. -Plan for bone marrow biopsy by IR tomorrow. Pending erythropoietin level. Recent Labs    02/17/21 1353 02/17/21 1355  02/17/21 1423 02/18/21 0122  HGB 5.6*  --   --  6.2*  MCV 115.9*  --   --  106.1*  VITAMINB12  --   --  256  --   FOLATE  --   --  6.2  --   FERRITIN  --   --  1,541*  --   TIBC  --   --  238*  --   IRON  --   --  209*  --   RETICCTPCT  --  <0.4*  --   --    Macrocytosis -MCV elevated to 116. -Folate level normal, vitamin B12 level low normal at 256.  Continue vitamin B12 supplement -Need to rule out myelodysplasia as well.  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  Currently with no evidence of infection.  No active bleeding. -Continue to monitor  History of IgA nephropathy s/p renal transplant in 2012 at [facility] - on immuno suppression -Continue Imuran and Prograf. -Follows up as an outpatient with [place] [facility].  Baseline creatinine less than 3 per patient.  Follows up with Dr. Posey Pronto as an outpatient. -Home meds include calcitriol, Sensipar, calcium supplement Recent Labs    02/17/21 1353 02/18/21 0122  BUN 66* 63*  CREATININE 3.39* 3.33*   Essential hypertension -Home meds include Toprol 25 mg daily, Lasix 40 mg daily. -Continue metoprolol.  I would keep Lasix on hold for now.  Type 2 diabetes mellitus -A1c 7.4 on 2/27 -Home meds include Lantus 15 units at bedtime, glipizide 5  mg twice daily and Tradjenta to 5 mg daily. -Blood sugar remains low.  Continue to hold all of them -Continue sliding scale insulin with Accu-Cheks. Recent Labs  Lab 02/17/21 1710 02/17/21 1923 02/18/21 0704  GLUCAP 148* 157* 90   Hyperlipidemia -Continue statin  Mobility: Independent prior to admission.  Encourage to ambulate on the hallway Code Status:   Code Status: Full Code  Nutritional status: There is no height or weight on file to calculate BMI.     Diet Order            Diet NPO  time specified Except for: Sips with Meds  Diet effective midnight           Diet renal/carb modified with fluid restriction Diet-HS Snack? Nothing; Fluid restriction: 1200 mL Fluid; Room service appropriate? Yes; Fluid consistency: Thin  Diet effective now                 DVT prophylaxis: SCDs Start: 02/17/21 1654   Antimicrobials:  None Fluid: None Consultants: Hematology, nephrology, IR Family Communication:  Wife at bedside  Status is: Inpatient  Remains inpatient appropriate because: Needs bone marrow biopsy, hemiglobin and creatinine monitoring Dispo: The patient is from: Home              Anticipated d/c is to: Home              Patient currently is not medically stable to d/c.   Difficult to place patient No       Infusions:    Scheduled Meds: . sodium chloride   Intravenous Once  . calcitRIOL  0.25 mcg Oral Daily  . cinacalcet  30 mg Oral Daily  . famotidine  20 mg Oral Daily  . insulin aspart  0-9 Units Subcutaneous TID WC  . loratadine  10 mg Oral Daily  . magnesium chloride  1 tablet Oral Daily  . metoprolol succinate  25 mg Oral Daily  . pravastatin  10 mg Oral QHS  . Tacrolimus  1 mg Oral BID  . vitamin B-12  1,000 mcg Oral QHS    Antimicrobials: Anti-infectives (From admission, onward)   None      PRN meds: acetaminophen, loperamide   Objective: Vitals:   02/18/21 0505 02/18/21 0900  BP: 109/62 131/74  Pulse: 73 80  Resp: 17 19  Temp: 98 F (36.7 C) 98.5 F (36.9 C)  SpO2: 99% 99%    Intake/Output Summary (Last 24 hours) at 02/18/2021 1128 Last data filed at 02/18/2021 0900 Gross per 24 hour  Intake 240 ml  Output 300 ml  Net -60 ml   There were no vitals filed for this visit. Weight change:  There is no height or weight on file to calculate BMI.   Physical Exam: General exam: Pleasant, elderly Caucasian male.  Not in distress Skin: No rashes, lesions or ulcers. HEENT: Atraumatic, normocephalic, no obvious bleeding Lungs:  Clear to auscultation bilaterally CVS: Regular rate and rhythm, no murmur GI/Abd soft, nontender, nondistended, bowel sound present CNS: Alert, awake, oriented x3 Psychiatry: Mood appropriate Extremities: Trace bilateral pedal edema  Data Review: I have personally reviewed the laboratory data and studies available.  Recent Labs  Lab 02/17/21 1353 02/18/21 0122  WBC 1.8* 1.3*  NEUTROABS 1.5*  --   HGB 5.6* 6.2*  HCT 18.2* 19.0*  MCV 115.9* 106.1*  PLT 121* 102*   Recent Labs  Lab 02/17/21 1353 02/18/21 0122  NA 138 138  K 5.1 4.2  CL 108  108  CO2 23 21*  GLUCOSE 218* 144*  BUN 66* 63*  CREATININE 3.39* 3.33*  CALCIUM 9.4 9.0    F/u labs ordered Unresulted Labs (From admission, onward)          Start     Ordered   02/19/21 6664  Basic metabolic panel  Daily,   R     Question:  Specimen collection method  Answer:  Lab=Lab collect   02/18/21 0829   02/19/21 0500  CBC with Differential/Platelet  Daily,   R     Question:  Specimen collection method  Answer:  Lab=Lab collect   02/18/21 0829   02/19/21 0500  Magnesium  Tomorrow morning,   STAT       Question:  Specimen collection method  Answer:  Lab=Lab collect   02/18/21 0829   02/19/21 0500  Phosphorus  Tomorrow morning,   R       Question:  Specimen collection method  Answer:  Lab=Lab collect   02/18/21 0829   02/18/21 1129  Prepare RBC (crossmatch)  (Adult Blood Administration - Red Blood Cells)  Once,   R       Question Answer Comment  # of Units 1 unit   Transfusion Indications Symptomatic Anemia   Number of Units to Keep Ahead NO units ahead   If emergent release call blood bank Not emergent release      02/18/21 1128   02/18/21 0500  Hemochromatosis DNA-PCR(c282y,h63d)  Tomorrow morning,   R        02/17/21 1838   02/17/21 1832  Tacrolimus level  Once,   R        02/17/21 1832   02/17/21 1720  Erythropoietin  Once,   STAT        02/17/21 1719          Signed, Terrilee Croak, MD [facility] 02/18/2021

## 2021-02-18 NOTE — Progress Notes (Signed)
Pt placed on bipap 12/6 via dream station for the night.

## 2021-02-18 NOTE — Progress Notes (Signed)
Lab called and notified this RN of critical hemoglobin of 6.2 and WBC of 1.3. Will notify treatment team.

## 2021-02-18 NOTE — Progress Notes (Signed)
  Echocardiogram 2D Echocardiogram has been performed.  Darlina Sicilian M 02/18/2021, 3:34 PM

## 2021-02-18 NOTE — Progress Notes (Signed)
Jeannette Corpus, NP paged back notifying this RN that she has seen critical hemoglobin and WBC.

## 2021-02-19 ENCOUNTER — Inpatient Hospital Stay (HOSPITAL_COMMUNITY): Payer: Medicare Other

## 2021-02-19 DIAGNOSIS — D631 Anemia in chronic kidney disease: Secondary | ICD-10-CM | POA: Diagnosis not present

## 2021-02-19 DIAGNOSIS — Z940 Kidney transplant status: Secondary | ICD-10-CM | POA: Diagnosis not present

## 2021-02-19 DIAGNOSIS — D696 Thrombocytopenia, unspecified: Secondary | ICD-10-CM | POA: Diagnosis not present

## 2021-02-19 DIAGNOSIS — N189 Chronic kidney disease, unspecified: Secondary | ICD-10-CM | POA: Diagnosis not present

## 2021-02-19 DIAGNOSIS — D61818 Other pancytopenia: Secondary | ICD-10-CM | POA: Diagnosis not present

## 2021-02-19 LAB — CBC WITH DIFFERENTIAL/PLATELET
Abs Immature Granulocytes: 0 10*3/uL (ref 0.00–0.07)
Basophils Absolute: 0 10*3/uL (ref 0.0–0.1)
Basophils Relative: 1 %
Eosinophils Absolute: 0.1 10*3/uL (ref 0.0–0.5)
Eosinophils Relative: 4 %
HCT: 21.7 % — ABNORMAL LOW (ref 39.0–52.0)
Hemoglobin: 7.4 g/dL — ABNORMAL LOW (ref 13.0–17.0)
Immature Granulocytes: 0 %
Lymphocytes Relative: 7 %
Lymphs Abs: 0.1 10*3/uL — ABNORMAL LOW (ref 0.7–4.0)
MCH: 35.6 pg — ABNORMAL HIGH (ref 26.0–34.0)
MCHC: 34.1 g/dL (ref 30.0–36.0)
MCV: 104.3 fL — ABNORMAL HIGH (ref 80.0–100.0)
Monocytes Absolute: 0.1 10*3/uL (ref 0.1–1.0)
Monocytes Relative: 10 %
Neutro Abs: 1.1 10*3/uL — ABNORMAL LOW (ref 1.7–7.7)
Neutrophils Relative %: 78 %
Platelets: 88 10*3/uL — ABNORMAL LOW (ref 150–400)
RBC: 2.08 MIL/uL — ABNORMAL LOW (ref 4.22–5.81)
RDW: 16.7 % — ABNORMAL HIGH (ref 11.5–15.5)
WBC: 1.4 10*3/uL — CL (ref 4.0–10.5)
nRBC: 0 % (ref 0.0–0.2)

## 2021-02-19 LAB — TYPE AND SCREEN
ABO/RH(D): O POS
Antibody Screen: NEGATIVE
Unit division: 0
Unit division: 0
Unit division: 0

## 2021-02-19 LAB — BPAM RBC
Blood Product Expiration Date: 202203252359
Blood Product Expiration Date: 202203312359
Blood Product Expiration Date: 202203312359
ISSUE DATE / TIME: 202202261606
ISSUE DATE / TIME: 202202261852
ISSUE DATE / TIME: 202202271251
Unit Type and Rh: 5100
Unit Type and Rh: 5100
Unit Type and Rh: 5100

## 2021-02-19 LAB — MAGNESIUM: Magnesium: 1.7 mg/dL (ref 1.7–2.4)

## 2021-02-19 LAB — BASIC METABOLIC PANEL
Anion gap: 9 (ref 5–15)
BUN: 63 mg/dL — ABNORMAL HIGH (ref 8–23)
CO2: 22 mmol/L (ref 22–32)
Calcium: 8.9 mg/dL (ref 8.9–10.3)
Chloride: 108 mmol/L (ref 98–111)
Creatinine, Ser: 3.31 mg/dL — ABNORMAL HIGH (ref 0.61–1.24)
GFR, Estimated: 19 mL/min — ABNORMAL LOW (ref >60)
Glucose, Bld: 173 mg/dL — ABNORMAL HIGH (ref 70–99)
Potassium: 4.5 mmol/L (ref 3.5–5.1)
Sodium: 139 mmol/L (ref 135–145)

## 2021-02-19 LAB — PHOSPHORUS: Phosphorus: 4.5 mg/dL (ref 2.5–4.6)

## 2021-02-19 LAB — ERYTHROPOIETIN: Erythropoietin: 1262 m[IU]/mL — ABNORMAL HIGH (ref 2.6–18.5)

## 2021-02-19 LAB — GLUCOSE, CAPILLARY
Glucose-Capillary: 162 mg/dL — ABNORMAL HIGH (ref 70–99)
Glucose-Capillary: 141 mg/dL — ABNORMAL HIGH (ref 70–99)
Glucose-Capillary: 145 mg/dL — ABNORMAL HIGH (ref 70–99)

## 2021-02-19 MED ORDER — FENTANYL CITRATE (PF) 100 MCG/2ML IJ SOLN
INTRAMUSCULAR | Status: AC | PRN
  Administered 2021-02-19: 25 ug via INTRAVENOUS

## 2021-02-19 MED ORDER — MIDAZOLAM HCL 2 MG/2ML IJ SOLN
INTRAMUSCULAR | Status: AC
  Filled 2021-02-19: qty 2

## 2021-02-19 MED ORDER — HYDROCODONE-ACETAMINOPHEN 5-325 MG PO TABS: 1.0000 | ORAL_TABLET | ORAL | Status: DC | PRN

## 2021-02-19 MED ORDER — FENTANYL CITRATE (PF) 100 MCG/2ML IJ SOLN
INTRAMUSCULAR | Status: AC
  Filled 2021-02-19: qty 2

## 2021-02-19 MED ORDER — TECHNETIUM TO 99M ALBUMIN AGGREGATED
4.0000 | Freq: Once | INTRAVENOUS | Status: AC | PRN
  Administered 2021-02-19: 4 via INTRAVENOUS

## 2021-02-19 MED ORDER — MIDAZOLAM HCL 2 MG/2ML IJ SOLN
INTRAMUSCULAR | Status: AC | PRN
  Administered 2021-02-19: 1 mg via INTRAVENOUS

## 2021-02-19 MED ORDER — LIDOCAINE HCL 1 % IJ SOLN
INTRAMUSCULAR | Status: AC
  Filled 2021-02-19: qty 20

## 2021-02-19 MED ORDER — SODIUM CHLORIDE 0.9 % IV SOLN
INTRAVENOUS | Status: AC | PRN
  Administered 2021-02-19: 250 mL via INTRAVENOUS

## 2021-02-19 MED ORDER — LIDOCAINE HCL 1 % IJ SOLN
INTRAMUSCULAR | Status: AC | PRN
  Administered 2021-02-19: 5 mL

## 2021-02-19 NOTE — Progress Notes (Signed)
Pt places self on/off bipap as needed.  RT will continue to monitor.

## 2021-02-19 NOTE — Progress Notes (Signed)
PROGRESS NOTE  Russell Bowers  DOB: [birth year]  PCP: Hulan Fess, MD WYO:[id number]  DOA: 02/17/2021  LOS: 2 days   Chief Complaint  Patient presents with  . Shortness of Breath    Abnormal labs   Brief narrative: Russell Bowers is a 70 y.o. male with PMH significant for DM2, HTN, CHF, OSA, IgA nephropathy status post renal transplantation at [facility] on 2012 on chronic immunosuppression with Imuran and Prograf, remote history of prostate cancer s/p post prostatectomy 10 years ago, follows up with oncology Dr. Marin Olp and nephrology at [facility]. Patient was sent to the ED after found to have profound anemia. Over the last 6 months, patient has had gradual decline in hemoglobin from a normal baseline to the lowest of 6.1 on the last blood work done at the nephrologist office. Patient complains of progressive generalized weakness, dyspnea worse on minimal exertion.  Labs in the ED showed hemoglobin low at 5.6, MCV elevated 116, very low reticulocyte count, leukocyte low at 1.8, platelet low at 121,000 BUN/creatinine elevated to 66/3.39. Iron saturation 88%, ferritin level high at 1500. He was admitted to hospitalist service for further evaluation management Oncology and nephrology consultation were obtained.  Subjective: Patient was seen and examined this morning.  Lying on bed.  Not conversive but no new symptoms. Pleasant elderly Caucasian male.  Lying on bed.  Not in distress.  Wife at bedside.  Assessment/Plan: Acute hypoplastic anemia -Per heme-onc note, patient has no history of anemia, never required transfusion had normal baseline hemoglobin. -Gradual decline in hemoglobin over the weeks with minimal 5.6 on 2/26 associated with elevated MCV and nonexistent reticulocyte count. -3 units of PRBC transfused with hemoglobin improvement to 8.6.  However there is a drop down to 7.4 in 12 hours already.  Repeat hemoglobin tomorrow.  May need further  transfusion. -Underwent bone marrow biopsy this morning. Recent Labs    02/17/21 1353 02/17/21 1355 02/17/21 1423 02/18/21 0122 02/18/21 1802 02/19/21 0136  HGB 5.6*  --   --  6.2* 8.6* 7.4*  MCV 115.9*  --   --  106.1*  --  104.3*  VITAMINB12  --   --  256  --   --   --   FOLATE  --   --  6.2  --   --   --   FERRITIN  --   --  1,541*  --   --   --   TIBC  --   --  238*  --   --   --   IRON  --   --  209*  --   --   --   RETICCTPCT  --  <0.4*  --   --   --   --    Macrocytosis -MCV elevated to 116. -Folate level normal, vitamin B12 level low normal at 256.  Continue vitamin B12 supplement -Need to rule out myelodysplasia as well.  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  Currently with no evidence of infection.  No active bleeding. -Continue to monitor  Acute kidney injury History of IgA nephropathy s/p renal transplant in 2012 at [facility] - on immuno suppression -Per patient, baseline creatinine is less than 3.  Presented with a creatinine elevated to 3.39.  Gradually improving.   -Continue immunosuppression with Imuran and Prograf. -Nephrology following -Home meds include  calcitriol, Sensipar, calcium supplement Recent Labs    02/17/21 1353 02/18/21 0122 02/19/21 0136  BUN 66* 63* 63*  CREATININE 3.39* 3.33* 3.31*   Essential hypertension -Home meds include Toprol 25 mg daily, Lasix 40 mg daily. -Continue metoprolol.  Continue to hold Lasix  Type 2 diabetes mellitus -A1c 7.4 on 2/27 -Home meds include Lantus 15 units at bedtime, glipizide 5 mg twice daily and Tradjenta to 5 mg daily. -Currently on hold.  blood sugar remains in the range. -Continue sliding scale insulin with Accu-Cheks. Recent Labs  Lab 02/17/21 1923 02/18/21 0704 02/18/21 1203 02/18/21 1730  02/19/21 0817  GLUCAP 157* 90 129* 183* 162*   Hyperlipidemia -Continue statin  Mobility: Independent prior to admission.  Encourage to ambulate on the hallway Code Status:   Code Status: Full Code  Nutritional status: There is no height or weight on file to calculate BMI.     Diet Order            Diet NPO time specified Except for: Sips with Meds  Diet effective midnight                 DVT prophylaxis: SCDs Start: 02/17/21 1654   Antimicrobials:  None Fluid: None Consultants: Hematology, nephrology, IR Family Communication:  Wife at bedside  Status is: Inpatient  Remains inpatient appropriate because: Needs bone marrow biopsy, hemiglobin and creatinine monitoring Dispo: The patient is from: Home              Anticipated d/c is to: Home              Patient currently is not medically stable to d/c.   Difficult to place patient No       Infusions:    Scheduled Meds: . calcitRIOL  0.25 mcg Oral Daily  . cinacalcet  30 mg Oral Daily  . famotidine  20 mg Oral Daily  . insulin aspart  0-9 Units Subcutaneous TID WC  . loratadine  10 mg Oral Daily  . magnesium chloride  1 tablet Oral Daily  . metoprolol succinate  25 mg Oral Daily  . pravastatin  10 mg Oral QHS  . tacrolimus  1 mg Oral BID  . vitamin B-12  1,000 mcg Oral QHS    Antimicrobials: Anti-infectives (From admission, onward)   None      PRN meds: acetaminophen, loperamide   Objective: Vitals:   02/19/21 1030 02/19/21 1045  BP: 140/66 125/60  Pulse: 68 68  Resp: (!) 26 19  Temp:    SpO2: 100% 94%    Intake/Output Summary (Last 24 hours) at 02/19/2021 1129 Last data filed at 02/19/2021 1030 Gross per 24 hour  Intake 470.25 ml  Output 100 ml  Net 370.25 ml   There were no vitals filed for this visit. Weight change:  There is no height or weight on file to calculate BMI.   Physical Exam: General exam: Pleasant, elderly Caucasian male. Not in distress Skin: No rashes, lesions or  ulcers. HEENT: Atraumatic, normocephalic, no obvious bleeding Lungs: Clear to auscultation bilaterally CVS: Regular rate and rhythm, no murmur GI/Abd soft, nontender, nondistended, bowel sound present CNS: Alert, awake, oriented x3 Psychiatry: Mood appropriate Extremities: No pedal edema, no calf tenderness  Data Review: I have personally reviewed the laboratory data and studies available.  Recent Labs  Lab 02/17/21 1353 02/18/21 0122 02/18/21 1802 02/19/21 0136  WBC 1.8* 1.3*  --  1.4*  NEUTROABS 1.5*  --   --  1.1*    HGB 5.6* 6.2* 8.6* 7.4*  HCT 18.2* 19.0* 25.0* 21.7*  MCV 115.9* 106.1*  --  104.3*  PLT 121* 102*  --  88*   Recent Labs  Lab 02/17/21 1353 02/18/21 0122 02/19/21 0136  NA 138 138 139  K 5.1 4.2 4.5  CL 108 108 108  CO2 23 21* 22  GLUCOSE 218* 144* 173*  BUN 66* 63* 63*  CREATININE 3.39* 3.33* 3.31*  CALCIUM 9.4 9.0 8.9  MG  --   --  1.7  PHOS  --   --  4.5    F/u labs ordered Unresulted Labs (From admission, onward)          Start     Ordered   02/20/21 0500  Reticulocytes  Daily,   R     Question:  Specimen collection method  Answer:  Lab=Lab collect   02/19/21 0726   02/19/21 0500  Basic metabolic panel  Daily,   R     Question:  Specimen collection method  Answer:  Lab=Lab collect   02/18/21 0829   02/19/21 0500  CBC with Differential/Platelet  Daily,   R     Question:  Specimen collection method  Answer:  Lab=Lab collect   02/18/21 0829   02/18/21 0500  Hemochromatosis DNA-PCR(c282y,h63d)  Tomorrow morning,   R        02/17/21 1838   02/17/21 1832  Tacrolimus level  Once,   R        02/17/21 1832   02/17/21 1720  Erythropoietin  Once,   STAT        02/17/21 1719          Signed,  , MD [facility] 02/19/2021            

## 2021-02-19 NOTE — Procedures (Signed)
  Procedure: CT bone marrow biopsy   EBL:   minimal Complications:  none immediate  See full dictation in [facility] PACS.  D. Anureet Bruington MD Main # [phone] Pager  [phone] Mobile [phone]     

## 2021-02-19 NOTE — Progress Notes (Signed)
Russell Bowers certainly looks better.  He feels better.  He is having a total of 4 units of blood.  Again, he does has a incredibly low reticulocyte count.  Hopefully he will have the bone marrow test done today.  I do not want to give him any ESA or G-CSF until after the bone marrow test is done.  His BUN is 63 creatinine 3.3.  His white cell count is 1.4.  Platelet count 88,000.  Again I just wonder if his pancytopenia is not from medications.  We really need to have the renal transplant team weigh in on his medications.  He does have a high blood sugar.  This is being monitored.  Again, I highly suspect that he will need ESA.  I will start this once he has the bone marrow test done.  He has had no problems with fever or obvious infections.  I will hold on the G-CSF for right now.  His appetite is doing okay.  He has had no nausea or vomiting.  There has been no diarrhea.  He has had no cough or shortness of breath.  We will continue to follow him along.  Again, we will have to see how his blood count trends.  I would had believe that the hemoglobin will continue to drop given that there is no measurable reticulocyte count when corrected for his hemoglobin.    Russell Haw, MD  Russell Bowers 41:10

## 2021-02-20 DIAGNOSIS — N189 Chronic kidney disease, unspecified: Secondary | ICD-10-CM | POA: Diagnosis not present

## 2021-02-20 DIAGNOSIS — D696 Thrombocytopenia, unspecified: Secondary | ICD-10-CM | POA: Diagnosis not present

## 2021-02-20 DIAGNOSIS — D61818 Other pancytopenia: Secondary | ICD-10-CM | POA: Diagnosis not present

## 2021-02-20 DIAGNOSIS — Z940 Kidney transplant status: Secondary | ICD-10-CM | POA: Diagnosis not present

## 2021-02-20 DIAGNOSIS — D631 Anemia in chronic kidney disease: Secondary | ICD-10-CM | POA: Diagnosis not present

## 2021-02-20 LAB — PATHOLOGIST SMEAR REVIEW

## 2021-02-20 LAB — RETICULOCYTES
Immature Retic Fract: 1.1 % — ABNORMAL LOW (ref 2.3–15.9)
RBC.: 2.2 MIL/uL — ABNORMAL LOW (ref 4.22–5.81)
Retic Count, Absolute: 6.2 10*3/uL — ABNORMAL LOW (ref 19.0–186.0)
Retic Ct Pct: <0.4 % — ABNORMAL LOW (ref 0.4–3.1)

## 2021-02-20 LAB — URINALYSIS, COMPLETE (UACMP) WITH MICROSCOPIC
Bilirubin Urine: NEGATIVE
Glucose, UA: 150 mg/dL — AB
Ketones, ur: NEGATIVE mg/dL
Nitrite: NEGATIVE
Protein, ur: 30 mg/dL — AB
Specific Gravity, Urine: 1.012 (ref 1.005–1.030)
WBC, UA: >50 WBC/hpf — ABNORMAL HIGH (ref 0–5)
pH: 6 (ref 5.0–8.0)

## 2021-02-20 LAB — BASIC METABOLIC PANEL
Anion gap: 9 (ref 5–15)
BUN: 65 mg/dL — ABNORMAL HIGH (ref 8–23)
CO2: 24 mmol/L (ref 22–32)
Calcium: 8.9 mg/dL (ref 8.9–10.3)
Chloride: 105 mmol/L (ref 98–111)
Creatinine, Ser: 3.54 mg/dL — ABNORMAL HIGH (ref 0.61–1.24)
GFR, Estimated: 18 mL/min — ABNORMAL LOW (ref >60)
Glucose, Bld: 205 mg/dL — ABNORMAL HIGH (ref 70–99)
Potassium: 4.3 mmol/L (ref 3.5–5.1)
Sodium: 138 mmol/L (ref 135–145)

## 2021-02-20 LAB — GLUCOSE, CAPILLARY
Glucose-Capillary: 195 mg/dL — ABNORMAL HIGH (ref 70–99)
Glucose-Capillary: 161 mg/dL — ABNORMAL HIGH (ref 70–99)
Glucose-Capillary: 113 mg/dL — ABNORMAL HIGH (ref 70–99)
Glucose-Capillary: 228 mg/dL — ABNORMAL HIGH (ref 70–99)
Glucose-Capillary: 149 mg/dL — ABNORMAL HIGH (ref 70–99)

## 2021-02-20 LAB — CBC WITH DIFFERENTIAL/PLATELET
Abs Immature Granulocytes: 0.01 10*3/uL (ref 0.00–0.07)
Basophils Absolute: 0 10*3/uL (ref 0.0–0.1)
Basophils Relative: 0 %
Eosinophils Absolute: 0.1 10*3/uL (ref 0.0–0.5)
Eosinophils Relative: 5 %
HCT: 23.3 % — ABNORMAL LOW (ref 39.0–52.0)
Hemoglobin: 7.8 g/dL — ABNORMAL LOW (ref 13.0–17.0)
Immature Granulocytes: 1 %
Lymphocytes Relative: 6 %
Lymphs Abs: 0.1 10*3/uL — ABNORMAL LOW (ref 0.7–4.0)
MCH: 35.5 pg — ABNORMAL HIGH (ref 26.0–34.0)
MCHC: 33.5 g/dL (ref 30.0–36.0)
MCV: 105.9 fL — ABNORMAL HIGH (ref 80.0–100.0)
Monocytes Absolute: 0.2 10*3/uL (ref 0.1–1.0)
Monocytes Relative: 14 %
Neutro Abs: 1 10*3/uL — ABNORMAL LOW (ref 1.7–7.7)
Neutrophils Relative %: 74 %
Platelets: 79 10*3/uL — ABNORMAL LOW (ref 150–400)
RBC: 2.2 MIL/uL — ABNORMAL LOW (ref 4.22–5.81)
RDW: 15.5 % (ref 11.5–15.5)
WBC: 1.3 10*3/uL — CL (ref 4.0–10.5)
nRBC: 0 % (ref 0.0–0.2)

## 2021-02-20 LAB — PROTEIN / CREATININE RATIO, URINE
Creatinine, Urine: 63.45 mg/dL
Protein Creatinine Ratio: 0.95 mg/mg{Cre} — ABNORMAL HIGH (ref 0.00–0.15)
Total Protein, Urine: 60 mg/dL

## 2021-02-20 LAB — TACROLIMUS LEVEL: Tacrolimus (FK506) - LabCorp: 4.9 ng/mL (ref 2.0–20.0)

## 2021-02-20 LAB — SODIUM, URINE, RANDOM: Sodium, Ur: 56 mmol/L

## 2021-02-20 NOTE — Progress Notes (Signed)
I think that we may have a real problem.  His erythropoietin level is incredibly high.  I am absolutely shocked by this.  His erythropoietin level is over 1200.  Again, I am absolutely surprised by this given the fact that he has renal insufficiency.  He has had bone marrow yesterday.  I have to believe that we are going to find something that is a primary hematologic issue.  I cannot imagine why the erythropoietin level would be so high unless he just has a very dysfunctional bone marrow right now.  His reticulocyte count is still nonexistent.  His white cell count is 1.3.  Hemoglobin 7.8.  Platelet count 79,000.  He has had no problems with bleeding.  There has been no fever.  Given the fact that he has a high iron saturation, I have to believe that he has a significant amount of dysfunctional erythropoiesis.  I think the bone marrow test will clearly be important for [place].  Given the incredibly high erythropoietin level, we we will not be able to use ESA on him.  He may need to be transfused on occasion.  If he does have myelodysplasia, which it certainly is more apparent at this time, we may have to consider treating this in view of the pancytopenia.  Again I am absolutely stunned by the high erythropoietin level.  I was really not expecting this.  Lattie Haw, MD  1 Corinthians 13:4

## 2021-02-20 NOTE — Progress Notes (Addendum)
PROGRESS NOTE  Russell Bowers  DOB: 1951/11/10  PCP: Hulan Fess, MD SKA:[id number]  DOA: 02/17/2021  LOS: 3 days   Chief Complaint  Patient presents with  . Shortness of Breath    Abnormal labs   Brief narrative: Russell Bowers is a 70 y.o. male with PMH significant for DM2, HTN, CHF, OSA, IgA nephropathy status post renal transplantation at [facility] on 2012 on chronic immunosuppression with Imuran and Prograf, remote history of prostate cancer s/p post prostatectomy 10 years ago, follows up with oncology Dr. Marin Olp and nephrology at [facility]. Patient was sent to the ED after found to have profound anemia. Over the last 6 months, patient has had gradual decline in hemoglobin from a normal baseline to the lowest of 6.1 on the last blood work done at the nephrologist office. Patient complains of progressive generalized weakness, dyspnea worse on minimal exertion.  Labs in the ED showed hemoglobin low at 5.6, MCV elevated 116, very low reticulocyte count, leukocyte low at 1.8, platelet low at 121,000 BUN/creatinine elevated to 66/3.39. Iron saturation 88%, ferritin level high at 1500. He was admitted to hospitalist service for further evaluation management Oncology and nephrology consultation were obtained.  Subjective: Patient was seen and examined this morning. Lying on bed.  Not in distress.  Wife at bedside.  We discussed about the elevated level of erythropoietin.  Patient is frustrated that he needs to wait longer in the hospital.  Assessment/Plan: Acute hypoplastic anemia -Patient reportedly has no history of anemia, and never required blood transfusion. -Found to have gradual decline in hemoglobin over the weeks with minimal 5.6 on 2/26 associated with elevated MCV and nonexistent reticulocyte count. -3 units of PRBC transfused so far.  Hemoglobin improved from 5.6 to 7.8.   -2/28, underwent bone marrow biopsy. -Erythropoietin level came elevated suggestive of  primary problem in the bone marrow Recent Labs    02/17/21 1353 02/17/21 1355 02/17/21 1423 02/18/21 0122 02/18/21 1802 02/19/21 0136 02/20/21 0203  HGB 5.6*  --   --  6.2* 8.6* 7.4* 7.8*  MCV 115.9*  --   --  106.1*  --  104.3* 105.9*  VITAMINB12  --   --  256  --   --   --   --   FOLATE  --   --  6.2  --   --   --   --   FERRITIN  --   --  1,541*  --   --   --   --   TIBC  --   --  238*  --   --   --   --   IRON  --   --  209*  --   --   --   --   RETICCTPCT  --  <0.4*  --   --   --   --  <0.4*   Macrocytosis -MCV elevated to 116. -Folate level normal, vitamin B12 level low normal at 256.  Continue vitamin B12 supplement -Need to rule out myelodysplasia as well.  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  Currently with no evidence of infection.  No active bleeding. -Continue to monitor  Acute kidney injury History of IgA nephropathy s/p renal transplant in 2012 at [facility] - on immuno suppression -Per patient, baseline creatinine is less than 3.  Presented with a  creatinine elevated to 3.39.   -Worse again this morning at 3.54.  Nephrology consult is called.  Follows up with Dr. Posey Pronto as an outpatient -Continue Prograf.  Imuran on hold. -Nephrology following -Home meds include calcitriol, Sensipar, calcium supplement Recent Labs    02/17/21 1353 02/18/21 0122 02/19/21 0136 02/20/21 0203  BUN 66* 63* 63* 65*  CREATININE 3.39* 3.33* 3.31* 3.54*   Essential hypertension -Home meds include Toprol 25 mg daily, Lasix 40 mg daily. -Continue metoprolol. Continue to hold Lasix -Echo with LVEF 60 to 65%, mild LVH, RV pressure overload with normal RV systolic function.  VQ scan negative.  Type 2 diabetes mellitus -A1c 7.4 on 2/27 -Home meds include Lantus 15 units at bedtime, glipizide  5 mg twice daily and Tradjenta to 5 mg daily. -Currently on hold.  blood sugar remains in the range. -Continue sliding scale insulin with Accu-Cheks. Recent Labs  Lab 02/19/21 1154 02/19/21 1645 02/20/21 0102 02/20/21 0632 02/20/21 1130  GLUCAP 141* 145* 195* 161* 113*   Hyperlipidemia -Continue statin  Mobility: Independent prior to admission.  Encourage to ambulate on the hallway Code Status:   Code Status: Full Code  Nutritional status: There is no height or weight on file to calculate BMI.     Diet Order            Diet renal/carb modified with fluid restriction Diet-HS Snack? Nothing; Fluid restriction: 1200 mL Fluid; Room service appropriate? Yes; Fluid consistency: Thin  Diet effective now                 DVT prophylaxis: SCDs Start: 02/17/21 1654   Antimicrobials:  None Fluid: None Consultants: Hematology, nephrology, IR Family Communication:  Wife at bedside  Status is: Inpatient  Remains inpatient appropriate because: Needs bone marrow biopsy, hemoglobin and creatinine monitoring Dispo: The patient is from: Home              Anticipated d/c is to: Home              Patient currently is not medically stable to d/c.   Difficult to place patient No   Infusions:    Scheduled Meds: . calcitRIOL  0.25 mcg Oral Daily  . cinacalcet  30 mg Oral Daily  . famotidine  20 mg Oral Daily  . insulin aspart  0-9 Units Subcutaneous TID WC  . loratadine  10 mg Oral Daily  . magnesium chloride  1 tablet Oral Daily  . metoprolol succinate  25 mg Oral Daily  . pravastatin  10 mg Oral QHS  . tacrolimus  1 mg Oral BID  . vitamin B-12  1,000 mcg Oral QHS    Antimicrobials: Anti-infectives (From admission, onward)   None      PRN meds: acetaminophen, HYDROcodone-acetaminophen, loperamide   Objective: Vitals:   02/20/21 0802 02/20/21 1315  BP: (!) 123/57 115/66  Pulse: 79 72  Resp: 16 17  Temp: 98.6 F (37 C) 98.5 F (36.9 C)  SpO2: 97% 98%     Intake/Output Summary (Last 24 hours) at 02/20/2021 1318 Last data filed at 02/19/2021 2100 Gross per 24 hour  Intake --  Output 200 ml  Net -200 ml   There were no vitals filed for this visit. Weight change:  There is no height or weight on file to calculate BMI.   Physical Exam: General exam: Pleasant, elderly Caucasian male. Not in distress Skin: No rashes, lesions or ulcers. HEENT: Atraumatic, normocephalic, no obvious bleeding Lungs: Clear to auscultation bilaterally  CVS: Regular rate and rhythm, no murmur GI/Abd soft, nontender, nondistended, bowel sound present CNS: Alert, awake, oriented x3 Psychiatry: Mood appropriate Extremities: No pedal edema, no calf tenderness  Data Review: I have personally reviewed the laboratory data and studies available.  Recent Labs  Lab 02/17/21 1353 02/18/21 0122 02/18/21 1802 02/19/21 0136 02/20/21 0203  WBC 1.8* 1.3*  --  1.4* 1.3*  NEUTROABS 1.5*  --   --  1.1* 1.0*  HGB 5.6* 6.2* 8.6* 7.4* 7.8*  HCT 18.2* 19.0* 25.0* 21.7* 23.3*  MCV 115.9* 106.1*  --  104.3* 105.9*  PLT 121* 102*  --  88* 79*   Recent Labs  Lab 02/17/21 1353 02/18/21 0122 02/19/21 0136 02/20/21 0203  NA 138 138 139 138  K 5.1 4.2 4.5 4.3  CL 108 108 108 105  CO2 23 21* 22 24  GLUCOSE 218* 144* 173* 205*  BUN 66* 63* 63* 65*  CREATININE 3.39* 3.33* 3.31* 3.54*  CALCIUM 9.4 9.0 8.9 8.9  MG  --   --  1.7  --   PHOS  --   --  4.5  --     F/u labs ordered Unresulted Labs (From admission, onward)          Start     Ordered   02/20/21 0500  Reticulocytes  Daily,   R     Question:  Specimen collection method  Answer:  Lab=Lab collect   02/19/21 0726   02/20/21 0203  Pathologist smear review  Once,   R        02/20/21 0203   02/19/21 6734  Basic metabolic panel  Daily,   R     Question:  Specimen collection method  Answer:  Lab=Lab collect   02/18/21 0829   02/19/21 0500  CBC with Differential/Platelet  Daily,   R     Question:  Specimen  collection method  Answer:  Lab=Lab collect   02/18/21 0829   02/18/21 0500  Hemochromatosis DNA-PCR(c282y,h63d)  Tomorrow morning,   R        02/17/21 1838          Signed, Terrilee Croak, MD [facility] 02/20/2021

## 2021-02-20 NOTE — Consult Note (Signed)
Reason for Consult: AKI/CKD stage IV Referring Physician: Pietro Cassis, MD  Russell Bowers is an 70 y.o. 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 Sofie Hartigan), CO2 retention and OSA (Dr. Elsworth Soho, diaphragm paralyzed), h/o pyuria (extensive w/u, unclear etiology), gout, HLD, and CKD stage IV (baseline SCr 2-2.8) who presented to [facility] ED with progressive shortness of breath and DOE.  He also complained of fatigue and generalized malaise.  In the ED his Hgb was noted to be 5.6 and was admitted for blood transfusion and further workup.  Other labs were notable for WBC of 1.8, platelets of 121, Na 138, K 5.1, BUN 66, Cr 3.39.  Hematology was consulted and had no schistocytes on smear nor any other clue to the source of his new onset of anemia (Hgb was 12.1 in August 2021 but dropped to 9.4 in November and December 2021).  TSAT 88% and high EPO level.  He underwent bone marrow biopsy 02/19/21 and results are pending.     We were consulted due to the development of progressive AKI/CKD stage IV.  The trend in Scr is seen below.  Of note, he had labs drawn on 02/13/21 at [facility] which revealed Hgb of 6.1 (was set up for blood transfusion as an outpatient this week) and Scr 3.17, K 5.8, BUN 70, Ca 9.8, Alb 3.7, Hgb A1c 7.3%,negative EBV/BK/CMV, and tacrolimus level was 3.7.  He denies any N/V/D, dysuria, pyuria, hematuria, urgency, frequency, or retention.   Trend in Creatinine: Creatinine, Ser  Date/Time Value Ref Range Status  02/20/2021 02:03 AM 3.54 (H) 0.61 - 1.24 mg/dL Final  02/19/2021 01:36 AM 3.31 (H) 0.61 - 1.24 mg/dL Final  02/18/2021 01:22 AM 3.33 (H) 0.61 - 1.24 mg/dL Final  02/17/2021 01:53 PM 3.39 (H) 0.61 - 1.24 mg/dL Final  08/10/2014 12:37 PM 2.03 (H) 0.50 - 1.35 mg/dL Final  03/23/2014 06:58 AM 2.10 (H) 0.50 - 1.35 mg/dL Final  03/22/2014  03:20 AM 2.04 (H) 0.50 - 1.35 mg/dL Final  03/21/2014 03:00 AM 1.59 (H) 0.50 - 1.35 mg/dL Final  03/20/2014 09:14 PM 1.61 (H) 0.50 - 1.35 mg/dL Final  03/20/2014 06:46 PM 1.52 (H) 0.50 - 1.35 mg/dL Final  04/20/2013 06:00 AM 2.32 (H) 0.50 - 1.35 mg/dL Final  04/19/2013 07:05 AM 2.13 (H) 0.50 - 1.35 mg/dL Final  04/18/2013 05:44 AM 1.94 (H) 0.50 - 1.35 mg/dL Final  04/17/2013 05:51 AM 1.99 (H) 0.50 - 1.35 mg/dL Final  04/16/2013 05:18 AM 2.16 (H) 0.50 - 1.35 mg/dL Final  04/15/2013 05:26 AM 2.08 (H) 0.50 - 1.35 mg/dL Final  04/14/2013 04:35 AM 2.17 (H) 0.50 - 1.35 mg/dL Final  04/13/2013 05:00 AM 2.29 (H) 0.50 - 1.35 mg/dL Final  04/12/2013 04:00 AM 2.68 (H) 0.50 - 1.35 mg/dL Final  04/11/2013 10:16 PM 2.66 (H) 0.50 - 1.35 mg/dL Final  04/11/2013 11:55 AM 2.72 (H) 0.50 - 1.35 mg/dL Final  03/11/2013 12:57 PM 1.82 (H) 0.50 - 1.35 mg/dL Final  01/16/2009 07:05 AM 10.98 (H) 0.4 - 1.5 mg/dL Final  01/14/2009 03:10 AM 5.78 (H) 0.4 - 1.5 mg/dL Final    PMH:   Past Medical History:  Diagnosis Date  . ADHD (attention deficit hyperactivity disorder)   . Allergic rhinitis   . Cancer [facility])    Prostate cancer  . Cellulitis and abscess of trunk 2014   history  of back abscess  . Cellulitis and abscess of unspecified site 2014   lower back  . Detached retina   . Diabetes mellitus without complication ([place])   . Elevated troponin   . Fistula    OF LEFT ARM  . Gout   . H/O kidney transplant   . History of congestive heart failure    FOLLOWED BY DR. Marlou Porch   . History of recurrent pneumonia   . 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 Melvyn Novas  . Hypertension   . Mild concentric left ventricular hypertrophy (LVH)    AND AORTIC STENOSIS FOLLOWED IN THE PAST BY CARDIOLOGIST DR. Marlou Porch.  . OSA treated with BiPAP   . Personal history of colonic polyps   . Prostate cancer ([place]) 11/2010   PROSTATECTOMY WITH  UROLOGIST DR. DAVIS  . Renal failure   . Shortness of breath   . Sleep disorder breathing    FOLLOWED BY PULMONOLOGIST DR. Elsworth Soho    PSH:   Past Surgical History:  Procedure Laterality Date  . CATARACT EXTRACTION, BILATERAL  11/2014  . COLONOSCOPY  09/2014  . KIDNEY TRANSPLANT  12/01/2011  . PROSTATECTOMY     FOR PROSTATE CANCER  . prostectomy  11/2010  . RETINAL DETACHMENT SURGERY  06/2002    Allergies:  Allergies  Allergen Reactions  . Pollen Extract Other (See Comments)    stuffy nose    Medications:   Prior to Admission medications   Medication Sig Start Date End Date Taking? Authorizing Provider  acetaminophen (TYLENOL) 500 MG tablet Take 1,000 mg by mouth every 6 (six) hours as needed for moderate pain.   Yes [provider]  azaTHIOprine (IMURAN) 50 MG tablet Take 150 mg daily by mouth.   Yes [provider]  calcitRIOL (ROCALTROL) 0.25 MCG capsule Take 0.25 mcg by mouth daily. 11/08/20  Yes [provider]  cinacalcet (SENSIPAR) 30 MG tablet Take 30 mg by mouth daily.   Yes [provider]  famotidine (PEPCID) 20 MG tablet Take 20 mg by mouth daily.   Yes [provider]  furosemide (LASIX) 40 MG tablet Take 40 mg by mouth daily.   Yes [provider]  glipiZIDE (GLUCOTROL XL) 5 MG 24 hr tablet Take 5 mg by mouth 2 (two) times daily at 8 am and 10 pm. 02/13/21  Yes [provider]  insulin glargine (LANTUS) 100 UNIT/ML injection Inject 15 Units into the skin at bedtime.   Yes [provider]  loperamide (IMODIUM) 2 MG capsule Take 2 mg by mouth as needed for diarrhea or loose stools.   Yes [provider]  loratadine (CLARITIN) 10 MG tablet Take 10 mg by mouth daily.   Yes [provider]  Magnesium Cl-Calcium Carbonate (SLOW-MAG PO) Take 1 tablet by mouth daily.    Yes [provider]  metoprolol succinate (TOPROL-XL) 50 MG 24 hr tablet Take 0.5 tablets by mouth daily.    Yes  [provider]  pravastatin (PRAVACHOL) 10 MG tablet Take 10 mg by mouth at bedtime.    Yes [provider]  tacrolimus (PROGRAF) 0.5 MG capsule Take 1 mg by mouth 2 (two) times daily. Reviewed bottle   Yes [provider]  TRADJENTA 5 MG TABS tablet Take 5 mg by mouth daily. 12/01/20  Yes [provider]  vitamin B-12 (CYANOCOBALAMIN) 500 MCG tablet Take 500 mcg by mouth at bedtime.   Yes [provider]  Inpatient medications: . calcitRIOL  0.25 mcg Oral Daily  . cinacalcet  30 mg Oral Daily  . famotidine  20 mg Oral Daily  . insulin aspart  0-9 Units Subcutaneous TID WC  . loratadine  10 mg Oral Daily  . magnesium chloride  1 tablet Oral Daily  . metoprolol succinate  25 mg Oral Daily  . pravastatin  10 mg Oral QHS  . tacrolimus  1 mg Oral BID  . vitamin B-12  1,000 mcg Oral QHS    Discontinued Meds:   Medications Discontinued During This Encounter  Medication Reason  . glimepiride (AMARYL) 1 MG tablet Patient Preference  . Loperamide-Simethicone (IMODIUM MULTI-SYMPTOM RELIEF) 7-322 MG TABS Duplicate  . tacrolimus (PROGRAF) 0.5 MG capsule Duplicate  . glipiZIDE (GLUCOTROL XL) 24 hr tablet 5 mg   . azaTHIOprine (IMURAN) tablet 150 mg   . vitamin B-12 (CYANOCOBALAMIN) tablet 500 mcg   . furosemide (LASIX) tablet 40 mg   . insulin glargine (LANTUS) injection 15 Units   . linagliptin (TRADJENTA) tablet 5 mg   . glipiZIDE (GLUCOTROL XL) 24 hr tablet 5 mg   . Tacrolimus CP24 1 mg   . Tacrolimus 0.5 MG CP24 Change in therapy    Social History:  reports that he quit smoking about 27 years ago. His smoking use included cigarettes. He has a 21.00 pack-year smoking history. He has never used smokeless tobacco. He reports current alcohol use of about 1.0 - 2.0 standard drink of alcohol per week. He reports that he does not use drugs.  Family History:   Family History  Problem Relation Age of Onset  . Diabetes Mother   . Hypertension  Mother   . Atrial fibrillation Mother   . Alzheimer's disease Father   . Heart Problems Sister   . Heart disease Maternal Grandfather   . Heart disease Maternal Grandmother     Pertinent items are noted in HPI. Weight change:   Intake/Output Summary (Last 24 hours) at 02/20/2021 1518 Last data filed at 02/19/2021 2100 Gross per 24 hour  Intake --  Output 200 ml  Net -200 ml   BP 115/66 (BP Location: Right Arm)   Pulse 72   Temp 98.5 F (36.9 C) (Oral)   Resp 17   SpO2 98%  Vitals:   02/19/21 2022 02/20/21 0545 02/20/21 0802 02/20/21 1315  BP:  120/62 (!) 123/57 115/66  Pulse: 80 82 79 72  Resp: _0 Temp:  98.4 F (36.9 C) 98.6 F (37 C) 98.5 F (36.9 C)  TempSrc:  Oral Oral Oral  SpO2: 100% 95% 97% 98%     General appearance: alert, cooperative and no distress Head: Normocephalic, without obvious abnormality, atraumatic Resp: clear to auscultation bilaterally Cardio: regular rate and rhythm, S1, S2 normal, no murmur, click, rub or gallop GI: soft, non-tender; bowel sounds normal; no masses,  no organomegaly and renal allograft in RLQ, nontender, no bruits Extremities: extremities normal, atraumatic, no cyanosis or edema and Left forearm AVF +T/B  Labs: Basic Metabolic Panel: Recent Labs  Lab 02/17/21 1353 02/18/21 0122 02/19/21 0136 02/20/21 0203  NA 138 138 139 138  K 5.1 4.2 4.5 4.3  CL 108 108 108 105  CO2 23 21* 22 24  GLUCOSE 218* 144* 173* 205*  BUN 66* 63* 63* 65*  CREATININE 3.39* 3.33* 3.31* 3.54*  ALBUMIN 3.1*  --   --   --   CALCIUM 9.4 9.0 8.9 8.9  PHOS  --   --  4.5  --    Liver Function Tests: Recent Labs  Lab 02/17/21 1353  AST 11*  ALT 10  ALKPHOS 88  BILITOT 0.4  PROT 6.6  ALBUMIN 3.1*   No results for input(s): LIPASE, AMYLASE in the last 168 hours. No results for input(s): AMMONIA in the last 168 hours. CBC: Recent Labs  Lab 02/17/21 1353 02/18/21 0122 02/18/21 1802 02/19/21 0136 02/20/21 0203  WBC 1.8* 1.3*   --  1.4* 1.3*  NEUTROABS 1.5*  --   --  1.1* 1.0*  HGB 5.6* 6.2* 8.6* 7.4* 7.8*  HCT 18.2* 19.0* 25.0* 21.7* 23.3*  MCV 115.9* 106.1*  --  104.3* 105.9*  PLT 121* 102*  --  88* 79*   PT/INR: _0 (inr:5) Cardiac Enzymes: )No results for input(s): CKTOTAL, CKMB, CKMBINDEX, TROPONINI in the last 168 hours. CBG: Recent Labs  Lab 02/19/21 1154 02/19/21 1645 02/20/21 0102 02/20/21 0632 02/20/21 1130  GLUCAP 141* 145* 195* 161* 113*    Iron Studies:  Recent Labs  Lab 02/17/21 1423  IRON 209*  TIBC 238*  FERRITIN 1,541*    Xrays/Other Studies: DG Chest 1 View  Result Date: 02/19/2021 CLINICAL DATA:  Shortness of breath. EXAM: CHEST  1 VIEW COMPARISON:  02/17/2021 FINDINGS: Low lung volumes are again noted, with mild bibasilar atelectasis, without significant change compared to prior exam. No evidence of pulmonary consolidation or pleural effusion. IMPRESSION: Stable low lung volumes and mild bibasilar atelectasis. Electronically Signed   By: Marlaine Hind M.D.   On: 02/19/2021 09:02   NM Pulmonary Perfusion  Result Date: 02/19/2021 CLINICAL DATA:  Post renal transplant for hypertension, type II diabetes mellitus, CHF, IgA nephropathy, post prostate cancer, anemia, generalized weakness, dyspnea on minimal exertion question pulmonary embolism EXAM: NUCLEAR MEDICINE PERFUSION LUNG SCAN TECHNIQUE: Perfusion images were obtained in multiple projections after intravenous injection of radiopharmaceutical. Ventilation scans intentionally deferred if perfusion scan and chest x-ray adequate for interpretation during COVID 19 epidemic. RADIOPHARMACEUTICALS:  4 mCi Tc-24mMAA IV COMPARISON:  None Correlation: Chest radiograph 02/19/2021 FINDINGS: Low lung volumes. No segmental or subsegmental perfusion defects. Minimal atelectasis RIGHT lung base. No scintigraphic evidence of pulmonary embolism. IMPRESSION: No scintigraphic evidence of pulmonary embolism. Electronically Signed   By: MLavonia DanaM.D.   On: 02/19/2021 16:19   CT BONE MARROW BIOPSY & ASPIRATION  Result Date: 02/19/2021 CLINICAL DATA:  Pancytopenia EXAM: CT GUIDED DEEP ILIAC BONE ASPIRATION AND CORE BIOPSY TECHNIQUE: Patient was placed prone on the CT gantry and limited axial scans through the pelvis were obtained. Appropriate skin entry site was identified. Skin site was marked, prepped with chlorhexidine, draped in usual sterile fashion, and infiltrated locally with 1% lidocaine. Intravenous Fentanyl 219m and Versed 76m76mere administered as conscious sedation during continuous monitoring of the patient's level of consciousness and physiological / cardiorespiratory status by the radiology RN, with a total moderate sedation time of 10 minutes. Under CT fluoroscopic guidance an 11-gauge Cook trocar bone needle was advanced into the left iliac bone just lateral to the sacroiliac joint. Once needle tip position was confirmed, core and aspiration samples were obtained, submitted to pathology for approval. Post procedure scans show no hematoma or fracture. Patient tolerated procedure well. COMPLICATIONS: COMPLICATIONS none IMPRESSION: 1. Technically successful CT guided left iliac bone core and aspiration biopsy. Electronically Signed   By: D  Lucrezia EuropeD.   On: 02/19/2021 15:38   ECHOCARDIOGRAM COMPLETE  Result Date: 02/18/2021    ECHOCARDIOGRAM REPORT   Patient Name:   ROBMAURION  D Herber Date of Exam: 02/18/2021 Medical Rec #:  [id number]          Height:       69.0 in Accession #:    [id number]         Weight:       181.8 lb Date of Birth:  07/11/1951           BSA:          1.984 m Patient Age:    69 years           BP:           121/70 mmHg Patient Gender: M                  HR:           82 bpm. Exam Location:  Inpatient Procedure: 2D Echo, Cardiac Doppler and Color Doppler Indications:    Elevated Troponin  History:        Patient has prior history of Echocardiogram examinations, most                 recent 11/25/2017. CHF; Risk  Factors:Diabetes, Hypertension,                 Sleep Apnea and Dyslipidemia. Post renal transplantation at                 [facility] on 2012. Anemia.  Sonographer:    Tiffany Cooper RDCS Referring Phys: [id number] PING T ZHANG IMPRESSIONS  1. Left ventricular ejection fraction, by estimation, is 60 to 65%. The left ventricle has normal function. The left ventricle has no regional wall motion abnormalities. There is mild left ventricular hypertrophy. Left ventricular diastolic parameters were normal.  2. There is flattening of the ventricular septum in systole consistent with RV pressure overload. . Right ventricular systolic function is normal. The right ventricular size is normal.  3. Left atrial size was moderately dilated.  4. The mitral valve is normal in structure. Trivial mitral valve regurgitation. No evidence of mitral stenosis.  5. The aortic valve has an indeterminant number of cusps. There is moderate calcification of the aortic valve. There is moderate thickening of the aortic valve. Aortic valve regurgitation is mild. Mild aortic valve stenosis. FINDINGS  Left Ventricle: Left ventricular ejection fraction, by estimation, is 60 to 65%. The left ventricle has normal function. The left ventricle has no regional wall motion abnormalities. The left ventricular internal cavity size was normal in size. There is  mild left ventricular hypertrophy. Left ventricular diastolic parameters were normal. Right Ventricle: There is flattening of the ventricular septum in systole consistent with RV pressure overload. The right ventricular size is normal. Right vetricular wall thickness was not well visualized. Right ventricular systolic function is normal. Left Atrium: Left atrial size was moderately dilated. Right Atrium: Right atrial size was normal in size. Pericardium: There is no evidence of pericardial effusion. Mitral Valve: The mitral valve is normal in structure. There is mild thickening of the mitral valve  leaflet(s). There is mild calcification of the mitral valve leaflet(s). Mild mitral annular calcification. Trivial mitral valve regurgitation. No evidence  of mitral valve stenosis. Tricuspid Valve: The tricuspid valve is not well visualized. Tricuspid valve regurgitation is not demonstrated. No evidence of tricuspid stenosis. Aortic Valve: The aortic valve has an indeterminant number of cusps. There is moderate calcification of the aortic valve. There is moderate thickening of the aortic valve. There is moderate aortic valve annular calcification. Aortic   valve regurgitation is mild. Mild aortic stenosis is present. Aortic valve mean gradient measures 12.8 mmHg. Aortic valve peak gradient measures 21.5 mmHg. Aortic valve area, by VTI measures 2.13 cm. Pulmonic Valve: The pulmonic valve was not well visualized. Pulmonic valve regurgitation is not visualized. No evidence of pulmonic stenosis. Aorta: The aortic root is normal in size and structure. Pulmonary Artery: Indeterminant PASP, inadequate TR jet. IAS/Shunts: No atrial level shunt detected by color flow Doppler.  LEFT VENTRICLE PLAX 2D LVIDd:         4.70 cm  Diastology LVIDs:         3.40 cm  LV e' medial:    9.57 cm/s LV PW:         1.10 cm  LV E/e' medial:  11.0 LV IVS:        1.10 cm  LV e' lateral:   12.00 cm/s LVOT diam:     2.30 cm  LV E/e' lateral: 8.8 LV SV:         96 LV SV Index:   49 LVOT Area:     4.15 cm  RIGHT VENTRICLE RV S prime:     12.90 cm/s TAPSE (M-mode): 1.8 cm LEFT ATRIUM             Index       RIGHT ATRIUM           Index LA diam:        5.00 cm 2.52 cm/m  RA Area:     12.90 cm LA Vol (A2C):   82.1 ml 41.38 ml/m RA Volume:   25.60 ml  12.90 ml/m LA Vol (A4C):   80.1 ml 40.37 ml/m LA Biplane Vol: 81.0 ml 40.83 ml/m  AORTIC VALVE AV Area (Vmax):    2.05 cm AV Area (Vmean):   2.00 cm AV Area (VTI):     2.13 cm AV Vmax:           232.05 cm/s AV Vmean:          167.778 cm/s AV VTI:            0.453 m AV Peak Grad:      21.5 mmHg  AV Mean Grad:      12.8 mmHg LVOT Vmax:         114.69 cm/s LVOT Vmean:        80.667 cm/s LVOT VTI:          0.232 m LVOT/AV VTI ratio: 0.51  AORTA Ao Root diam: 3.90 cm Ao Asc diam:  3.60 cm MITRAL VALVE MV Area (PHT): 2.66 cm     SHUNTS MV Decel Time: 285 msec     Systemic VTI:  0.23 m MV E velocity: 105.00 cm/s  Systemic Diam: 2.30 cm MV A velocity: 112.00 cm/s MV E/A ratio:  0.94 Carlyle Dolly MD Electronically signed by Carlyle Dolly MD Signature Date/Time: 02/18/2021/4:01:06 PM    Final      Assessment/Plan: 1.  AKI/CKD stage IV- his Scr has ranged from 2-2.8 over the past 2 years but now rising over the past few days.  Possibly his profound anemia is contributing and causing ischemic ATN, however has not yet started to see improvement.  His prograf level was therapeutic at 3.7.  No uremic symptoms.  Will continue to follow and recommend another blood transfusion if his Hgb continues to fall.  Will check urine studies and follow UOP and daily Scr.  May need to hold lasix for now given  rising BUN/Cr.  2. Pancytopenia - awaiting bone marrow biopsy.  Elevated EPO level and TSAT.  Negative peripheral blood smear.  Negative EBV/CMV/BK DNA PCR's prior to admission.  Hematology following and believes he may have myelodysplasia.  Awaiting bone marrow biopsy results. 3. S/p DDKT - imuran on hold due to pancytopenia.  Continue with tacrolimus for now. 4. CKD-MBD - on calcitriol, sensipar, and calcium supplement 5. HTN - stable on metoprolol 25 mg daily and lasix 40 mg daily.  6. DM type 2- per primary.   Russell Bowers 02/20/2021, 3:18 PM

## 2021-02-20 NOTE — Progress Notes (Signed)
Pt places self on/off bipap as needed.  RT will continue to monitor.

## 2021-02-21 ENCOUNTER — Encounter (HOSPITAL_COMMUNITY): Payer: Medicare Other

## 2021-02-21 DIAGNOSIS — D631 Anemia in chronic kidney disease: Secondary | ICD-10-CM | POA: Diagnosis not present

## 2021-02-21 DIAGNOSIS — N189 Chronic kidney disease, unspecified: Secondary | ICD-10-CM

## 2021-02-21 DIAGNOSIS — D61818 Other pancytopenia: Secondary | ICD-10-CM | POA: Diagnosis not present

## 2021-02-21 DIAGNOSIS — Z940 Kidney transplant status: Secondary | ICD-10-CM | POA: Diagnosis not present

## 2021-02-21 DIAGNOSIS — D696 Thrombocytopenia, unspecified: Secondary | ICD-10-CM | POA: Diagnosis not present

## 2021-02-21 LAB — CBC WITH DIFFERENTIAL/PLATELET
Abs Immature Granulocytes: 0.01 10*3/uL (ref 0.00–0.07)
Basophils Absolute: 0 10*3/uL (ref 0.0–0.1)
Basophils Relative: 0 %
Eosinophils Absolute: 0.1 10*3/uL (ref 0.0–0.5)
Eosinophils Relative: 4 %
HCT: 22.3 % — ABNORMAL LOW (ref 39.0–52.0)
Hemoglobin: 7.5 g/dL — ABNORMAL LOW (ref 13.0–17.0)
Immature Granulocytes: 1 %
Lymphocytes Relative: 7 %
Lymphs Abs: 0.1 10*3/uL — ABNORMAL LOW (ref 0.7–4.0)
MCH: 35.5 pg — ABNORMAL HIGH (ref 26.0–34.0)
MCHC: 33.6 g/dL (ref 30.0–36.0)
MCV: 105.7 fL — ABNORMAL HIGH (ref 80.0–100.0)
Monocytes Absolute: 0.2 10*3/uL (ref 0.1–1.0)
Monocytes Relative: 14 %
Neutro Abs: 0.9 10*3/uL — ABNORMAL LOW (ref 1.7–7.7)
Neutrophils Relative %: 74 %
Platelets: 74 10*3/uL — ABNORMAL LOW (ref 150–400)
RBC: 2.11 MIL/uL — ABNORMAL LOW (ref 4.22–5.81)
RDW: 14.9 % (ref 11.5–15.5)
WBC: 1.2 10*3/uL — CL (ref 4.0–10.5)
nRBC: 0 % (ref 0.0–0.2)

## 2021-02-21 LAB — RETICULOCYTES
Immature Retic Fract: 3.3 % (ref 2.3–15.9)
RBC.: 2.19 MIL/uL — ABNORMAL LOW (ref 4.22–5.81)
Retic Ct Pct: <0.4 % — ABNORMAL LOW (ref 0.4–3.1)

## 2021-02-21 LAB — BASIC METABOLIC PANEL
Anion gap: 7 (ref 5–15)
BUN: 59 mg/dL — ABNORMAL HIGH (ref 8–23)
CO2: 22 mmol/L (ref 22–32)
Calcium: 9.4 mg/dL (ref 8.9–10.3)
Chloride: 112 mmol/L — ABNORMAL HIGH (ref 98–111)
Creatinine, Ser: 3.3 mg/dL — ABNORMAL HIGH (ref 0.61–1.24)
GFR, Estimated: 19 mL/min — ABNORMAL LOW (ref >60)
Glucose, Bld: 193 mg/dL — ABNORMAL HIGH (ref 70–99)
Potassium: 5.4 mmol/L — ABNORMAL HIGH (ref 3.5–5.1)
Sodium: 141 mmol/L (ref 135–145)

## 2021-02-21 LAB — GLUCOSE, CAPILLARY
Glucose-Capillary: 170 mg/dL — ABNORMAL HIGH (ref 70–99)
Glucose-Capillary: 207 mg/dL — ABNORMAL HIGH (ref 70–99)

## 2021-02-21 LAB — SURGICAL PATHOLOGY

## 2021-02-21 LAB — PREPARE RBC (CROSSMATCH)

## 2021-02-21 MED ORDER — GLIPIZIDE ER 5 MG PO TB24: 5.0000 mg | ORAL_TABLET | Freq: Every day | ORAL | Status: DC

## 2021-02-21 MED ORDER — FUROSEMIDE 10 MG/ML IJ SOLN
20.0000 mg | Freq: Once | INTRAMUSCULAR | Status: AC
  Administered 2021-02-21: 20 mg via INTRAVENOUS
  Filled 2021-02-21: qty 2

## 2021-02-21 MED ORDER — SODIUM ZIRCONIUM CYCLOSILICATE 10 G PO PACK
10.0000 g | PACK | Freq: Three times a day (TID) | ORAL | Status: DC
  Administered 2021-02-21 (×2): 10 g via ORAL
  Filled 2021-02-21 (×2): qty 1

## 2021-02-21 MED ORDER — SODIUM CHLORIDE 0.9% IV SOLUTION: Freq: Once | INTRAVENOUS | Status: AC

## 2021-02-21 MED ORDER — INSULIN GLARGINE 100 UNIT/ML ~~LOC~~ SOLN: 10.0000 [IU] | Freq: Every day | SUBCUTANEOUS | 11 refills | Status: DC

## 2021-02-21 MED ORDER — FOSFOMYCIN TROMETHAMINE 3 G PO PACK: 3.0000 g | PACK | Freq: Once | ORAL | Status: DC

## 2021-02-21 MED ORDER — FOSFOMYCIN TROMETHAMINE 3 G PO PACK
3.0000 g | PACK | Freq: Once | ORAL | Status: AC
  Administered 2021-02-21: 3 g via ORAL
  Filled 2021-02-21: qty 3

## 2021-02-21 MED ORDER — FUROSEMIDE 40 MG PO TABS: 40.0000 mg | ORAL_TABLET | Freq: Every day | ORAL | Status: DC

## 2021-02-21 NOTE — Discharge Summary (Signed)
Physician Discharge Summary  Russell Bowers:[id number] DOB: 11-Mar-1951 DOA: 02/17/2021  PCP: Russell Fess, MD Hematologist: Dr. Marin Olp Nephrologist: Dr. Posey Pronto  Admit date: 02/17/2021 Discharge date: 02/21/2021  Admitted From:   Home  Disposition: Home   Recommendations for Outpatient Follow-up:  1. Follow up with Dr. Marin Olp in 1 week 2. Follow up with Nephrologist in 1-2 weeks  3. Please obtain BMP/CBC in one week  Discharge Condition: STABLE   CODE STATUS: FULL  DIET: renal/carb modified    Brief Hospitalization Summary: Please see all hospital notes, images, labs for full details of the hospitalization. ADMISSION HPI: Russell Bowers is a 70 y.o. male with medical history significant of kidney transplant on Imuran and Prograf, CKD stage IV, anemia secondary to CKD, remote history of prostate cancer that is post prostatectomy, IDDM, HTN, presented with increasing shortness of breath. Patient has had gradual decrease of hemoglobin, Hb was 12>9>6 over last 6 months, with most recent reading 6.1, 4 days ago at nephrologist office.  Gradually, patient has had worsening of exertional dyspnea, this week, only minimum walk can trigger significant shortness of breath, denies any chest pain no cough no fever chills.  Denies any abdominal pain, no black tarry stool or blood in the stool.  Patient also reported he has had normal iron study at nephrologist office "years". ED Course: Blood work showed pancytopenia with Hb 5.6, leukocyte 1.8, platelet 121. Reticulocyte count less than 2.4, iron saturation within normal limits.   Hospital Course  Acute hypoplastic anemia / Pancytopenia -Patient reportedly has no history of anemia, and never required blood transfusion. -Found to have gradual decline in hemoglobin over the weeks with minimal 5.6 on 2/26 associated with elevated MCV and nonexistent reticulocyte count. -3 units of PRBC transfused.  Hemoglobin improved from 5.6 to 7.5.  Transfused  1 unit PRBC 3/2 per Dr. Marin Olp.  -2/28, underwent bone marrow biopsy.  RESULTS still pending.  Pt to follow up results with Dr. Marin Olp outpatient.   -Erythropoietin level came elevated suggestive of primary problem in the bone marrow Recent Labs (within last 365 days)           Recent Labs    02/17/21 1353 02/17/21 1355 02/17/21 1423 02/18/21 0122 02/18/21 1802 02/19/21 0136 02/20/21 0203  HGB 5.6*  --   --  6.2* 8.6* 7.4* 7.8*  MCV 115.9*  --   --  106.1*  --  104.3* 105.9*  VITAMINB12  --   --  256  --   --   --   --   FOLATE  --   --  6.2  --   --   --   --   FERRITIN  --   --  1,541*  --   --   --   --   TIBC  --   --  238*  --   --   --   --   IRON  --   --  209*  --   --   --   --   RETICCTPCT  --  <0.4*  --   --   --   --  <0.4*     Macrocytosis -MCV elevated to 116. -Folate level normal, vitamin B12 level low normal at 256.  Continue vitamin B12 supplement -Need to rule out myelodysplasia as well.  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  Currently with no evidence of infection.  No active bleeding. -Continue to monitor outpatient with Dr. Marin Olp.   Acute kidney injury History of IgA nephropathy s/p renal transplant in 2012 at [facility] - on immuno suppression -Per patient, baseline creatinine is less than 3.  Presented with a creatinine elevated to 3.39.   -Worse again this morning at 3.54.  Nephrology consult is called.  Follows up with Dr. Posey Pronto as an outpatient -Continue Prograf.  Imuran on hold temporarily due to pancytopenia.  Discuss with Dr. Marin Olp and Dr. Posey Pronto if he can safely restart. -Nephrology followed in hospital. -Home meds include calcitriol, Sensipar, calcium supplement Recent Labs (within last 365  days)        Recent Labs    02/17/21 1353 02/18/21 0122 02/19/21 0136 02/20/21 0203  BUN 66* 63* 63* 65*  CREATININE 3.39* 3.33* 3.31* 3.54*     Essential hypertension -Home meds include Toprol 25 mg daily, Lasix 40 mg daily. -Continue metoprolol. Continue to hold Lasix -Echo with LVEF 60 to 65%, mild LVH, RV pressure overload with normal RV systolic function.  VQ scan negative.  Type 2 diabetes mellitus -A1c 7.4 on 2/27 -Home meds include Lantus 10 units at bedtime, glipizide 5 mg once daily and Tradjenta to 5 mg daily. -slowly restart with frequent blood glucose monitoring.  -Continue sliding scale insulin with Accu-Cheks. Last Labs          Recent Labs  Lab 02/19/21 1154 02/19/21 1645 02/20/21 0102 02/20/21 0632 02/20/21 1130  GLUCAP 141* 145* 195* 161* 113*     Hyperlipidemia -Continue statin  Mobility: Independent prior to admission.  Encourage to ambulate on the hallway Code Status:  Code Status: Full Code  Nutritional status: There is no height or weight on file to calculate BMI.    Diet Order                  Diet renal/carb modified with fluid restriction Diet-HS Snack? Nothing; Fluid restriction: 1200 mL Fluid; Room service appropriate? Yes; Fluid consistency: Thin  Diet effective now                  DVT prophylaxis: SCDs Start: 02/17/21 1654   Antimicrobials: fosfomycin x 1 dose Fluid: None Consultants: Hematology, nephrology, IR Family Communication: Wife at bedside  Discharge Diagnoses:  Active Problems:   Anemia due to chronic kidney disease   Pancytopenia [facility])  Discharge Instructions:  Allergies as of 02/21/2021      Reactions   Pollen Extract Other (See Comments)   stuffy nose      Medication List    STOP taking these medications   azaTHIOprine 50 MG tablet Commonly known as: IMURAN     TAKE these medications   acetaminophen 500 MG tablet Commonly known as: TYLENOL Take 1,000 mg by mouth every 6 (six)  hours as needed for moderate pain.   calcitRIOL 0.25 MCG capsule Commonly known as: ROCALTROL Take 0.25 mcg by mouth daily.   cinacalcet 30 MG tablet Commonly known as: SENSIPAR Take 30 mg by mouth daily.   famotidine 20 MG tablet Commonly known as: PEPCID Take 20 mg by mouth daily.   furosemide 40 MG tablet Commonly known as: LASIX Take 1 tablet (40 mg total) by mouth daily. Start taking on: February 22, 2021   glipiZIDE 5 MG 24 hr tablet Commonly known as: GLUCOTROL XL Take 1 tablet (  5 mg total) by mouth daily with breakfast. What changed: when to take this   insulin glargine 100 UNIT/ML injection Commonly known as: LANTUS Inject 0.1 mLs (10 Units total) into the skin at bedtime. What changed: how much to take   loperamide 2 MG capsule Commonly known as: IMODIUM Take 2 mg by mouth as needed for diarrhea or loose stools.   loratadine 10 MG tablet Commonly known as: CLARITIN Take 10 mg by mouth daily.   metoprolol succinate 50 MG 24 hr tablet Commonly known as: TOPROL-XL Take 0.5 tablets by mouth daily.   pravastatin 10 MG tablet Commonly known as: PRAVACHOL Take 10 mg by mouth at bedtime.   SLOW-MAG PO Take 1 tablet by mouth daily.   tacrolimus 0.5 MG capsule Commonly known as: PROGRAF Take 1 mg by mouth 2 (two) times daily. Reviewed bottle   Tradjenta 5 MG Tabs tablet Generic drug: linagliptin Take 5 mg by mouth daily.   vitamin B-12 500 MCG tablet Commonly known as: CYANOCOBALAMIN Take 500 mcg by mouth at bedtime.       Follow-up Information    Little, Lennette Bihari, MD. Schedule an appointment as soon as possible for a visit in 2 week(s).   Specialty: Family Medicine Contact information: [place] [address] [phone]        Volanda Napoleon, MD. Schedule an appointment as soon as possible for a visit in 1 week(s).   Specialty: Oncology Why: Hospital Follow Up  Contact information: [address] [place]  [place] [address] [phone]        Elmarie Shiley, MD. Schedule an appointment as soon as possible for a visit in 3 week(s).   Specialty: Nephrology Why: Hospital Follow Up  Contact information: [place] [address] [phone]              Allergies  Allergen Reactions  . Pollen Extract Other (See Comments)    stuffy nose   Allergies as of 02/21/2021      Reactions   Pollen Extract Other (See Comments)   stuffy nose      Medication List    STOP taking these medications   azaTHIOprine 50 MG tablet Commonly known as: IMURAN     TAKE these medications   acetaminophen 500 MG tablet Commonly known as: TYLENOL Take 1,000 mg by mouth every 6 (six) hours as needed for moderate pain.   calcitRIOL 0.25 MCG capsule Commonly known as: ROCALTROL Take 0.25 mcg by mouth daily.   cinacalcet 30 MG tablet Commonly known as: SENSIPAR Take 30 mg by mouth daily.   famotidine 20 MG tablet Commonly known as: PEPCID Take 20 mg by mouth daily.   furosemide 40 MG tablet Commonly known as: LASIX Take 1 tablet (40 mg total) by mouth daily. Start taking on: February 22, 2021   glipiZIDE 5 MG 24 hr tablet Commonly known as: GLUCOTROL XL Take 1 tablet (5 mg total) by mouth daily with breakfast. What changed: when to take this   insulin glargine 100 UNIT/ML injection Commonly known as: LANTUS Inject 0.1 mLs (10 Units total) into the skin at bedtime. What changed: how much to take   loperamide 2 MG capsule Commonly known as: IMODIUM Take 2 mg by mouth as needed for diarrhea or loose stools.   loratadine 10 MG tablet Commonly known as: CLARITIN Take 10 mg by mouth daily.   metoprolol succinate 50 MG 24 hr tablet Commonly known as: TOPROL-XL  Take 0.5 tablets by mouth daily.   pravastatin 10 MG tablet Commonly known as: PRAVACHOL Take 10 mg by mouth at bedtime.   SLOW-MAG PO Take 1 tablet by mouth daily.   tacrolimus 0.5 MG capsule Commonly known as: PROGRAF Take  1 mg by mouth 2 (two) times daily. Reviewed bottle   Tradjenta 5 MG Tabs tablet Generic drug: linagliptin Take 5 mg by mouth daily.   vitamin B-12 500 MCG tablet Commonly known as: CYANOCOBALAMIN Take 500 mcg by mouth at bedtime.       Procedures/Studies: DG Chest 1 View  Result Date: 02/19/2021 CLINICAL DATA:  Shortness of breath. EXAM: CHEST  1 VIEW COMPARISON:  02/17/2021 FINDINGS: Low lung volumes are again noted, with mild bibasilar atelectasis, without significant change compared to prior exam. No evidence of pulmonary consolidation or pleural effusion. IMPRESSION: Stable low lung volumes and mild bibasilar atelectasis. Electronically Signed   By: Marlaine Hind M.D.   On: 02/19/2021 09:02   DG Chest 2 View  Result Date: 02/17/2021 CLINICAL DATA:  Shortness of breath on exertion today. EXAM: CHEST - 2 VIEW COMPARISON:  Chest radiograph September 01, 2020 FINDINGS: The visible mediastinal contours are stable. Low lung volumes with subsegmental atelectasis, similar to prior. No pleural effusion. No focal consolidation. No pneumothorax. The visualized skeletal structures are unremarkable. IMPRESSION: No acute cardiopulmonary disease. Electronically Signed   By: Dahlia Bailiff MD   On: 02/17/2021 14:15   NM Pulmonary Perfusion  Result Date: 02/19/2021 CLINICAL DATA:  Post renal transplant for hypertension, type II diabetes mellitus, CHF, IgA nephropathy, post prostate cancer, anemia, generalized weakness, dyspnea on minimal exertion question pulmonary embolism EXAM: NUCLEAR MEDICINE PERFUSION LUNG SCAN TECHNIQUE: Perfusion images were obtained in multiple projections after intravenous injection of radiopharmaceutical. Ventilation scans intentionally deferred if perfusion scan and chest x-ray adequate for interpretation during COVID 19 epidemic. RADIOPHARMACEUTICALS:  4 mCi Tc-33mMAA IV COMPARISON:  None Correlation: Chest radiograph 02/19/2021 FINDINGS: Low lung volumes. No segmental or  subsegmental perfusion defects. Minimal atelectasis RIGHT lung base. No scintigraphic evidence of pulmonary embolism. IMPRESSION: No scintigraphic evidence of pulmonary embolism. Electronically Signed   By: MLavonia DanaM.D.   On: 02/19/2021 16:19   CT BONE MARROW BIOPSY & ASPIRATION  Result Date: 02/19/2021 CLINICAL DATA:  Pancytopenia EXAM: CT GUIDED DEEP ILIAC BONE ASPIRATION AND CORE BIOPSY TECHNIQUE: Patient was placed prone on the CT gantry and limited axial scans through the pelvis were obtained. Appropriate skin entry site was identified. Skin site was marked, prepped with chlorhexidine, draped in usual sterile fashion, and infiltrated locally with 1% lidocaine. Intravenous Fentanyl 263m and Versed 3m74mere administered as conscious sedation during continuous monitoring of the patient's level of consciousness and physiological / cardiorespiratory status by the radiology RN, with a total moderate sedation time of 10 minutes. Under CT fluoroscopic guidance an 11-gauge Cook trocar bone needle was advanced into the left iliac bone just lateral to the sacroiliac joint. Once needle tip position was confirmed, core and aspiration samples were obtained, submitted to pathology for approval. Post procedure scans show no hematoma or fracture. Patient tolerated procedure well. COMPLICATIONS: COMPLICATIONS none IMPRESSION: 1. Technically successful CT guided left iliac bone core and aspiration biopsy. Electronically Signed   By: D  Lucrezia EuropeD.   On: 02/19/2021 15:38   ECHOCARDIOGRAM COMPLETE  Result Date: 02/18/2021    ECHOCARDIOGRAM REPORT   Patient Name:   ROBYURIY CUIte of Exam: 02/18/2021 Medical Rec #:  [id number]  Height:       69.0 in Accession #:    [id number]         Weight:       181.8 lb Date of Birth:  Oct 22, 1951           BSA:          1.984 m Patient Age:    70 years           BP:           121/70 mmHg Patient Gender: M                  HR:           82 bpm. Exam Location:  Inpatient  Procedure: 2D Echo, Cardiac Doppler and Color Doppler Indications:    Elevated Troponin  History:        Patient has prior history of Echocardiogram examinations, most                 recent 11/25/2017. CHF; Risk Factors:Diabetes, Hypertension,                 Sleep Apnea and Dyslipidemia. Post renal transplantation at                 [facility] [place] on 2012. Anemia.  Sonographer:    Darlina Sicilian RDCS Referring Phys: [id number] Newman  1. Left ventricular ejection fraction, by estimation, is 60 to 65%. The left ventricle has normal function. The left ventricle has no regional wall motion abnormalities. There is mild left ventricular hypertrophy. Left ventricular diastolic parameters were normal.  2. There is flattening of the ventricular septum in systole consistent with RV pressure overload. . Right ventricular systolic function is normal. The right ventricular size is normal.  3. Left atrial size was moderately dilated.  4. The mitral valve is normal in structure. Trivial mitral valve regurgitation. No evidence of mitral stenosis.  5. The aortic valve has an indeterminant number of cusps. There is moderate calcification of the aortic valve. There is moderate thickening of the aortic valve. Aortic valve regurgitation is mild. Mild aortic valve stenosis. FINDINGS  Left Ventricle: Left ventricular ejection fraction, by estimation, is 60 to 65%. The left ventricle has normal function. The left ventricle has no regional wall motion abnormalities. The left ventricular internal cavity size was normal in size. There is  mild left ventricular hypertrophy. Left ventricular diastolic parameters were normal. Right Ventricle: There is flattening of the ventricular septum in systole consistent with RV pressure overload. The right ventricular size is normal. Right vetricular wall thickness was not well visualized. Right ventricular systolic function is normal. Left Atrium: Left atrial size was moderately dilated. Right  Atrium: Right atrial size was normal in size. Pericardium: There is no evidence of pericardial effusion. Mitral Valve: The mitral valve is normal in structure. There is mild thickening of the mitral valve leaflet(s). There is mild calcification of the mitral valve leaflet(s). Mild mitral annular calcification. Trivial mitral valve regurgitation. No evidence  of mitral valve stenosis. Tricuspid Valve: The tricuspid valve is not well visualized. Tricuspid valve regurgitation is not demonstrated. No evidence of tricuspid stenosis. Aortic Valve: The aortic valve has an indeterminant number of cusps. There is moderate calcification of the aortic valve. There is moderate thickening of the aortic valve. There is moderate aortic valve annular calcification. Aortic valve regurgitation is mild. Mild aortic stenosis is present. Aortic valve mean gradient measures 12.8 mmHg. Aortic valve peak gradient  measures 21.5 mmHg. Aortic valve area, by VTI measures 2.13 cm. Pulmonic Valve: The pulmonic valve was not well visualized. Pulmonic valve regurgitation is not visualized. No evidence of pulmonic stenosis. Aorta: The aortic root is normal in size and structure. Pulmonary Artery: Indeterminant PASP, inadequate TR jet. IAS/Shunts: No atrial level shunt detected by color flow Doppler.  LEFT VENTRICLE PLAX 2D LVIDd:         4.70 cm  Diastology LVIDs:         3.40 cm  LV e' medial:    9.57 cm/s LV PW:         1.10 cm  LV E/e' medial:  11.0 LV IVS:        1.10 cm  LV e' lateral:   12.00 cm/s LVOT diam:     2.30 cm  LV E/e' lateral: 8.8 LV SV:         96 LV SV Index:   49 LVOT Area:     4.15 cm  RIGHT VENTRICLE RV S prime:     12.90 cm/s TAPSE (M-mode): 1.8 cm LEFT ATRIUM             Index       RIGHT ATRIUM           Index LA diam:        5.00 cm 2.52 cm/m  RA Area:     12.90 cm LA Vol (A2C):   82.1 ml 41.38 ml/m RA Volume:   25.60 ml  12.90 ml/m LA Vol (A4C):   80.1 ml 40.37 ml/m LA Biplane Vol: 81.0 ml 40.83 ml/m  AORTIC VALVE  AV Area (Vmax):    2.05 cm AV Area (Vmean):   2.00 cm AV Area (VTI):     2.13 cm AV Vmax:           232.05 cm/s AV Vmean:          167.778 cm/s AV VTI:            0.453 m AV Peak Grad:      21.5 mmHg AV Mean Grad:      12.8 mmHg LVOT Vmax:         114.69 cm/s LVOT Vmean:        80.667 cm/s LVOT VTI:          0.232 m LVOT/AV VTI ratio: 0.51  AORTA Ao Root diam: 3.90 cm Ao Asc diam:  3.60 cm MITRAL VALVE MV Area (PHT): 2.66 cm     SHUNTS MV Decel Time: 285 msec     Systemic VTI:  0.23 m MV E velocity: 105.00 cm/s  Systemic Diam: 2.30 cm MV A velocity: 112.00 cm/s MV E/A ratio:  0.94 Carlyle Dolly MD Electronically signed by Carlyle Dolly MD Signature Date/Time: 02/18/2021/4:01:06 PM    Final      Subjective: Pt reports feeling well.  No specific complaints.  Wants to go home.  Will follow up with Dr. Marin Olp.    Discharge Exam: Vitals:   02/21/21 1041 02/21/21 1119  BP: 124/78 116/65  Pulse: 75 79  Resp: 17 16  Temp: 98.7 F (37.1 C) 98 F (36.7 C)  SpO2: 99% 97%   Vitals:   02/20/21 2008 02/21/21 0736 02/21/21 1041 02/21/21 1119  BP: 130/63 132/77 124/78 116/65  Pulse: 80 88 75 79  Resp:  _0 Temp: 98.2 F (36.8 C) 98.4 F (36.9 C) 98.7 F (37.1 C) 98 F (36.7 C)  TempSrc: Oral Oral  Oral Oral  SpO2: 100% 96% 99% 97%   General: Pt is alert, awake, not in acute distress Cardiovascular: normal S1/S2 +, no rubs, no gallops Respiratory: CTA bilaterally, no wheezing, no rhonchi Abdominal: Soft, NT, ND, bowel sounds + Extremities: no edema, no cyanosis   The results of significant diagnostics from this hospitalization (including imaging, microbiology, ancillary and laboratory) are listed below for reference.     Microbiology: Recent Results (from the past 240 hour(s))  Resp Panel by RT-PCR (Flu A&B, Covid) Nasopharyngeal Swab     Status: None   Collection Time: 02/17/21  6:05 PM   Specimen: Nasopharyngeal Swab; Nasopharyngeal(NP) swabs in vial transport medium  Result  Value Ref Range Status   SARS Coronavirus 2 by RT PCR NEGATIVE NEGATIVE Final    Comment: (NOTE) SARS-CoV-2 target nucleic acids are NOT DETECTED.  The SARS-CoV-2 RNA is generally detectable in upper respiratory specimens during the acute phase of infection. The lowest concentration of SARS-CoV-2 viral copies this assay can detect is 138 copies/mL. A negative result does not preclude SARS-Cov-2 infection and should not be used as the sole basis for treatment or other patient management decisions. A negative result may occur with  improper specimen collection/handling, submission of specimen other than nasopharyngeal swab, presence of viral mutation(s) within the areas targeted by this assay, and inadequate number of viral copies(<138 copies/mL). A negative result must be combined with clinical observations, patient history, and epidemiological information. The expected result is Negative.  Fact Sheet for Patients:  EntrepreneurPulse.com.au  Fact Sheet for Healthcare Providers:  IncredibleEmployment.be  This test is no t yet approved or cleared by the [place] FDA and  has been authorized for detection and/or diagnosis of SARS-CoV-2 by FDA under an Emergency Use Authorization (EUA). This EUA will remain  in effect (meaning this test can be used) for the duration of the COVID-19 declaration under Section 564(b)(1) of the Act, 21 U.S.C.section 360bbb-3(b)(1), unless the authorization is terminated  or revoked sooner.       Influenza A by PCR NEGATIVE NEGATIVE Final   Influenza B by PCR NEGATIVE NEGATIVE Final    Comment: (NOTE) The Xpert Xpress SARS-CoV-2/FLU/RSV plus assay is intended as an aid in the diagnosis of influenza from Nasopharyngeal swab specimens and should not be used as a sole basis for treatment. Nasal washings and aspirates are unacceptable for Xpert Xpress SARS-CoV-2/FLU/RSV testing.  Fact Sheet for  Patients: EntrepreneurPulse.com.au  Fact Sheet for Healthcare Providers: IncredibleEmployment.be  This test is not yet approved or cleared by the [place] FDA and has been authorized for detection and/or diagnosis of SARS-CoV-2 by FDA under an Emergency Use Authorization (EUA). This EUA will remain in effect (meaning this test can be used) for the duration of the COVID-19 declaration under Section 564(b)(1) of the Act, 21 U.S.C. section 360bbb-3(b)(1), unless the authorization is terminated or revoked.  Performed at [facility], [place] [address] [place], [place] [address]      Labs: BNP (last 3 results) No results for input(s): BNP in the last 8760 hours. Basic Metabolic Panel: Recent Labs  Lab 02/17/21 1353 02/18/21 0122 02/19/21 0136 02/20/21 0203 02/21/21 0453  NA 138 138 139 138 141  K 5.1 4.2 4.5 4.3 5.4*  CL 108 108 108 105 112*  CO2 23 21* _0 GLUCOSE 218* 144* 173* 205* 193*  BUN 66* 63* 63* 65* 59*  CREATININE 3.39* 3.33* 3.31* 3.54* 3.30*  CALCIUM 9.4 9.0 8.9 8.9 9.4  MG  --   --  1.7  --   --   PHOS  --   --  4.5  --   --    Liver Function Tests: Recent Labs  Lab 02/17/21 1353  AST 11*  ALT 10  ALKPHOS 88  BILITOT 0.4  PROT 6.6  ALBUMIN 3.1*   No results for input(s): LIPASE, AMYLASE in the last 168 hours. No results for input(s): AMMONIA in the last 168 hours. CBC: Recent Labs  Lab 02/17/21 1353 02/18/21 0122 02/18/21 1802 02/19/21 0136 02/20/21 0203 02/21/21 0453  WBC 1.8* 1.3*  --  1.4* 1.3* 1.2*  NEUTROABS 1.5*  --   --  1.1* 1.0* 0.9*  HGB 5.6* 6.2* 8.6* 7.4* 7.8* 7.5*  HCT 18.2* 19.0* 25.0* 21.7* 23.3* 22.3*  MCV 115.9* 106.1*  --  104.3* 105.9* 105.7*  PLT 121* 102*  --  88* 79* 74*   Cardiac Enzymes: No results for input(s): CKTOTAL, CKMB, CKMBINDEX, TROPONINI in the last 168 hours. BNP: Invalid input(s): POCBNP CBG: Recent Labs  Lab 02/20/21 0632 02/20/21 1130  02/20/21 1815 02/20/21 2244 02/21/21 0738  GLUCAP 161* 113* 228* 149* 170*   D-Dimer No results for input(s): DDIMER in the last 72 hours. Hgb A1c No results for input(s): HGBA1C in the last 72 hours. Lipid Profile No results for input(s): CHOL, HDL, LDLCALC, TRIG, CHOLHDL, LDLDIRECT in the last 72 hours. Thyroid function studies No results for input(s): TSH, T4TOTAL, T3FREE, THYROIDAB in the last 72 hours.  Invalid input(s): FREET3 Anemia work up Recent Labs    02/20/21 0203 02/21/21 0453  RETICCTPCT <0.4* <0.4*   Urinalysis    Component Value Date/Time   COLORURINE YELLOW 02/20/2021 1935   APPEARANCEUR HAZY (A) 02/20/2021 1935   LABSPEC 1.012 02/20/2021 1935   PHURINE 6.0 02/20/2021 1935   GLUCOSEU 150 (A) 02/20/2021 1935   HGBUR SMALL (A) 02/20/2021 1935   BILIRUBINUR NEGATIVE 02/20/2021 1935   KETONESUR NEGATIVE 02/20/2021 1935   PROTEINUR 30 (A) 02/20/2021 1935   UROBILINOGEN 0.2 08/10/2014 1844   NITRITE NEGATIVE 02/20/2021 1935   LEUKOCYTESUR LARGE (A) 02/20/2021 1935   Sepsis Labs Invalid input(s): PROCALCITONIN,  WBC,  LACTICIDVEN Microbiology Recent Results (from the past 240 hour(s))  Resp Panel by RT-PCR (Flu A&B, Covid) Nasopharyngeal Swab     Status: None   Collection Time: 02/17/21  6:05 PM   Specimen: Nasopharyngeal Swab; Nasopharyngeal(NP) swabs in vial transport medium  Result Value Ref Range Status   SARS Coronavirus 2 by RT PCR NEGATIVE NEGATIVE Final    Comment: (NOTE) SARS-CoV-2 target nucleic acids are NOT DETECTED.  The SARS-CoV-2 RNA is generally detectable in upper respiratory specimens during the acute phase of infection. The lowest concentration of SARS-CoV-2 viral copies this assay can detect is 138 copies/mL. A negative result does not preclude SARS-Cov-2 infection and should not be used as the sole basis for treatment or other patient management decisions. A negative result may occur with  improper specimen collection/handling,  submission of specimen other than nasopharyngeal swab, presence of viral mutation(s) within the areas targeted by this assay, and inadequate number of viral copies(<138 copies/mL). A negative result must be combined with clinical observations, patient history, and epidemiological information. The expected result is Negative.  Fact Sheet for Patients:  EntrepreneurPulse.com.au  Fact Sheet for Healthcare Providers:  IncredibleEmployment.be  This test is no t yet approved or cleared by the [place] FDA and  has been authorized for detection and/or diagnosis of SARS-CoV-2 by FDA under an Emergency Use Authorization (EUA). This  EUA will remain  in effect (meaning this test can be used) for the duration of the COVID-19 declaration under Section 564(b)(1) of the Act, 21 U.S.C.section 360bbb-3(b)(1), unless the authorization is terminated  or revoked sooner.       Influenza A by PCR NEGATIVE NEGATIVE Final   Influenza B by PCR NEGATIVE NEGATIVE Final    Comment: (NOTE) The Xpert Xpress SARS-CoV-2/FLU/RSV plus assay is intended as an aid in the diagnosis of influenza from Nasopharyngeal swab specimens and should not be used as a sole basis for treatment. Nasal washings and aspirates are unacceptable for Xpert Xpress SARS-CoV-2/FLU/RSV testing.  Fact Sheet for Patients: EntrepreneurPulse.com.au  Fact Sheet for Healthcare Providers: IncredibleEmployment.be  This test is not yet approved or cleared by the [place] FDA and has been authorized for detection and/or diagnosis of SARS-CoV-2 by FDA under an Emergency Use Authorization (EUA). This EUA will remain in effect (meaning this test can be used) for the duration of the COVID-19 declaration under Section 564(b)(1) of the Act, 21 U.S.C. section 360bbb-3(b)(1), unless the authorization is terminated or revoked.  Performed at [facility], [place] [address], [place], [place] [address]    Time coordinating discharge: 44 mins  SIGNED:  Irwin Brakeman, MD  [facility] 02/21/2021, 11:23 AM How to contact the [facility] Attending or Consulting provider [place] or covering provider during after hours [place], for this patient?  1. Check the care team in [facility] and look for a) attending/consulting [facility] provider listed and b) the [facility] team listed 2. Log into [email or web address] and use [place]'s universal password to access. If you do not have the password, please contact the hospital operator. 3. Locate the [facility] provider you are looking for under [facility] and page to a number that you can be directly reached. 4. If you still have difficulty reaching the provider, please page the [facility] (Director on Call) for the Hospitalists listed on amion for assistance.

## 2021-02-21 NOTE — Progress Notes (Signed)
Patient ID: Russell Bowers, male   DOB: 10/22/1951, 70 y.o.   MRN: [id number] S: Feels well, no complaints O:BP 116/65   Pulse 79   Temp 98 F (36.7 C) (Oral)   Resp 16   SpO2 97%  No intake or output data in the 24 hours ending 02/21/21 1305 Intake/Output: I/O last 3 completed shifts: In: -  Out: 200 [Urine:200]  Intake/Output this shift:  No intake/output data recorded. Weight change:  Gen: NAD CVS: RRR Resp: CTA Abd: +BS, soft, NT/ND Ext: no edema, LAVF +T/B  Recent Labs  Lab 02/17/21 1353 02/18/21 0122 02/19/21 0136 02/20/21 0203 02/21/21 0453  NA 138 138 139 138 141  K 5.1 4.2 4.5 4.3 5.4*  CL 108 108 108 105 112*  CO2 23 21* 22 24 22   GLUCOSE 218* 144* 173* 205* 193*  BUN 66* 63* 63* 65* 59*  CREATININE 3.39* 3.33* 3.31* 3.54* 3.30*  ALBUMIN 3.1*  --   --   --   --   CALCIUM 9.4 9.0 8.9 8.9 9.4  PHOS  --   --  4.5  --   --   AST 11*  --   --   --   --   ALT 10  --   --   --   --    Liver Function Tests: Recent Labs  Lab 02/17/21 1353  AST 11*  ALT 10  ALKPHOS 88  BILITOT 0.4  PROT 6.6  ALBUMIN 3.1*   No results for input(s): LIPASE, AMYLASE in the last 168 hours. No results for input(s): AMMONIA in the last 168 hours. CBC: Recent Labs  Lab 02/17/21 1353 02/18/21 0122 02/18/21 1802 02/19/21 0136 02/20/21 0203 02/21/21 0453  WBC 1.8* 1.3*  --  1.4* 1.3* 1.2*  NEUTROABS 1.5*  --   --  1.1* 1.0* 0.9*  HGB 5.6* 6.2*   < > 7.4* 7.8* 7.5*  HCT 18.2* 19.0*   < > 21.7* 23.3* 22.3*  MCV 115.9* 106.1*  --  104.3* 105.9* 105.7*  PLT 121* 102*  --  88* 79* 74*   < > = values in this interval not displayed.   Cardiac Enzymes: No results for input(s): CKTOTAL, CKMB, CKMBINDEX, TROPONINI in the last 168 hours. CBG: Recent Labs  Lab 02/20/21 1130 02/20/21 1815 02/20/21 2244 02/21/21 0738 02/21/21 1156  GLUCAP 113* 228* 149* 170* 207*    Iron Studies: No results for input(s): IRON, TIBC, TRANSFERRIN, FERRITIN in the last 72  hours. Studies/Results: NM Pulmonary Perfusion  Result Date: 02/19/2021 CLINICAL DATA:  Post renal transplant for hypertension, type II diabetes mellitus, CHF, IgA nephropathy, post prostate cancer, anemia, generalized weakness, dyspnea on minimal exertion question pulmonary embolism EXAM: NUCLEAR MEDICINE PERFUSION LUNG SCAN TECHNIQUE: Perfusion images were obtained in multiple projections after intravenous injection of radiopharmaceutical. Ventilation scans intentionally deferred if perfusion scan and chest x-ray adequate for interpretation during COVID 19 epidemic. RADIOPHARMACEUTICALS:  4 mCi Tc-74mMAA IV COMPARISON:  None Correlation: Chest radiograph 02/19/2021 FINDINGS: Low lung volumes. No segmental or subsegmental perfusion defects. Minimal atelectasis RIGHT lung base. No scintigraphic evidence of pulmonary embolism. IMPRESSION: No scintigraphic evidence of pulmonary embolism. Electronically Signed   By: MLavonia DanaM.D.   On: 02/19/2021 16:19   . sodium chloride   Intravenous Once  . calcitRIOL  0.25 mcg Oral Daily  . cinacalcet  30 mg Oral Daily  . famotidine  20 mg Oral Daily  . furosemide  20 mg Intravenous Once  . insulin  aspart  0-9 Units Subcutaneous TID WC  . loratadine  10 mg Oral Daily  . magnesium chloride  1 tablet Oral Daily  . metoprolol succinate  25 mg Oral Daily  . pravastatin  10 mg Oral QHS  . sodium zirconium cyclosilicate  10 g Oral TID  . tacrolimus  1 mg Oral BID  . vitamin B-12  1,000 mcg Oral QHS    BMET    Component Value Date/Time   NA 141 02/21/2021 0453   K 5.4 (H) 02/21/2021 0453   CL 112 (H) 02/21/2021 0453   CO2 22 02/21/2021 0453   GLUCOSE 193 (H) 02/21/2021 0453   BUN 59 (H) 02/21/2021 0453   CREATININE 3.30 (H) 02/21/2021 0453   CALCIUM 9.4 02/21/2021 0453   GFRNONAA 19 (L) 02/21/2021 0453   GFRAA 39 (L) 08/10/2014 1237   CBC    Component Value Date/Time   WBC 1.2 (LL) 02/21/2021 0453   RBC 2.11 (L) 02/21/2021 0453   RBC 2.19 (L)  02/21/2021 0453   HGB 7.5 (L) 02/21/2021 0453   HCT 22.3 (L) 02/21/2021 0453   PLT 74 (L) 02/21/2021 0453   MCV 105.7 (H) 02/21/2021 0453   MCH 35.5 (H) 02/21/2021 0453   MCHC 33.6 02/21/2021 0453   RDW 14.9 02/21/2021 0453   LYMPHSABS 0.1 (L) 02/21/2021 0453   MONOABS 0.2 02/21/2021 0453   EOSABS 0.1 02/21/2021 0453   BASOSABS 0.0 02/21/2021 0453    Assessment/Plan: 1.  AKI/CKD stage IV- his Scr has ranged from 2-2.8 over the past 2 years but now rising over the past few days.  Possibly his profound anemia is contributing and causing ischemic ATN, however has not yet started to see improvement.  His prograf level was therapeutic at 3.7.  No uremic symptoms.   1. Scr improved today 2. Plan for another blood transfusion and discharge to home afterwards 3. Will arrange for office follow up in the next 2 weeks 4. Low K diet 5. lokelma today for ^K 6. Ok to resume lasix.  2. Pancytopenia - awaiting bone marrow biopsy.  Elevated EPO level and TSAT.  Negative peripheral blood smear.  Negative EBV/CMV/BK DNA PCR's prior to admission.  Hematology following and believes he may have myelodysplasia.  Awaiting bone marrow biopsy results. 3. S/p DDKT - imuran on hold due to pancytopenia.  Continue with tacrolimus for now. 4. CKD-MBD - on calcitriol, sensipar, and calcium supplement 5. HTN - stable on metoprolol 25 mg daily and lasix 40 mg daily.  6. DM type 2- per primary.   Donetta Potts, MD [employer] [phone]

## 2021-02-21 NOTE — Discharge Instructions (Signed)
Aplastic Anemia  Aplastic anemia occurs when soft tissue inside of bones (bone marrow) stops making enough blood cells. Blood cells perform vital functions. There are three types of blood cells:  Red blood cells. These cells carry oxygen to the tissues of the body.  White blood cells. These cells fight infection.  Platelets. These cells help your blood to clot when you have an injury. Aplastic anemia is a rare and serious condition that may develop slowly or rapidly. Even after treatment, it is necessary to be monitored for problems that can come back (recur). What are the causes? This condition may be caused by anything that injures bone marrow, such as:  Radiation and chemotherapy treatment for cancer. These treatments are used to kill cancer cells, but they also damage healthy cells.  Exposure to poisonous (toxic) chemicals that are used in some pesticides and insecticides.  Certain medicines, such as medicines used to treat rheumatoid arthritis.  Conditions in which the body's disease-fighting system (immune system) attacks the body's own cells (autoimmune disorders).  Viral infections, including hepatitis.  Pregnancy. This is a rare cause of aplastic anemia, and it may be related to an autoimmune problem that affects bone marrow. Sometimes, the cause is not known. What are the signs or symptoms? Symptoms of this condition include:  Fatigue, light-headedness, or fainting.  Shortness of breath and rapid heart rate, especially with physical activity.  Pale skin, nails, and lips.  Frequent infections.  Bruising and bleeding easily. This may include: ? Nosebleeds, bleeding gums, and sore mouth. ? Prolonged bleeding from cuts. ? Severe bleeding during menstrual periods, for women.  Fever. How is this diagnosed? This condition is diagnosed based on your symptoms, medical history, and blood tests. You may also have a test where cells are removed from the inside of a bone  (bone marrow biopsy) for testing. You may have more tests to find the underlying cause of your aplastic anemia. How is this treated? Treatment for aplastic anemia depends on the severity of the condition. For mild cases, observation is needed. In severe cases, or if complications develop, hospitalization may be necessary. Severe aplastic anemia is life-threatening. Treatment may include:  Receiving donated blood through an IV (blood transfusion).  Medicines: ? To reduce the activity of (suppress) the immune system, if you have an autoimmune disorder. ? To stimulate bone marrow to make more blood cells.  Antibiotic medicines to prevent infection.  Receiving healthy bone marrow from a donor (bone marrow transplant), if your condition is severe. After the transplant, you will need to take medicines to help prevent your body from rejecting the new marrow. If the procedure is successful, the transplanted marrow will begin to produce new blood cells. Your health care provider will determine whether you are a candidate for bone marrow transplant.  Hormonal therapy to reduce menstruation-related bleeding.  A specialized diet to reduce the risk of infections. If told by your health care provider, you may need to: ? Avoid dairy products. ? Avoid raw meat. ? Wash fresh fruit and vegetables well and peel them before eating. Follow these instructions at home: Medicines  Take over-the-counter and prescription medicines only as told by your health care provider.  If you were prescribed an antibiotic medicine, take it as told by your health care provider. Do not stop taking the antibiotic even if you start to feel better. Activity  Avoid excessive exercise. Long-term anemia can put stress on the heart. Ask your health care provider what types of exercise are  best for you.  When platelets are at low levels, avoid all activities that put you at risk for injury. This is important because your risk of  bleeding is greater. Ask your health care provider what activities are safe for you when your platelets are low.  Avoid intense physical activities that could cause bleeding. General instructions  Get plenty of sleep.  Eat a healthy, well-balanced diet. Follow instructions from your health care provider about eating or drinking restrictions.  Protect yourself from infections. This includes: ? Washing your hands often with soap and water for at least 20 seconds. If soap and water are not available, use hand sanitizer. ? Avoiding crowds. ? Avoiding contact with sick people.  Keep all follow-up visits as told by your health care provider. This is important.   How is this prevented?  Avoid exposure to toxic chemicals.  Take steps to protect yourself from infections. Contact a health care provider if you:  Develop mouth sores.  Have a fever or other symptoms that last for more than 2-3 days.  Develop flu-like symptoms.  Feel you are bruising easily.  Develop signs of an infection.  Have blood in your urine or your stool (feces).  Have bleeding from your gums or nose.  Develop infections more often than usual. Get help right away if you:  Have a fever and your symptoms suddenly get worse.  Have prolonged bleeding from cuts.  Have shortness of breath that gets worse.  Have chest pain or a rapid heart rate when you do physical activity.  Have increasing fatigue and tiredness.  Become light-headed or you faint.  Develop pale skin and lips.  Cough up blood. These symptoms may represent a serious problem that is an emergency. Do not wait to see if the symptoms will go away. Get medical help right away. Call your local emergency services (911 in the [place] Do not drive yourself to the hospital. Summary  Aplastic anemia occurs when soft tissue inside the bones (bone marrow) stops making enough blood cells.  Treatment for aplastic anemia depends on the severity of the  condition, and it may involve medicines or transfusions. In severe cases, or if complications develop, hospitalization may be necessary.  Take steps to protect yourself from infections. This information is not intended to replace advice given to you by your health care provider. Make sure you discuss any questions you have with your health care provider. Document Revised: 09/28/2019 Document Reviewed: 09/28/2019 Elsevier Patient Education  [place].

## 2021-02-21 NOTE — Progress Notes (Signed)
1 unit of PRBCs given and completed. Pt is ambulating to the hall. No SOB, no chest pain. Discharge instructions given to the pt. Discharged to home accompanied by wife.

## 2021-02-21 NOTE — Progress Notes (Signed)
Again, I am absolutely shocked that the erythropoietin level is over 1200.  His reticulocyte count is nonexistent.  I have to believe that the most likely reason for this is underlying bone marrow failure.  I suppose there could be an outside chance that we could be looking at a medication effect from the Imuran.  Is hemoglobin is trending down.  Hemoglobin is 7.3.  I think we have to give him 1 unit of blood.  There is still no bone marrow report back yet.  It may be back today.  As far as treatment options for this pancytopenia, the bone marrow report will dictate what we can do.  I think that if he wants to go home today, which he does, I have no problems with this.  I can always follow-up with him as an outpatient.  His creatinine 3.3.  His potassium was 5.4.  His calcium is 9.4.  His white cell count is 1.2.  Platelet count 74,000.  These are both drifting downward.  He has had no fever.  As such, I do not feel inclined to give him a G-CSF.  There really is no change on his physical exam.  Again, we will give Russell Bowers 1 unit of blood today.  If the primary service wants to let him go home, that would be fine.  We may have the bone marrow report out today.  If not, then I would think tomorrow.  This is going to really dictate what our options will be for him.  I appreciate the wonderful care that he is got from all staff upon [address].  Lattie Haw, MD  Romans 1:16

## 2021-02-22 ENCOUNTER — Telehealth: Payer: Self-pay

## 2021-02-22 LAB — TYPE AND SCREEN
ABO/RH(D): O POS
Antibody Screen: NEGATIVE
Unit division: 0

## 2021-02-22 LAB — BPAM RBC
Blood Product Expiration Date: 202204012359
ISSUE DATE / TIME: 202203021053
Unit Type and Rh: 5100

## 2021-02-22 NOTE — Telephone Encounter (Signed)
Per inbasket message jamie s/w dr PE and advised that Bm Bx results need to be in before hjosp f/u appt cshould be made.  Pts wife advised that we will call as soon as we rec. Them and a f/u time is advised.   Anne.

## 2021-02-23 LAB — HEMOCHROMATOSIS DNA-PCR(C282Y,H63D)

## 2021-02-23 LAB — IMMUNOFIXATION, URINE

## 2021-02-27 ENCOUNTER — Encounter (HOSPITAL_COMMUNITY): Payer: Self-pay | Admitting: Hematology & Oncology

## 2021-02-28 DIAGNOSIS — I129 Hypertensive chronic kidney disease with stage 1 through stage 4 chronic kidney disease, or unspecified chronic kidney disease: Secondary | ICD-10-CM | POA: Diagnosis not present

## 2021-02-28 DIAGNOSIS — I5020 Unspecified systolic (congestive) heart failure: Secondary | ICD-10-CM | POA: Diagnosis not present

## 2021-02-28 DIAGNOSIS — E1122 Type 2 diabetes mellitus with diabetic chronic kidney disease: Secondary | ICD-10-CM | POA: Diagnosis not present

## 2021-02-28 DIAGNOSIS — N183 Chronic kidney disease, stage 3 unspecified: Secondary | ICD-10-CM | POA: Diagnosis not present

## 2021-02-28 DIAGNOSIS — E1121 Type 2 diabetes mellitus with diabetic nephropathy: Secondary | ICD-10-CM | POA: Diagnosis not present

## 2021-02-28 DIAGNOSIS — E782 Mixed hyperlipidemia: Secondary | ICD-10-CM | POA: Diagnosis not present

## 2021-02-28 DIAGNOSIS — N2581 Secondary hyperparathyroidism of renal origin: Secondary | ICD-10-CM | POA: Diagnosis not present

## 2021-03-01 ENCOUNTER — Other Ambulatory Visit: Payer: Self-pay | Admitting: *Deleted

## 2021-03-01 DIAGNOSIS — D61818 Other pancytopenia: Secondary | ICD-10-CM

## 2021-03-02 ENCOUNTER — Other Ambulatory Visit: Payer: Self-pay

## 2021-03-02 ENCOUNTER — Encounter: Payer: Self-pay | Admitting: Hematology & Oncology

## 2021-03-02 ENCOUNTER — Inpatient Hospital Stay: Payer: Medicare Other | Attending: Hematology & Oncology

## 2021-03-02 ENCOUNTER — Telehealth: Payer: Self-pay

## 2021-03-02 ENCOUNTER — Inpatient Hospital Stay: Payer: Medicare Other | Admitting: Hematology & Oncology

## 2021-03-02 VITALS — BP 133/60 | HR 85 | Temp 98.2°F | Resp 18 | Ht 69.0 in | Wt 170.0 lb

## 2021-03-02 DIAGNOSIS — N184 Chronic kidney disease, stage 4 (severe): Secondary | ICD-10-CM

## 2021-03-02 DIAGNOSIS — Z8546 Personal history of malignant neoplasm of prostate: Secondary | ICD-10-CM | POA: Insufficient documentation

## 2021-03-02 DIAGNOSIS — D61818 Other pancytopenia: Secondary | ICD-10-CM | POA: Diagnosis not present

## 2021-03-02 DIAGNOSIS — D696 Thrombocytopenia, unspecified: Secondary | ICD-10-CM | POA: Insufficient documentation

## 2021-03-02 DIAGNOSIS — D72819 Decreased white blood cell count, unspecified: Secondary | ICD-10-CM | POA: Insufficient documentation

## 2021-03-02 DIAGNOSIS — D631 Anemia in chronic kidney disease: Secondary | ICD-10-CM

## 2021-03-02 DIAGNOSIS — T8611 Kidney transplant rejection: Secondary | ICD-10-CM | POA: Diagnosis not present

## 2021-03-02 DIAGNOSIS — R5383 Other fatigue: Secondary | ICD-10-CM | POA: Diagnosis not present

## 2021-03-02 DIAGNOSIS — Z79899 Other long term (current) drug therapy: Secondary | ICD-10-CM | POA: Insufficient documentation

## 2021-03-02 DIAGNOSIS — N028 Recurrent and persistent hematuria with other morphologic changes: Secondary | ICD-10-CM | POA: Diagnosis not present

## 2021-03-02 DIAGNOSIS — Z940 Kidney transplant status: Secondary | ICD-10-CM | POA: Diagnosis not present

## 2021-03-02 LAB — CBC WITH DIFFERENTIAL (CANCER CENTER ONLY)
Abs Immature Granulocytes: 0 10*3/uL (ref 0.00–0.07)
Basophils Absolute: 0 10*3/uL (ref 0.0–0.1)
Basophils Relative: 1 %
Eosinophils Absolute: 0.1 10*3/uL (ref 0.0–0.5)
Eosinophils Relative: 3 %
HCT: 26.6 % — ABNORMAL LOW (ref 39.0–52.0)
Hemoglobin: 8.7 g/dL — ABNORMAL LOW (ref 13.0–17.0)
Immature Granulocytes: 0 %
Lymphocytes Relative: 6 %
Lymphs Abs: 0.1 10*3/uL — ABNORMAL LOW (ref 0.7–4.0)
MCH: 34.1 pg — ABNORMAL HIGH (ref 26.0–34.0)
MCHC: 32.7 g/dL (ref 30.0–36.0)
MCV: 104.3 fL — ABNORMAL HIGH (ref 80.0–100.0)
Monocytes Absolute: 0.5 10*3/uL (ref 0.1–1.0)
Monocytes Relative: 27 %
Neutro Abs: 1.2 10*3/uL — ABNORMAL LOW (ref 1.7–7.7)
Neutrophils Relative %: 63 %
Platelet Count: 132 10*3/uL — ABNORMAL LOW (ref 150–400)
RBC: 2.55 MIL/uL — ABNORMAL LOW (ref 4.22–5.81)
RDW: 16 % — ABNORMAL HIGH (ref 11.5–15.5)
WBC Count: 1.9 10*3/uL — ABNORMAL LOW (ref 4.0–10.5)
nRBC: 0 % (ref 0.0–0.2)

## 2021-03-02 LAB — CMP (CANCER CENTER ONLY)
ALT: 9 U/L (ref 0–44)
AST: 8 U/L — ABNORMAL LOW (ref 15–41)
Albumin: 3.8 g/dL (ref 3.5–5.0)
Alkaline Phosphatase: 102 U/L (ref 38–126)
Anion gap: 8 (ref 5–15)
BUN: 59 mg/dL — ABNORMAL HIGH (ref 8–23)
CO2: 25 mmol/L (ref 22–32)
Calcium: 9.6 mg/dL (ref 8.9–10.3)
Chloride: 104 mmol/L (ref 98–111)
Creatinine: 2.95 mg/dL — ABNORMAL HIGH (ref 0.61–1.24)
GFR, Estimated: 22 mL/min — ABNORMAL LOW (ref >60)
Glucose, Bld: 219 mg/dL — ABNORMAL HIGH (ref 70–99)
Potassium: 4.4 mmol/L (ref 3.5–5.1)
Sodium: 137 mmol/L (ref 135–145)
Total Bilirubin: 0.4 mg/dL (ref 0.3–1.2)
Total Protein: 6.9 g/dL (ref 6.5–8.1)

## 2021-03-02 LAB — RETICULOCYTES
Immature Retic Fract: 27.1 % — ABNORMAL HIGH (ref 2.3–15.9)
RBC.: 2.52 MIL/uL — ABNORMAL LOW (ref 4.22–5.81)
Retic Count, Absolute: 40.6 10*3/uL (ref 19.0–186.0)
Retic Ct Pct: 1.6 % (ref 0.4–3.1)

## 2021-03-02 LAB — SAVE SMEAR(SSMR), FOR PROVIDER SLIDE REVIEW

## 2021-03-02 LAB — SAMPLE TO BLOOD BANK

## 2021-03-02 MED ORDER — FOLIC ACID 1 MG PO TABS: 2.0000 mg | ORAL_TABLET | Freq: Every day | ORAL | 12 refills | Status: DC

## 2021-03-02 NOTE — Telephone Encounter (Signed)
S/w pt per 03/02/21 los and he is aware of his appts   Russell Bowers

## 2021-03-02 NOTE — Progress Notes (Signed)
Hematology and Oncology Follow Up Visit  ZORAWAR STROLLO [id number] 1951-06-11 70 y.o. 03/02/2021   Principle Diagnosis:   Pancytopenia likely secondary to Imuran for renal transplant rejection  Current Therapy:    Observation     Interim History:  Mr. Nedeau is in the office for his first office visit.  I saw him in consultation at [facility] couple weeks ago.  He presented with marked anemia and to some degree leukopenia and thrombocytopenia.  He has history of IgA nephropathy.  He underwent a kidney transplant at [facility] in 2012.  He has been on immunosuppressant therapy.  He has a history of prostate cancer 10 years ago.  He never had any radiation therapy.  He was in the emergency room on 02/17/2021.  He had marked anemia.  His hemoglobin was 5.2.  His platelet count 120,000.  His white cell count is 1.8.  We did ultimately get a bone marrow biopsy on him.  This was done on 02/19/2021.  The pathology report ([id number]) showed a variably cellular marrow with trilineage hematopoiesis.  There is no obvious dyspoietic changes that look suspicious.  Looks like there may have been changes consistent with bone marrow recovery.  The cytogenetics on the bone marrow were also normal.  I initially felt that this was secondary to his Imuran.  He has been off Imuran.  I also thought that he would have a very low erythropoietin level.  Surprisingly, his erythropoietin level was over 1260.  He was transfused in the hospital.  I thought that we would just stop the Imuran and see how his marrow would recover on its own.  He feels a lot better.  His "color" is better.  He has more energy.  He has more stamina.  His appetite is also better.  He has had no fever.  He has had no obvious change in bowel or bladder habits.  He is still urinating.  I think he sees a nephrologist in a couple weeks.  Overall, I would say his performance status is ECOG 1.   Medications:  Current  Outpatient Medications:  .  folic acid (FOLVITE) 1 MG tablet, Take 2 tablets (2 mg total) by mouth daily., Disp: 60 tablet, Rfl: 12 .  acetaminophen (TYLENOL) 500 MG tablet, Take 1,000 mg by mouth every 6 (six) hours as needed for moderate pain., Disp: , Rfl:  .  calcitRIOL (ROCALTROL) 0.25 MCG capsule, Take 0.25 mcg by mouth daily., Disp: , Rfl:  .  cinacalcet (SENSIPAR) 30 MG tablet, Take 30 mg by mouth daily., Disp: , Rfl:  .  famotidine (PEPCID) 20 MG tablet, Take 20 mg by mouth daily., Disp: , Rfl:  .  furosemide (LASIX) 40 MG tablet, Take 1 tablet (40 mg total) by mouth daily., Disp: 30 tablet, Rfl:  .  glipiZIDE (GLUCOTROL XL) 5 MG 24 hr tablet, Take 1 tablet (5 mg total) by mouth daily with breakfast., Disp: , Rfl:  .  insulin glargine (LANTUS) 100 UNIT/ML injection, Inject 0.1 mLs (10 Units total) into the skin at bedtime., Disp: 10 mL, Rfl: 11 .  loperamide (IMODIUM) 2 MG capsule, Take 2 mg by mouth as needed for diarrhea or loose stools., Disp: , Rfl:  .  loratadine (CLARITIN) 10 MG tablet, Take 10 mg by mouth daily., Disp: , Rfl:  .  Magnesium Cl-Calcium Carbonate (SLOW-MAG PO), Take 1 tablet by mouth daily. , Disp: , Rfl:  .  magnesium oxide (MAG-OX) 400 MG tablet, Take 1 tablet by  mouth daily., Disp: , Rfl:  .  metoprolol succinate (TOPROL-XL) 50 MG 24 hr tablet, Take 0.5 tablets by mouth daily. , Disp: , Rfl:  .  pravastatin (PRAVACHOL) 10 MG tablet, Take 10 mg by mouth at bedtime. , Disp: , Rfl:  .  tacrolimus (PROGRAF) 0.5 MG capsule, Take 1 mg by mouth 2 (two) times daily. Reviewed bottle, Disp: , Rfl:  .  TRADJENTA 5 MG TABS tablet, Take 5 mg by mouth daily., Disp: , Rfl:  .  vitamin B-12 (CYANOCOBALAMIN) 500 MCG tablet, Take 500 mcg by mouth at bedtime., Disp: , Rfl:   Allergies:  Allergies  Allergen Reactions  . Pollen Extract Other (See Comments)    stuffy nose    Past Medical History, Surgical history, Social history, and Family History were reviewed and  updated.  Review of Systems: Review of Systems  Constitutional: Positive for fatigue.  HENT:  Negative.   Eyes: Negative.   Respiratory: Negative.   Cardiovascular: Negative.   Gastrointestinal: Negative.   Endocrine: Negative.   Genitourinary: Negative.    Musculoskeletal: Negative.   Skin: Negative.   Neurological: Negative.   Hematological: Negative.   Psychiatric/Behavioral: Negative.     Physical Exam:  height is _0  (1.753 m) and weight is 170 lb (77.1 kg). His oral temperature is 98.2 F (36.8 C). His blood pressure is 133/60 and his pulse is 85. His respiration is 18 and oxygen saturation is 100%.   Wt Readings from Last 3 Encounters:  03/02/21 170 lb (77.1 kg)  09/26/20 181 lb 12.8 oz (82.5 kg)  02/19/19 192 lb 12.8 oz (87.5 kg)    Physical Exam Vitals reviewed.  HENT:     Head: Normocephalic and atraumatic.  Eyes:     Pupils: Pupils are equal, round, and reactive to light.  Cardiovascular:     Rate and Rhythm: Normal rate and regular rhythm.     Heart sounds: Normal heart sounds.  Pulmonary:     Effort: Pulmonary effort is normal.     Breath sounds: Normal breath sounds.  Abdominal:     General: Bowel sounds are normal.     Palpations: Abdomen is soft.  Musculoskeletal:        General: No tenderness or deformity. Normal range of motion.     Cervical back: Normal range of motion.  Lymphadenopathy:     Cervical: No cervical adenopathy.  Skin:    General: Skin is warm and dry.     Findings: No erythema or rash.  Neurological:     Mental Status: He is alert and oriented to person, place, and time.  Psychiatric:        Behavior: Behavior normal.        Thought Content: Thought content normal.        Judgment: Judgment normal.    Lab Results  Component Value Date   WBC 1.9 (L) 03/02/2021   HGB 8.7 (L) 03/02/2021   HCT 26.6 (L) 03/02/2021   MCV 104.3 (H) 03/02/2021   PLT 132 (L) 03/02/2021     Chemistry      Component Value Date/Time   NA  137 03/02/2021 1314   K 4.4 03/02/2021 1314   CL 104 03/02/2021 1314   CO2 25 03/02/2021 1314   BUN 59 (H) 03/02/2021 1314   CREATININE 2.95 (H) 03/02/2021 1314      Component Value Date/Time   CALCIUM 9.6 03/02/2021 1314   ALKPHOS 102 03/02/2021 1314   AST 8 (L) 03/02/2021  1314   ALT 9 03/02/2021 1314   BILITOT 0.4 03/02/2021 1314      Impression and Plan: Mr. Petter is a very nice 70 year old white male.  He has a history of IgA nephropathy.  He underwent a renal transplant in 2012.  Again, I have to believe that the Imuran is the cause of his pancytopenia.  We will be interesting to see what his erythropoietin level is now.  I just think we have to do his watch him.  I do think that maybe adding folic acid to his regimen would not be a bad idea.  I am glad that his quality life is doing better.  I would like to see him back in about a month.  Hopefully, we will see his blood counts improving.  It was fun talking to him and his wife.  It is funny that both are from [employer] [place] [employer], but 1 is from [place], [place] and one is from [place], [place].   Volanda Napoleon, MD 3/11/20225:31 PM

## 2021-03-03 LAB — ERYTHROPOIETIN: Erythropoietin: 135 m[IU]/mL — ABNORMAL HIGH (ref 2.6–18.5)

## 2021-03-05 LAB — FERRITIN: Ferritin: 2194 ng/mL — ABNORMAL HIGH (ref 24–336)

## 2021-03-05 LAB — IRON AND TIBC
Iron: 41 ug/dL — ABNORMAL LOW (ref 42–163)
Saturation Ratios: 18 % — ABNORMAL LOW (ref 20–55)
TIBC: 221 ug/dL (ref 202–409)
UIBC: 181 ug/dL (ref 117–376)

## 2021-03-08 ENCOUNTER — Telehealth: Payer: Self-pay | Admitting: *Deleted

## 2021-03-08 DIAGNOSIS — Z09 Encounter for follow-up examination after completed treatment for conditions other than malignant neoplasm: Secondary | ICD-10-CM | POA: Diagnosis not present

## 2021-03-08 DIAGNOSIS — D7589 Other specified diseases of blood and blood-forming organs: Secondary | ICD-10-CM | POA: Diagnosis not present

## 2021-03-08 DIAGNOSIS — Z794 Long term (current) use of insulin: Secondary | ICD-10-CM | POA: Diagnosis not present

## 2021-03-08 DIAGNOSIS — D649 Anemia, unspecified: Secondary | ICD-10-CM | POA: Diagnosis not present

## 2021-03-08 DIAGNOSIS — D61818 Other pancytopenia: Secondary | ICD-10-CM | POA: Diagnosis not present

## 2021-03-08 NOTE — Telephone Encounter (Signed)
Call received from patient requesting that lab results from 03/02/21 be sent to his PCP, Dr. Lavina Hamman.  Lab results routed to Dr. Lavina Hamman per pt.'s request.

## 2021-03-09 ENCOUNTER — Ambulatory Visit: Payer: Medicare Other | Admitting: Cardiology

## 2021-03-16 DIAGNOSIS — Z940 Kidney transplant status: Secondary | ICD-10-CM | POA: Diagnosis not present

## 2021-03-16 DIAGNOSIS — N390 Urinary tract infection, site not specified: Secondary | ICD-10-CM | POA: Diagnosis not present

## 2021-03-16 DIAGNOSIS — N184 Chronic kidney disease, stage 4 (severe): Secondary | ICD-10-CM | POA: Diagnosis not present

## 2021-03-16 DIAGNOSIS — I129 Hypertensive chronic kidney disease with stage 1 through stage 4 chronic kidney disease, or unspecified chronic kidney disease: Secondary | ICD-10-CM | POA: Diagnosis not present

## 2021-03-16 DIAGNOSIS — N2581 Secondary hyperparathyroidism of renal origin: Secondary | ICD-10-CM | POA: Diagnosis not present

## 2021-03-16 DIAGNOSIS — E118 Type 2 diabetes mellitus with unspecified complications: Secondary | ICD-10-CM | POA: Diagnosis not present

## 2021-03-16 DIAGNOSIS — D539 Nutritional anemia, unspecified: Secondary | ICD-10-CM | POA: Diagnosis not present

## 2021-03-19 DIAGNOSIS — I509 Heart failure, unspecified: Secondary | ICD-10-CM | POA: Diagnosis not present

## 2021-03-19 DIAGNOSIS — N184 Chronic kidney disease, stage 4 (severe): Secondary | ICD-10-CM | POA: Diagnosis not present

## 2021-03-19 DIAGNOSIS — J9610 Chronic respiratory failure, unspecified whether with hypoxia or hypercapnia: Secondary | ICD-10-CM | POA: Diagnosis not present

## 2021-03-19 DIAGNOSIS — L03319 Cellulitis of trunk, unspecified: Secondary | ICD-10-CM | POA: Diagnosis not present

## 2021-03-19 DIAGNOSIS — G4733 Obstructive sleep apnea (adult) (pediatric): Secondary | ICD-10-CM | POA: Diagnosis not present

## 2021-03-29 ENCOUNTER — Telehealth: Payer: Self-pay | Admitting: *Deleted

## 2021-03-29 ENCOUNTER — Inpatient Hospital Stay (HOSPITAL_BASED_OUTPATIENT_CLINIC_OR_DEPARTMENT_OTHER): Payer: Medicare Other | Admitting: Hematology & Oncology

## 2021-03-29 ENCOUNTER — Encounter: Payer: Self-pay | Admitting: Hematology & Oncology

## 2021-03-29 ENCOUNTER — Inpatient Hospital Stay: Payer: Medicare Other | Attending: Hematology & Oncology

## 2021-03-29 ENCOUNTER — Other Ambulatory Visit: Payer: Self-pay

## 2021-03-29 VITALS — BP 139/66 | HR 76 | Temp 98.4°F | Resp 16 | Wt 169.0 lb

## 2021-03-29 DIAGNOSIS — Z8546 Personal history of malignant neoplasm of prostate: Secondary | ICD-10-CM | POA: Insufficient documentation

## 2021-03-29 DIAGNOSIS — Z940 Kidney transplant status: Secondary | ICD-10-CM | POA: Insufficient documentation

## 2021-03-29 DIAGNOSIS — M109 Gout, unspecified: Secondary | ICD-10-CM | POA: Diagnosis not present

## 2021-03-29 DIAGNOSIS — D631 Anemia in chronic kidney disease: Secondary | ICD-10-CM

## 2021-03-29 DIAGNOSIS — D61818 Other pancytopenia: Secondary | ICD-10-CM | POA: Insufficient documentation

## 2021-03-29 DIAGNOSIS — Z79899 Other long term (current) drug therapy: Secondary | ICD-10-CM | POA: Diagnosis not present

## 2021-03-29 DIAGNOSIS — N189 Chronic kidney disease, unspecified: Secondary | ICD-10-CM | POA: Diagnosis present

## 2021-03-29 DIAGNOSIS — N184 Chronic kidney disease, stage 4 (severe): Secondary | ICD-10-CM | POA: Diagnosis not present

## 2021-03-29 DIAGNOSIS — R5383 Other fatigue: Secondary | ICD-10-CM | POA: Diagnosis not present

## 2021-03-29 DIAGNOSIS — N028 Recurrent and persistent hematuria with other morphologic changes: Secondary | ICD-10-CM | POA: Insufficient documentation

## 2021-03-29 DIAGNOSIS — T8611 Kidney transplant rejection: Secondary | ICD-10-CM | POA: Insufficient documentation

## 2021-03-29 LAB — CBC WITH DIFFERENTIAL (CANCER CENTER ONLY)
Abs Immature Granulocytes: 0.09 10*3/uL — ABNORMAL HIGH (ref 0.00–0.07)
Band Neutrophils: 0 %
Basophils Absolute: 0 10*3/uL (ref 0.0–0.1)
Basophils Relative: 0 %
Eosinophils Absolute: 0 10*3/uL (ref 0.0–0.5)
Eosinophils Relative: 0 %
HCT: 32.4 % — ABNORMAL LOW (ref 39.0–52.0)
Hemoglobin: 10.2 g/dL — ABNORMAL LOW (ref 13.0–17.0)
Immature Granulocytes: 1 %
Lymphocytes Relative: 7 %
Lymphs Abs: 0.4 10*3/uL — ABNORMAL LOW (ref 0.7–4.0)
MCH: 33.9 pg (ref 26.0–34.0)
MCHC: 31.5 g/dL (ref 30.0–36.0)
MCV: 107.6 fL — ABNORMAL HIGH (ref 80.0–100.0)
Monocytes Absolute: 0.6 10*3/uL (ref 0.1–1.0)
Monocytes Relative: 9 %
Neutro Abs: 5 10*3/uL (ref 1.7–7.7)
Neutrophils Relative %: 82 %
Platelet Count: 168 10*3/uL (ref 150–400)
RBC: 3.01 MIL/uL — ABNORMAL LOW (ref 4.22–5.81)
RDW: 18.2 % — ABNORMAL HIGH (ref 11.5–15.5)
WBC Count: 6.1 10*3/uL (ref 4.0–10.5)
nRBC: 0 % (ref 0.0–0.2)
nRBC: 0 /100 WBC

## 2021-03-29 LAB — CMP (CANCER CENTER ONLY)
ALT: 13 U/L (ref 0–44)
AST: 13 U/L — ABNORMAL LOW (ref 15–41)
Albumin: 3.9 g/dL (ref 3.5–5.0)
Alkaline Phosphatase: 90 U/L (ref 38–126)
Anion gap: 10 (ref 5–15)
BUN: 79 mg/dL — ABNORMAL HIGH (ref 8–23)
CO2: 26 mmol/L (ref 22–32)
Calcium: 10.3 mg/dL (ref 8.9–10.3)
Chloride: 105 mmol/L (ref 98–111)
Creatinine: 3.47 mg/dL (ref 0.61–1.24)
GFR, Estimated: 18 mL/min — ABNORMAL LOW (ref >60)
Glucose, Bld: 245 mg/dL — ABNORMAL HIGH (ref 70–99)
Potassium: 4.4 mmol/L (ref 3.5–5.1)
Sodium: 141 mmol/L (ref 135–145)
Total Bilirubin: 0.4 mg/dL (ref 0.3–1.2)
Total Protein: 7.3 g/dL (ref 6.5–8.1)

## 2021-03-29 LAB — RETICULOCYTES
Immature Retic Fract: 17.8 % — ABNORMAL HIGH (ref 2.3–15.9)
RBC.: 3 MIL/uL — ABNORMAL LOW (ref 4.22–5.81)
Retic Count, Absolute: 75 10*3/uL (ref 19.0–186.0)
Retic Ct Pct: 2.5 % (ref 0.4–3.1)

## 2021-03-29 LAB — LACTATE DEHYDROGENASE: LDH: 110 U/L (ref 98–192)

## 2021-03-29 LAB — VITAMIN B12: Vitamin B-12: 1828 pg/mL — ABNORMAL HIGH (ref 180–914)

## 2021-03-29 NOTE — Progress Notes (Signed)
Hematology and Oncology Follow Up Visit  AYYAN SITES [id number] 25-Apr-1951 70 y.o. 03/29/2021   Principle Diagnosis:   Pancytopenia likely secondary to Imuran for renal transplant rejection  Current Therapy:    Observation     Interim History:  Mr. Ruppe is in the office for his follow-up.  His blood counts continue to improve.  I am very impressed with this.  He has come a long, long way since we first saw him in the hospital.  His renal function is not all that great.  He recently had some gout.  He is on a Medrol Dosepak for this.  He has had no fever.  His appetite has been good.  There is no problems with diarrhea.  He has had no nausea or vomiting.  We saw him back in March, his iron studies showed a ferritin of 2194 with iron saturation of 18%.  What was very impressive was that his erythropoietin level went down from 1262 down to 135.  This clearly is a good marker for [place].  Overall, I would say his performance status is by ECOG 1.    Medications:  Current Outpatient Medications:  .  acetaminophen (TYLENOL) 500 MG tablet, Take 1,000 mg by mouth every 6 (six) hours as needed for moderate pain., Disp: , Rfl:  .  calcitRIOL (ROCALTROL) 0.25 MCG capsule, Take 0.25 mcg by mouth daily., Disp: , Rfl:  .  cinacalcet (SENSIPAR) 30 MG tablet, Take 30 mg by mouth daily., Disp: , Rfl:  .  famotidine (PEPCID) 20 MG tablet, Take 20 mg by mouth daily., Disp: , Rfl:  .  folic acid (FOLVITE) 1 MG tablet, Take 2 tablets (2 mg total) by mouth daily., Disp: 60 tablet, Rfl: 12 .  furosemide (LASIX) 40 MG tablet, Take 1 tablet (40 mg total) by mouth daily., Disp: 30 tablet, Rfl:  .  glipiZIDE (GLUCOTROL XL) 5 MG 24 hr tablet, Take 1 tablet (5 mg total) by mouth daily with breakfast., Disp: , Rfl:  .  insulin glargine (LANTUS) 100 UNIT/ML injection, Inject 0.1 mLs (10 Units total) into the skin at bedtime., Disp: 10 mL, Rfl: 11 .  loperamide (IMODIUM) 2 MG capsule, Take 2 mg by mouth  as needed for diarrhea or loose stools., Disp: , Rfl:  .  loratadine (CLARITIN) 10 MG tablet, Take 10 mg by mouth daily., Disp: , Rfl:  .  Magnesium Cl-Calcium Carbonate (SLOW-MAG PO), Take 1 tablet by mouth daily. , Disp: , Rfl:  .  magnesium oxide (MAG-OX) 400 MG tablet, Take 1 tablet by mouth daily., Disp: , Rfl:  .  metoprolol succinate (TOPROL-XL) 50 MG 24 hr tablet, Take 0.5 tablets by mouth daily. , Disp: , Rfl:  .  pravastatin (PRAVACHOL) 10 MG tablet, Take 10 mg by mouth at bedtime. , Disp: , Rfl:  .  tacrolimus (PROGRAF) 0.5 MG capsule, Take 1 mg by mouth 2 (two) times daily. Reviewed bottle, Disp: , Rfl:  .  TRADJENTA 5 MG TABS tablet, Take 5 mg by mouth daily., Disp: , Rfl:  .  vitamin B-12 (CYANOCOBALAMIN) 500 MCG tablet, Take 500 mcg by mouth at bedtime., Disp: , Rfl:   Allergies:  Allergies  Allergen Reactions  . Azathioprine Other (See Comments)  . Pollen Extract Other (See Comments)    stuffy nose    Past Medical History, Surgical history, Social history, and Family History were reviewed and updated.  Review of Systems: Review of Systems  Constitutional: Positive for fatigue.  HENT:  Negative.   Eyes: Negative.   Respiratory: Negative.   Cardiovascular: Negative.   Gastrointestinal: Negative.   Endocrine: Negative.   Genitourinary: Negative.    Musculoskeletal: Negative.   Skin: Negative.   Neurological: Negative.   Hematological: Negative.   Psychiatric/Behavioral: Negative.     Physical Exam:  weight is 169 lb (76.7 kg). His oral temperature is 98.4 F (36.9 C). His blood pressure is 139/66 and his pulse is 76. His respiration is 16 and oxygen saturation is 100%.   Wt Readings from Last 3 Encounters:  03/29/21 169 lb (76.7 kg)  03/02/21 170 lb (77.1 kg)  09/26/20 181 lb 12.8 oz (82.5 kg)    Physical Exam Vitals reviewed.  HENT:     Head: Normocephalic and atraumatic.  Eyes:     Pupils: Pupils are equal, round, and reactive to light.   Cardiovascular:     Rate and Rhythm: Normal rate and regular rhythm.     Heart sounds: Normal heart sounds.  Pulmonary:     Effort: Pulmonary effort is normal.     Breath sounds: Normal breath sounds.  Abdominal:     General: Bowel sounds are normal.     Palpations: Abdomen is soft.  Musculoskeletal:        General: No tenderness or deformity. Normal range of motion.     Cervical back: Normal range of motion.  Lymphadenopathy:     Cervical: No cervical adenopathy.  Skin:    General: Skin is warm and dry.     Findings: No erythema or rash.  Neurological:     Mental Status: He is alert and oriented to person, place, and time.  Psychiatric:        Behavior: Behavior normal.        Thought Content: Thought content normal.        Judgment: Judgment normal.    Lab Results  Component Value Date   WBC 6.1 03/29/2021   HGB 10.2 (L) 03/29/2021   HCT 32.4 (L) 03/29/2021   MCV 107.6 (H) 03/29/2021   PLT 168 03/29/2021     Chemistry      Component Value Date/Time   NA 141 03/29/2021 1127   K 4.4 03/29/2021 1127   CL 105 03/29/2021 1127   CO2 26 03/29/2021 1127   BUN 79 (H) 03/29/2021 1127   CREATININE 3.47 (HH) 03/29/2021 1127      Component Value Date/Time   CALCIUM 10.3 03/29/2021 1127   ALKPHOS 90 03/29/2021 1127   AST 13 (L) 03/29/2021 1127   ALT 13 03/29/2021 1127   BILITOT 0.4 03/29/2021 1127      Impression and Plan: Mr. Hunley is a very nice 70 year old white male.  He has a history of IgA nephropathy.  He underwent a renal transplant in 2012.  I am so happy that his blood counts are improving.  I would have to believe that the Imuran was a factor with this.  I does hope that his renal function does not worsen.  There is no indication as of yet that he needs dialysis.  I think we can now get him back in a couple months.  If things look good in a couple months, and his blood counts are improved, then maybe we can let him go from the clinic.    Volanda Napoleon, MD 4/7/20224:04 PM

## 2021-03-29 NOTE — Telephone Encounter (Signed)
Dr. Marin Olp notified of creatinine of 3.47.  No new orders received at this time.

## 2021-03-30 LAB — IRON AND TIBC
Iron: 68 ug/dL (ref 42–163)
Saturation Ratios: 28 % (ref 20–55)
TIBC: 248 ug/dL (ref 202–409)
UIBC: 179 ug/dL (ref 117–376)

## 2021-03-30 LAB — FERRITIN: Ferritin: 1450 ng/mL — ABNORMAL HIGH (ref 24–336)

## 2021-05-17 ENCOUNTER — Telehealth: Payer: Self-pay

## 2021-05-17 ENCOUNTER — Other Ambulatory Visit: Payer: Self-pay

## 2021-05-17 ENCOUNTER — Inpatient Hospital Stay: Payer: Medicare Other | Attending: Hematology & Oncology

## 2021-05-17 ENCOUNTER — Encounter: Payer: Self-pay | Admitting: Hematology & Oncology

## 2021-05-17 ENCOUNTER — Inpatient Hospital Stay (HOSPITAL_BASED_OUTPATIENT_CLINIC_OR_DEPARTMENT_OTHER): Payer: Medicare Other | Admitting: Hematology & Oncology

## 2021-05-17 VITALS — BP 134/75 | HR 79 | Temp 98.6°F | Resp 17 | Ht 69.0 in | Wt 173.0 lb

## 2021-05-17 DIAGNOSIS — Z8546 Personal history of malignant neoplasm of prostate: Secondary | ICD-10-CM | POA: Insufficient documentation

## 2021-05-17 DIAGNOSIS — D61818 Other pancytopenia: Secondary | ICD-10-CM | POA: Insufficient documentation

## 2021-05-17 DIAGNOSIS — N189 Chronic kidney disease, unspecified: Secondary | ICD-10-CM | POA: Diagnosis present

## 2021-05-17 DIAGNOSIS — Z940 Kidney transplant status: Secondary | ICD-10-CM | POA: Diagnosis not present

## 2021-05-17 DIAGNOSIS — Z79899 Other long term (current) drug therapy: Secondary | ICD-10-CM | POA: Diagnosis not present

## 2021-05-17 DIAGNOSIS — N184 Chronic kidney disease, stage 4 (severe): Secondary | ICD-10-CM

## 2021-05-17 DIAGNOSIS — D631 Anemia in chronic kidney disease: Secondary | ICD-10-CM | POA: Insufficient documentation

## 2021-05-17 LAB — CMP (CANCER CENTER ONLY)
ALT: 13 U/L (ref 0–44)
AST: 10 U/L — ABNORMAL LOW (ref 15–41)
Albumin: 4.1 g/dL (ref 3.5–5.0)
Alkaline Phosphatase: 103 U/L (ref 38–126)
Anion gap: 8 (ref 5–15)
BUN: 74 mg/dL — ABNORMAL HIGH (ref 8–23)
CO2: 28 mmol/L (ref 22–32)
Calcium: 11 mg/dL — ABNORMAL HIGH (ref 8.9–10.3)
Chloride: 105 mmol/L (ref 98–111)
Creatinine: 3.06 mg/dL (ref 0.61–1.24)
GFR, Estimated: 21 mL/min — ABNORMAL LOW (ref >60)
Glucose, Bld: 136 mg/dL — ABNORMAL HIGH (ref 70–99)
Potassium: 5.6 mmol/L — ABNORMAL HIGH (ref 3.5–5.1)
Sodium: 141 mmol/L (ref 135–145)
Total Bilirubin: 0.5 mg/dL (ref 0.3–1.2)
Total Protein: 7.6 g/dL (ref 6.5–8.1)

## 2021-05-17 LAB — CBC WITH DIFFERENTIAL (CANCER CENTER ONLY)
Abs Immature Granulocytes: 0.01 10*3/uL (ref 0.00–0.07)
Basophils Absolute: 0 10*3/uL (ref 0.0–0.1)
Basophils Relative: 0 %
Eosinophils Absolute: 0 10*3/uL (ref 0.0–0.5)
Eosinophils Relative: 1 %
HCT: 35.8 % — ABNORMAL LOW (ref 39.0–52.0)
Hemoglobin: 11.3 g/dL — ABNORMAL LOW (ref 13.0–17.0)
Immature Granulocytes: 0 %
Lymphocytes Relative: 9 %
Lymphs Abs: 0.5 10*3/uL — ABNORMAL LOW (ref 0.7–4.0)
MCH: 34.5 pg — ABNORMAL HIGH (ref 26.0–34.0)
MCHC: 31.6 g/dL (ref 30.0–36.0)
MCV: 109.1 fL — ABNORMAL HIGH (ref 80.0–100.0)
Monocytes Absolute: 0.5 10*3/uL (ref 0.1–1.0)
Monocytes Relative: 10 %
Neutro Abs: 4.6 10*3/uL (ref 1.7–7.7)
Neutrophils Relative %: 80 %
Platelet Count: 131 10*3/uL — ABNORMAL LOW (ref 150–400)
RBC: 3.28 MIL/uL — ABNORMAL LOW (ref 4.22–5.81)
RDW: 14.6 % (ref 11.5–15.5)
WBC Count: 5.7 10*3/uL (ref 4.0–10.5)
nRBC: 0 % (ref 0.0–0.2)

## 2021-05-17 LAB — RETICULOCYTES
Immature Retic Fract: 13.8 % (ref 2.3–15.9)
RBC.: 3.38 MIL/uL — ABNORMAL LOW (ref 4.22–5.81)
Retic Count, Absolute: 52.5 10*3/uL (ref 19.0–186.0)
Retic Ct Pct: 1.6 % (ref 0.4–3.1)

## 2021-05-17 LAB — SAVE SMEAR(SSMR), FOR PROVIDER SLIDE REVIEW

## 2021-05-17 NOTE — Telephone Encounter (Signed)
appts made and printed for pt per 05/17/21 los   Russell Bowers

## 2021-05-17 NOTE — Progress Notes (Signed)
Hematology and Oncology Follow Up Visit  JENS SIEMS [id number] 14-Jul-1951 70 y.o. 05/17/2021   Principle Diagnosis:   Pancytopenia likely secondary to Imuran for renal transplant rejection  Current Therapy:    Observation     Interim History:  Mr. Pope is in the office for his follow-up.  He keeps making good progress.  His hemoglobin keeps coming up.  He feels better.  Has more energy.  He is able to do more.  There is been no bleeding.  He still urinating.  He does see the nephrologist.  He has had no problems with nausea or vomiting.  Is been no cough or shortness of breath.  He has had no leg swelling.  He has had no rashes.  His iron studies that we did back in April showed a ferritin of 1450 with an iron saturation of 28%.  He has had no problems with headache.  Overall, his appetites been quite good.  It sounds like he will have a good time this summer with his family.  They are planning on going to [place] for vacation.  Currently, his performance status is ECOG 1.    Medications:  Current Outpatient Medications:  .  acetaminophen (TYLENOL) 500 MG tablet, Take 1,000 mg by mouth every 6 (six) hours as needed for moderate pain., Disp: , Rfl:  .  B-D ULTRAFINE III SHORT PEN 31G X 8 MM MISC, Inject into the skin daily., Disp: , Rfl:  .  calcitRIOL (ROCALTROL) 0.25 MCG capsule, Take 0.25 mcg by mouth daily., Disp: , Rfl:  .  cinacalcet (SENSIPAR) 30 MG tablet, Take 30 mg by mouth daily., Disp: , Rfl:  .  famotidine (PEPCID) 20 MG tablet, Take 20 mg by mouth daily., Disp: , Rfl:  .  folic acid (FOLVITE) 1 MG tablet, Take 2 tablets (2 mg total) by mouth daily., Disp: 60 tablet, Rfl: 12 .  furosemide (LASIX) 40 MG tablet, Take 1 tablet (40 mg total) by mouth daily., Disp: 30 tablet, Rfl:  .  glipiZIDE (GLUCOTROL XL) 5 MG 24 hr tablet, Take 1 tablet (5 mg total) by mouth daily with breakfast., Disp: , Rfl:  .  insulin glargine (LANTUS) 100 UNIT/ML injection,  Inject 0.1 mLs (10 Units total) into the skin at bedtime., Disp: 10 mL, Rfl: 11 .  loperamide (IMODIUM) 2 MG capsule, Take 2 mg by mouth as needed for diarrhea or loose stools., Disp: , Rfl:  .  loratadine (CLARITIN) 10 MG tablet, Take 10 mg by mouth daily., Disp: , Rfl:  .  Magnesium Cl-Calcium Carbonate (SLOW-MAG PO), Take 1 tablet by mouth daily. , Disp: , Rfl:  .  metoprolol succinate (TOPROL-XL) 50 MG 24 hr tablet, Take 0.5 tablets by mouth daily. , Disp: , Rfl:  .  mupirocin ointment (BACTROBAN) 2 %, Apply 1 application topically 2 (two) times daily., Disp: , Rfl:  .  pravastatin (PRAVACHOL) 10 MG tablet, Take 10 mg by mouth at bedtime. , Disp: , Rfl:  .  tacrolimus (PROGRAF) 0.5 MG capsule, Take 1 mg by mouth 2 (two) times daily. Reviewed bottle, Disp: , Rfl:  .  TRADJENTA 5 MG TABS tablet, Take 5 mg by mouth daily., Disp: , Rfl:  .  vitamin B-12 (CYANOCOBALAMIN) 500 MCG tablet, Take 500 mcg by mouth at bedtime., Disp: , Rfl:  .  magnesium oxide (MAG-OX) 400 MG tablet, Take 1 tablet by mouth daily. (Patient not taking: Reported on 05/17/2021), Disp: , Rfl:   Allergies:  Allergies  Allergen Reactions  . Azathioprine Other (See Comments)  . Pollen Extract Other (See Comments)    stuffy nose    Past Medical History, Surgical history, Social history, and Family History were reviewed and updated.  Review of Systems: Review of Systems  Constitutional: Positive for fatigue.  HENT:  Negative.   Eyes: Negative.   Respiratory: Negative.   Cardiovascular: Negative.   Gastrointestinal: Negative.   Endocrine: Negative.   Genitourinary: Negative.    Musculoskeletal: Negative.   Skin: Negative.   Neurological: Negative.   Hematological: Negative.   Psychiatric/Behavioral: Negative.     Physical Exam:  height is 5\' 9"  (1.753 m) and weight is 173 lb 0.6 oz (78.5 kg). His oral temperature is 98.6 F (37 C). His blood pressure is 134/75 and his pulse is 79. His respiration is 17 and  oxygen saturation is 100%.   Wt Readings from Last 3 Encounters:  05/17/21 173 lb 0.6 oz (78.5 kg)  03/29/21 169 lb (76.7 kg)  03/02/21 170 lb (77.1 kg)    Physical Exam Vitals reviewed.  HENT:     Head: Normocephalic and atraumatic.  Eyes:     Pupils: Pupils are equal, round, and reactive to light.  Cardiovascular:     Rate and Rhythm: Normal rate and regular rhythm.     Heart sounds: Normal heart sounds.  Pulmonary:     Effort: Pulmonary effort is normal.     Breath sounds: Normal breath sounds.  Abdominal:     General: Bowel sounds are normal.     Palpations: Abdomen is soft.  Musculoskeletal:        General: No tenderness or deformity. Normal range of motion.     Cervical back: Normal range of motion.  Lymphadenopathy:     Cervical: No cervical adenopathy.  Skin:    General: Skin is warm and dry.     Findings: No erythema or rash.  Neurological:     Mental Status: He is alert and oriented to person, place, and time.  Psychiatric:        Behavior: Behavior normal.        Thought Content: Thought content normal.        Judgment: Judgment normal.    Lab Results  Component Value Date   WBC 5.7 05/17/2021   HGB 11.3 (L) 05/17/2021   HCT 35.8 (L) 05/17/2021   MCV 109.1 (H) 05/17/2021   PLT 131 (L) 05/17/2021     Chemistry      Component Value Date/Time   NA 141 05/17/2021 1124   K 5.6 (H) 05/17/2021 1124   CL 105 05/17/2021 1124   CO2 28 05/17/2021 1124   BUN 74 (H) 05/17/2021 1124   CREATININE 3.06 (HH) 05/17/2021 1124      Component Value Date/Time   CALCIUM 11.0 (H) 05/17/2021 1124   ALKPHOS 103 05/17/2021 1124   AST 10 (L) 05/17/2021 1124   ALT 13 05/17/2021 1124   BILITOT 0.5 05/17/2021 1124      Impression and Plan: Mr. Ausburn is a very nice 70 year old white male.  He has a history of IgA nephropathy.  He underwent a renal transplant in 2012.  I am so happy that his blood counts are improving.  Alert has blood under the microscope.  I do  not see anything that looked suspicious.  He had good maturation of his red blood cells.  I do not see any nucleated red blood cells.  There is no rouleaux formation.  He had  no schistocytes or teardrops.  White blood cells appear normal in morphology and maturation.  Platelets are adequate number and size.  We will continue to move his appointments out further and further.  I will now have him come back to see [place] in about 2 months or so.  I think this would be reasonable.      Volanda Napoleon, MD 5/26/20221:13 PM

## 2021-07-26 ENCOUNTER — Inpatient Hospital Stay: Payer: Medicare Other | Attending: Hematology & Oncology

## 2021-07-26 ENCOUNTER — Inpatient Hospital Stay: Payer: Medicare Other | Admitting: Hematology & Oncology

## 2021-07-26 ENCOUNTER — Telehealth: Payer: Self-pay | Admitting: *Deleted

## 2021-07-26 ENCOUNTER — Other Ambulatory Visit: Payer: Self-pay

## 2021-07-26 VITALS — BP 152/65 | HR 77 | Temp 98.4°F | Resp 16 | Wt 175.0 lb

## 2021-07-26 DIAGNOSIS — N189 Chronic kidney disease, unspecified: Secondary | ICD-10-CM | POA: Diagnosis present

## 2021-07-26 DIAGNOSIS — D631 Anemia in chronic kidney disease: Secondary | ICD-10-CM | POA: Insufficient documentation

## 2021-07-26 DIAGNOSIS — Z79899 Other long term (current) drug therapy: Secondary | ICD-10-CM | POA: Insufficient documentation

## 2021-07-26 DIAGNOSIS — Z940 Kidney transplant status: Secondary | ICD-10-CM | POA: Insufficient documentation

## 2021-07-26 DIAGNOSIS — Z8546 Personal history of malignant neoplasm of prostate: Secondary | ICD-10-CM | POA: Diagnosis not present

## 2021-07-26 DIAGNOSIS — D61818 Other pancytopenia: Secondary | ICD-10-CM | POA: Insufficient documentation

## 2021-07-26 DIAGNOSIS — N1831 Chronic kidney disease, stage 3a: Secondary | ICD-10-CM

## 2021-07-26 LAB — SAVE SMEAR(SSMR), FOR PROVIDER SLIDE REVIEW

## 2021-07-26 LAB — RETICULOCYTES
Immature Retic Fract: 9.7 % (ref 2.3–15.9)
RBC.: 3.57 MIL/uL — ABNORMAL LOW (ref 4.22–5.81)
Retic Count, Absolute: 56.8 10*3/uL (ref 19.0–186.0)
Retic Ct Pct: 1.6 % (ref 0.4–3.1)

## 2021-07-26 LAB — CMP (CANCER CENTER ONLY)
ALT: 15 U/L (ref 0–44)
AST: 13 U/L — ABNORMAL LOW (ref 15–41)
Albumin: 3.9 g/dL (ref 3.5–5.0)
Alkaline Phosphatase: 112 U/L (ref 38–126)
Anion gap: 10 (ref 5–15)
BUN: 65 mg/dL — ABNORMAL HIGH (ref 8–23)
CO2: 26 mmol/L (ref 22–32)
Calcium: 10.2 mg/dL (ref 8.9–10.3)
Chloride: 104 mmol/L (ref 98–111)
Creatinine: 3.21 mg/dL (ref 0.61–1.24)
GFR, Estimated: 20 mL/min — ABNORMAL LOW (ref >60)
Glucose, Bld: 140 mg/dL — ABNORMAL HIGH (ref 70–99)
Potassium: 4.5 mmol/L (ref 3.5–5.1)
Sodium: 140 mmol/L (ref 135–145)
Total Bilirubin: 0.4 mg/dL (ref 0.3–1.2)
Total Protein: 7.4 g/dL (ref 6.5–8.1)

## 2021-07-26 LAB — CBC WITH DIFFERENTIAL (CANCER CENTER ONLY)
Abs Immature Granulocytes: 0.06 10*3/uL (ref 0.00–0.07)
Basophils Absolute: 0 10*3/uL (ref 0.0–0.1)
Basophils Relative: 0 %
Eosinophils Absolute: 0.1 10*3/uL (ref 0.0–0.5)
Eosinophils Relative: 1 %
HCT: 37.6 % — ABNORMAL LOW (ref 39.0–52.0)
Hemoglobin: 11.8 g/dL — ABNORMAL LOW (ref 13.0–17.0)
Immature Granulocytes: 1 %
Lymphocytes Relative: 12 %
Lymphs Abs: 0.7 10*3/uL (ref 0.7–4.0)
MCH: 33.4 pg (ref 26.0–34.0)
MCHC: 31.4 g/dL (ref 30.0–36.0)
MCV: 106.5 fL — ABNORMAL HIGH (ref 80.0–100.0)
Monocytes Absolute: 0.4 10*3/uL (ref 0.1–1.0)
Monocytes Relative: 8 %
Neutro Abs: 4 10*3/uL (ref 1.7–7.7)
Neutrophils Relative %: 78 %
Platelet Count: 114 10*3/uL — ABNORMAL LOW (ref 150–400)
RBC: 3.53 MIL/uL — ABNORMAL LOW (ref 4.22–5.81)
RDW: 12.7 % (ref 11.5–15.5)
WBC Count: 5.2 10*3/uL (ref 4.0–10.5)
nRBC: 0 % (ref 0.0–0.2)

## 2021-07-26 LAB — LACTATE DEHYDROGENASE: LDH: 106 U/L (ref 98–192)

## 2021-07-26 NOTE — Telephone Encounter (Signed)
Per 07/26/21 los gave upcoming appointments - confirmed

## 2021-07-26 NOTE — Progress Notes (Signed)
Hematology and Oncology Follow Up Visit  TUCK DULWORTH [id number] 1951/03/25 70 y.o. 07/26/2021   Principle Diagnosis:  Pancytopenia likely secondary to Imuran for renal transplant rejection  Current Therapy:   Observation     Interim History:  Mr. Budai is back for follow-up.  We last saw him back in May.  Since then, he has been doing quite well.  He comes in with his wife.  They had a nice beach vacation.  He has been fairly busy.  He has had no problems with COVID.  He saw his kidney doctor a few weeks ago.  Everything seemed to be doing fairly stable.  He has had no issues with respect to infection.  He actually did have a urinary tract infection.  He has recurrent urinary tract infections.  Again this is being treated by nephrology.  His last iron studies back in April showed a ferritin of 1800 with an iron saturation of 28%.  He has had no rashes.  He has had no leg swelling.  He has had no bleeding.  Is no problems with his bowels.  Overall, I would say his performance status is probably ECOG 1.  Medications:  Current Outpatient Medications:    acetaminophen (TYLENOL) 500 MG tablet, Take 1,000 mg by mouth every 6 (six) hours as needed for moderate pain., Disp: , Rfl:    B-D ULTRAFINE III SHORT PEN 31G X 8 MM MISC, Inject into the skin daily., Disp: , Rfl:    calcitRIOL (ROCALTROL) 0.25 MCG capsule, Take 0.25 mcg by mouth daily., Disp: , Rfl:    cinacalcet (SENSIPAR) 30 MG tablet, Take 30 mg by mouth daily., Disp: , Rfl:    famotidine (PEPCID) 20 MG tablet, Take 20 mg by mouth daily., Disp: , Rfl:    folic acid (FOLVITE) 1 MG tablet, Take 2 tablets (2 mg total) by mouth daily., Disp: 60 tablet, Rfl: 12   furosemide (LASIX) 40 MG tablet, Take 1 tablet (40 mg total) by mouth daily., Disp: 30 tablet, Rfl:    glipiZIDE (GLUCOTROL XL) 5 MG 24 hr tablet, Take 1 tablet (5 mg total) by mouth daily with breakfast., Disp: , Rfl:    insulin glargine (LANTUS) 100 UNIT/ML  injection, Inject 0.1 mLs (10 Units total) into the skin at bedtime., Disp: 10 mL, Rfl: 11   loperamide (IMODIUM) 2 MG capsule, Take 2 mg by mouth as needed for diarrhea or loose stools., Disp: , Rfl:    loratadine (CLARITIN) 10 MG tablet, Take 10 mg by mouth daily., Disp: , Rfl:    Magnesium Cl-Calcium Carbonate (SLOW-MAG PO), Take 1 tablet by mouth daily. , Disp: , Rfl:    magnesium oxide (MAG-OX) 400 MG tablet, Take 1 tablet by mouth daily. (Patient not taking: Reported on 05/17/2021), Disp: , Rfl:    metoprolol succinate (TOPROL-XL) 50 MG 24 hr tablet, Take 0.5 tablets by mouth daily. , Disp: , Rfl:    mupirocin ointment (BACTROBAN) 2 %, Apply 1 application topically 2 (two) times daily., Disp: , Rfl:    ONETOUCH ULTRA test strip, 3 (three) times daily., Disp: , Rfl:    pravastatin (PRAVACHOL) 10 MG tablet, Take 10 mg by mouth at bedtime. , Disp: , Rfl:    tacrolimus (PROGRAF) 0.5 MG capsule, Take 1 mg by mouth 2 (two) times daily. Reviewed bottle, Disp: , Rfl:    TRADJENTA 5 MG TABS tablet, Take 5 mg by mouth daily., Disp: , Rfl:    vitamin B-12 (CYANOCOBALAMIN) 500 MCG  tablet, Take 500 mcg by mouth at bedtime., Disp: , Rfl:   Allergies:  Allergies  Allergen Reactions   Azathioprine Other (See Comments)   Pollen Extract Other (See Comments)    stuffy nose    Past Medical History, Surgical history, Social history, and Family History were reviewed and updated.  Review of Systems: Review of Systems  Constitutional:  Positive for fatigue.  HENT:  Negative.    Eyes: Negative.   Respiratory: Negative.    Cardiovascular: Negative.   Gastrointestinal: Negative.   Endocrine: Negative.   Genitourinary: Negative.    Musculoskeletal: Negative.   Skin: Negative.   Neurological: Negative.   Hematological: Negative.   Psychiatric/Behavioral: Negative.     Physical Exam:  weight is 175 lb (79.4 kg). His temperature is 98.4 F (36.9 C). His blood pressure is 152/65 (abnormal) and his  pulse is 77. His respiration is 16 and oxygen saturation is 97%.   Wt Readings from Last 3 Encounters:  07/26/21 175 lb (79.4 kg)  05/17/21 173 lb 0.6 oz (78.5 kg)  03/29/21 169 lb (76.7 kg)    Physical Exam Vitals reviewed.  HENT:     Head: Normocephalic and atraumatic.  Eyes:     Pupils: Pupils are equal, round, and reactive to light.  Cardiovascular:     Rate and Rhythm: Normal rate and regular rhythm.     Heart sounds: Normal heart sounds.  Pulmonary:     Effort: Pulmonary effort is normal.     Breath sounds: Normal breath sounds.  Abdominal:     General: Bowel sounds are normal.     Palpations: Abdomen is soft.  Musculoskeletal:        General: No tenderness or deformity. Normal range of motion.     Cervical back: Normal range of motion.  Lymphadenopathy:     Cervical: No cervical adenopathy.  Skin:    General: Skin is warm and dry.     Findings: No erythema or rash.  Neurological:     Mental Status: He is alert and oriented to person, place, and time.  Psychiatric:        Behavior: Behavior normal.        Thought Content: Thought content normal.        Judgment: Judgment normal.   Lab Results  Component Value Date   WBC 5.2 07/26/2021   HGB 11.8 (L) 07/26/2021   HCT 37.6 (L) 07/26/2021   MCV 106.5 (H) 07/26/2021   PLT 114 (L) 07/26/2021     Chemistry      Component Value Date/Time   NA 140 07/26/2021 1324   K 4.5 07/26/2021 1324   CL 104 07/26/2021 1324   CO2 26 07/26/2021 1324   BUN 65 (H) 07/26/2021 1324   CREATININE 3.21 (HH) 07/26/2021 1324      Component Value Date/Time   CALCIUM 10.2 07/26/2021 1324   ALKPHOS 112 07/26/2021 1324   AST 13 (L) 07/26/2021 1324   ALT 15 07/26/2021 1324   BILITOT 0.4 07/26/2021 1324      Impression and Plan: Mr. Nanney is a very nice 70 year old white male.  He has a history of IgA nephropathy.  He underwent a renal transplant in 2012.  I am pleased that his hemoglobin continues to improve slowly.  He does  not need any type of intervention right now.  Everything really is looking good from my point of view.  I think that at this point, we will probably get him back in about  4 months or so.  I want to see him back around the holidays.   Volanda Napoleon, MD 8/4/20222:35 PM

## 2021-07-27 LAB — IRON AND TIBC
Iron: 60 ug/dL (ref 42–163)
Saturation Ratios: 23 % (ref 20–55)
TIBC: 254 ug/dL (ref 202–409)
UIBC: 194 ug/dL (ref 117–376)

## 2021-07-27 LAB — FERRITIN: Ferritin: 932 ng/mL — ABNORMAL HIGH (ref 24–336)

## 2021-11-12 ENCOUNTER — Other Ambulatory Visit: Payer: Self-pay | Admitting: Nephrology

## 2021-11-12 DIAGNOSIS — Z940 Kidney transplant status: Secondary | ICD-10-CM

## 2021-11-22 ENCOUNTER — Ambulatory Visit: Payer: Medicare Other | Admitting: Hematology & Oncology

## 2021-11-22 ENCOUNTER — Other Ambulatory Visit: Payer: Medicare Other

## 2021-11-23 ENCOUNTER — Other Ambulatory Visit: Payer: Medicare Other

## 2021-11-28 ENCOUNTER — Ambulatory Visit
Admission: RE | Admit: 2021-11-28 | Discharge: 2021-11-28 | Disposition: A | Discharge status: Home / Self Care | Payer: Medicare Other | Source: Ambulatory Visit | Attending: Nephrology | Admitting: Nephrology

## 2021-11-28 DIAGNOSIS — Z940 Kidney transplant status: Secondary | ICD-10-CM

## 2022-02-12 ENCOUNTER — Other Ambulatory Visit: Payer: Self-pay | Admitting: Hematology & Oncology

## 2022-03-12 DIAGNOSIS — E1122 Type 2 diabetes mellitus with diabetic chronic kidney disease: Secondary | ICD-10-CM | POA: Diagnosis not present

## 2022-03-26 DIAGNOSIS — E1122 Type 2 diabetes mellitus with diabetic chronic kidney disease: Secondary | ICD-10-CM | POA: Diagnosis not present

## 2022-03-26 DIAGNOSIS — I499 Cardiac arrhythmia, unspecified: Secondary | ICD-10-CM | POA: Diagnosis not present

## 2022-03-26 DIAGNOSIS — Z794 Long term (current) use of insulin: Secondary | ICD-10-CM | POA: Diagnosis not present

## 2022-03-27 ENCOUNTER — Telehealth: Payer: Self-pay

## 2022-03-27 NOTE — Telephone Encounter (Signed)
NOTES SCANNED TO EPIC ?

## 2022-04-23 DIAGNOSIS — C44629 Squamous cell carcinoma of skin of left upper limb, including shoulder: Secondary | ICD-10-CM | POA: Diagnosis not present

## 2022-04-23 DIAGNOSIS — Z85828 Personal history of other malignant neoplasm of skin: Secondary | ICD-10-CM | POA: Diagnosis not present

## 2022-04-23 DIAGNOSIS — L570 Actinic keratosis: Secondary | ICD-10-CM | POA: Diagnosis not present

## 2022-04-23 DIAGNOSIS — L821 Other seborrheic keratosis: Secondary | ICD-10-CM | POA: Diagnosis not present

## 2022-04-23 DIAGNOSIS — L814 Other melanin hyperpigmentation: Secondary | ICD-10-CM | POA: Diagnosis not present

## 2022-04-23 DIAGNOSIS — E118 Type 2 diabetes mellitus with unspecified complications: Secondary | ICD-10-CM | POA: Diagnosis not present

## 2022-04-23 DIAGNOSIS — D1801 Hemangioma of skin and subcutaneous tissue: Secondary | ICD-10-CM | POA: Diagnosis not present

## 2022-04-23 DIAGNOSIS — N184 Chronic kidney disease, stage 4 (severe): Secondary | ICD-10-CM | POA: Diagnosis not present

## 2022-04-23 DIAGNOSIS — D2372 Other benign neoplasm of skin of left lower limb, including hip: Secondary | ICD-10-CM | POA: Diagnosis not present

## 2022-04-23 DIAGNOSIS — C44622 Squamous cell carcinoma of skin of right upper limb, including shoulder: Secondary | ICD-10-CM | POA: Diagnosis not present

## 2022-04-25 ENCOUNTER — Ambulatory Visit: Payer: Medicare Other | Admitting: Cardiology

## 2022-04-25 ENCOUNTER — Encounter: Payer: Self-pay | Admitting: Cardiology

## 2022-04-25 VITALS — BP 132/66 | HR 75 | Ht 69.0 in | Wt 180.2 lb

## 2022-04-25 DIAGNOSIS — Z940 Kidney transplant status: Secondary | ICD-10-CM | POA: Diagnosis not present

## 2022-04-25 DIAGNOSIS — I5032 Chronic diastolic (congestive) heart failure: Secondary | ICD-10-CM | POA: Diagnosis not present

## 2022-04-25 DIAGNOSIS — G4733 Obstructive sleep apnea (adult) (pediatric): Secondary | ICD-10-CM | POA: Diagnosis not present

## 2022-04-25 DIAGNOSIS — I4510 Unspecified right bundle-branch block: Secondary | ICD-10-CM

## 2022-04-25 DIAGNOSIS — I350 Nonrheumatic aortic (valve) stenosis: Secondary | ICD-10-CM | POA: Diagnosis not present

## 2022-04-25 NOTE — Assessment & Plan Note (Signed)
Mild aortic stenosis has been stable from 2014.  Most recent echocardiogram 2022 personally reviewed.  No need for repeat next year. ?

## 2022-04-25 NOTE — Assessment & Plan Note (Signed)
Continue with treatment strategy. ?

## 2022-04-25 NOTE — Assessment & Plan Note (Signed)
Previously noted on EKG.  Today's EKG does not show right bundle branch block.  This may be intermittent.  Benign.  No symptoms such as syncope. ?

## 2022-04-25 NOTE — Assessment & Plan Note (Signed)
Currently chronic kidney disease stage IV.  Nephrology is closely monitoring. ?

## 2022-04-25 NOTE — Patient Instructions (Signed)
Medication Instructions:  The current medical regimen is effective;  continue present plan and medications.  *If you need a refill on your cardiac medications before your next appointment, please call your pharmacy*  Follow-Up: At [facility], you and your health needs are our priority.  As part of our continuing mission to provide you with exceptional heart care, we have created designated Provider Care Teams.  These Care Teams include your primary Cardiologist (physician) and Advanced Practice Providers (APPs -  Physician Assistants and Nurse Practitioners) who all work together to provide you with the care you need, when you need it.  We recommend signing up for the patient portal called "MyChart".  Sign up information is provided on this After Visit Summary.  MyChart is used to connect with patients for Virtual Visits (Telemedicine).  Patients are able to view lab/test results, encounter notes, upcoming appointments, etc.  Non-urgent messages can be sent to your provider as well.   To learn more about what you can do with MyChart, go to [email or web address].    Your next appointment:   1 year(s)  The format for your next appointment:   In Person  Provider:   Dr Mark Skains   Important Information About Sugar       

## 2022-04-25 NOTE — Progress Notes (Signed)
?Cardiology Office Note:   ? ?Date:  04/25/2022  ? ?ID:  Russell Bowers, DOB 1951-04-06, MRN [id number] ? ?PCP:  Russell Cruel, MD ?  ?[facility] Providers ?Cardiologist:  Russell Furbish, MD    ? ?Referring MD: Russell Cruel, MD  ? ? ?History of Present Illness:   ? ?Russell Bowers is a 71 y.o. male here for the evaluation of abnormal EKG at the request of Dr. Melinda Bowers.  Has mild aortic stenosis.  I had seen him last in 2018.  Had mild aortic stenosis with chronic right bundle branch block.  Prior stress test September 2012 was low risk with no evidence of ischemia with normal EF. ? ?Has been followed closely by nephrology secondary to his kidney transplant from 2012.  Had some issues with gastric motility in the past.  Currently chronic kidney disease stage IV.  He is worried about this.  He is worried about going back on dialysis. ? ?Obstructive sleep apnea with weight loss assisting. ? ?Hgb was low from Imuran. 4 PRBC. Back to normal.  ? ?Denies any chest pain shortness of breath. ? ?Past Medical History:  ?Diagnosis Date  ? ADHD (attention deficit hyperactivity disorder)   ? Allergic rhinitis   ? Cancer [facility])   ? Prostate cancer  ? Cellulitis and abscess of trunk 2014  ? history of back abscess  ? Cellulitis and abscess of unspecified site 2014  ? lower back  ? Detached retina   ? Diabetes mellitus without complication ([place])   ? Elevated troponin   ? Fistula   ? OF LEFT ARM  ? Gout   ? H/O kidney transplant   ? History of congestive heart failure   ? FOLLOWED BY DR. Marlou Bowers   ? History of recurrent pneumonia   ? Hypercarbia   ? CHRONIC HYPERCARBIC RESPIRATORY FAILURE/CHRONIC PULMONARY INTERSTITIAL DISEASE OF UNKNOWN ETIOLOGY, PULMONOLOGIST DR. Melvyn Bowers  ? Hypertension   ? Mild concentric left ventricular hypertrophy (LVH)   ? AND AORTIC STENOSIS FOLLOWED IN THE PAST BY CARDIOLOGIST DR. Marlou Bowers.  ? OSA treated with BiPAP   ? Personal history of colonic polyps   ? Prostate cancer (Russell Bowers) 11/2010  ?  PROSTATECTOMY WITH UROLOGIST DR. DAVIS  ? Renal failure   ? Shortness of breath   ? Sleep disorder breathing   ? FOLLOWED BY PULMONOLOGIST DR. Elsworth Soho  ? ? ?Past Surgical History:  ?Procedure Laterality Date  ? CATARACT EXTRACTION, BILATERAL  11/2014  ? COLONOSCOPY  09/2014  ? KIDNEY TRANSPLANT  12/01/2011  ? PROSTATECTOMY    ? FOR PROSTATE CANCER  ? prostectomy  11/2010  ? RETINAL DETACHMENT SURGERY  06/2002  ? ? ?Current Medications: ?Current Meds  ?Medication Sig  ? acetaminophen (TYLENOL) 500 MG tablet Take 1,000 mg by mouth every 6 (six) hours as needed for moderate pain.  ? B-D ULTRAFINE III SHORT PEN 31G X 8 MM MISC Inject into the skin daily.  ? calcitRIOL (ROCALTROL) 0.25 MCG capsule Take 0.25 mcg by mouth daily.  ? cinacalcet (SENSIPAR) 30 MG tablet Take 30 mg by mouth daily.  ? famotidine (PEPCID) 20 MG tablet Take 20 mg by mouth daily.  ? folic acid (FOLVITE) 1 MG tablet TAKE 2 TABLETS BY MOUTH EVERY DAY  ? furosemide (LASIX) 40 MG tablet Take 1 tablet (40 mg total) by mouth daily.  ? glipiZIDE (GLUCOTROL) 5 MG tablet Take 5 mg by mouth daily before breakfast. 3 per day  ? insulin glargine (LANTUS) 100 UNIT/ML  injection Inject 0.1 mLs (10 Units total) into the skin at bedtime.  ? loperamide (IMODIUM) 2 MG capsule Take 2 mg by mouth as needed for diarrhea or loose stools.  ? loratadine (CLARITIN) 10 MG tablet Take 10 mg by mouth daily.  ? Magnesium Cl-Calcium Carbonate (SLOW-MAG PO) Take 1 tablet by mouth daily.   ? metoprolol succinate (TOPROL-XL) 50 MG 24 hr tablet Take 0.5 tablets by mouth daily.   ? mupirocin ointment (BACTROBAN) 2 % Apply 1 application topically 2 (two) times daily.  ? ONETOUCH ULTRA test strip 3 (three) times daily.  ? pravastatin (PRAVACHOL) 10 MG tablet Take 10 mg by mouth at bedtime.   ? tacrolimus (PROGRAF) 0.5 MG capsule Take 1 mg by mouth 2 (two) times daily. Reviewed bottle  ? tacrolimus (PROGRAF) 1 MG capsule SMARTSIG:1 By Mouth  ? TRADJENTA 5 MG TABS tablet Take 5 mg by mouth  daily.  ? vitamin B-12 (CYANOCOBALAMIN) 500 MCG tablet Take 500 mcg by mouth at bedtime.  ?  ? ?Allergies:   Azathioprine and Pollen extract  ? ?Social History  ? ?Socioeconomic History  ? Marital status: Married  ?  Spouse name: Not on file  ? Number of children: Not on file  ? Years of education: Not on file  ? Highest education level: Not on file  ?Occupational History  ? Occupation: Retired  ?Tobacco Use  ? Smoking status: Former  ?  Packs/day: 1.00  ?  Years: 21.00  ?  Pack years: 21.00  ?  Types: Cigarettes  ?  Quit date: 03/16/1993  ?  Years since quitting: 29.1  ? Smokeless tobacco: Never  ?Vaping Use  ? Vaping Use: Never used  ?Substance and Sexual Activity  ? Alcohol use: Yes  ?  Alcohol/week: 1.0 - 2.0 standard drink  ?  Types: 1 - 2 Glasses of wine per week  ? Drug use: No  ? Sexual activity: Not on file  ?Other Topics Concern  ? Not on file  ?Social History Narrative  ? Not on file  ? ?Social Determinants of Health  ? ?Financial Resource Strain: Not on file  ?Food Insecurity: Not on file  ?Transportation Needs: Not on file  ?Physical Activity: Not on file  ?Stress: Not on file  ?Social Connections: Not on file  ?  ? ?Family History: ?The patient's family history includes Alzheimer's disease in his father; Atrial fibrillation in his mother; Diabetes in his mother; Heart Problems in his sister; Heart disease in his maternal grandfather and maternal grandmother; Hypertension in his mother. ? ?ROS:   ?Please see the history of present illness.    ? All other systems reviewed and are negative. ? ?EKGs/Labs/Other Studies Reviewed:   ? ?The following studies were reviewed today: ?ECHO: 04/12/13: ?- Left ventricle: The cavity size was normal. There was mild ?  focal basal hypertrophy of the septum. Systolic function ?  was normal. The estimated ejection fraction was in the ?  range of 60% to 65%. Although no diagnostic regional wall ?  motion abnormality was identified, this possibility cannot ?  be completely  excluded on the basis of this study. ?- Aortic valve: There was mild stenosis. Trivial ?  regurgitation. Valve area: 2.32cm^2(VTI). Valve area: ?  1.54cm^2 (Vmax). ?- Mitral valve: Calcified annulus. Mildly calcified leaflets ?  . Mild regurgitation. ?- Left atrium: The atrium was mildly dilated. ?  ?ECHO 2022: ? ? 1. Left ventricular ejection fraction, by estimation, is 60 to 65%. The  ?  left ventricle has normal function. The left ventricle has no regional  ?wall motion abnormalities. There is mild left ventricular hypertrophy.  ?Left ventricular diastolic parameters  ?were normal.  ? 2. There is flattening of the ventricular septum in systole consistent  ?with RV pressure overload. . Right ventricular systolic function is  ?normal. The right ventricular size is normal.  ? 3. Left atrial size was moderately dilated.  ? 4. The mitral valve is normal in structure. Trivial mitral valve  ?regurgitation. No evidence of mitral stenosis.  ? 5. The aortic valve has an indeterminant number of cusps. There is  ?moderate calcification of the aortic valve. There is moderate thickening  ?of the aortic valve. Aortic valve regurgitation is mild. Mild aortic valve  ?stenosis.  ? ?EKG:  EKG is  ordered today.  The ekg ordered today demonstrates sinus rhythm 75 early R wave transition in V2.  Borderline first-degree AV block with 202 ms.  Previously had right bundle branch block on ECG. ? ?Recent Labs: ?07/26/2021: ALT 15; BUN 65; Creatinine 3.21; Hemoglobin 11.8; Platelet Count 114; Potassium 4.5; Sodium 140  ?Recent Lipid Panel ?No results found for: CHOL, TRIG, HDL, CHOLHDL, VLDL, LDLCALC, LDLDIRECT ? ? ?Risk Assessment/Calculations:   ? ? ?    ? ?   ? ?Physical Exam:   ? ?VS:  BP 132/66   Pulse 75   Ht '5\' 9"'$  (1.753 m)   Wt 180 lb 3.2 oz (81.7 kg)   SpO2 94%   BMI 26.61 kg/m?    ? ?Wt Readings from Last 3 Encounters:  ?04/25/22 180 lb 3.2 oz (81.7 kg)  ?07/26/21 175 lb (79.4 kg)  ?05/17/21 173 lb 0.6 oz (78.5 kg)  ?  ? ?GEN:   Well nourished, well developed in no acute distress ?HEENT: Normal ?NECK: No JVD; No carotid bruits ?LYMPHATICS: No lymphadenopathy ?CARDIAC: RRR, 2/6 murmurs, no rubs, gallops ?RESPIRATORY:  Clear to auscultation wi

## 2022-04-30 DIAGNOSIS — N184 Chronic kidney disease, stage 4 (severe): Secondary | ICD-10-CM | POA: Diagnosis not present

## 2022-04-30 DIAGNOSIS — D631 Anemia in chronic kidney disease: Secondary | ICD-10-CM | POA: Diagnosis not present

## 2022-04-30 DIAGNOSIS — N2581 Secondary hyperparathyroidism of renal origin: Secondary | ICD-10-CM | POA: Diagnosis not present

## 2022-04-30 DIAGNOSIS — E118 Type 2 diabetes mellitus with unspecified complications: Secondary | ICD-10-CM | POA: Diagnosis not present

## 2022-04-30 DIAGNOSIS — Z940 Kidney transplant status: Secondary | ICD-10-CM | POA: Diagnosis not present

## 2022-04-30 DIAGNOSIS — I129 Hypertensive chronic kidney disease with stage 1 through stage 4 chronic kidney disease, or unspecified chronic kidney disease: Secondary | ICD-10-CM | POA: Diagnosis not present

## 2022-05-16 DIAGNOSIS — Z940 Kidney transplant status: Secondary | ICD-10-CM | POA: Diagnosis not present

## 2022-06-05 DIAGNOSIS — J9610 Chronic respiratory failure, unspecified whether with hypoxia or hypercapnia: Secondary | ICD-10-CM | POA: Diagnosis not present

## 2022-08-02 DIAGNOSIS — Z940 Kidney transplant status: Secondary | ICD-10-CM | POA: Diagnosis not present

## 2022-08-02 DIAGNOSIS — R799 Abnormal finding of blood chemistry, unspecified: Secondary | ICD-10-CM | POA: Diagnosis not present

## 2022-08-02 DIAGNOSIS — N390 Urinary tract infection, site not specified: Secondary | ICD-10-CM | POA: Diagnosis not present

## 2022-08-05 DIAGNOSIS — Z940 Kidney transplant status: Secondary | ICD-10-CM | POA: Diagnosis not present

## 2022-08-08 DIAGNOSIS — Z940 Kidney transplant status: Secondary | ICD-10-CM | POA: Diagnosis not present

## 2022-08-08 DIAGNOSIS — D631 Anemia in chronic kidney disease: Secondary | ICD-10-CM | POA: Diagnosis not present

## 2022-08-08 DIAGNOSIS — N390 Urinary tract infection, site not specified: Secondary | ICD-10-CM | POA: Diagnosis not present

## 2022-08-08 DIAGNOSIS — I129 Hypertensive chronic kidney disease with stage 1 through stage 4 chronic kidney disease, or unspecified chronic kidney disease: Secondary | ICD-10-CM | POA: Diagnosis not present

## 2022-08-08 DIAGNOSIS — N184 Chronic kidney disease, stage 4 (severe): Secondary | ICD-10-CM | POA: Diagnosis not present

## 2022-08-08 DIAGNOSIS — E875 Hyperkalemia: Secondary | ICD-10-CM | POA: Diagnosis not present

## 2022-08-08 DIAGNOSIS — N2581 Secondary hyperparathyroidism of renal origin: Secondary | ICD-10-CM | POA: Diagnosis not present

## 2022-08-08 DIAGNOSIS — E118 Type 2 diabetes mellitus with unspecified complications: Secondary | ICD-10-CM | POA: Diagnosis not present

## 2022-08-25 ENCOUNTER — Other Ambulatory Visit: Payer: Self-pay

## 2022-08-25 ENCOUNTER — Encounter (HOSPITAL_COMMUNITY): Payer: Self-pay | Admitting: Emergency Medicine

## 2022-08-25 ENCOUNTER — Emergency Department (HOSPITAL_COMMUNITY): Payer: Medicare Other

## 2022-08-25 ENCOUNTER — Emergency Department (HOSPITAL_COMMUNITY)
Admission: EM | Admit: 2022-08-25 | Discharge: 2022-08-25 | Disposition: A | Discharge status: Home / Self Care | Payer: Medicare Other | Attending: Emergency Medicine | Admitting: Emergency Medicine

## 2022-08-25 DIAGNOSIS — Z794 Long term (current) use of insulin: Secondary | ICD-10-CM | POA: Insufficient documentation

## 2022-08-25 DIAGNOSIS — R000 Tachycardia, unspecified: Secondary | ICD-10-CM | POA: Diagnosis not present

## 2022-08-25 DIAGNOSIS — R109 Unspecified abdominal pain: Secondary | ICD-10-CM

## 2022-08-25 DIAGNOSIS — K8020 Calculus of gallbladder without cholecystitis without obstruction: Secondary | ICD-10-CM | POA: Diagnosis not present

## 2022-08-25 DIAGNOSIS — N390 Urinary tract infection, site not specified: Secondary | ICD-10-CM | POA: Diagnosis not present

## 2022-08-25 DIAGNOSIS — I700 Atherosclerosis of aorta: Secondary | ICD-10-CM | POA: Diagnosis not present

## 2022-08-25 DIAGNOSIS — I509 Heart failure, unspecified: Secondary | ICD-10-CM | POA: Insufficient documentation

## 2022-08-25 DIAGNOSIS — Z940 Kidney transplant status: Secondary | ICD-10-CM | POA: Diagnosis not present

## 2022-08-25 DIAGNOSIS — R1031 Right lower quadrant pain: Secondary | ICD-10-CM | POA: Diagnosis present

## 2022-08-25 DIAGNOSIS — Z0389 Encounter for observation for other suspected diseases and conditions ruled out: Secondary | ICD-10-CM | POA: Diagnosis not present

## 2022-08-25 LAB — COMPREHENSIVE METABOLIC PANEL
ALT: 14 U/L (ref 0–44)
AST: 15 U/L (ref 15–41)
Albumin: 3.7 g/dL (ref 3.5–5.0)
Alkaline Phosphatase: 97 U/L (ref 38–126)
Anion gap: 9 (ref 5–15)
BUN: 65 mg/dL — ABNORMAL HIGH (ref 8–23)
CO2: 23 mmol/L (ref 22–32)
Calcium: 10 mg/dL (ref 8.9–10.3)
Chloride: 107 mmol/L (ref 98–111)
Creatinine, Ser: 4.09 mg/dL — ABNORMAL HIGH (ref 0.61–1.24)
GFR, Estimated: 15 mL/min — ABNORMAL LOW (ref >60)
Glucose, Bld: 177 mg/dL — ABNORMAL HIGH (ref 70–99)
Potassium: 5.1 mmol/L (ref 3.5–5.1)
Sodium: 139 mmol/L (ref 135–145)
Total Bilirubin: 0.6 mg/dL (ref 0.3–1.2)
Total Protein: 7.7 g/dL (ref 6.5–8.1)

## 2022-08-25 LAB — URINALYSIS, ROUTINE W REFLEX MICROSCOPIC
Bilirubin Urine: NEGATIVE
Glucose, UA: 50 mg/dL — AB
Ketones, ur: NEGATIVE mg/dL
Nitrite: NEGATIVE
Protein, ur: >=300 mg/dL — AB
RBC / HPF: >50 RBC/hpf — ABNORMAL HIGH (ref 0–5)
Specific Gravity, Urine: 1.013 (ref 1.005–1.030)
WBC, UA: >50 WBC/hpf — ABNORMAL HIGH (ref 0–5)
pH: 6 (ref 5.0–8.0)

## 2022-08-25 LAB — CBC
HCT: 44.9 % (ref 39.0–52.0)
Hemoglobin: 13.8 g/dL (ref 13.0–17.0)
MCH: 31.4 pg (ref 26.0–34.0)
MCHC: 30.7 g/dL (ref 30.0–36.0)
MCV: 102.3 fL — ABNORMAL HIGH (ref 80.0–100.0)
Platelets: 148 10*3/uL — ABNORMAL LOW (ref 150–400)
RBC: 4.39 MIL/uL (ref 4.22–5.81)
RDW: 13.2 % (ref 11.5–15.5)
WBC: 7.9 10*3/uL (ref 4.0–10.5)
nRBC: 0 % (ref 0.0–0.2)

## 2022-08-25 LAB — LIPASE, BLOOD: Lipase: 43 U/L (ref 11–51)

## 2022-08-25 MED ORDER — SODIUM CHLORIDE 0.9 % IV SOLN
1.0000 g | Freq: Once | INTRAVENOUS | Status: AC
  Administered 2022-08-25: 1 g via INTRAVENOUS
  Filled 2022-08-25: qty 10

## 2022-08-25 MED ORDER — FENTANYL CITRATE PF 50 MCG/ML IJ SOSY
50.0000 ug | PREFILLED_SYRINGE | Freq: Once | INTRAMUSCULAR | Status: AC
  Administered 2022-08-25: 50 ug via INTRAVENOUS
  Filled 2022-08-25: qty 1

## 2022-08-25 MED ORDER — CEFDINIR 300 MG PO CAPS: 300.0000 mg | ORAL_CAPSULE | Freq: Every day | ORAL | 0 refills | Status: AC

## 2022-08-25 MED ORDER — SODIUM CHLORIDE 0.9 % IV BOLUS
500.0000 mL | Freq: Once | INTRAVENOUS | Status: AC
  Administered 2022-08-25: 500 mL via INTRAVENOUS

## 2022-08-25 MED ORDER — SODIUM CHLORIDE 0.9 % IV BOLUS: 1000.0000 mL | Freq: Once | INTRAVENOUS | Status: DC

## 2022-08-25 MED ORDER — ONDANSETRON HCL 4 MG/2ML IJ SOLN
4.0000 mg | Freq: Once | INTRAMUSCULAR | Status: AC
  Administered 2022-08-25: 4 mg via INTRAVENOUS
  Filled 2022-08-25: qty 2

## 2022-08-25 NOTE — ED Notes (Signed)
Discharge paperwork given and education provided. Importance of finishing ABT and making f/u appts with urology and surgery discussed. Pt and wife voices understanding. All questions answered. Pt offered wheelchair and declined, wants to walk. Pt ambulated to exit in stable condition with wife and all belongings

## 2022-08-25 NOTE — ED Notes (Signed)
Patient transported to CT 

## 2022-08-25 NOTE — Discharge Instructions (Addendum)
Please call your urologist office today or tomorrow to establish close follow-up visit.  Your urologist or nephrologist will need to follow-up on your urine culture results.  In the meantime we have started you on a long course of antibiotics for the next 13 days for possible kidney infection, which is called pyelonephritis.  Beginning 08/26/22 in the morning you will take the antibiotic as prescribed.    If you have worsening pain in your belly, difficulty keeping down fluids, stop producing urine, or have fevers or shaking chills, please come back to the ER immediately.  *  You should also call to schedule follow-up appointment with a general surgeon for the gallstones that we discussed.  Please take the time to read through the information that was included with your discharge papers about gallstones and what type of foods to eat and avoid.

## 2022-08-25 NOTE — ED Triage Notes (Signed)
Pt reported to ED with c/o "gallbladder pain" that has been ongoing throughout the night. Pt points to lower right abdomen and states pain radiates into flank and is accompanied by nausea. Denies any urinary frequency or urgency.

## 2022-08-25 NOTE — ED Provider Notes (Signed)
71 yo male w/ hx of renal transplant, here with right lower abdominal pain Pt reports nausea, vomiting x 1, no diarrhea, no fevers/chills, no dysuria. He reports a  hx of frequent UTI's, states he completed antibiotics 1 month ago but cannot recall which ones Renal transplant at [facility] approx 2012 Follows with Dr Carolin Sicks for nephrology locally  Physical Exam  BP (!) 140/95   Pulse (!) 106   Temp 97.8 F (36.6 C) (Oral)   Resp 18   SpO2 93%   Physical Exam  Procedures  Procedures  ED Course / MDM   Clinical Course as of 08/25/22 1005  Sun Aug 25, 2022  0644 Creatinine(!): 4.09 Chronic renal failure [DW]  0644 Leukocytes,Ua(!): LARGE Hematuria and UTI [DW]  0263 Patient reports he does get frequent UTIs, but no recent treatment.  Also has previous history of prostate cancer [DW]  [phone] Signed out to DR Ellamae Lybeck with CT imaging pending.  He may also need tx for UTI/Pyelo but will await CT results [DW]    Clinical Course User Index [DW] Ripley Fraise, MD   Medical Decision Making Amount and/or Complexity of Data Reviewed Labs: ordered. Decision-making details documented in ED Course. Radiology: ordered. ECG/medicine tests: ordered.  Risk Prescription drug management.   Pending CT abdomen pelvis UA potential UTI  *  CT abdomen pelvis personally reviewed and interpreted, perinpehric stranding right kidney, no ureteral stones actively passing; gallstone in GB.  Plan for RUQ ultrasound and transplant ultrasound  I spoke to Dr Carolin Sicks from nephrology who does not have access to patient's urine culture's or positive results, but does think it is reasonable to treat with rocephin and 10-14 days cephalosporin for UTI/pyelonephritis.  If patient is clinically well appearing, which is the case today, and does not appear septic, he could f/u with his urologist tomorrow, and Dr Carolin Sicks will also f/u on his urine culture results.  *  Gallbladder ultrasound reviewed, there is  some biliary sludge and stones and some borderline wall thickness and no other acute stigmata of cholecystitis.  The patient does not have a positive Murphy sign on exam.  He reports his symptoms were not worse after eating.  We did discuss the possibility of biliary colic, no provided biliary eating plan, and still recommend an evaluation by general surgeon.  However I think he is reasonably safe and stable for discharge.  Of note, immediately after receiving fentanyl the patient's oxygen levels did drop a bit.  He reports he does suffer from sleep apnea uses CPAP at night, and I strongly suspect that the brief oxygen requirement was related to narcosis.  He has now weaned back to room air and breathing comfortably.      [name], MD 08/25/22 1005

## 2022-08-25 NOTE — ED Provider Notes (Signed)
[facility] EMERGENCY DEPARTMENT Provider Note   CSN: [id number] Arrival date & time: 08/25/22  0530     History  Chief Complaint  Patient presents with   Abdominal Pain    Russell Bowers is a 71 y.o. male.  The history is provided by the patient and the spouse.  Abdominal Pain Pain location:  RLQ Pain quality: aching   Pain severity:  Moderate Onset quality:  Gradual Duration:  1 day Timing:  Intermittent Progression:  Worsening Chronicity:  New Associated symptoms: no dysuria and no fever   Patient with chronic medical conditions including previous renal transplant presents with abdominal pain.  He reports starting yesterday he had intermittent lower abdominal pain on the right side.  Since then it is become persistent and radiates into his right flank.  There is no groin or testicle pain.  He has had associated nausea and vomiting.  No diarrhea.  No dysuria or urinary symptoms.  No fevers, no chest pain or shortness of breath Patient reports doing well postoperatively found to his renal transplant in 2012.  He reports compliance with medications    Home Medications Prior to Admission medications   Medication Sig Start Date End Date Taking? Authorizing Provider  acetaminophen (TYLENOL) 500 MG tablet Take 1,000 mg by mouth every 6 (six) hours as needed for moderate pain.    [provider]  B-D ULTRAFINE III SHORT PEN 31G X 8 MM MISC Inject into the skin daily. 04/11/21   [provider]  calcitRIOL (ROCALTROL) 0.25 MCG capsule Take 0.25 mcg by mouth daily. 11/08/20   [provider]  cinacalcet (SENSIPAR) 30 MG tablet Take 30 mg by mouth daily.    [provider]  famotidine (PEPCID) 20 MG tablet Take 20 mg by mouth daily.    [provider]  folic acid (FOLVITE) 1 MG tablet TAKE 2 TABLETS BY MOUTH EVERY DAY 02/12/22   Volanda Napoleon, MD  furosemide (LASIX) 40 MG tablet Take 1 tablet (40 mg total) by mouth  daily. 02/22/21   Johnson, Clanford L, MD  glipiZIDE (GLUCOTROL XL) 5 MG 24 hr tablet Take 1 tablet (5 mg total) by mouth daily with breakfast. Patient not taking: Reported on 04/25/2022 02/21/21   Wynetta Emery, Clanford L, MD  glipiZIDE (GLUCOTROL) 5 MG tablet Take 5 mg by mouth daily before breakfast. 3 per day    [provider]  insulin glargine (LANTUS) 100 UNIT/ML injection Inject 0.1 mLs (10 Units total) into the skin at bedtime. 02/21/21   Johnson, Clanford L, MD  loperamide (IMODIUM) 2 MG capsule Take 2 mg by mouth as needed for diarrhea or loose stools.    [provider]  loratadine (CLARITIN) 10 MG tablet Take 10 mg by mouth daily.    [provider]  Magnesium Cl-Calcium Carbonate (SLOW-MAG PO) Take 1 tablet by mouth daily.     [provider]  magnesium oxide (MAG-OX) 400 MG tablet Take 1 tablet by mouth daily. Patient not taking: Reported on 05/17/2021 02/23/21   [provider]  metoprolol succinate (TOPROL-XL) 50 MG 24 hr tablet Take 0.5 tablets by mouth daily.     [provider]  mupirocin ointment (BACTROBAN) 2 % Apply 1 application topically 2 (two) times daily. 01/17/21   [provider]  [facility] ULTRA test strip 3 (three) times daily. 02/26/21   [provider]  pravastatin (PRAVACHOL) 10 MG tablet Take 10 mg by mouth at bedtime.  [provider]  tacrolimus (PROGRAF) 0.5 MG capsule Take 1 mg by mouth 2 (two) times daily. Reviewed bottle    [provider]  tacrolimus (PROGRAF) 1 MG capsule SMARTSIG:1 By Mouth 04/23/22   [provider]  TRADJENTA 5 MG TABS tablet Take 5 mg by mouth daily. 12/01/20   [provider]  vitamin B-12 (CYANOCOBALAMIN) 500 MCG tablet Take 500 mcg by mouth at bedtime.    [provider]      Allergies    Azathioprine and Pollen extract    Review of Systems   Review of Systems  Constitutional:  Negative for fever.  Gastrointestinal:  Positive  for abdominal pain.  Genitourinary:  Negative for dysuria.    Physical Exam Updated Vital Signs BP 139/78   Pulse (!) 107   Temp 97.8 F (36.6 C) (Oral)   Resp (!) 30   SpO2 96%  Physical Exam CONSTITUTIONAL: Chronically ill-appearing, no acute distress HEAD: Normocephalic/atraumatic EYES: EOMI/PERRL ENMT: Mucous membranes moist NECK: supple no meningeal signs SPINE/BACK:entire spine nontender CV: S1/S2 noted, no murmurs/rubs/gallops noted LUNGS: Lungs are clear to auscultation bilaterally, no apparent distress ABDOMEN: soft, mild RLQ tenderness, no rebound or guarding, bowel sounds noted throughout abdomen GU:no cva tenderness No obvious inguinal hernia NEURO: Pt is awake/alert/appropriate, moves all extremitiesx4.  No facial droop.   EXTREMITIES: pulses normal/equal, full ROM Dialysis access left arm with thrill noted SKIN: warm, color normal PSYCH: no abnormalities of mood noted, alert and oriented to situation  ED Results / Procedures / Treatments   Labs (all labs ordered are listed, but only abnormal results are displayed) Labs Reviewed  COMPREHENSIVE METABOLIC PANEL - Abnormal; Notable for the following components:      Result Value   Glucose, Bld 177 (*)    BUN 65 (*)    Creatinine, Ser 4.09 (*)    GFR, Estimated 15 (*)    All other components within normal limits  CBC - Abnormal; Notable for the following components:   MCV 102.3 (*)    Platelets 148 (*)    All other components within normal limits  URINALYSIS, ROUTINE W REFLEX MICROSCOPIC - Abnormal; Notable for the following components:   APPearance CLOUDY (*)    Glucose, UA 50 (*)    Hgb urine dipstick LARGE (*)    Protein, ur >=300 (*)    Leukocytes,Ua LARGE (*)    RBC / HPF >50 (*)    WBC, UA >50 (*)    Bacteria, UA FEW (*)    All other components within normal limits  URINE CULTURE  LIPASE, BLOOD    EKG EKG Interpretation  Date/Time:  Sunday August 25 2022 06:50:01 EDT Ventricular Rate:   102 PR Interval:  136 QRS Duration: 128 QT Interval:  382 QTC Calculation: 498 R Axis:   61 Text Interpretation: Sinus tachycardia Right bundle branch block Confirmed by Ripley Fraise [phone]) on 08/25/2022 6:52:29 AM  Radiology No results found.  Procedures Procedures    Medications Ordered in ED Medications  fentaNYL (SUBLIMAZE) injection 50 mcg (has no administration in time range)  ondansetron (ZOFRAN) injection 4 mg (has no administration in time range)    ED Course/ Medical Decision Making/ A&P Clinical Course as of 08/25/22 0657  [facility] Aug 25, 2022  0644 Creatinine(!): 4.09 Chronic renal failure [DW]  0644 Leukocytes,Ua(!): LARGE Hematuria and UTI [DW]  0656 Patient reports he does get frequent UTIs, but no recent treatment.  Also has previous history of prostate cancer [DW]  0656 Signed out to DR Trifan with CT imaging pending.  He may also need tx for UTI/Pyelo but will await CT results [DW]    Clinical Course User Index [DW] Ripley Fraise, MD                           Medical Decision Making Amount and/or Complexity of Data Reviewed Labs: ordered. Decision-making details documented in ED Course. Radiology: ordered. ECG/medicine tests: ordered.  Risk Prescription drug management.   This patient presents to the ED for concern of abdominal pain, this involves an extensive number of treatment options, and is a complaint that carries with it a high risk of complications and morbidity.  The differential diagnosis includes but is not limited to cholecystitis, cholelithiasis, pancreatitis, gastritis, peptic ulcer disease, appendicitis, bowel obstruction, bowel perforation, diverticulitis, AAA, ischemic bowel    Comorbidities that complicate the patient evaluation: Patient's presentation is complicated by their history of chronic renal failure, chronic heart failure, renal transplant   Additional history obtained: Additional history obtained from  spouse Records reviewed Care Everywhere/External Records and cardiology notes  Lab Tests: I Ordered, and personally interpreted labs.  The pertinent results include: Chronic renal failure  Imaging Studies ordered: I ordered imaging studies including CT scan abdomen pelvis    Cardiac Monitoring: The patient was maintained on a cardiac monitor.  I personally viewed and interpreted the cardiac monitor which showed an underlying rhythm of:  sinus rhythm  Medicines ordered and prescription drug management: I ordered medication including fentanyl for pain  Complexity of problems addressed: Patient's presentation is most consistent with  acute presentation with potential threat to life or bodily function          Final Clinical Impression(s) / ED Diagnoses Final diagnoses:  None    Rx / DC Orders ED Discharge Orders     None         Ripley Fraise, MD 08/25/22 [phone]

## 2022-08-25 NOTE — ED Notes (Signed)
US completed at this time.

## 2022-08-27 LAB — URINE CULTURE: Culture: >=100000 — AB

## 2022-08-28 ENCOUNTER — Telehealth: Payer: Self-pay | Admitting: *Deleted

## 2022-08-28 NOTE — Telephone Encounter (Signed)
Post ED Visit - Positive Culture Follow-up  Culture report reviewed by antimicrobial stewardship pharmacist: [place] Team '[]'$  Elenor Quinones, Pharm.D. '[]'$  Heide Guile, Pharm.D., BCPS AQ-ID '[]'$  Parks Neptune, Pharm.D., BCPS '[]'$  Alycia Rossetti, Pharm.D., BCPS '[]'$  [place], Pharm.D., BCPS, AAHIVP '[]'$  Legrand Como, Pharm.D., BCPS, AAHIVP '[]'$  Salome Arnt, PharmD, BCPS '[]'$  Johnnette Gourd, PharmD, BCPS '[]'$  Hughes Better, PharmD, BCPS '[]'$  Leeroy Cha, PharmD '[]'$  Laqueta Linden, PharmD, BCPS '[]'$  Albertina Parr, PharmD  [place] Team '[]'$  Leodis Sias, PharmD '[]'$  Lindell Spar, PharmD '[]'$  Royetta Asal, PharmD '[]'$  Graylin Shiver, Rph '[]'$  Rema Fendt) Glennon Mac, PharmD '[]'$  Arlyn Dunning, PharmD '[]'$  Netta Cedars, PharmD '[]'$  Dia Sitter, PharmD '[]'$  Leone Haven, PharmD '[]'$  Gretta Arab, PharmD '[]'$  Theodis Shove, PharmD '[]'$  Peggyann Juba, PharmD '[]'$  Reuel Boom, PharmD   Positive urine culture Treated with Cefdinir, organism sensitive to the same and no further patient follow-up is required at this time.  Reviewed by Pharmacy  Ardeen Fillers 08/28/2022, 2:54 PM

## 2022-08-29 DIAGNOSIS — D2262 Melanocytic nevi of left upper limb, including shoulder: Secondary | ICD-10-CM | POA: Diagnosis not present

## 2022-08-29 DIAGNOSIS — L821 Other seborrheic keratosis: Secondary | ICD-10-CM | POA: Diagnosis not present

## 2022-08-29 DIAGNOSIS — L814 Other melanin hyperpigmentation: Secondary | ICD-10-CM | POA: Diagnosis not present

## 2022-08-29 DIAGNOSIS — L820 Inflamed seborrheic keratosis: Secondary | ICD-10-CM | POA: Diagnosis not present

## 2022-08-29 DIAGNOSIS — D485 Neoplasm of uncertain behavior of skin: Secondary | ICD-10-CM | POA: Diagnosis not present

## 2022-08-29 DIAGNOSIS — D225 Melanocytic nevi of trunk: Secondary | ICD-10-CM | POA: Diagnosis not present

## 2022-08-29 DIAGNOSIS — C4442 Squamous cell carcinoma of skin of scalp and neck: Secondary | ICD-10-CM | POA: Diagnosis not present

## 2022-08-29 DIAGNOSIS — L570 Actinic keratosis: Secondary | ICD-10-CM | POA: Diagnosis not present

## 2022-08-29 DIAGNOSIS — C44329 Squamous cell carcinoma of skin of other parts of face: Secondary | ICD-10-CM | POA: Diagnosis not present

## 2022-08-29 DIAGNOSIS — Z85828 Personal history of other malignant neoplasm of skin: Secondary | ICD-10-CM | POA: Diagnosis not present

## 2022-08-29 DIAGNOSIS — C44629 Squamous cell carcinoma of skin of left upper limb, including shoulder: Secondary | ICD-10-CM | POA: Diagnosis not present

## 2022-09-09 DIAGNOSIS — J9610 Chronic respiratory failure, unspecified whether with hypoxia or hypercapnia: Secondary | ICD-10-CM | POA: Diagnosis not present

## 2022-09-12 DIAGNOSIS — N390 Urinary tract infection, site not specified: Secondary | ICD-10-CM | POA: Diagnosis not present

## 2022-09-17 DIAGNOSIS — L570 Actinic keratosis: Secondary | ICD-10-CM | POA: Diagnosis not present

## 2022-09-17 DIAGNOSIS — D485 Neoplasm of uncertain behavior of skin: Secondary | ICD-10-CM | POA: Diagnosis not present

## 2022-09-18 DIAGNOSIS — Z794 Long term (current) use of insulin: Secondary | ICD-10-CM | POA: Diagnosis not present

## 2022-09-18 DIAGNOSIS — E119 Type 2 diabetes mellitus without complications: Secondary | ICD-10-CM | POA: Diagnosis not present

## 2022-09-18 DIAGNOSIS — N390 Urinary tract infection, site not specified: Secondary | ICD-10-CM | POA: Diagnosis not present

## 2022-09-18 DIAGNOSIS — Z87440 Personal history of urinary (tract) infections: Secondary | ICD-10-CM | POA: Diagnosis not present

## 2022-09-18 DIAGNOSIS — Z7952 Long term (current) use of systemic steroids: Secondary | ICD-10-CM | POA: Diagnosis not present

## 2022-09-18 DIAGNOSIS — Z4822 Encounter for aftercare following kidney transplant: Secondary | ICD-10-CM | POA: Diagnosis not present

## 2022-09-18 DIAGNOSIS — Z85828 Personal history of other malignant neoplasm of skin: Secondary | ICD-10-CM | POA: Diagnosis not present

## 2022-09-18 DIAGNOSIS — E139 Other specified diabetes mellitus without complications: Secondary | ICD-10-CM | POA: Diagnosis not present

## 2022-09-18 DIAGNOSIS — D84821 Immunodeficiency due to drugs: Secondary | ICD-10-CM | POA: Diagnosis not present

## 2022-09-18 DIAGNOSIS — Z5181 Encounter for therapeutic drug level monitoring: Secondary | ICD-10-CM | POA: Diagnosis not present

## 2022-09-18 DIAGNOSIS — I10 Essential (primary) hypertension: Secondary | ICD-10-CM | POA: Diagnosis not present

## 2022-09-18 DIAGNOSIS — E785 Hyperlipidemia, unspecified: Secondary | ICD-10-CM | POA: Diagnosis not present

## 2022-09-18 DIAGNOSIS — Z7984 Long term (current) use of oral hypoglycemic drugs: Secondary | ICD-10-CM | POA: Diagnosis not present

## 2022-09-18 DIAGNOSIS — D61818 Other pancytopenia: Secondary | ICD-10-CM | POA: Diagnosis not present

## 2022-09-18 DIAGNOSIS — Z79899 Other long term (current) drug therapy: Secondary | ICD-10-CM | POA: Diagnosis not present

## 2022-09-18 DIAGNOSIS — Z23 Encounter for immunization: Secondary | ICD-10-CM | POA: Diagnosis not present

## 2022-09-18 DIAGNOSIS — G4733 Obstructive sleep apnea (adult) (pediatric): Secondary | ICD-10-CM | POA: Diagnosis not present

## 2022-09-18 DIAGNOSIS — Z79621 Long term (current) use of calcineurin inhibitor: Secondary | ICD-10-CM | POA: Diagnosis not present

## 2022-10-16 DIAGNOSIS — I5020 Unspecified systolic (congestive) heart failure: Secondary | ICD-10-CM | POA: Diagnosis not present

## 2022-10-16 DIAGNOSIS — E538 Deficiency of other specified B group vitamins: Secondary | ICD-10-CM | POA: Diagnosis not present

## 2022-10-16 DIAGNOSIS — E7800 Pure hypercholesterolemia, unspecified: Secondary | ICD-10-CM | POA: Diagnosis not present

## 2022-10-16 DIAGNOSIS — Z794 Long term (current) use of insulin: Secondary | ICD-10-CM | POA: Diagnosis not present

## 2022-10-16 DIAGNOSIS — E782 Mixed hyperlipidemia: Secondary | ICD-10-CM | POA: Diagnosis not present

## 2022-10-16 DIAGNOSIS — E1122 Type 2 diabetes mellitus with diabetic chronic kidney disease: Secondary | ICD-10-CM | POA: Diagnosis not present

## 2022-11-21 DIAGNOSIS — H35371 Puckering of macula, right eye: Secondary | ICD-10-CM | POA: Diagnosis not present

## 2022-11-21 DIAGNOSIS — Z961 Presence of intraocular lens: Secondary | ICD-10-CM | POA: Diagnosis not present

## 2022-11-21 DIAGNOSIS — H40013 Open angle with borderline findings, low risk, bilateral: Secondary | ICD-10-CM | POA: Diagnosis not present

## 2022-11-21 DIAGNOSIS — E119 Type 2 diabetes mellitus without complications: Secondary | ICD-10-CM | POA: Diagnosis not present

## 2022-11-21 DIAGNOSIS — Z9889 Other specified postprocedural states: Secondary | ICD-10-CM | POA: Diagnosis not present

## 2022-12-02 DIAGNOSIS — N390 Urinary tract infection, site not specified: Secondary | ICD-10-CM | POA: Diagnosis not present

## 2022-12-02 DIAGNOSIS — Z940 Kidney transplant status: Secondary | ICD-10-CM | POA: Diagnosis not present

## 2022-12-04 DIAGNOSIS — D225 Melanocytic nevi of trunk: Secondary | ICD-10-CM | POA: Diagnosis not present

## 2022-12-04 DIAGNOSIS — Z85828 Personal history of other malignant neoplasm of skin: Secondary | ICD-10-CM | POA: Diagnosis not present

## 2022-12-04 DIAGNOSIS — D0461 Carcinoma in situ of skin of right upper limb, including shoulder: Secondary | ICD-10-CM | POA: Diagnosis not present

## 2022-12-04 DIAGNOSIS — L814 Other melanin hyperpigmentation: Secondary | ICD-10-CM | POA: Diagnosis not present

## 2022-12-04 DIAGNOSIS — L821 Other seborrheic keratosis: Secondary | ICD-10-CM | POA: Diagnosis not present

## 2022-12-04 DIAGNOSIS — L570 Actinic keratosis: Secondary | ICD-10-CM | POA: Diagnosis not present

## 2022-12-09 DIAGNOSIS — J9610 Chronic respiratory failure, unspecified whether with hypoxia or hypercapnia: Secondary | ICD-10-CM | POA: Diagnosis not present

## 2022-12-09 DIAGNOSIS — E118 Type 2 diabetes mellitus with unspecified complications: Secondary | ICD-10-CM | POA: Diagnosis not present

## 2022-12-09 DIAGNOSIS — N184 Chronic kidney disease, stage 4 (severe): Secondary | ICD-10-CM | POA: Diagnosis not present

## 2022-12-09 DIAGNOSIS — E875 Hyperkalemia: Secondary | ICD-10-CM | POA: Diagnosis not present

## 2022-12-09 DIAGNOSIS — N2581 Secondary hyperparathyroidism of renal origin: Secondary | ICD-10-CM | POA: Diagnosis not present

## 2022-12-09 DIAGNOSIS — Z940 Kidney transplant status: Secondary | ICD-10-CM | POA: Diagnosis not present

## 2022-12-09 DIAGNOSIS — D631 Anemia in chronic kidney disease: Secondary | ICD-10-CM | POA: Diagnosis not present

## 2022-12-09 DIAGNOSIS — I129 Hypertensive chronic kidney disease with stage 1 through stage 4 chronic kidney disease, or unspecified chronic kidney disease: Secondary | ICD-10-CM | POA: Diagnosis not present

## 2023-01-03 DIAGNOSIS — E1122 Type 2 diabetes mellitus with diabetic chronic kidney disease: Secondary | ICD-10-CM | POA: Diagnosis not present

## 2023-01-03 DIAGNOSIS — Z794 Long term (current) use of insulin: Secondary | ICD-10-CM | POA: Diagnosis not present

## 2023-01-03 DIAGNOSIS — I519 Heart disease, unspecified: Secondary | ICD-10-CM | POA: Diagnosis not present

## 2023-01-03 DIAGNOSIS — G4733 Obstructive sleep apnea (adult) (pediatric): Secondary | ICD-10-CM | POA: Diagnosis not present

## 2023-01-03 DIAGNOSIS — Z03818 Encounter for observation for suspected exposure to other biological agents ruled out: Secondary | ICD-10-CM | POA: Diagnosis not present

## 2023-01-03 DIAGNOSIS — Z940 Kidney transplant status: Secondary | ICD-10-CM | POA: Diagnosis not present

## 2023-01-03 DIAGNOSIS — R051 Acute cough: Secondary | ICD-10-CM | POA: Diagnosis not present

## 2023-01-14 ENCOUNTER — Encounter (HOSPITAL_COMMUNITY): Payer: Self-pay

## 2023-01-14 ENCOUNTER — Other Ambulatory Visit: Payer: Self-pay

## 2023-01-14 ENCOUNTER — Encounter (HOSPITAL_BASED_OUTPATIENT_CLINIC_OR_DEPARTMENT_OTHER): Payer: Self-pay

## 2023-01-14 ENCOUNTER — Emergency Department (HOSPITAL_BASED_OUTPATIENT_CLINIC_OR_DEPARTMENT_OTHER): Payer: Medicare Other

## 2023-01-14 ENCOUNTER — Inpatient Hospital Stay (HOSPITAL_BASED_OUTPATIENT_CLINIC_OR_DEPARTMENT_OTHER)
Admission: EM | Admit: 2023-01-14 | Discharge: 2023-01-30 | DRG: 871 | Disposition: A | Discharge status: Home Health | Payer: Medicare Other | Attending: Internal Medicine | Admitting: Internal Medicine

## 2023-01-14 DIAGNOSIS — E440 Moderate protein-calorie malnutrition: Secondary | ICD-10-CM | POA: Diagnosis present

## 2023-01-14 DIAGNOSIS — R918 Other nonspecific abnormal finding of lung field: Secondary | ICD-10-CM | POA: Diagnosis not present

## 2023-01-14 DIAGNOSIS — R0602 Shortness of breath: Secondary | ICD-10-CM | POA: Diagnosis not present

## 2023-01-14 DIAGNOSIS — Z820 Family history of epilepsy and other diseases of the nervous system: Secondary | ICD-10-CM

## 2023-01-14 DIAGNOSIS — E1122 Type 2 diabetes mellitus with diabetic chronic kidney disease: Secondary | ICD-10-CM

## 2023-01-14 DIAGNOSIS — Z7984 Long term (current) use of oral hypoglycemic drugs: Secondary | ICD-10-CM

## 2023-01-14 DIAGNOSIS — E119 Type 2 diabetes mellitus without complications: Secondary | ICD-10-CM

## 2023-01-14 DIAGNOSIS — J439 Emphysema, unspecified: Secondary | ICD-10-CM | POA: Diagnosis not present

## 2023-01-14 DIAGNOSIS — I482 Chronic atrial fibrillation, unspecified: Secondary | ICD-10-CM | POA: Diagnosis present

## 2023-01-14 DIAGNOSIS — A4189 Other specified sepsis: Principal | ICD-10-CM | POA: Diagnosis present

## 2023-01-14 DIAGNOSIS — Z992 Dependence on renal dialysis: Secondary | ICD-10-CM

## 2023-01-14 DIAGNOSIS — Z794 Long term (current) use of insulin: Secondary | ICD-10-CM

## 2023-01-14 DIAGNOSIS — J811 Chronic pulmonary edema: Secondary | ICD-10-CM | POA: Diagnosis not present

## 2023-01-14 DIAGNOSIS — J9809 Other diseases of bronchus, not elsewhere classified: Secondary | ICD-10-CM | POA: Diagnosis not present

## 2023-01-14 DIAGNOSIS — Y830 Surgical operation with transplant of whole organ as the cause of abnormal reaction of the patient, or of later complication, without mention of misadventure at the time of the procedure: Secondary | ICD-10-CM | POA: Diagnosis present

## 2023-01-14 DIAGNOSIS — R188 Other ascites: Secondary | ICD-10-CM | POA: Diagnosis present

## 2023-01-14 DIAGNOSIS — T8619 Other complication of kidney transplant: Secondary | ICD-10-CM | POA: Diagnosis not present

## 2023-01-14 DIAGNOSIS — D649 Anemia, unspecified: Secondary | ICD-10-CM | POA: Diagnosis not present

## 2023-01-14 DIAGNOSIS — E785 Hyperlipidemia, unspecified: Secondary | ICD-10-CM | POA: Diagnosis not present

## 2023-01-14 DIAGNOSIS — N185 Chronic kidney disease, stage 5: Secondary | ICD-10-CM | POA: Diagnosis not present

## 2023-01-14 DIAGNOSIS — Z833 Family history of diabetes mellitus: Secondary | ICD-10-CM

## 2023-01-14 DIAGNOSIS — N189 Chronic kidney disease, unspecified: Secondary | ICD-10-CM | POA: Diagnosis present

## 2023-01-14 DIAGNOSIS — J9621 Acute and chronic respiratory failure with hypoxia: Secondary | ICD-10-CM | POA: Diagnosis not present

## 2023-01-14 DIAGNOSIS — R7989 Other specified abnormal findings of blood chemistry: Secondary | ICD-10-CM | POA: Diagnosis not present

## 2023-01-14 DIAGNOSIS — G4733 Obstructive sleep apnea (adult) (pediatric): Secondary | ICD-10-CM | POA: Diagnosis not present

## 2023-01-14 DIAGNOSIS — K5900 Constipation, unspecified: Secondary | ICD-10-CM | POA: Diagnosis not present

## 2023-01-14 DIAGNOSIS — U071 COVID-19: Secondary | ICD-10-CM

## 2023-01-14 DIAGNOSIS — I272 Pulmonary hypertension, unspecified: Secondary | ICD-10-CM | POA: Diagnosis not present

## 2023-01-14 DIAGNOSIS — J9622 Acute and chronic respiratory failure with hypercapnia: Secondary | ICD-10-CM | POA: Diagnosis present

## 2023-01-14 DIAGNOSIS — R059 Cough, unspecified: Secondary | ICD-10-CM | POA: Diagnosis not present

## 2023-01-14 DIAGNOSIS — E871 Hypo-osmolality and hyponatremia: Secondary | ICD-10-CM | POA: Diagnosis present

## 2023-01-14 DIAGNOSIS — Z940 Kidney transplant status: Secondary | ICD-10-CM | POA: Diagnosis not present

## 2023-01-14 DIAGNOSIS — I10 Essential (primary) hypertension: Secondary | ICD-10-CM | POA: Diagnosis not present

## 2023-01-14 DIAGNOSIS — F909 Attention-deficit hyperactivity disorder, unspecified type: Secondary | ICD-10-CM | POA: Diagnosis present

## 2023-01-14 DIAGNOSIS — I132 Hypertensive heart and chronic kidney disease with heart failure and with stage 5 chronic kidney disease, or end stage renal disease: Secondary | ICD-10-CM | POA: Diagnosis not present

## 2023-01-14 DIAGNOSIS — Z781 Physical restraint status: Secondary | ICD-10-CM

## 2023-01-14 DIAGNOSIS — D849 Immunodeficiency, unspecified: Secondary | ICD-10-CM | POA: Diagnosis not present

## 2023-01-14 DIAGNOSIS — D696 Thrombocytopenia, unspecified: Secondary | ICD-10-CM | POA: Diagnosis not present

## 2023-01-14 DIAGNOSIS — Z8249 Family history of ischemic heart disease and other diseases of the circulatory system: Secondary | ICD-10-CM

## 2023-01-14 DIAGNOSIS — N390 Urinary tract infection, site not specified: Secondary | ICD-10-CM

## 2023-01-14 DIAGNOSIS — J810 Acute pulmonary edema: Secondary | ICD-10-CM | POA: Diagnosis not present

## 2023-01-14 DIAGNOSIS — R54 Age-related physical debility: Secondary | ICD-10-CM | POA: Diagnosis present

## 2023-01-14 DIAGNOSIS — I352 Nonrheumatic aortic (valve) stenosis with insufficiency: Secondary | ICD-10-CM | POA: Diagnosis present

## 2023-01-14 DIAGNOSIS — I4891 Unspecified atrial fibrillation: Secondary | ICD-10-CM | POA: Diagnosis not present

## 2023-01-14 DIAGNOSIS — T8612 Kidney transplant failure: Secondary | ICD-10-CM | POA: Diagnosis present

## 2023-01-14 DIAGNOSIS — D6959 Other secondary thrombocytopenia: Secondary | ICD-10-CM | POA: Diagnosis present

## 2023-01-14 DIAGNOSIS — I5033 Acute on chronic diastolic (congestive) heart failure: Secondary | ICD-10-CM | POA: Diagnosis not present

## 2023-01-14 DIAGNOSIS — M7989 Other specified soft tissue disorders: Secondary | ICD-10-CM | POA: Diagnosis not present

## 2023-01-14 DIAGNOSIS — E876 Hypokalemia: Secondary | ICD-10-CM | POA: Diagnosis not present

## 2023-01-14 DIAGNOSIS — N179 Acute kidney failure, unspecified: Secondary | ICD-10-CM | POA: Diagnosis not present

## 2023-01-14 DIAGNOSIS — R578 Other shock: Secondary | ICD-10-CM | POA: Diagnosis not present

## 2023-01-14 DIAGNOSIS — Z79899 Other long term (current) drug therapy: Secondary | ICD-10-CM

## 2023-01-14 DIAGNOSIS — N186 End stage renal disease: Secondary | ICD-10-CM | POA: Diagnosis present

## 2023-01-14 DIAGNOSIS — N184 Chronic kidney disease, stage 4 (severe): Secondary | ICD-10-CM | POA: Diagnosis not present

## 2023-01-14 DIAGNOSIS — J9811 Atelectasis: Secondary | ICD-10-CM | POA: Diagnosis not present

## 2023-01-14 DIAGNOSIS — Z8546 Personal history of malignant neoplasm of prostate: Secondary | ICD-10-CM

## 2023-01-14 DIAGNOSIS — R410 Disorientation, unspecified: Secondary | ICD-10-CM | POA: Diagnosis not present

## 2023-01-14 DIAGNOSIS — D631 Anemia in chronic kidney disease: Secondary | ICD-10-CM

## 2023-01-14 DIAGNOSIS — R0902 Hypoxemia: Secondary | ICD-10-CM | POA: Diagnosis not present

## 2023-01-14 DIAGNOSIS — I120 Hypertensive chronic kidney disease with stage 5 chronic kidney disease or end stage renal disease: Secondary | ICD-10-CM | POA: Diagnosis not present

## 2023-01-14 DIAGNOSIS — A419 Sepsis, unspecified organism: Secondary | ICD-10-CM | POA: Diagnosis not present

## 2023-01-14 DIAGNOSIS — J9602 Acute respiratory failure with hypercapnia: Secondary | ICD-10-CM

## 2023-01-14 DIAGNOSIS — I4510 Unspecified right bundle-branch block: Secondary | ICD-10-CM | POA: Diagnosis present

## 2023-01-14 DIAGNOSIS — E1169 Type 2 diabetes mellitus with other specified complication: Secondary | ICD-10-CM | POA: Diagnosis not present

## 2023-01-14 DIAGNOSIS — R6520 Severe sepsis without septic shock: Secondary | ICD-10-CM | POA: Diagnosis present

## 2023-01-14 DIAGNOSIS — J90 Pleural effusion, not elsewhere classified: Secondary | ICD-10-CM | POA: Diagnosis not present

## 2023-01-14 DIAGNOSIS — Z6825 Body mass index (BMI) 25.0-25.9, adult: Secondary | ICD-10-CM

## 2023-01-14 DIAGNOSIS — I480 Paroxysmal atrial fibrillation: Secondary | ICD-10-CM | POA: Diagnosis not present

## 2023-01-14 DIAGNOSIS — R579 Shock, unspecified: Secondary | ICD-10-CM | POA: Diagnosis not present

## 2023-01-14 DIAGNOSIS — R6521 Severe sepsis with septic shock: Secondary | ICD-10-CM | POA: Diagnosis not present

## 2023-01-14 DIAGNOSIS — Z0184 Encounter for antibody response examination: Secondary | ICD-10-CM

## 2023-01-14 DIAGNOSIS — J9601 Acute respiratory failure with hypoxia: Principal | ICD-10-CM

## 2023-01-14 DIAGNOSIS — I441 Atrioventricular block, second degree: Secondary | ICD-10-CM | POA: Diagnosis not present

## 2023-01-14 DIAGNOSIS — G9341 Metabolic encephalopathy: Secondary | ICD-10-CM | POA: Diagnosis not present

## 2023-01-14 DIAGNOSIS — T426X5A Adverse effect of other antiepileptic and sedative-hypnotic drugs, initial encounter: Secondary | ICD-10-CM | POA: Diagnosis not present

## 2023-01-14 DIAGNOSIS — J1282 Pneumonia due to coronavirus disease 2019: Secondary | ICD-10-CM

## 2023-01-14 DIAGNOSIS — I9589 Other hypotension: Secondary | ICD-10-CM | POA: Diagnosis present

## 2023-01-14 DIAGNOSIS — Z7960 Long term (current) use of unspecified immunomodulators and immunosuppressants: Secondary | ICD-10-CM

## 2023-01-14 DIAGNOSIS — I5032 Chronic diastolic (congestive) heart failure: Secondary | ICD-10-CM

## 2023-01-14 DIAGNOSIS — Z87891 Personal history of nicotine dependence: Secondary | ICD-10-CM

## 2023-01-14 DIAGNOSIS — J9690 Respiratory failure, unspecified, unspecified whether with hypoxia or hypercapnia: Secondary | ICD-10-CM | POA: Diagnosis not present

## 2023-01-14 DIAGNOSIS — M109 Gout, unspecified: Secondary | ICD-10-CM | POA: Diagnosis present

## 2023-01-14 DIAGNOSIS — E869 Volume depletion, unspecified: Secondary | ICD-10-CM | POA: Diagnosis not present

## 2023-01-14 DIAGNOSIS — E1165 Type 2 diabetes mellitus with hyperglycemia: Secondary | ICD-10-CM | POA: Diagnosis present

## 2023-01-14 DIAGNOSIS — E875 Hyperkalemia: Secondary | ICD-10-CM | POA: Diagnosis present

## 2023-01-14 DIAGNOSIS — Z9079 Acquired absence of other genital organ(s): Secondary | ICD-10-CM

## 2023-01-14 LAB — COMPREHENSIVE METABOLIC PANEL
ALT: 12 U/L (ref 0–44)
AST: 11 U/L — ABNORMAL LOW (ref 15–41)
Albumin: 4 g/dL (ref 3.5–5.0)
Alkaline Phosphatase: 74 U/L (ref 38–126)
Anion gap: 9 (ref 5–15)
BUN: 81 mg/dL — ABNORMAL HIGH (ref 8–23)
CO2: 27 mmol/L (ref 22–32)
Calcium: 9.8 mg/dL (ref 8.9–10.3)
Chloride: 100 mmol/L (ref 98–111)
Creatinine, Ser: 5.37 mg/dL — ABNORMAL HIGH (ref 0.61–1.24)
GFR, Estimated: 11 mL/min — ABNORMAL LOW (ref >60)
Glucose, Bld: 192 mg/dL — ABNORMAL HIGH (ref 70–99)
Potassium: 4.9 mmol/L (ref 3.5–5.1)
Sodium: 136 mmol/L (ref 135–145)
Total Bilirubin: 0.7 mg/dL (ref 0.3–1.2)
Total Protein: 7.4 g/dL (ref 6.5–8.1)

## 2023-01-14 LAB — URINALYSIS, ROUTINE W REFLEX MICROSCOPIC
Bilirubin Urine: NEGATIVE
Glucose, UA: 100 mg/dL — AB
Ketones, ur: NEGATIVE mg/dL
Nitrite: NEGATIVE
Protein, ur: 100 mg/dL — AB
Specific Gravity, Urine: 1.01 (ref 1.005–1.030)
WBC, UA: >50 WBC/hpf — ABNORMAL HIGH (ref 0–5)
pH: 5.5 (ref 5.0–8.0)

## 2023-01-14 LAB — LACTIC ACID, PLASMA: Lactic Acid, Venous: 0.7 mmol/L (ref 0.5–1.9)

## 2023-01-14 LAB — CBC WITH DIFFERENTIAL/PLATELET
Abs Immature Granulocytes: 0.03 10*3/uL (ref 0.00–0.07)
Basophils Absolute: 0 10*3/uL (ref 0.0–0.1)
Basophils Relative: 1 %
Eosinophils Absolute: 0.1 10*3/uL (ref 0.0–0.5)
Eosinophils Relative: 1 %
HCT: 48.7 % (ref 39.0–52.0)
Hemoglobin: 14.8 g/dL (ref 13.0–17.0)
Immature Granulocytes: 1 %
Lymphocytes Relative: 7 %
Lymphs Abs: 0.4 10*3/uL — ABNORMAL LOW (ref 0.7–4.0)
MCH: 31.9 pg (ref 26.0–34.0)
MCHC: 30.4 g/dL (ref 30.0–36.0)
MCV: 105 fL — ABNORMAL HIGH (ref 80.0–100.0)
Monocytes Absolute: 0.4 10*3/uL (ref 0.1–1.0)
Monocytes Relative: 7 %
Neutro Abs: 5 10*3/uL (ref 1.7–7.7)
Neutrophils Relative %: 83 %
Platelets: 138 10*3/uL — ABNORMAL LOW (ref 150–400)
RBC: 4.64 MIL/uL (ref 4.22–5.81)
RDW: 16.5 % — ABNORMAL HIGH (ref 11.5–15.5)
WBC: 5.9 10*3/uL (ref 4.0–10.5)
nRBC: 0 % (ref 0.0–0.2)

## 2023-01-14 LAB — I-STAT VENOUS BLOOD GAS, ED
Acid-Base Excess: 0 mmol/L (ref 0.0–2.0)
Bicarbonate: 30.8 mmol/L — ABNORMAL HIGH (ref 20.0–28.0)
Calcium, Ion: 1.29 mmol/L (ref 1.15–1.40)
HCT: 48 % (ref 39.0–52.0)
Hemoglobin: 16.3 g/dL (ref 13.0–17.0)
O2 Saturation: 21 %
Patient temperature: 97.5
Potassium: 5 mmol/L (ref 3.5–5.1)
Sodium: 137 mmol/L (ref 135–145)
TCO2: 33 mmol/L — ABNORMAL HIGH (ref 22–32)
pCO2, Ven: 77.7 mmHg (ref 44–60)
pH, Ven: 7.202 — ABNORMAL LOW (ref 7.25–7.43)
pO2, Ven: 19 mmHg — CL (ref 32–45)

## 2023-01-14 LAB — RESP PANEL BY RT-PCR (RSV, FLU A&B, COVID)  RVPGX2
Influenza A by PCR: NEGATIVE
Influenza B by PCR: NEGATIVE
Resp Syncytial Virus by PCR: NEGATIVE
SARS Coronavirus 2 by RT PCR: POSITIVE — AB

## 2023-01-14 LAB — BRAIN NATRIURETIC PEPTIDE: B Natriuretic Peptide: 1704 pg/mL — ABNORMAL HIGH (ref 0.0–100.0)

## 2023-01-14 LAB — TROPONIN I (HIGH SENSITIVITY)
Troponin I (High Sensitivity): 23 ng/L — ABNORMAL HIGH (ref <18)
Troponin I (High Sensitivity): 22 ng/L — ABNORMAL HIGH (ref <18)

## 2023-01-14 LAB — CBG MONITORING, ED: Glucose-Capillary: 161 mg/dL — ABNORMAL HIGH (ref 70–99)

## 2023-01-14 MED ORDER — MUPIROCIN 2 % EX OINT: 1.0000 | TOPICAL_OINTMENT | Freq: Two times a day (BID) | CUTANEOUS | Status: DC

## 2023-01-14 MED ORDER — SODIUM CHLORIDE 0.9 % IV SOLN: INTRAVENOUS | Status: DC | PRN

## 2023-01-14 MED ORDER — ORAL CARE MOUTH RINSE
15.0000 mL | OROMUCOSAL | Status: DC
  Administered 2023-01-15 – 2023-01-16 (×8): 15 mL via OROMUCOSAL

## 2023-01-14 MED ORDER — FUROSEMIDE 10 MG/ML IJ SOLN
80.0000 mg | Freq: Once | INTRAMUSCULAR | Status: AC
  Administered 2023-01-14: 80 mg via INTRAVENOUS
  Filled 2023-01-14: qty 8

## 2023-01-14 MED ORDER — DEXAMETHASONE SODIUM PHOSPHATE 10 MG/ML IJ SOLN
6.0000 mg | Freq: Once | INTRAMUSCULAR | Status: AC
  Administered 2023-01-14: 6 mg via INTRAVENOUS
  Filled 2023-01-14: qty 1

## 2023-01-14 MED ORDER — SODIUM CHLORIDE 0.9 % IV SOLN
2.0000 g | Freq: Once | INTRAVENOUS | Status: AC
  Administered 2023-01-14: 2 g via INTRAVENOUS
  Filled 2023-01-14: qty 20

## 2023-01-14 MED ORDER — CHLORHEXIDINE GLUCONATE CLOTH 2 % EX PADS
6.0000 | MEDICATED_PAD | Freq: Every day | CUTANEOUS | Status: AC
  Administered 2023-01-15 – 2023-01-19 (×5): 6 via TOPICAL
  Filled 2023-01-14: qty 6

## 2023-01-14 MED ORDER — ORAL CARE MOUTH RINSE
15.0000 mL | OROMUCOSAL | Status: DC | PRN
  Filled 2023-01-14: qty 15

## 2023-01-14 MED ORDER — FUROSEMIDE 10 MG/ML IJ SOLN
40.0000 mg | Freq: Once | INTRAMUSCULAR | Status: DC
  Filled 2023-01-14: qty 4

## 2023-01-14 MED ORDER — SODIUM CHLORIDE 0.9 % IV SOLN
500.0000 mg | Freq: Once | INTRAVENOUS | Status: AC
  Administered 2023-01-14: 500 mg via INTRAVENOUS
  Filled 2023-01-14: qty 5

## 2023-01-14 NOTE — ED Notes (Signed)
Pt awake and alert sitting up in bed - GCS 15.  Spouse at bedside.  Pt has been given tv dinner per request (ate only 10% of meal).  Pt HOH otherwise no other deficits.  RR even and unlabored on 3L O2 via [place] at this time with symmetrical rise and fall of chest; no coughing noted while this nurse at bedside. - pt remains on suppl nasal cannula (3L at this time) with continuous cardiac and pulse ox maintained.  Pt denies any c/o sob and denies cp.  Pt agreeable with admit plan.  This nurse will continue to monitor for acute changes and maintain plan of care as pt awaits bed assignment.

## 2023-01-14 NOTE — ED Notes (Signed)
Report given to [facility].

## 2023-01-14 NOTE — ED Triage Notes (Signed)
Pt c/o cough, fatigue, and restlessness x "weeks."  Denies pain.  Pt reports SOB w/ exertion and found to be hypoxic at 78% RA.  Pt 90-91% on 3L .

## 2023-01-14 NOTE — Subjective & Objective (Signed)
Presented with cough and SOB for the past 1 wk  No fever no chills Uses BIpap for sleep at baseline  No hx of PE/DVT  Hxof diastolic CHF for which he is on lasix   His weight has been going up and he have had decreased urine output

## 2023-01-14 NOTE — ED Provider Notes (Signed)
[place] Provider Note   CSN: [id number] Arrival date & time: 01/14/23  1143     History  Chief Complaint  Patient presents with   Fatigue   Cough    Russell Bowers is a 72 y.o. male.  With PMH of chronic diastolic heart failure, DM2, history of renal transplant on tacrolimus, aortic stenosis, OSA on BiPAP presenting with worsening shortness of breath and cough over the past 1 to 2 weeks.  he has been having dry cough that is persistent with the symptoms and was worked up for viral causes of his symptoms but Being told that they were all negative.  He has had no fevers with it no chest pain with no vomiting however he does note associated shortness of breath at rest and on ambulation.  He uses his BiPAP at night but has not been getting good sleep.  Unsure if it is just from feeling unwell or from breathing.  He has had some increased swelling in lower extremities somewhat worse on the right.  No history of PE or DVT not on anticoagulation.  He is taking his Lasix daily however he has been peeing less and notes some change in color of urination and concerned that he is still putting on weight despite not eating or drinking very much with the symptoms.  He has not been on any antibiotics.   Cough      Home Medications Prior to Admission medications   Medication Sig Start Date End Date Taking? Authorizing Provider  allopurinol (ZYLOPRIM) 100 MG tablet Take 100 mg by mouth daily. 07/30/22  Yes [provider]  calcitRIOL (ROCALTROL) 0.25 MCG capsule Take 0.25 mcg by mouth daily. 11/08/20  Yes [provider]  cinacalcet (SENSIPAR) 30 MG tablet Take 30 mg by mouth every other day.   Yes [provider]  docusate sodium (COLACE) 100 MG capsule Take 100 mg by mouth daily.   Yes [provider]  famotidine (PEPCID) 20 MG tablet Take 20 mg by mouth daily.   Yes [provider]  folic acid (FOLVITE) 1  MG tablet TAKE 2 TABLETS BY MOUTH EVERY DAY 02/12/22  Yes Ennever, Rudell Cobb, MD  furosemide (LASIX) 40 MG tablet Take 1 tablet (40 mg total) by mouth daily. 02/22/21  Yes Johnson, Clanford L, MD  glipiZIDE (GLUCOTROL) 5 MG tablet Take 5 mg by mouth daily before breakfast. 3 per day, 2 qam and 1 pm daily   Yes [provider]  insulin glargine (LANTUS) 100 UNIT/ML injection Inject 0.1 mLs (10 Units total) into the skin at bedtime. Patient taking differently: Inject 18-20 Units into the skin at bedtime. 02/21/21  Yes Johnson, Clanford L, MD  loperamide (IMODIUM) 2 MG capsule Take 2 mg by mouth as needed for diarrhea or loose stools.   Yes [provider]  loratadine (CLARITIN) 10 MG tablet Take 10 mg by mouth daily.   Yes [provider]  Magnesium Cl-Calcium Carbonate (SLOW-MAG PO) Take 1 tablet by mouth daily.    Yes [provider]  metoprolol succinate (TOPROL-XL) 50 MG 24 hr tablet Take 50 mg by mouth daily.   Yes [provider]  pravastatin (PRAVACHOL) 10 MG tablet Take 10 mg by mouth at bedtime.    Yes [provider]  tacrolimus (PROGRAF) 0.5 MG capsule Take 1 mg by mouth See admin instructions. 2 qam and 3 qpm   Yes [provider]  vitamin B-12 (CYANOCOBALAMIN) 500  MCG tablet Take 1,000 mcg by mouth at bedtime.   Yes [provider]  B-D ULTRAFINE III SHORT PEN 31G X 8 MM MISC Inject into the skin daily. 04/11/21   [provider]  glipiZIDE (GLUCOTROL XL) 5 MG 24 hr tablet Take 1 tablet (5 mg total) by mouth daily with breakfast. Patient not taking: Reported on 04/25/2022 02/21/21   Murlean Iba, MD  [facility] ULTRA test strip 3 (three) times daily. 02/26/21   [provider]      Allergies    Azathioprine and Pollen extract    Review of Systems   Review of Systems  Respiratory:  Positive for cough.     Physical Exam Updated Vital Signs BP 110/60   Pulse 74   Temp (!) 97.3 F (36.3 C) (Oral)    Resp (!) 22   Ht '5\' 9"'$  (1.753 m)   Wt 83 kg   SpO2 94%   BMI 27.02 kg/m  Physical Exam Constitutional: Alert and oriented. Ill appearing but mentating appropriately Eyes: Conjunctivae are normal. ENT      Neck: No stridor. Cardiovascular: S1, S2,  regular rate.Warm and well perfused. Respiratory:tachypneic with decreased aeration diffusely with associated bilateral fine crackles satting low 70s on RA Gastrointestinal: Soft and nontender.  Musculoskeletal: Normal range of motion in all extremities. 1+ pitting edema of BLEs, RLE slightly larger than right Neurologic: Normal speech and language. No gross focal neurologic deficits are appreciated. Skin: Skin is warm, dry and intact. No rash noted. Psychiatric: Mood and affect are normal. Speech and behavior are normal.  ED Results / Procedures / Treatments   Labs (all labs ordered are listed, but only abnormal results are displayed) Labs Reviewed  RESP PANEL BY RT-PCR (RSV, FLU A&B, COVID)  [id number] - Abnormal; Notable for the following components:      Result Value   SARS Coronavirus 2 by RT PCR POSITIVE (*)    All other components within normal limits  COMPREHENSIVE METABOLIC PANEL - Abnormal; Notable for the following components:   Glucose, Bld 192 (*)    BUN 81 (*)    Creatinine, Ser 5.37 (*)    AST 11 (*)    GFR, Estimated 11 (*)    All other components within normal limits  CBC WITH DIFFERENTIAL/PLATELET - Abnormal; Notable for the following components:   MCV 105.0 (*)    RDW 16.5 (*)    Platelets 138 (*)    Lymphs Abs 0.4 (*)    All other components within normal limits  BRAIN NATRIURETIC PEPTIDE - Abnormal; Notable for the following components:   B Natriuretic Peptide 1,704.0 (*)    All other components within normal limits  URINALYSIS, ROUTINE W REFLEX MICROSCOPIC - Abnormal; Notable for the following components:   Glucose, UA 100 (*)    Hgb urine dipstick SMALL (*)    Protein, ur 100 (*)    Leukocytes,Ua LARGE (*)     WBC, UA >50 (*)    Bacteria, UA MANY (*)    All other components within normal limits  I-STAT VENOUS BLOOD GAS, ED - Abnormal; Notable for the following components:   pH, Ven 7.202 (*)    pCO2, Ven 77.7 (*)    pO2, Ven 19 (*)    Bicarbonate 30.8 (*)    TCO2 33 (*)    All other components within normal limits  TROPONIN I (HIGH SENSITIVITY) - Abnormal; Notable for the following components:   Troponin I (High Sensitivity) 23 (*)  All other components within normal limits  TROPONIN I (HIGH SENSITIVITY) - Abnormal; Notable for the following components:   Troponin I (High Sensitivity) 22 (*)    All other components within normal limits  CULTURE, BLOOD (ROUTINE X 2)  CULTURE, BLOOD (ROUTINE X 2)  LACTIC ACID, PLASMA  TACROLIMUS LEVEL  I-STAT VENOUS BLOOD GAS, ED    EKG EKG Interpretation  Date/Time:  Tuesday January 14 2023 12:05:25 EST Ventricular Rate:  93 PR Interval:    QRS Duration: 118 QT Interval:  384 QTC Calculation: 477 R Axis:   124 Text Interpretation: Atrial fibrillation with premature ventricular or aberrantly conducted complexes Right axis deviation Right ventricular hypertrophy Septal infarct , age undetermined ST & T wave abnormality, consider inferior ischemia Abnormal ECG New T wave inversions inferior leads When compared with ECG of 25-Aug-2022 06:50, PREVIOUS ECG IS PRESENT Confirmed by Georgina Snell [id number]) on 01/14/2023 12:30:56 PM  Radiology US Venous Img Lower Unilateral Right  Result Date: 01/14/2023 CLINICAL DATA:  Pain and swelling EXAM: Right LOWER EXTREMITY VENOUS DOPPLER ULTRASOUND TECHNIQUE: Gray-scale sonography with compression, as well as color and duplex ultrasound, were performed to evaluate the deep venous system(s) from the level of the common femoral vein through the popliteal and proximal calf veins. COMPARISON:  None Available. FINDINGS: VENOUS Normal compressibility of the common femoral, superficial femoral, and popliteal veins, as  well as the visualized calf veins. Visualized portions of profunda femoral vein and great saphenous vein unremarkable. No filling defects to suggest DVT on grayscale or color Doppler imaging. Doppler waveforms show normal direction of venous flow, normal respiratory plasticity and response to augmentation. Limited views of the contralateral common femoral vein are unremarkable. OTHER None. Limitations: none IMPRESSION: Negative. Electronically Signed   By: Donavan Foil M.D.   On: 01/14/2023 15:27   CT Chest Wo Contrast  Result Date: 01/14/2023 CLINICAL DATA:  Hypoxia and CHF EXAM: CT CHEST WITHOUT CONTRAST TECHNIQUE: Multidetector CT imaging of the chest was performed following the standard protocol without IV contrast. RADIATION DOSE REDUCTION: This exam was performed according to the departmental dose-optimization program which includes automated exposure control, adjustment of the mA and/or kV according to patient size and/or use of iterative reconstruction technique. COMPARISON:  Chest CT dated March 21, 2014 FINDINGS: Cardiovascular: Cardiomegaly. Pericardial effusion. Normal caliber thoracic aorta with moderate calcified plaque. Severe left main and three-vessel coronary artery calcifications. Aortic valve calcifications. Dilated main and distal pulmonary arteries, main pulmonary artery measures up to 4.0 cm. Mediastinum/Nodes: Esophagus and thyroid are unremarkable. No pathologically enlarged lymph nodes seen in the chest. Lungs/Pleura: Central airways are patent. Bibasilar linear opacities similar to prior exam and likely due to scarring. Small bilateral pleural effusions. Solid pulmonary nodule of the posterior left upper lobe measuring 5 mm on series 4, image 28, not definitely present on prior exam although differences in slice thickness limit evaluation Upper Abdomen: Gallstones. Small volume ascites of the partially visualized abdomen. Musculoskeletal: No chest wall mass or suspicious bone lesions  identified. IMPRESSION: 1. Small bilateral pleural effusions and small volume ascites of the partially visualized abdomen. 2. Dilated main and distal pulmonary arteries, findings can be seen in the setting of pulmonary hypertension. 3. Solid pulmonary nodule of the posterior left upper lobe measuring 5 mm. No follow-up needed if patient is low-risk.This recommendation follows the consensus statement: Guidelines for Management of Incidental Pulmonary Nodules Detected on CT Images: From the Fleischner Society 2017; Radiology 2017; 284:228-243. 4. Cardiomegaly and aortic Atherosclerosis (ICD10-I70.0). Electronically Signed  By: Yetta Glassman M.D.   On: 01/14/2023 13:35   DG Chest Portable 1 View  Result Date: 01/14/2023 CLINICAL DATA:  Hypoxia EXAM: PORTABLE CHEST 1 VIEW COMPARISON:  02/19/2021 FINDINGS: The heart size and mediastinal contours are within normal limits. Low lung volumes with bibasilar atelectasis. Blunting of the bilateral costophrenic angles for which trace pleural effusions are not excluded. Reticulonodular opacities bilaterally. No pneumothorax. The visualized skeletal structures are unremarkable. IMPRESSION: 1. Reticulonodular opacities bilaterally, which may reflect atypical/viral infection. 2. Low lung volumes with bibasilar atelectasis. Electronically Signed   By: Davina Poke D.O.   On: 01/14/2023 12:41    Procedures .Critical Care  Performed by: Elgie Congo, MD Authorized by: Elgie Congo, MD   Critical care provider statement:    Critical care time (minutes):  45   Critical care was necessary to treat or prevent imminent or life-threatening deterioration of the following conditions:  Respiratory failure and renal failure   Critical care was time spent personally by me on the following activities:  Development of treatment plan with patient or surrogate, discussions with consultants, evaluation of patient's response to treatment, examination of patient,  ordering and review of laboratory studies, ordering and review of radiographic studies, ordering and performing treatments and interventions, pulse oximetry, re-evaluation of patient's condition, review of old charts and obtaining history from patient or surrogate   Care discussed with: admitting provider       Medications Ordered in ED Medications  0.9 %  sodium chloride infusion (0 mLs Intravenous Stopped 01/14/23 1909)  cefTRIAXone (ROCEPHIN) 2 g in sodium chloride 0.9 % 100 mL IVPB (0 g Intravenous Stopped 01/14/23 1525)  azithromycin (ZITHROMAX) 500 mg in sodium chloride 0.9 % 250 mL IVPB (0 mg Intravenous Stopped 01/14/23 1908)  furosemide (LASIX) injection 80 mg (80 mg Intravenous Given 01/14/23 1544)    ED Course/ Medical Decision Making/ A&P Clinical Course as of 01/14/23 1932  Tue Jan 14, 2023  1503 S/w Dr Candiss Norse of nephrology recommending IV lasix 80 mg concern for renal transplant failure. [VB]  5366 S/w Adella Hare admitting hospitalist who will accept patient for admission to [facility] for further workup and management for hypoxic hypercarbic respiratory failure, AKI on CKD concern for rejection of kidney transplant, UTI, fluid overload on BiPAP. [VB]    Clinical Course User Index [VB] Elgie Congo, MD   {                            Medical Decision Making Russell Bowers is a 72 y.o. male.  With PMH of chronic diastolic heart failure, DM2, history of renal transplant on tacrolimus, aortic stenosis, OSA on BiPAP presenting with worsening shortness of breath and cough over the past 1 to 2 weeks.   Patient presents with hypoxic hypercarbic respiratory failure as initial VBG concerning for respiratory acidosis with pH 7.202 and pCO2 77.  He was started on BiPAP for these reasons however he was mentating appropriately.  Prior to being started on BiPAP he was requiring 4 L nasal cannula for hypoxia.  Considered presentation secondary to multiple underlying etiologies  including but not limited to fluid overload, pneumonia, viral URI, possible PE among multiple other etiologies.  Initial chest x-ray which I personally reviewed showed bilateral reticulonodular opacities concerning for atypical or viral infection so for this reason I started patient on Rocephin and azithromycin.  However, further evaluated with CT chest without contrast concerning for  bilateral pleural effusions and findings concerning for pulmonary hypertension.   Patient's BNP 1704, high-sensitivity troponin 23, new AKI on CKD concerning for renal transplant rejection creatinine now 5.37.  UA also consistent with UTI given antibiotics.  Additionally patient tested positive for COVID. Decadron 6 mg ordered for hypoxia with new covid infection.  Spoke with Dr. Candiss Norse of nephrology who recommended starting patient on IV diuresis with first dose IV Lasix 80 mg.  Discussed with Dr.Norins on-call hospitalist who will put in bed request for admission for further workup and management of hypoxic hypercarbic respiratory failure in the setting of AKI and concerns for renal transplant rejection, fluid overload, COVID infection.  Amount and/or Complexity of Data Reviewed Labs: ordered. Radiology: ordered.  Risk Prescription drug management. Decision regarding hospitalization.    Final Clinical Impression(s) / ED Diagnoses Final diagnoses:  Acute respiratory failure with hypoxia and hypercarbia ([place])  COVID-19  AKI (acute kidney injury) ([place])  Urinary tract infection without hematuria, site unspecified    Rx / DC Orders ED Discharge Orders     None         Elgie Congo, MD 01/14/23 [phone]

## 2023-01-14 NOTE — Progress Notes (Signed)
1550 RT removed the BIPAP and placed the Pt on 4L [place]. Pt stated that his stomach was nauseated. RT will continue to monitor

## 2023-01-14 NOTE — ED Notes (Signed)
Pt given baked chicken meal and water.

## 2023-01-14 NOTE — ED Notes (Signed)
[facility] has arrived

## 2023-01-14 NOTE — H&P (Incomplete)
Russell Bowers LTJ:[id number] DOB: 06/25/1951 DOA: 01/14/2023     PCP: [name], MD   Outpatient Specialists: * NONE CARDS: * Dr. NEphrology: Dr. Arman Filter at [facility]   Urology Dr. Lawerance Bach  Transplant at [facility]  Patient arrived to ER on 01/14/23 at 1143 Referred by Attending Norins, Heinz Knuckles, MD   Patient coming from:    home Lives With family   Chief Complaint:   Chief Complaint  Patient presents with  . Fatigue  . Cough    HPI: Russell Bowers is a 72 y.o. male with medical history significant of     DM2, OSA on BiPAP, diastolic CHF, sp Renal transplant due to hx of  ESRD secondary to IgA nephropathy s/p DDKT on 12/01/2011, recurent UTI, history of prostate cancer s/p radical prostatectomy in 2011.   Presented with   * Presented with cough and SOB for the past 1 wk  No fever no chills Uses BIpap for sleep at baseline  No hx of PE/DVT  Hxof diastolic CHF for which he is on lasix   His weight has been going up and he have had decreased urine output      Initial COVID TEST  NEGATIVE**** POSITIVE,  ***in house  PCR testing  Pending  Lab Results  Component Value Date   SARSCOV2NAA POSITIVE (A) 01/14/2023   [place] NEGATIVE 02/17/2021     Regarding pertinent Chronic problems: ***  Sp renal transplant Prograf monotherapy for immunosuppression.    Hyperlipidemia - on statins Pravachol (pravastatin)      HTN on toprol  ***chronic CHF diastolic/systolic/ combined - last echo***  *** CAD  - On Aspirin, statin, betablocker, Plavix                 - *followed by cardiology                - last cardiac cath  The 10-year ASCVD risk score (Arnett DK, et al., 2019) is: 34.5%   Values used to calculate the score:     Age: 34 years     Sex: Male     Is Non-Hispanic African American: No     Diabetic: Yes     Tobacco smoker: No     Systolic Blood Pressure: 076 mmHg     Is BP treated: Yes     HDL Cholesterol: 43 MG/DL      Total Cholesterol: 172 MG/DL     DM 2 -  Lab Results  Component Value Date   HGBA1C 7.4 (H) 02/18/2021   on insulin,  18- 20 units  ***Hypothyroidism:  Lab Results  Component Value Date   TSH 0.656 04/14/2013   on synthroid  *** Morbid obesity-   BMI Readings from Last 1 Encounters:  01/14/23 27.02 kg/m     *** Asthma -well *** controlled on home inhalers/ nebs f                        ***last no prior***admission  ***                       No ***history of intubation  *** COPD - not **followed by pulmonology *** not  on baseline oxygen  *L,    *** OSA -on nocturnal oxygen, *CPAP, *noncompliant with CPAP  *** Hx of CVA - *with/out residual deficits on Aspirin 81 mg, 325, Plavix  ***A. Fib -  -  CHA2DS2 vas score **** CHA2DS2/VAS Stroke Risk Points      N/A >= 2 Points: High Risk  1 - 1.99 Points: Medium Risk  0 Points: Low Risk    Last Change: N/A      This score determines the patient's risk of having a stroke if the  patient has atrial fibrillation.      This score is not applicable to this patient. Components are not  calculated.     current  on anticoagulation with ****Coumadin  ***Xarelto,* Eliquis,  *** Not on anticoagulation secondary to Risk of Falls, *** recurrent bleeding         -  Rate control:  Currently controlled with ***Toprolol,  *Metoprolol,* Diltiazem, *Coreg          - Rhythm control: *** amiodarone, *flecainide  ***Hx of DVT/PE on - anticoagulation with ****Coumadin  ***Xarelto,* Eliquis,   ***CKD stage III*- baseline Cr **** Estimated Creatinine Clearance: 12.6 mL/min (A) (by C-G formula based on SCr of 5.37 mg/dL (H)).  Lab Results  Component Value Date   CREATININE 5.37 (H) 01/14/2023   CREATININE 4.09 (H) 08/25/2022   CREATININE 3.21 (HH) 07/26/2021     **** Liver disease Computed MELD 3.0 unavailable. Necessary lab results were not found in the last year. Computed MELD-Na unavailable. Necessary lab results were not found in the  last year.    ***BPH - on Flomax, Proscar    *** Dementia - on Aricept** Nemenda  *** Chronic anemia - baseline hg Hemoglobin & Hematocrit  Recent Labs    08/25/22 0549 01/14/23 1249 01/14/23 1319  HGB 13.8 14.8 16.3     While in ER: Clinical Course as of 01/14/23 2334  Tue Jan 14, 2023  1503 S/w Dr Candiss Norse of nephrology recommending IV lasix 80 mg concern for renal transplant failure. [VB]  5397 S/w Adella Hare admitting hospitalist who will accept patient for admission to [facility] for further workup and management for hypoxic hypercarbic respiratory failure, AKI on CKD concern for rejection of kidney transplant, UTI, fluid overload on BiPAP. [VB]    Clinical Course User Index [VB] Elgie Congo, MD   Found to be COVID  positive CR 5.37 BNP 1700 Evidence of UTI  VBG    7.202 (*)        pCO2, Ven 77.7 (*)      pO2, Ven 19 (*)    Was given a dose of Rocephin azithromycin in ED of Lasix worrisome for renal transplant failure  Prior to being started on BiPAP he was requiring 4 L nasal cannula for hypoxia.  Decadron 6 mg ordered for hypoxia with new covid infection.   CXR - showed bilateral reticulonodular opacities concerning for atypical      CXR -  Reticulonodular opacities bilaterally, which may reflect atypical/viral infection.   CT  chest - Small bilateral pleural effusions and small volume ascites of the partially visualized abdomen. 2. Dilated main and distal pulmonary arteries, findings can be seen in the setting of pulmonary hypertension. 3. Solid pulmonary nodule of the posterior left upper lobe measuring 5 mm.   DVT study negative  Following Medications were ordered in ER: Medications  0.9 %  sodium chloride infusion (0 mLs Intravenous Stopped 01/14/23 1909)  mupirocin ointment (BACTROBAN) 2 % 1 Application (has no administration in time range)  Chlorhexidine Gluconate Cloth 2 % PADS 6 each (has no administration in time range)  Oral care  mouth rinse (has no administration in time range)  cefTRIAXone (ROCEPHIN) 2 g in sodium chloride 0.9 % 100 mL IVPB (0 g Intravenous Stopped 01/14/23 1525)  azithromycin (ZITHROMAX) 500 mg in sodium chloride 0.9 % 250 mL IVPB (0 mg Intravenous Stopped 01/14/23 1908)  furosemide (LASIX) injection 80 mg (80 mg Intravenous Given 01/14/23 1544)  dexamethasone (DECADRON) injection 6 mg (6 mg Intravenous Given 01/14/23 2047)    _______________________________________________________ ER Provider Called:     DrMarland Kitchen  They Recommend admit to medicine *** Will see in AM  ***SEEN in ER   ED Triage Vitals  Enc Vitals Group     BP 01/14/23 1140 125/87     Pulse Rate 01/14/23 1140 96     Resp 01/14/23 1140 (!) 26     Temp 01/14/23 1332 98.1 F (36.7 C)     Temp Source 01/14/23 1332 Oral     SpO2 01/14/23 1140 (!) 78 %     Weight 01/14/23 1221 183 lb (83 kg)     Height 01/14/23 1221 '5\' 9"'$  (1.753 m)     Head Circumference --      Peak Flow --      Pain Score 01/14/23 1221 0     Pain Loc --      Pain Edu? --      Excl. in [place]? --   TMAX(24)@     _________________________________________ Significant initial  Findings: Abnormal Labs Reviewed  RESP PANEL BY RT-PCR (RSV, FLU A&B, COVID)  RVPGX2 - Abnormal; Notable for the following components:      Result Value   SARS Coronavirus 2 by RT PCR POSITIVE (*)    All other components within normal limits  COMPREHENSIVE METABOLIC PANEL - Abnormal; Notable for the following components:   Glucose, Bld 192 (*)    BUN 81 (*)    Creatinine, Ser 5.37 (*)    AST 11 (*)    GFR, Estimated 11 (*)    All other components within normal limits  CBC WITH DIFFERENTIAL/PLATELET - Abnormal; Notable for the following components:   MCV 105.0 (*)    RDW 16.5 (*)    Platelets 138 (*)    Lymphs Abs 0.4 (*)    All other components within normal limits  BRAIN NATRIURETIC PEPTIDE - Abnormal; Notable for the following components:   B Natriuretic Peptide 1,704.0 (*)    All other  components within normal limits  URINALYSIS, ROUTINE W REFLEX MICROSCOPIC - Abnormal; Notable for the following components:   Glucose, UA 100 (*)    Hgb urine dipstick SMALL (*)    Protein, ur 100 (*)    Leukocytes,Ua LARGE (*)    WBC, UA >50 (*)    Bacteria, UA MANY (*)    All other components within normal limits  I-STAT VENOUS BLOOD GAS, ED - Abnormal; Notable for the following components:   pH, Ven 7.202 (*)    pCO2, Ven 77.7 (*)    pO2, Ven 19 (*)    Bicarbonate 30.8 (*)    TCO2 33 (*)    All other components within normal limits  CBG MONITORING, ED - Abnormal; Notable for the following components:   Glucose-Capillary 161 (*)    All other components within normal limits  TROPONIN I (HIGH SENSITIVITY) - Abnormal; Notable for the following components:   Troponin I (High Sensitivity) 23 (*)    All other components within normal limits  TROPONIN I (HIGH SENSITIVITY) - Abnormal; Notable for the following components:   Troponin I (High Sensitivity) 22 (*)  All other components within normal limits    _________________________ Troponin 22 - 23 ECG: Ordered Personally reviewed and interpreted by me showing: HR :  93 Rhythm:  Atrial fibrillation with premature ventricular or aberrantly conducted complexes Right axis deviation Right ventricular hypertrophy Septal infarct , age undetermined ST & T wave abnormality, consider inferior ischemia Abnormal ECG New T wave inversions inferior leads  QTC 477   ____________________ This patient meets SIRS Criteria and may be septic.    The recent clinical data is shown below. Vitals:   01/14/23 2100 01/14/23 2130 01/14/23 2135 01/14/23 2250  BP: 114/64 119/68    Pulse: (!) 108 81 80 93  Resp: 15 (!) 24 (!) 22 17  Temp: 97.6 F (36.4 C)     TempSrc: Oral     SpO2: 93% 91% 91%   Weight:      Height:        WBC     Component Value Date/Time   WBC 5.9 01/14/2023 1249   LYMPHSABS 0.4 (L) 01/14/2023 1249   MONOABS 0.4 01/14/2023  1249   EOSABS 0.1 01/14/2023 1249   BASOSABS 0.0 01/14/2023 1249     Lactic Acid, Venous    Component Value Date/Time   LATICACIDVEN 0.7 01/14/2023 1420    Procalcitonin *** Ordered Lactic Acid, Venous    Component Value Date/Time   LATICACIDVEN 0.7 01/14/2023 1420    Procalcitonin *** Ordered   UA *** no evidence of UTI  ***Pending ***not ordered   Urine analysis:    Component Value Date/Time   COLORURINE YELLOW 01/14/2023 [place] 01/14/2023 1420   LABSPEC 1.010 01/14/2023 1420   PHURINE 5.5 01/14/2023 1420   GLUCOSEU 100 (A) 01/14/2023 1420   HGBUR SMALL (A) 01/14/2023 1420   BILIRUBINUR NEGATIVE 01/14/2023 1420   KETONESUR NEGATIVE 01/14/2023 1420   PROTEINUR 100 (A) 01/14/2023 1420   UROBILINOGEN 0.2 08/10/2014 1844   NITRITE NEGATIVE 01/14/2023 1420   LEUKOCYTESUR LARGE (A) 01/14/2023 1420    Results for orders placed or performed during the hospital encounter of 01/14/23  Resp panel by RT-PCR (RSV, Flu A&B, Covid) Anterior Nasal Swab     Status: Abnormal   Collection Time: 01/14/23  1:33 PM   Specimen: Anterior Nasal Swab  Result Value Ref Range Status   SARS Coronavirus 2 by RT PCR POSITIVE (A) NEGATIVE Final    Comment: (NOTE) SARS-CoV-2 target nucleic acids are DETECTED.  The SARS-CoV-2 RNA is generally detectable in upper respiratory specimens during the acute phase of infection. Positive results are indicative of the presence of the identified virus, but do not rule out bacterial infection or co-infection with other pathogens not detected by the test. Clinical correlation with patient history and other diagnostic information is necessary to determine patient infection status. The expected result is Negative.  Fact Sheet for Patients: EntrepreneurPulse.com.au  Fact Sheet for Healthcare Providers: IncredibleEmployment.be  This test is not yet approved or cleared by the [place] FDA and  has  been authorized for detection and/or diagnosis of SARS-CoV-2 by FDA under an Emergency Use Authorization (EUA).  This EUA will remain in effect (meaning this test can be used) for the duration of  the COVID-19 declaration under Section 564(b)(1) of the A ct, 21 U.S.C. section 360bbb-3(b)(1), unless the authorization is terminated or revoked sooner.     Influenza A by PCR NEGATIVE NEGATIVE Final   Influenza B by PCR NEGATIVE NEGATIVE Final    Comment: (NOTE) The Xpert Xpress SARS-CoV-2/FLU/RSV  plus assay is intended as an aid in the diagnosis of influenza from Nasopharyngeal swab specimens and should not be used as a sole basis for treatment. Nasal washings and aspirates are unacceptable for Xpert Xpress SARS-CoV-2/FLU/RSV testing.  Fact Sheet for Patients: EntrepreneurPulse.com.au  Fact Sheet for Healthcare Providers: IncredibleEmployment.be  This test is not yet approved or cleared by the [place] FDA and has been authorized for detection and/or diagnosis of SARS-CoV-2 by FDA under an Emergency Use Authorization (EUA). This EUA will remain in effect (meaning this test can be used) for the duration of the COVID-19 declaration under Section 564(b)(1) of the Act, 21 U.S.C. section 360bbb-3(b)(1), unless the authorization is terminated or revoked.     Resp Syncytial Virus by PCR NEGATIVE NEGATIVE Final    Comment: (NOTE) Fact Sheet for Patients: EntrepreneurPulse.com.au  Fact Sheet for Healthcare Providers: IncredibleEmployment.be  This test is not yet approved or cleared by the [place] FDA and has been authorized for detection and/or diagnosis of SARS-CoV-2 by FDA under an Emergency Use Authorization (EUA). This EUA will remain in effect (meaning this test can be used) for the duration of the COVID-19 declaration under Section 564(b)(1) of the Act, 21 U.S.C. section 360bbb-3(b)(1), unless the  authorization is terminated or revoked.  Performed at [facility], [address] [place], [place] [address]      _______________________________________________ Hospitalist was called for admission for *** Acute respiratory failure with hypoxia and hypercarbia (HCC) ***  COVID-19 ***  AKI (acute kidney injury) ([place]) ***  Urinary tract infection without hematuria, site unspecified ***    The following Work up has been ordered so far:  Orders Placed This Encounter  Procedures  . Critical Care  . Resp panel by RT-PCR (RSV, Flu A&B, Covid) Anterior Nasal Swab  . Blood culture (routine x 2)  . DG Chest Portable 1 View  . CT Chest Wo Contrast  . US Venous Img Lower Unilateral Right  . Comprehensive metabolic panel  . CBC with Differential  . Brain natriuretic peptide  . Tacrolimus level  . Urinalysis, Routine w reflex microscopic  . Check temperature  . Patient may eat/drink  . Cardiac Monitoring - Continuous Indefinite  . Complete oral care assessment tool on admission, transfer, and q shift  . Refer to Sidebar Report Adult Oral Care Protocol  . Refer to Sidebar Report - CHG cloths Sidebar  . Patient education (specify): - [facility] Daily CHG Bathing  . Brush teeth with toothbrush and toothpaste 3 times daily  . Apply moisturizer in mouth and lips prn  . Consult to hospitalist  . Consult to nephrology  . Airborne and Contact precautions  . Bipap  . Bipap  . I-Stat venous blood gas, ([facility] ED, [facility], [facility])  . I-Stat venous blood gas, ([facility], [facility], [facility])  . CBG monitoring, ED  . EKG 12-Lead  . Admit to Inpatient (patient's expected length of stay will be greater than 2 midnights or inpatient only procedure)     OTHER Significant initial  Findings:  labs showing:    Recent Labs  Lab 01/14/23 1249 01/14/23 1319  NA 136 137  K 4.9 5.0  CO2 27  --   GLUCOSE 192*  --   BUN 81*  --   CREATININE 5.37*  --   CALCIUM 9.8  --     Cr  * stable,   Up from baseline see below Lab Results  Component Value Date   CREATININE 5.37 (H) 01/14/2023  CREATININE 4.09 (H) 08/25/2022   CREATININE 3.21 (HH) 07/26/2021    Recent Labs  Lab 01/14/23 1249  AST 11*  ALT 12  ALKPHOS 74  BILITOT 0.7  PROT 7.4  ALBUMIN 4.0   Lab Results  Component Value Date   CALCIUM 9.8 01/14/2023   PHOS 4.5 02/19/2021          Plt: Lab Results  Component Value Date   PLT 138 (L) 01/14/2023       COVID-19 Labs  No results for input(s): "DDIMER", "FERRITIN", "LDH", "CRP" in the last 72 hours.  Lab Results  Component Value Date   SARSCOV2NAA POSITIVE (A) 01/14/2023   [place] NEGATIVE 02/17/2021     Arterial ***Venous  Blood Gas result:  pH *** pCO2 ***; pO2 ***;     %O2 Sat ***.  ABG    Component Value Date/Time   PHART 7.295 (L) 03/20/2014 1829   PCO2ART 95.5 (HH) 03/20/2014 1829   PO2ART 67.0 (L) 03/20/2014 1829   HCO3 30.8 (H) 01/14/2023 1319   TCO2 33 (H) 01/14/2023 1319   O2SAT 21 01/14/2023 1319         Recent Labs  Lab 01/14/23 1249 01/14/23 1319  WBC 5.9  --   NEUTROABS 5.0  --   HGB 14.8 16.3  HCT 48.7 48.0  MCV 105.0*  --   PLT 138*  --     HG/HCT * stable,  Down *Up from baseline see below    Component Value Date/Time   HGB 16.3 01/14/2023 1319   HGB 11.8 (L) 07/26/2021 1324   HCT 48.0 01/14/2023 1319   MCV 105.0 (H) 01/14/2023 1249      No results for input(s): "LIPASE", "AMYLASE" in the last 168 hours. No results for input(s): "AMMONIA" in the last 168 hours.    Cardiac Panel (last 3 results) No results for input(s): "CKTOTAL", "CKMB", "TROPONINI", "RELINDX" in the last 72 hours.  .car BNP (last 3 results) Recent Labs    01/14/23 1249  BNP 1,704.0*      DM  labs:  HbA1C: No results for input(s): "HGBA1C" in the last 8760 hours.     CBG (last 3)  Recent Labs    01/14/23 2145  GLUCAP 161*          Cultures:    Component Value Date/Time   SDES URINE, CLEAN CATCH  08/25/2022 0655   SPECREQUEST  08/25/2022 0655    NONE Performed at [facility], [place] [address], [place], [place] [address]    CULT >=100,000 COLONIES/mL ESCHERICHIA COLI (A) 08/25/2022 0655   REPTSTATUS 08/27/2022 FINAL 08/25/2022 0655     Radiological Exams on Admission: US Venous Img Lower Unilateral Right  Result Date: 01/14/2023 CLINICAL DATA:  Pain and swelling EXAM: Right LOWER EXTREMITY VENOUS DOPPLER ULTRASOUND TECHNIQUE: Gray-scale sonography with compression, as well as color and duplex ultrasound, were performed to evaluate the deep venous system(s) from the level of the common femoral vein through the popliteal and proximal calf veins. COMPARISON:  None Available. FINDINGS: VENOUS Normal compressibility of the common femoral, superficial femoral, and popliteal veins, as well as the visualized calf veins. Visualized portions of profunda femoral vein and great saphenous vein unremarkable. No filling defects to suggest DVT on grayscale or color Doppler imaging. Doppler waveforms show normal direction of venous flow, normal respiratory plasticity and response to augmentation. Limited views of the contralateral common femoral vein are unremarkable. OTHER None. Limitations: none IMPRESSION: Negative. Electronically Signed   By: Maudie Mercury  Francoise Ceo M.D.   On: 01/14/2023 15:27   CT Chest Wo Contrast  Result Date: 01/14/2023 CLINICAL DATA:  Hypoxia and CHF EXAM: CT CHEST WITHOUT CONTRAST TECHNIQUE: Multidetector CT imaging of the chest was performed following the standard protocol without IV contrast. RADIATION DOSE REDUCTION: This exam was performed according to the departmental dose-optimization program which includes automated exposure control, adjustment of the mA and/or kV according to patient size and/or use of iterative reconstruction technique. COMPARISON:  Chest CT dated March 21, 2014 FINDINGS: Cardiovascular: Cardiomegaly. Pericardial effusion. Normal caliber thoracic aorta with  moderate calcified plaque. Severe left main and three-vessel coronary artery calcifications. Aortic valve calcifications. Dilated main and distal pulmonary arteries, main pulmonary artery measures up to 4.0 cm. Mediastinum/Nodes: Esophagus and thyroid are unremarkable. No pathologically enlarged lymph nodes seen in the chest. Lungs/Pleura: Central airways are patent. Bibasilar linear opacities similar to prior exam and likely due to scarring. Small bilateral pleural effusions. Solid pulmonary nodule of the posterior left upper lobe measuring 5 mm on series 4, image 28, not definitely present on prior exam although differences in slice thickness limit evaluation Upper Abdomen: Gallstones. Small volume ascites of the partially visualized abdomen. Musculoskeletal: No chest wall mass or suspicious bone lesions identified. IMPRESSION: 1. Small bilateral pleural effusions and small volume ascites of the partially visualized abdomen. 2. Dilated main and distal pulmonary arteries, findings can be seen in the setting of pulmonary hypertension. 3. Solid pulmonary nodule of the posterior left upper lobe measuring 5 mm. No follow-up needed if patient is low-risk.This recommendation follows the consensus statement: Guidelines for Management of Incidental Pulmonary Nodules Detected on CT Images: From the Fleischner Society 2017; Radiology 2017; 284:228-243. 4. Cardiomegaly and aortic Atherosclerosis (ICD10-I70.0). Electronically Signed   By: Yetta Glassman M.D.   On: 01/14/2023 13:35   DG Chest Portable 1 View  Result Date: 01/14/2023 CLINICAL DATA:  Hypoxia EXAM: PORTABLE CHEST 1 VIEW COMPARISON:  02/19/2021 FINDINGS: The heart size and mediastinal contours are within normal limits. Low lung volumes with bibasilar atelectasis. Blunting of the bilateral costophrenic angles for which trace pleural effusions are not excluded. Reticulonodular opacities bilaterally. No pneumothorax. The visualized skeletal structures are  unremarkable. IMPRESSION: 1. Reticulonodular opacities bilaterally, which may reflect atypical/viral infection. 2. Low lung volumes with bibasilar atelectasis. Electronically Signed   By: Davina Poke D.O.   On: 01/14/2023 12:41   _______________________________________________________________________________________________________ Latest  Blood pressure 119/68, pulse 93, temperature 97.6 F (36.4 C), temperature source Oral, resp. rate 17, height '5\' 9"'$  (1.753 m), weight 83 kg, SpO2 91 %.   Vitals  labs and radiology finding personally reviewed  Review of Systems:    Pertinent positives include: ***  Constitutional:  No weight loss, night sweats, Fevers, chills, fatigue, weight loss  HEENT:  No headaches, Difficulty swallowing,Tooth/dental problems,Sore throat,  No sneezing, itching, ear ache, nasal congestion, post nasal drip,  Cardio-vascular:  No chest pain, Orthopnea, PND, anasarca, dizziness, palpitations.no Bilateral lower extremity swelling  GI:  No heartburn, indigestion, abdominal pain, nausea, vomiting, diarrhea, change in bowel habits, loss of appetite, melena, blood in stool, hematemesis Resp:  no shortness of breath at rest. No dyspnea on exertion, No excess mucus, no productive cough, No non-productive cough, No coughing up of blood.No change in color of mucus.No wheezing. Skin:  no rash or lesions. No jaundice GU:  no dysuria, change in color of urine, no urgency or frequency. No straining to urinate.  No flank pain.  Musculoskeletal:  No joint pain or no joint swelling.  No decreased range of motion. No back pain.  Psych:  No change in mood or affect. No depression or anxiety. No memory loss.  Neuro: no localizing neurological complaints, no tingling, no weakness, no double vision, no gait abnormality, no slurred speech, no confusion  All systems reviewed and apart from [place] all are  negative _______________________________________________________________________________________________ Past Medical History:   Past Medical History:  Diagnosis Date  . ADHD (attention deficit hyperactivity disorder)   . Allergic rhinitis   . Cancer [facility])    Prostate cancer  . Cellulitis and abscess of trunk 2014   history of back abscess  . Cellulitis and abscess of unspecified site 2014   lower back  . Detached retina   . Diabetes mellitus without complication ([place])   . Elevated troponin   . Fistula    OF LEFT ARM  . Gout   . H/O kidney transplant   . History of congestive heart failure    FOLLOWED BY DR. Marlou Porch   . History of recurrent pneumonia   . 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 Melvyn Novas  . Hypertension   . Mild concentric left ventricular hypertrophy (LVH)    AND AORTIC STENOSIS FOLLOWED IN THE PAST BY CARDIOLOGIST DR. Marlou Porch.  . OSA treated with BiPAP   . Personal history of colonic polyps   . Prostate cancer ([place]) 11/2010   PROSTATECTOMY WITH UROLOGIST DR. DAVIS  . Renal failure   . Shortness of breath   . Sleep disorder breathing    FOLLOWED BY PULMONOLOGIST DR. Elsworth Soho      Past Surgical History:  Procedure Laterality Date  . CATARACT EXTRACTION, BILATERAL  11/2014  . COLONOSCOPY  09/2014  . KIDNEY TRANSPLANT  12/01/2011  . PROSTATECTOMY     FOR PROSTATE CANCER  . prostectomy  11/2010  . RETINAL DETACHMENT SURGERY  06/2002    Social History:  Ambulatory *** independently cane, walker  wheelchair bound, bed bound     reports that he quit smoking about 29 years ago. His smoking use included cigarettes. He has a 21.00 pack-year smoking history. He has never used smokeless tobacco. He reports current alcohol use of about 1.0 - 2.0 standard drink of alcohol per week. He reports that he does not use drugs.     Family History: *** Family History  Problem  Relation Age of Onset  . Diabetes Mother   . Hypertension Mother   . Atrial fibrillation Mother   . Alzheimer's disease Father   . Heart Problems Sister   . Heart disease Maternal Grandfather   . Heart disease Maternal Grandmother    ______________________________________________________________________________________________ Allergies: Allergies  Allergen Reactions  . Azathioprine Other (See Comments)  . Pollen Extract Other (See Comments)    stuffy nose     Prior to Admission medications   Medication Sig Start Date End Date Taking? Authorizing Provider  allopurinol (ZYLOPRIM) 100 MG tablet Take 100 mg by mouth daily. 07/30/22  Yes [provider]  calcitRIOL (ROCALTROL) 0.25 MCG capsule Take 0.25 mcg by mouth daily. 11/08/20  Yes [provider]  cinacalcet (SENSIPAR) 30 MG tablet Take 30 mg by mouth every other day.   Yes [provider]  docusate sodium (COLACE) 100 MG capsule Take 100 mg by mouth daily.   Yes [provider]  famotidine (PEPCID) 20 MG tablet Take 20 mg by mouth daily.   Yes [provider]  folic acid (FOLVITE) 1 MG tablet  TAKE 2 TABLETS BY MOUTH EVERY DAY 02/12/22  Yes Ennever, Rudell Cobb, MD  furosemide (LASIX) 40 MG tablet Take 1 tablet (40 mg total) by mouth daily. 02/22/21  Yes Johnson, Clanford L, MD  glipiZIDE (GLUCOTROL) 5 MG tablet Take 5 mg by mouth daily before breakfast. 3 per day, 2 qam and 1 pm daily   Yes [provider]  insulin glargine (LANTUS) 100 UNIT/ML injection Inject 0.1 mLs (10 Units total) into the skin at bedtime. Patient taking differently: Inject 18-20 Units into the skin at bedtime. 02/21/21  Yes Johnson, Clanford L, MD  loperamide (IMODIUM) 2 MG capsule Take 2 mg by mouth as needed for diarrhea or loose stools.   Yes [provider]  loratadine (CLARITIN) 10 MG tablet Take 10 mg by mouth daily.   Yes [provider]  Magnesium Cl-Calcium Carbonate (SLOW-MAG PO) Take 1  tablet by mouth daily.    Yes [provider]  metoprolol succinate (TOPROL-XL) 50 MG 24 hr tablet Take 50 mg by mouth daily.   Yes [provider]  pravastatin (PRAVACHOL) 10 MG tablet Take 10 mg by mouth at bedtime.    Yes [provider]  tacrolimus (PROGRAF) 0.5 MG capsule Take 1 mg by mouth See admin instructions. 2 qam and 3 qpm   Yes [provider]  vitamin B-12 (CYANOCOBALAMIN) 500 MCG tablet Take 1,000 mcg by mouth at bedtime.   Yes [provider]  B-D ULTRAFINE III SHORT PEN 31G X 8 MM MISC Inject into the skin daily. 04/11/21   [provider]  glipiZIDE (GLUCOTROL XL) 5 MG 24 hr tablet Take 1 tablet (5 mg total) by mouth daily with breakfast. Patient not taking: Reported on 04/25/2022 02/21/21   Murlean Iba, MD  [facility] ULTRA test strip 3 (three) times daily. 02/26/21   [provider]    ___________________________________________________________________________________________________ Physical Exam:    01/14/2023   10:50 PM 01/14/2023    9:35 PM 01/14/2023    9:30 PM  Vitals with BMI  Systolic   409  Diastolic   68  Pulse 93 80 81     1. General:  in No ***Acute distress***increased work of breathing ***complaining of severe pain****agitated * Chronically ill *well *cachectic *toxic acutely ill -appearing 2. Psychological: Alert and *** Oriented 3. Head/ENT:   Moist *** Dry Mucous Membranes                          Head Non traumatic, neck supple                          Normal *** Poor Dentition 4. SKIN: normal *** decreased Skin turgor,  Skin clean Dry and intact no rash 5. Heart: Regular rate and rhythm no*** Murmur, no Rub or gallop 6. Lungs: ***Clear to auscultation bilaterally, no wheezes or crackles   7. Abdomen: Soft, ***non-tender, Non distended *** obese ***bowel sounds present 8. Lower extremities: no clubbing, cyanosis, no ***edema 9. Neurologically Grossly intact, moving all 4 extremities  equally *** strength 5 out of 5 in all 4 extremities cranial nerves II through XII intact 10. MSK: Normal range of motion    Chart has been reviewed  ______________________________________________________________________________________________  Assessment/Plan  ***  Admitted for *** Acute respiratory failure with hypoxia and hypercarbia (HCC) ***  COVID-19 ***  AKI (acute kidney injury) ([place]) ***  Urinary tract infection without hematuria, site unspecified ***  Present on Admission: . Acute on chronic heart failure with preserved ejection fraction (HFpEF) ([place])     No problem-specific Assessment & Plan notes found for this encounter.    Other plan as per orders.  DVT prophylaxis:  SCD *** Lovenox       Code Status:    Code Status: Prior FULL CODE *** DNR/DNI ***comfort care as per patient ***family  I had personally discussed CODE STATUS with patient and family* I had spent *min discussing goals of care and CODE STATUS    Family Communication:   Family not at  Bedside  plan of care was discussed on the phone with *** Son, Daughter, Wife, Husband, Sister, Brother , father, mother  Disposition Plan:   *** likely will need placement for rehabilitation                          Back to current facility when stable                            To home once workup is complete and patient is stable  ***Following barriers for discharge:                            Electrolytes corrected                               Anemia corrected                             Pain controlled with PO medications                               Afebrile, white count improving able to transition to PO antibiotics                             Will need to be able to tolerate PO                            Will likely need home health, home O2, set up                           Will need consultants to evaluate patient prior to discharge  ****EXPECT DC tomorrow                    ***Would  benefit from PT/OT eval prior to DC  Ordered                   Swallow eval - SLP ordered                   Diabetes care coordinator                   Transition of care consulted                   Nutrition    consulted                  Wound care  consulted  Palliative care    consulted                   Behavioral health  consulted                    Consults called: ***    Admission status:  ED Disposition     ED Disposition  Admit   Condition  --   [place]: [place] [[id number]]  Level of Care: Stepdown [14]  Admit to SDU based on following criteria: Respiratory Distress:  Frequent assessment and/or intervention to maintain adequate ventilation/respiration, pulmonary toilet, and respiratory treatment.  May [facility] to [facility] or [facility] if equivalent level of care is available:: Yes  Interfacility transfer: Yes  Covid Evaluation: Confirmed COVID Positive  Diagnosis: Acute on chronic heart failure with preserved ejection fraction (HFpEF) ([place]) [[id number]]  Admitting Physician: Neena Rhymes [[id number]]  Attending Physician: Neena Rhymes [[id number]]  Certification:: I certify this patient will need inpatient services for at least 2 midnights  Estimated Length of Stay: 3           Obs***  ***  inpatient     I Expect 2 midnight stay secondary to severity of patient's current illness need for inpatient interventions justified by the following: ***hemodynamic instability despite optimal treatment (tachycardia *hypotension * tachypnea *hypoxia, hypercapnia) * Severe lab/radiological/exam abnormalities including:     and extensive comorbidities including: *substance abuse  *Chronic pain *DM2  * CHF * CAD  * COPD/asthma *Morbid Obesity * CKD *dementia *liver disease *history of stroke with residual deficits *  malignancy, * sickle cell disease  History of amputation Chronic anticoagulation  That are  currently affecting medical management.   I expect  patient to be hospitalized for 2 midnights requiring inpatient medical care.  Patient is at high risk for adverse outcome (such as loss of life or disability) if not treated.  Indication for inpatient stay as follows:  Severe change from baseline regarding mental status Hemodynamic instability despite maximal medical therapy,  ongoing suicidal ideations,  severe pain requiring acute inpatient management,  inability to maintain oral hydration   persistent chest pain despite medical management Need for operative/procedural  intervention New or worsening hypoxia   Need for IV antibiotics, IV fluids, IV rate controling medications, IV antihypertensives, IV pain medications, IV anticoagulation, need for biPAP    Level of care   *** tele  For 12H 24H     medical floor       progressive tele indefinitely please discontinue once patient no longer qualifies COVID-19 Labs    Lab Results  Component Value Date   SARSCOV2NAA POSITIVE (A) 01/14/2023     Precautions: admitted as *** Covid Negative  ***asymptomatic screening protocol****PUI *** covid positive Airborne and Contact precautions ***If Covid PCR is negative  - please DC precautions - would need additional investigation given very high risk for false native test result    Critical***  Patient is critically ill due to  hemodynamic instability * respiratory failure *severe sepsis* ongoing chest pain*  They are at high risk for life/limb threatening clinical deterioration requiring frequent reassessment and modifications of care.  Services provided include examination of the patient, review of relevant ancillary tests, prescription of lifesaving therapies, review of medications and prophylactic therapy.  Total critical care time excluding separately billable procedures: 60*  Minutes.    Toy Baker 01/14/2023, 11:34 PM ***  [facility]  after 2 AM please page  floor coverage PA If 7AM-7PM, please contact the day team taking care of the patient using [email or web address]   Patient was evaluated in the context of the global COVID-19 pandemic, which necessitated consideration that the patient might be at risk for infection with the SARS-CoV-2 virus that causes COVID-19. Institutional protocols and algorithms that pertain to the evaluation of patients at risk for COVID-19 are in a state of rapid change based on information released by regulatory bodies including the CDC and federal and state organizations. These policies and algorithms were followed during the patient's care.

## 2023-01-14 NOTE — H&P (Addendum)
Russell Bowers QBH:[id number] DOB: 1951/07/03 DOA: 01/14/2023     PCP: [name], MD   [facility]  NEphrology: Dr. Arman Filter at [facility]   Urology Dr. Lawerance Bach  Transplant at [facility]  He has appointment with them next week  Patient arrived to ER on 01/14/23 at 1143 Referred by Attending Norins, Heinz Knuckles, MD  Patient coming from:    home Lives With family  Chief Complaint:   Chief Complaint  Patient presents with   Fatigue   Cough    HPI: Russell Bowers is a 72 y.o. male with medical history significant of     DM2, OSA on BiPAP, diastolic CHF, sp Renal transplant due to hx of  ESRD secondary to IgA nephropathy s/p DDKT on 12/01/2011, recurent UTI, history of prostate cancer s/p radical prostatectomy in 2011.   Presented with   Shortness of breath Presented with cough and SOB for the past 1 wk  No fever no chills Uses BIpap for sleep at baseline  No hx of PE/DVT  Hxof diastolic CHF for which he is on lasix   His weight has been going up and he have had decreased urine output He is supposed to be seen by [facility] next wk Bc of concerned that his kidney transplant is failing and he is supposed to gt back on the list  No problem of emptying his bladder  He has been having cough for at least 2 wks We was tested for COVID 2 wks ago on Jan 7th and it was negative  Does not smoke or drink Not on Oxygen at baseline     Initial COVID TEST   POSITIVE,    Lab Results  Component Value Date   [place] (A) 01/14/2023   [place] NEGATIVE 02/17/2021     Regarding pertinent Chronic problems:    Sp renal transplant Prograf monotherapy for immunosuppression.  His creatine started to go up   Hyperlipidemia - on statins Pravachol (pravastatin)      HTN on toprol   chronic CHF diastolic  - last echo 0973 preserved EF     DM 2 -  Lab Results  Component Value Date   HGBA1C 7.4 (H) 02/18/2021   on insulin,  18- 20  units   OSA -on nocturnal bipap   CKD stage iv- baseline Cr 4 Estimated Creatinine Clearance: 12.6 mL/min (A) (by C-G formula based on SCr of 5.37 mg/dL (H)).  Lab Results  Component Value Date   CREATININE 5.37 (H) 01/14/2023   CREATININE 4.09 (H) 08/25/2022   CREATININE 3.21 ([place]) 07/26/2021   While in ER: Clinical Course as of 01/14/23 2334  Tue Jan 14, 2023  1503 S/w Dr Candiss Norse of nephrology recommending IV lasix 80 mg concern for renal transplant failure. [VB]  5329 S/w Adella Hare admitting hospitalist who will accept patient for admission to [facility] for further workup and management for hypoxic hypercarbic respiratory failure, AKI on CKD concern for rejection of kidney transplant, UTI, fluid overload on BiPAP. [VB]    Clinical Course User Index [VB] Elgie Congo, MD   Found to be COVID  positive CR 5.37 BNP 1700 Evidence of UTI  VBG    7.202 (*)        pCO2, Ven 77.7 (*)      pO2, Ven 19 (*)    Was given a dose of Rocephin azithromycin in ED of Lasix worrisome for renal transplant failure  Prior to being  started on BiPAP he was requiring 4 L nasal cannula for hypoxia.  Decadron 6 mg ordered for hypoxia with new covid infection.   CXR - showed bilateral reticulonodular opacities concerning for atypical      CXR -  Reticulonodular opacities bilaterally, which may reflect atypical/viral infection.   CT  chest - Small bilateral pleural effusions and small volume ascites of the partially visualized abdomen. 2. Dilated main and distal pulmonary arteries, findings can be seen in the setting of pulmonary hypertension. 3. Solid pulmonary nodule of the posterior left upper lobe measuring 5 mm.   DVT study negative  Following Medications were ordered in ER: Medications  0.9 %  sodium chloride infusion (0 mLs Intravenous Stopped 01/14/23 1909)  mupirocin ointment (BACTROBAN) 2 % 1 Application (has no administration in time range)  Chlorhexidine Gluconate  Cloth 2 % PADS 6 each (has no administration in time range)  Oral care mouth rinse (has no administration in time range)  cefTRIAXone (ROCEPHIN) 2 g in sodium chloride 0.9 % 100 mL IVPB (0 g Intravenous Stopped 01/14/23 1525)  azithromycin (ZITHROMAX) 500 mg in sodium chloride 0.9 % 250 mL IVPB (0 mg Intravenous Stopped 01/14/23 1908)  furosemide (LASIX) injection 80 mg (80 mg Intravenous Given 01/14/23 1544)  dexamethasone (DECADRON) injection 6 mg (6 mg Intravenous Given 01/14/23 2047)    _______________________________________________________ ER Provider Called:  nephrology   Dr. Candiss Norse  They Recommend admit to medicine  Will see in AM     ED Triage Vitals  Enc Vitals Group     BP 01/14/23 1140 125/87     Pulse Rate 01/14/23 1140 96     Resp 01/14/23 1140 (!) 26     Temp 01/14/23 1332 98.1 F (36.7 C)     Temp Source 01/14/23 1332 Oral     SpO2 01/14/23 1140 (!) 78 %     Weight 01/14/23 1221 183 lb (83 kg)     Height 01/14/23 1221 '5\' 9"'$  (1.753 m)     Head Circumference --      Peak Flow --      Pain Score 01/14/23 1221 0     Pain Loc --      Pain Edu? --      Excl. in [place]? --   TMAX(24)@     _________________________________________ Significant initial  Findings: Abnormal Labs Reviewed  RESP PANEL BY RT-PCR (RSV, FLU A&B, COVID)  RVPGX2 - Abnormal; Notable for the following components:      Result Value   SARS Coronavirus 2 by RT PCR POSITIVE (*)    All other components within normal limits  COMPREHENSIVE METABOLIC PANEL - Abnormal; Notable for the following components:   Glucose, Bld 192 (*)    BUN 81 (*)    Creatinine, Ser 5.37 (*)    AST 11 (*)    GFR, Estimated 11 (*)    All other components within normal limits  CBC WITH DIFFERENTIAL/PLATELET - Abnormal; Notable for the following components:   MCV 105.0 (*)    RDW 16.5 (*)    Platelets 138 (*)    Lymphs Abs 0.4 (*)    All other components within normal limits  BRAIN NATRIURETIC PEPTIDE - Abnormal; Notable for the  following components:   B Natriuretic Peptide 1,704.0 (*)    All other components within normal limits  URINALYSIS, ROUTINE W REFLEX MICROSCOPIC - Abnormal; Notable for the following components:   Glucose, UA 100 (*)    Hgb urine dipstick SMALL (*)  Protein, ur 100 (*)    Leukocytes,Ua LARGE (*)    WBC, UA >50 (*)    Bacteria, UA MANY (*)    All other components within normal limits  I-STAT VENOUS BLOOD GAS, ED - Abnormal; Notable for the following components:   pH, Ven 7.202 (*)    pCO2, Ven 77.7 (*)    pO2, Ven 19 (*)    Bicarbonate 30.8 (*)    TCO2 33 (*)    All other components within normal limits  CBG MONITORING, ED - Abnormal; Notable for the following components:   Glucose-Capillary 161 (*)    All other components within normal limits  TROPONIN I (HIGH SENSITIVITY) - Abnormal; Notable for the following components:   Troponin I (High Sensitivity) 23 (*)    All other components within normal limits  TROPONIN I (HIGH SENSITIVITY) - Abnormal; Notable for the following components:   Troponin I (High Sensitivity) 22 (*)    All other components within normal limits    _________________________ Troponin 22 - 23 ECG: Ordered Personally reviewed and interpreted by me showing: HR :  93 Rhythm:  Atrial fibrillation with premature ventricular or aberrantly conducted complexes Right axis deviation Right ventricular hypertrophy Septal infarct , age undetermined ST & T wave abnormality, consider inferior ischemia Abnormal ECG New T wave inversions inferior leads  QTC 477   ____________________ This patient meets SIRS Criteria and may be septic.    The recent clinical data is shown below. Vitals:   01/14/23 2100 01/14/23 2130 01/14/23 2135 01/14/23 2250  BP: 114/64 119/68    Pulse: (!) 108 81 80 93  Resp: 15 (!) 24 (!) 22 17  Temp: 97.6 F (36.4 C)     TempSrc: Oral     SpO2: 93% 91% 91%   Weight:      Height:        WBC     Component Value Date/Time   WBC 5.9  01/14/2023 1249   LYMPHSABS 0.4 (L) 01/14/2023 1249   MONOABS 0.4 01/14/2023 1249   EOSABS 0.1 01/14/2023 1249   BASOSABS 0.0 01/14/2023 1249     Lactic Acid, Venous    Component Value Date/Time   LATICACIDVEN 0.7 01/14/2023 1420    Procalcitonin   Ordered Lactic Acid, Venous    Component Value Date/Time   LATICACIDVEN 0.7 01/14/2023 1420    Procalcitonin   Ordered   UA   evidence of UTI    Urine analysis:    Component Value Date/Time   COLORURINE YELLOW 01/14/2023 1420   APPEARANCEUR CLEAR 01/14/2023 1420   LABSPEC 1.010 01/14/2023 1420   PHURINE 5.5 01/14/2023 1420   GLUCOSEU 100 (A) 01/14/2023 1420   HGBUR SMALL (A) 01/14/2023 1420   BILIRUBINUR NEGATIVE 01/14/2023 1420   KETONESUR NEGATIVE 01/14/2023 1420   PROTEINUR 100 (A) 01/14/2023 1420   UROBILINOGEN 0.2 08/10/2014 1844   NITRITE NEGATIVE 01/14/2023 1420   LEUKOCYTESUR LARGE (A) 01/14/2023 1420    Results for orders placed or performed during the hospital encounter of 01/14/23  Resp panel by RT-PCR (RSV, Flu A&B, Covid) Anterior Nasal Swab     Status: Abnormal   Collection Time: 01/14/23  1:33 PM   Specimen: Anterior Nasal Swab  Result Value Ref Range Status   SARS Coronavirus 2 by RT PCR POSITIVE (A) NEGATIVE Final        Influenza A by PCR NEGATIVE NEGATIVE Final   Influenza B by PCR NEGATIVE NEGATIVE Final  Resp Syncytial Virus by PCR NEGATIVE NEGATIVE Final        _______________________________________________ Hospitalist was called for admission for  Acute respiratory failure with hypoxia and hypercarbia COVID-19 AKI (acute kidney injury) ([place]) on CKD due to renal transplant failure Urinary tract infection without hematuria   The following Work up has been ordered so far:  Orders Placed This Encounter  Procedures   Critical Care   Resp panel by RT-PCR (RSV, Flu A&B, Covid) Anterior Nasal Swab   Blood culture (routine x 2)   DG Chest Portable 1 View   CT Chest Wo Contrast   US  Venous Img Lower Unilateral Right   Comprehensive metabolic panel   CBC with Differential   Brain natriuretic peptide   Tacrolimus level   Urinalysis, Routine w reflex microscopic   Check temperature   Patient may eat/drink   Cardiac Monitoring - Continuous Indefinite   Complete oral care assessment tool on admission, transfer, and q shift   Refer to Sidebar Report Adult Oral Care Protocol   Refer to Sidebar Report - CHG cloths Sidebar   Patient education (specify): - [facility] Daily CHG Bathing   Brush teeth with toothbrush and toothpaste 3 times daily   Apply moisturizer in mouth and lips prn   Consult to hospitalist   Consult to nephrology   Airborne and Contact precautions   Bipap   Bipap   I-Stat venous blood gas, ([place] [facility], [facility], [facility])   I-Stat venous blood gas, ([place] [facility], [facility], [facility])   CBG monitoring, ED   EKG 12-Lead   Admit to Inpatient (patient's expected length of stay will be greater than 2 midnights or inpatient only procedure)     OTHER Significant initial  Findings:  labs showing:    Recent Labs  Lab 01/14/23 1249 01/14/23 1319  NA 136 137  K 4.9 5.0  CO2 27  --   GLUCOSE 192*  --   BUN 81*  --   CREATININE 5.37*  --   CALCIUM 9.8  --     Cr  Up from baseline see below Lab Results  Component Value Date   CREATININE 5.37 (H) 01/14/2023   CREATININE 4.09 (H) 08/25/2022   CREATININE 3.21 (HH) 07/26/2021    Recent Labs  Lab 01/14/23 1249  AST 11*  ALT 12  ALKPHOS 74  BILITOT 0.7  PROT 7.4  ALBUMIN 4.0   Lab Results  Component Value Date   CALCIUM 9.8 01/14/2023   PHOS 4.5 02/19/2021     Plt: Lab Results  Component Value Date   PLT 138 (L) 01/14/2023    COVID-19 Labs  No results for input(s): "DDIMER", "FERRITIN", "LDH", "CRP" in the last 72 hours.  Lab Results  Component Value Date   SARSCOV2NAA POSITIVE (A) 01/14/2023   [place] NEGATIVE 02/17/2021    Venous  Blood Gas result:  pH   Latest Reference Range & Units 01/14/23 13:19  pH,  Ven 7.25 - 7.43  7.202 (L)  pCO2, Ven 44 - 60 mmHg 77.7 (HH)  pO2, Ven 32 - 45 mmHg 19 (LL)  (HH): Data is critically high (LL): Data is critically low (L): Data is abnormally low   Repeat   7.14 Low Panic   Acid-base deficit 3.7 High  mmol/L   pCO2, Ven 82 High Panic  mmHg  O2 Saturation 52.6 %  pO2, Ven <31 Low Panic  mmH      BiPap was changed  Pt is alert and oriented Recent Labs  Lab 01/14/23 1249 01/14/23 1319  WBC 5.9  --   NEUTROABS 5.0  --   HGB 14.8 16.3  HCT 48.7 48.0  MCV 105.0*  --   PLT 138*  --     HG/HCT stable,      Component Value Date/Time   HGB 16.3 01/14/2023 1319   HGB 11.8 (L) 07/26/2021 1324   HCT 48.0 01/14/2023 1319   MCV 105.0 (H) 01/14/2023 1249     .car BNP (last 3 results) Recent Labs    01/14/23 1249  BNP 1,704.0*   DM  labs:  HbA1C: No results for input(s): "HGBA1C" in the last 8760 hours.     CBG (last 3)  Recent Labs    01/14/23 2145  GLUCAP 161*  Cultures:    Component Value Date/Time   SDES URINE, CLEAN CATCH 08/25/2022 0655   SPECREQUEST  08/25/2022 0655    NONE Performed at [facility], [place] [address], [place], [place] [address]    CULT >=100,000 COLONIES/mL ESCHERICHIA COLI (A) 08/25/2022 0655   REPTSTATUS 08/27/2022 FINAL 08/25/2022 0655     Radiological Exams on Admission: US Venous Img Lower Unilateral Right  Result Date: 01/14/2023 CLINICAL DATA:  Pain and swelling EXAM: Right LOWER EXTREMITY VENOUS DOPPLER ULTRASOUND TECHNIQUE: Gray-scale sonography with compression, as well as color and duplex ultrasound, were performed to evaluate the deep venous system(s) from the level of the common femoral vein through the popliteal and proximal calf veins. COMPARISON:  None Available. FINDINGS: VENOUS Normal compressibility of the common femoral, superficial femoral, and popliteal veins, as well as the visualized calf veins. Visualized portions of profunda femoral vein and great saphenous vein unremarkable. No  filling defects to suggest DVT on grayscale or color Doppler imaging. Doppler waveforms show normal direction of venous flow, normal respiratory plasticity and response to augmentation. Limited views of the contralateral common femoral vein are unremarkable. OTHER None. Limitations: none IMPRESSION: Negative. Electronically Signed   By: Donavan Foil M.D.   On: 01/14/2023 15:27   CT Chest Wo Contrast  Result Date: 01/14/2023 CLINICAL DATA:  Hypoxia and CHF EXAM: CT CHEST WITHOUT CONTRAST TECHNIQUE: Multidetector CT imaging of the chest was performed following the standard protocol without IV contrast. RADIATION DOSE REDUCTION: This exam was performed according to the departmental dose-optimization program which includes automated exposure control, adjustment of the mA and/or kV according to patient size and/or use of iterative reconstruction technique. COMPARISON:  Chest CT dated March 21, 2014 FINDINGS: Cardiovascular: Cardiomegaly. Pericardial effusion. Normal caliber thoracic aorta with moderate calcified plaque. Severe left main and three-vessel coronary artery calcifications. Aortic valve calcifications. Dilated main and distal pulmonary arteries, main pulmonary artery measures up to 4.0 cm. Mediastinum/Nodes: Esophagus and thyroid are unremarkable. No pathologically enlarged lymph nodes seen in the chest. Lungs/Pleura: Central airways are patent. Bibasilar linear opacities similar to prior exam and likely due to scarring. Small bilateral pleural effusions. Solid pulmonary nodule of the posterior left upper lobe measuring 5 mm on series 4, image 28, not definitely present on prior exam although differences in slice thickness limit evaluation Upper Abdomen: Gallstones. Small volume ascites of the partially visualized abdomen. Musculoskeletal: No chest wall mass or suspicious bone lesions identified. IMPRESSION: 1. Small bilateral pleural effusions and small volume ascites of the partially visualized abdomen.  2. Dilated main and distal pulmonary arteries, findings can be seen in the setting of pulmonary hypertension. 3. Solid pulmonary nodule of the posterior left upper lobe measuring 5 mm. No follow-up needed if  patient is low-risk.This recommendation follows the consensus statement: Guidelines for Management of Incidental Pulmonary Nodules Detected on CT Images: From the Fleischner Society 2017; Radiology 2017; 284:228-243. 4. Cardiomegaly and aortic Atherosclerosis (ICD10-I70.0). Electronically Signed   By: Yetta Glassman M.D.   On: 01/14/2023 13:35   DG Chest Portable 1 View  Result Date: 01/14/2023 CLINICAL DATA:  Hypoxia EXAM: PORTABLE CHEST 1 VIEW COMPARISON:  02/19/2021 FINDINGS: The heart size and mediastinal contours are within normal limits. Low lung volumes with bibasilar atelectasis. Blunting of the bilateral costophrenic angles for which trace pleural effusions are not excluded. Reticulonodular opacities bilaterally. No pneumothorax. The visualized skeletal structures are unremarkable. IMPRESSION: 1. Reticulonodular opacities bilaterally, which may reflect atypical/viral infection. 2. Low lung volumes with bibasilar atelectasis. Electronically Signed   By: Davina Poke D.O.   On: 01/14/2023 12:41   _______________________________________________________________________________________________________ Latest  Blood pressure 119/68, pulse 93, temperature 97.6 F (36.4 C), temperature source Oral, resp. rate 17, height '5\' 9"'$  (1.753 m), weight 83 kg, SpO2 91 %.   Vitals  labs and radiology finding personally reviewed  Review of Systems:    Pertinent positives include:  non-productive cough fatigue, shortness of breath at rest  dyspnea on exertion, Constitutional:  No weight loss, night sweats, Fevers, chills,  weight loss  HEENT:  No headaches, Difficulty swallowing,Tooth/dental problems,Sore throat,  No sneezing, itching, ear ache, nasal congestion, post nasal drip,  Cardio-vascular:   No chest pain, Orthopnea, PND, anasarca, dizziness, palpitations.no Bilateral lower extremity swelling  GI:  No heartburn, indigestion, abdominal pain, nausea, vomiting, diarrhea, change in bowel habits, loss of appetite, melena, blood in stool, hematemesis Resp:  no No excess mucus, no productive cough, No, No coughing up of blood.No change in color of mucus.No wheezing. Skin:  no rash or lesions. No jaundice GU:  no dysuria, change in color of urine, no urgency or frequency. No straining to urinate.  No flank pain.  Musculoskeletal:  No joint pain or no joint swelling. No decreased range of motion. No back pain.  Psych:  No change in mood or affect. No depression or anxiety. No memory loss.  Neuro: no localizing neurological complaints, no tingling, no weakness, no double vision, no gait abnormality, no slurred speech, no confusion  All systems reviewed and apart from [place] all are negative _______________________________________________________________________________________________ Past Medical History:   Past Medical History:  Diagnosis Date   ADHD (attention deficit hyperactivity disorder)    Allergic rhinitis    Cancer ([place])    Prostate cancer   Cellulitis and abscess of trunk 2014   history of back abscess   Cellulitis and abscess of unspecified site 2014   lower back   Detached retina    Diabetes mellitus without complication (HCC)    Elevated troponin    Fistula    OF LEFT ARM   Gout    H/O kidney transplant    History of congestive heart failure    FOLLOWED BY DR. 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 Melvyn Novas   Hypertension    Mild concentric left ventricular hypertrophy (LVH)    AND AORTIC STENOSIS FOLLOWED IN THE PAST BY CARDIOLOGIST DR. Marlou Porch.   OSA treated with BiPAP    Personal history of colonic polyps    Prostate  cancer ([place]) 11/2010   PROSTATECTOMY WITH UROLOGIST DR. DAVIS   Renal failure    Shortness of breath  Sleep disorder breathing    FOLLOWED BY PULMONOLOGIST DR. Elsworth Soho   Past Surgical History:  Procedure Laterality Date   CATARACT EXTRACTION, BILATERAL  11/2014   COLONOSCOPY  09/2014   KIDNEY TRANSPLANT  12/01/2011   PROSTATECTOMY     FOR PROSTATE CANCER   prostectomy  11/2010   RETINAL DETACHMENT SURGERY  06/2002    Social History:  Ambulatory  independently    reports that he quit smoking about 29 years ago. His smoking use included cigarettes. He has a 21.00 pack-year smoking history. He has never used smokeless tobacco. He reports current alcohol use of about 1.0 - 2.0 standard drink of alcohol per week. He reports that he does not use drugs.     Family History:  Family History  Problem Relation Age of Onset   Diabetes Mother    Hypertension Mother    Atrial fibrillation Mother    Alzheimer's disease Father    Heart Problems Sister    Heart disease Maternal Grandfather    Heart disease Maternal Grandmother    ______________________________________________________________________________________________ Allergies: Allergies  Allergen Reactions   Azathioprine Other (See Comments)   Pollen Extract Other (See Comments)    stuffy nose     Prior to Admission medications   Medication Sig Start Date End Date Taking? Authorizing Provider  allopurinol (ZYLOPRIM) 100 MG tablet Take 100 mg by mouth daily. 07/30/22  Yes [provider]  calcitRIOL (ROCALTROL) 0.25 MCG capsule Take 0.25 mcg by mouth daily. 11/08/20  Yes [provider]  cinacalcet (SENSIPAR) 30 MG tablet Take 30 mg by mouth every other day.   Yes [provider]  docusate sodium (COLACE) 100 MG capsule Take 100 mg by mouth daily.   Yes [provider]  famotidine (PEPCID) 20 MG tablet Take 20 mg by mouth daily.   Yes [provider]  folic acid (FOLVITE) 1 MG tablet  TAKE 2 TABLETS BY MOUTH EVERY DAY 02/12/22  Yes Ennever, Rudell Cobb, MD  furosemide (LASIX) 40 MG tablet Take 1 tablet (40 mg total) by mouth daily. 02/22/21  Yes Johnson, Clanford L, MD  glipiZIDE (GLUCOTROL) 5 MG tablet Take 5 mg by mouth daily before breakfast. 3 per day, 2 qam and 1 pm daily   Yes [provider]  insulin glargine (LANTUS) 100 UNIT/ML injection Inject 0.1 mLs (10 Units total) into the skin at bedtime. Patient taking differently: Inject 18-20 Units into the skin at bedtime. 02/21/21  Yes Johnson, Clanford L, MD  loperamide (IMODIUM) 2 MG capsule Take 2 mg by mouth as needed for diarrhea or loose stools.   Yes [provider]  loratadine (CLARITIN) 10 MG tablet Take 10 mg by mouth daily.   Yes [provider]  Magnesium Cl-Calcium Carbonate (SLOW-MAG PO) Take 1 tablet by mouth daily.    Yes [provider]  metoprolol succinate (TOPROL-XL) 50 MG 24 hr tablet Take 50 mg by mouth daily.   Yes [provider]  pravastatin (PRAVACHOL) 10 MG tablet Take 10 mg by mouth at bedtime.    Yes [provider]  tacrolimus (PROGRAF) 0.5 MG capsule Take 1 mg by mouth See admin instructions. 2 qam and 3 qpm   Yes [provider]  vitamin B-12 (CYANOCOBALAMIN) 500 MCG tablet Take 1,000 mcg by mouth at bedtime.   Yes [provider]  B-D ULTRAFINE III SHORT PEN 31G X 8 MM MISC Inject into the skin daily. 04/11/21   [provider]  glipiZIDE (GLUCOTROL XL)  5 MG 24 hr tablet Take 1 tablet (5 mg total) by mouth daily with breakfast. Patient not taking: Reported on 04/25/2022 02/21/21   Murlean Iba, MD  [facility] ULTRA test strip 3 (three) times daily. 02/26/21   [provider]    ___________________________________________________________________________________________________ Physical Exam:    01/14/2023   10:50 PM 01/14/2023    9:35 PM 01/14/2023    9:30 PM  Vitals with BMI  Systolic   454  Diastolic   68   Pulse 93 80 81     1. General:  in No  Acute distress   Chronically ill  -appearing 2. Psychological: Alert and   Oriented 3. Head/ENT:   Moist  Mucous Membranes                          Head Non traumatic, neck supple                            Poor Dentition On BIPAP 4. SKIN: normal     Skin turgor,  Skin clean Dry and intact no rash 5. Heart: Regular rate and rhythm  systolic Murmur, no Rub or gallop 6. Lungs:  no wheezes or crackles   7. Abdomen: Soft,  non-tender, Non distended bowel sounds present 8. Lower extremities: no clubbing, cyanosis, no  edema 9. Neurologically Grossly intact, moving all 4 extremities equally   10. MSK: Normal range of motion    Chart has been reviewed  ______________________________________________________________________________________________  Assessment/Plan 72 y.o. male with medical history significant of     DM2, OSA on BiPAP, diastolic CHF, sp Renal transplant due to hx of  ESRD secondary to IgA nephropathy s/p DDKT on 12/01/2011, recurent UTI, history of prostate cancer s/p radical prostatectomy in 2011.    Admitted for  Acute respiratory failure with hypoxia and hypercarbia,  COVID-19, AKI on CKD, Urinary tract infection without hematuria, new A.fib On BIPAP for OSA   Present on Admission:  Acute on chronic heart failure with preserved ejection fraction (HFpEF) (HCC)  Chronic diastolic heart failure (HCC)  OSA (obstructive sleep apnea)  Anemia due to chronic kidney disease  A-fib ([place])  Pneumonia due to COVID-19 virus  Acute respiratory failure with hypoxia and hypercapnia (HCC)  Sepsis secondary to UTI ([place])  AKI (acute kidney injury) ([place])     History of renal transplantation Followed by [place] [facility] and [facility] transplant 11 years ago.  Only on Prograf. Has been slowly failing Nephrology is aware and has been watching it. Nephrology is aware of the patient discuss with Dr. Joelyn Oms at night.  Okay to keep at  [facility] long no emergent indication for hemodialysis at this time.  Will treat underlying pneumonia  DM2 (diabetes mellitus, type 2) ([place]) Order sliding scale Lantus 15 units nightly hide adjust as needed Hold p.o. meds  Chronic diastolic heart failure ([place]) Patient received 80 mg of Lasix IV down at freestanding ER.  Continue to monitor fluid status. Chest x-ray findings could be consistent with COVID-pneumonia in the setting of immunocompromisation.  Noted elevated BNP but also very poor clearance.  Will obtain echogram to further evaluate. Cardiology appreciate their involvement  OSA (obstructive sleep apnea) Continue to use BiPAP patient is at baseline on BiPAP at night  Anemia due to chronic kidney disease Chronic stable  A-fib ([place]) ECG concerning for A-fib possibly new diagnosis for the patient. Not endorsing any risks of  bleeding.  For tonight start heparin.  Currently rate controlled continue to monitor given somewhat soft blood pressures will hold off on Toprol for tonight but may need to resume tomorrow.  Appreciate cardiology involvement.  Obtain echogram check TSH  Pneumonia due to COVID-19 virus Patient has been symptomatic for few weeks.  Now presents with hypoxia and pulmonary infiltrates.  Has history of renal transplant on immune suppressant medications which is likely why patient has developed COVID-pneumonia. Will obtain COVID serotype Treat with Solu-Medrol Patient has been symptomatic for over 5 days and BiPAP stepdown/ICU hold off on antivirals for now If progresses may benefit from pulmonology consult Check CRP sed rate LDH D-dimer Not a candidate for CTA but CT noncontrasted was somewhat concerning for dilated pulmonary artery could be evidence of pulmonary hypertension But could not rule out PE Dopplers lower extremity negative. Started on heparin given new diagnosis is of A-fib but also should cover in case there is a underlying PE Obtain echogram Check  procalcitonin For tonight continue coverage for Rocephin azithromycin   Sepsis secondary to UTI [facility]) Patient has frequent recurrent UTIs.  For right now cover with Rocephin await results of urine cultures  AKI (acute kidney injury) [facility]) Appreciate nephrology consult suspect progression of renal transplant failure. Obtain electrolytes renal transplant ultrasound in the morning Discussed with urology who is comfortable for patient to stay in at [facility] at this time will help [place] and continue to consult while patient is admitted  Acute respiratory failure with hypoxia and hypercapnia ([place])  this patient has acute respiratory failure with Hypoxia and  Hypercarbia as documented by the presence of following: O2 saturatio< 90% on RA  5 with pCO2 >50   Likely due to:   Pneumonia,   COVID pneumonia Provide O2 therapy and titrate as needed  Continuous pulse ox   check Pulse ox with ambulation prior to discharge   may need  TC consult for home O2 set up   Prone if able  flutter valve ordered  Changed biPAP setting will repeat VBG in 1 h Pt is tolerating well   Other plan as per orders.  DVT prophylaxis:   heparin    Code Status:    Code Status: Prior FULL CODE  as per patient   I had personally discussed CODE STATUS with patient and family   Family Communication:   Family  at  Bedside  plan of care was discussed  with   Wife,   Disposition Plan:      To home once workup is complete and patient is stable   Following barriers for discharge:                            Electrolytes corrected                              Afebrile, white count improving able to transition to PO antibiotics                             Will need to be able to tolerate PO  Will need consultants to evaluate patient prior to discharge                       Transition of care consulted                      Consults called: Nephrology is aware Sent msg  to cardiology   Admission status:  ED Disposition     ED Disposition  [place]: [facility] [[id number]]  Level of Care: Stepdown [14]  Admit to SDU based on following criteria: Respiratory Distress:  Frequent assessment and/or intervention to maintain adequate ventilation/respiration, pulmonary toilet, and respiratory treatment.  May [facility] to [facility] or [facility] if equivalent level of care is available:: Yes  Interfacility transfer: Yes  Covid Evaluation: Confirmed COVID Positive  Diagnosis: Acute on chronic heart failure with preserved ejection fraction (HFpEF) ([place]) [[id number]]  Admitting Physician: Neena Rhymes [[id number]]  Attending Physician: Neena Rhymes [[id number]]  Certification:: I certify this patient will need inpatient services for at least 2 midnights  Estimated Length of Stay: 3            inpatient     I Expect 2 midnight stay secondary to severity of patient's current illness need for inpatient interventions justified by the following:  hemodynamic instability despite optimal treatment ( hypoxia, hypercapnia)   Severe lab/radiological/exam abnormalities including:    Covid pna and extensive comorbidities including:  DM2    CHF  CKD   malignancy, Hx of transplant  That are currently affecting medical management.   I expect  patient to be hospitalized for 2 midnights requiring inpatient medical care.  Patient is at high risk for adverse outcome (such as loss of life or disability) if not treated.  Indication for inpatient stay as follows:    Hemodynamic instability despite maximal medical therapy,    New or worsening hypoxia   Need for IV antibiotics, IV fluids, IV rate controling medications, IV antihypertensives, IV pain medications, IV anticoagulation, need for biPAP    Level of care   Stepdown tele indefinitely please discontinue once patient no longer qualifies COVID-19 Labs     Lab Results  Component Value Date   SARSCOV2NAA POSITIVE (A) 01/14/2023     Precautions: admitted as Covid positive Airborne and Contact precautions     Ellianne Gowen 01/15/2023, 1:59 AM    [facility]     after 2 AM please page floor coverage PA If 7AM-7PM, please contact the day team taking care of the patient using [email or web address]   Patient was evaluated in the context of the global COVID-19 pandemic, which necessitated consideration that the patient might be at risk for infection with the SARS-CoV-2 virus that causes COVID-19. Institutional protocols and algorithms that pertain to the evaluation of patients at risk for COVID-19 are in a state of rapid change based on information released by regulatory bodies including the CDC and federal and state organizations. These policies and algorithms were followed during the patient's care.

## 2023-01-14 NOTE — ED Notes (Signed)
RT in to see patient. Updated patient and family on plan of care.

## 2023-01-15 ENCOUNTER — Inpatient Hospital Stay (HOSPITAL_COMMUNITY): Payer: Medicare Other

## 2023-01-15 DIAGNOSIS — I4891 Unspecified atrial fibrillation: Secondary | ICD-10-CM | POA: Diagnosis not present

## 2023-01-15 DIAGNOSIS — A419 Sepsis, unspecified organism: Secondary | ICD-10-CM | POA: Diagnosis present

## 2023-01-15 DIAGNOSIS — I5033 Acute on chronic diastolic (congestive) heart failure: Secondary | ICD-10-CM | POA: Diagnosis not present

## 2023-01-15 DIAGNOSIS — Z940 Kidney transplant status: Secondary | ICD-10-CM | POA: Diagnosis not present

## 2023-01-15 DIAGNOSIS — U071 COVID-19: Secondary | ICD-10-CM | POA: Diagnosis present

## 2023-01-15 DIAGNOSIS — J9601 Acute respiratory failure with hypoxia: Secondary | ICD-10-CM | POA: Diagnosis not present

## 2023-01-15 DIAGNOSIS — I480 Paroxysmal atrial fibrillation: Secondary | ICD-10-CM | POA: Diagnosis not present

## 2023-01-15 DIAGNOSIS — N390 Urinary tract infection, site not specified: Secondary | ICD-10-CM | POA: Diagnosis present

## 2023-01-15 DIAGNOSIS — I441 Atrioventricular block, second degree: Secondary | ICD-10-CM | POA: Diagnosis not present

## 2023-01-15 DIAGNOSIS — J1282 Pneumonia due to coronavirus disease 2019: Secondary | ICD-10-CM | POA: Diagnosis present

## 2023-01-15 DIAGNOSIS — N179 Acute kidney failure, unspecified: Secondary | ICD-10-CM | POA: Diagnosis not present

## 2023-01-15 LAB — CBC WITH DIFFERENTIAL/PLATELET
Abs Immature Granulocytes: 0.07 10*3/uL (ref 0.00–0.07)
Basophils Absolute: 0 10*3/uL (ref 0.0–0.1)
Basophils Relative: 1 %
Eosinophils Absolute: 0 10*3/uL (ref 0.0–0.5)
Eosinophils Relative: 0 %
HCT: 48.1 % (ref 39.0–52.0)
Hemoglobin: 13.9 g/dL (ref 13.0–17.0)
Immature Granulocytes: 1 %
Lymphocytes Relative: 5 %
Lymphs Abs: 0.4 10*3/uL — ABNORMAL LOW (ref 0.7–4.0)
MCH: 30.9 pg (ref 26.0–34.0)
MCHC: 28.9 g/dL — ABNORMAL LOW (ref 30.0–36.0)
MCV: 106.9 fL — ABNORMAL HIGH (ref 80.0–100.0)
Monocytes Absolute: 0.1 10*3/uL (ref 0.1–1.0)
Monocytes Relative: 1 %
Neutro Abs: 6.7 10*3/uL (ref 1.7–7.7)
Neutrophils Relative %: 92 %
Platelets: 136 10*3/uL — ABNORMAL LOW (ref 150–400)
RBC: 4.5 MIL/uL (ref 4.22–5.81)
RDW: 16.2 % — ABNORMAL HIGH (ref 11.5–15.5)
WBC: 7.2 10*3/uL (ref 4.0–10.5)
nRBC: 0.3 % — ABNORMAL HIGH (ref 0.0–0.2)

## 2023-01-15 LAB — BLOOD GAS, VENOUS
Acid-base deficit: 3.7 mmol/L — ABNORMAL HIGH (ref 0.0–2.0)
Acid-base deficit: 6.1 mmol/L — ABNORMAL HIGH (ref 0.0–2.0)
Acid-base deficit: 5.9 mmol/L — ABNORMAL HIGH (ref 0.0–2.0)
Bicarbonate: 27.6 mmol/L (ref 20.0–28.0)
Bicarbonate: 24 mmol/L (ref 20.0–28.0)
Bicarbonate: 21.1 mmol/L (ref 20.0–28.0)
Drawn by: 355291
Drawn by: 355291
O2 Saturation: 52.6 %
O2 Saturation: 73.9 %
O2 Saturation: 76.5 %
Patient temperature: 36.6
Patient temperature: 37
Patient temperature: 36.9
pCO2, Ven: 82 mmHg (ref 44–60)
pCO2, Ven: 69 mmHg — ABNORMAL HIGH (ref 44–60)
pCO2, Ven: 46 mmHg (ref 44–60)
pH, Ven: 7.14 — CL (ref 7.25–7.43)
pH, Ven: 7.15 — CL (ref 7.25–7.43)
pH, Ven: 7.27 (ref 7.25–7.43)
pO2, Ven: <31 mmHg — CL (ref 32–45)
pO2, Ven: 43 mmHg (ref 32–45)
pO2, Ven: 40 mmHg (ref 32–45)

## 2023-01-15 LAB — LACTATE DEHYDROGENASE: LDH: 106 U/L (ref 98–192)

## 2023-01-15 LAB — MRSA NEXT GEN BY PCR, NASAL: MRSA by PCR Next Gen: NOT DETECTED

## 2023-01-15 LAB — GLUCOSE, CAPILLARY
Glucose-Capillary: 163 mg/dL — ABNORMAL HIGH (ref 70–99)
Glucose-Capillary: 144 mg/dL — ABNORMAL HIGH (ref 70–99)
Glucose-Capillary: 150 mg/dL — ABNORMAL HIGH (ref 70–99)
Glucose-Capillary: 223 mg/dL — ABNORMAL HIGH (ref 70–99)
Glucose-Capillary: 263 mg/dL — ABNORMAL HIGH (ref 70–99)

## 2023-01-15 LAB — COMPREHENSIVE METABOLIC PANEL
ALT: 14 U/L (ref 0–44)
AST: 12 U/L — ABNORMAL LOW (ref 15–41)
Albumin: 3.3 g/dL — ABNORMAL LOW (ref 3.5–5.0)
Alkaline Phosphatase: 69 U/L (ref 38–126)
Anion gap: 11 (ref 5–15)
BUN: 76 mg/dL — ABNORMAL HIGH (ref 8–23)
CO2: 24 mmol/L (ref 22–32)
Calcium: 8.5 mg/dL — ABNORMAL LOW (ref 8.9–10.3)
Chloride: 100 mmol/L (ref 98–111)
Creatinine, Ser: 5.27 mg/dL — ABNORMAL HIGH (ref 0.61–1.24)
GFR, Estimated: 11 mL/min — ABNORMAL LOW (ref >60)
Glucose, Bld: 182 mg/dL — ABNORMAL HIGH (ref 70–99)
Potassium: 6 mmol/L — ABNORMAL HIGH (ref 3.5–5.1)
Sodium: 135 mmol/L (ref 135–145)
Total Bilirubin: 0.7 mg/dL (ref 0.3–1.2)
Total Protein: 6.9 g/dL (ref 6.5–8.1)

## 2023-01-15 LAB — MAGNESIUM: Magnesium: 2.8 mg/dL — ABNORMAL HIGH (ref 1.7–2.4)

## 2023-01-15 LAB — PROTIME-INR
INR: 1.2 (ref 0.8–1.2)
Prothrombin Time: 14.6 seconds (ref 11.4–15.2)

## 2023-01-15 LAB — ECHOCARDIOGRAM COMPLETE
AR max vel: 1.5 cm2
AV Area VTI: 1.64 cm2
AV Area mean vel: 1.4 cm2
AV Mean grad: 17 mmHg
AV Peak grad: 27.9 mmHg
Ao pk vel: 2.64 m/s
Area-P 1/2: 3.34 cm2
Height: 69 in
S' Lateral: 2.6 cm
Weight: 2928 oz

## 2023-01-15 LAB — FIBRINOGEN: Fibrinogen: 466 mg/dL (ref 210–475)

## 2023-01-15 LAB — HEPARIN LEVEL (UNFRACTIONATED): Heparin Unfractionated: 0.26 IU/mL — ABNORMAL LOW (ref 0.30–0.70)

## 2023-01-15 LAB — HEMOGLOBIN A1C
Hgb A1c MFr Bld: 7.7 % — ABNORMAL HIGH (ref 4.8–5.6)
Mean Plasma Glucose: 174.29 mg/dL

## 2023-01-15 LAB — C-REACTIVE PROTEIN: CRP: 1 mg/dL — ABNORMAL HIGH (ref <1.0)

## 2023-01-15 LAB — HIV ANTIBODY (ROUTINE TESTING W REFLEX): HIV Screen 4th Generation wRfx: NONREACTIVE

## 2023-01-15 LAB — CK: Total CK: 24 U/L — ABNORMAL LOW (ref 49–397)

## 2023-01-15 LAB — FERRITIN: Ferritin: 367 ng/mL — ABNORMAL HIGH (ref 24–336)

## 2023-01-15 LAB — D-DIMER, QUANTITATIVE: D-Dimer, Quant: 0.68 ug/mL-FEU — ABNORMAL HIGH (ref 0.00–0.50)

## 2023-01-15 LAB — CREATININE, URINE, RANDOM: Creatinine, Urine: 134 mg/dL

## 2023-01-15 LAB — PROCALCITONIN: Procalcitonin: 0.16 ng/mL

## 2023-01-15 LAB — APTT: aPTT: 28 seconds (ref 24–36)

## 2023-01-15 LAB — SODIUM, URINE, RANDOM: Sodium, Ur: <10 mmol/L

## 2023-01-15 LAB — TSH: TSH: 1.077 u[IU]/mL (ref 0.350–4.500)

## 2023-01-15 LAB — PHOSPHORUS: Phosphorus: 5.9 mg/dL — ABNORMAL HIGH (ref 2.5–4.6)

## 2023-01-15 LAB — STREP PNEUMONIAE URINARY ANTIGEN: Strep Pneumo Urinary Antigen: NEGATIVE

## 2023-01-15 MED ORDER — HEPARIN (PORCINE) 25000 UT/250ML-% IV SOLN
1250.0000 [IU]/h | INTRAVENOUS | Status: DC
  Administered 2023-01-15: 1100 [IU]/h via INTRAVENOUS
  Filled 2023-01-15: qty 250

## 2023-01-15 MED ORDER — HYDROCOD POLI-CHLORPHE POLI ER 10-8 MG/5ML PO SUER: 5.0000 mL | Freq: Two times a day (BID) | ORAL | Status: DC | PRN

## 2023-01-15 MED ORDER — SODIUM CHLORIDE 0.9 % IV SOLN: INTRAVENOUS | Status: DC

## 2023-01-15 MED ORDER — HYDROCODONE-ACETAMINOPHEN 5-325 MG PO TABS: 1.0000 | ORAL_TABLET | ORAL | Status: DC | PRN

## 2023-01-15 MED ORDER — ACETAMINOPHEN 650 MG RE SUPP: 650.0000 mg | Freq: Four times a day (QID) | RECTAL | Status: DC | PRN

## 2023-01-15 MED ORDER — CALCITRIOL 0.25 MCG PO CAPS
0.2500 ug | ORAL_CAPSULE | Freq: Every day | ORAL | Status: DC
  Administered 2023-01-15 – 2023-01-25 (×11): 0.25 ug via ORAL
  Filled 2023-01-15 (×11): qty 1

## 2023-01-15 MED ORDER — CINACALCET HCL 30 MG PO TABS
30.0000 mg | ORAL_TABLET | ORAL | Status: DC
  Administered 2023-01-15 – 2023-01-21 (×5): 30 mg via ORAL
  Filled 2023-01-15 (×5): qty 1

## 2023-01-15 MED ORDER — ORAL CARE MOUTH RINSE: 15.0000 mL | OROMUCOSAL | Status: DC | PRN

## 2023-01-15 MED ORDER — POLYETHYLENE GLYCOL 3350 17 G PO PACK: 17.0000 g | PACK | Freq: Every day | ORAL | Status: DC | PRN

## 2023-01-15 MED ORDER — HEPARIN BOLUS VIA INFUSION
3000.0000 [IU] | Freq: Once | INTRAVENOUS | Status: AC
  Administered 2023-01-15: 3000 [IU] via INTRAVENOUS
  Filled 2023-01-15: qty 3000

## 2023-01-15 MED ORDER — INSULIN ASPART 100 UNIT/ML IJ SOLN
0.0000 [IU] | INTRAMUSCULAR | Status: DC
  Administered 2023-01-15: 2 [IU] via SUBCUTANEOUS
  Administered 2023-01-15: 1 [IU] via SUBCUTANEOUS

## 2023-01-15 MED ORDER — TACROLIMUS 1 MG PO CAPS
3.0000 mg | ORAL_CAPSULE | Freq: Every day | ORAL | Status: DC
  Administered 2023-01-15 – 2023-01-19 (×6): 3 mg via ORAL
  Filled 2023-01-15 (×8): qty 3

## 2023-01-15 MED ORDER — PRAVASTATIN SODIUM 10 MG PO TABS
10.0000 mg | ORAL_TABLET | Freq: Every day | ORAL | Status: DC
  Administered 2023-01-15 – 2023-01-20 (×7): 10 mg via ORAL
  Filled 2023-01-15 (×7): qty 1

## 2023-01-15 MED ORDER — ACETAMINOPHEN 325 MG PO TABS: 650.0000 mg | ORAL_TABLET | Freq: Four times a day (QID) | ORAL | Status: DC | PRN

## 2023-01-15 MED ORDER — TACROLIMUS 1 MG PO CAPS
2.0000 mg | ORAL_CAPSULE | Freq: Every day | ORAL | Status: DC
  Administered 2023-01-15 – 2023-01-18 (×4): 2 mg via ORAL
  Filled 2023-01-15 (×5): qty 2

## 2023-01-15 MED ORDER — HEPARIN BOLUS VIA INFUSION
1200.0000 [IU] | Freq: Once | INTRAVENOUS | Status: AC
  Administered 2023-01-15: 1200 [IU] via INTRAVENOUS
  Filled 2023-01-15: qty 1200

## 2023-01-15 MED ORDER — DOCUSATE SODIUM 100 MG PO CAPS
100.0000 mg | ORAL_CAPSULE | Freq: Two times a day (BID) | ORAL | Status: DC
  Administered 2023-01-15 – 2023-01-21 (×12): 100 mg via ORAL
  Filled 2023-01-15 (×11): qty 1

## 2023-01-15 MED ORDER — ALLOPURINOL 100 MG PO TABS
100.0000 mg | ORAL_TABLET | Freq: Every day | ORAL | Status: DC
  Administered 2023-01-15 – 2023-01-21 (×7): 100 mg via ORAL
  Filled 2023-01-15 (×7): qty 1

## 2023-01-15 MED ORDER — INSULIN ASPART 100 UNIT/ML IJ SOLN
0.0000 [IU] | Freq: Every day | INTRAMUSCULAR | Status: DC
  Administered 2023-01-15: 3 [IU] via SUBCUTANEOUS
  Administered 2023-01-16: 2 [IU] via SUBCUTANEOUS
  Administered 2023-01-17: 4 [IU] via SUBCUTANEOUS

## 2023-01-15 MED ORDER — INSULIN ASPART 100 UNIT/ML IJ SOLN
0.0000 [IU] | Freq: Three times a day (TID) | INTRAMUSCULAR | Status: DC
  Administered 2023-01-15: 2 [IU] via SUBCUTANEOUS
  Administered 2023-01-15: 5 [IU] via SUBCUTANEOUS
  Administered 2023-01-16: 11 [IU] via SUBCUTANEOUS
  Administered 2023-01-16 – 2023-01-17 (×4): 8 [IU] via SUBCUTANEOUS
  Administered 2023-01-17: 5 [IU] via SUBCUTANEOUS
  Administered 2023-01-18: 3 [IU] via SUBCUTANEOUS
  Administered 2023-01-18: 2 [IU] via SUBCUTANEOUS
  Administered 2023-01-18: 8 [IU] via SUBCUTANEOUS
  Administered 2023-01-19: 3 [IU] via SUBCUTANEOUS

## 2023-01-15 MED ORDER — IPRATROPIUM BROMIDE HFA 17 MCG/ACT IN AERS
2.0000 | INHALATION_SPRAY | Freq: Four times a day (QID) | RESPIRATORY_TRACT | Status: DC
  Administered 2023-01-15: 2 via RESPIRATORY_TRACT
  Filled 2023-01-15: qty 12.9

## 2023-01-15 MED ORDER — LORATADINE 10 MG PO TABS
10.0000 mg | ORAL_TABLET | Freq: Every day | ORAL | Status: DC
  Administered 2023-01-15 – 2023-01-21 (×7): 10 mg via ORAL
  Filled 2023-01-15 (×7): qty 1

## 2023-01-15 MED ORDER — SODIUM CHLORIDE 0.9 % IV SOLN
250.0000 mL | INTRAVENOUS | Status: DC | PRN
  Administered 2023-01-18: 250 mL via INTRAVENOUS

## 2023-01-15 MED ORDER — PREDNISONE 50 MG PO TABS
50.0000 mg | ORAL_TABLET | Freq: Every day | ORAL | Status: DC
  Administered 2023-01-18: 50 mg via ORAL
  Filled 2023-01-15: qty 1

## 2023-01-15 MED ORDER — SODIUM CHLORIDE 0.9 % IV SOLN
2.0000 g | INTRAVENOUS | Status: AC
  Administered 2023-01-15 – 2023-01-19 (×5): 2 g via INTRAVENOUS
  Filled 2023-01-15 (×5): qty 20

## 2023-01-15 MED ORDER — PERFLUTREN LIPID MICROSPHERE
1.0000 mL | INTRAVENOUS | Status: AC | PRN
  Administered 2023-01-15: 2 mL via INTRAVENOUS

## 2023-01-15 MED ORDER — METHYLPREDNISOLONE SODIUM SUCC 125 MG IJ SOLR
0.5000 mg/kg | Freq: Two times a day (BID) | INTRAMUSCULAR | Status: AC
  Administered 2023-01-15 – 2023-01-17 (×6): 41.25 mg via INTRAVENOUS
  Filled 2023-01-15 (×6): qty 2

## 2023-01-15 MED ORDER — GUAIFENESIN-DM 100-10 MG/5ML PO SYRP
10.0000 mL | ORAL_SOLUTION | ORAL | Status: DC | PRN
  Administered 2023-01-20: 10 mL via ORAL
  Filled 2023-01-15: qty 10

## 2023-01-15 MED ORDER — INSULIN GLARGINE-YFGN 100 UNIT/ML ~~LOC~~ SOLN
15.0000 [IU] | Freq: Every day | SUBCUTANEOUS | Status: DC
  Administered 2023-01-15: 15 [IU] via SUBCUTANEOUS
  Filled 2023-01-15 (×2): qty 0.15

## 2023-01-15 MED ORDER — HEPARIN SODIUM (PORCINE) 5000 UNIT/ML IJ SOLN
5000.0000 [IU] | Freq: Three times a day (TID) | INTRAMUSCULAR | Status: DC
  Administered 2023-01-15 – 2023-01-19 (×11): 5000 [IU] via SUBCUTANEOUS
  Filled 2023-01-15 (×11): qty 1

## 2023-01-15 MED ORDER — INSULIN GLARGINE-YFGN 100 UNIT/ML ~~LOC~~ SOLN
18.0000 [IU] | Freq: Every day | SUBCUTANEOUS | Status: DC
  Administered 2023-01-15: 18 [IU] via SUBCUTANEOUS
  Filled 2023-01-15: qty 0.18

## 2023-01-15 MED ORDER — SODIUM CHLORIDE 0.9% FLUSH
3.0000 mL | Freq: Two times a day (BID) | INTRAVENOUS | Status: DC
  Administered 2023-01-15 – 2023-01-18 (×9): 3 mL via INTRAVENOUS

## 2023-01-15 MED ORDER — SODIUM CHLORIDE 0.9% FLUSH: 3.0000 mL | INTRAVENOUS | Status: DC | PRN

## 2023-01-15 MED ORDER — IPRATROPIUM BROMIDE HFA 17 MCG/ACT IN AERS
2.0000 | INHALATION_SPRAY | Freq: Four times a day (QID) | RESPIRATORY_TRACT | Status: DC | PRN
  Administered 2023-01-16 – 2023-01-17 (×2): 2 via RESPIRATORY_TRACT

## 2023-01-15 MED ORDER — FOLIC ACID 1 MG PO TABS
2.0000 mg | ORAL_TABLET | Freq: Every day | ORAL | Status: DC
  Administered 2023-01-15 – 2023-01-21 (×7): 2 mg via ORAL
  Filled 2023-01-15 (×7): qty 2

## 2023-01-15 MED ORDER — SODIUM CHLORIDE 0.9 % IV SOLN
500.0000 mg | INTRAVENOUS | Status: AC
  Administered 2023-01-15 – 2023-01-19 (×5): 500 mg via INTRAVENOUS
  Filled 2023-01-15 (×5): qty 5

## 2023-01-15 MED ORDER — FAMOTIDINE 20 MG PO TABS
20.0000 mg | ORAL_TABLET | Freq: Every day | ORAL | Status: DC
  Administered 2023-01-15 – 2023-01-18 (×4): 20 mg via ORAL
  Filled 2023-01-15 (×4): qty 1

## 2023-01-15 NOTE — Progress Notes (Signed)
  Echocardiogram 2D Echocardiogram has been performed.  Russell Bowers 01/15/2023, 3:52 PM

## 2023-01-15 NOTE — Assessment & Plan Note (Signed)
Continue to use BiPAP patient is at baseline on BiPAP at night

## 2023-01-15 NOTE — Assessment & Plan Note (Signed)
Followed by [place] [facility] and [facility] Renal transplant 11 years ago.  Only on Prograf. Has been slowly failing Nephrology is aware and has been watching it. Nephrology is aware of the patient discuss with Dr. Joelyn Oms at night.  Okay to keep at [facility] long no emergent indication for hemodialysis at this time.  Will treat underlying pneumonia

## 2023-01-15 NOTE — Assessment & Plan Note (Signed)
ECG concerning for A-fib possibly new diagnosis for the patient. Not endorsing any risks of bleeding.  For tonight start heparin.  Currently rate controlled continue to monitor given somewhat soft blood pressures will hold off on Toprol for tonight but may need to resume tomorrow.  Appreciate cardiology involvement.  Obtain echogram check TSH

## 2023-01-15 NOTE — Progress Notes (Signed)
ANTICOAGULATION CONSULT NOTE - Initial Consult  Pharmacy Consult for heparin Indication: atrial fibrillation  Allergies  Allergen Reactions   Azathioprine Other (See Comments)   Pollen Extract Other (See Comments)    stuffy nose    Patient Measurements: Height: '5\' 9"'$  (175.3 cm) Weight: 83 kg (183 lb) IBW/kg (Calculated) : 70.7 Heparin Dosing Weight: 83kg  Vital Signs: Temp: 97.6 F (36.4 C) (01/23 2100) Temp Source: Oral (01/23 2100) BP: 119/68 (01/23 2130) Pulse Rate: 93 (01/23 2250)  Labs: Recent Labs    01/14/23 1249 01/14/23 1319 01/14/23 1420  HGB 14.8 16.3  --   HCT 48.7 48.0  --   PLT 138*  --   --   CREATININE 5.37*  --   --   TROPONINIHS 23*  --  22*    Estimated Creatinine Clearance: 12.6 mL/min (A) (by C-G formula based on SCr of 5.37 mg/dL (H)).   Medical History: Past Medical History:  Diagnosis Date   ADHD (attention deficit hyperactivity disorder)    Allergic rhinitis    Cancer ([place])    Prostate cancer   Cellulitis and abscess of trunk 2014   history of back abscess   Cellulitis and abscess of unspecified site 2014   lower back   Detached retina    Diabetes mellitus without complication (HCC)    Elevated troponin    Fistula    OF LEFT ARM   Gout    H/O kidney transplant    History of congestive heart failure    FOLLOWED BY DR. 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 Melvyn Novas   Hypertension    Mild concentric left ventricular hypertrophy (LVH)    AND AORTIC STENOSIS FOLLOWED IN THE PAST BY CARDIOLOGIST DR. Marlou Porch.   OSA treated with BiPAP    Personal history of colonic polyps    Prostate cancer ([place]) 11/2010   PROSTATECTOMY WITH UROLOGIST DR. DAVIS   Renal failure    Shortness of breath    Sleep disorder breathing    FOLLOWED BY PULMONOLOGIST DR. Elsworth Soho      Assessment: 72 y.o. male.  With PMH  of chronic diastolic heart failure, DM2, history of renal transplant on tacrolimus, aortic stenosis, OSA on BiPAP presenting with worsening shortness of breath and cough over the past 1 to 2 weeks.  Pharmacy consulted to dose heparin drip for Afib, no prior AC noted.  Hgb 16.3, plts138, Scr 5.37  Goal of Therapy:  Heparin level 0.3-0.7 units/ml Monitor platelets by anticoagulation protocol: Yes   Plan:  Baseline labs STAT Heparin 3000 units bolus Start heparin drip at 1100 units/hr Heparin level in 8 hours Daily CBC   Dolly Rias RPh 01/15/2023, 12:42 AM

## 2023-01-15 NOTE — Inpatient Diabetes Management (Signed)
Inpatient Diabetes Program Recommendations  AACE/ADA: New Consensus Statement on Inpatient Glycemic Control (2015)  Target Ranges:  Prepandial:   less than 140 mg/dL      Peak postprandial:   less than 180 mg/dL (1-2 hours)      Critically ill patients:  140 - 180 mg/dL   Lab Results  Component Value Date   GLUCAP 144 (H) 01/15/2023   HGBA1C 7.7 (H) 01/15/2023    Review of Glycemic Control  Latest Reference Range & Units 01/15/23 04:30 01/15/23 08:45  Glucose-Capillary 70 - 99 mg/dL 163 (H) 144 (H)  (H): Data is abnormally high Diabetes history: Type 2 DM Outpatient Diabetes medications: Glipizide 5 mg QD, Lantus 18-20 units QHS Current orders for Inpatient glycemic control: Semglee 18 units QHS, Novolog 0-15 units TID & HS Solumedrol 41.25 mg BID Decadron 6 mg x 1  Inpatient Diabetes Program Recommendations:    Noted consult and in agreement with current plan of care.   Following.   Thanks, Bronson Curb, MSN, RNC-OB Diabetes Coordinator [phone] (8a-5p)

## 2023-01-15 NOTE — Progress Notes (Signed)
ANTICOAGULATION CONSULT NOTE  Pharmacy Consult for heparin Indication: atrial fibrillation  Allergies  Allergen Reactions   Azathioprine Other (See Comments)   Pollen Extract Other (See Comments)    stuffy nose    Patient Measurements: Height: '5\' 9"'$  (175.3 cm) Weight: 83 kg (183 lb) IBW/kg (Calculated) : 70.7 Heparin Dosing Weight: 83 kg  Vital Signs: Temp: 98.4 F (36.9 C) (01/24 0813) Temp Source: Axillary (01/24 0813) BP: 98/58 (01/24 0700) Pulse Rate: 84 (01/24 0813)  Labs: Recent Labs    01/14/23 1249 01/14/23 1319 01/14/23 1420 01/15/23 0042 01/15/23 0112 01/15/23 1025  HGB 14.8 16.3  --  13.9  --   --   HCT 48.7 48.0  --  48.1  --   --   PLT 138*  --   --  136*  --   --   APTT  --   --   --   --  28  --   LABPROT  --   --   --   --  14.6  --   INR  --   --   --   --  1.2  --   HEPARINUNFRC  --   --   --   --   --  0.26*  CREATININE 5.37*  --   --  5.27*  --   --   CKTOTAL  --   --   --  24*  --   --   TROPONINIHS 23*  --  22*  --   --   --      Estimated Creatinine Clearance: 12.9 mL/min (A) (by C-G formula based on SCr of 5.27 mg/dL (H)).   Medical History: Past Medical History:  Diagnosis Date   ADHD (attention deficit hyperactivity disorder)    Allergic rhinitis    Cancer ([place])    Prostate cancer   Cellulitis and abscess of trunk 2014   history of back abscess   Cellulitis and abscess of unspecified site 2014   lower back   Detached retina    Diabetes mellitus without complication (HCC)    Elevated troponin    Fistula    OF LEFT ARM   Gout    H/O kidney transplant    History of congestive heart failure    FOLLOWED BY DR. 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 Melvyn Novas   Hypertension    Mild concentric left ventricular hypertrophy (LVH)    AND AORTIC STENOSIS FOLLOWED IN THE PAST BY CARDIOLOGIST DR.  Marlou Porch.   OSA treated with BiPAP    Personal history of colonic polyps    Prostate cancer ([place]) 11/2010   PROSTATECTOMY WITH UROLOGIST DR. DAVIS   Renal failure    Shortness of breath    Sleep disorder breathing    FOLLOWED BY PULMONOLOGIST DR. Elsworth Soho      Assessment: 72 y.o. male.  With PMH of chronic diastolic heart failure, DM2, history of renal transplant on tacrolimus, aortic stenosis, OSA on BiPAP presenting with worsening shortness of breath and cough over the past 1 to 2 weeks. He is COVID positive. Noted to be in atrial fibrillation, no history of this. Pharmacy consulted to dose heparin drip for Afib, no prior AC noted.  Today, 01/15/23 -Heparin level 0.26 - subtherapeutic on heparin infusion at 1100 units/hr -Hgb slight drop, plt stable -No bleeding or infusion issues noted  Goal  of Therapy:  Heparin level 0.3-0.7 units/ml Monitor platelets by anticoagulation protocol: Yes   Plan:  -Heparin 1200 units bolus -Increase heparin infusion to 1250 units/hr -Recheck heparin level ~ 8 hours after rate change -Daily CBC -Cardiology consulted - will follow for long term anticoagulation plan as indicated   Tawnya Crook, PharmD, BCPS Clinical Pharmacist 01/15/2023 11:27 AM

## 2023-01-15 NOTE — Assessment & Plan Note (Signed)
Patient has frequent recurrent UTIs.  For right now cover with Rocephin await results of urine cultures

## 2023-01-15 NOTE — Assessment & Plan Note (Deleted)
this patient has acute respiratory failure with Hypoxia and  Hypercarbia as documented by the presence of following: O2 saturatio< 90% on RA  5 with pCO2 >50   Likely due to:   Pneumonia,   COVID pneumonia Provide O2 therapy and titrate as needed  Continuous pulse ox   check Pulse ox with ambulation prior to discharge   may need  TC consult for home O2 set up   Prone if able  flutter valve ordered

## 2023-01-15 NOTE — Assessment & Plan Note (Signed)
Patient received 80 mg of Lasix IV down at freestanding ER.  Continue to monitor fluid status. Chest x-ray findings could be consistent with COVID-pneumonia in the setting of immunocompromisation.  Noted elevated BNP but also very poor clearance.  Will obtain echogram to further evaluate. Cardiology appreciate their involvement

## 2023-01-15 NOTE — Assessment & Plan Note (Signed)
Chronic stable ?

## 2023-01-15 NOTE — Progress Notes (Signed)
Critical lab values reported to [place], MD:  0100: pH 7.14; pCO2 82; pO2 <31  0330: pH 7.15

## 2023-01-15 NOTE — Assessment & Plan Note (Addendum)
Order sliding scale Lantus 15 units nightly hide adjust as needed Hold p.o. meds

## 2023-01-15 NOTE — Progress Notes (Incomplete)
[facility]                                                                              Russell Bowers, is a 72 y.o. male, DOB - May 08, 1951, JSR:[id number] Admit date - 01/14/2023    Outpatient Primary MD for the patient is Harrington Challenger, Dwyane Luo, MD  LOS - 1  days  Chief Complaint  Patient presents with   Fatigue   Cough       Brief summary      Assessment & Plan    Principal Problem:   Acute on chronic heart failure with preserved ejection fraction (HFpEF) ([place]) Active Problems:   Acute respiratory failure with hypoxia and hypercapnia ([place])   History of renal transplantation   AKI (acute kidney injury) ([place])   DM2 (diabetes mellitus, type 2) ([facility])   Chronic diastolic heart failure ([facility])   OSA (obstructive sleep apnea)   Anemia due to chronic kidney disease   A-fib ([place])   Pneumonia due to COVID-19 virus   Sepsis secondary to UTI [facility])       RN Pressure Injury Documentation:    Malnutrition Type:      Malnutrition Characteristics:      Nutrition Interventions:     Estimated body mass index is 27.02 kg/m as calculated from the following:   Height as of this encounter: '5\' 9"'$  (1.753 m).   Weight as of this encounter: 83 kg.  Code Status: *** DVT Prophylaxis:     Level of Care: Level of care: Stepdown Family Communication: Updated patient's ** Disposition Plan:      Remains inpatient appropriate:  ***    Procedures:  ***  Consultants:   ***  Antimicrobials:   Anti-infectives (From admission, onward)    Start     Dose/Rate Route Frequency Ordered Stop   01/15/23 1300  azithromycin (ZITHROMAX) 500 mg in sodium chloride 0.9 % 250 mL IVPB        500 mg 250 mL/hr over 60 Minutes Intravenous Every 24 hours 01/15/23 0037     01/15/23 1300  cefTRIAXone (ROCEPHIN) 2 g in sodium chloride 0.9 % 100 mL IVPB        2 g 200 mL/hr over 30 Minutes Intravenous Every 24 hours 01/15/23 0037     01/14/23 1315  cefTRIAXone (ROCEPHIN) 2  g in sodium chloride 0.9 % 100 mL IVPB        2 g 200 mL/hr over 30 Minutes Intravenous  Once 01/14/23 1306 01/14/23 1525   01/14/23 1315  azithromycin (ZITHROMAX) 500 mg in sodium chloride 0.9 % 250 mL IVPB        500 mg 250 mL/hr over 60 Minutes Intravenous  Once 01/14/23 1306 01/14/23 1908          Medications  allopurinol  100 mg Oral Daily   calcitRIOL  0.25 mcg Oral Daily   Chlorhexidine Gluconate Cloth  6 each Topical Q0600   cinacalcet  30 mg Oral Q48H   famotidine  20 mg Oral Daily   folic acid  2 mg Oral Daily   insulin aspart  0-9 Units Subcutaneous Q4H   insulin  glargine-yfgn  15 Units Subcutaneous QHS   loratadine  10 mg Oral Daily   methylPREDNISolone (SOLU-MEDROL) injection  0.5 mg/kg Intravenous Q12H   Followed by   Derrill Memo ON 01/18/2023] predniSONE  50 mg Oral Daily   mouth rinse  15 mL Mouth Rinse 4 times per day   pravastatin  10 mg Oral QHS   sodium chloride flush  3 mL Intravenous Q12H   tacrolimus  2 mg Oral Daily   tacrolimus  3 mg Oral QHS      Subjective:   Russell Bowers was seen and examined today.  *** Patient denies dizziness, chest pain, shortness of breath, abdominal pain, N/V/D/C, new weakness, numbess, tingling. No acute events overnight.    Objective:   Vitals:   01/15/23 0400 01/15/23 0500 01/15/23 0600 01/15/23 0700  BP: 103/72 (!) 104/46 (!) 111/51 (!) 98/58  Pulse: 81 91 (!) 109 82  Resp: '14 17 10 16  '$ Temp:      TempSrc:      SpO2: 95% 95% 98% 97%  Weight:      Height:        Intake/Output Summary (Last 24 hours) at 01/15/2023 0741 Last data filed at 01/15/2023 4098 Gross per 24 hour  Intake 372.41 ml  Output 200 ml  Net 172.41 ml     Wt Readings from Last 3 Encounters:  01/14/23 83 kg  04/25/22 81.7 kg  07/26/21 79.4 kg     Exam General: Alert and oriented x 3, NAD Cardiovascular: S1 S2 auscultated,  RRR Respiratory: Clear to auscultation bilaterally, no wheezing, rales or rhonchi Gastrointestinal: Soft,  nontender, nondistended, + bowel sounds Ext: no pedal edema bilaterally Neuro: Strength 5/5 upper and lower extremities bilaterally Skin: No rashes Psych: Normal affect and demeanor, alert and oriented x3     Data Reviewed:  I have personally reviewed following labs    CBC Lab Results  Component Value Date   WBC 7.2 01/15/2023   RBC 4.50 01/15/2023   HGB 13.9 01/15/2023   HCT 48.1 01/15/2023   MCV 106.9 (H) 01/15/2023   MCH 30.9 01/15/2023   PLT 136 (L) 01/15/2023   MCHC 28.9 (L) 01/15/2023   RDW 16.2 (H) 01/15/2023   LYMPHSABS 0.4 (L) 01/15/2023   MONOABS 0.1 01/15/2023   EOSABS 0.0 01/15/2023   BASOSABS 0.0 11/91/4782     Last metabolic panel Lab Results  Component Value Date   NA 135 01/15/2023   K 6.0 (H) 01/15/2023   CL 100 01/15/2023   CO2 24 01/15/2023   BUN 76 (H) 01/15/2023   CREATININE 5.27 (H) 01/15/2023   GLUCOSE 182 (H) 01/15/2023   GFRNONAA 11 (L) 01/15/2023   GFRAA 39 (L) 08/10/2014   CALCIUM 8.5 (L) 01/15/2023   PHOS 5.9 (H) 01/15/2023   PROT 6.9 01/15/2023   ALBUMIN 3.3 (L) 01/15/2023   BILITOT 0.7 01/15/2023   ALKPHOS 69 01/15/2023   AST 12 (L) 01/15/2023   ALT 14 01/15/2023   ANIONGAP 11 01/15/2023    CBG (last 3)  Recent Labs    01/14/23 2145 01/15/23 0430  GLUCAP 161* 163*      Coagulation Profile: Recent Labs  Lab 01/15/23 0112  INR 1.2     Radiology Studies: I have personally reviewed the imaging studies  US Venous Img Lower Unilateral Right  Result Date: 01/14/2023 CLINICAL DATA:  Pain and swelling EXAM: Right LOWER EXTREMITY VENOUS DOPPLER ULTRASOUND TECHNIQUE: Gray-scale sonography with compression, as well as color and duplex ultrasound, were performed  to evaluate the deep venous system(s) from the level of the common femoral vein through the popliteal and proximal calf veins. COMPARISON:  None Available. FINDINGS: VENOUS Normal compressibility of the common femoral, superficial femoral, and popliteal veins, as well  as the visualized calf veins. Visualized portions of profunda femoral vein and great saphenous vein unremarkable. No filling defects to suggest DVT on grayscale or color Doppler imaging. Doppler waveforms show normal direction of venous flow, normal respiratory plasticity and response to augmentation. Limited views of the contralateral common femoral vein are unremarkable. OTHER None. Limitations: none IMPRESSION: Negative. Electronically Signed   By: Donavan Foil M.D.   On: 01/14/2023 15:27   CT Chest Wo Contrast  Result Date: 01/14/2023 CLINICAL DATA:  Hypoxia and CHF EXAM: CT CHEST WITHOUT CONTRAST TECHNIQUE: Multidetector CT imaging of the chest was performed following the standard protocol without IV contrast. RADIATION DOSE REDUCTION: This exam was performed according to the departmental dose-optimization program which includes automated exposure control, adjustment of the mA and/or kV according to patient size and/or use of iterative reconstruction technique. COMPARISON:  Chest CT dated March 21, 2014 FINDINGS: Cardiovascular: Cardiomegaly. Pericardial effusion. Normal caliber thoracic aorta with moderate calcified plaque. Severe left main and three-vessel coronary artery calcifications. Aortic valve calcifications. Dilated main and distal pulmonary arteries, main pulmonary artery measures up to 4.0 cm. Mediastinum/Nodes: Esophagus and thyroid are unremarkable. No pathologically enlarged lymph nodes seen in the chest. Lungs/Pleura: Central airways are patent. Bibasilar linear opacities similar to prior exam and likely due to scarring. Small bilateral pleural effusions. Solid pulmonary nodule of the posterior left upper lobe measuring 5 mm on series 4, image 28, not definitely present on prior exam although differences in slice thickness limit evaluation Upper Abdomen: Gallstones. Small volume ascites of the partially visualized abdomen. Musculoskeletal: No chest wall mass or suspicious bone lesions  identified. IMPRESSION: 1. Small bilateral pleural effusions and small volume ascites of the partially visualized abdomen. 2. Dilated main and distal pulmonary arteries, findings can be seen in the setting of pulmonary hypertension. 3. Solid pulmonary nodule of the posterior left upper lobe measuring 5 mm. No follow-up needed if patient is low-risk.This recommendation follows the consensus statement: Guidelines for Management of Incidental Pulmonary Nodules Detected on CT Images: From the Fleischner Society 2017; Radiology 2017; 284:228-243. 4. Cardiomegaly and aortic Atherosclerosis (ICD10-I70.0). Electronically Signed   By: Yetta Glassman M.D.   On: 01/14/2023 13:35   DG Chest Portable 1 View  Result Date: 01/14/2023 CLINICAL DATA:  Hypoxia EXAM: PORTABLE CHEST 1 VIEW COMPARISON:  02/19/2021 FINDINGS: The heart size and mediastinal contours are within normal limits. Low lung volumes with bibasilar atelectasis. Blunting of the bilateral costophrenic angles for which trace pleural effusions are not excluded. Reticulonodular opacities bilaterally. No pneumothorax. The visualized skeletal structures are unremarkable. IMPRESSION: 1. Reticulonodular opacities bilaterally, which may reflect atypical/viral infection. 2. Low lung volumes with bibasilar atelectasis. Electronically Signed   By: Davina Poke D.O.   On: 01/14/2023 12:41       Tramond Slinker M.D. [facility] 01/15/2023, 7:41 AM  Available via Epic secure chat 7am-7pm After 7 pm, please refer to night coverage provider listed on amion.

## 2023-01-15 NOTE — Consult Note (Signed)
Cardiology Consultation   Patient ID: Russell Bowers MRN: [id number]; DOB: 02-06-1951  Admit date: 01/14/2023 Date of Consult: 01/15/2023  PCP:  [name], MD   [facility] Cardiologist:  Candee Furbish, MD        Patient Profile:   Russell Bowers is a 72 y.o. male with a hx of DM2, OSA on BiPAP, diastolic CHF, Renal transplant due to ESRD secondary to IgA nephropathy s/p DDKT on 12/01/2011, recurent UTI, history of prostate cancer s/p radical prostatectomy in 2011 who is being seen 01/15/2023 for the evaluation of afib at the request of Dr. Tana Coast.  History of Present Illness:   Russell Bowers presented to the hospital on 1/23 with symptoms of dyspnea and cough x1 week, found to be COVID-19 positive. Dyspnea was primarily associated with exertion and patient admits that his exertional tolerance noticeably decreased just prior to admission. He reports noticing increased weight over the past several weeks despite compliance with home lasix regimen. Patient says that he was checked for COVID earlier this month by his PCP due to a persistent cough. Outpatient COVID test was negative. He also says that his urine output has recently decreased and he is scheduled to follow up with [facility] soon with concerns that his transplant may be failing.   His initial labs/vital signs were concerning for acute hypoxic respiratory failure and hypercarbia.  VBG pH 7.2, pCO2 77.7, pO2 19. Patient also noted to have elevated BNP, 1704.    Past Medical History:  Diagnosis Date   ADHD (attention deficit hyperactivity disorder)    Allergic rhinitis    Cancer ([employer])    Prostate cancer   Cellulitis and abscess of trunk 2014   history of back abscess   Cellulitis and abscess of unspecified site 2014   lower back   Detached retina    Diabetes mellitus without complication (HCC)    Elevated troponin    Fistula    OF LEFT ARM   Gout    H/O kidney transplant     History of congestive heart failure    FOLLOWED BY DR. 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 Melvyn Novas   Hypertension    Mild concentric left ventricular hypertrophy (LVH)    AND AORTIC STENOSIS FOLLOWED IN THE PAST BY CARDIOLOGIST DR. Marlou Porch.   OSA treated with BiPAP    Personal history of colonic polyps    Prostate cancer ([place]) 11/2010   PROSTATECTOMY WITH UROLOGIST DR. DAVIS   Renal failure    Shortness of breath    Sleep disorder breathing    FOLLOWED BY PULMONOLOGIST DR. Elsworth Soho    Past Surgical History:  Procedure Laterality Date   CATARACT EXTRACTION, BILATERAL  11/2014   COLONOSCOPY  09/2014   KIDNEY TRANSPLANT  12/01/2011   PROSTATECTOMY     FOR PROSTATE CANCER   prostectomy  11/2010   RETINAL DETACHMENT SURGERY  06/2002     Home Medications:  Prior to Admission medications   Medication Sig Start Date End Date Taking? Authorizing Provider  allopurinol (ZYLOPRIM) 100 MG tablet Take 100 mg by mouth daily. 07/30/22  Yes [provider]  calcitRIOL (ROCALTROL) 0.25 MCG capsule Take 0.25 mcg by mouth daily. 11/08/20  Yes [provider]  cinacalcet (SENSIPAR) 30 MG tablet Take 30 mg by mouth every other day.   Yes [provider]  docusate sodium (COLACE) 100 MG capsule Take 100 mg by mouth daily.   Yes [provider]  famotidine (PEPCID) 20 MG tablet Take 20 mg by mouth daily.   Yes [provider]  folic acid (FOLVITE) 1 MG tablet TAKE 2 TABLETS BY MOUTH EVERY DAY 02/12/22  Yes Ennever, Rudell Cobb, MD  furosemide (LASIX) 40 MG tablet Take 1 tablet (40 mg total) by mouth daily. 02/22/21  Yes Johnson, Clanford L, MD  glipiZIDE (GLUCOTROL) 5 MG tablet Take 5 mg by mouth daily before breakfast. 3 per day, 2 qam and 1 pm daily   Yes [provider]  insulin glargine (LANTUS) 100 UNIT/ML injection  Inject 0.1 mLs (10 Units total) into the skin at bedtime. Patient taking differently: Inject 18-20 Units into the skin at bedtime. 02/21/21  Yes Johnson, Clanford L, MD  loperamide (IMODIUM) 2 MG capsule Take 2 mg by mouth as needed for diarrhea or loose stools.   Yes [provider]  loratadine (CLARITIN) 10 MG tablet Take 10 mg by mouth daily.   Yes [provider]  Magnesium Cl-Calcium Carbonate (SLOW-MAG PO) Take 1 tablet by mouth daily.    Yes [provider]  metoprolol succinate (TOPROL-XL) 50 MG 24 hr tablet Take 50 mg by mouth daily.   Yes [provider]  pravastatin (PRAVACHOL) 10 MG tablet Take 10 mg by mouth at bedtime.    Yes [provider]  tacrolimus (PROGRAF) 0.5 MG capsule Take 1 mg by mouth See admin instructions. 2 qam and 3 qpm   Yes [provider]  vitamin B-12 (CYANOCOBALAMIN) 500 MCG tablet Take 1,000 mcg by mouth at bedtime.   Yes [provider]  B-D ULTRAFINE III SHORT PEN 31G X 8 MM MISC Inject into the skin daily. 04/11/21   [provider]  glipiZIDE (GLUCOTROL XL) 5 MG 24 hr tablet Take 1 tablet (5 mg total) by mouth daily with breakfast. Patient not taking: Reported on 04/25/2022 02/21/21   Murlean Iba, MD  [facility] ULTRA test strip 3 (three) times daily. 02/26/21   [provider]    Inpatient Medications: Scheduled Meds:  allopurinol  100 mg Oral Daily   calcitRIOL  0.25 mcg Oral Daily   Chlorhexidine Gluconate Cloth  6 each Topical Q0600   cinacalcet  30 mg Oral Q48H   famotidine  20 mg Oral Daily   folic acid  2 mg Oral Daily   insulin aspart  0-15 Units Subcutaneous TID WC   insulin aspart  0-5 Units Subcutaneous QHS   insulin glargine-yfgn  18 Units Subcutaneous QHS   loratadine  10 mg Oral Daily   methylPREDNISolone (SOLU-MEDROL) injection  0.5 mg/kg Intravenous Q12H   Followed by   Derrill Memo ON 01/18/2023] predniSONE  50 mg Oral Daily   mouth rinse  15 mL Mouth Rinse 4  times per day   pravastatin  10 mg Oral QHS   sodium chloride flush  3 mL Intravenous Q12H   tacrolimus  2 mg Oral Daily   tacrolimus  3 mg Oral QHS   Continuous Infusions:  sodium chloride Stopped (01/14/23 1909)   sodium chloride     azithromycin (ZITHROMAX) 500 mg in sodium chloride 0.9 % 250 mL IVPB     cefTRIAXone (ROCEPHIN)  IV     heparin 1,100 Units/hr (01/15/23 0646)   PRN Meds: sodium chloride, sodium chloride, acetaminophen **OR** acetaminophen, chlorpheniramine-HYDROcodone, guaiFENesin-dextromethorphan, HYDROcodone-acetaminophen, ipratropium, mouth rinse, sodium chloride flush  Allergies:  Allergies  Allergen Reactions   Azathioprine Other (See Comments)   Pollen Extract Other (See Comments)    stuffy nose    Social History:   Social History   Socioeconomic History   Marital status: Married    Spouse name: Not on file   Number of children: Not on file   Years of education: Not on file   Highest education level: Not on file  Occupational History   Occupation: Retired  Tobacco Use   Smoking status: Former    Packs/day: 1.00    Years: 21.00    Total pack years: 21.00    Types: Cigarettes    Quit date: 03/16/1993    Years since quitting: 29.8   Smokeless tobacco: Never  Vaping Use   Vaping Use: Never used  Substance and Sexual Activity   Alcohol use: Yes    Alcohol/week: 1.0 - 2.0 standard drink of alcohol    Types: 1 - 2 Glasses of wine per week   Drug use: No   Sexual activity: Not on file  Other Topics Concern   Not on file  Social History Narrative   Not on file   Social Determinants of Health   Financial Resource Strain: Not on file  Food Insecurity: Not on file  Transportation Needs: Not on file  Physical Activity: Not on file  Stress: Not on file  Social Connections: Not on file  Intimate Partner Violence: Not on file    Family History:    Family History  Problem Relation Age of Onset   Diabetes Mother    Hypertension Mother     Atrial fibrillation Mother    Alzheimer's disease Father    Heart Problems Sister    Heart disease Maternal Grandfather    Heart disease Maternal Grandmother      ROS:  Please see the history of present illness.   All other ROS reviewed and negative.     Physical Exam/Data:   Vitals:   01/15/23 0500 01/15/23 0600 01/15/23 0700 01/15/23 0813  BP: (!) 104/46 (!) 111/51 (!) 98/58   Pulse: 91 (!) 109 82 84  Resp: '17 10 16 17  '$ Temp:    98.4 F (36.9 C)  TempSrc:    Axillary  SpO2: 95% 98% 97% 97%  Weight:      Height:        Intake/Output Summary (Last 24 hours) at 01/15/2023 1019 Last data filed at 01/15/2023 0646 Gross per 24 hour  Intake 372.41 ml  Output 200 ml  Net 172.41 ml      01/14/2023   12:21 PM 04/25/2022   11:10 AM 07/26/2021    2:19 PM  Last 3 Weights  Weight (lbs) 183 lb 180 lb 3.2 oz 175 lb  Weight (kg) 83.008 kg 81.738 kg 79.379 kg     Body mass index is 27.02 kg/m.  General:  Well nourished, well developed, on BiPAP HEENT: normal Neck: JVP elevated 1/2 to mandible Vascular: No carotid bruits; Distal pulses 2+ bilaterally Cardiac:  normal S1, S2; irregular; no murmur  Lungs:  bibasilar crackles Abd: soft, nontender, no hepatomegaly  Ext: no edema Musculoskeletal:  No deformities, BUE and BLE strength normal and equal Skin: warm and dry  Neuro:  CNs 2-12 intact, no focal abnormalities noted Psych:  Normal affect   EKG:  The EKG was personally reviewed and demonstrates:  sinus rhythm with second degree AV block, type I. RBBB pattern Telemetry:  Telemetry was personally reviewed and demonstrates:  sinus  rhythm with second degree AV block type I. Cannot rule out intermittent afib.  Relevant CV Studies:  02/18/21 TTE  IMPRESSIONS     1. Left ventricular ejection fraction, by estimation, is 60 to 65%. The  left ventricle has normal function. The left ventricle has no regional  wall motion abnormalities. There is mild left ventricular hypertrophy.   Left ventricular diastolic parameters  were normal.   2. There is flattening of the ventricular septum in systole consistent  with RV pressure overload. . Right ventricular systolic function is  normal. The right ventricular size is normal.   3. Left atrial size was moderately dilated.   4. The mitral valve is normal in structure. Trivial mitral valve  regurgitation. No evidence of mitral stenosis.   5. The aortic valve has an indeterminant number of cusps. There is  moderate calcification of the aortic valve. There is moderate thickening  of the aortic valve. Aortic valve regurgitation is mild. Mild aortic valve  stenosis.   FINDINGS   Left Ventricle: Left ventricular ejection fraction, by estimation, is 60  to 65%. The left ventricle has normal function. The left ventricle has no  regional wall motion abnormalities. The left ventricular internal cavity  size was normal in size. There is   mild left ventricular hypertrophy. Left ventricular diastolic parameters  were normal.   Right Ventricle: There is flattening of the ventricular septum in systole  consistent with RV pressure overload. The right ventricular size is  normal. Right vetricular wall thickness was not well visualized. Right  ventricular systolic function is normal.   Left Atrium: Left atrial size was moderately dilated.   Right Atrium: Right atrial size was normal in size.   Pericardium: There is no evidence of pericardial effusion.   Mitral Valve: The mitral valve is normal in structure. There is mild  thickening of the mitral valve leaflet(s). There is mild calcification of  the mitral valve leaflet(s). Mild mitral annular calcification. Trivial  mitral valve regurgitation. No evidence   of mitral valve stenosis.   Tricuspid Valve: The tricuspid valve is not well visualized. Tricuspid  valve regurgitation is not demonstrated. No evidence of tricuspid  stenosis.   Aortic Valve: The aortic valve has an  indeterminant number of cusps. There  is moderate calcification of the aortic valve. There is moderate  thickening of the aortic valve. There is moderate aortic valve annular  calcification. Aortic valve regurgitation  is mild. Mild aortic stenosis is present. Aortic valve mean gradient  measures 12.8 mmHg. Aortic valve peak gradient measures 21.5 mmHg. Aortic  valve area, by VTI measures 2.13 cm.   Pulmonic Valve: The pulmonic valve was not well visualized. Pulmonic valve  regurgitation is not visualized. No evidence of pulmonic stenosis.   Aorta: The aortic root is normal in size and structure.   Pulmonary Artery: Indeterminant PASP, inadequate TR jet.   IAS/Shunts: No atrial level shunt detected by color flow Doppler.   Laboratory Data:  High Sensitivity Troponin:   Recent Labs  Lab 01/14/23 1249 01/14/23 1420  TROPONINIHS 23* 22*     Chemistry Recent Labs  Lab 01/14/23 1249 01/14/23 1319 01/15/23 0042  NA 136 137 135  K 4.9 5.0 6.0*  CL 100  --  100  CO2 27  --  24  GLUCOSE 192*  --  182*  BUN 81*  --  76*  CREATININE 5.37*  --  5.27*  CALCIUM 9.8  --  8.5*  MG  --   --  2.8*  GFRNONAA 11*  --  11*  ANIONGAP 9  --  11    Recent Labs  Lab 01/14/23 1249 01/15/23 0042  PROT 7.4 6.9  ALBUMIN 4.0 3.3*  AST 11* 12*  ALT 12 14  ALKPHOS 74 69  BILITOT 0.7 0.7   Lipids No results for input(s): "CHOL", "TRIG", "HDL", "LABVLDL", "LDLCALC", "CHOLHDL" in the last 168 hours.  Hematology Recent Labs  Lab 01/14/23 1249 01/14/23 1319 01/15/23 0042  WBC 5.9  --  7.2  RBC 4.64  --  4.50  HGB 14.8 16.3 13.9  HCT 48.7 48.0 48.1  MCV 105.0*  --  106.9*  MCH 31.9  --  30.9  MCHC 30.4  --  28.9*  RDW 16.5*  --  16.2*  PLT 138*  --  136*   Thyroid  Recent Labs  Lab 01/15/23 0042  TSH 1.077    BNP Recent Labs  Lab 01/14/23 1249  BNP 1,704.0*    DDimer  Recent Labs  Lab 01/15/23 0112  DDIMER 0.68*     Radiology/Studies:  US Renal Transplant  w/Doppler  Result Date: 01/15/2023 CLINICAL DATA:  Acute kidney injury, post transplant EXAM: ULTRASOUND OF RENAL TRANSPLANT WITH RENAL DOPPLER ULTRASOUND TECHNIQUE: Ultrasound examination of the renal transplant was performed with gray-scale, color and duplex doppler evaluation. COMPARISON:  08/25/2022 FINDINGS: Transplant kidney location: RLQ Transplant Kidney: Renal measurements: 8 x 3.8 x 4.8 cm = volume: 4m (previously 99). Normal in size and parenchymal echogenicity. No evidence of mass or hydronephrosis. No peri-transplant fluid collection seen. Color flow in the main renal artery:  Yes Color flow in the main renal vein:  Yes Duplex Doppler Evaluation: Main Renal Artery Velocity: 47 cm/sec Main Renal Artery Resistive Index: 0.93 Venous waveform in main renal vein:  Present Intrarenal resistive index in upper pole:  0.83 (normal 0.6-0.8; equivocal 0.8-0.9; abnormal >= 0.9) Intrarenal resistive index in lower pole: 0.87 (normal 0.6-0.8; equivocal 0.8-0.9; abnormal >= 0.9) Bladder: Normal for degree of bladder distention. No ureteral jet identified. Other findings:  None. IMPRESSION: 1. Nonspecific elevated intrarenal resistive indices. 2. No hydronephrosis. Electronically Signed   By: DLucrezia EuropeM.D.   On: 01/15/2023 08:07   UKoreaVenous Img Lower Unilateral Right  Result Date: 01/14/2023 CLINICAL DATA:  Pain and swelling EXAM: Right LOWER EXTREMITY VENOUS DOPPLER ULTRASOUND TECHNIQUE: Gray-scale sonography with compression, as well as color and duplex ultrasound, were performed to evaluate the deep venous system(s) from the level of the common femoral vein through the popliteal and proximal calf veins. COMPARISON:  None Available. FINDINGS: VENOUS Normal compressibility of the common femoral, superficial femoral, and popliteal veins, as well as the visualized calf veins. Visualized portions of profunda femoral vein and great saphenous vein unremarkable. No filling defects to suggest DVT on grayscale or  color Doppler imaging. Doppler waveforms show normal direction of venous flow, normal respiratory plasticity and response to augmentation. Limited views of the contralateral common femoral vein are unremarkable. OTHER None. Limitations: none IMPRESSION: Negative. Electronically Signed   By: KDonavan FoilM.D.   On: 01/14/2023 15:27   CT Chest Wo Contrast  Result Date: 01/14/2023 CLINICAL DATA:  Hypoxia and CHF EXAM: CT CHEST WITHOUT CONTRAST TECHNIQUE: Multidetector CT imaging of the chest was performed following the standard protocol without IV contrast. RADIATION DOSE REDUCTION: This exam was performed according to the departmental dose-optimization program which includes automated exposure control, adjustment of the mA and/or kV according to patient size and/or use of iterative reconstruction technique.  COMPARISON:  Chest CT dated March 21, 2014 FINDINGS: Cardiovascular: Cardiomegaly. Pericardial effusion. Normal caliber thoracic aorta with moderate calcified plaque. Severe left main and three-vessel coronary artery calcifications. Aortic valve calcifications. Dilated main and distal pulmonary arteries, main pulmonary artery measures up to 4.0 cm. Mediastinum/Nodes: Esophagus and thyroid are unremarkable. No pathologically enlarged lymph nodes seen in the chest. Lungs/Pleura: Central airways are patent. Bibasilar linear opacities similar to prior exam and likely due to scarring. Small bilateral pleural effusions. Solid pulmonary nodule of the posterior left upper lobe measuring 5 mm on series 4, image 28, not definitely present on prior exam although differences in slice thickness limit evaluation Upper Abdomen: Gallstones. Small volume ascites of the partially visualized abdomen. Musculoskeletal: No chest wall mass or suspicious bone lesions identified. IMPRESSION: 1. Small bilateral pleural effusions and small volume ascites of the partially visualized abdomen. 2. Dilated main and distal pulmonary arteries,  findings can be seen in the setting of pulmonary hypertension. 3. Solid pulmonary nodule of the posterior left upper lobe measuring 5 mm. No follow-up needed if patient is low-risk.This recommendation follows the consensus statement: Guidelines for Management of Incidental Pulmonary Nodules Detected on CT Images: From the Fleischner Society 2017; Radiology 2017; 284:228-243. 4. Cardiomegaly and aortic Atherosclerosis (ICD10-I70.0). Electronically Signed   By: Yetta Glassman M.D.   On: 01/14/2023 13:35   DG Chest Portable 1 View  Result Date: 01/14/2023 CLINICAL DATA:  Hypoxia EXAM: PORTABLE CHEST 1 VIEW COMPARISON:  02/19/2021 FINDINGS: The heart size and mediastinal contours are within normal limits. Low lung volumes with bibasilar atelectasis. Blunting of the bilateral costophrenic angles for which trace pleural effusions are not excluded. Reticulonodular opacities bilaterally. No pneumothorax. The visualized skeletal structures are unremarkable. IMPRESSION: 1. Reticulonodular opacities bilaterally, which may reflect atypical/viral infection. 2. Low lung volumes with bibasilar atelectasis. Electronically Signed   By: Davina Poke D.O.   On: 01/14/2023 12:41     Assessment and Plan:  CLEBERT WENGER is a 72 y.o. male with a hx of DM2, OSA on BiPAP, diastolic CHF, Renal transplant due to ESRD secondary to IgA nephropathy s/p DDKT on 12/01/2011, recurent UTI, history of prostate cancer s/p radical prostatectomy in 2011 who is being seen 01/15/2023 for the evaluation of new afib at the request of Dr. Tana Coast.  New onset afib  Cardiology consulted regarding suspected afib in patient admitted with COVID-19 and concurrent HF exacerbation. Upon careful review of ECGs and telemetry, patient actually appears to be in a sinus rhythm with second degree type I AV block with low amplitude P waves. No significant afib burden appreciated.  Rates are well controlled at this time Heparin likely not needed as  patient does not appear to be in afib. Will discuss with Dr. Harrell Gave. If in afib, CHA2DS2-VASc Score = 3  Repeat ECG ordered  Acute on chronic HFpEF  Patient with increased weight along with exertional dyspnea just prior to this admission. Of note, patient reporting decreased urine output despite home Lasix compliance and there is concern of renal transplant failure. BNP elevated to 1704. Last echo from 2022 showed LVEF 60-65% and RV overload.   On physical exam, patient does have evidence of volume overload with elevated JVP. Given AKI and transplant hx, will defer to nephrology to manage diuresis Agree with repeating echocardiogram No ability to add GDMT such as ACEi/ARB/ARNI/MRA due to AKI  Mild Aortic stenosis  Patient with mild aortic stenosis per 2022 TTE. Will assess on repeat TTE today though lower suspicion that  patient has had significant progression.   Per primary team  COVID-19 DM type II ESRD   Risk Assessment/Risk Scores:        New York Heart Association (NYHA) Functional Class NYHA Class III  CHA2DS2-VASc Score = 3   This indicates a 3.2% annual risk of stroke. The patient's score is based upon: CHF History: 1 HTN History: 0 Diabetes History: 1 Stroke History: 0 Vascular Disease History: 0 Age Score: 1 Gender Score: 0         For questions or updates, please contact Naomi Please consult [email or web address] for contact info under    Signed, Lily Kocher, PA-C  01/15/2023 10:19 AM

## 2023-01-15 NOTE — Consult Note (Addendum)
Renal Service Consult Note [facility]  Russell Bowers 01/15/2023 Russell Blazing, MD Requesting Physician: Dr. Tana Coast  Reason for Consult: Renal failure HPI: The patient is a 72 y.o. year-old w/ PMH as below who presented to ED 1/23 w/ several weeks of persistent cough, fatigue and also SOB. In ED found to be hypoxic at 78% on RA. 3L [place] --> 91%. Hx renal transplant b/l creat 4.0.  Pt admitted and started on IV steroids for +covid infection. Got 1 dose IV lasix last night and voided 200 cc. Creat up at 5.2. We are asked to see for renal failure.   Pt is f/b Dr Carolin Sicks at [facility].  Has renal Tx in 2012, renal function has been declining the past couple of years now. Pt has been sick for 1 month he says, coughing and coughing. Not eating well. No confusion, +fatigue.   ROS - denies CP, no joint pain, no HA, no blurry vision, no rash, no diarrhea, no nausea/ vomiting, no dysuria, no difficulty voiding   Past Medical History  Past Medical History:  Diagnosis Date   ADHD (attention deficit hyperactivity disorder)    Allergic rhinitis    Cancer ([place])    Prostate cancer   Cellulitis and abscess of trunk 2014   history of back abscess   Cellulitis and abscess of unspecified site 2014   lower back   Detached retina    Diabetes mellitus without complication (HCC)    Elevated troponin    Fistula    OF LEFT ARM   Gout    H/O kidney transplant    History of congestive heart failure    FOLLOWED BY DR. 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 Melvyn Novas   Hypertension    Mild concentric left ventricular hypertrophy (LVH)    AND AORTIC STENOSIS FOLLOWED IN THE PAST BY CARDIOLOGIST DR. Marlou Porch.   OSA treated with BiPAP    Personal history of colonic polyps    Prostate cancer ([place]) 11/2010   PROSTATECTOMY WITH UROLOGIST DR. DAVIS   Renal failure     Shortness of breath    Sleep disorder breathing    FOLLOWED BY PULMONOLOGIST DR. Elsworth Soho   Past Surgical History  Past Surgical History:  Procedure Laterality Date   CATARACT EXTRACTION, BILATERAL  11/2014   COLONOSCOPY  09/2014   KIDNEY TRANSPLANT  12/01/2011   PROSTATECTOMY     FOR PROSTATE CANCER   prostectomy  11/2010   RETINAL DETACHMENT SURGERY  06/2002   Family History  Family History  Problem Relation Age of Onset   Diabetes Mother    Hypertension Mother    Atrial fibrillation Mother    Alzheimer's disease Father    Heart Problems Sister    Heart disease Maternal Grandfather    Heart disease Maternal Grandmother    Social History  reports that he quit smoking about 29 years ago. His smoking use included cigarettes. He has a 21.00 pack-year smoking history. He has never used smokeless tobacco. He reports current alcohol use of about 1.0 - 2.0 standard drink of alcohol per week. He reports that he does not use drugs. Allergies  Allergies  Allergen Reactions   Azathioprine Other (See Comments)   Pollen Extract Other (See Comments)    stuffy nose   Home medications Prior to Admission medications   Medication Sig Start Date End Date Taking?  Authorizing Provider  allopurinol (ZYLOPRIM) 100 MG tablet Take 100 mg by mouth daily. 07/30/22  Yes [provider]  calcitRIOL (ROCALTROL) 0.25 MCG capsule Take 0.25 mcg by mouth daily. 11/08/20  Yes [provider]  cinacalcet (SENSIPAR) 30 MG tablet Take 30 mg by mouth every other day.   Yes [provider]  docusate sodium (COLACE) 100 MG capsule Take 100 mg by mouth daily.   Yes [provider]  famotidine (PEPCID) 20 MG tablet Take 20 mg by mouth daily.   Yes [provider]  folic acid (FOLVITE) 1 MG tablet TAKE 2 TABLETS BY MOUTH EVERY DAY 02/12/22  Yes Ennever, Rudell Cobb, MD  furosemide (LASIX) 40 MG tablet Take 1 tablet (40 mg total) by mouth daily. 02/22/21  Yes Johnson, Clanford L, MD   glipiZIDE (GLUCOTROL) 5 MG tablet Take 5 mg by mouth daily before breakfast. 3 per day, 2 qam and 1 pm daily   Yes [provider]  insulin glargine (LANTUS) 100 UNIT/ML injection Inject 0.1 mLs (10 Units total) into the skin at bedtime. Patient taking differently: Inject 18-20 Units into the skin at bedtime. 02/21/21  Yes Johnson, Clanford L, MD  loperamide (IMODIUM) 2 MG capsule Take 2 mg by mouth as needed for diarrhea or loose stools.   Yes [provider]  loratadine (CLARITIN) 10 MG tablet Take 10 mg by mouth daily.   Yes [provider]  Magnesium Cl-Calcium Carbonate (SLOW-MAG PO) Take 1 tablet by mouth daily.    Yes [provider]  metoprolol succinate (TOPROL-XL) 50 MG 24 hr tablet Take 50 mg by mouth daily.   Yes [provider]  pravastatin (PRAVACHOL) 10 MG tablet Take 10 mg by mouth at bedtime.    Yes [provider]  tacrolimus (PROGRAF) 0.5 MG capsule Take 1 mg by mouth See admin instructions. 2 qam and 3 qpm   Yes [provider]  vitamin B-12 (CYANOCOBALAMIN) 500 MCG tablet Take 1,000 mcg by mouth at bedtime.   Yes [provider]  B-D ULTRAFINE III SHORT PEN 31G X 8 MM MISC Inject into the skin daily. 04/11/21   [provider]  glipiZIDE (GLUCOTROL XL) 5 MG 24 hr tablet Take 1 tablet (5 mg total) by mouth daily with breakfast. Patient not taking: Reported on 04/25/2022 02/21/21   Murlean Iba, MD  [facility] ULTRA test strip 3 (three) times daily. 02/26/21   [provider]     Vitals:   01/15/23 1000 01/15/23 1100 01/15/23 1200 01/15/23 1300  BP: (!) 85/44 (!) 93/57 126/75 132/68  Pulse: 77 86 95 (!) 102  Resp:   (!) 23 20  Temp:      TempSrc:      SpO2: 98% 93% 95% (!) 83%  Weight:      Height:       Exam Gen alert, no distress No rash, cyanosis or gangrene Sclera anicteric, throat clear  No jvd or bruits Chest bibasilar rales, no wheezing RRR no MRG Abd soft ntnd no mass  or ascites +bs GU normal male MS no joint effusions or deformity Ext mild bilat pedal edema, trace pretib edema, no other edema Neuro is alert, Ox 3 , nf    LUA AVF +bruit      Home meds include - allopurinol, sensipar 30 qd, rocaltrol 0.25 mcg qd, pepcid, lasix 40 qd, glipizide, insulin glargine, toprol xl 50, pravachol, prograf '2mg'$  ,      Date   Creat  eGFR    2014   2.1- 2.7    2015   1.5- 2.1    Feb- aug 2022 3.0- 3.5 18- 21    Sept 2022  3.65  17 ml/min    Sept 2023  4.09  15 ml/min    01/14/23  5.37      01/15/23   5.27   11 ml/min    K 6.0  bun 76  creat 5.27  CO2 24  AG 11 alb 3.3  wbc 7K Hb 16 >> 13   SP IV lasix '80mg'$  x 1 yesterday   UOP 200 cc yest   CXR 1/23 - IMPRESSION: 1. Reticulonodular opacities bilaterally, which may reflect atypical/viral infection.   CR chest noncont - Lungs/Pleura: Central airways are patent. Bibasilar linear opacities similar to prior exam and likely due to scarring. Small bilateral pleural effusions    UCr 134, UNa pending    UA - prot 100, many bact, 0-5 rbc, >50 wbc    Renal US transplant - normal [place], no hydro, nonspecific elevated intrarenal resistive indices.    Assessment/ Plan: AKI on CKD 5T - b/l creat 4.1 from sept 2023, eGFR 15 ml/min. Creat here 5.2 on admission in the setting of 1 month coughing, poor intake, found to have COVID infection. BP's are low, CT w/o edema, CXR w/o edema. UA +wbcs, renal US unremarkable. Mild pedal edema on exam. Viral pna is likely given hypoxia. AKI may be intravasc vol depletion (^^Hb/ alb, clear lungs on CT, gen exam) vs progression of CKD vs vol overload (conflicting findings).  Will try cautious IVF's first. No sig uremic findings so does not need RRT yet but is at risk for needing RRT this admission. Have d/w pt and wife.  Renal transplant - done in 2012, slowly failing, was CKD V in sept 2023. He is only on prograf at this time.  Covid infection - w/ cough x 1 month, IV steroids started BP - bp's  soft here, as above AHRF/ covid infection - suspected related to COVID, started on steroids Atrial fib - cardiology consulting DM2 per pmd      Kelly Splinter  MD 01/15/2023, 2:21 PM Recent Labs  Lab 01/14/23 1249 01/14/23 1319 01/15/23 0042  HGB 14.8 16.3 13.9  ALBUMIN 4.0  --  3.3*  CALCIUM 9.8  --  8.5*  PHOS  --   --  5.9*  CREATININE 5.37*  --  5.27*  K 4.9 5.0 6.0*   Inpatient medications:  allopurinol  100 mg Oral Daily   calcitRIOL  0.25 mcg Oral Daily   Chlorhexidine Gluconate Cloth  6 each Topical Q0600   cinacalcet  30 mg Oral Q48H   docusate sodium  100 mg Oral BID   famotidine  20 mg Oral Daily   folic acid  2 mg Oral Daily   insulin aspart  0-15 Units Subcutaneous TID WC   insulin aspart  0-5 Units Subcutaneous QHS   insulin glargine-yfgn  18 Units Subcutaneous QHS   loratadine  10 mg Oral Daily   methylPREDNISolone (SOLU-MEDROL) injection  0.5 mg/kg Intravenous Q12H   Followed by   Derrill Memo ON 01/18/2023] predniSONE  50 mg Oral Daily   mouth rinse  15 mL Mouth Rinse 4 times per day   pravastatin  10 mg Oral QHS   sodium chloride flush  3 mL Intravenous Q12H   tacrolimus  2 mg Oral Daily   tacrolimus  3 mg Oral QHS    sodium  chloride Stopped (01/14/23 1909)   sodium chloride     azithromycin (ZITHROMAX) 500 mg in sodium chloride 0.9 % 250 mL IVPB     cefTRIAXone (ROCEPHIN)  IV 2 g (01/15/23 1316)   heparin 1,250 Units/hr (01/15/23 1148)   sodium chloride, sodium chloride, acetaminophen **OR** acetaminophen, chlorpheniramine-HYDROcodone, guaiFENesin-dextromethorphan, HYDROcodone-acetaminophen, ipratropium, mouth rinse, polyethylene glycol, sodium chloride flush

## 2023-01-15 NOTE — Assessment & Plan Note (Signed)
Appreciate nephrology consult suspect progression of renal transplant failure. Obtain electrolytes renal transplant ultrasound in the morning Discussed with urology who is comfortable for patient to stay in at [facility] at this time will help [place] and continue to consult while patient is admitted

## 2023-01-15 NOTE — Assessment & Plan Note (Addendum)
Patient has been symptomatic for few weeks.  Now presents with hypoxia and pulmonary infiltrates.  Has history of renal transplant on immune suppressant medications which is likely why patient has developed COVID-pneumonia. Will obtain COVID serotype Treat with Solu-Medrol Patient has been symptomatic for over 5 days and BiPAP stepdown/ICU hold off on antivirals for now If progresses may benefit from pulmonology consult Check CRP sed rate LDH D-dimer Not a candidate for CTA but CT noncontrasted was somewhat concerning for dilated pulmonary artery could be evidence of pulmonary hypertension But could not rule out PE Dopplers lower extremity negative. Started on heparin given new diagnosis is of A-fib but also should cover in case there is a underlying PE Obtain echogram Check procalcitonin For tonight continue coverage for Rocephin azithromycin

## 2023-01-15 NOTE — Assessment & Plan Note (Signed)
this patient has acute respiratory failure with Hypoxia and  Hypercarbia as documented by the presence of following: O2 saturatio< 90% on RA  5 with pCO2 >50   Likely due to:   Pneumonia,   COVID pneumonia Provide O2 therapy and titrate as needed  Continuous pulse ox   check Pulse ox with ambulation prior to discharge   may need  TC consult for home O2 set up   Prone if able  flutter valve ordered  Changed biPAP setting will repeat VBG in 1 h Pt is tolerating well

## 2023-01-15 NOTE — Progress Notes (Signed)
[facility]                                                                              Russell Bowers, is a 72 y.o. male, DOB - Oct 30, 1951, IHW:[id number] Admit date - 01/14/2023    Outpatient Primary MD for the patient is [name], MD  LOS - 1  days  Chief Complaint  Patient presents with   Fatigue   Cough       Brief summary   Patient is a 72year old male with DM2, OSA on BiPAP, diastolic CHF, Renal transplant due to ESRD secondary to IgA nephropathy s/p DDKT on 12/01/2011, recurent UTI, history of prostate cancer s/p radical prostatectomy in 2011 presented with shortness of breath, cough and SOB for the past 1 week. No fever no chills. Uses BIpap for sleep at baseline  No history of PE/DVT.   COVID +  CT chest showed small bilateral pleural effusions and small volume ascites    Assessment & Plan    Principal Problem:   Acute respiratory failure with hypoxia and hypercapnia (HCC) - presented with acute respiratory failure with hypoxia and hypercarbia.  pCO2> 50, sO2 < 90%, VBG pH 7.2, pCO2 77.7, pO2 19, likely due to COVID-pneumonia, has underlying history of OSA on BiPAP, diastolic CHF with exacerbation.  BNP 1704 -Currently on BiPAP, alert and oriented, will obtain ABG -Patient is on BiPAP at night for OSA  Active Problems:   Pneumonia due to COVID-19 virus -Has been symptomatic for few weeks, presented with hypoxia and pulmonary infiltrates.  On immunosuppressants due to history of renal transplant. -Continue steroids, symptoms for a week, will hold off on antivirals for now -If worsening, will place on IV remdesivir if candidate  -Continue supportive treatment, BiPAP, steroids, flutter valve -Not a candidate for CTA due to renal dysfunction, currently on IV heparin -Continue IV Zithromax, Rocephin    Acute on chronic heart failure with preserved ejection fraction (HFpEF) ([place]), mild AR, AS -BNP 1704 on admission, received Lasix 80 mg IV in  the ER -Creatinine 5.27, requested cardiology consult.  On Lasix 40 mg daily outpatient. -2D echo in 01/2021 had shown EF of 60 to 65%, will repeat 2D echo  Atrial fibrillation: New diagnosis -Per patient, no prior history of atrial fibrillation -Heart rate controlled, BP currently borderline, hold off on Toprol -Continue IV heparin, cardiology consulted   Sepsis secondary to urinary tract infection -Met sepsis criteria on admission with tachypnea, tachycardia, respiratory failure, AKI, source likely UTI, COVID-pneumonia  -has history of frequent UTIs, continue IV Rocephin, follow urine culture and sensitivities  Acute kidney injury, superimposed on CKD4, history of renal transplantation -Creatinine 4.09 on 08/25/2022, presented with creatinine of 5.37, has been trending up, ?  transplant failure. -Nephrology has been consulted, will follow recommendations.    DM2 (diabetes mellitus, type 2) ([place]), uncontrolled with hyperglycemia, IDDM -Increase Semglee to 18 units at bedtime, NovoLog increased to moderate SSI qAC  and HS - HbA1C 7.7    OSA (obstructive sleep apnea) - continue BiPAP    Anemia due to chronic kidney disease -H&H currently stable, at baseline   Estimated  body mass index is 27.02 kg/m as calculated from the following:   Height as of this encounter: '5\' 9"'$  (1.753 m).   Weight as of this encounter: 83 kg.  Code Status: full  DVT Prophylaxis:  heparin gtt   Level of Care: Level of care: Stepdown Family Communication: Updated patient's wife at the bed side  Disposition Plan:      Remains inpatient appropriate:  still on bipap    Procedures:  None   Consultants:   Cardiology Nephrology  Antimicrobials:   Anti-infectives (From admission, onward)    Start     Dose/Rate Route Frequency Ordered Stop   01/15/23 1300  azithromycin (ZITHROMAX) 500 mg in sodium chloride 0.9 % 250 mL IVPB        500 mg 250 mL/hr over 60 Minutes Intravenous Every 24 hours 01/15/23  0037     01/15/23 1300  cefTRIAXone (ROCEPHIN) 2 g in sodium chloride 0.9 % 100 mL IVPB        2 g 200 mL/hr over 30 Minutes Intravenous Every 24 hours 01/15/23 0037     01/14/23 1315  cefTRIAXone (ROCEPHIN) 2 g in sodium chloride 0.9 % 100 mL IVPB        2 g 200 mL/hr over 30 Minutes Intravenous  Once 01/14/23 1306 01/14/23 1525   01/14/23 1315  azithromycin (ZITHROMAX) 500 mg in sodium chloride 0.9 % 250 mL IVPB        500 mg 250 mL/hr over 60 Minutes Intravenous  Once 01/14/23 1306 01/14/23 1908          Medications  allopurinol  100 mg Oral Daily   calcitRIOL  0.25 mcg Oral Daily   Chlorhexidine Gluconate Cloth  6 each Topical Q0600   cinacalcet  30 mg Oral Q48H   famotidine  20 mg Oral Daily   folic acid  2 mg Oral Daily   insulin aspart  0-9 Units Subcutaneous Q4H   insulin glargine-yfgn  15 Units Subcutaneous QHS   loratadine  10 mg Oral Daily   methylPREDNISolone (SOLU-MEDROL) injection  0.5 mg/kg Intravenous Q12H   Followed by   Derrill Memo ON 01/18/2023] predniSONE  50 mg Oral Daily   mouth rinse  15 mL Mouth Rinse 4 times per day   pravastatin  10 mg Oral QHS   sodium chloride flush  3 mL Intravenous Q12H   tacrolimus  2 mg Oral Daily   tacrolimus  3 mg Oral QHS      Subjective:   Russell Bowers was seen and examined today.  Still on BiPAP however now alert and oriented, feels much better.  Wife at the bedside.  Overnight no acute issues.  Afebrile,.  No chest pain, abdominal pain, nausea or vomiting.    Objective:   Vitals:   01/15/23 0400 01/15/23 0500 01/15/23 0600 01/15/23 0700  BP: 103/72 (!) 104/46 (!) 111/51 (!) 98/58  Pulse: 81 91 (!) 109 82  Resp: '14 17 10 16  '$ Temp:      TempSrc:      SpO2: 95% 95% 98% 97%  Weight:      Height:        Intake/Output Summary (Last 24 hours) at 01/15/2023 0749 Last data filed at 01/15/2023 0646 Gross per 24 hour  Intake 372.41 ml  Output 200 ml  Net 172.41 ml     Wt Readings from Last 3 Encounters:   01/14/23 83 kg  04/25/22 81.7 kg  07/26/21 79.4 kg     Exam General:  Alert and oriented x 3, NAD, still on Bipap Cardiovascular: S1 S2 auscultated,  RRR Respiratory: Diminished breath sound at the bases, no wheezing Gastrointestinal: Soft, nontender, nondistended, + bowel sounds Ext: no pedal edema bilaterally Neuro: Strength 5/5 upper and lower extremities bilaterally Skin: No rashes Psych: Normal affect and demeanor, alert and oriented x3     Data Reviewed:  I have personally reviewed following labs    CBC Lab Results  Component Value Date   WBC 7.2 01/15/2023   RBC 4.50 01/15/2023   HGB 13.9 01/15/2023   HCT 48.1 01/15/2023   MCV 106.9 (H) 01/15/2023   MCH 30.9 01/15/2023   PLT 136 (L) 01/15/2023   MCHC 28.9 (L) 01/15/2023   RDW 16.2 (H) 01/15/2023   LYMPHSABS 0.4 (L) 01/15/2023   MONOABS 0.1 01/15/2023   EOSABS 0.0 01/15/2023   BASOSABS 0.0 53/97/6734     Last metabolic panel Lab Results  Component Value Date   NA 135 01/15/2023   K 6.0 (H) 01/15/2023   CL 100 01/15/2023   CO2 24 01/15/2023   BUN 76 (H) 01/15/2023   CREATININE 5.27 (H) 01/15/2023   GLUCOSE 182 (H) 01/15/2023   GFRNONAA 11 (L) 01/15/2023   GFRAA 39 (L) 08/10/2014   CALCIUM 8.5 (L) 01/15/2023   PHOS 5.9 (H) 01/15/2023   PROT 6.9 01/15/2023   ALBUMIN 3.3 (L) 01/15/2023   BILITOT 0.7 01/15/2023   ALKPHOS 69 01/15/2023   AST 12 (L) 01/15/2023   ALT 14 01/15/2023   ANIONGAP 11 01/15/2023    CBG (last 3)  Recent Labs    01/14/23 2145 01/15/23 0430  GLUCAP 161* 163*      Coagulation Profile: Recent Labs  Lab 01/15/23 0112  INR 1.2     Radiology Studies: I have personally reviewed the imaging studies  US Venous Img Lower Unilateral Right  Result Date: 01/14/2023 CLINICAL DATA:  Pain and swelling EXAM: Right LOWER EXTREMITY VENOUS DOPPLER ULTRASOUND TECHNIQUE: Gray-scale sonography with compression, as well as color and duplex ultrasound, were performed to evaluate the  deep venous system(s) from the level of the common femoral vein through the popliteal and proximal calf veins. COMPARISON:  None Available. FINDINGS: VENOUS Normal compressibility of the common femoral, superficial femoral, and popliteal veins, as well as the visualized calf veins. Visualized portions of profunda femoral vein and great saphenous vein unremarkable. No filling defects to suggest DVT on grayscale or color Doppler imaging. Doppler waveforms show normal direction of venous flow, normal respiratory plasticity and response to augmentation. Limited views of the contralateral common femoral vein are unremarkable. OTHER None. Limitations: none IMPRESSION: Negative. Electronically Signed   By: Donavan Foil M.D.   On: 01/14/2023 15:27   CT Chest Wo Contrast  Result Date: 01/14/2023 CLINICAL DATA:  Hypoxia and CHF EXAM: CT CHEST WITHOUT CONTRAST TECHNIQUE: Multidetector CT imaging of the chest was performed following the standard protocol without IV contrast. RADIATION DOSE REDUCTION: This exam was performed according to the departmental dose-optimization program which includes automated exposure control, adjustment of the mA and/or kV according to patient size and/or use of iterative reconstruction technique. COMPARISON:  Chest CT dated March 21, 2014 FINDINGS: Cardiovascular: Cardiomegaly. Pericardial effusion. Normal caliber thoracic aorta with moderate calcified plaque. Severe left main and three-vessel coronary artery calcifications. Aortic valve calcifications. Dilated main and distal pulmonary arteries, main pulmonary artery measures up to 4.0 cm. Mediastinum/Nodes: Esophagus and thyroid are unremarkable. No pathologically enlarged lymph nodes seen in the chest. Lungs/Pleura: Central airways are patent.  Bibasilar linear opacities similar to prior exam and likely due to scarring. Small bilateral pleural effusions. Solid pulmonary nodule of the posterior left upper lobe measuring 5 mm on series 4,  image 28, not definitely present on prior exam although differences in slice thickness limit evaluation Upper Abdomen: Gallstones. Small volume ascites of the partially visualized abdomen. Musculoskeletal: No chest wall mass or suspicious bone lesions identified. IMPRESSION: 1. Small bilateral pleural effusions and small volume ascites of the partially visualized abdomen. 2. Dilated main and distal pulmonary arteries, findings can be seen in the setting of pulmonary hypertension. 3. Solid pulmonary nodule of the posterior left upper lobe measuring 5 mm. No follow-up needed if patient is low-risk.This recommendation follows the consensus statement: Guidelines for Management of Incidental Pulmonary Nodules Detected on CT Images: From the Fleischner Society 2017; Radiology 2017; 284:228-243. 4. Cardiomegaly and aortic Atherosclerosis (ICD10-I70.0). Electronically Signed   By: Yetta Glassman M.D.   On: 01/14/2023 13:35   DG Chest Portable 1 View  Result Date: 01/14/2023 CLINICAL DATA:  Hypoxia EXAM: PORTABLE CHEST 1 VIEW COMPARISON:  02/19/2021 FINDINGS: The heart size and mediastinal contours are within normal limits. Low lung volumes with bibasilar atelectasis. Blunting of the bilateral costophrenic angles for which trace pleural effusions are not excluded. Reticulonodular opacities bilaterally. No pneumothorax. The visualized skeletal structures are unremarkable. IMPRESSION: 1. Reticulonodular opacities bilaterally, which may reflect atypical/viral infection. 2. Low lung volumes with bibasilar atelectasis. Electronically Signed   By: Davina Poke D.O.   On: 01/14/2023 12:41       Chi Woodham M.D. [facility] 01/15/2023, 7:49 AM  Available via Epic secure chat 7am-7pm After 7 pm, please refer to night coverage provider listed on amion.

## 2023-01-16 ENCOUNTER — Inpatient Hospital Stay (HOSPITAL_COMMUNITY): Payer: Medicare Other

## 2023-01-16 DIAGNOSIS — N390 Urinary tract infection, site not specified: Secondary | ICD-10-CM | POA: Diagnosis not present

## 2023-01-16 DIAGNOSIS — N179 Acute kidney failure, unspecified: Secondary | ICD-10-CM | POA: Diagnosis not present

## 2023-01-16 DIAGNOSIS — J9602 Acute respiratory failure with hypercapnia: Secondary | ICD-10-CM | POA: Diagnosis not present

## 2023-01-16 DIAGNOSIS — J9601 Acute respiratory failure with hypoxia: Secondary | ICD-10-CM | POA: Diagnosis not present

## 2023-01-16 DIAGNOSIS — U071 COVID-19: Secondary | ICD-10-CM | POA: Diagnosis not present

## 2023-01-16 LAB — CBC WITH DIFFERENTIAL/PLATELET
Abs Immature Granulocytes: 0.02 10*3/uL (ref 0.00–0.07)
Basophils Absolute: 0 10*3/uL (ref 0.0–0.1)
Basophils Relative: 0 %
Eosinophils Absolute: 0 10*3/uL (ref 0.0–0.5)
Eosinophils Relative: 0 %
HCT: 41.9 % (ref 39.0–52.0)
Hemoglobin: 12.3 g/dL — ABNORMAL LOW (ref 13.0–17.0)
Immature Granulocytes: 1 %
Lymphocytes Relative: 8 %
Lymphs Abs: 0.3 10*3/uL — ABNORMAL LOW (ref 0.7–4.0)
MCH: 31.2 pg (ref 26.0–34.0)
MCHC: 29.4 g/dL — ABNORMAL LOW (ref 30.0–36.0)
MCV: 106.3 fL — ABNORMAL HIGH (ref 80.0–100.0)
Monocytes Absolute: 0.1 10*3/uL (ref 0.1–1.0)
Monocytes Relative: 2 %
Neutro Abs: 3.1 10*3/uL (ref 1.7–7.7)
Neutrophils Relative %: 89 %
Platelets: 123 10*3/uL — ABNORMAL LOW (ref 150–400)
RBC: 3.94 MIL/uL — ABNORMAL LOW (ref 4.22–5.81)
RDW: 16.1 % — ABNORMAL HIGH (ref 11.5–15.5)
WBC: 3.5 10*3/uL — ABNORMAL LOW (ref 4.0–10.5)
nRBC: 0.9 % — ABNORMAL HIGH (ref 0.0–0.2)

## 2023-01-16 LAB — URINE CULTURE: Culture: NO GROWTH

## 2023-01-16 LAB — D-DIMER, QUANTITATIVE: D-Dimer, Quant: 0.55 ug/mL-FEU — ABNORMAL HIGH (ref 0.00–0.50)

## 2023-01-16 LAB — GLUCOSE, CAPILLARY
Glucose-Capillary: 299 mg/dL — ABNORMAL HIGH (ref 70–99)
Glucose-Capillary: 349 mg/dL — ABNORMAL HIGH (ref 70–99)
Glucose-Capillary: 281 mg/dL — ABNORMAL HIGH (ref 70–99)
Glucose-Capillary: 329 mg/dL — ABNORMAL HIGH (ref 70–99)
Glucose-Capillary: 291 mg/dL — ABNORMAL HIGH (ref 70–99)
Glucose-Capillary: 241 mg/dL — ABNORMAL HIGH (ref 70–99)

## 2023-01-16 LAB — COMPREHENSIVE METABOLIC PANEL
ALT: 12 U/L (ref 0–44)
AST: 10 U/L — ABNORMAL LOW (ref 15–41)
Albumin: 3.1 g/dL — ABNORMAL LOW (ref 3.5–5.0)
Alkaline Phosphatase: 65 U/L (ref 38–126)
Anion gap: 11 (ref 5–15)
BUN: 112 mg/dL — ABNORMAL HIGH (ref 8–23)
CO2: 22 mmol/L (ref 22–32)
Calcium: 8.1 mg/dL — ABNORMAL LOW (ref 8.9–10.3)
Chloride: 103 mmol/L (ref 98–111)
Creatinine, Ser: 5.92 mg/dL — ABNORMAL HIGH (ref 0.61–1.24)
GFR, Estimated: 10 mL/min — ABNORMAL LOW (ref >60)
Glucose, Bld: 323 mg/dL — ABNORMAL HIGH (ref 70–99)
Potassium: 6.3 mmol/L (ref 3.5–5.1)
Sodium: 136 mmol/L (ref 135–145)
Total Bilirubin: 0.4 mg/dL (ref 0.3–1.2)
Total Protein: 5.9 g/dL — ABNORMAL LOW (ref 6.5–8.1)

## 2023-01-16 LAB — PROCALCITONIN: Procalcitonin: 0.25 ng/mL

## 2023-01-16 LAB — BASIC METABOLIC PANEL
Anion gap: 16 — ABNORMAL HIGH (ref 5–15)
BUN: 103 mg/dL — ABNORMAL HIGH (ref 8–23)
CO2: 22 mmol/L (ref 22–32)
Calcium: 8.4 mg/dL — ABNORMAL LOW (ref 8.9–10.3)
Chloride: 99 mmol/L (ref 98–111)
Creatinine, Ser: 6.34 mg/dL — ABNORMAL HIGH (ref 0.61–1.24)
GFR, Estimated: 9 mL/min — ABNORMAL LOW (ref >60)
Glucose, Bld: 316 mg/dL — ABNORMAL HIGH (ref 70–99)
Potassium: 5.3 mmol/L — ABNORMAL HIGH (ref 3.5–5.1)
Sodium: 137 mmol/L (ref 135–145)

## 2023-01-16 LAB — LEGIONELLA PNEUMOPHILA SEROGP 1 UR AG: L. pneumophila Serogp 1 Ur Ag: NEGATIVE

## 2023-01-16 LAB — PHOSPHORUS: Phosphorus: 6.5 mg/dL — ABNORMAL HIGH (ref 2.5–4.6)

## 2023-01-16 LAB — C-REACTIVE PROTEIN: CRP: 0.6 mg/dL (ref <1.0)

## 2023-01-16 LAB — MAGNESIUM: Magnesium: 2.9 mg/dL — ABNORMAL HIGH (ref 1.7–2.4)

## 2023-01-16 LAB — SAR COV2 SEROLOGY (COVID19)AB(IGG),IA: SARS-CoV-2 Ab, IgG: REACTIVE — AB

## 2023-01-16 MED ORDER — CALCIUM GLUCONATE-NACL 2-0.675 GM/100ML-% IV SOLN
2.0000 g | Freq: Once | INTRAVENOUS | Status: DC
  Filled 2023-01-16: qty 100

## 2023-01-16 MED ORDER — INSULIN ASPART 100 UNIT/ML IV SOLN: 10.0000 [IU] | Freq: Once | INTRAVENOUS | Status: DC

## 2023-01-16 MED ORDER — FUROSEMIDE 10 MG/ML IJ SOLN
80.0000 mg | Freq: Once | INTRAMUSCULAR | Status: AC
  Administered 2023-01-16: 80 mg via INTRAVENOUS
  Filled 2023-01-16: qty 8

## 2023-01-16 MED ORDER — SODIUM BICARBONATE 8.4 % IV SOLN
50.0000 meq | Freq: Once | INTRAVENOUS | Status: AC
  Administered 2023-01-16: 50 meq via INTRAVENOUS
  Filled 2023-01-16: qty 50

## 2023-01-16 MED ORDER — INSULIN GLARGINE-YFGN 100 UNIT/ML ~~LOC~~ SOLN
24.0000 [IU] | Freq: Every day | SUBCUTANEOUS | Status: DC
  Administered 2023-01-16: 24 [IU] via SUBCUTANEOUS
  Filled 2023-01-16 (×2): qty 0.24

## 2023-01-16 MED ORDER — CALCIUM GLUCONATE-NACL 1-0.675 GM/50ML-% IV SOLN: 1.0000 g | Freq: Once | INTRAVENOUS | Status: DC

## 2023-01-16 MED ORDER — DEXTROSE 50 % IV SOLN
25.0000 mL | Freq: Once | INTRAVENOUS | Status: AC
  Administered 2023-01-16: 25 mL via INTRAVENOUS
  Filled 2023-01-16: qty 50

## 2023-01-16 MED ORDER — ALBUMIN HUMAN 25 % IV SOLN
25.0000 g | Freq: Two times a day (BID) | INTRAVENOUS | Status: DC
  Administered 2023-01-16 – 2023-01-17 (×3): 25 g via INTRAVENOUS
  Filled 2023-01-16 (×3): qty 100

## 2023-01-16 MED ORDER — SODIUM ZIRCONIUM CYCLOSILICATE 10 G PO PACK
10.0000 g | PACK | Freq: Once | ORAL | Status: AC
  Administered 2023-01-16: 10 g via ORAL
  Filled 2023-01-16: qty 1

## 2023-01-16 MED ORDER — FUROSEMIDE 10 MG/ML IJ SOLN
80.0000 mg | Freq: Two times a day (BID) | INTRAMUSCULAR | Status: DC
  Administered 2023-01-16 – 2023-01-18 (×5): 80 mg via INTRAVENOUS
  Filled 2023-01-16 (×5): qty 8

## 2023-01-16 MED ORDER — INSULIN ASPART 100 UNIT/ML IJ SOLN
5.0000 [IU] | Freq: Once | INTRAMUSCULAR | Status: AC
  Administered 2023-01-16: 5 [IU] via INTRAVENOUS

## 2023-01-16 MED ORDER — CALCIUM GLUCONATE-NACL 2-0.675 GM/100ML-% IV SOLN
2.0000 g | INTRAVENOUS | Status: AC
  Administered 2023-01-16: 2000 mg via INTRAVENOUS
  Filled 2023-01-16: qty 100

## 2023-01-16 MED ORDER — DEXTROSE 50 % IV SOLN: 1.0000 | Freq: Once | INTRAVENOUS | Status: DC

## 2023-01-16 MED ORDER — SODIUM ZIRCONIUM CYCLOSILICATE 10 G PO PACK
10.0000 g | PACK | Freq: Three times a day (TID) | ORAL | Status: DC
  Administered 2023-01-16 – 2023-01-18 (×7): 10 g via ORAL
  Filled 2023-01-16 (×7): qty 1

## 2023-01-16 MED ORDER — INSULIN ASPART 100 UNIT/ML IJ SOLN
4.0000 [IU] | Freq: Three times a day (TID) | INTRAMUSCULAR | Status: DC
  Administered 2023-01-16 – 2023-01-17 (×3): 4 [IU] via SUBCUTANEOUS

## 2023-01-16 NOTE — Progress Notes (Signed)
[facility] Progress Note  Subjective: denies any confusion, nausea. UOP 300 cc yesterday.   Vitals:   01/16/23 0500 01/16/23 0600 01/16/23 0700 01/16/23 0800  BP: 98/72 137/63 120/61 (!) 111/53  Pulse: 69 61 81 91  Resp: (!) '26 20 15 '$ (!) 25  Temp:    98 F (36.7 C)  TempSrc:    Oral  SpO2: 97% 92% 92% 91%  Weight:      Height:        Exam: Gen alert, no distress +JVD to angle of jaw Chest dec'd L > R base, mild rales L > R RRR no MRG Abd soft ntnd no mass or ascites +bs Ext mild bilat pedal edema, no other edema Neuro is alert, Ox 3 , nf, no asterixis    LUA AVF +bruit     Home meds include - allopurinol, sensipar 30 qd, rocaltrol 0.25 mcg qd, pepcid, lasix 40 qd, glipizide, insulin glargine, toprol xl 50, pravachol, prograf '2mg'$  ,       Date                          Creat               eGFR    2014                         2.1- 2.7    2015                         1.5- 2.1    Feb- aug 2022         3.0- 3.5            18- 21    Sept 2022                 3.65                 17 ml/min    Sept 2023                 4.09                 15 ml/min    01/14/23                     5.37                     01/15/23                     5.27                 11 ml/min     K 6.0  bun 76  creat 5.27  CO2 24  AG 11 alb 3.3  wbc 7K Hb 16 >> 13   SP IV lasix '80mg'$  x 1 yesterday   UOP 200 cc yest   CXR 1/23 - IMPRESSION: 1. Reticulonodular opacities bilaterally, which may reflect atypical/viral infection.   CR chest noncont - Lungs/Pleura: Central airways are patent. Bibasilar linear opacities similar to prior exam and likely due to scarring. Small bilateral pleural effusions    UCr 134, UNa pending    UA - prot 100, many bact, 0-5 rbc, >50 wbc    Renal US transplant - normal [place], no hydro, nonspecific elevated intrarenal resistive indices.      Assessment/ Plan: AKI  on CKD 5T - b/l creat 4.1 from sept 2023, eGFR 15 ml/min. Creat here 5.2 on admission in the setting of 1  month coughing, poor intake, found to have COVID infection. BP's are low, CT w/o edema, CXR w/o edema. UA +wbcs, renal US unremarkable. Mild pedal edema on exam. Viral pna is likely given hypoxia. AKI may be intravasc vol depletion (^^Hb/ alb, clear lungs on CT, gen exam) vs progression of CKD vs vol overload.  Tried cautious IVF's overnight w/ no improvement in B/Cr, which are worse today so IVF's dc'd. Repeat CXR reading favors CHF. Pt not uremic yet but K+ is up and may need RRT soon. Have asked CCM to see for help w/ volume status and if needs HD cath. Have d/w pt and wife.  Hyperkalemia - K+ 6.3, got IV Ca and lokelma ordered 10 gm tid. Will get f/u level this afternoon.  Renal transplant - done in 2012, w/ function slowly failing. He was CKD stage 5 in sept 2023. He is only on prograf at this time.  BP - bp's soft here, as above AHRF/ covid infection - suspected related to COVID and/or CHF, started on steroids. ECHO shows normal LVEF.  Atrial fib - cardiology consulting DM2 per pmd     Rob Somerset 01/16/2023, 10:47 AM   Recent Labs  Lab 01/15/23 0042 01/16/23 0322  HGB 13.9 12.3*  ALBUMIN 3.3* 3.1*  CALCIUM 8.5* 8.1*  PHOS 5.9* 6.5*  CREATININE 5.27* 5.92*  K 6.0* 6.3*   Recent Labs  Lab 01/15/23 0031  FERRITIN 367*   Inpatient medications:  allopurinol  100 mg Oral Daily   calcitRIOL  0.25 mcg Oral Daily   Chlorhexidine Gluconate Cloth  6 each Topical Q0600   cinacalcet  30 mg Oral Q48H   insulin aspart  10 Units Intravenous Once   And   dextrose  1 ampule Intravenous Once   docusate sodium  100 mg Oral BID   famotidine  20 mg Oral Daily   folic acid  2 mg Oral Daily   heparin injection (subcutaneous)  5,000 Units Subcutaneous Q8H   insulin aspart  0-15 Units Subcutaneous TID WC   insulin aspart  0-5 Units Subcutaneous QHS   insulin aspart  4 Units Subcutaneous TID WC   insulin glargine-yfgn  24 Units Subcutaneous QHS   loratadine  10 mg Oral Daily    methylPREDNISolone (SOLU-MEDROL) injection  0.5 mg/kg Intravenous Q12H   Followed by   Derrill Memo ON 01/18/2023] predniSONE  50 mg Oral Daily   mouth rinse  15 mL Mouth Rinse 4 times per day   pravastatin  10 mg Oral QHS   sodium chloride flush  3 mL Intravenous Q12H   sodium zirconium cyclosilicate  10 g Oral TID   tacrolimus  2 mg Oral Daily   tacrolimus  3 mg Oral QHS    sodium chloride Stopped (01/14/23 1909)   sodium chloride     azithromycin (ZITHROMAX) 500 mg in sodium chloride 0.9 % 250 mL IVPB Stopped (01/15/23 1729)   calcium gluconate     cefTRIAXone (ROCEPHIN)  IV Stopped (01/15/23 1346)   sodium chloride, sodium chloride, acetaminophen **OR** acetaminophen, chlorpheniramine-HYDROcodone, guaiFENesin-dextromethorphan, HYDROcodone-acetaminophen, ipratropium, mouth rinse, polyethylene glycol, sodium chloride flush

## 2023-01-16 NOTE — Progress Notes (Addendum)
[facility]                                                                              Russell Bowers, is a 72 y.o. male, DOB - [birth year], PZW:[id number] Admit date - 01/14/2023    Outpatient Primary MD for the patient is Russell Cruel, MD  LOS - 2  days  Chief Complaint  Patient presents with   Fatigue   Cough       Brief summary   Patient is a 72year old male with DM2, OSA on BiPAP, diastolic CHF, Renal transplant due to ESRD secondary to IgA nephropathy s/p DDKT on 12/01/2011, recurent UTI, history of prostate cancer s/p radical prostatectomy in 2011 presented with shortness of breath, cough and SOB for the past 1 week. No fever no chills. Uses BIpap for sleep at baseline  No history of PE/DVT.   COVID +  CT chest showed small bilateral pleural effusions and small volume ascites    Assessment & Plan    Principal Problem:   Acute respiratory failure with hypoxia and hypercapnia (HCC) - presented with acute respiratory failure with hypoxia and hypercarbia.  pCO2> 50, sO2 < 90%, VBG pH 7.2, pCO2 77.7, pO2 19, likely due to COVID-pneumonia, has underlying history of OSA on BiPAP, diastolic CHF with exacerbation.  BNP 1704 -Has been weaned off of BiPAP during the day, still needs BiPAP qhs for OSA -O2 sats improving, 91- 96% on 5 L  Active Problems:   Pneumonia due to COVID-19 virus -Has been symptomatic for few weeks, presented with hypoxia and pulmonary infiltrates.  On immunosuppressants due to history of renal transplant. -Continue steroids, symptoms for a week, hold off on antivirals for now -If worsening, will place on IV remdesivir if candidate  -Continue supportive treatment, BiPAP, steroids, flutter valve -Continue IV Zithromax, Rocephin    Acute on chronic heart failure with preserved ejection fraction (HFpEF) ([place]), mild AR, AS -BNP 1704 on admission, received Lasix 80 mg IV in the ER, on Lasix 40 mg daily outpatient. -2D echo showed EF of  60 to 65%, indeterminate diastolic parameters, moderately reduced RV SF, mild to moderate AS -Cardiology consulted, deferred to nephrology for diuresis given severe renal dysfunction  Acute kidney injury, superimposed on CKD5, history of renal transplantation -Creatinine 4.09 on 08/25/2022, presented with creatinine of 5.37, has been trending up, ?  transplant failure.   - transplant renal US: No hydronephrosis, nonspecific elevated intrarenal resistive indicis -Creatinine continues to trend up, 5.92 today, with hyperkalemia, 6.3 - Tried cautious IVF with no improvement in renal function so IVF was DC'd, now placed on IV Lasix.  - CXR today shows cardiomegaly with small bilateral pleural effusions consistent with pulm edema/volume overload - d/w Dr. Elissa Hefty, nephrology, not uremic yet however hyperkalemia, volume overload and may need HD soon.    Hyperkalemia - K 6.3, was treated with calcium gluconate, D50/insulin, started on Lokelma 10 g 3 times daily -Repeat bmet  Severe sepsis secondary to urinary tract infection -Met sepsis criteria on admission with tachypnea, tachycardia, respiratory failure, AKI, source likely UTI, COVID-pneumonia  -has history of frequent UTIs, currently on IV  Rocephin, urine culture showed no growth     DM2 (diabetes mellitus, type 2) ([place]), uncontrolled with hyperglycemia, IDDM - HbA1C 7.7 CBG (last 3)  Recent Labs    01/16/23 0519 01/16/23 0542 01/16/23 0810  GLUCAP 299* 349* 281*  -Increase to Semglee to 24 units at bedtime, added meal coverage 4 units 3 times daily AC, continue moderate SSI     OSA (obstructive sleep apnea) - continue BiPAP qhs    Anemia due to chronic kidney disease -H&H currently stable, at baseline   Suspected atrial fibrillation -Per patient, no prior history of atrial fibrillation -Cardiology was consulted, upon review of EKG and telemetry appears to be in NSR with second-degree type I AV block/Wenckebach and NOT A-fib. -No  indication for anticoagulation   Estimated body mass index is 28.75 kg/m as calculated from the following:   Height as of this encounter: '5\' 9"'$  (1.753 m).   Weight as of this encounter: 88.3 kg.  Code Status: full  DVT Prophylaxis:  heparin injection 5,000 Units Start: 01/15/23 2200heparin gtt   Level of Care: Level of care: Progressive Family Communication: Updated patient's wife at the bed side on 1/24.  Patient alert and oriented x 4 Disposition Plan:      Remains inpatient appropriate: Transfer to [facility] for possible HD  Procedures:  None   Consultants:   Cardiology Nephrology CCM  Antimicrobials:   Anti-infectives (From admission, onward)    Start     Dose/Rate Route Frequency Ordered Stop   01/15/23 1300  azithromycin (ZITHROMAX) 500 mg in sodium chloride 0.9 % 250 mL IVPB        500 mg 250 mL/hr over 60 Minutes Intravenous Every 24 hours 01/15/23 0037     01/15/23 1300  cefTRIAXone (ROCEPHIN) 2 g in sodium chloride 0.9 % 100 mL IVPB        2 g 200 mL/hr over 30 Minutes Intravenous Every 24 hours 01/15/23 0037     01/14/23 1315  cefTRIAXone (ROCEPHIN) 2 g in sodium chloride 0.9 % 100 mL IVPB        2 g 200 mL/hr over 30 Minutes Intravenous  Once 01/14/23 1306 01/14/23 1525   01/14/23 1315  azithromycin (ZITHROMAX) 500 mg in sodium chloride 0.9 % 250 mL IVPB        500 mg 250 mL/hr over 60 Minutes Intravenous  Once 01/14/23 1306 01/14/23 1908          Medications  allopurinol  100 mg Oral Daily   calcitRIOL  0.25 mcg Oral Daily   Chlorhexidine Gluconate Cloth  6 each Topical Q0600   cinacalcet  30 mg Oral Q48H   insulin aspart  10 Units Intravenous Once   And   dextrose  1 ampule Intravenous Once   docusate sodium  100 mg Oral BID   famotidine  20 mg Oral Daily   folic acid  2 mg Oral Daily   furosemide  80 mg Intravenous BID   heparin injection (subcutaneous)  5,000 Units Subcutaneous Q8H   insulin aspart  0-15 Units Subcutaneous TID WC   insulin  aspart  0-5 Units Subcutaneous QHS   insulin aspart  4 Units Subcutaneous TID WC   insulin glargine-yfgn  24 Units Subcutaneous QHS   loratadine  10 mg Oral Daily   methylPREDNISolone (SOLU-MEDROL) injection  0.5 mg/kg Intravenous Q12H   Followed by   Derrill Memo ON 01/18/2023] predniSONE  50 mg Oral Daily   mouth rinse  15 mL Mouth Rinse  4 times per day   pravastatin  10 mg Oral QHS   sodium chloride flush  3 mL Intravenous Q12H   sodium zirconium cyclosilicate  10 g Oral TID   tacrolimus  2 mg Oral Daily   tacrolimus  3 mg Oral QHS      Subjective:   Chazz Philson was seen and examined today.  Alert and oriented x4, off BiPAP.  Denies any specific complaints this morning.   No nausea vomiting, chest pain, fevers.  Objective:   Vitals:   01/16/23 0800 01/16/23 0900 01/16/23 1000 01/16/23 1100  BP: (!) 111/53 (!) 125/51 (!) 129/58 130/64  Pulse: 91 93 80 85  Resp: (!) 25 (!) '23 16 18  '$ Temp: 98 F (36.7 C)     TempSrc: Oral     SpO2: 91% 91% 96% 94%  Weight:      Height:        Intake/Output Summary (Last 24 hours) at 01/16/2023 1201 Last data filed at 01/16/2023 0853 Gross per 24 hour  Intake 2083.83 ml  Output 225 ml  Net 1858.83 ml     Wt Readings from Last 3 Encounters:  01/16/23 88.3 kg  04/25/22 81.7 kg  07/26/21 79.4 kg    Physical Exam General: Alert and oriented x 3, NAD HEENT: + JVD Cardiovascular: S1 S2 clear, RRR.  Respiratory: Bibasilar Rales Gastrointestinal: Soft, nontender, nondistended, NBS Ext: trace-1+ pedal edema bilaterally Neuro: no new deficits Skin: No rashes Psych: Normal affect     Data Reviewed:  I have personally reviewed following labs    CBC Lab Results  Component Value Date   WBC 3.5 (L) 01/16/2023   RBC 3.94 (L) 01/16/2023   HGB 12.3 (L) 01/16/2023   HCT 41.9 01/16/2023   MCV 106.3 (H) 01/16/2023   MCH 31.2 01/16/2023   PLT 123 (L) 01/16/2023   MCHC 29.4 (L) 01/16/2023   RDW 16.1 (H) 01/16/2023   LYMPHSABS 0.3  (L) 01/16/2023   MONOABS 0.1 01/16/2023   EOSABS 0.0 01/16/2023   BASOSABS 0.0 99/37/1696     Last metabolic panel Lab Results  Component Value Date   NA 136 01/16/2023   K 6.3 (HH) 01/16/2023   CL 103 01/16/2023   CO2 22 01/16/2023   BUN 112 (H) 01/16/2023   CREATININE 5.92 (H) 01/16/2023   GLUCOSE 323 (H) 01/16/2023   GFRNONAA 10 (L) 01/16/2023   GFRAA 39 (L) 08/10/2014   CALCIUM 8.1 (L) 01/16/2023   PHOS 6.5 (H) 01/16/2023   PROT 5.9 (L) 01/16/2023   ALBUMIN 3.1 (L) 01/16/2023   BILITOT 0.4 01/16/2023   ALKPHOS 65 01/16/2023   AST 10 (L) 01/16/2023   ALT 12 01/16/2023   ANIONGAP 11 01/16/2023    CBG (last 3)  Recent Labs    01/16/23 0519 01/16/23 0542 01/16/23 0810  GLUCAP 299* 349* 281*      Coagulation Profile: Recent Labs  Lab 01/15/23 0112  INR 1.2     Radiology Studies: I have personally reviewed the imaging studies  DG CHEST PORT 1 VIEW  Result Date: 01/16/2023 CLINICAL DATA:  Acute renal failure superimposed on chronic kidney disease EXAM: PORTABLE CHEST 1 VIEW COMPARISON:  Multiple exams, including 01/14/2023 FINDINGS: Low lung volumes are present, causing crowding of the pulmonary vasculature. There is a small amount of fluid in the minor fissure along with blunting of both costophrenic angles compatible with small bilateral pleural effusions. Indistinct pulmonary vasculature compatible with pulmonary venous hypertension. Cardiac borders partially obscured but I  anticipate that there is mild enlargement of the cardiopericardial silhouette. IMPRESSION: 1. Mild enlargement of the cardiopericardial silhouette with pulmonary venous hypertension and small bilateral pleural effusions. Findings favor congestive heart failure. 2. Low lung volumes. 3. No findings of pneumonia. Electronically Signed   By: Van Clines M.D.   On: 01/16/2023 10:06   ECHOCARDIOGRAM COMPLETE  Result Date: 01/15/2023    ECHOCARDIOGRAM REPORT   Patient Name:   Russell Bowers Date of Exam: 01/15/2023 Medical Rec #:  [id number]          Height:       69.0 in Accession #:    [id number]         Weight:       183.0 lb Date of Birth:  [birth year]           BSA:          1.989 m Patient Age:    59 years           BP:           132/68 mmHg Patient Gender: M                  HR:           91 bpm. Exam Location:  Inpatient Procedure: 2D Echo, Cardiac Doppler, Color Doppler and Intracardiac            Opacification Agent Indications:    Atrial fibrillation  History:        Patient has prior history of Echocardiogram examinations, most                 recent 02/18/2021. CHF, Aortic Valve Disease,                 Signs/Symptoms:Shortness of Breath; Risk Factors:Sleep Apnea,                 Hypertension and Diabetes. Cancer.  Sonographer:    Eartha Inch Referring Phys: [id number] ANASTASSIA DOUTOVA  Sonographer Comments: Technically difficult study due to poor echo windows. Image acquisition challenging due to patient body habitus and Image acquisition challenging due to respiratory motion. IMPRESSIONS  1. Left ventricular ejection fraction, by estimation, is 60 to 65%. The left ventricle has normal function. The left ventricle has no regional wall motion abnormalities. Left ventricular diastolic parameters are indeterminate.  2. Right ventricular systolic function is moderately reduced. The right ventricular size is normal.  3. Left atrial size was moderately dilated.  4. The mitral valve is abnormal. No evidence of mitral valve regurgitation. No evidence of mitral stenosis. Moderate mitral annular calcification.  5. Fucntionally bicuspid with fuse right and left cusps gradietns similar to those obtained 02/18/21 . The aortic valve is normal in structure. There is severe calcifcation of the aortic valve. There is severe thickening of the aortic valve. Aortic valve  regurgitation is trivial. Mild to moderate aortic valve stenosis.  6. The inferior vena cava is normal in size with greater than 50%  respiratory variability, suggesting right atrial pressure of 3 mmHg. FINDINGS  Left Ventricle: Left ventricular ejection fraction, by estimation, is 60 to 65%. The left ventricle has normal function. The left ventricle has no regional wall motion abnormalities. Definity contrast agent was given IV to delineate the left ventricular  endocardial borders. The left ventricular internal cavity size was normal in size. There is no left ventricular hypertrophy. Left ventricular diastolic parameters are indeterminate. Right Ventricle: The right ventricular size is normal. No increase  in right ventricular wall thickness. Right ventricular systolic function is moderately reduced. Left Atrium: Left atrial size was moderately dilated. Right Atrium: Right atrial size was normal in size. Pericardium: Trivial pericardial effusion is present. The pericardial effusion is posterior to the left ventricle. Mitral Valve: The mitral valve is abnormal. There is moderate thickening of the mitral valve leaflet(s). There is moderate calcification of the mitral valve leaflet(s). Moderate mitral annular calcification. No evidence of mitral valve regurgitation. No evidence of mitral valve stenosis. Tricuspid Valve: The tricuspid valve is normal in structure. Tricuspid valve regurgitation is mild . No evidence of tricuspid stenosis. Aortic Valve: Fucntionally bicuspid with fuse right and left cusps gradietns similar to those obtained 02/18/21. The aortic valve is normal in structure. There is severe calcifcation of the aortic valve. There is severe thickening of the aortic valve. Aortic valve regurgitation is trivial. Mild to moderate aortic stenosis is present. Aortic valve mean gradient measures 17.0 mmHg. Aortic valve peak gradient measures 27.9 mmHg. Aortic valve area, by VTI measures 1.64 cm. Pulmonic Valve: The pulmonic valve was normal in structure. Pulmonic valve regurgitation is not visualized. No evidence of pulmonic stenosis. Aorta:  The aortic root is normal in size and structure. Venous: The inferior vena cava is normal in size with greater than 50% respiratory variability, suggesting right atrial pressure of 3 mmHg. IAS/Shunts: No atrial level shunt detected by color flow Doppler.  LEFT VENTRICLE PLAX 2D LVIDd:         4.30 cm   Diastology LVIDs:         2.60 cm   LV e' medial:    3.81 cm/s LV PW:         0.80 cm   LV E/e' medial:  34.9 LV IVS:        1.10 cm   LV e' lateral:   7.15 cm/s LVOT diam:     2.20 cm   LV E/e' lateral: 18.6 LV SV:         70 LV SV Index:   35 LVOT Area:     3.80 cm  RIGHT VENTRICLE RV S prime:     11.50 cm/s TAPSE (M-mode): 1.4 cm LEFT ATRIUM             Index        RIGHT ATRIUM           Index LA diam:        4.90 cm 2.46 cm/m   RA Area:     23.10 cm LA Vol (A2C):   86.0 ml 43.23 ml/m  RA Volume:   73.60 ml  37.00 ml/m LA Vol (A4C):   52.1 ml 26.19 ml/m LA Biplane Vol: 70.3 ml 35.34 ml/m  AORTIC VALVE AV Area (Vmax):    1.50 cm AV Area (Vmean):   1.40 cm AV Area (VTI):     1.64 cm AV Vmax:           264.00 cm/s AV Vmean:          194.000 cm/s AV VTI:            0.428 m AV Peak Grad:      27.9 mmHg AV Mean Grad:      17.0 mmHg LVOT Vmax:         104.00 cm/s LVOT Vmean:        71.300 cm/s LVOT VTI:          0.185 m LVOT/AV VTI ratio: 0.43  AORTA Ao  Root diam: 3.50 cm Ao Asc diam:  3.80 cm MITRAL VALVE                TRICUSPID VALVE MV Area (PHT): 3.34 cm     TR Peak grad:   17.8 mmHg MV Decel Time: 227 msec     TR Vmax:        211.00 cm/s MV E velocity: 133.00 cm/s                             SHUNTS                             Systemic VTI:  0.18 m                             Systemic Diam: 2.20 cm Jenkins Rouge MD Electronically signed by Jenkins Rouge MD Signature Date/Time: 01/15/2023/4:02:46 PM    Final    US Renal Transplant w/Doppler  Result Date: 01/15/2023 CLINICAL DATA:  Acute kidney injury, post transplant EXAM: ULTRASOUND OF RENAL TRANSPLANT WITH RENAL DOPPLER ULTRASOUND TECHNIQUE: Ultrasound  examination of the renal transplant was performed with gray-scale, color and duplex doppler evaluation. COMPARISON:  08/25/2022 FINDINGS: Transplant kidney location: RLQ Transplant Kidney: Renal measurements: 8 x 3.8 x 4.8 cm = volume: 71m (previously 99). Normal in size and parenchymal echogenicity. No evidence of mass or hydronephrosis. No peri-transplant fluid collection seen. Color flow in the main renal artery:  Yes Color flow in the main renal vein:  Yes Duplex Doppler Evaluation: Main Renal Artery Velocity: 47 cm/sec Main Renal Artery Resistive Index: 0.93 Venous waveform in main renal vein:  Present Intrarenal resistive index in upper pole:  0.83 (normal 0.6-0.8; equivocal 0.8-0.9; abnormal >= 0.9) Intrarenal resistive index in lower pole: 0.87 (normal 0.6-0.8; equivocal 0.8-0.9; abnormal >= 0.9) Bladder: Normal for degree of bladder distention. No ureteral jet identified. Other findings:  None. IMPRESSION: 1. Nonspecific elevated intrarenal resistive indices. 2. No hydronephrosis. Electronically Signed   By: DLucrezia EuropeM.D.   On: 01/15/2023 08:07   [place] Img Lower Unilateral Right  Result Date: 01/14/2023 CLINICAL DATA:  Pain and swelling EXAM: Right LOWER EXTREMITY VENOUS DOPPLER ULTRASOUND TECHNIQUE: Gray-scale sonography with compression, as well as color and duplex ultrasound, were performed to evaluate the deep venous system(s) from the level of the common femoral vein through the popliteal and proximal calf veins. COMPARISON:  None Available. FINDINGS: VENOUS Normal compressibility of the common femoral, superficial femoral, and popliteal veins, as well as the visualized calf veins. Visualized portions of profunda femoral vein and great saphenous vein unremarkable. No filling defects to suggest DVT on grayscale or color Doppler imaging. Doppler waveforms show normal direction of venous flow, normal respiratory plasticity and response to augmentation. Limited views of the contralateral common  femoral vein are unremarkable. OTHER None. Limitations: none IMPRESSION: Negative. Electronically Signed   By: KDonavan FoilM.D.   On: 01/14/2023 15:27   CT Chest Wo Contrast  Result Date: 01/14/2023 CLINICAL DATA:  Hypoxia and CHF EXAM: CT CHEST WITHOUT CONTRAST TECHNIQUE: Multidetector CT imaging of the chest was performed following the standard protocol without IV contrast. RADIATION DOSE REDUCTION: This exam was performed according to the departmental dose-optimization program which includes automated exposure control, adjustment of the mA and/or kV according to patient size and/or use of iterative reconstruction technique. COMPARISON:  Chest CT dated March 21, 2014 FINDINGS: Cardiovascular: Cardiomegaly. Pericardial effusion. Normal caliber thoracic aorta with moderate calcified plaque. Severe left main and three-vessel coronary artery calcifications. Aortic valve calcifications. Dilated main and distal pulmonary arteries, main pulmonary artery measures up to 4.0 cm. Mediastinum/Nodes: Esophagus and thyroid are unremarkable. No pathologically enlarged lymph nodes seen in the chest. Lungs/Pleura: Central airways are patent. Bibasilar linear opacities similar to prior exam and likely due to scarring. Small bilateral pleural effusions. Solid pulmonary nodule of the posterior left upper lobe measuring 5 mm on series 4, image 28, not definitely present on prior exam although differences in slice thickness limit evaluation Upper Abdomen: Gallstones. Small volume ascites of the partially visualized abdomen. Musculoskeletal: No chest wall mass or suspicious bone lesions identified. IMPRESSION: 1. Small bilateral pleural effusions and small volume ascites of the partially visualized abdomen. 2. Dilated main and distal pulmonary arteries, findings can be seen in the setting of pulmonary hypertension. 3. Solid pulmonary nodule of the posterior left upper lobe measuring 5 mm. No follow-up needed if patient is  low-risk.This recommendation follows the consensus statement: Guidelines for Management of Incidental Pulmonary Nodules Detected on CT Images: From the Fleischner Society 2017; Radiology 2017; 284:228-243. 4. Cardiomegaly and aortic Atherosclerosis (ICD10-I70.0). Electronically Signed   By: Yetta Glassman M.D.   On: 01/14/2023 13:35   DG Chest Portable 1 View  Result Date: 01/14/2023 CLINICAL DATA:  Hypoxia EXAM: PORTABLE CHEST 1 VIEW COMPARISON:  02/19/2021 FINDINGS: The heart size and mediastinal contours are within normal limits. Low lung volumes with bibasilar atelectasis. Blunting of the bilateral costophrenic angles for which trace pleural effusions are not excluded. Reticulonodular opacities bilaterally. No pneumothorax. The visualized skeletal structures are unremarkable. IMPRESSION: 1. Reticulonodular opacities bilaterally, which may reflect atypical/viral infection. 2. Low lung volumes with bibasilar atelectasis. Electronically Signed   By: Davina Poke D.O.   On: 01/14/2023 12:41       Elianys Conry M.D. [facility] 01/16/2023, 12:01 PM  Available via Epic secure chat 7am-7pm After 7 pm, please refer to night coverage provider listed on amion.

## 2023-01-16 NOTE — Consult Note (Addendum)
NAME:  Russell Bowers, MRN:  [id number], DOB:  12/02/1951, LOS: 2 ADMISSION DATE:  01/14/2023, CONSULTATION DATE:  1/25 REFERRING MD:  Dr. Jonnie Finner  CHIEF COMPLAINT:  SOB, cough, fatigue   History of Present Illness:  72  y/o M with hx of IgA nephropathy, s/p DDRT renal transplant 4174 complicated by progressive CKD.  He presented to [facility] ER on 1/23 with reports of several weeks of increased fatigue, cough, anorexia and found to have hypoxic respiratory failure in the setting of COVID positive as well as AKI. Unfortunately, he has had progressive AKI. He was admitted per [facility] and treated with steroids for COVID. Glucose elevated in setting of DM and steroids.   PCCM consulted for assistance with management.    Patient reports decreased intake prior to admit.  No vomiting or diarrhea.  Reports some LE swelling prior to admit.  Denies fevers/sweats.  Notes his last HD was in 03/16/2011 after his transplant - required a couple of sessions prior to discharge. Has L FA AVF.   Pertinent  Medical History  CKD s/p deceased donor transplant in 2011-03-16  Prostate cancer in remission, s/p prostatectomy  OSA - followed by Dr. Elsworth Soho Chronic Hypercarbic Respiratory Failure  Emphysema  Tobacco Abuse  HFpEF DM  ADHD  Significant Hospital Events: Including procedures, antibiotic start and stop dates in addition to other pertinent events   1/23 Admit with SOB, fatigue. COVID Positive 1/25 PCCM consulted for evaluation / fluid status assessment  Interim History / Subjective:  Afebrile Pt reports feeling ok, better since admit.  Objective   Blood pressure (!) 111/53, pulse 91, temperature 98 F (36.7 C), temperature source Oral, resp. rate (!) 25, height '5\' 9"'$  (1.753 m), weight 88.3 kg, SpO2 91 %.    FiO2 (%):  [30 %] 30 %   Intake/Output Summary (Last 24 hours) at 01/16/2023 1041 Last data filed at 01/16/2023 0814 Gross per 24 hour  Intake 2443.83 ml  Output 225 ml  Net 03-15-2217 ml   Filed Weights    01/14/23 1221 01/16/23 0324 01/16/23 0327  Weight: 83 kg 88.3 kg 88.3 kg    Examination: General: adult male sitting up in bed, wife at bedside HENT: MM pink/moist, mild JVD, anicteric, wearing glasses Lungs: non-labored at rest, lungs bilaterally clear Cardiovascular: s1s2 RRR, no m/r/g Abdomen: soft, non-labored at rest, clear Extremities: warm/dry, L FA AVF with +thrill/bruit Neuro: AAOx4, speech clear, MAE   Resolved Hospital Problem list      Assessment & Plan:   COVID Positive  Fatigue, Cough -per TRH  -continue steroids  -O2 if needed to support sats >90% -empiric ceftriaxone for CAP -H2 blocker -cough suppression -follow intermittent CXR -pulmonary hygiene - IS, mobilize  AKI superimposed on DDRT Hyperkalemia, Hyperphosphatemia, Hypermagnesemia Transplant in 03-16-2011, deceased donor  -euvolemic to slightly volume up on exam  -albumin + lasix  -appreciate Nephrology  -Trend BMP / urinary output -Replace electrolytes as indicated -Avoid nephrotoxic agents, ensure adequate renal perfusion   PCCM will be available PRN.  Please call back if new needs arise.    Best Practice (right click and "Reselect all SmartList Selections" daily)  Per TR[facility]   Labs   CBC: Recent Labs  Lab 01/14/23 1249 01/14/23 1319 01/15/23 0042 01/16/23 0322  WBC 5.9  --  7.2 3.5*  NEUTROABS 5.0  --  6.7 3.1  HGB 14.8 16.3 13.9 12.3*  HCT 48.7 48.0 48.1 41.9  MCV 105.0*  --  106.9* 106.3*  PLT 138*  --  136* 123*    Basic Metabolic Panel: Recent Labs  Lab 01/14/23 1249 01/14/23 1319 01/15/23 0042 01/16/23 0322  NA 136 137 135 136  K 4.9 5.0 6.0* 6.3*  CL 100  --  100 103  CO2 27  --  24 22  GLUCOSE 192*  --  182* 323*  BUN 81*  --  76* 112*  CREATININE 5.37*  --  5.27* 5.92*  CALCIUM 9.8  --  8.5* 8.1*  MG  --   --  2.8* 2.9*  PHOS  --   --  5.9* 6.5*   GFR: Estimated Creatinine Clearance: 12.6 mL/min (A) (by C-G formula based on SCr of 5.92 mg/dL (H)). Recent Labs   Lab 01/14/23 1249 01/14/23 1420 01/15/23 0042 01/16/23 0322  PROCALCITON  --   --  0.16 0.25  WBC 5.9  --  7.2 3.5*  LATICACIDVEN  --  0.7  --   --     Liver Function Tests: Recent Labs  Lab 01/14/23 1249 01/15/23 0042 01/16/23 0322  AST 11* 12* 10*  ALT '12 14 12  '$ ALKPHOS 74 69 65  BILITOT 0.7 0.7 0.4  PROT 7.4 6.9 5.9*  ALBUMIN 4.0 3.3* 3.1*   No results for input(s): "LIPASE", "AMYLASE" in the last 168 hours. No results for input(s): "AMMONIA" in the last 168 hours.  ABG    Component Value Date/Time   PHART 7.295 (L) 03/20/2014 1829   PCO2ART 95.5 (HH) 03/20/2014 1829   PO2ART 67.0 (L) 03/20/2014 1829   HCO3 21.1 01/15/2023 1025   TCO2 33 (H) 01/14/2023 1319   ACIDBASEDEF 5.9 (H) 01/15/2023 1025   O2SAT 76.5 01/15/2023 1025     Coagulation Profile: Recent Labs  Lab 01/15/23 0112  INR 1.2    Cardiac Enzymes: Recent Labs  Lab 01/15/23 0042  CKTOTAL 24*    HbA1C: Hgb A1c MFr Bld  Date/Time Value Ref Range Status  01/15/2023 12:42 AM 7.7 (H) 4.8 - 5.6 % Final    Comment:    (NOTE) Pre diabetes:          5.7%-6.4%  Diabetes:              >6.4%  Glycemic control for   <7.0% adults with diabetes   02/18/2021 01:22 AM 7.4 (H) 4.8 - 5.6 % Final    Comment:    (NOTE) Pre diabetes:          5.7%-6.4%  Diabetes:              >6.4%  Glycemic control for   <7.0% adults with diabetes     CBG: Recent Labs  Lab 01/15/23 1836 01/15/23 2140 01/16/23 0519 01/16/23 0542 01/16/23 0810  GLUCAP 223* 263* 299* 349* 281*    Review of Systems:  Positives in [place]   Gen: Denies fever, chills, weight change, fatigue, night sweats HEENT: Denies blurred vision, double vision, hearing loss, tinnitus, sinus congestion, rhinorrhea, sore throat, neck stiffness, dysphagia PULM: Denies shortness of breath, cough, sputum production, hemoptysis, wheezing CV: Denies chest pain, edema, orthopnea, paroxysmal nocturnal dyspnea, palpitations GI: Denies abdominal  pain, nausea, vomiting, diarrhea, hematochezia, melena, constipation, change in bowel habits GU: Denies dysuria, hematuria, polyuria, oliguria, urethral discharge Endocrine: Denies hot or cold intolerance, polyuria, polyphagia or appetite change Derm: Denies rash, dry skin, scaling or peeling skin change Heme: Denies easy bruising, bleeding, bleeding gums Neuro: Denies headache, numbness, weakness, slurred speech, loss of memory or consciousness  Past Medical History:  He,  has a  past medical history of ADHD (attention deficit hyperactivity disorder), Allergic rhinitis, Cancer ([place]), Cellulitis and abscess of trunk (2014), Cellulitis and abscess of unspecified site (2014), Detached retina, Diabetes mellitus without complication ([place]), Elevated troponin, Fistula, Gout, H/O kidney transplant, History of congestive heart failure, History of recurrent pneumonia, Hypercarbia, Hypertension, Mild concentric left ventricular hypertrophy (LVH), OSA treated with BiPAP, Personal history of colonic polyps, Prostate cancer ([place]) (11/2010), Renal failure, Shortness of breath, and Sleep disorder breathing.   Surgical History:   Past Surgical History:  Procedure Laterality Date   CATARACT EXTRACTION, BILATERAL  11/2014   COLONOSCOPY  09/2014   KIDNEY TRANSPLANT  12/01/2011   PROSTATECTOMY     FOR PROSTATE CANCER   prostectomy  11/2010   RETINAL DETACHMENT SURGERY  06/2002     Social History:   reports that he quit smoking about 29 years ago. His smoking use included cigarettes. He has a 21.00 pack-year smoking history. He has never used smokeless tobacco. He reports current alcohol use of about 1.0 - 2.0 standard drink of alcohol per week. He reports that he does not use drugs.   Family History:  His family history includes Alzheimer's disease in his father; Atrial fibrillation in his mother; Diabetes in his mother; Heart Problems in his sister; Heart disease in his maternal grandfather and maternal  grandmother; Hypertension in his mother.   Allergies Allergies  Allergen Reactions   Azathioprine Other (See Comments)   Pollen Extract Other (See Comments)    stuffy nose     Home Medications  Prior to Admission medications   Medication Sig Start Date End Date Taking? Authorizing Provider  allopurinol (ZYLOPRIM) 100 MG tablet Take 100 mg by mouth daily. 07/30/22  Yes [provider]  calcitRIOL (ROCALTROL) 0.25 MCG capsule Take 0.25 mcg by mouth daily. 11/08/20  Yes [provider]  cinacalcet (SENSIPAR) 30 MG tablet Take 30 mg by mouth every other day.   Yes [provider]  docusate sodium (COLACE) 100 MG capsule Take 100 mg by mouth daily.   Yes [provider]  famotidine (PEPCID) 20 MG tablet Take 20 mg by mouth daily.   Yes [provider]  folic acid (FOLVITE) 1 MG tablet TAKE 2 TABLETS BY MOUTH EVERY DAY 02/12/22  Yes Ennever, Rudell Cobb, MD  furosemide (LASIX) 40 MG tablet Take 1 tablet (40 mg total) by mouth daily. 02/22/21  Yes Johnson, Clanford L, MD  glipiZIDE (GLUCOTROL) 5 MG tablet Take 5 mg by mouth daily before breakfast. 3 per day, 2 qam and 1 pm daily   Yes [provider]  insulin glargine (LANTUS) 100 UNIT/ML injection Inject 0.1 mLs (10 Units total) into the skin at bedtime. Patient taking differently: Inject 18-20 Units into the skin at bedtime. 02/21/21  Yes Johnson, Clanford L, MD  loperamide (IMODIUM) 2 MG capsule Take 2 mg by mouth as needed for diarrhea or loose stools.   Yes [provider]  loratadine (CLARITIN) 10 MG tablet Take 10 mg by mouth daily.   Yes [provider]  Magnesium Cl-Calcium Carbonate (SLOW-MAG PO) Take 1 tablet by mouth daily.    Yes [provider]  metoprolol succinate (TOPROL-XL) 50 MG 24 hr tablet Take 50 mg by mouth daily.   Yes [provider]  pravastatin (PRAVACHOL) 10 MG tablet Take 10 mg by mouth at bedtime.    Yes [provider]   tacrolimus (PROGRAF) 0.5 MG capsule Take 1 mg by mouth See admin instructions. 2 qam  and 3 qpm   Yes [provider]  vitamin B-12 (CYANOCOBALAMIN) 500 MCG tablet Take 1,000 mcg by mouth at bedtime.   Yes [provider]  B-D ULTRAFINE III SHORT PEN 31G X 8 MM MISC Inject into the skin daily. 04/11/21   [provider]  glipiZIDE (GLUCOTROL XL) 5 MG 24 hr tablet Take 1 tablet (5 mg total) by mouth daily with breakfast. Patient not taking: Reported on 04/25/2022 02/21/21   Murlean Iba, MD  [facility] ULTRA test strip 3 (three) times daily. 02/26/21   [provider]     Critical care time: n/a    Noe Gens, MSN, APRN, NP-C, AGACNP-BC [facility] 01/16/2023, 10:41 AM   Please see [email or web address] for pager details.   From 7A-7P if no response, please call [phone] After hours, please call ELink [phone]

## 2023-01-16 NOTE — Inpatient Diabetes Management (Signed)
Inpatient Diabetes Program Recommendations  AACE/ADA: New Consensus Statement on Inpatient Glycemic Control (2015)  Target Ranges:  Prepandial:   less than 140 mg/dL      Peak postprandial:   less than 180 mg/dL (1-2 hours)      Critically ill patients:  140 - 180 mg/dL   Lab Results  Component Value Date   GLUCAP 281 (H) 01/16/2023   HGBA1C 7.7 (H) 01/15/2023    Review of Glycemic Control  Latest Reference Range & Units 01/16/23 05:19 01/16/23 05:42 01/16/23 08:10  Glucose-Capillary 70 - 99 mg/dL 299 (H) 349 (H) 281 (H)  (H): Data is abnormally high Diabetes history: Type 2 DM Outpatient Diabetes medications: Glipizide 5 mg QD, Lantus 18-20 units QHS Current orders for Inpatient glycemic control: Semglee 18 units QHS, Novolog 0-15 units TID & HS Solumedrol 41.25 mg BID Decadron 6 mg x 1   Inpatient Diabetes Program Recommendations:     Consider: -Increasing Semglee 24 units QHS -Adding Novolog 4 units TID (assuming patient is consuming >50% of meals)  Thanks, Bronson Curb, MSN, RNC-OB Diabetes Coordinator [phone] (8a-5p)

## 2023-01-17 DIAGNOSIS — J9601 Acute respiratory failure with hypoxia: Secondary | ICD-10-CM | POA: Diagnosis not present

## 2023-01-17 DIAGNOSIS — N390 Urinary tract infection, site not specified: Secondary | ICD-10-CM | POA: Diagnosis not present

## 2023-01-17 DIAGNOSIS — N179 Acute kidney failure, unspecified: Secondary | ICD-10-CM | POA: Diagnosis not present

## 2023-01-17 DIAGNOSIS — U071 COVID-19: Secondary | ICD-10-CM | POA: Diagnosis not present

## 2023-01-17 LAB — GLUCOSE, CAPILLARY
Glucose-Capillary: 267 mg/dL — ABNORMAL HIGH (ref 70–99)
Glucose-Capillary: 240 mg/dL — ABNORMAL HIGH (ref 70–99)
Glucose-Capillary: 291 mg/dL — ABNORMAL HIGH (ref 70–99)
Glucose-Capillary: 305 mg/dL — ABNORMAL HIGH (ref 70–99)

## 2023-01-17 LAB — CBC WITH DIFFERENTIAL/PLATELET
Abs Immature Granulocytes: 0.11 10*3/uL — ABNORMAL HIGH (ref 0.00–0.07)
Abs Immature Granulocytes: 0.24 10*3/uL — ABNORMAL HIGH (ref 0.00–0.07)
Basophils Absolute: 0 10*3/uL (ref 0.0–0.1)
Basophils Absolute: 0 10*3/uL (ref 0.0–0.1)
Basophils Relative: 0 %
Basophils Relative: 0 %
Eosinophils Absolute: 0 10*3/uL (ref 0.0–0.5)
Eosinophils Absolute: 0 10*3/uL (ref 0.0–0.5)
Eosinophils Relative: 0 %
Eosinophils Relative: 0 %
HCT: 43.4 % (ref 39.0–52.0)
HCT: 45.5 % (ref 39.0–52.0)
Hemoglobin: 12.6 g/dL — ABNORMAL LOW (ref 13.0–17.0)
Hemoglobin: 13.3 g/dL (ref 13.0–17.0)
Immature Granulocytes: 2 %
Immature Granulocytes: 3 %
Lymphocytes Relative: 4 %
Lymphocytes Relative: 3 %
Lymphs Abs: 0.3 10*3/uL — ABNORMAL LOW (ref 0.7–4.0)
Lymphs Abs: 0.2 10*3/uL — ABNORMAL LOW (ref 0.7–4.0)
MCH: 31.4 pg (ref 26.0–34.0)
MCH: 31.6 pg (ref 26.0–34.0)
MCHC: 29 g/dL — ABNORMAL LOW (ref 30.0–36.0)
MCHC: 29.2 g/dL — ABNORMAL LOW (ref 30.0–36.0)
MCV: 108.2 fL — ABNORMAL HIGH (ref 80.0–100.0)
MCV: 108.1 fL — ABNORMAL HIGH (ref 80.0–100.0)
Monocytes Absolute: 0.2 10*3/uL (ref 0.1–1.0)
Monocytes Absolute: 0.4 10*3/uL (ref 0.1–1.0)
Monocytes Relative: 3 %
Monocytes Relative: 5 %
Neutro Abs: 6.5 10*3/uL (ref 1.7–7.7)
Neutro Abs: 7.7 10*3/uL (ref 1.7–7.7)
Neutrophils Relative %: 91 %
Neutrophils Relative %: 89 %
Platelets: 116 10*3/uL — ABNORMAL LOW (ref 150–400)
Platelets: 111 10*3/uL — ABNORMAL LOW (ref 150–400)
RBC: 4.01 MIL/uL — ABNORMAL LOW (ref 4.22–5.81)
RBC: 4.21 MIL/uL — ABNORMAL LOW (ref 4.22–5.81)
RDW: 16.2 % — ABNORMAL HIGH (ref 11.5–15.5)
RDW: 16.4 % — ABNORMAL HIGH (ref 11.5–15.5)
WBC: 7.1 10*3/uL (ref 4.0–10.5)
WBC: 8.7 10*3/uL (ref 4.0–10.5)
nRBC: 0.7 % — ABNORMAL HIGH (ref 0.0–0.2)
nRBC: 0.7 % — ABNORMAL HIGH (ref 0.0–0.2)

## 2023-01-17 LAB — COMPREHENSIVE METABOLIC PANEL
ALT: 13 U/L (ref 0–44)
AST: 10 U/L — ABNORMAL LOW (ref 15–41)
Albumin: 3.7 g/dL (ref 3.5–5.0)
Alkaline Phosphatase: 60 U/L (ref 38–126)
Anion gap: 11 (ref 5–15)
BUN: 109 mg/dL — ABNORMAL HIGH (ref 8–23)
CO2: 22 mmol/L (ref 22–32)
Calcium: 8.1 mg/dL — ABNORMAL LOW (ref 8.9–10.3)
Chloride: 102 mmol/L (ref 98–111)
Creatinine, Ser: 6.29 mg/dL — ABNORMAL HIGH (ref 0.61–1.24)
GFR, Estimated: 9 mL/min — ABNORMAL LOW (ref >60)
Glucose, Bld: 294 mg/dL — ABNORMAL HIGH (ref 70–99)
Potassium: 5.2 mmol/L — ABNORMAL HIGH (ref 3.5–5.1)
Sodium: 135 mmol/L (ref 135–145)
Total Bilirubin: 0.6 mg/dL (ref 0.3–1.2)
Total Protein: 6.4 g/dL — ABNORMAL LOW (ref 6.5–8.1)

## 2023-01-17 LAB — D-DIMER, QUANTITATIVE: D-Dimer, Quant: 0.77 ug/mL-FEU — ABNORMAL HIGH (ref 0.00–0.50)

## 2023-01-17 LAB — BLOOD GAS, VENOUS
Acid-base deficit: 5.1 mmol/L — ABNORMAL HIGH (ref 0.0–2.0)
Bicarbonate: 26.7 mmol/L (ref 20.0–28.0)
Drawn by: 164
O2 Saturation: 71.4 %
Patient temperature: 37
pCO2, Ven: 86 mmHg (ref 44–60)
pH, Ven: 7.1 — CL (ref 7.25–7.43)
pO2, Ven: 45 mmHg (ref 32–45)

## 2023-01-17 LAB — MAGNESIUM: Magnesium: 2.7 mg/dL — ABNORMAL HIGH (ref 1.7–2.4)

## 2023-01-17 LAB — C-REACTIVE PROTEIN: CRP: 0.5 mg/dL (ref <1.0)

## 2023-01-17 LAB — TACROLIMUS LEVEL: Tacrolimus (FK506) - LabCorp: 9.1 ng/mL (ref 2.0–20.0)

## 2023-01-17 LAB — PHOSPHORUS: Phosphorus: 7.6 mg/dL — ABNORMAL HIGH (ref 2.5–4.6)

## 2023-01-17 MED ORDER — INSULIN GLARGINE-YFGN 100 UNIT/ML ~~LOC~~ SOLN
30.0000 [IU] | Freq: Every day | SUBCUTANEOUS | Status: DC
  Administered 2023-01-17: 30 [IU] via SUBCUTANEOUS
  Filled 2023-01-17 (×3): qty 0.3

## 2023-01-17 MED ORDER — ORAL CARE MOUTH RINSE: 15.0000 mL | OROMUCOSAL | Status: DC | PRN

## 2023-01-17 MED ORDER — ORAL CARE MOUTH RINSE
15.0000 mL | OROMUCOSAL | Status: DC
  Administered 2023-01-17 – 2023-01-29 (×40): 15 mL via OROMUCOSAL

## 2023-01-17 MED ORDER — INSULIN ASPART 100 UNIT/ML IJ SOLN
8.0000 [IU] | Freq: Three times a day (TID) | INTRAMUSCULAR | Status: DC
  Administered 2023-01-17 – 2023-01-18 (×2): 8 [IU] via SUBCUTANEOUS

## 2023-01-17 MED ORDER — ALBUMIN HUMAN 25 % IV SOLN
25.0000 g | Freq: Two times a day (BID) | INTRAVENOUS | Status: DC
  Administered 2023-01-17 – 2023-01-18 (×2): 25 g via INTRAVENOUS
  Filled 2023-01-17 (×2): qty 100

## 2023-01-17 NOTE — Progress Notes (Signed)
PM Glucose check > 300.  Long acting insulin due at 10pm not given on time due to pharmacy delay.  PROGRAF also not given at 10pm as ordered due to pharmacy supply delay.

## 2023-01-17 NOTE — Progress Notes (Signed)
.   Transition of Care [facility]) Screening Note   Patient Details  Name: KIRKLIN MCDUFFEE Date of Birth: 1951-03-23   Transition of Care [facility]) CM/SW Contact:    Illene Regulus, LCSW Phone Number: 01/17/2023, 4:14 PM    Transition of Care Department [facility]) has reviewed patient and no TOC needs have been identified at this time. We will continue to monitor patient advancement through interdisciplinary progression rounds. If new patient transition needs arise, please place a TOC consult.

## 2023-01-17 NOTE — Progress Notes (Addendum)
TR[facility] night cross cover note:   I was notified by RN of the patient's wife's request to check blood gas.   Specifically, in this patient who is hospitalized with COVID-19 infection as well as renal failure, he has been experiencing some mild confusion, suspected to be metabolic in nature in the setting of acute infection as well as renal failure.  In the context of the patient's documented history of obstructive sleep apnea on nocturnal bipap, patient's wife would like to expand evaluation of his acute encephalopathy to include assessment for any contribution from uncompensated hypercapnia.  Consequently, I have ordered VBG for this purpose.     Babs Bertin, DO Hospitalist

## 2023-01-17 NOTE — Inpatient Diabetes Management (Signed)
Inpatient Diabetes Program Recommendations  AACE/ADA: New Consensus Statement on Inpatient Glycemic Control (2015)  Target Ranges:  Prepandial:   less than 140 mg/dL      Peak postprandial:   less than 180 mg/dL (1-2 hours)      Critically ill patients:  140 - 180 mg/dL   Lab Results  Component Value Date   GLUCAP 267 (H) 01/17/2023   HGBA1C 7.7 (H) 01/15/2023    Review of Glycemic Control  Latest Reference Range & Units 01/16/23 12:17 01/16/23 16:10 01/16/23 23:16 01/17/23 07:53  Glucose-Capillary 70 - 99 mg/dL 329 (H) 291 (H) 241 (H) 267 (H)  (H): Data is abnormally high Diabetes history: Type 2 DM Outpatient Diabetes medications: Glipizide 5 mg QD, Lantus 18-20 units QHS Current orders for Inpatient glycemic control: Semglee 24 units QHS, Novolog 0-15 units TID & HS, Novolog 4 units TID Solumedrol 41.25 mg BID Decadron 6 mg x 1   Inpatient Diabetes Program Recommendations:   If steroids to continue: Consider: -Increasing Semglee 30 units QHS -Increasing Novolog 10 units TID (assuming patient is consuming >50% of meals)    Thanks, Bronson Curb, MSN, RNC-OB Diabetes Coordinator [phone] (8a-5p)

## 2023-01-17 NOTE — Progress Notes (Signed)
Reported to [facility] RT for 3E that PT has transferred from [facility] to 3E23 and has order for BiPAP QHS and PRN.

## 2023-01-17 NOTE — Progress Notes (Addendum)
[facility]                                                                              Russell Bowers, is a 72 y.o. male, DOB - 30-Dec-1950, TOI:[id number] Admit date - 01/14/2023    Outpatient Primary MD for the patient is [name], MD  LOS - 3  days  Chief Complaint  Patient presents with   Fatigue   Cough       Brief summary   Patient is a 72year old male with DM2, OSA on BiPAP, diastolic CHF, Renal transplant due to ESRD secondary to IgA nephropathy s/p DDKT on 12/01/2011, recurent UTI, history of prostate cancer s/p radical prostatectomy in 2011 presented with shortness of breath, cough and SOB for the past 1 week. No fever no chills. Uses BIpap for sleep at baseline  No history of PE/DVT.   COVID +  CT chest showed small bilateral pleural effusions and small volume ascites    Assessment & Plan    Principal Problem:   Acute respiratory failure with hypoxia and hypercapnia (HCC) - presented with acute respiratory failure with hypoxia and hypercarbia.  VBG pH 7.2, pCO2 77.7, pO2 19, likely due to COVID-pneumonia, has underlying history of OSA on BiPAP, diastolic CHF with exacerbation.  BNP 1704 -Has been weaned off of BiPAP during the day, continue BiPAP qhs for OSA -O2 sats 97% on 8 L HFNC  Active Problems:   Pneumonia due to COVID-19 virus -Has been symptomatic for few weeks, presented with hypoxia and pulmonary infiltrates.  On immunosuppressants due to history of renal transplant. -Continue steroids, symptoms for a week, hold off on antivirals for now -Continue IV steroids, BiPAP, flutter valve, IV Zithromax, IV Rocephin      Acute on chronic heart failure with preserved ejection fraction (HFpEF) ([place]), mild AR, AS -BNP 1704 on admission, received Lasix 80 mg IV in the ER, on Lasix 40 mg daily outpatient. -2D echo showed EF of 60 to 65%, indeterminate diastolic parameters, moderately reduced RV SF, mild to moderate AS -Cardiology consulted,  deferred to nephrology for diuresis given severe renal dysfunction  Acute kidney injury, superimposed on CKD5, history of renal transplantation -Creatinine 4.09 on 08/25/2022, presented with creatinine of 5.37, has been trending up, ?  transplant failure.   - transplant renal US: No hydronephrosis, nonspecific elevated intrarenal resistive indicis - Tried cautious IVF with no improvement in renal function so IVF was DC'd, now placed on IV Lasix.  Nephrology following closely, CCM also consulted - d/w Dr. Jonnie Finner, not uremic yet however persistent hyperkalemia, volume overload and may need HD soon.   -Creatinine trended up to 6.34 on 1/25, today 6.2 with persistent hyperkalemia despite treatment   Hyperkalemia -Continue Lokelma scheduled  Severe sepsis secondary to COVID-19, pneumonia, ?urinary tract infection -Met sepsis criteria on admission with tachypnea, tachycardia, respiratory failure, AKI, source likely UTI, COVID-pneumonia  -has history of frequent UTIs, urine culture showed no growth     DM2 (diabetes mellitus, type 2) ([place]), uncontrolled with hyperglycemia, IDDM - HbA1C 7.7 CBG (last 3)  Recent Labs    01/16/23 2316 01/17/23 0753 01/17/23 1146  GLUCAP 241* 267* 240*   -CBGs elevated, increase to Semglee to 30 units at bedtime, increase meal coverage to 8 units 3 times daily AC, moderate SSI     OSA (obstructive sleep apnea) - continue BiPAP qhs    Anemia due to chronic kidney disease -H&H stable  Suspected atrial fibrillation -Per patient, no prior history of atrial fibrillation -Cardiology was consulted, upon review of EKG and telemetry appears to be in NSR with second-degree type I AV block/Wenckebach and NOT A-fib. -No indication for anticoagulation   Estimated body mass index is 29.33 kg/m as calculated from the following:   Height as of this encounter: '5\' 9"'$  (1.753 m).   Weight as of this encounter: 90.1 kg.  Code Status: full  DVT Prophylaxis:  heparin  injection 5,000 Units Start: 01/15/23 2200heparin gtt   Level of Care: Level of care: Progressive Family Communication: Updated patient's wife at the bedside in detail today.   Disposition Plan:      Remains inpatient appropriate: Transfer to [facility] pending for closer monitoring and possibly needing HD in near future (has AV fistula)  Procedures:  Bipap  Consultants:   Cardiology Nephrology CCM  Antimicrobials:   Anti-infectives (From admission, onward)    Start     Dose/Rate Route Frequency Ordered Stop   01/15/23 1300  azithromycin (ZITHROMAX) 500 mg in sodium chloride 0.9 % 250 mL IVPB        500 mg 250 mL/hr over 60 Minutes Intravenous Every 24 hours 01/15/23 0037     01/15/23 1300  cefTRIAXone (ROCEPHIN) 2 g in sodium chloride 0.9 % 100 mL IVPB        2 g 200 mL/hr over 30 Minutes Intravenous Every 24 hours 01/15/23 0037     01/14/23 1315  cefTRIAXone (ROCEPHIN) 2 g in sodium chloride 0.9 % 100 mL IVPB        2 g 200 mL/hr over 30 Minutes Intravenous  Once 01/14/23 1306 01/14/23 1525   01/14/23 1315  azithromycin (ZITHROMAX) 500 mg in sodium chloride 0.9 % 250 mL IVPB        500 mg 250 mL/hr over 60 Minutes Intravenous  Once 01/14/23 1306 01/14/23 1908          Medications  allopurinol  100 mg Oral Daily   calcitRIOL  0.25 mcg Oral Daily   Chlorhexidine Gluconate Cloth  6 each Topical Q0600   cinacalcet  30 mg Oral Q48H   insulin aspart  10 Units Intravenous Once   And   dextrose  1 ampule Intravenous Once   docusate sodium  100 mg Oral BID   famotidine  20 mg Oral Daily   folic acid  2 mg Oral Daily   furosemide  80 mg Intravenous BID   heparin injection (subcutaneous)  5,000 Units Subcutaneous Q8H   insulin aspart  0-15 Units Subcutaneous TID WC   insulin aspart  0-5 Units Subcutaneous QHS   insulin aspart  8 Units Subcutaneous TID WC   insulin glargine-yfgn  30 Units Subcutaneous QHS   loratadine  10 mg Oral Daily   methylPREDNISolone (SOLU-MEDROL)  injection  0.5 mg/kg Intravenous Q12H   Followed by   Derrill Memo ON 01/18/2023] predniSONE  50 mg Oral Daily   mouth rinse  15 mL Mouth Rinse 4 times per day   pravastatin  10 mg Oral QHS   sodium chloride flush  3 mL Intravenous Q12H   sodium zirconium cyclosilicate  10 g Oral TID   tacrolimus  2 mg Oral Daily   tacrolimus  3 mg Oral QHS      Subjective:   Russell Bowers was seen and examined today.  Seen earlier this morning, on BiPAP.  Wife at the bedside.   slept good last night.  No acute chest pain, fevers or chills, nausea or vomiting.  JVD+  Objective:   Vitals:   01/17/23 0900 01/17/23 1000 01/17/23 1030 01/17/23 1038  BP: (!) 136/54 (!) 138/58    Pulse: 93 97 86 90  Resp: (!) 21 (!) 27 (!) 25 20  Temp:      TempSrc:      SpO2: 94% (!) 87% (!) 86% 97%  Weight:      Height:        Intake/Output Summary (Last 24 hours) at 01/17/2023 1136 Last data filed at 01/17/2023 0843 Gross per 24 hour  Intake 653.86 ml  Output 225 ml  Net 428.86 ml     Wt Readings from Last 3 Encounters:  01/17/23 90.1 kg  04/25/22 81.7 kg  07/26/21 79.4 kg   Physical Exam General: Alert and oriented, on BiPAP HEENT: JVD+ Cardiovascular: S1 S2 clear, RRR.  Respiratory: Anteriorly CTA, difficult to assess with BiPAP Gastrointestinal: Soft, nontender, nondistended, NBS Ext: 1+  pedal edema bilaterally Neuro: no new deficits, left arm fistula Skin: No rashes Psych: Normal affect   Data Reviewed:  I have personally reviewed following labs    CBC Lab Results  Component Value Date   WBC 7.1 01/17/2023   RBC 4.01 (L) 01/17/2023   HGB 12.6 (L) 01/17/2023   HCT 43.4 01/17/2023   MCV 108.2 (H) 01/17/2023   MCH 31.4 01/17/2023   PLT 116 (L) 01/17/2023   MCHC 29.0 (L) 01/17/2023   RDW 16.2 (H) 01/17/2023   LYMPHSABS 0.3 (L) 01/17/2023   MONOABS 0.2 01/17/2023   EOSABS 0.0 01/17/2023   BASOSABS 0.0 07/37/1062     Last metabolic panel Lab Results  Component Value Date   NA  135 01/17/2023   K 5.2 (H) 01/17/2023   CL 102 01/17/2023   CO2 22 01/17/2023   BUN 109 (H) 01/17/2023   CREATININE 6.29 (H) 01/17/2023   GLUCOSE 294 (H) 01/17/2023   GFRNONAA 9 (L) 01/17/2023   GFRAA 39 (L) 08/10/2014   CALCIUM 8.1 (L) 01/17/2023   PHOS 7.6 (H) 01/17/2023   PROT 6.4 (L) 01/17/2023   ALBUMIN 3.7 01/17/2023   BILITOT 0.6 01/17/2023   ALKPHOS 60 01/17/2023   AST 10 (L) 01/17/2023   ALT 13 01/17/2023   ANIONGAP 11 01/17/2023    CBG (last 3)  Recent Labs    01/16/23 1610 01/16/23 2316 01/17/23 0753  GLUCAP 291* 241* 267*      Coagulation Profile: Recent Labs  Lab 01/15/23 0112  INR 1.2     Radiology Studies: I have personally reviewed the imaging studies  DG CHEST PORT 1 VIEW  Result Date: 01/16/2023 CLINICAL DATA:  Acute renal failure superimposed on chronic kidney disease EXAM: PORTABLE CHEST 1 VIEW COMPARISON:  Multiple exams, including 01/14/2023 FINDINGS: Low lung volumes are present, causing crowding of the pulmonary vasculature. There is a small amount of fluid in the minor fissure along with blunting of both costophrenic angles compatible with small bilateral pleural effusions. Indistinct pulmonary vasculature compatible with pulmonary venous hypertension. Cardiac borders partially obscured but I anticipate that there is mild enlargement of the cardiopericardial silhouette. IMPRESSION: 1. Mild enlargement of the cardiopericardial silhouette with pulmonary venous hypertension and small bilateral pleural  effusions. Findings favor congestive heart failure. 2. Low lung volumes. 3. No findings of pneumonia. Electronically Signed   By: Van Clines M.D.   On: 01/16/2023 10:06   ECHOCARDIOGRAM COMPLETE  Result Date: 01/15/2023    ECHOCARDIOGRAM REPORT   Patient Name:   Russell Bowers Date of Exam: 01/15/2023 Medical Rec #:  [id number]          Height:       69.0 in Accession #:    [id number]         Weight:       183.0 lb Date of Birth:  1951-02-03            BSA:          1.989 m Patient Age:    88 years           BP:           132/68 mmHg Patient Gender: M                  HR:           91 bpm. Exam Location:  Inpatient Procedure: 2D Echo, Cardiac Doppler, Color Doppler and Intracardiac            Opacification Agent Indications:    Atrial fibrillation  History:        Patient has prior history of Echocardiogram examinations, most                 recent 02/18/2021. CHF, Aortic Valve Disease,                 Signs/Symptoms:Shortness of Breath; Risk Factors:Sleep Apnea,                 Hypertension and Diabetes. Cancer.  Sonographer:    Eartha Inch Referring Phys: [id number] ANASTASSIA DOUTOVA  Sonographer Comments: Technically difficult study due to poor echo windows. Image acquisition challenging due to patient body habitus and Image acquisition challenging due to respiratory motion. IMPRESSIONS  1. Left ventricular ejection fraction, by estimation, is 60 to 65%. The left ventricle has normal function. The left ventricle has no regional wall motion abnormalities. Left ventricular diastolic parameters are indeterminate.  2. Right ventricular systolic function is moderately reduced. The right ventricular size is normal.  3. Left atrial size was moderately dilated.  4. The mitral valve is abnormal. No evidence of mitral valve regurgitation. No evidence of mitral stenosis. Moderate mitral annular calcification.  5. Fucntionally bicuspid with fuse right and left cusps gradietns similar to those obtained 02/18/21 . The aortic valve is normal in structure. There is severe calcifcation of the aortic valve. There is severe thickening of the aortic valve. Aortic valve  regurgitation is trivial. Mild to moderate aortic valve stenosis.  6. The inferior vena cava is normal in size with greater than 50% respiratory variability, suggesting right atrial pressure of 3 mmHg. FINDINGS  Left Ventricle: Left ventricular ejection fraction, by estimation, is 60 to 65%. The left ventricle  has normal function. The left ventricle has no regional wall motion abnormalities. Definity contrast agent was given IV to delineate the left ventricular  endocardial borders. The left ventricular internal cavity size was normal in size. There is no left ventricular hypertrophy. Left ventricular diastolic parameters are indeterminate. Right Ventricle: The right ventricular size is normal. No increase in right ventricular wall thickness. Right ventricular systolic function is moderately reduced. Left Atrium: Left atrial size was moderately dilated. Right Atrium: Right atrial size was  normal in size. Pericardium: Trivial pericardial effusion is present. The pericardial effusion is posterior to the left ventricle. Mitral Valve: The mitral valve is abnormal. There is moderate thickening of the mitral valve leaflet(s). There is moderate calcification of the mitral valve leaflet(s). Moderate mitral annular calcification. No evidence of mitral valve regurgitation. No evidence of mitral valve stenosis. Tricuspid Valve: The tricuspid valve is normal in structure. Tricuspid valve regurgitation is mild . No evidence of tricuspid stenosis. Aortic Valve: Fucntionally bicuspid with fuse right and left cusps gradietns similar to those obtained 02/18/21. The aortic valve is normal in structure. There is severe calcifcation of the aortic valve. There is severe thickening of the aortic valve. Aortic valve regurgitation is trivial. Mild to moderate aortic stenosis is present. Aortic valve mean gradient measures 17.0 mmHg. Aortic valve peak gradient measures 27.9 mmHg. Aortic valve area, by VTI measures 1.64 cm. Pulmonic Valve: The pulmonic valve was normal in structure. Pulmonic valve regurgitation is not visualized. No evidence of pulmonic stenosis. Aorta: The aortic root is normal in size and structure. Venous: The inferior vena cava is normal in size with greater than 50% respiratory variability, suggesting right atrial pressure of  3 mmHg. IAS/Shunts: No atrial level shunt detected by color flow Doppler.  LEFT VENTRICLE PLAX 2D LVIDd:         4.30 cm   Diastology LVIDs:         2.60 cm   LV e' medial:    3.81 cm/s LV PW:         0.80 cm   LV E/e' medial:  34.9 LV IVS:        1.10 cm   LV e' lateral:   7.15 cm/s LVOT diam:     2.20 cm   LV E/e' lateral: 18.6 LV SV:         70 LV SV Index:   35 LVOT Area:     3.80 cm  RIGHT VENTRICLE RV S prime:     11.50 cm/s TAPSE (M-mode): 1.4 cm LEFT ATRIUM             Index        RIGHT ATRIUM           Index LA diam:        4.90 cm 2.46 cm/m   RA Area:     23.10 cm LA Vol (A2C):   86.0 ml 43.23 ml/m  RA Volume:   73.60 ml  37.00 ml/m LA Vol (A4C):   52.1 ml 26.19 ml/m LA Biplane Vol: 70.3 ml 35.34 ml/m  AORTIC VALVE AV Area (Vmax):    1.50 cm AV Area (Vmean):   1.40 cm AV Area (VTI):     1.64 cm AV Vmax:           264.00 cm/s AV Vmean:          194.000 cm/s AV VTI:            0.428 m AV Peak Grad:      27.9 mmHg AV Mean Grad:      17.0 mmHg LVOT Vmax:         104.00 cm/s LVOT Vmean:        71.300 cm/s LVOT VTI:          0.185 m LVOT/AV VTI ratio: 0.43  AORTA Ao Root diam: 3.50 cm Ao Asc diam:  3.80 cm MITRAL VALVE  TRICUSPID VALVE MV Area (PHT): 3.34 cm     TR Peak grad:   17.8 mmHg MV Decel Time: 227 msec     TR Vmax:        211.00 cm/s MV E velocity: 133.00 cm/s                             SHUNTS                             Systemic VTI:  0.18 m                             Systemic Diam: 2.20 cm Jenkins Rouge MD Electronically signed by Jenkins Rouge MD Signature Date/Time: 01/15/2023/4:02:46 PM    Final        Estill Cotta M.D. [facility] 01/17/2023, 11:36 AM  Available via Epic secure chat 7am-7pm After 7 pm, please refer to night coverage provider listed on amion.

## 2023-01-17 NOTE — Progress Notes (Signed)
[facility] Progress Note  Subjective: 225 cc UOP recorded yest, +1.3 L net yesterday. BUN 109 and creat 6.29 today, up from early am labs yesterday, but stable compared to yest 1pm labs.   Vitals:   01/17/23 0500 01/17/23 0545 01/17/23 0600 01/17/23 0700  BP: 132/75  128/65 (!) 114/54  Pulse: 78 83 74 76  Resp: '19 16 17 '$ (!) 21  Temp:  (!) 97.2 F (36.2 C)    TempSrc:  Axillary    SpO2: 96% 96% 96% 97%  Weight: 90.1 kg     Height:        Exam: Gen alert, no distress +JVD to angle of jaw Chest dec'd L base, bilat rales 1/3 up RRR no MRG Abd soft ntnd no mass or ascites +bs Ext mild pretib/ pedal edema Neuro is alert, Ox 3 , nf, no asterixis    LUA AVF +bruit     Home meds include - allopurinol, sensipar 30 qd, rocaltrol 0.25 mcg qd, pepcid, lasix 40 qd, glipizide, insulin glargine, toprol xl 50, pravachol, prograf '2mg'$  am and '3mg'$  pm, prns/ vits/ supps       Date                          Creat               eGFR    2014                         2.1- 2.7    2015                         1.5- 2.1    Feb- aug 2022         3.0- 3.5            18- 21    Sept 2022                 3.65                 17 ml/min    Sept 2023                 4.09                 15 ml/min    01/14/23                     5.37                     01/15/23                     5.27                 11 ml/min     CXR 1/23 - IMPRESSION: 1. Reticulonodular opacities bilaterally, which may reflect atypical/viral infection.   CR chest noncont - Lungs/Pleura: Central airways are patent. Bibasilar linear opacities similar to prior exam and likely due to scarring. Small bilateral pleural effusions    UCr 134, UNa < 10    UA - prot 100, many bact, 0-5 rbc, >50 wbc    Renal US transplant - normal [place], no hydro, nonspecific elevated intrarenal resistive indices.      Assessment/ Plan: AKI on CKD 5T - b/l creat 4.1 from sept 2023, eGFR 15 ml/min. Creat here 5.2 on admission  in the setting of 1 month  coughing, poor po intake and newly + here for COVID infection. BP's were low, CT w/o edema, CXR w/o edema. UA +wbcs, renal US unremarkable. Mild pedal edema on initial exam. Viral pna is likely given hypoxia. AKI felt to be intravasc vol depletion (^^Hb/ alb, clear lungs on CT, gen exam) vs progression of CKD vs vol overload.  Tried cautious IVF's overnight w/ no improvement in B/Cr or UOP. Fluids were dc'd and started IV lasix + albumin on 1/25. CCM assisted w/ volume assessment, appreciate assistance.  B/Cr trending up today from yest am labs but stable from yest afternoon labs. Pt still not uremic and no resp distress so can hold off on RRT another day. We are trying to get him to [facility] so if HD is needed we can use his L arm AVF. I have asked pt placement to prioritize his transfer. Will follow.  Hyperkalemia - K+ 6.3 -- > down to 5.2 this am. Better. Cont renal diet.  Renal transplant - done in 2012, w/ function slowly failing. He was CKD stage 5 in sept 2023. Only on prograf for IS at home at this time, getting same dose here as at home of prograf.   BP - bp's were soft on admission, better now in 120-130 range sbp AHRF - suspected related to COVID and/or CHF, started on steroids and is now also getting IV lasix. ECHO showed normal LVEF.  COVID infection - suspected covid pna, per pmd Atrial fib - seen by cards DM2 per pmd     Rob Shondrea Steinert 01/17/2023, 7:51 AM   Recent Labs  Lab 01/16/23 0322 01/16/23 1359 01/17/23 0316  HGB 12.3*  --  12.6*  ALBUMIN 3.1*  --  3.7  CALCIUM 8.1* 8.4* 8.1*  PHOS 6.5*  --  7.6*  CREATININE 5.92* 6.34* 6.29*  K 6.3* 5.3* 5.2*    Recent Labs  Lab 01/15/23 0031  FERRITIN 367*    Inpatient medications:  allopurinol  100 mg Oral Daily   calcitRIOL  0.25 mcg Oral Daily   Chlorhexidine Gluconate Cloth  6 each Topical Q0600   cinacalcet  30 mg Oral Q48H   insulin aspart  10 Units Intravenous Once   And   dextrose  1 ampule Intravenous Once    docusate sodium  100 mg Oral BID   famotidine  20 mg Oral Daily   folic acid  2 mg Oral Daily   furosemide  80 mg Intravenous BID   heparin injection (subcutaneous)  5,000 Units Subcutaneous Q8H   insulin aspart  0-15 Units Subcutaneous TID WC   insulin aspart  0-5 Units Subcutaneous QHS   insulin aspart  4 Units Subcutaneous TID WC   insulin glargine-yfgn  24 Units Subcutaneous QHS   loratadine  10 mg Oral Daily   methylPREDNISolone (SOLU-MEDROL) injection  0.5 mg/kg Intravenous Q12H   Followed by   Derrill Memo ON 01/18/2023] predniSONE  50 mg Oral Daily   mouth rinse  15 mL Mouth Rinse 4 times per day   pravastatin  10 mg Oral QHS   sodium chloride flush  3 mL Intravenous Q12H   sodium zirconium cyclosilicate  10 g Oral TID   tacrolimus  2 mg Oral Daily   tacrolimus  3 mg Oral QHS    sodium chloride Stopped (01/14/23 1909)   sodium chloride     albumin human Stopped (01/16/23 2308)   azithromycin (ZITHROMAX) 500 mg in sodium chloride 0.9 % 250  mL IVPB Stopped (01/16/23 1319)   calcium gluconate     cefTRIAXone (ROCEPHIN)  IV Stopped (01/16/23 1303)   sodium chloride, sodium chloride, acetaminophen **OR** acetaminophen, chlorpheniramine-HYDROcodone, guaiFENesin-dextromethorphan, HYDROcodone-acetaminophen, ipratropium, mouth rinse, polyethylene glycol, sodium chloride flush

## 2023-01-18 ENCOUNTER — Inpatient Hospital Stay (HOSPITAL_COMMUNITY): Payer: Medicare Other

## 2023-01-18 DIAGNOSIS — R7989 Other specified abnormal findings of blood chemistry: Secondary | ICD-10-CM | POA: Diagnosis not present

## 2023-01-18 DIAGNOSIS — J9601 Acute respiratory failure with hypoxia: Secondary | ICD-10-CM | POA: Diagnosis not present

## 2023-01-18 DIAGNOSIS — J9602 Acute respiratory failure with hypercapnia: Secondary | ICD-10-CM | POA: Diagnosis not present

## 2023-01-18 LAB — D-DIMER, QUANTITATIVE: D-Dimer, Quant: 1.6 ug/mL-FEU — ABNORMAL HIGH (ref 0.00–0.50)

## 2023-01-18 LAB — C-REACTIVE PROTEIN: CRP: <0.5 mg/dL (ref <1.0)

## 2023-01-18 LAB — BRAIN NATRIURETIC PEPTIDE: B Natriuretic Peptide: 1317.7 pg/mL — ABNORMAL HIGH (ref 0.0–100.0)

## 2023-01-18 LAB — HEPATITIS B SURFACE ANTIGEN: Hepatitis B Surface Ag: NONREACTIVE

## 2023-01-18 LAB — BLOOD GAS, ARTERIAL
Acid-base deficit: 6 mmol/L — ABNORMAL HIGH (ref 0.0–2.0)
Bicarbonate: 24.5 mmol/L (ref 20.0–28.0)
Drawn by: 28338
O2 Saturation: 91.7 %
Patient temperature: 37
pCO2 arterial: 72 mmHg (ref 32–48)
pH, Arterial: 7.14 — CL (ref 7.35–7.45)
pO2, Arterial: 63 mmHg — ABNORMAL LOW (ref 83–108)

## 2023-01-18 LAB — CBC WITH DIFFERENTIAL/PLATELET
Abs Immature Granulocytes: 0.25 10*3/uL — ABNORMAL HIGH (ref 0.00–0.07)
Basophils Absolute: 0 10*3/uL (ref 0.0–0.1)
Basophils Relative: 0 %
Eosinophils Absolute: 0 10*3/uL (ref 0.0–0.5)
Eosinophils Relative: 0 %
HCT: 42.9 % (ref 39.0–52.0)
Hemoglobin: 12.8 g/dL — ABNORMAL LOW (ref 13.0–17.0)
Immature Granulocytes: 3 %
Lymphocytes Relative: 2 %
Lymphs Abs: 0.1 10*3/uL — ABNORMAL LOW (ref 0.7–4.0)
MCH: 31.6 pg (ref 26.0–34.0)
MCHC: 29.8 g/dL — ABNORMAL LOW (ref 30.0–36.0)
MCV: 105.9 fL — ABNORMAL HIGH (ref 80.0–100.0)
Monocytes Absolute: 0.2 10*3/uL (ref 0.1–1.0)
Monocytes Relative: 3 %
Neutro Abs: 7.3 10*3/uL (ref 1.7–7.7)
Neutrophils Relative %: 92 %
Platelets: 96 10*3/uL — ABNORMAL LOW (ref 150–400)
RBC: 4.05 MIL/uL — ABNORMAL LOW (ref 4.22–5.81)
RDW: 16.4 % — ABNORMAL HIGH (ref 11.5–15.5)
WBC: 8 10*3/uL (ref 4.0–10.5)
nRBC: 0.5 % — ABNORMAL HIGH (ref 0.0–0.2)

## 2023-01-18 LAB — GLUCOSE, CAPILLARY
Glucose-Capillary: 254 mg/dL — ABNORMAL HIGH (ref 70–99)
Glucose-Capillary: 197 mg/dL — ABNORMAL HIGH (ref 70–99)
Glucose-Capillary: 143 mg/dL — ABNORMAL HIGH (ref 70–99)
Glucose-Capillary: 152 mg/dL — ABNORMAL HIGH (ref 70–99)

## 2023-01-18 LAB — COMPREHENSIVE METABOLIC PANEL
ALT: 12 U/L (ref 0–44)
AST: 16 U/L (ref 15–41)
Albumin: 4.1 g/dL (ref 3.5–5.0)
Alkaline Phosphatase: 61 U/L (ref 38–126)
Anion gap: 12 (ref 5–15)
BUN: 121 mg/dL — ABNORMAL HIGH (ref 8–23)
CO2: 25 mmol/L (ref 22–32)
Calcium: 8 mg/dL — ABNORMAL LOW (ref 8.9–10.3)
Chloride: 100 mmol/L (ref 98–111)
Creatinine, Ser: 6.53 mg/dL — ABNORMAL HIGH (ref 0.61–1.24)
GFR, Estimated: 8 mL/min — ABNORMAL LOW (ref >60)
Glucose, Bld: 297 mg/dL — ABNORMAL HIGH (ref 70–99)
Potassium: 4.5 mmol/L (ref 3.5–5.1)
Sodium: 137 mmol/L (ref 135–145)
Total Bilirubin: 0.5 mg/dL (ref 0.3–1.2)
Total Protein: 6.5 g/dL (ref 6.5–8.1)

## 2023-01-18 LAB — PHOSPHORUS: Phosphorus: 7.8 mg/dL — ABNORMAL HIGH (ref 2.5–4.6)

## 2023-01-18 LAB — MAGNESIUM: Magnesium: 2.8 mg/dL — ABNORMAL HIGH (ref 1.7–2.4)

## 2023-01-18 MED ORDER — HALOPERIDOL 1 MG PO TABS
2.0000 mg | ORAL_TABLET | Freq: Once | ORAL | Status: AC
  Filled 2023-01-18: qty 2

## 2023-01-18 MED ORDER — CHLORHEXIDINE GLUCONATE CLOTH 2 % EX PADS
6.0000 | MEDICATED_PAD | Freq: Every day | CUTANEOUS | Status: DC
  Administered 2023-01-18 – 2023-01-19 (×2): 6 via TOPICAL

## 2023-01-18 MED ORDER — HALOPERIDOL LACTATE 5 MG/ML IJ SOLN
2.0000 mg | Freq: Once | INTRAMUSCULAR | Status: AC
  Administered 2023-01-18: 2 mg via INTRAMUSCULAR
  Filled 2023-01-18: qty 1

## 2023-01-18 MED ORDER — HEPARIN SODIUM (PORCINE) 1000 UNIT/ML DIALYSIS
2000.0000 [IU] | INTRAMUSCULAR | Status: AC | PRN
  Administered 2023-01-22 – 2023-01-24 (×2): 2000 [IU] via INTRAVENOUS_CENTRAL
  Filled 2023-01-18 (×2): qty 2

## 2023-01-18 MED ORDER — DILTIAZEM HCL 25 MG/5ML IV SOLN
10.0000 mg | Freq: Once | INTRAVENOUS | Status: DC
  Filled 2023-01-18: qty 5

## 2023-01-18 MED ORDER — FAMOTIDINE 20 MG PO TABS
10.0000 mg | ORAL_TABLET | ORAL | Status: DC
  Administered 2023-01-20: 10 mg via ORAL
  Filled 2023-01-18 (×2): qty 1

## 2023-01-18 MED ORDER — INSULIN GLARGINE-YFGN 100 UNIT/ML ~~LOC~~ SOLN
18.0000 [IU] | Freq: Two times a day (BID) | SUBCUTANEOUS | Status: DC
  Administered 2023-01-18 (×2): 18 [IU] via SUBCUTANEOUS
  Filled 2023-01-18 (×5): qty 0.18

## 2023-01-18 NOTE — Progress Notes (Signed)
[facility] Progress Note  Subjective: UOP remains poor < 300 cc/d, B/Cr up again today. Pt more sleepy and some confusion per the wife.   Vitals:   01/17/23 2029 01/17/23 2247 01/18/23 0104 01/18/23 0447  BP: 135/71  127/64 127/63  Pulse: 94  91   Resp: (!) '21 20 20 20  '$ Temp: 98 F (36.7 C)  97.6 F (36.4 C) (!) 97.3 F (36.3 C)  TempSrc: Oral  Axillary Axillary  SpO2: 100%  96% 96%  Weight:      Height:        Exam: Gen alert, no distress +JVD to angle of jaw Chest dec'd L base, bilat rales 1/3 up RRR no MRG Abd soft ntnd no mass or ascites +bs Ext mild pretib/ pedal edema Neuro is alert, Ox 3 , nf, no asterixis    LUA AVF +bruit     Home meds include - allopurinol, sensipar 30 qd, rocaltrol 0.25 mcg qd, pepcid, lasix 40 qd, glipizide, insulin glargine, toprol xl 50, pravachol, prograf '2mg'$  am and '3mg'$  pm, prns/ vits/ supps       Date                          Creat               eGFR    2014                         2.1- 2.7    2015                         1.5- 2.1    Feb- aug 2022         3.0- 3.5            18- 21    Sept 2022                 3.65                 17 ml/min    Sept 2023                 4.09                 15 ml/min    01/14/23                     5.37                     01/15/23                     5.27                 11 ml/min     CXR 1/23 - IMPRESSION: 1. Reticulonodular opacities bilaterally, which may reflect atypical/viral infection.   CR chest noncont - Lungs/Pleura: Central airways are patent. Bibasilar linear opacities similar to prior exam and likely due to scarring. Small bilateral pleural effusions    UCr 134, UNa < 10    UA - prot 100, many bact, 0-5 rbc, >50 wbc    Renal US transplant - normal [place], no hydro, nonspecific elevated intrarenal resistive indices.      Assessment/ Plan: AKI on CKD 5T - b/l creat 4.1 from sept 2023, eGFR 15 ml/min. Creat here 5.2 on admission in the setting of 1  month coughing, poor po intake  and newly + here for COVID infection. BP's were low, CT w/o edema, CXR w/o edema. UA +wbcs, renal US unremarkable. Mild pedal edema on initial exam. Viral pna is likely given hypoxia. AKI felt to be intravasc vol depletion vs vol overload vs progression of CKD.  Tried cautious IVF's w/ no improvement in B/Cr or UOP, then tried IV lasix + albumin also w/o good response. B/Cr continue to rise and pt more lethargic and is now a bit confused c/w uremia. Will plan to start dialysis today, have d/w pt and wife at length. Suspect this will be esrd.  Hyperkalemia - resolved Renal transplant - done in 2012, w/ function slowly failing. He was CKD stage 5 in sept 2023. Only on prograf for IS at home at this time, getting same dose here as at home.  BP - bp's were soft on admission, better now in 120-130 range sbp AHRF/ volume - suspected related to COVID and/or CHF. ECHO showed normal LVEF. Lower volume w/ HD today as tolerated.  COVID infection - suspected covid pna, per pmd Atrial fib - seen by cards DM2 per pmd     Rob Lyndon 01/18/2023, 9:22 AM   Recent Labs  Lab 01/17/23 0316 01/17/23 1844 01/18/23 0032  HGB 12.6* 13.3 12.8*  ALBUMIN 3.7  --  4.1  CALCIUM 8.1*  --  8.0*  PHOS 7.6*  --  7.8*  CREATININE 6.29*  --  6.53*  K 5.2*  --  4.5    Recent Labs  Lab 01/15/23 0031  FERRITIN 367*    Inpatient medications:  allopurinol  100 mg Oral Daily   calcitRIOL  0.25 mcg Oral Daily   Chlorhexidine Gluconate Cloth  6 each Topical Q0600   Chlorhexidine Gluconate Cloth  6 each Topical Q0600   cinacalcet  30 mg Oral Q48H   docusate sodium  100 mg Oral BID   famotidine  20 mg Oral Daily   folic acid  2 mg Oral Daily   heparin injection (subcutaneous)  5,000 Units Subcutaneous Q8H   insulin aspart  0-15 Units Subcutaneous TID WC   insulin aspart  0-5 Units Subcutaneous QHS   insulin aspart  8 Units Subcutaneous TID WC   insulin glargine-yfgn  18 Units Subcutaneous BID   loratadine  10 mg  Oral Daily   mouth rinse  15 mL Mouth Rinse 4 times per day   pravastatin  10 mg Oral QHS   predniSONE  50 mg Oral Daily   sodium chloride flush  3 mL Intravenous Q12H   sodium zirconium cyclosilicate  10 g Oral TID   tacrolimus  2 mg Oral Daily   tacrolimus  3 mg Oral QHS    sodium chloride Stopped (01/14/23 1909)   sodium chloride     azithromycin (ZITHROMAX) 500 mg in sodium chloride 0.9 % 250 mL IVPB Stopped (01/17/23 1319)   cefTRIAXone (ROCEPHIN)  IV Stopped (01/17/23 1350)   sodium chloride, sodium chloride, acetaminophen **OR** acetaminophen, chlorpheniramine-HYDROcodone, guaiFENesin-dextromethorphan, HYDROcodone-acetaminophen, ipratropium, mouth rinse, polyethylene glycol, sodium chloride flush

## 2023-01-18 NOTE — Progress Notes (Signed)
   01/18/23 1800  Assess: MEWS Score  BP 128/84  MAP (mmHg) 94  Pulse Rate (!) 110  ECG Heart Rate (!) 117  Resp (!) 27  SpO2 97 %  Assess: MEWS Score  MEWS Temp 0  MEWS Systolic 0  MEWS Pulse 2  MEWS RR 2  MEWS LOC 0  MEWS Score 4  MEWS Score Color Red  Treat  Pain Scale 0-10  Pain Score 0  Take Vital Signs  Increase Vital Sign Frequency  Red: Q 1hr X 4 then Q 4hr X 4, if remains red, continue Q 4hrs  Escalate  MEWS: Escalate Red: discuss with charge nurse/RN and provider, consider discussing with RRT  Notify: Charge Nurse/RN  Name of Charge Nurse/RN Notified Geraldine RN  Date Charge Nurse/RN Notified 01/18/23  Time Charge Nurse/RN Notified 3500  Provider Notification  Provider Name/Title DR. Candiss Norse  Date Provider Notified 01/18/23  Time Provider Notified 9381  Method of Notification Call  Notification Reason Change in status  Provider response See new orders  Date of Provider Response 01/18/23  Time of Provider Response 1830  Document  Patient Outcome Stabilized after interventions  Progress note created (see row info) Yes  Assess: SIRS CRITERIA  SIRS Temperature  0  SIRS Pulse 1  SIRS Respirations  1  SIRS WBC 0  SIRS Score Sum  2

## 2023-01-18 NOTE — Progress Notes (Signed)
Lower extremity venous duplex has been completed.   Preliminary results in CV Proc.   Russell Bowers 01/18/2023 11:39 AM

## 2023-01-18 NOTE — Progress Notes (Signed)
Patient has increased confusion and heart rate has increased into the 120's. Obtained EKG and notified MD. Received new orders.

## 2023-01-18 NOTE — Evaluation (Signed)
Occupational Therapy Evaluation Patient Details Name: Russell Bowers MRN: [id number] DOB: [birth year] Today's Date: 01/18/2023   History of Present Illness Pt is a 72 y/o M presenting to ED on 1/23 with cough and SOB with exertion, found to be hypoxic at 78% on RA, COVID positive. Admitted for acute respiratory failure with hypoxia and hypercapnia. PMH includes chronic diastolic heart failure, DM2, renal transplant on tacrolimus, aortic stenosis, OSA On BiPAP.   Clinical Impression   Pt reports independence at baseline with ADLs and functional mobility, lives with spouse who can assist at d/c. Pt currently needing min guard-mod A for ADLs, supervision for bed mobility, and min A for transfers. Pt with mild LOB with simulated toilet transfer, able to self correct with min A. Pt HR up to 110bpm, SpO2 in 90's throughout session on 6L O2. Pt presenting with impairments listed below, will follow acutely. Recommend HHOT at d/c.     Recommendations for follow up therapy are one component of a multi-disciplinary discharge planning process, led by the attending physician.  Recommendations may be updated based on patient status, additional functional criteria and insurance authorization.   Follow Up Recommendations  Home health OT     Assistance Recommended at Discharge Intermittent Supervision/Assistance  Patient can return home with the following A little help with walking and/or transfers;A little help with bathing/dressing/bathroom;Assistance with cooking/housework;Direct supervision/assist for medications management;Direct supervision/assist for financial management;Assist for transportation;Help with stairs or ramp for entrance    Functional Status Assessment  Patient has had a recent decline in their functional status and demonstrates the ability to make significant improvements in function in a reasonable and predictable amount of time.  Equipment Recommendations  None recommended by OT (pt  has all needed DME)    Recommendations for Other Services PT consult     Precautions / Restrictions Precautions Precautions: Fall Restrictions Weight Bearing Restrictions: No      Mobility Bed Mobility Overal bed mobility: Needs Assistance Bed Mobility: Supine to Sit     Supine to sit: Supervision          Transfers Overall transfer level: Needs assistance Equipment used: None Transfers: Sit to/from Stand Sit to Stand: Min assist           General transfer comment: mild LOB requiring min A to correct      Balance Overall balance assessment: Needs assistance Sitting-balance support: Bilateral upper extremity supported, Feet supported Sitting balance-Leahy Scale: Good     Standing balance support: Bilateral upper extremity supported, During functional activity Standing balance-Leahy Scale: Fair Standing balance comment: mild LOB, reaching out for external support minimally                           ADL either performed or assessed with clinical judgement   ADL Overall ADL's : Needs assistance/impaired Eating/Feeding: Modified independent   Grooming: Min guard   Upper Body Bathing: Minimal assistance   Lower Body Bathing: Moderate assistance   Upper Body Dressing : Minimal assistance   Lower Body Dressing: Moderate assistance   Toilet Transfer: Min guard;Ambulation;Regular Toilet           Functional mobility during ADLs: Min guard;Minimal assistance       Vision   Vision Assessment?: No apparent visual deficits     Perception Perception Perception Tested?: No   Praxis Praxis Praxis tested?: Not tested    Pertinent Vitals/Pain Pain Assessment Pain Assessment: No/denies pain     Hand Dominance  Right   Extremity/Trunk Assessment Upper Extremity Assessment Upper Extremity Assessment: Overall WFL for tasks assessed   Lower Extremity Assessment Lower Extremity Assessment: Defer to PT evaluation   Cervical / Trunk  Assessment Cervical / Trunk Assessment: Normal   Communication Communication Communication: No difficulties   Cognition Arousal/Alertness: Awake/alert Behavior During Therapy: Flat affect Overall Cognitive Status: Within Functional Limits for tasks assessed                                 General Comments: follows commands appropriately, flat affect and spouse frequently answering per pt. Pt with slow processing noted and decreased initiation, per spouse pt appears baseline     General Comments  VSS on 6L O2, HR up to 110 with in-room mobility    Exercises     Shoulder Instructions      Home Living Family/patient expects to be discharged to:: Private residence Living Arrangements: Spouse/significant other Available Help at Discharge: Family;Available 24 hours/day Type of Home: House Home Access: Stairs to enter [employer] of Steps: 3 Entrance Stairs-Rails: Can reach both Home Layout: One level     Bathroom Shower/Tub: Occupational psychologist: Standard Bathroom Accessibility: Yes   Home Equipment: Shower seat - built in;Grab bars - tub/shower   Additional Comments: wears Bipap at night      Prior Functioning/Environment Prior Level of Function : Independent/Modified Independent             Mobility Comments: no AD ADLs Comments: ind with ADLs        OT Problem List: Decreased strength;Decreased range of motion;Decreased activity tolerance;Decreased cognition;Decreased safety awareness;Impaired balance (sitting and/or standing);Cardiopulmonary status limiting activity      OT Treatment/Interventions: Self-care/ADL training;Therapeutic exercise;Energy conservation;DME and/or AE instruction;Therapeutic activities;Patient/family education;Balance training    OT Goals(Current goals can be found in the care plan section) Acute Rehab OT Goals Patient Stated Goal: none stated OT Goal Formulation: With patient Time For Goal  Achievement: 02/01/23 Potential to Achieve Goals: Good ADL Goals Pt Will Perform Upper Body Dressing: with modified independence;sitting Pt Will Perform Lower Body Dressing: with modified independence;sit to/from stand Pt Will Transfer to Toilet: with modified independence;ambulating;regular height toilet Pt Will Perform Tub/Shower Transfer: Shower transfer;with modified independence;ambulating;shower seat  OT Frequency: Min 2X/week    Co-evaluation              AM-PAC OT "6 Clicks" Daily Activity     Outcome Measure Help from another person eating meals?: None Help from another person taking care of personal grooming?: A Little Help from another person toileting, which includes using toliet, bedpan, or urinal?: A Little Help from another person bathing (including washing, rinsing, drying)?: A Lot Help from another person to put on and taking off regular upper body clothing?: A Little Help from another person to put on and taking off regular lower body clothing?: A Lot 6 Click Score: 17   End of Session Equipment Utilized During Treatment: Gait belt;Oxygen (6L) Nurse Communication: Mobility status  Activity Tolerance: Patient tolerated treatment well Patient left: in chair;with call bell/phone within reach;with chair alarm set;with family/visitor present  OT Visit Diagnosis: Unsteadiness on feet (R26.81);Other abnormalities of gait and mobility (R26.89);Muscle weakness (generalized) (M62.81)                Time: 0160-1093 OT Time Calculation (min): 26 min Charges:  OT General Charges $OT Visit: 1 Visit OT Evaluation $OT Eval Moderate  Complexity: 1 Mod OT Treatments $Self Care/Home Management : 8-22 mins  Renaye Rakers, OTD, OTR/L SecureChat Preferred Acute Rehab [phone]  Ulla Gallo 01/18/2023, 10:41 AM

## 2023-01-18 NOTE — Progress Notes (Addendum)
PT Cancellation Note  Patient Details Name: Russell Bowers MRN: [id number] DOB: [birth year]   Cancelled Treatment:    Reason Eval/Treat Not Completed: Patient at procedure or test/unavailable  Currently in room receiving dialysis treatment per RN.   Will follow-up for comprehensive PT evaluation as schedule permits. Likely tomorrow.     [name] 01/18/2023, 3:49 PM

## 2023-01-18 NOTE — Progress Notes (Signed)
   01/18/23 1738  Vitals  Temp (!) 97.4 F (36.3 C)  Temp Source Oral  BP 118/73  MAP (mmHg) 87  BP Location Right Arm  BP Method Automatic  Patient Position (if appropriate) Lying  Pulse Rate (!) 117  Pulse Rate Source Monitor  ECG Heart Rate (!) 119  Resp (!) 28  Oxygen Therapy  SpO2 97 %  Post Treatment  Dialyzer Clearance Clear  Duration of HD Treatment -hour(s) 3 hour(s)  Liters Processed 58.8  Fluid Removed (mL) 2500 mL  Tolerated HD Treatment Yes  Post-Hemodialysis Comments tolerated well goal met  AVG/AVF Arterial Site Held (minutes) 10 minutes  AVG/AVF Venous Site Held (minutes) 10 minutes  Note  Observations 1st treatment tolerated well

## 2023-01-18 NOTE — Progress Notes (Addendum)
[facility]                                                                              Russell Bowers, is a 72 y.o. male, DOB - 1951/03/16, TKP:[id number] Admit date - 01/14/2023    Outpatient Primary MD for the patient is Russell Cruel, MD  LOS - 4  days  Chief Complaint  Patient presents with   Fatigue   Cough       Brief summary   Patient is a 72year old male with DM2, OSA on BiPAP, diastolic CHF, Renal transplant due to ESRD secondary to IgA nephropathy s/p DDKT on 12/01/2011, recurent UTI, history of prostate cancer s/p radical prostatectomy in 2011 presented with shortness of breath, cough and SOB for the past 1 week. No fever no chills. Uses BIpap for sleep at baseline, No history of PE/DVT.  He was found to be COVID-positive with evidence of fluid overload, he also went into AKI with suspicion for kidney transplant rejection.  He was admitted to [facility] and subsequently transferred to [facility] for further care.  He was transferred to my care on 01/18/2022 on day 4 of his hospital stay.   Assessment & Plan    Acute respiratory failure with hypoxia and hypercapnia (HCC) 2 fluid overload with acute on chronic diastolic CHF EF 51%, worsening renal function. -His main issue appears to be volume overload, renal function is declining, nephrology is following the patient and assisting with diuresis and fluid removal, COVID-19 infection appears incidental.  Uses nighttime BiPAP daytime oxygen, continue to advance activity and titrate down oxygen as needed, added flutter valve and I-S for pulmonary toiletry encouraged to sit in chair in the daytime.  Pneumonia due to COVID-19 virus  -Mild with stable CRP, low-dose steroids.  Acute on chronic heart failure with preserved ejection fraction (HFpEF) ([name]), mild AR, AS.  EF 60 to 65%  -Etiology and nephrology both following and managing diuresis.  Acute kidney injury, superimposed on CKD5, history of renal  transplantation -Creatinine 4.09 on 08/25/2022, on steroids and transplant medications, nephrology following closely.  May require HD.  Hyperkalemia -Continue Lokelma scheduled  Severe sepsis secondary to COVID-19, pneumonia, ?urinary tract infection - -Met sepsis criteria on admission with tachypnea, tachycardia, respiratory failure, AKI, source likely UTI, COVID-pneumonia, has history of frequent UTIs, urine culture showed no growth  Mildly high D Dimer - likely due to mild Covid, Leg [place] stable, on Hep - monitor.  OSA (obstructive sleep apnea)   - continue BiPAP qhs, per wife's request ABG will be checked on 01/18/2023.  Mentation is stable.  Anemia due to chronic kidney disease  -H&H stable  Suspected atrial fibrillation, ruled out by cardiology. -Per patient, no prior history of atrial fibrillation -Cardiology was consulted, upon review of EKG and telemetry appears to be in NSR with second-degree type I AV block/Wenckebach and NOT A-fib. -No indication for anticoagulation   DM2 (diabetes mellitus, type 2) ([name]), uncontrolled with hyperglycemia, IDDM - HbA1C 7.7 CBG (last 3)  Recent Labs    01/17/23 1748 01/17/23 2121 01/18/23 0629  GLUCAP 291* 305* 254*   -CBGs  elevated, increase to Semglee to 30 units at bedtime, increase meal coverage to 8 units 3 times daily AC, moderate SSI     Estimated body mass index is 29.33 kg/m as calculated from the following:   Height as of this encounter: '5\' 9"'$  (1.753 m).   Weight as of this encounter: 90.1 kg.  Code Status: full  DVT Prophylaxis:  heparin injection 5,000 Units Start: 01/15/23 2200heparin gtt   Level of Care: Level of care: Progressive Family Communication: Updated patient's wife at the bedside in detail on 01/18/2023 Disposition Plan:      Remains inpatient appropriate: Transfer to [facility] pending for closer monitoring and possibly needing HD in near future (has AV fistula)  Procedures:  Bipap QHS  Consultants:    Cardiology Nephrology CCM    Medications  allopurinol  100 mg Oral Daily   calcitRIOL  0.25 mcg Oral Daily   Chlorhexidine Gluconate Cloth  6 each Topical Q0600   cinacalcet  30 mg Oral Q48H   insulin aspart  10 Units Intravenous Once   And   dextrose  1 ampule Intravenous Once   docusate sodium  100 mg Oral BID   famotidine  20 mg Oral Daily   folic acid  2 mg Oral Daily   furosemide  80 mg Intravenous BID   heparin injection (subcutaneous)  5,000 Units Subcutaneous Q8H   insulin aspart  0-15 Units Subcutaneous TID WC   insulin aspart  0-5 Units Subcutaneous QHS   insulin aspart  8 Units Subcutaneous TID WC   insulin glargine-yfgn  18 Units Subcutaneous BID   loratadine  10 mg Oral Daily   mouth rinse  15 mL Mouth Rinse 4 times per day   pravastatin  10 mg Oral QHS   predniSONE  50 mg Oral Daily   sodium chloride flush  3 mL Intravenous Q12H   sodium zirconium cyclosilicate  10 g Oral TID   tacrolimus  2 mg Oral Daily   tacrolimus  3 mg Oral QHS      Subjective:   Patient in bed, appears comfortable, denies any headache, no fever, no chest pain or pressure, no shortness of breath , no abdominal pain. No new focal weakness.   Objective:   Vitals:   01/17/23 2029 01/17/23 2247 01/18/23 0104 01/18/23 0447  BP: 135/71  127/64 127/63  Pulse: 94  91   Resp: (!) '21 20 20 20  '$ Temp: 98 F (36.7 C)  97.6 F (36.4 C) (!) 97.3 F (36.3 C)  TempSrc: Oral  Axillary Axillary  SpO2: 100%  96% 96%  Weight:      Height:        Intake/Output Summary (Last 24 hours) at 01/18/2023 0908 Last data filed at 01/18/2023 0600 Gross per 24 hour  Intake 717.82 ml  Output 250 ml  Net 467.82 ml     Wt Readings from Last 3 Encounters:  01/17/23 90.1 kg  04/25/22 81.7 kg  07/26/21 79.4 kg   Physical Exam  Awake Alert, No new F.N deficits, Normal affect [place].AT,PERRAL Supple Neck, No JVD,   Symmetrical Chest wall movement, Good air movement bilaterally, CTAB RRR,No Gallops,  Rubs or new Murmurs,  +ve B.Sounds, Abd Soft, No tenderness,   No Cyanosis, Clubbing or edema     Data Reviewed:  I have personally reviewed following labs   Recent Labs  Lab 01/15/23 0042 01/16/23 0322 01/17/23 0316 01/17/23 1844 01/18/23 0032  WBC 7.2 3.5* 7.1 8.7 8.0  HGB  13.9 12.3* 12.6* 13.3 12.8*  HCT 48.1 41.9 43.4 45.5 42.9  PLT 136* 123* 116* 111* 96*  MCV 106.9* 106.3* 108.2* 108.1* 105.9*  MCH 30.9 31.2 31.4 31.6 31.6  MCHC 28.9* 29.4* 29.0* 29.2* 29.8*  RDW 16.2* 16.1* 16.2* 16.4* 16.4*  LYMPHSABS 0.4* 0.3* 0.3* 0.2* 0.1*  MONOABS 0.1 0.1 0.2 0.4 0.2  EOSABS 0.0 0.0 0.0 0.0 0.0  BASOSABS 0.0 0.0 0.0 0.0 0.0    Recent Labs  Lab 01/14/23 1249 01/14/23 1319 01/14/23 1420 01/15/23 0042 01/15/23 0112 01/16/23 0322 01/16/23 1359 01/17/23 0316 01/18/23 0032  NA 136   < >  --  135  --  136 137 135 137  K 4.9   < >  --  6.0*  --  6.3* 5.3* 5.2* 4.5  CL 100  --   --  100  --  103 99 102 100  CO2 27  --   --  24  --  '22 22 22 25  '$ ANIONGAP 9  --   --  11  --  11 16* 11 12  GLUCOSE 192*  --   --  182*  --  323* 316* 294* 297*  BUN 81*  --   --  76*  --  112* 103* 109* 121*  CREATININE 5.37*  --   --  5.27*  --  5.92* 6.34* 6.29* 6.53*  AST 11*  --   --  12*  --  10*  --  10* 16  ALT 12  --   --  14  --  12  --  13 12  ALKPHOS 74  --   --  69  --  65  --  60 61  BILITOT 0.7  --   --  0.7  --  0.4  --  0.6 0.5  ALBUMIN 4.0  --   --  3.3*  --  3.1*  --  3.7 4.1  CRP  --   --   --  1.0*  --  0.6  --  0.5 <0.5  DDIMER  --   --   --   --  0.68* 0.55*  --  0.77* 1.60*  PROCALCITON  --   --   --  0.16  --  0.25  --   --   --   LATICACIDVEN  --   --  0.7  --   --   --   --   --   --   INR  --   --   --   --  1.2  --   --   --   --   TSH  --   --   --  1.077  --   --   --   --   --   HGBA1C  --   --   --  7.7*  --   --   --   --   --   BNP 1,704.0*  --   --   --   --   --   --   --  1,317.7*  MG  --   --   --  2.8*  --  2.9*  --  2.7* 2.8*  CALCIUM 9.8  --   --   8.5*  --  8.1* 8.4* 8.1* 8.0*   < > = values in this interval not displayed.      Radiology Studies: I have personally reviewed the imaging studies  DG CHEST  PORT 1 VIEW  Result Date: 01/16/2023 CLINICAL DATA:  Acute renal failure superimposed on chronic kidney disease EXAM: PORTABLE CHEST 1 VIEW COMPARISON:  Multiple exams, including 01/14/2023 FINDINGS: Low lung volumes are present, causing crowding of the pulmonary vasculature. There is a small amount of fluid in the minor fissure along with blunting of both costophrenic angles compatible with small bilateral pleural effusions. Indistinct pulmonary vasculature compatible with pulmonary venous hypertension. Cardiac borders partially obscured but I anticipate that there is mild enlargement of the cardiopericardial silhouette. IMPRESSION: 1. Mild enlargement of the cardiopericardial silhouette with pulmonary venous hypertension and small bilateral pleural effusions. Findings favor congestive heart failure. 2. Low lung volumes. 3. No findings of pneumonia. Electronically Signed   By: Van Clines M.D.   On: 01/16/2023 10:06    Signature  -    Lala Lund M.D on 01/18/2023 at 9:08 AM   -  To page go to [email or web address]

## 2023-01-19 DIAGNOSIS — J9601 Acute respiratory failure with hypoxia: Secondary | ICD-10-CM | POA: Diagnosis not present

## 2023-01-19 DIAGNOSIS — J9602 Acute respiratory failure with hypercapnia: Secondary | ICD-10-CM | POA: Diagnosis not present

## 2023-01-19 LAB — CBC WITH DIFFERENTIAL/PLATELET
Abs Immature Granulocytes: 0.16 10*3/uL — ABNORMAL HIGH (ref 0.00–0.07)
Basophils Absolute: 0 10*3/uL (ref 0.0–0.1)
Basophils Relative: 0 %
Eosinophils Absolute: 0 10*3/uL (ref 0.0–0.5)
Eosinophils Relative: 0 %
HCT: 38.1 % — ABNORMAL LOW (ref 39.0–52.0)
Hemoglobin: 11.7 g/dL — ABNORMAL LOW (ref 13.0–17.0)
Immature Granulocytes: 3 %
Lymphocytes Relative: 3 %
Lymphs Abs: 0.2 10*3/uL — ABNORMAL LOW (ref 0.7–4.0)
MCH: 31.8 pg (ref 26.0–34.0)
MCHC: 30.7 g/dL (ref 30.0–36.0)
MCV: 103.5 fL — ABNORMAL HIGH (ref 80.0–100.0)
Monocytes Absolute: 0.4 10*3/uL (ref 0.1–1.0)
Monocytes Relative: 7 %
Neutro Abs: 5.4 10*3/uL (ref 1.7–7.7)
Neutrophils Relative %: 87 %
Platelets: 70 10*3/uL — ABNORMAL LOW (ref 150–400)
RBC: 3.68 MIL/uL — ABNORMAL LOW (ref 4.22–5.81)
RDW: 15.9 % — ABNORMAL HIGH (ref 11.5–15.5)
WBC: 6.2 10*3/uL (ref 4.0–10.5)
nRBC: 0.6 % — ABNORMAL HIGH (ref 0.0–0.2)

## 2023-01-19 LAB — COMPREHENSIVE METABOLIC PANEL
ALT: 14 U/L (ref 0–44)
AST: 14 U/L — ABNORMAL LOW (ref 15–41)
Albumin: 3.8 g/dL (ref 3.5–5.0)
Alkaline Phosphatase: 45 U/L (ref 38–126)
Anion gap: 9 (ref 5–15)
BUN: 68 mg/dL — ABNORMAL HIGH (ref 8–23)
CO2: 28 mmol/L (ref 22–32)
Calcium: 8.1 mg/dL — ABNORMAL LOW (ref 8.9–10.3)
Chloride: 98 mmol/L (ref 98–111)
Creatinine, Ser: 4.75 mg/dL — ABNORMAL HIGH (ref 0.61–1.24)
GFR, Estimated: 12 mL/min — ABNORMAL LOW (ref >60)
Glucose, Bld: 99 mg/dL (ref 70–99)
Potassium: 3.2 mmol/L — ABNORMAL LOW (ref 3.5–5.1)
Sodium: 135 mmol/L (ref 135–145)
Total Bilirubin: 0.5 mg/dL (ref 0.3–1.2)
Total Protein: 5.7 g/dL — ABNORMAL LOW (ref 6.5–8.1)

## 2023-01-19 LAB — GLUCOSE, CAPILLARY
Glucose-Capillary: 61 mg/dL — ABNORMAL LOW (ref 70–99)
Glucose-Capillary: 73 mg/dL (ref 70–99)
Glucose-Capillary: 113 mg/dL — ABNORMAL HIGH (ref 70–99)
Glucose-Capillary: 176 mg/dL — ABNORMAL HIGH (ref 70–99)
Glucose-Capillary: 195 mg/dL — ABNORMAL HIGH (ref 70–99)

## 2023-01-19 LAB — CULTURE, BLOOD (ROUTINE X 2)
Culture: NO GROWTH
Culture: NO GROWTH
Special Requests: ADEQUATE
Special Requests: ADEQUATE

## 2023-01-19 LAB — C-REACTIVE PROTEIN: CRP: <0.5 mg/dL (ref <1.0)

## 2023-01-19 LAB — BRAIN NATRIURETIC PEPTIDE: B Natriuretic Peptide: 811 pg/mL — ABNORMAL HIGH (ref 0.0–100.0)

## 2023-01-19 LAB — TSH: TSH: 0.311 u[IU]/mL — ABNORMAL LOW (ref 0.350–4.500)

## 2023-01-19 LAB — PHOSPHORUS: Phosphorus: 5.6 mg/dL — ABNORMAL HIGH (ref 2.5–4.6)

## 2023-01-19 LAB — D-DIMER, QUANTITATIVE: D-Dimer, Quant: 1.36 ug/mL-FEU — ABNORMAL HIGH (ref 0.00–0.50)

## 2023-01-19 LAB — MAGNESIUM: Magnesium: 2.3 mg/dL (ref 1.7–2.4)

## 2023-01-19 MED ORDER — METOPROLOL TARTRATE 5 MG/5ML IV SOLN
5.0000 mg | Freq: Three times a day (TID) | INTRAVENOUS | Status: DC | PRN
  Administered 2023-01-21 – 2023-01-25 (×2): 5 mg via INTRAVENOUS
  Filled 2023-01-19 (×2): qty 5

## 2023-01-19 MED ORDER — APIXABAN 2.5 MG PO TABS
2.5000 mg | ORAL_TABLET | Freq: Two times a day (BID) | ORAL | Status: DC
  Administered 2023-01-19 – 2023-01-21 (×5): 2.5 mg via ORAL
  Filled 2023-01-19 (×4): qty 1

## 2023-01-19 MED ORDER — HYDROCODONE-ACETAMINOPHEN 5-325 MG PO TABS
1.0000 | ORAL_TABLET | Freq: Four times a day (QID) | ORAL | Status: DC | PRN
  Administered 2023-01-21: 1 via ORAL
  Filled 2023-01-19: qty 1

## 2023-01-19 MED ORDER — METOPROLOL TARTRATE 25 MG PO TABS
25.0000 mg | ORAL_TABLET | Freq: Two times a day (BID) | ORAL | Status: DC
  Administered 2023-01-19: 25 mg via ORAL
  Filled 2023-01-19: qty 1

## 2023-01-19 MED ORDER — POTASSIUM CHLORIDE CRYS ER 20 MEQ PO TBCR
40.0000 meq | EXTENDED_RELEASE_TABLET | Freq: Once | ORAL | Status: AC
  Administered 2023-01-19: 40 meq via ORAL
  Filled 2023-01-19: qty 2

## 2023-01-19 MED ORDER — INSULIN GLARGINE-YFGN 100 UNIT/ML ~~LOC~~ SOLN
16.0000 [IU] | Freq: Two times a day (BID) | SUBCUTANEOUS | Status: DC
  Filled 2023-01-19 (×2): qty 0.16

## 2023-01-19 MED ORDER — CHLORHEXIDINE GLUCONATE CLOTH 2 % EX PADS
6.0000 | MEDICATED_PAD | Freq: Every day | CUTANEOUS | Status: DC
  Administered 2023-01-20 – 2023-01-28 (×8): 6 via TOPICAL

## 2023-01-19 MED ORDER — INSULIN GLARGINE-YFGN 100 UNIT/ML ~~LOC~~ SOLN
12.0000 [IU] | Freq: Two times a day (BID) | SUBCUTANEOUS | Status: DC
  Administered 2023-01-19 (×2): 12 [IU] via SUBCUTANEOUS
  Filled 2023-01-19 (×4): qty 0.12

## 2023-01-19 MED ORDER — SODIUM CHLORIDE 0.9% FLUSH
3.0000 mL | INTRAVENOUS | Status: DC | PRN
  Administered 2023-01-20: 3 mL via INTRAVENOUS

## 2023-01-19 MED ORDER — APIXABAN 5 MG PO TABS: 5.0000 mg | ORAL_TABLET | Freq: Two times a day (BID) | ORAL | Status: DC

## 2023-01-19 MED ORDER — PREDNISONE 20 MG PO TABS
20.0000 mg | ORAL_TABLET | Freq: Every day | ORAL | Status: DC
  Administered 2023-01-19 – 2023-01-20 (×2): 20 mg via ORAL
  Filled 2023-01-19 (×2): qty 1

## 2023-01-19 MED ORDER — CHLORHEXIDINE GLUCONATE CLOTH 2 % EX PADS: 6.0000 | MEDICATED_PAD | Freq: Every day | CUTANEOUS | Status: DC

## 2023-01-19 MED ORDER — SODIUM CHLORIDE 0.9 % IV SOLN
250.0000 mL | INTRAVENOUS | Status: DC | PRN
  Administered 2023-01-19: 50 mL via INTRAVENOUS
  Administered 2023-01-28: 250 mL via INTRAVENOUS

## 2023-01-19 MED ORDER — INSULIN ASPART 100 UNIT/ML IJ SOLN: 4.0000 [IU] | Freq: Three times a day (TID) | INTRAMUSCULAR | Status: DC

## 2023-01-19 MED ORDER — MIDODRINE HCL 5 MG PO TABS
10.0000 mg | ORAL_TABLET | Freq: Three times a day (TID) | ORAL | Status: DC
  Administered 2023-01-19 – 2023-01-21 (×5): 10 mg via ORAL
  Filled 2023-01-19 (×6): qty 2

## 2023-01-19 MED ORDER — SODIUM CHLORIDE 0.9% FLUSH
3.0000 mL | Freq: Two times a day (BID) | INTRAVENOUS | Status: DC
  Administered 2023-01-19 – 2023-01-30 (×25): 3 mL via INTRAVENOUS

## 2023-01-19 NOTE — Progress Notes (Signed)
ANTICOAGULATION CONSULT NOTE - Initial Consult  Pharmacy Consult for Eliquis  Indication: new onset atrial fibrillation  Allergies  Allergen Reactions   Azathioprine Other (See Comments)   Pollen Extract Other (See Comments)    stuffy nose    Patient Measurements: Height: '5\' 9"'$  (175.3 cm) Weight: 91.2 kg (201 lb 1 oz) IBW/kg (Calculated) : 70.7  Vital Signs: Temp: 97.6 F (36.4 C) (01/28 0834) Temp Source: Oral (01/28 0834) BP: 111/73 (01/28 0834) Pulse Rate: 106 (01/28 0834)  Labs: Recent Labs    01/17/23 0316 01/17/23 1844 01/18/23 0032 01/19/23 0045  HGB 12.6* 13.3 12.8* 11.7*  HCT 43.4 45.5 42.9 38.1*  PLT 116* 111* 96* 70*  CREATININE 6.29*  --  6.53* 4.75*    Estimated Creatinine Clearance: 15.9 mL/min (A) (by C-G formula based on SCr of 4.75 mg/dL (H)).   Medical History: Past Medical History:  Diagnosis Date   ADHD (attention deficit hyperactivity disorder)    Allergic rhinitis    Cancer ([place])    Prostate cancer   Cellulitis and abscess of trunk 2014   history of back abscess   Cellulitis and abscess of unspecified site 2014   lower back   Detached retina    Diabetes mellitus without complication (HCC)    Elevated troponin    Fistula    OF LEFT ARM   Gout    H/O kidney transplant    History of congestive heart failure    FOLLOWED BY DR. 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 Melvyn Novas   Hypertension    Mild concentric left ventricular hypertrophy (LVH)    AND AORTIC STENOSIS FOLLOWED IN THE PAST BY CARDIOLOGIST DR. Marlou Porch.   OSA treated with BiPAP    Personal history of colonic polyps    Prostate cancer ([place]) 11/2010   PROSTATECTOMY WITH UROLOGIST DR. Myson Levi   Renal failure    Shortness of breath    Sleep disorder breathing    FOLLOWED BY PULMONOLOGIST DR. Elsworth Soho    Medications:  Scheduled:   allopurinol   100 mg Oral Daily   calcitRIOL  0.25 mcg Oral Daily   Chlorhexidine Gluconate Cloth  6 each Topical Q0600   cinacalcet  30 mg Oral Q48H   docusate sodium  100 mg Oral BID   [START ON 01/20/2023] famotidine  10 mg Oral QODAY   folic acid  2 mg Oral Daily   heparin injection (subcutaneous)  5,000 Units Subcutaneous Q8H   insulin aspart  0-15 Units Subcutaneous TID WC   insulin aspart  0-5 Units Subcutaneous QHS   insulin glargine-yfgn  12 Units Subcutaneous BID   loratadine  10 mg Oral Daily   metoprolol tartrate  25 mg Oral BID   mouth rinse  15 mL Mouth Rinse 4 times per day   pravastatin  10 mg Oral QHS   predniSONE  20 mg Oral Daily   tacrolimus  3 mg Oral QHS   Infusions:   azithromycin (ZITHROMAX) 500 mg in sodium chloride 0.9 % 250 mL IVPB 500 mg (01/18/23 1805)   cefTRIAXone (ROCEPHIN)  IV 2 g (01/18/23 1244)    Assessment: 71 y.o. male.  With PMH of chronic diastolic heart failure, DM2, history of renal transplant on tacrolimus, aortic stenosis, OSA on BiPAP presenting with worsening shortness of breath and cough over the past 1 to 2 weeks. He is COVID positive. Noted  to be in atrial fibrillation, no history of this and no AC PTA. Atrial fibrillation was ruled out earlier in admission, however, EKG on 1/27 was suspicious of atrial fibrillation and confirmed by cardiology on 1/28. Pharmacy consulted to dose Eliquis for new onset atrial fibrillation.   Patient was previously on a heparin gtt for afib until 1/24 and then transitioned to SQ heparin for DVT ppx. Hgb 11.7--stable. Plts 70--low. Given decreased platelets, will start apixaban at 2.'5mg'$  BID per Dr. Candiss Norse.   Goal of Therapy:  Monitor platelets by anticoagulation protocol: Yes   Plan:  Start apixaban 2.'5mg'$  BID today  Monitor hgb, platelets, and s/sx of bleeding daily  If platelets improve, consider increasing apixaban dose   Billey Gosling, PharmD PGY1 Pharmacy Resident 1/28/20249:15 AM

## 2023-01-19 NOTE — Plan of Care (Signed)
  Problem: Education: Goal: Knowledge of General Education information will improve Description: Including pain rating scale, medication(s)/side effects and non-pharmacologic comfort measures Outcome: Progressing   Problem: Health Behavior/Discharge Planning: Goal: Ability to manage health-related needs will improve Outcome: Progressing   Problem: Clinical Measurements: Goal: Ability to maintain clinical measurements within normal limits will improve Outcome: Progressing Goal: Will remain free from infection Outcome: Progressing Goal: Diagnostic test results will improve Outcome: Progressing Goal: Respiratory complications will improve Outcome: Progressing Goal: Cardiovascular complication will be avoided Outcome: Progressing   Problem: Activity: Goal: Risk for activity intolerance will decrease Outcome: Progressing   Problem: Nutrition: Goal: Adequate nutrition will be maintained Outcome: Progressing   Problem: Coping: Goal: Level of anxiety will decrease Outcome: Progressing   Problem: Elimination: Goal: Will not experience complications related to bowel motility Outcome: Progressing Goal: Will not experience complications related to urinary retention Outcome: Progressing   Problem: Pain Managment: Goal: General experience of comfort will improve Outcome: Progressing   Problem: Safety: Goal: Ability to remain free from injury will improve Outcome: Progressing   Problem: Skin Integrity: Goal: Risk for impaired skin integrity will decrease Outcome: Progressing   Problem: Education: Goal: Ability to describe self-care measures that may prevent or decrease complications (Diabetes Survival Skills Education) will improve Outcome: Progressing Goal: Individualized Educational Video(s) Outcome: Progressing   Problem: Coping: Goal: Ability to adjust to condition or change in health will improve Outcome: Progressing   Problem: Fluid Volume: Goal: Ability to  maintain a balanced intake and output will improve Outcome: Progressing   Problem: Health Behavior/Discharge Planning: Goal: Ability to identify and utilize available resources and services will improve Outcome: Progressing Goal: Ability to manage health-related needs will improve Outcome: Progressing   Problem: Metabolic: Goal: Ability to maintain appropriate glucose levels will improve Outcome: Progressing   Problem: Nutritional: Goal: Maintenance of adequate nutrition will improve Outcome: Progressing Goal: Progress toward achieving an optimal weight will improve Outcome: Progressing   Problem: Skin Integrity: Goal: Risk for impaired skin integrity will decrease Outcome: Progressing   Problem: Tissue Perfusion: Goal: Adequacy of tissue perfusion will improve Outcome: Progressing   Problem: Education: Goal: Knowledge of risk factors and measures for prevention of condition will improve Outcome: Progressing   Problem: Coping: Goal: Psychosocial and spiritual needs will be supported Outcome: Progressing   Problem: Respiratory: Goal: Will maintain a patent airway Outcome: Progressing Goal: Complications related to the disease process, condition or treatment will be avoided or minimized Outcome: Progressing   Problem: Education: Goal: Knowledge of disease or condition will improve Outcome: Progressing Goal: Understanding of medication regimen will improve Outcome: Progressing Goal: Individualized Educational Video(s) Outcome: Progressing   Problem: Activity: Goal: Ability to tolerate increased activity will improve Outcome: Progressing   Problem: Cardiac: Goal: Ability to achieve and maintain adequate cardiopulmonary perfusion will improve Outcome: Progressing   Problem: Health Behavior/Discharge Planning: Goal: Ability to safely manage health-related needs after discharge will improve Outcome: Progressing

## 2023-01-19 NOTE — Evaluation (Signed)
Physical Therapy Evaluation Patient Details Name: Russell Bowers MRN: [id number] DOB: Oct 19, 1951 Today's Date: 01/19/2023  History of Present Illness  Pt is a 72 y/o M presenting to ED on 1/23 with cough and SOB with exertion, found to be hypoxic at 78% on RA, COVID positive. Admitted for acute respiratory failure with hypoxia and hypercapnia and AKI. HD initiated 01/18/23. PMH includes chronic diastolic heart failure, DM2, ESRD with renal transplant on tacrolimus, aortic stenosis, OSA On BiPAP.  Clinical Impression  PTA, pt lives with his spouse and is independent. Pt presents with decreased cardiopulmonary endurance and static/dynamic balance deficits. Pt overall is eager and motivated to mobilize. Able to participate in seated warm up exercise and ambulating 500 ft with a walker at a supervision level. SpO2 90% on 6L O2. Will continue to progress as tolerated.      Recommendations for follow up therapy are one component of a multi-disciplinary discharge planning process, led by the attending physician.  Recommendations may be updated based on patient status, additional functional criteria and insurance authorization.  Follow Up Recommendations Home health PT      Assistance Recommended at Discharge PRN  Patient can return home with the following  A little help with walking and/or transfers;A little help with bathing/dressing/bathroom;Assistance with cooking/housework;Assist for transportation;Help with stairs or ramp for entrance    Equipment Recommendations Rolling walker (2 wheels)  Recommendations for Other Services       Functional Status Assessment Patient has had a recent decline in their functional status and demonstrates the ability to make significant improvements in function in a reasonable and predictable amount of time.     Precautions / Restrictions Precautions Precautions: Fall Restrictions Weight Bearing Restrictions: No      Mobility  Bed Mobility                General bed mobility comments: OOB in chair    Transfers Overall transfer level: Needs assistance Equipment used: None Transfers: Sit to/from Stand Sit to Stand: Supervision           General transfer comment: decreased eccentric control    Ambulation/Gait Ambulation/Gait assistance: Supervision Gait Distance (Feet): 500 Feet Assistive device: Rolling walker (2 wheels) Gait Pattern/deviations: Step-through pattern, Decreased stride length Gait velocity: decreased Gait velocity interpretation: <1.8 ft/sec, indicate of risk for recurrent falls   General Gait Details: slow and steady pace, cues for activity pacing  Stairs            Wheelchair Mobility    Modified Rankin (Stroke Patients Only)       Balance Overall balance assessment: Needs assistance Sitting-balance support: Bilateral upper extremity supported, Feet supported Sitting balance-Leahy Scale: Good     Standing balance support: During functional activity, No upper extremity supported Standing balance-Leahy Scale: Fair Standing balance comment: mild LOB, reaching out for external support minimally                             Pertinent Vitals/Pain Pain Assessment Pain Assessment: No/denies pain    Home Living Family/patient expects to be discharged to:: Private residence Living Arrangements: Spouse/significant other Available Help at Discharge: Family;Available 24 hours/day Type of Home: House Home Access: Stairs to enter Entrance Stairs-Rails: Can reach both Entrance Stairs-Number of Steps: 3   Home Layout: One level Home Equipment: Shower seat - built in;Grab bars - tub/shower Additional Comments: wears Bipap at night    Prior Function Prior Level of Function :  Independent/Modified Independent             Mobility Comments: no AD ADLs Comments: ind with ADLs     Hand Dominance   Dominant Hand: Right    Extremity/Trunk Assessment   Upper Extremity  Assessment Upper Extremity Assessment: Overall WFL for tasks assessed    Lower Extremity Assessment Lower Extremity Assessment: Overall WFL for tasks assessed    Cervical / Trunk Assessment Cervical / Trunk Assessment: Normal  Communication   Communication: HOH  Cognition Arousal/Alertness: Awake/alert Behavior During Therapy: WFL for tasks assessed/performed Overall Cognitive Status: Within Functional Limits for tasks assessed                                          General Comments      Exercises General Exercises - Lower Extremity Long Arc Quad: Both, 10 reps, Seated Hip Flexion/Marching: Both, 10 reps, Seated   Assessment/Plan    PT Assessment Patient needs continued PT services  PT Problem List Decreased strength;Decreased activity tolerance;Decreased balance;Decreased mobility       PT Treatment Interventions DME instruction;Gait training;Stair training;Functional mobility training;Therapeutic activities;Therapeutic exercise;Balance training;Patient/family education    PT Goals (Current goals can be found in the Care Plan section)  Acute Rehab PT Goals Patient Stated Goal: to walk PT Goal Formulation: With patient Time For Goal Achievement: 02/02/23 Potential to Achieve Goals: Good    Frequency Min 3X/week     Co-evaluation               AM-PAC PT "6 Clicks" Mobility  Outcome Measure Help needed turning from your back to your side while in a flat bed without using bedrails?: None Help needed moving from lying on your back to sitting on the side of a flat bed without using bedrails?: A Little Help needed moving to and from a bed to a chair (including a wheelchair)?: A Little Help needed standing up from a chair using your arms (e.g., wheelchair or bedside chair)?: A Little Help needed to walk in hospital room?: A Little Help needed climbing 3-5 steps with a railing? : A Little 6 Click Score: 19    End of Session Equipment  Utilized During Treatment: Oxygen Activity Tolerance: Patient tolerated treatment well Patient left: in chair;with call bell/phone within reach;with family/visitor present Nurse Communication: Mobility status PT Visit Diagnosis: Unsteadiness on feet (R26.81);Difficulty in walking, not elsewhere classified (R26.2)    Time: 1517-6160 PT Time Calculation (min) (ACUTE ONLY): 15 min   Charges:   PT Evaluation $PT Eval Low Complexity: [place], PT, DPT Acute Rehabilitation Services Office [phone]   Deno Etienne 01/19/2023, 12:48 PM

## 2023-01-19 NOTE — Progress Notes (Addendum)
[facility] Progress Note  Subjective: UOP low < 100 cc yesterday. Had HD yest w/ 2.6 L net UF, BP's soft today.   Vitals:   01/19/23 1140 01/19/23 1200 01/19/23 1300 01/19/23 1400  BP: (!) 97/56 (!) 94/55 (!) 79/52 (!) 83/54  Pulse: 73 65 63   Resp: 17 17 (!) 26 20  Temp: (!) 97.5 F (36.4 C)     TempSrc: Oral     SpO2: 99%     Weight:      Height:        Exam: Gen alert, no distress +JVD to angle of jaw Chest dec'd L base, bilat rales 1/3 up RRR no MRG Abd soft ntnd no mass or ascites +bs Ext mild pretib/ pedal edema Neuro is alert, Ox 3 , nf, no asterixis    LUA AVF +bruit     Home meds include - allopurinol, sensipar 30 qd, rocaltrol 0.25 mcg qd, pepcid, lasix 40 qd, glipizide, insulin glargine, toprol xl 50, pravachol, prograf '2mg'$  am and '3mg'$  pm, prns/ vits/ supps       Date                          Creat               eGFR    2014                         2.1- 2.7    2015                         1.5- 2.1    Feb- aug 2022         3.0- 3.5            18- 21    Sept 2022                 3.65                 17 ml/min    Sept 2023                 4.09                 15 ml/min    01/14/23                     5.37                     01/15/23                     5.27                 11 ml/min     CXR 1/23 - IMPRESSION: 1. Reticulonodular opacities bilaterally, which may reflect atypical/viral infection.   CR chest noncont - Lungs/Pleura: Central airways are patent. Bibasilar linear opacities similar to prior exam and likely due to scarring. Small bilateral pleural effusions    CXR 1/27 - marked worsening of bilat effusions and pulm edema    UCr 134, UNa < 10    UA - prot 100, many bact, 0-5 rbc, >50 wbc    Renal US transplant - normal [place], no hydro, nonspecific elevated intrarenal resistive indices.      Assessment/ Plan: AKI on CKD 5T - b/l creat 4.1 from sept 2023, eGFR 15 ml/min.  Creat here 5.2 on admission in the setting of 1 month coughing, poor po  intake and newly + here for COVID infection. BP's were low, CT w/o edema, CXR w/o edema. UA +wbcs, renal US unremarkable. Mild pedal edema on initial exam. Viral pna is likely given hypoxia. AKI felt to be intravasc vol depletion vs vol overload vs progression of CKD.  Tried cautious IVF's w/ no improvement in B/Cr or UOP, then tried IV lasix + albumin also w/o good response. B/Cr continue to rise and pt more lethargic and confused c/w uremia. Had 1st HD 1/27 yesterday, plan another HD tomorrow and possibly also tonight. Will follow. Suspect this is likely ESRD.  Pulm edema - w/ bilat rales, CXR 1/27 sig worse --> cont to lower vol w/ HD Renal transplant - done in 2012, w/ function slowly failing. He was CKD stage 5 in sept 2023. Taking only prograf at '2mg'$  am and '3mg'$  pm. Morning dose dc'd now due to high level 9.1 (3.0-8.0).  BP - BP's normal to low, will start midodrine 10 tid COVID infection - suspected covid pna, per pmd Atrial fib - seen by cards DM2 per pmd     Russell Bowers 01/19/2023, 2:47 PM   Recent Labs  Lab 01/18/23 0032 01/19/23 0045  HGB 12.8* 11.7*  ALBUMIN 4.1 3.8  CALCIUM 8.0* 8.1*  PHOS 7.8* 5.6*  CREATININE 6.53* 4.75*  K 4.5 3.2*    Recent Labs  Lab 01/15/23 0031  FERRITIN 367*    Inpatient medications:  allopurinol  100 mg Oral Daily   apixaban  2.5 mg Oral BID   calcitRIOL  0.25 mcg Oral Daily   Chlorhexidine Gluconate Cloth  6 each Topical Q0600   cinacalcet  30 mg Oral Q48H   docusate sodium  100 mg Oral BID   [START ON 01/20/2023] famotidine  10 mg Oral QODAY   folic acid  2 mg Oral Daily   insulin aspart  0-15 Units Subcutaneous TID WC   insulin aspart  0-5 Units Subcutaneous QHS   insulin glargine-yfgn  12 Units Subcutaneous BID   loratadine  10 mg Oral Daily   mouth rinse  15 mL Mouth Rinse 4 times per day   pravastatin  10 mg Oral QHS   predniSONE  20 mg Oral Daily   sodium chloride flush  3 mL Intravenous Q12H   tacrolimus  3 mg Oral QHS     sodium chloride 50 mL (01/19/23 1222)   sodium chloride, acetaminophen **OR** acetaminophen, chlorpheniramine-HYDROcodone, guaiFENesin-dextromethorphan, heparin, HYDROcodone-acetaminophen, ipratropium, metoprolol tartrate, mouth rinse, polyethylene glycol, sodium chloride flush

## 2023-01-19 NOTE — Progress Notes (Addendum)
[facility]                                                                              Russell Bowers, is a 72 y.o. male, DOB - 11-30-1951, FBP:[id number] Admit date - 01/14/2023    Outpatient Primary MD for the patient is [name], MD  LOS - 5  days  Chief Complaint  Patient presents with   Fatigue   Cough       Brief summary   Patient is a 72year old male with DM2, OSA on BiPAP, diastolic CHF, Renal transplant due to ESRD secondary to IgA nephropathy s/p DDKT on 12/01/2011, recurent UTI, history of prostate cancer s/p radical prostatectomy in 2011 presented with shortness of breath, cough and SOB for the past 1 week. No fever no chills. Uses BIpap for sleep at baseline, No history of PE/DVT.  He was found to be COVID-positive with evidence of fluid overload, he also went into AKI with suspicion for kidney transplant rejection.  He was admitted to [facility] and subsequently transferred to [facility] for further care.  He was transferred to my care on 01/18/2022 on day 4 of his hospital stay.   Assessment & Plan    Acute respiratory failure with hypoxia and hypercapnia (HCC) 2 fluid overload with acute on chronic diastolic CHF EF 82%, worsening renal function. -His main issue appears to be volume overload, renal function is declining, nephrology is following the patient and assisting with diuresis and fluid removal, COVID-19 infection appears incidental.  Taper off IV antibiotics as there is no bacterial infection, with initiation of HD on 01/18/2023 and fluid removal hypoxia much improved, tapering oxygen, uses nighttime BiPAP daytime oxygen, continue to advance activity and titrate down oxygen as needed, added flutter valve and I-S for pulmonary toiletry encouraged to sit in chair in the daytime.  Pneumonia due to COVID-19 virus  -Mild with stable CRP, low-dose steroids.  Suspected atrial fibrillation - was ruled out earlier in admission however on  01/18/2023 EKG suspicious for atrial fibrillation confirmed by cardiologist Dr. Sanda Klein on 01/19/2023, will place on low-dose beta-blocker cleared by cardiology to do so, also [place] vasc 2 score greater than 3 initiate Eliquis.  Thrombocytopenia acute on chronic.  Monitor closely, stop heparin, Eliquis at lower dose for A-fib. Check HIT.  Acute on chronic heart failure with preserved ejection fraction (HFpEF) ([place]), mild AR, AS.  EF 60 to 65%  -Etiology and nephrology both following and managing diuresis.  HD initiated on 01/18/2023 with much improvement in his fluid overload.  Acute kidney injury, superimposed on CKD5, history of renal transplantation question now developing ESRD. -Creatinine 4.09 on 08/25/2022, on steroids and transplant medications, nephrology following closely.  HD initiated by nephrology on 01/18/2023.  Hyperkalemia -Continue Lokelma scheduled  Severe sepsis secondary to COVID-19, pneumonia, ?urinary tract infection - -Met sepsis criteria on admission with tachypnea, tachycardia, respiratory failure, AKI, source likely UTI, COVID-pneumonia, has history of frequent UTIs, urine culture showed no growth  Mildly high D Dimer - likely due to mild Covid, Leg [place] stable, on Hep - monitor.  OSA (obstructive sleep apnea)   -  continue BiPAP qhs, stable, VBG's unremarkable.  Anemia due to chronic kidney disease  - H&H stable  DM2 (diabetes mellitus, type 2) ([place]), uncontrolled with hyperglycemia, IDDM - HbA1C 7.7, Insulin dose adjusted on 01/19/2023 as steroids being tapered rapidly  CBG (last 3)  Recent Labs    01/18/23 2110 01/19/23 0613 01/19/23 0655  GLUCAP 152* 61* 73    Estimated body mass index is 29.69 kg/m as calculated from the following:   Height as of this encounter: '5\' 9"'$  (5.176 m).   Weight as of this encounter: 91.2 kg.  Code Status: full  DVT Prophylaxis:  heparin injection 5,000 Units Start: 01/15/23 2200 heparin gtt >> Eliquis  Level of Care: Level of  care: Progressive Family Communication: Updated patient's wife at the bedside in detail on 01/18/2023, 01/19/2023 Disposition Plan:      Remains inpatient appropriate: Transfer to [facility] pending for closer monitoring and possibly needing HD in near future (has AV fistula)  Procedures:  Bipap QHS  Consultants:   Cardiology Nephrology CCM    Medications  allopurinol  100 mg Oral Daily   calcitRIOL  0.25 mcg Oral Daily   Chlorhexidine Gluconate Cloth  6 each Topical Q0600   cinacalcet  30 mg Oral Q48H   docusate sodium  100 mg Oral BID   [START ON 01/20/2023] famotidine  10 mg Oral QODAY   folic acid  2 mg Oral Daily   heparin injection (subcutaneous)  5,000 Units Subcutaneous Q8H   insulin aspart  0-15 Units Subcutaneous TID WC   insulin aspart  0-5 Units Subcutaneous QHS   insulin glargine-yfgn  12 Units Subcutaneous BID   loratadine  10 mg Oral Daily   metoprolol tartrate  25 mg Oral BID   mouth rinse  15 mL Mouth Rinse 4 times per day   pravastatin  10 mg Oral QHS   predniSONE  20 mg Oral Daily   tacrolimus  3 mg Oral QHS      Subjective:   Patient in bed, appears comfortable, denies any headache, no fever, no chest pain or pressure, no shortness of breath , no abdominal pain. No new focal weakness.   Objective:   Vitals:   01/19/23 0015 01/19/23 0349 01/19/23 0445 01/19/23 0500  BP: 97/60  92/62   Pulse: 87 86 95   Resp: 19 20 (!) 22   Temp:      TempSrc:      SpO2: 95% 97% 97%   Weight:    91.2 kg  Height:        Intake/Output Summary (Last 24 hours) at 01/19/2023 0850 Last data filed at 01/18/2023 1738 Gross per 24 hour  Intake --  Output 2500 ml  Net -2500 ml     Wt Readings from Last 3 Encounters:  01/19/23 91.2 kg  04/25/22 81.7 kg  07/26/21 79.4 kg   Physical Exam  Awake Alert, No new F.N deficits, Normal affect [place].AT,PERRAL Supple Neck, No JVD,   Symmetrical Chest wall movement, Good air movement bilaterally, few rales iRRR,No  Gallops, Rubs or new Murmurs,  +ve B.Sounds, Abd Soft, No tenderness,   No Cyanosis, Clubbing or edema     Data Reviewed:  I have personally reviewed following labs   Recent Labs  Lab 01/16/23 0322 01/17/23 0316 01/17/23 1844 01/18/23 0032 01/19/23 0045  WBC 3.5* 7.1 8.7 8.0 6.2  HGB 12.3* 12.6* 13.3 12.8* 11.7*  HCT 41.9 43.4 45.5 42.9 38.1*  PLT 123* 116* 111* 96* 70*  MCV 106.3* 108.2* 108.1* 105.9* 103.5*  MCH 31.2 31.4 31.6 31.6 31.8  MCHC 29.4* 29.0* 29.2* 29.8* 30.7  RDW 16.1* 16.2* 16.4* 16.4* 15.9*  LYMPHSABS 0.3* 0.3* 0.2* 0.1* 0.2*  MONOABS 0.1 0.2 0.4 0.2 0.4  EOSABS 0.0 0.0 0.0 0.0 0.0  BASOSABS 0.0 0.0 0.0 0.0 0.0    Recent Labs  Lab 01/14/23 1249 01/14/23 1319 01/14/23 1420 01/15/23 0042 01/15/23 0112 01/16/23 0322 01/16/23 1359 01/17/23 0316 01/18/23 0032 01/19/23 0045  NA 136   < >  --  135  --  136 137 135 137 135  K 4.9   < >  --  6.0*  --  6.3* 5.3* 5.2* 4.5 3.2*  CL 100  --   --  100  --  103 99 102 100 98  CO2 27  --   --  24  --  '22 22 22 25 28  '$ ANIONGAP 9  --   --  11  --  11 16* '11 12 9  '$ GLUCOSE 192*  --   --  182*  --  323* 316* 294* 297* 99  BUN 81*  --   --  76*  --  112* 103* 109* 121* 68*  CREATININE 5.37*  --   --  5.27*  --  5.92* 6.34* 6.29* 6.53* 4.75*  AST 11*  --   --  12*  --  10*  --  10* 16 14*  ALT 12  --   --  14  --  12  --  '13 12 14  '$ ALKPHOS 74  --   --  69  --  65  --  60 61 45  BILITOT 0.7  --   --  0.7  --  0.4  --  0.6 0.5 0.5  ALBUMIN 4.0  --   --  3.3*  --  3.1*  --  3.7 4.1 3.8  CRP  --   --   --  1.0*  --  0.6  --  0.5 <0.5 <0.5  DDIMER  --   --   --   --  0.68* 0.55*  --  0.77* 1.60* 1.36*  PROCALCITON  --   --   --  0.16  --  0.25  --   --   --   --   LATICACIDVEN  --   --  0.7  --   --   --   --   --   --   --   INR  --   --   --   --  1.2  --   --   --   --   --   TSH  --   --   --  1.077  --   --   --   --   --   --   HGBA1C  --   --   --  7.7*  --   --   --   --   --   --   BNP 1,704.0*  --   --   --    --   --   --   --  1,317.7* 811.0*  MG  --   --   --  2.8*  --  2.9*  --  2.7* 2.8* 2.3  CALCIUM 9.8  --   --  8.5*  --  8.1* 8.4* 8.1* 8.0* 8.1*   < > = values in this interval not displayed.  Radiology Studies: I have personally reviewed the imaging studies  DG Chest Port 1 View  Result Date: 01/18/2023 CLINICAL DATA:  Shortness of breath EXAM: PORTABLE CHEST 1 VIEW COMPARISON:  None Available. FINDINGS: Cardiomegaly. The hila and mediastinum are normal. Diffuse interstitial opacities in the lungs have worsened. Bilateral pleural effusions with underlying atelectasis are again identified. No other acute abnormalities. IMPRESSION: Findings are most consistent with cardiomegaly, pleural effusions, and worsening pulmonary edema. Electronically Signed   By: Dorise Bullion III M.D.   On: 01/18/2023 12:12   VAS [place] LOWER EXTREMITY VENOUS (DVT)  Result Date: 01/18/2023  Lower Venous DVT Study Patient Name:  Russell Bowers  Date of Exam:   01/18/2023 Medical Rec #: [id number]           Accession #:    [id number] Date of Birth: 1951-12-08            Patient Gender: M Patient Age:   70 years Exam Location:  [facility] Procedure:      VAS [place] LOWER EXTREMITY VENOUS (DVT) Referring Phys: [facility] --------------------------------------------------------------------------------  Indications: Elevated ddimer.  Comparison Study: no prior Performing Technologist: Archie Patten RVS  Examination Guidelines: A complete evaluation includes B-mode imaging, spectral Doppler, color Doppler, and power Doppler as needed of all accessible portions of each vessel. Bilateral testing is considered an integral part of a complete examination. Limited examinations for reoccurring indications may be performed as noted. The reflux portion of the exam is performed with the patient in reverse Trendelenburg.  +---------+---------------+---------+-----------+----------+--------------+ RIGHT     CompressibilityPhasicitySpontaneityPropertiesThrombus Aging +---------+---------------+---------+-----------+----------+--------------+ CFV      Full           Yes      Yes                                 +---------+---------------+---------+-----------+----------+--------------+ SFJ      Full                                                        +---------+---------------+---------+-----------+----------+--------------+ FV Prox  Full                                                        +---------+---------------+---------+-----------+----------+--------------+ FV Mid   Full                                                        +---------+---------------+---------+-----------+----------+--------------+ FV DistalFull                                                        +---------+---------------+---------+-----------+----------+--------------+ PFV      Full                                                        +---------+---------------+---------+-----------+----------+--------------+  POP      Full           Yes      Yes                                 +---------+---------------+---------+-----------+----------+--------------+ PTV      Full                                                        +---------+---------------+---------+-----------+----------+--------------+ PERO     Full                                                        +---------+---------------+---------+-----------+----------+--------------+   +---------+---------------+---------+-----------+----------+-------------------+ LEFT     CompressibilityPhasicitySpontaneityPropertiesThrombus Aging      +---------+---------------+---------+-----------+----------+-------------------+ CFV      Full           Yes      Yes                                      +---------+---------------+---------+-----------+----------+-------------------+ SFJ      Full                                                              +---------+---------------+---------+-----------+----------+-------------------+ FV Prox  Full                                                             +---------+---------------+---------+-----------+----------+-------------------+ FV Mid   Full                                                             +---------+---------------+---------+-----------+----------+-------------------+ FV DistalFull                                                             +---------+---------------+---------+-----------+----------+-------------------+ PFV      Full                                                             +---------+---------------+---------+-----------+----------+-------------------+ POP      Full  Yes      Yes                                      +---------+---------------+---------+-----------+----------+-------------------+ PTV      Full                                                             +---------+---------------+---------+-----------+----------+-------------------+ PERO                                                  Not well visualized +---------+---------------+---------+-----------+----------+-------------------+     Summary: BILATERAL: - No evidence of deep vein thrombosis seen in the lower extremities, bilaterally. -No evidence of popliteal cyst, bilaterally.   *See table(s) above for measurements and observations.    Preliminary     Signature  -    Lala Lund M.D on 01/19/2023 at 8:50 AM   -  To page go to [email or web address]

## 2023-01-20 DIAGNOSIS — J9601 Acute respiratory failure with hypoxia: Secondary | ICD-10-CM | POA: Diagnosis not present

## 2023-01-20 DIAGNOSIS — J9602 Acute respiratory failure with hypercapnia: Secondary | ICD-10-CM | POA: Diagnosis not present

## 2023-01-20 LAB — COMPREHENSIVE METABOLIC PANEL
ALT: 37 U/L (ref 0–44)
AST: 29 U/L (ref 15–41)
Albumin: 4.2 g/dL (ref 3.5–5.0)
Alkaline Phosphatase: 55 U/L (ref 38–126)
Anion gap: 9 (ref 5–15)
BUN: 34 mg/dL — ABNORMAL HIGH (ref 8–23)
CO2: 28 mmol/L (ref 22–32)
Calcium: 8.2 mg/dL — ABNORMAL LOW (ref 8.9–10.3)
Chloride: 96 mmol/L — ABNORMAL LOW (ref 98–111)
Creatinine, Ser: 3.03 mg/dL — ABNORMAL HIGH (ref 0.61–1.24)
GFR, Estimated: 21 mL/min — ABNORMAL LOW (ref >60)
Glucose, Bld: 121 mg/dL — ABNORMAL HIGH (ref 70–99)
Potassium: 3.5 mmol/L (ref 3.5–5.1)
Sodium: 133 mmol/L — ABNORMAL LOW (ref 135–145)
Total Bilirubin: 0.8 mg/dL (ref 0.3–1.2)
Total Protein: 6.5 g/dL (ref 6.5–8.1)

## 2023-01-20 LAB — CBC WITH DIFFERENTIAL/PLATELET
Abs Immature Granulocytes: 0.33 10*3/uL — ABNORMAL HIGH (ref 0.00–0.07)
Basophils Absolute: 0.1 10*3/uL (ref 0.0–0.1)
Basophils Relative: 1 %
Eosinophils Absolute: 0 10*3/uL (ref 0.0–0.5)
Eosinophils Relative: 0 %
HCT: 44.7 % (ref 39.0–52.0)
Hemoglobin: 13.7 g/dL (ref 13.0–17.0)
Immature Granulocytes: 3 %
Lymphocytes Relative: 3 %
Lymphs Abs: 0.3 10*3/uL — ABNORMAL LOW (ref 0.7–4.0)
MCH: 31.7 pg (ref 26.0–34.0)
MCHC: 30.6 g/dL (ref 30.0–36.0)
MCV: 103.5 fL — ABNORMAL HIGH (ref 80.0–100.0)
Monocytes Absolute: 1 10*3/uL (ref 0.1–1.0)
Monocytes Relative: 10 %
Neutro Abs: 8.2 10*3/uL — ABNORMAL HIGH (ref 1.7–7.7)
Neutrophils Relative %: 83 %
Platelets: 75 10*3/uL — ABNORMAL LOW (ref 150–400)
RBC: 4.32 MIL/uL (ref 4.22–5.81)
RDW: 15.8 % — ABNORMAL HIGH (ref 11.5–15.5)
WBC: 9.8 10*3/uL (ref 4.0–10.5)
nRBC: 0.3 % — ABNORMAL HIGH (ref 0.0–0.2)

## 2023-01-20 LAB — D-DIMER, QUANTITATIVE: D-Dimer, Quant: 2.79 ug/mL-FEU — ABNORMAL HIGH (ref 0.00–0.50)

## 2023-01-20 LAB — HEPATITIS B SURFACE ANTIBODY, QUANTITATIVE: Hep B S AB Quant (Post): 7.9 m[IU]/mL — ABNORMAL LOW (ref >9.9)

## 2023-01-20 LAB — GLUCOSE, CAPILLARY
Glucose-Capillary: 97 mg/dL (ref 70–99)
Glucose-Capillary: 59 mg/dL — ABNORMAL LOW (ref 70–99)
Glucose-Capillary: 64 mg/dL — ABNORMAL LOW (ref 70–99)
Glucose-Capillary: 89 mg/dL (ref 70–99)
Glucose-Capillary: 130 mg/dL — ABNORMAL HIGH (ref 70–99)
Glucose-Capillary: 118 mg/dL — ABNORMAL HIGH (ref 70–99)

## 2023-01-20 LAB — BRAIN NATRIURETIC PEPTIDE: B Natriuretic Peptide: 888.3 pg/mL — ABNORMAL HIGH (ref 0.0–100.0)

## 2023-01-20 LAB — MAGNESIUM: Magnesium: 1.8 mg/dL (ref 1.7–2.4)

## 2023-01-20 LAB — C-REACTIVE PROTEIN: CRP: <0.5 mg/dL (ref <1.0)

## 2023-01-20 LAB — HEPARIN INDUCED PLATELET AB (HIT ANTIBODY): Heparin Induced Plt Ab: 0.062 OD (ref 0.000–0.400)

## 2023-01-20 LAB — PHOSPHORUS: Phosphorus: 3.6 mg/dL (ref 2.5–4.6)

## 2023-01-20 MED ORDER — INSULIN ASPART 100 UNIT/ML IJ SOLN
0.0000 [IU] | Freq: Three times a day (TID) | INTRAMUSCULAR | Status: DC
  Administered 2023-01-20 – 2023-01-22 (×2): 1 [IU] via SUBCUTANEOUS
  Administered 2023-01-22: 2 [IU] via SUBCUTANEOUS

## 2023-01-20 MED ORDER — TACROLIMUS 1 MG PO CAPS
2.0000 mg | ORAL_CAPSULE | Freq: Every day | ORAL | Status: DC
  Filled 2023-01-20: qty 2

## 2023-01-20 MED ORDER — TACROLIMUS 1 MG PO CAPS
2.0000 mg | ORAL_CAPSULE | Freq: Two times a day (BID) | ORAL | Status: DC
  Administered 2023-01-20 – 2023-01-25 (×9): 2 mg via ORAL
  Filled 2023-01-20 (×13): qty 2

## 2023-01-20 MED ORDER — INSULIN GLARGINE-YFGN 100 UNIT/ML ~~LOC~~ SOLN
12.0000 [IU] | Freq: Every day | SUBCUTANEOUS | Status: DC
  Filled 2023-01-20: qty 0.12

## 2023-01-20 NOTE — Care Management Important Message (Signed)
Important Message  Patient Details  Name: DERIC BOCOCK MRN: [id number] Date of Birth: 1951-12-12   Medicare Important Message Given:  Yes     [name] 01/20/2023, 10:15 AM

## 2023-01-20 NOTE — Progress Notes (Signed)
OT Cancellation Note  Patient Details Name: Russell Bowers MRN: [id number] DOB: [birth year]   Cancelled Treatment:    Reason Eval/Treat Not Completed: Other (comment) (pt HR 100-110 with vtach noted on monitor upon arrival, pt awake/alert/responsive and spouse in room, RN notified and NSG in room, will follow up for OT as appropriate.)  Renaye Rakers, OTD, OTR/L [employer] [facility] [phone] - [place] 01/20/2023, 2:24 PM

## 2023-01-20 NOTE — Plan of Care (Signed)
  Problem: Education: Goal: Knowledge of General Education information will improve Description: Including pain rating scale, medication(s)/side effects and non-pharmacologic comfort measures Outcome: Progressing   Problem: Health Behavior/Discharge Planning: Goal: Ability to manage health-related needs will improve Outcome: Progressing   Problem: Clinical Measurements: Goal: Ability to maintain clinical measurements within normal limits will improve Outcome: Progressing Goal: Will remain free from infection Outcome: Progressing Goal: Diagnostic test results will improve Outcome: Progressing Goal: Respiratory complications will improve Outcome: Progressing Goal: Cardiovascular complication will be avoided Outcome: Progressing   Problem: Activity: Goal: Risk for activity intolerance will decrease Outcome: Progressing   Problem: Nutrition: Goal: Adequate nutrition will be maintained Outcome: Progressing   Problem: Coping: Goal: Level of anxiety will decrease Outcome: Progressing   Problem: Elimination: Goal: Will not experience complications related to bowel motility Outcome: Progressing Goal: Will not experience complications related to urinary retention Outcome: Progressing   Problem: Pain Managment: Goal: General experience of comfort will improve Outcome: Progressing   Problem: Safety: Goal: Ability to remain free from injury will improve Outcome: Progressing   Problem: Skin Integrity: Goal: Risk for impaired skin integrity will decrease Outcome: Progressing   Problem: Education: Goal: Ability to describe self-care measures that may prevent or decrease complications (Diabetes Survival Skills Education) will improve Outcome: Progressing Goal: Individualized Educational Video(s) Outcome: Progressing   Problem: Coping: Goal: Ability to adjust to condition or change in health will improve Outcome: Progressing   Problem: Fluid Volume: Goal: Ability to  maintain a balanced intake and output will improve Outcome: Progressing   Problem: Health Behavior/Discharge Planning: Goal: Ability to identify and utilize available resources and services will improve Outcome: Progressing Goal: Ability to manage health-related needs will improve Outcome: Progressing   Problem: Metabolic: Goal: Ability to maintain appropriate glucose levels will improve Outcome: Progressing   Problem: Nutritional: Goal: Maintenance of adequate nutrition will improve Outcome: Progressing Goal: Progress toward achieving an optimal weight will improve Outcome: Progressing   Problem: Skin Integrity: Goal: Risk for impaired skin integrity will decrease Outcome: Progressing   Problem: Tissue Perfusion: Goal: Adequacy of tissue perfusion will improve Outcome: Progressing   Problem: Education: Goal: Knowledge of risk factors and measures for prevention of condition will improve Outcome: Progressing   Problem: Coping: Goal: Psychosocial and spiritual needs will be supported Outcome: Progressing   Problem: Respiratory: Goal: Will maintain a patent airway Outcome: Progressing Goal: Complications related to the disease process, condition or treatment will be avoided or minimized Outcome: Progressing   Problem: Education: Goal: Knowledge of disease or condition will improve Outcome: Progressing Goal: Understanding of medication regimen will improve Outcome: Progressing Goal: Individualized Educational Video(s) Outcome: Progressing   Problem: Activity: Goal: Ability to tolerate increased activity will improve Outcome: Progressing   Problem: Cardiac: Goal: Ability to achieve and maintain adequate cardiopulmonary perfusion will improve Outcome: Progressing   Problem: Health Behavior/Discharge Planning: Goal: Ability to safely manage health-related needs after discharge will improve Outcome: Progressing

## 2023-01-20 NOTE — Progress Notes (Signed)
[facility]                                                                              Russell Bowers, is a 72 y.o. male, DOB - 11-18-1951, ZRA:[id number] Admit date - 01/14/2023    Outpatient Primary MD for the patient is [name], MD  LOS - 6  days  Chief Complaint  Patient presents with   Fatigue   Cough       Brief summary   Patient is a 72year old male with DM2, OSA on BiPAP, diastolic CHF, Renal transplant due to ESRD secondary to IgA nephropathy s/p DDKT on 12/01/2011, recurent UTI, history of prostate cancer s/p radical prostatectomy in 2011 presented with shortness of breath, cough and SOB for the past 1 week. No fever no chills. Uses BIpap for sleep at baseline, No history of PE/DVT.  He was found to be COVID-positive with evidence of fluid overload, he also went into AKI with suspicion for kidney transplant rejection.  He was admitted to [facility] and subsequently transferred to [facility] for further care.  He was transferred to my care on 01/18/2022 on day 4 of his hospital stay.   Assessment & Plan    Acute respiratory failure with hypoxia and hypercapnia (HCC) 2 fluid overload with acute on chronic diastolic CHF EF 54%, worsening renal function. -His main issue appears to be volume overload, renal function is declining, nephrology is following the patient and assisting with diuresis and fluid removal, COVID-19 infection appears incidental.  Taper off IV antibiotics as there is no bacterial infection, with initiation of HD on 01/18/2023 and fluid removal hypoxia much improved, tapering oxygen, uses nighttime BiPAP daytime oxygen, continue to advance activity and titrate down oxygen as needed, added flutter valve and I-S for pulmonary toiletry encouraged to sit in chair in the daytime.  Pneumonia due to COVID-19 virus  -Mild with stable CRP, low-dose steroids.  Suspected atrial fibrillation - was ruled out earlier in admission however on  01/18/2023 EKG suspicious for atrial fibrillation confirmed by cardiologist Dr. Sanda Klein on 01/19/2023, will place on low-dose beta-blocker cleared by cardiology to do so, also [place] vasc 2 score greater than 3 initiate Eliquis.  Thrombocytopenia acute on chronic.  Monitor closely, stop heparin, Eliquis at lower dose for A-fib. Check HIT.  Acute on chronic heart failure with preserved ejection fraction (HFpEF) ([place]), mild AR, AS.  EF 60 to 65%  -Etiology and nephrology both following and managing diuresis.  HD initiated on 01/18/2023 with much improvement in his fluid overload.  Acute kidney injury, superimposed on CKD5, history of renal transplantation question now developing ESRD. -Creatinine 4.09 on 08/25/2022, on steroids and transplant medications, nephrology following closely.  HD initiated by nephrology on 01/18/2023.  Hyperkalemia -Continue Lokelma scheduled  Severe sepsis secondary to COVID-19, pneumonia, ?urinary tract infection - -Met sepsis criteria on admission with tachypnea, tachycardia, respiratory failure, AKI, source likely UTI, COVID-pneumonia, has history of frequent UTIs, urine culture showed no growth  Mildly high D Dimer - likely due to mild Covid, Leg [place] stable, on Hep - monitor.  OSA (obstructive sleep apnea)   -  continue BiPAP qhs, stable, VBG's unremarkable.  Anemia due to chronic kidney disease  - H&H stable  Some intermittent diplopia which has resolved.  According the patient ongoing for a few weeks.  No headache, no diplopia on exam, no focal deficits.  Continue to monitor, if stable outpatient follow-up with his ophthalmologist Dr. Carolynn Sayers, he does have history of retinal detachment in the past.    DM2 (diabetes mellitus, type 2) ([place]), uncontrolled with hyperglycemia, IDDM - HbA1C 7.7, Insulin dose adjusted on 01/19/2023 as steroids being tapered rapidly  CBG (last 3)  Recent Labs    01/19/23 1604 01/19/23 2052 01/20/23 0558  GLUCAP 176* 195* 97     Estimated body mass index is 29.01 kg/m as calculated from the following:   Height as of this encounter: '5\' 9"'$  (1.753 m).   Weight as of this encounter: 89.1 kg.  Code Status: full  DVT Prophylaxis:  apixaban (ELIQUIS) tablet 2.5 mg Start: 01/19/23 1000 heparin gtt >> Eliquis apixaban (ELIQUIS) tablet 2.5 mg  Level of Care: Level of care: Progressive Family Communication: Updated patient's wife at the bedside in detail on 01/18/2023, 01/19/2023 Disposition Plan:      Remains inpatient appropriate: Transfer to [facility] pending for closer monitoring and possibly needing HD in near future (has AV fistula)  Procedures:  Bipap QHS  Consultants:   Cardiology Nephrology CCM  Medications  allopurinol  100 mg Oral Daily   apixaban  2.5 mg Oral BID   calcitRIOL  0.25 mcg Oral Daily   Chlorhexidine Gluconate Cloth  6 each Topical Q0600   Chlorhexidine Gluconate Cloth  6 each Topical Q0600   Chlorhexidine Gluconate Cloth  6 each Topical Q0600   cinacalcet  30 mg Oral Q48H   docusate sodium  100 mg Oral BID   famotidine  10 mg Oral QODAY   folic acid  2 mg Oral Daily   insulin aspart  0-15 Units Subcutaneous TID WC   insulin aspart  0-5 Units Subcutaneous QHS   insulin glargine-yfgn  12 Units Subcutaneous BID   loratadine  10 mg Oral Daily   midodrine  10 mg Oral TID WC   mouth rinse  15 mL Mouth Rinse 4 times per day   pravastatin  10 mg Oral QHS   predniSONE  20 mg Oral Daily   sodium chloride flush  3 mL Intravenous Q12H   tacrolimus  3 mg Oral QHS      Subjective:   Patient in bed, appears comfortable, denies any headache, no fever, no chest pain or pressure, no shortness of breath , no abdominal pain. No focal weakness.   Objective:   Vitals:   01/20/23 0100 01/20/23 0600 01/20/23 0604 01/20/23 0605  BP: 117/62 111/60    Pulse: 81  87   Resp: 19  19   Temp: 98.6 F (37 C)     TempSrc: Oral     SpO2: 99%  97%   Weight:    89.1 kg  Height:         Intake/Output Summary (Last 24 hours) at 01/20/2023 0953 Last data filed at 01/20/2023 0100 Gross per 24 hour  Intake 613.56 ml  Output 2500 ml  Net -1886.44 ml     Wt Readings from Last 3 Encounters:  01/20/23 89.1 kg  04/25/22 81.7 kg  07/26/21 79.4 kg   Physical Exam  Awake Alert, No new F.N deficits, Normal affect [place].AT,PERRAL Supple Neck, No JVD,   Symmetrical Chest wall movement, Good air  movement bilaterally, CTAB RRR,No Gallops, Rubs or new Murmurs,  +ve B.Sounds, Abd Soft, No tenderness,   No Cyanosis, Clubbing or edema      Data Reviewed:  I have personally reviewed following labs   Recent Labs  Lab 01/17/23 0316 01/17/23 1844 01/18/23 0032 01/19/23 0045 01/20/23 0040  WBC 7.1 8.7 8.0 6.2 9.8  HGB 12.6* 13.3 12.8* 11.7* 13.7  HCT 43.4 45.5 42.9 38.1* 44.7  PLT 116* 111* 96* 70* 75*  MCV 108.2* 108.1* 105.9* 103.5* 103.5*  MCH 31.4 31.6 31.6 31.8 31.7  MCHC 29.0* 29.2* 29.8* 30.7 30.6  RDW 16.2* 16.4* 16.4* 15.9* 15.8*  LYMPHSABS 0.3* 0.2* 0.1* 0.2* 0.3*  MONOABS 0.2 0.4 0.2 0.4 1.0  EOSABS 0.0 0.0 0.0 0.0 0.0  BASOSABS 0.0 0.0 0.0 0.0 0.1    Recent Labs  Lab 01/14/23 1249 01/14/23 1249 01/14/23 1319 01/14/23 1420 01/15/23 0042 01/15/23 0042 01/15/23 0112 01/16/23 0322 01/16/23 1359 01/17/23 0316 01/18/23 0032 01/19/23 0045 01/20/23 0040  NA 136  --    < >  --  135  --   --  136 137 135 137 135 133*  K 4.9  --    < >  --  6.0*  --   --  6.3* 5.3* 5.2* 4.5 3.2* 3.5  CL 100  --   --   --  100  --   --  103 99 102 100 98 96*  CO2 27  --   --   --  24  --   --  '22 22 22 25 28 28  '$ ANIONGAP 9  --   --   --  11  --   --  11 16* '11 12 9 9  '$ GLUCOSE 192*  --   --   --  182*  --   --  323* 316* 294* 297* 99 121*  BUN 81*  --   --   --  76*  --   --  112* 103* 109* 121* 68* 34*  CREATININE 5.37*  --   --   --  5.27*  --   --  5.92* 6.34* 6.29* 6.53* 4.75* 3.03*  AST 11*  --   --   --  12*  --   --  10*  --  10* 16 14* 29  ALT 12  --   --   --   14  --   --  12  --  '13 12 14 '$ 37  ALKPHOS 74  --   --   --  69  --   --  65  --  60 61 45 55  BILITOT 0.7  --   --   --  0.7  --   --  0.4  --  0.6 0.5 0.5 0.8  ALBUMIN 4.0  --   --   --  3.3*  --   --  3.1*  --  3.7 4.1 3.8 4.2  CRP  --    < >  --   --  1.0*  --   --  0.6  --  0.5 <0.5 <0.5 <0.5  DDIMER  --   --   --   --   --    < > 0.68* 0.55*  --  0.77* 1.60* 1.36* 2.79*  PROCALCITON  --   --   --   --  0.16  --   --  0.25  --   --   --   --   --  LATICACIDVEN  --   --   --  0.7  --   --   --   --   --   --   --   --   --   INR  --   --   --   --   --   --  1.2  --   --   --   --   --   --   TSH  --   --   --   --  1.077  --   --   --   --   --   --  0.311*  --   HGBA1C  --   --   --   --  7.7*  --   --   --   --   --   --   --   --   BNP 1,704.0*  --   --   --   --   --   --   --   --   --  1,317.7* 811.0* 888.3*  MG  --    < >  --   --  2.8*  --   --  2.9*  --  2.7* 2.8* 2.3 1.8  CALCIUM 9.8  --   --   --  8.5*  --   --  8.1* 8.4* 8.1* 8.0* 8.1* 8.2*   < > = values in this interval not displayed.      Radiology Studies: I have personally reviewed the imaging studies  VAS [place] LOWER EXTREMITY VENOUS (DVT)  Result Date: 01/19/2023  Lower Venous DVT Study Patient Name:  Russell Bowers  Date of Exam:   01/18/2023 Medical Rec #: [id number]           Accession #:    [id number] Date of Birth: Aug 23, 1951            Patient Gender: M Patient Age:   23 years Exam Location:  [facility] Procedure:      VAS [place] LOWER EXTREMITY VENOUS (DVT) Referring Phys: Deno Etienne [facility] --------------------------------------------------------------------------------  Indications: Elevated ddimer.  Comparison Study: no prior Performing Technologist: Archie Patten RVS  Examination Guidelines: A complete evaluation includes B-mode imaging, spectral Doppler, color Doppler, and power Doppler as needed of all accessible portions of each vessel. Bilateral testing is considered an integral part of a complete  examination. Limited examinations for reoccurring indications may be performed as noted. The reflux portion of the exam is performed with the patient in reverse Trendelenburg.  +---------+---------------+---------+-----------+----------+--------------+ RIGHT    CompressibilityPhasicitySpontaneityPropertiesThrombus Aging +---------+---------------+---------+-----------+----------+--------------+ CFV      Full           Yes      Yes                                 +---------+---------------+---------+-----------+----------+--------------+ SFJ      Full                                                        +---------+---------------+---------+-----------+----------+--------------+ FV Prox  Full                                                        +---------+---------------+---------+-----------+----------+--------------+  FV Mid   Full                                                        +---------+---------------+---------+-----------+----------+--------------+ FV DistalFull                                                        +---------+---------------+---------+-----------+----------+--------------+ PFV      Full                                                        +---------+---------------+---------+-----------+----------+--------------+ POP      Full           Yes      Yes                                 +---------+---------------+---------+-----------+----------+--------------+ PTV      Full                                                        +---------+---------------+---------+-----------+----------+--------------+ PERO     Full                                                        +---------+---------------+---------+-----------+----------+--------------+   +---------+---------------+---------+-----------+----------+-------------------+ LEFT     CompressibilityPhasicitySpontaneityPropertiesThrombus Aging       +---------+---------------+---------+-----------+----------+-------------------+ CFV      Full           Yes      Yes                                      +---------+---------------+---------+-----------+----------+-------------------+ SFJ      Full                                                             +---------+---------------+---------+-----------+----------+-------------------+ FV Prox  Full                                                             +---------+---------------+---------+-----------+----------+-------------------+ FV Mid   Full                                                             +---------+---------------+---------+-----------+----------+-------------------+  FV DistalFull                                                             +---------+---------------+---------+-----------+----------+-------------------+ PFV      Full                                                             +---------+---------------+---------+-----------+----------+-------------------+ POP      Full           Yes      Yes                                      +---------+---------------+---------+-----------+----------+-------------------+ PTV      Full                                                             +---------+---------------+---------+-----------+----------+-------------------+ PERO                                                  Not well visualized +---------+---------------+---------+-----------+----------+-------------------+     Summary: BILATERAL: - No evidence of deep vein thrombosis seen in the lower extremities, bilaterally. -No evidence of popliteal cyst, bilaterally.   *See table(s) above for measurements and observations. Electronically signed by Servando Snare MD on 01/19/2023 at 10:16:55 AM.    Final     Signature  -    Lala Lund M.D on 01/20/2023 at 9:53 AM   -  To page go to [email or web address]

## 2023-01-20 NOTE — TOC Initial Note (Addendum)
Transition of Care [facility]) - Initial/Assessment Note    Patient Details  Name: Russell Bowers MRN: [id number] Date of Birth: 19-Jan-1951  Transition of Care [facility]) CM/SW Contact:    Zenon Mayo, RN Phone Number: 01/20/2023, 12:14 PM  Clinical Narrative:                 NCM spoke with wife who is at the bedside, offered choice for HHPT, she states [facility], but if they can not take referral , then she does not have a preference.  NCM made referral to [facility] with [facility] , she states they do not have the staff right now.  NCM made referral to [facility] with [facility]. Awaiting to hear back.  Patient will also need a rolling walker, wife is ok with [facility] supplying this for patient.  NCM made referral for rolling walker thru [facility].  Wife will transport patient home at dc. Per Tommi Rumps with [facility], he can take referral for HHPT, soc will begin 24 to 48 hrs post dc.  Expected Discharge Plan: [place] Barriers to Discharge: Continued Medical Work up   Patient Goals and CMS Choice Patient states their goals for this hospitalization and ongoing recovery are:: return home CMS Medicare.gov Compare Post Acute Care list provided to:: Patient Represenative (must comment) Choice offered to / list presented to : Spouse      Expected Discharge Plan and Services In-house Referral: NA Discharge Planning Services: CM Consult Post Acute Care Choice: [place] arrangements for the past 2 months: Single Family Home                 DME Arranged: Walker rolling DME Agency: [employer] Date DME Agency Contacted: 01/20/23 Time DME Agency Contacted: 1212 Representative spoke with at DME Agency: [facility] HH Arranged: PT [place]: [place] Date [place]: 01/20/23 Time HH Agency Contacted: 63 Representative spoke with at [place]: Tommi Rumps  Prior Living Arrangements/Services Living arrangements for the past 2 months: [place] Lives  with:: Spouse Patient language and need for interpreter reviewed:: Yes Do you feel safe going back to the place where you live?: Yes      Need for Family Participation in Patient Care: Yes (Comment) Care giver support system in place?: Yes (comment)   Criminal Activity/Legal Involvement Pertinent to Current Situation/Hospitalization: No - Comment as needed  Activities of Daily Living Home Assistive Devices/Equipment: CPAP ADL Screening (condition at time of admission) Patient's cognitive ability adequate to safely complete daily activities?: Yes Is the patient deaf or have difficulty hearing?: No Does the patient have difficulty seeing, even when wearing glasses/contacts?: No Does the patient have difficulty concentrating, remembering, or making decisions?: No Patient able to express need for assistance with ADLs?: Yes Does the patient have difficulty dressing or bathing?: No Independently performs ADLs?: Yes (appropriate for developmental age) Does the patient have difficulty walking or climbing stairs?: No Weakness of Legs: None Weakness of Arms/Hands: None  Permission Sought/Granted                  Emotional Assessment         Alcohol / Substance Use: Not Applicable Psych Involvement: No (comment)  Admission diagnosis:  AKI (acute kidney injury) ([place]) [N17.9] Acute respiratory failure with hypoxia and hypercarbia ([place]) [J96.01, J96.02] Urinary tract infection without hematuria, site unspecified [N39.0] Acute on chronic heart failure with preserved ejection fraction (HFpEF) ([place]) [I50.33] COVID-19 [U07.1] Patient Active Problem List   Diagnosis  Date Noted   A-fib ([place]) 01/15/2023   Pneumonia due to COVID-19 virus 01/15/2023   Sepsis secondary to UTI ([place]) 01/15/2023   Acute on chronic heart failure with preserved ejection fraction (HFpEF) ([place]) 01/14/2023   Aortic stenosis 04/25/2022   Anemia due to chronic kidney disease 02/17/2021   Pancytopenia ([place])  02/17/2021   Atelectasis 02/19/2019   OSA (obstructive sleep apnea) 03/16/2014   Chronic respiratory failure ([place]) 01/21/2014   Obesity 04/17/2013   Chronic diastolic heart failure ([place]) 04/13/2013   Hypokalemia 04/13/2013   Demand ischemia of myocardium 04/13/2013   Acute respiratory failure with hypoxia and hypercapnia ([place]) 04/11/2013   RBBB 04/11/2013   History of renal transplantation 04/11/2013   AKI (acute kidney injury) ([place]) 04/11/2013   Immunocompromised ([place]) 04/11/2013   DM2 (diabetes mellitus, type 2) ([place]) 04/11/2013   PCP:  [name], MD Pharmacy:   [id number] [place] [address]Phone: [phone]: [phone] [facility] [address]Phone: [phone]: [phone]    Social Determinants of Health (SDOH) Social History: SDOH Screenings   Tobacco Use: Medium Risk (01/14/2023)   SDOH Interventions:     Readmission Risk Interventions    01/20/2023   12:09 PM  Readmission Risk Prevention Plan  Transportation Screening Complete  PCP or Specialist Appt within 3-5 Days Complete  HRI or Home Care Consult Complete  Palliative Care Screening Not Applicable  Medication Review (RN Care Manager) Complete

## 2023-01-20 NOTE — Progress Notes (Signed)
[facility] Progress Note  Assessment/ Plan: AKI on CKD 5T - b/l creat 4.1 from sept 2023, eGFR 15 ml/min. Creat here 5.2 on admission in the setting of 1 month coughing, poor po intake and newly + here for COVID infection. BP's were low, CT w/o edema, CXR w/o edema. UA +wbcs, renal US unremarkable. Mild pedal edema on initial exam. Viral pna is likely given hypoxia. AKI felt to be intravasc vol depletion vs vol overload vs progression of CKD.  Tried cautious IVF's w/ no improvement in B/Cr or UOP, then tried IV lasix + albumin also w/o good response. B/Cr continue to rise and pt more lethargic and confused c/w uremia. Had 1st HD 1/27, 2nd finished 0400 on 1/29. Plan #3 tomorrow and will CLIP for [place] not want him dialyzing till 0300 back to back nights. possibly also tonight. Will follow. Suspect this is likely ESRD.  Pulm edema - w/ bilat rales, CXR 1/27 sig worse --> cont to lower vol w/ HD Renal transplant - done in 2012, w/ function slowly failing. He was CKD stage 5 in sept 2023. Taking only prograf at '2mg'$  am and '3mg'$  pm. Morning dose dc'd now due to high level 9.1 (3.0-8.0).  BP - BP's normal to low, will start midodrine 10 tid COVID infection - suspected covid pna, per pmd Atrial fib - seen by cards DM2 per pmd  Subjective: UOP low as expected with HD. HD #1 2.6 L, #2 2.5L.   Vitals:   01/20/23 0605 01/20/23 1139 01/20/23 1334 01/20/23 1500  BP:  117/60 124/70 (!) 130/57  Pulse:  93 93 100  Resp:  16  20  Temp:  98 F (36.7 C)    TempSrc:  Oral    SpO2:  96% 96% 94%  Weight: 89.1 kg     Height:        Exam: Gen alert, no distress +JVD to angle of jaw Chest dec'd L base, bilat rales 1/3 up RRR no MRG Abd soft ntnd no mass or ascites +bs Ext mild pretib/ pedal edema Neuro is alert, Ox 3 , nf, no asterixis    LUA AVF +bruit     Home meds include - allopurinol, sensipar 30 qd, rocaltrol 0.25 mcg qd, pepcid, lasix 40 qd, glipizide, insulin glargine,  toprol xl 50, pravachol, prograf '2mg'$  am and '3mg'$  pm, prns/ vits/ supps       Date                          Creat               eGFR    2014                         2.1- 2.7    2015                         1.5- 2.1    Feb- aug 2022         3.0- 3.5            18- 21    Sept 2022                 3.65                 17 ml/min    Sept 2023  4.09                 15 ml/min    01/14/23                     5.37                     01/15/23                     5.27                 11 ml/min     CXR 1/23 - IMPRESSION: 1. Reticulonodular opacities bilaterally, which may reflect atypical/viral infection.   CR chest noncont - Lungs/Pleura: Central airways are patent. Bibasilar linear opacities similar to prior exam and likely due to scarring. Small bilateral pleural effusions    CXR 1/27 - marked worsening of bilat effusions and pulm edema    UCr 134, UNa < 10    UA - prot 100, many bact, 0-5 rbc, >50 wbc    Renal US transplant - normal [place], no hydro, nonspecific elevated intrarenal resistive indices.         Recent Labs  Lab 01/19/23 0045 01/20/23 0040  HGB 11.7* 13.7  ALBUMIN 3.8 4.2  CALCIUM 8.1* 8.2*  PHOS 5.6* 3.6  CREATININE 4.75* 3.03*  K 3.2* 3.5   Recent Labs  Lab 01/15/23 0031  FERRITIN 367*   Inpatient medications:  allopurinol  100 mg Oral Daily   apixaban  2.5 mg Oral BID   calcitRIOL  0.25 mcg Oral Daily   Chlorhexidine Gluconate Cloth  6 each Topical Q0600   Chlorhexidine Gluconate Cloth  6 each Topical Q0600   Chlorhexidine Gluconate Cloth  6 each Topical Q0600   cinacalcet  30 mg Oral Q48H   docusate sodium  100 mg Oral BID   famotidine  10 mg Oral QODAY   folic acid  2 mg Oral Daily   insulin aspart  0-9 Units Subcutaneous TID WC   [START ON 01/21/2023] insulin glargine-yfgn  12 Units Subcutaneous Daily   loratadine  10 mg Oral Daily   midodrine  10 mg Oral TID WC   mouth rinse  15 mL Mouth Rinse 4 times per day   pravastatin  10 mg Oral QHS    predniSONE  20 mg Oral Daily   sodium chloride flush  3 mL Intravenous Q12H   tacrolimus  2 mg Oral BID    sodium chloride Stopped (01/19/23 1701)   sodium chloride, acetaminophen **OR** acetaminophen, chlorpheniramine-HYDROcodone, guaiFENesin-dextromethorphan, heparin, HYDROcodone-acetaminophen, ipratropium, metoprolol tartrate, mouth rinse, polyethylene glycol, sodium chloride flush

## 2023-01-21 ENCOUNTER — Inpatient Hospital Stay (HOSPITAL_COMMUNITY): Payer: Medicare Other

## 2023-01-21 DIAGNOSIS — J9602 Acute respiratory failure with hypercapnia: Secondary | ICD-10-CM | POA: Diagnosis not present

## 2023-01-21 DIAGNOSIS — J9601 Acute respiratory failure with hypoxia: Secondary | ICD-10-CM | POA: Diagnosis not present

## 2023-01-21 LAB — CBC WITH DIFFERENTIAL/PLATELET
Abs Immature Granulocytes: 0.3 10*3/uL — ABNORMAL HIGH (ref 0.00–0.07)
Basophils Absolute: 0 10*3/uL (ref 0.0–0.1)
Basophils Relative: 1 %
Eosinophils Absolute: 0 10*3/uL (ref 0.0–0.5)
Eosinophils Relative: 0 %
HCT: 44.3 % (ref 39.0–52.0)
Hemoglobin: 13.4 g/dL (ref 13.0–17.0)
Immature Granulocytes: 4 %
Lymphocytes Relative: 6 %
Lymphs Abs: 0.4 10*3/uL — ABNORMAL LOW (ref 0.7–4.0)
MCH: 31.7 pg (ref 26.0–34.0)
MCHC: 30.2 g/dL (ref 30.0–36.0)
MCV: 104.7 fL — ABNORMAL HIGH (ref 80.0–100.0)
Monocytes Absolute: 0.6 10*3/uL (ref 0.1–1.0)
Monocytes Relative: 8 %
Neutro Abs: 5.5 10*3/uL (ref 1.7–7.7)
Neutrophils Relative %: 81 %
Platelets: 62 10*3/uL — ABNORMAL LOW (ref 150–400)
RBC: 4.23 MIL/uL (ref 4.22–5.81)
RDW: 15.8 % — ABNORMAL HIGH (ref 11.5–15.5)
WBC: 6.9 10*3/uL (ref 4.0–10.5)
nRBC: 0.3 % — ABNORMAL HIGH (ref 0.0–0.2)

## 2023-01-21 LAB — BLOOD GAS, ARTERIAL
Acid-Base Excess: 0.8 mmol/L (ref 0.0–2.0)
Acid-base deficit: 1.2 mmol/L (ref 0.0–2.0)
Acid-base deficit: 1.2 mmol/L (ref 0.0–2.0)
Bicarbonate: 28.2 mmol/L — ABNORMAL HIGH (ref 20.0–28.0)
Bicarbonate: 28.2 mmol/L — ABNORMAL HIGH (ref 20.0–28.0)
Bicarbonate: 29.8 mmol/L — ABNORMAL HIGH (ref 20.0–28.0)
Drawn by: 36277
Drawn by: 362771
Drawn by: 362771
O2 Saturation: 96.9 %
O2 Saturation: 98.2 %
O2 Saturation: 99.1 %
Patient temperature: 36.6
Patient temperature: 36.5
Patient temperature: 37
pCO2 arterial: 68 mmHg (ref 32–48)
pCO2 arterial: 68 mmHg (ref 32–48)
pCO2 arterial: 68 mmHg (ref 32–48)
pH, Arterial: 7.23 — ABNORMAL LOW (ref 7.35–7.45)
pH, Arterial: 7.23 — ABNORMAL LOW (ref 7.35–7.45)
pH, Arterial: 7.25 — ABNORMAL LOW (ref 7.35–7.45)
pO2, Arterial: 71 mmHg — ABNORMAL LOW (ref 83–108)
pO2, Arterial: 88 mmHg (ref 83–108)
pO2, Arterial: 107 mmHg (ref 83–108)

## 2023-01-21 LAB — POCT I-STAT 7, (LYTES, BLD GAS, ICA,H+H)
Acid-Base Excess: 1 mmol/L (ref 0.0–2.0)
Bicarbonate: 27.7 mmol/L (ref 20.0–28.0)
Calcium, Ion: 1.1 mmol/L — ABNORMAL LOW (ref 1.15–1.40)
HCT: 43 % (ref 39.0–52.0)
Hemoglobin: 14.6 g/dL (ref 13.0–17.0)
O2 Saturation: 100 %
Patient temperature: 97.5
Potassium: 3.8 mmol/L (ref 3.5–5.1)
Sodium: 132 mmol/L — ABNORMAL LOW (ref 135–145)
TCO2: 29 mmol/L (ref 22–32)
pCO2 arterial: 50.2 mmHg — ABNORMAL HIGH (ref 32–48)
pH, Arterial: 7.348 — ABNORMAL LOW (ref 7.35–7.45)
pO2, Arterial: 320 mmHg — ABNORMAL HIGH (ref 83–108)

## 2023-01-21 LAB — COMPREHENSIVE METABOLIC PANEL
ALT: 26 U/L (ref 0–44)
AST: 18 U/L (ref 15–41)
Albumin: 3.7 g/dL (ref 3.5–5.0)
Alkaline Phosphatase: 47 U/L (ref 38–126)
Anion gap: 6 (ref 5–15)
BUN: 62 mg/dL — ABNORMAL HIGH (ref 8–23)
CO2: 31 mmol/L (ref 22–32)
Calcium: 8.5 mg/dL — ABNORMAL LOW (ref 8.9–10.3)
Chloride: 97 mmol/L — ABNORMAL LOW (ref 98–111)
Creatinine, Ser: 4.99 mg/dL — ABNORMAL HIGH (ref 0.61–1.24)
GFR, Estimated: 12 mL/min — ABNORMAL LOW (ref >60)
Glucose, Bld: 89 mg/dL (ref 70–99)
Potassium: 4.1 mmol/L (ref 3.5–5.1)
Sodium: 134 mmol/L — ABNORMAL LOW (ref 135–145)
Total Bilirubin: 0.8 mg/dL (ref 0.3–1.2)
Total Protein: 5.9 g/dL — ABNORMAL LOW (ref 6.5–8.1)

## 2023-01-21 LAB — MAGNESIUM: Magnesium: 2 mg/dL (ref 1.7–2.4)

## 2023-01-21 LAB — BODY FLUID CELL COUNT WITH DIFFERENTIAL
Other Cells, Fluid: UNDETERMINED %
Total Nucleated Cell Count, Fluid: 50 cu mm (ref 0–1000)

## 2023-01-21 LAB — GLUCOSE, CAPILLARY
Glucose-Capillary: 112 mg/dL — ABNORMAL HIGH (ref 70–99)
Glucose-Capillary: 116 mg/dL — ABNORMAL HIGH (ref 70–99)
Glucose-Capillary: 119 mg/dL — ABNORMAL HIGH (ref 70–99)
Glucose-Capillary: 119 mg/dL — ABNORMAL HIGH (ref 70–99)
Glucose-Capillary: 71 mg/dL (ref 70–99)
Glucose-Capillary: 89 mg/dL (ref 70–99)

## 2023-01-21 LAB — C-REACTIVE PROTEIN: CRP: <0.5 mg/dL (ref <1.0)

## 2023-01-21 LAB — PHOSPHORUS: Phosphorus: 7.1 mg/dL — ABNORMAL HIGH (ref 2.5–4.6)

## 2023-01-21 LAB — BRAIN NATRIURETIC PEPTIDE: B Natriuretic Peptide: 1286.5 pg/mL — ABNORMAL HIGH (ref 0.0–100.0)

## 2023-01-21 MED ORDER — FENTANYL CITRATE PF 50 MCG/ML IJ SOSY
50.0000 ug | PREFILLED_SYRINGE | Freq: Once | INTRAMUSCULAR | Status: AC
  Administered 2023-01-21: 50 ug via INTRAVENOUS
  Filled 2023-01-21: qty 1

## 2023-01-21 MED ORDER — ALBUMIN HUMAN 25 % IV SOLN
25.0000 g | Freq: Once | INTRAVENOUS | Status: AC
  Administered 2023-01-21: 25 g via INTRAVENOUS
  Filled 2023-01-21: qty 100

## 2023-01-21 MED ORDER — ETOMIDATE 2 MG/ML IV SOLN: 20.0000 mg | Freq: Once | INTRAVENOUS | Status: DC

## 2023-01-21 MED ORDER — DEXTROSE 50 % IV SOLN
25.0000 mL | Freq: Once | INTRAVENOUS | Status: AC
  Administered 2023-01-21: 25 mL via INTRAVENOUS
  Filled 2023-01-21: qty 50

## 2023-01-21 MED ORDER — FENTANYL CITRATE PF 50 MCG/ML IJ SOSY
50.0000 ug | PREFILLED_SYRINGE | INTRAMUSCULAR | Status: DC | PRN
  Administered 2023-01-21 (×2): 50 ug via INTRAVENOUS
  Filled 2023-01-21 (×2): qty 1

## 2023-01-21 MED ORDER — PREDNISONE 20 MG PO TABS: 10.0000 mg | ORAL_TABLET | Freq: Every day | ORAL | Status: DC

## 2023-01-21 MED ORDER — PREDNISONE 10 MG PO TABS
10.0000 mg | ORAL_TABLET | Freq: Every day | ORAL | Status: DC
  Administered 2023-01-21: 10 mg via ORAL
  Filled 2023-01-21: qty 1

## 2023-01-21 MED ORDER — DOCUSATE SODIUM 50 MG/5ML PO LIQD
100.0000 mg | Freq: Two times a day (BID) | ORAL | Status: DC
  Administered 2023-01-21: 100 mg
  Filled 2023-01-21: qty 10

## 2023-01-21 MED ORDER — FENTANYL CITRATE PF 50 MCG/ML IJ SOSY: 50.0000 ug | PREFILLED_SYRINGE | Freq: Once | INTRAMUSCULAR | Status: DC

## 2023-01-21 MED ORDER — NOREPINEPHRINE 4 MG/250ML-% IV SOLN: 2.0000 ug/min | INTRAVENOUS | Status: DC

## 2023-01-21 MED ORDER — FOLIC ACID 1 MG PO TABS: 2.0000 mg | ORAL_TABLET | Freq: Every day | ORAL | Status: DC

## 2023-01-21 MED ORDER — MIDAZOLAM HCL 2 MG/2ML IJ SOLN
2.0000 mg | Freq: Once | INTRAMUSCULAR | Status: AC
  Administered 2023-01-21: 2 mg via INTRAVENOUS
  Filled 2023-01-21: qty 2

## 2023-01-21 MED ORDER — APIXABAN 2.5 MG PO TABS
2.5000 mg | ORAL_TABLET | Freq: Two times a day (BID) | ORAL | Status: DC
  Administered 2023-01-21: 2.5 mg
  Filled 2023-01-21: qty 1

## 2023-01-21 MED ORDER — PROSOURCE TF20 ENFIT COMPATIBL EN LIQD
60.0000 mL | Freq: Every day | ENTERAL | Status: DC
  Administered 2023-01-21: 60 mL
  Filled 2023-01-21: qty 60

## 2023-01-21 MED ORDER — ALLOPURINOL 100 MG PO TABS: 100.0000 mg | ORAL_TABLET | Freq: Every day | ORAL | Status: DC

## 2023-01-21 MED ORDER — SODIUM CHLORIDE 0.9 % IV SOLN
500.0000 mg | INTRAVENOUS | Status: AC
  Administered 2023-01-21 – 2023-01-27 (×7): 500 mg via INTRAVENOUS
  Filled 2023-01-21 (×9): qty 5

## 2023-01-21 MED ORDER — ETOMIDATE 2 MG/ML IV SOLN
20.0000 mg | Freq: Once | INTRAVENOUS | Status: AC
  Administered 2023-01-21: 20 mg via INTRAVENOUS
  Filled 2023-01-21: qty 10

## 2023-01-21 MED ORDER — SODIUM CHLORIDE 0.9 % IV SOLN
2.0000 g | INTRAVENOUS | Status: AC
  Administered 2023-01-21 – 2023-01-29 (×9): 2 g via INTRAVENOUS
  Filled 2023-01-21 (×9): qty 20

## 2023-01-21 MED ORDER — FUROSEMIDE 10 MG/ML IJ SOLN
60.0000 mg | Freq: Once | INTRAMUSCULAR | Status: AC
  Administered 2023-01-21: 60 mg via INTRAVENOUS
  Filled 2023-01-21: qty 6

## 2023-01-21 MED ORDER — MIDODRINE HCL 5 MG PO TABS: 10.0000 mg | ORAL_TABLET | Freq: Three times a day (TID) | ORAL | Status: DC

## 2023-01-21 MED ORDER — SODIUM CHLORIDE 0.9 % IV SOLN
250.0000 mL | INTRAVENOUS | Status: DC
  Administered 2023-01-21: 250 mL via INTRAVENOUS

## 2023-01-21 MED ORDER — PRAVASTATIN SODIUM 10 MG PO TABS
10.0000 mg | ORAL_TABLET | Freq: Every day | ORAL | Status: DC
  Administered 2023-01-21: 10 mg
  Filled 2023-01-21: qty 1

## 2023-01-21 MED ORDER — HALOPERIDOL LACTATE 5 MG/ML IJ SOLN
2.0000 mg | Freq: Four times a day (QID) | INTRAMUSCULAR | Status: DC | PRN
  Administered 2023-01-21: 2 mg via INTRAMUSCULAR
  Filled 2023-01-21 (×2): qty 1

## 2023-01-21 MED ORDER — DEXMEDETOMIDINE HCL IN NACL 400 MCG/100ML IV SOLN
0.0000 ug/kg/h | INTRAVENOUS | Status: DC
  Administered 2023-01-21: 0.4 ug/kg/h via INTRAVENOUS
  Administered 2023-01-21: 1.1 ug/kg/h via INTRAVENOUS
  Filled 2023-01-21: qty 200

## 2023-01-21 MED ORDER — FENTANYL CITRATE PF 50 MCG/ML IJ SOSY
50.0000 ug | PREFILLED_SYRINGE | INTRAMUSCULAR | Status: DC | PRN
  Administered 2023-01-22: 50 ug via INTRAVENOUS
  Filled 2023-01-21: qty 1

## 2023-01-21 MED ORDER — LACTATED RINGERS IV BOLUS
250.0000 mL | Freq: Once | INTRAVENOUS | Status: AC
  Administered 2023-01-21: 250 mL via INTRAVENOUS

## 2023-01-21 MED ORDER — FAMOTIDINE 20 MG PO TABS: 10.0000 mg | ORAL_TABLET | ORAL | Status: DC

## 2023-01-21 MED ORDER — POLYETHYLENE GLYCOL 3350 17 G PO PACK: 17.0000 g | PACK | Freq: Every day | ORAL | Status: DC | PRN

## 2023-01-21 MED ORDER — LORAZEPAM 2 MG/ML IJ SOLN
1.0000 mg | Freq: Once | INTRAMUSCULAR | Status: AC
  Administered 2023-01-21: 1 mg via INTRAVENOUS
  Filled 2023-01-21: qty 1

## 2023-01-21 MED ORDER — MIDAZOLAM HCL 2 MG/2ML IJ SOLN: 2.0000 mg | Freq: Once | INTRAMUSCULAR | Status: DC

## 2023-01-21 MED ORDER — ROCURONIUM BROMIDE 50 MG/5ML IV SOLN
85.0000 mg | Freq: Once | INTRAVENOUS | Status: DC
  Filled 2023-01-21: qty 8.5

## 2023-01-21 MED ORDER — VITAL HIGH PROTEIN PO LIQD
1000.0000 mL | ORAL | Status: DC
  Administered 2023-01-21: 1000 mL

## 2023-01-21 MED ORDER — POLYETHYLENE GLYCOL 3350 17 G PO PACK
17.0000 g | PACK | Freq: Every day | ORAL | Status: DC
  Administered 2023-01-22: 17 g
  Filled 2023-01-21: qty 1

## 2023-01-21 MED ORDER — ALBUTEROL SULFATE (2.5 MG/3ML) 0.083% IN NEBU
5.0000 mg | INHALATION_SOLUTION | Freq: Once | RESPIRATORY_TRACT | Status: AC
  Administered 2023-01-21: 5 mg via RESPIRATORY_TRACT

## 2023-01-21 MED ORDER — NOREPINEPHRINE 4 MG/250ML-% IV SOLN
INTRAVENOUS | Status: AC
  Administered 2023-01-21: 10 ug/min via INTRAVENOUS
  Filled 2023-01-21: qty 250

## 2023-01-21 MED ORDER — HALOPERIDOL LACTATE 5 MG/ML IJ SOLN: 2.0000 mg | Freq: Once | INTRAMUSCULAR | Status: DC

## 2023-01-21 MED ORDER — ROCURONIUM BROMIDE 10 MG/ML (PF) SYRINGE
PREFILLED_SYRINGE | INTRAVENOUS | Status: AC
  Administered 2023-01-21: 86 mg via INTRAVENOUS
  Filled 2023-01-21: qty 10

## 2023-01-21 MED ORDER — INSULIN GLARGINE-YFGN 100 UNIT/ML ~~LOC~~ SOLN: 6.0000 [IU] | Freq: Every day | SUBCUTANEOUS | Status: DC

## 2023-01-21 MED ORDER — ROCURONIUM BROMIDE 50 MG/5ML IV SOLN
1.0000 mg/kg | Freq: Once | INTRAVENOUS | Status: AC
  Filled 2023-01-21: qty 8.6

## 2023-01-21 MED ORDER — ALBUTEROL SULFATE (2.5 MG/3ML) 0.083% IN NEBU
INHALATION_SOLUTION | RESPIRATORY_TRACT | Status: AC
  Administered 2023-01-21: 5 mg via RESPIRATORY_TRACT
  Filled 2023-01-21: qty 6

## 2023-01-21 NOTE — Progress Notes (Addendum)
[facility] Progress Note  Assessment/ Plan: AKI on CKD 5T progressing to ESRD - b/l creat 4.1 from sept 2023, eGFR 15 ml/min. Creat here 5.2 on admission in the setting of 1 month coughing, poor po intake and newly + here for COVID infection. BP's were low, CT w/o edema, CXR w/o edema. UA +wbcs, renal US unremarkable. Mild pedal edema on initial exam. Viral pna is likely given hypoxia. AKI felt to be intravasc vol depletion vs vol overload vs progression of CKD.  Tried cautious IVF's w/ no improvement in B/Cr or UOP, then tried IV lasix + albumin also w/o good response. B/Cr continue to rise and pt more lethargic and confused c/w uremia. Had 1st HD 1/27, 2nd finished 0400 on 1/29. Seen on HD emergently  #3 today, plan next HD on Wednesday on and then Fri. Issues with moving an mild infiltration but appears both needles are still in. He needs Ativan, wife bedside trying to calm him down.  Started CLIP process for [employer] st.   Suspect this is has progressed to ESRD; discussed also with Dr. Carolin Sicks who has been following the pt as an outpt in clinic for many years. Spouse hopeful for recovery and I explained to her last  night that he is likely ESRD and probably won't come off dialysis.  Pulm edema - w/ bilat rales, CXR 1/27 sig worse --> cont to lower vol w/ HD Renal transplant - done in 2012, w/ function slowly failing. He was CKD stage 5 in sept 2023. Taking only prograf at '2mg'$  am and '2mg'$  pm (pm dose decr from 3) because of high level 9.1 (3.0-8.0).  BP - BP's normal to low, will start midodrine 10 tid COVID infection - suspected covid pna, per pmd Atrial fib - seen by cards DM2 per pmd  Subjective: UOP low as expected with HD. HD #1 2.6 L, #2 2.5L. Retaining CO2 and hopefully HD emergently this AM will help.  Vitals:   01/21/23 0609 01/21/23 0734 01/21/23 0750 01/21/23 0857  BP: (!) 163/87 121/68 121/68 120/66  Pulse: (!) 102 92 92   Resp: 19 17 (!) 24   Temp: 97.8 F (36.6  C) (!) 97.3 F (36.3 C)  (!) 97.5 F (36.4 C)  TempSrc: Axillary Oral  Oral  SpO2: 95% 94% 95%   Weight:    87.9 kg  Height:        Exam: Gen alert, no distress +JVD to angle of jaw Chest dec'd L base, bilat rales 1/3 up RRR no MRG Abd soft ntnd no mass or ascites +bs Ext mild pretib/ pedal edema Neuro is alert, Ox 3 , nf, no asterixis    LUA AVF +bruit     Home meds include - allopurinol, sensipar 30 qd, rocaltrol 0.25 mcg qd, pepcid, lasix 40 qd, glipizide, insulin glargine, toprol xl 50, pravachol, prograf '2mg'$  am and '3mg'$  pm, prns/ vits/ supps       Date                          Creat               eGFR    2014                         2.1- 2.7    2015  1.5- 2.1    Feb- aug 2022         3.0- 3.5            18- 21    Sept 2022                 3.65                 17 ml/min    Sept 2023                 4.09                 15 ml/min    01/14/23                     5.37                     01/15/23                     5.27                 11 ml/min     CXR 1/23 - IMPRESSION: 1. Reticulonodular opacities bilaterally, which may reflect atypical/viral infection.   CR chest noncont - Lungs/Pleura: Central airways are patent. Bibasilar linear opacities similar to prior exam and likely due to scarring. Small bilateral pleural effusions    CXR 1/27 - marked worsening of bilat effusions and pulm edema    UCr 134, UNa < 10    UA - prot 100, many bact, 0-5 rbc, >50 wbc    Renal US transplant - normal [place], no hydro, nonspecific elevated intrarenal resistive indices.         Recent Labs  Lab 01/20/23 0040 01/21/23 0145  HGB 13.7 13.4  ALBUMIN 4.2 3.7  CALCIUM 8.2* 8.5*  PHOS 3.6 7.1*  CREATININE 3.03* 4.99*  K 3.5 4.1   Recent Labs  Lab 01/15/23 0031  FERRITIN 367*   Inpatient medications:  allopurinol  100 mg Oral Daily   apixaban  2.5 mg Oral BID   calcitRIOL  0.25 mcg Oral Daily   Chlorhexidine Gluconate Cloth  6 each Topical Q0600    cinacalcet  30 mg Oral Q48H   docusate sodium  100 mg Oral BID   famotidine  10 mg Oral QODAY   folic acid  2 mg Oral Daily   insulin aspart  0-9 Units Subcutaneous TID WC   loratadine  10 mg Oral Daily   LORazepam  1 mg Intravenous Once   midodrine  10 mg Oral TID WC   mouth rinse  15 mL Mouth Rinse 4 times per day   pravastatin  10 mg Oral QHS   predniSONE  10 mg Oral Daily   sodium chloride flush  3 mL Intravenous Q12H   tacrolimus  2 mg Oral BID    sodium chloride Stopped (01/19/23 1701)   sodium chloride, acetaminophen **OR** acetaminophen, chlorpheniramine-HYDROcodone, guaiFENesin-dextromethorphan, haloperidol lactate, heparin, HYDROcodone-acetaminophen, ipratropium, metoprolol tartrate, mouth rinse, polyethylene glycol, sodium chloride flush

## 2023-01-21 NOTE — Progress Notes (Signed)
Received patient in bed with Bipap on  Informed consent signed and in chart.   Treatment initiated: 0930 Treatment completed: 1330  Patient tolerated well.  without acute distress.  Hand-off given to patient's nurse.   Access used: AVF Access issues: none  Total UF removed: 2 L Medication(s) given: Albumin 25 g IV Post HD VS: 110/65 P 94 R 14 Post HD weight: 86 kg   [facility]

## 2023-01-21 NOTE — Progress Notes (Addendum)
Requested to see pt for out-pt HD needs at d/c. Attempted to reach pt and pt's spouse via phone due to pt being on covid isolation. Message left for pt's wife requesting a return call. Will assist as needed.    Melven Sartorius Renal Navigator [phone]  Addendum at 11:23 am: Spoke to pt's wife via phone. Introduced self and explained role. Pt received out-pt HD at [facility] prior to transplant in Dec 2012. Pt and pt's wife would like for pt to receive out-pt HD at clinic again if possible. Referral submitted to [facility] admissions for review. Pt's wife can transport pt to HD at d/c.

## 2023-01-21 NOTE — Progress Notes (Addendum)
[facility]                                                                              Russell Bowers, is a 72 y.o. male, DOB - May 23, 1951, QTM:[id number] Admit date - 01/14/2023    Outpatient Primary MD for the patient is [name], MD  LOS - 7  days  Chief Complaint  Patient presents with   Fatigue   Cough       Brief summary   Patient is a 72year old male with DM2, OSA on BiPAP, diastolic CHF, Renal transplant due to ESRD secondary to IgA nephropathy s/p DDKT on 12/01/2011, recurent UTI, history of prostate cancer s/p radical prostatectomy in 2011 presented with shortness of breath, cough and SOB for the past 1 week. No fever no chills. Uses BIpap for sleep at baseline, No history of PE/DVT.  He was found to be COVID-positive with evidence of fluid overload, he also went into AKI with suspicion for kidney transplant rejection.  He was admitted to [facility] and subsequently transferred to [facility] for further care.  He was transferred to my care on 01/18/2022 on day 4 of his hospital stay.   Assessment & Plan   Acute respiratory failure with hypoxia and hypercapnia (HCC) 2 fluid overload with acute on chronic diastolic CHF EF 62%, worsening renal function >> ESRD. -His main issue appears to be volume overload, renal function is declining, nephrology is following the patient and assisting with diuresis and fluid removal, COVID-19 infection appears incidental.  Taper off IV antibiotics as there is no bacterial infection, with initiation of HD on 01/18/2023 and fluid removal hypoxia much improved, tapering oxygen, uses nighttime BiPAP daytime oxygen, continue to advance activity and titrate down oxygen as needed, added flutter valve and I-S for pulmonary toiletry encouraged to sit in chair in the daytime.  Acute metabolic encephalopathy due to some CO2 retention in a patient with OSA who is on BiPAP at night at home, could not wear BiPAP for the full  duration of night on 01/20/2023, also pulmonary edema inhibiting with proper gaseous exchange in the pulmonary bed, BiPAP reapplied in daytime with adjusted settings on 01/21/2023, Lasix and HD for fluid removal discussed with nephrology.  Pneumonia due to COVID-19 virus  -Mild with stable CRP, low-dose steroids.  Suspected atrial fibrillation - was ruled out earlier in admission however on 01/18/2023 EKG suspicious for atrial fibrillation confirmed by cardiologist Dr. Sanda Klein on 01/19/2023, will place on low-dose beta-blocker cleared by cardiology to do so, also [place] vasc 2 score greater than 3 initiate Eliquis.  Thrombocytopenia acute on chronic.  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  Acute on chronic heart failure with preserved ejection fraction (HFpEF) ([place]), mild AR, AS.  EF 60 to 65%  -Etiology and nephrology both following and managing diuresis.  HD initiated on 01/18/2023 with much improvement in his fluid overload.  Acute kidney injury, superimposed on CKD5, history of renal transplantation question now developing ESRD. -Creatinine 4.09 on 08/25/2022, on steroids and  transplant medications, nephrology following closely.  HD initiated by nephrology on 01/18/2023.  Hyperkalemia -Continue Lokelma scheduled  Severe sepsis secondary to COVID-19, pneumonia, ?urinary tract infection - -Met sepsis criteria on admission with tachypnea, tachycardia, respiratory failure, AKI, source likely UTI, COVID-pneumonia, has history of frequent UTIs, urine culture showed no growth  Mildly high D Dimer - likely due to mild Covid, Leg [place] stable, on Hep - monitor.  OSA.  BiPAP at night.  Anemia due to chronic kidney disease  - H&H stable  Some intermittent diplopia which has resolved.  According the patient ongoing for a few weeks.  No headache, no diplopia on exam, no focal deficits.   Continue to monitor, if stable outpatient follow-up with his ophthalmologist Dr. Carolynn Sayers, he does have history of retinal detachment in the past.    DM2 (diabetes mellitus, type 2) ([place]), uncontrolled with hyperglycemia, IDDM - HbA1C 7.7, Insulin dose adjusted on 01/19/2023 as steroids being tapered rapidly  CBG (last 3)  Recent Labs    01/20/23 2030 01/21/23 0614 01/21/23 0835  GLUCAP 118* 89 71    Estimated body mass index is 28.62 kg/m as calculated from the following:   Height as of this encounter: '5\' 9"'$  (1.753 m).   Weight as of this encounter: 87.9 kg.  Code Status: full  DVT Prophylaxis:  apixaban (ELIQUIS) tablet 2.5 mg Start: 01/19/23 1000 heparin gtt >> Eliquis apixaban (ELIQUIS) tablet 2.5 mg  Level of Care: Level of care: Progressive Family Communication: Updated patient's wife at the bedside in detail on 01/18/2023, 01/19/2023, 01/20/23, 01/21/23 Disposition Plan:      Remains inpatient appropriate: Transfer to [facility] pending for closer monitoring and possibly needing HD in near future (has AV fistula)  Procedures:  Bipap QHS  Consultants:   Cardiology Nephrology CCM  Medications  allopurinol  100 mg Oral Daily   apixaban  2.5 mg Oral BID   calcitRIOL  0.25 mcg Oral Daily   Chlorhexidine Gluconate Cloth  6 each Topical Q0600   cinacalcet  30 mg Oral Q48H   dextrose  25 mL Intravenous Once   docusate sodium  100 mg Oral BID   famotidine  10 mg Oral QODAY   folic acid  2 mg Oral Daily   insulin aspart  0-9 Units Subcutaneous TID WC   loratadine  10 mg Oral Daily   midodrine  10 mg Oral TID WC   mouth rinse  15 mL Mouth Rinse 4 times per day   pravastatin  10 mg Oral QHS   predniSONE  10 mg Oral Daily   sodium chloride flush  3 mL Intravenous Q12H   tacrolimus  2 mg Oral BID      Subjective:   Patient in bed, appears comfortable, denies any headache, no fever, no chest pain or pressure, no shortness of breath , no abdominal pain. No new focal  weakness.  Objective:   Vitals:   01/21/23 0609 01/21/23 0734 01/21/23 0750 01/21/23 0857  BP: (!) 163/87 121/68 121/68 120/66  Pulse: (!) 102 92 92   Resp: 19 17 (!) 24   Temp: 97.8 F (36.6 C) (!) 97.3 F (36.3 C)  (!) 97.5 F (36.4 C)  TempSrc: Axillary Oral  Oral  SpO2: 95% 94% 95%   Weight:    87.9 kg  Height:        Intake/Output Summary (Last 24 hours) at 01/21/2023 0904 Last data filed at 01/20/2023 1334 Gross per 24 hour  Intake 200 ml  Output --  Net 200 ml     Wt Readings from Last 3 Encounters:  01/21/23 87.9 kg  04/25/22 81.7 kg  07/26/21 79.4 kg   Physical Exam  Awake but mildly confused, No new F.N deficits,  [place].AT,PERRAL Supple Neck, No JVD,   Symmetrical Chest wall movement, Good air movement bilaterally, +ve rales RRR,No Gallops, Rubs or new Murmurs,  +ve B.Sounds, Abd Soft, No tenderness,   No Cyanosis, Clubbing or edema     Data Reviewed:  I have personally reviewed following labs   Recent Labs  Lab 01/17/23 1844 01/18/23 0032 01/19/23 0045 01/20/23 0040 01/21/23 0145  WBC 8.7 8.0 6.2 9.8 6.9  HGB 13.3 12.8* 11.7* 13.7 13.4  HCT 45.5 42.9 38.1* 44.7 44.3  PLT 111* 96* 70* 75* 62*  MCV 108.1* 105.9* 103.5* 103.5* 104.7*  MCH 31.6 31.6 31.8 31.7 31.7  MCHC 29.2* 29.8* 30.7 30.6 30.2  RDW 16.4* 16.4* 15.9* 15.8* 15.8*  LYMPHSABS 0.2* 0.1* 0.2* 0.3* 0.4*  MONOABS 0.4 0.2 0.4 1.0 0.6  EOSABS 0.0 0.0 0.0 0.0 0.0  BASOSABS 0.0 0.0 0.0 0.1 0.0    Recent Labs  Lab 01/14/23 1249 01/14/23 1249 01/14/23 1319 01/14/23 1420 01/15/23 0042 01/15/23 0042 01/15/23 0112 01/16/23 0322 01/16/23 1359 01/17/23 0316 01/18/23 0032 01/19/23 0045 01/20/23 0040 01/21/23 0145  NA 136  --    < >  --  135  --   --  136   < > 135 137 135 133* 134*  K 4.9  --    < >  --  6.0*  --   --  6.3*   < > 5.2* 4.5 3.2* 3.5 4.1  CL 100  --   --   --  100  --   --  103   < > 102 100 98 96* 97*  CO2 27  --   --   --  24  --   --  22   < > '22 25 28 28 31   '$ ANIONGAP 9  --   --   --  11  --   --  11   < > '11 12 9 9 6  '$ GLUCOSE 192*  --   --   --  182*  --   --  323*   < > 294* 297* 99 121* 89  BUN 81*  --   --   --  76*  --   --  112*   < > 109* 121* 68* 34* 62*  CREATININE 5.37*  --   --   --  5.27*  --   --  5.92*   < > 6.29* 6.53* 4.75* 3.03* 4.99*  AST 11*  --   --   --  12*  --   --  10*  --  10* 16 14* 29 18  ALT 12  --   --   --  14  --   --  12  --  '13 12 14 '$ 37 26  ALKPHOS 74  --   --   --  69  --   --  65  --  60 61 45 55 47  BILITOT 0.7  --   --   --  0.7  --   --  0.4  --  0.6 0.5 0.5 0.8 0.8  ALBUMIN 4.0  --   --   --  3.3*  --   --  3.1*  --  3.7 4.1 3.8 4.2 3.7  CRP  --    < >  --   --  1.0*  --   --  0.6  --  0.5 <0.5 <0.5 <0.5 <0.5  DDIMER  --   --   --   --   --    < > 0.68* 0.55*  --  0.77* 1.60* 1.36* 2.79*  --   PROCALCITON  --   --   --   --  0.16  --   --  0.25  --   --   --   --   --   --   LATICACIDVEN  --   --   --  0.7  --   --   --   --   --   --   --   --   --   --   INR  --   --   --   --   --   --  1.2  --   --   --   --   --   --   --   TSH  --   --   --   --  1.077  --   --   --   --   --   --  0.311*  --   --   HGBA1C  --   --   --   --  7.7*  --   --   --   --   --   --   --   --   --   BNP 1,704.0*  --   --   --   --   --   --   --   --   --  1,317.7* 811.0* 888.3* 1,286.5*  MG  --    < >  --   --  2.8*  --   --  2.9*  --  2.7* 2.8* 2.3 1.8 2.0  CALCIUM 9.8  --   --   --  8.5*  --   --  8.1*   < > 8.1* 8.0* 8.1* 8.2* 8.5*   < > = values in this interval not displayed.      Radiology Studies: I have personally reviewed the imaging studies  DG Chest Port 1 View  Result Date: 01/21/2023 CLINICAL DATA:  Confusion.  Cough. EXAM: PORTABLE CHEST 1 VIEW COMPARISON:  CXR 01/18/23 FINDINGS: Cardiomegaly. Persistent bibasilar pulmonary opacities nonspecific, and could represent a combination of pleural effusion and superimposed atelectasis or infection. There are air bronchograms of the right lower lobe, which raises the  possibility for infection. There is marked gastric gaseous distention, correlate for symptoms of nausea and vomiting. IMPRESSION: 1. Persistent bibasilar pulmonary opacities nonspecific, and could represent a combination of pleural effusion and superimposed atelectasis. There are air bronchograms in the right lower lobe, which raises the possibility for infection. 2. Cardiomegaly. 3. There is marked gastric gaseous distention, correlate for symptoms of nausea and vomiting. Electronically Signed   By: Marin Roberts M.D.   On: 01/21/2023 08:07    Signature  -    Lala Lund M.D on 01/21/2023 at 9:04 AM   -  To page go to [email or web address]

## 2023-01-21 NOTE — Consult Note (Signed)
NAME:  Russell Bowers, MRN:  [id number], DOB:  1951/08/29, LOS: 7 ADMISSION DATE:  01/14/2023, CONSULTATION DATE: 01/21/2023 REFERRING MD: Dr. Candiss Norse, CHIEF COMPLAINT: Acute respiratory failure  History of Present Illness:  72 year old male with a plethora of health issues that are well-documented below. He was admitted 1/23 with acute hypoxic and hypercapnic respiratory failure, COVID 19 PNA/pneumonitis, UTI, new A.fib.   On 1/30, he was more restless and agitated. He had ABG drawn which demonstrated ongoing hypercapnic and hypoxic respiratory failure. He was placed on BiPAP and despite multiple changes and being on it for a few hours now, he has had essentially no change. He did receive Haldol and Ativan earlier; however, per RN, his mental status has remained about the same (restlessness/agitation did improve).  PCCM subsequently called in consultation for concerns that he will require intubation.  Pertinent  Medical History   Past Medical History:  Diagnosis Date   ADHD (attention deficit hyperactivity disorder)    Allergic rhinitis    Cancer ([place])    Prostate cancer   Cellulitis and abscess of trunk 2014   history of back abscess   Cellulitis and abscess of unspecified site 2014   lower back   Detached retina    Diabetes mellitus without complication (HCC)    Elevated troponin    Fistula    OF LEFT ARM   Gout    H/O kidney transplant    History of congestive heart failure    FOLLOWED BY DR. 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 Melvyn Novas   Hypertension    Mild concentric left ventricular hypertrophy (LVH)    AND AORTIC STENOSIS FOLLOWED IN THE PAST BY CARDIOLOGIST DR. Marlou Porch.   OSA treated with BiPAP    Personal history of colonic polyps    Prostate cancer ([place]) 11/2010   PROSTATECTOMY WITH UROLOGIST DR. DAVIS   Renal failure     Shortness of breath    Sleep disorder breathing    FOLLOWED BY PULMONOLOGIST DR. [facility] Events: Including procedures, antibiotic start and stop dates in addition to other pertinent events     Interim History / Subjective:  No change despite multiple hours on BiPAP. Remains altered/encephalopathic. Blood gas unchanged.  Objective   Blood pressure 110/65, pulse 94, temperature (!) 97.5 F (36.4 C), temperature source Oral, resp. rate 14, height 5' 9"  (1.753 m), weight 86 kg, SpO2 100 %.    FiO2 (%):  [30 %-40 %] 40 %   Intake/Output Summary (Last 24 hours) at 01/21/2023 1504 Last data filed at 01/21/2023 1328 Gross per 24 hour  Intake 0 ml  Output 2000 ml  Net -2000 ml   Filed Weights   01/21/23 0857 01/21/23 0951 01/21/23 1332  Weight: 87.9 kg 87.9 kg 86 kg    Examination: General: Ill-appearing male who is somnolent on BiPAP HENT: [place]/AT. BiPAP in place. + JVD Lungs: Decreased air movement throughout Cardiovascular: IRIR, no M/R/G Abdomen: BS x 4, S/NT/ND Extremities: 1+ edema Neuro: Altered, does not follow commands  Assessment & Plan:   Acute respiratory failure in the setting of volume overload 2/2 renal failure (concern transplant failure) with agitation requiring sedation and poor response to noninvasive mechanical ventilatory. COVID-19 PNA/pneumonitis Possible PNA OSA on nocturnal BiPAP Transfer to ICU, will plan for intubation given no change with NIV Bronch after intubation for BAL given  immunocompromised state, rule out atypicals/opportunistics Continue low dose prednisone Continue CTX/Azithro (re-ordered) Continue bronchial hygiene Follow CXR  Acute metabolic and hypercarbic encephalopathy - not improving on BiPAP Transfer to ICU as above for intubation Avoid sedating meds  Atrial fibrillation, dCHF. Continue AC with Eliquis Hold BB Volume removal per HD  Acute kidney injury with history of renal transplant (on Prograf) - now  developing end-stage renal disease and concerns for transplant rejection.  Hyperkalemia - s/p Lokelma. Nephrology following, appreciate the assistance Continue Prograf, Midodrine (has required for volume removal) Follow BMP  DM2 SSI if glucose consistently > 180 Hold home Glipizide, Lantus   Best Practice (right click and "Reselect all SmartList Selections" daily)   Diet/type: NPO DVT prophylaxis: DOAC GI prophylaxis: PPI Lines: N/A Foley:  N/A Code Status:  full code Last date of multidisciplinary goals of care discussion [tbd]  Labs   CBC: Recent Labs  Lab 01/17/23 1844 01/18/23 0032 01/19/23 0045 01/20/23 0040 01/21/23 0145  WBC 8.7 8.0 6.2 9.8 6.9  NEUTROABS 7.7 7.3 5.4 8.2* 5.5  HGB 13.3 12.8* 11.7* 13.7 13.4  HCT 45.5 42.9 38.1* 44.7 44.3  MCV 108.1* 105.9* 103.5* 103.5* 104.7*  PLT 111* 96* 70* 75* 62*    Basic Metabolic Panel: Recent Labs  Lab 01/17/23 0316 01/18/23 0032 01/19/23 0045 01/20/23 0040 01/21/23 0145  NA 135 137 135 133* 134*  K 5.2* 4.5 3.2* 3.5 4.1  CL 102 100 98 96* 97*  CO2 22 25 28 28 31   GLUCOSE 294* 297* 99 121* 89  BUN 109* 121* 68* 34* 62*  CREATININE 6.29* 6.53* 4.75* 3.03* 4.99*  CALCIUM 8.1* 8.0* 8.1* 8.2* 8.5*  MG 2.7* 2.8* 2.3 1.8 2.0  PHOS 7.6* 7.8* 5.6* 3.6 7.1*   GFR: Estimated Creatinine Clearance: 14.7 mL/min (A) (by C-G formula based on SCr of 4.99 mg/dL (H)). Recent Labs  Lab 01/15/23 0042 01/16/23 0322 01/17/23 0316 01/18/23 0032 01/19/23 0045 01/20/23 0040 01/21/23 0145  PROCALCITON 0.16 0.25  --   --   --   --   --   WBC 7.2 3.5*   < > 8.0 6.2 9.8 6.9   < > = values in this interval not displayed.    Liver Function Tests: Recent Labs  Lab 01/17/23 0316 01/18/23 0032 01/19/23 0045 01/20/23 0040 01/21/23 0145  AST 10* 16 14* 29 18  ALT 13 12 14  37 26  ALKPHOS 60 61 45 55 47  BILITOT 0.6 0.5 0.5 0.8 0.8  PROT 6.4* 6.5 5.7* 6.5 5.9*  ALBUMIN 3.7 4.1 3.8 4.2 3.7   No results for input(s):  "LIPASE", "AMYLASE" in the last 168 hours. No results for input(s): "AMMONIA" in the last 168 hours.  ABG    Component Value Date/Time   PHART 7.25 (L) 01/21/2023 1340   PCO2ART 68 (HH) 01/21/2023 1340   PO2ART 107 01/21/2023 1340   HCO3 29.8 (H) 01/21/2023 1340   TCO2 33 (H) 01/14/2023 1319   ACIDBASEDEF 1.2 01/21/2023 1032   O2SAT 99.1 01/21/2023 1340     Coagulation Profile: Recent Labs  Lab 01/15/23 0112  INR 1.2    Cardiac Enzymes: Recent Labs  Lab 01/15/23 0042  CKTOTAL 24*    HbA1C: Hgb A1c MFr Bld  Date/Time Value Ref Range Status  01/15/2023 12:42 AM 7.7 (H) 4.8 - 5.6 % Final    Comment:    (NOTE) Pre diabetes:          5.7%-6.4%  Diabetes:              >  6.4%  Glycemic control for   <7.0% adults with diabetes   02/18/2021 01:22 AM 7.4 (H) 4.8 - 5.6 % Final    Comment:    (NOTE) Pre diabetes:          5.7%-6.4%  Diabetes:              >6.4%  Glycemic control for   <7.0% adults with diabetes     CBG: Recent Labs  Lab 01/20/23 1629 01/20/23 2030 01/21/23 0614 01/21/23 0835 01/21/23 1120  GLUCAP 130* 118* 89 71 119*    Review of Systems:   Unable to obtain as pt is encephalopathic.  Past Medical History:  He,  has a past medical history of ADHD (attention deficit hyperactivity disorder), Allergic rhinitis, Cancer ([place]), Cellulitis and abscess of trunk (2014), Cellulitis and abscess of unspecified site (2014), Detached retina, Diabetes mellitus without complication ([place]), Elevated troponin, Fistula, Gout, H/O kidney transplant, History of congestive heart failure, History of recurrent pneumonia, Hypercarbia, Hypertension, Mild concentric left ventricular hypertrophy (LVH), OSA treated with BiPAP, Personal history of colonic polyps, Prostate cancer ([place]) (11/2010), Renal failure, Shortness of breath, and Sleep disorder breathing.   Surgical History:   Past Surgical History:  Procedure Laterality Date   CATARACT EXTRACTION, BILATERAL   11/2014   COLONOSCOPY  09/2014   KIDNEY TRANSPLANT  12/01/2011   PROSTATECTOMY     FOR PROSTATE CANCER   prostectomy  11/2010   RETINAL DETACHMENT SURGERY  06/2002     Social History:   reports that he quit smoking about 29 years ago. His smoking use included cigarettes. He has a 21.00 pack-year smoking history. He has never used smokeless tobacco. He reports current alcohol use of about 1.0 - 2.0 standard drink of alcohol per week. He reports that he does not use drugs.   Family History:  His family history includes Alzheimer's disease in his father; Atrial fibrillation in his mother; Diabetes in his mother; Heart Problems in his sister; Heart disease in his maternal grandfather and maternal grandmother; Hypertension in his mother.   Allergies Allergies  Allergen Reactions   Azathioprine Other (See Comments)   Pollen Extract Other (See Comments)    stuffy nose     Home Medications  Prior to Admission medications   Medication Sig Start Date End Date Taking? Authorizing Provider  allopurinol (ZYLOPRIM) 100 MG tablet Take 100 mg by mouth daily. 07/30/22  Yes [provider]  calcitRIOL (ROCALTROL) 0.25 MCG capsule Take 0.25 mcg by mouth daily. 11/08/20  Yes [provider]  cinacalcet (SENSIPAR) 30 MG tablet Take 30 mg by mouth every other day.   Yes [provider]  docusate sodium (COLACE) 100 MG capsule Take 100 mg by mouth daily.   Yes [provider]  famotidine (PEPCID) 20 MG tablet Take 20 mg by mouth daily.   Yes [provider]  folic acid (FOLVITE) 1 MG tablet TAKE 2 TABLETS BY MOUTH EVERY DAY 02/12/22  Yes Ennever, Rudell Cobb, MD  furosemide (LASIX) 40 MG tablet Take 1 tablet (40 mg total) by mouth daily. 02/22/21  Yes Johnson, Clanford L, MD  glipiZIDE (GLUCOTROL) 5 MG tablet Take 5 mg by mouth daily before breakfast. 3 per day, 2 qam and 1 pm daily   Yes [provider]  insulin glargine (LANTUS) 100 UNIT/ML injection Inject 0.1  mLs (10 Units total) into the skin at bedtime. Patient taking differently: Inject 18-20 Units into the skin at bedtime. 02/21/21  Yes Johnson, Clanford L,  MD  loperamide (IMODIUM) 2 MG capsule Take 2 mg by mouth as needed for diarrhea or loose stools.   Yes [provider]  loratadine (CLARITIN) 10 MG tablet Take 10 mg by mouth daily.   Yes [provider]  Magnesium Cl-Calcium Carbonate (SLOW-MAG PO) Take 1 tablet by mouth daily.    Yes [provider]  metoprolol succinate (TOPROL-XL) 50 MG 24 hr tablet Take 50 mg by mouth daily.   Yes [provider]  pravastatin (PRAVACHOL) 10 MG tablet Take 10 mg by mouth at bedtime.    Yes [provider]  tacrolimus (PROGRAF) 0.5 MG capsule Take 1 mg by mouth See admin instructions. 2 qam and 3 qpm   Yes [provider]  vitamin B-12 (CYANOCOBALAMIN) 500 MCG tablet Take 1,000 mcg by mouth at bedtime.   Yes [provider]  B-D ULTRAFINE III SHORT PEN 31G X 8 MM MISC Inject into the skin daily. 04/11/21   [provider]  glipiZIDE (GLUCOTROL XL) 5 MG 24 hr tablet Take 1 tablet (5 mg total) by mouth daily with breakfast. Patient not taking: Reported on 04/25/2022 02/21/21   Murlean Iba, MD  [facility] ULTRA test strip 3 (three) times daily. 02/26/21   [provider]     Critical care time: 40 min    Montey Hora, [place] [facility] For pager details, please see AMION or use Epic chat  After 1900, please call [facility] for cross coverage needs 01/21/2023, 3:05 PM

## 2023-01-21 NOTE — Plan of Care (Signed)
  Problem: Education: Goal: Knowledge of General Education information will improve Description: Including pain rating scale, medication(s)/side effects and non-pharmacologic comfort measures Outcome: Progressing   Problem: Clinical Measurements: Goal: Diagnostic test results will improve Outcome: Progressing Goal: Cardiovascular complication will be avoided Outcome: Progressing   Problem: Coping: Goal: Level of anxiety will decrease Outcome: Progressing   Problem: Pain Managment: Goal: General experience of comfort will improve Outcome: Progressing   Problem: Safety: Goal: Ability to remain free from injury will improve Outcome: Progressing   Problem: Health Behavior/Discharge Planning: Goal: Ability to manage health-related needs will improve Outcome: Not Progressing   Problem: Clinical Measurements: Goal: Ability to maintain clinical measurements within normal limits will improve Outcome: Not Progressing Goal: Will remain free from infection Outcome: Not Progressing Goal: Respiratory complications will improve Outcome: Not Progressing   Problem: Activity: Goal: Risk for activity intolerance will decrease Outcome: Not Progressing   Problem: Nutrition: Goal: Adequate nutrition will be maintained Outcome: Not Progressing   Problem: Elimination: Goal: Will not experience complications related to bowel motility Outcome: Not Progressing Goal: Will not experience complications related to urinary retention Outcome: Not Progressing

## 2023-01-21 NOTE — Progress Notes (Signed)
OT Cancellation Note  Patient Details Name: MIKLO AKEN MRN: [id number] DOB: 1951-10-31   Cancelled Treatment:    Reason Eval/Treat Not Completed: Medical issues which prohibited therapy (pt on continuous BiPAP, RN asking therapy to hold at this time. Will follow up for OT tx as appropriate.)  Renaye Rakers, OTD, OTR/L SecureChat Preferred Acute Rehab [phone]   Renaye Rakers [place] 01/21/2023, 2:20 PM

## 2023-01-21 NOTE — Procedures (Signed)
Intubation Procedure Note  Russell Bowers  [id number]  23-Feb-1951  Date:01/21/23  Time:4:32 PM   Provider Performing:Linsi Humann [name]    Procedure: Intubation (31500)  Indication(s) Respiratory Failure  Consent Risks of the procedure as well as the alternatives and risks of each were explained to the patient and/or caregiver.  Consent for the procedure was obtained and is signed in the bedside chart   Anesthesia Etomidate, Versed, Fentanyl, and Rocuronium   Time Out Verified patient identification, verified procedure, site/side was marked, verified correct patient position, special equipment/implants available, medications/allergies/relevant history reviewed, required imaging and test results available.   Sterile Technique Usual hand hygeine, masks, and gloves were used   Procedure Description Patient positioned in bed supine.  Sedation given as noted above.  Patient was intubated with endotracheal tube using Glidescope.  View was Grade 1 full glottis .  Number of attempts was 1.  Colorimetric CO2 detector was consistent with tracheal placement.   Complications/Tolerance None; patient tolerated the procedure well. Chest X-ray is ordered to verify placement.   EBL Minimal   Specimen(s) None

## 2023-01-21 NOTE — Procedures (Signed)
Bronchoscopy Procedure Note  Russell Bowers  [id number]  04-01-1951  Date:01/21/23  Time:4:33 PM   Provider Performing:Maleeka Sabatino [name] , NP-S  Procedure(s):  Flexible Bronchoscopy [id number])  Indication(s) tracheobronchomalacia   Consent Risks of the procedure as well as the alternatives and risks of each were explained to the patient and/or caregiver.  Consent for the procedure was obtained and is signed in the bedside chart  Anesthesia Fent/Versed/Etomidate/Roc   Time Out Verified patient identification, verified procedure, site/side was marked, verified correct patient position, special equipment/implants available, medications/allergies/relevant history reviewed, required imaging and test results available.   Sterile Technique Usual hand hygiene, masks, gowns, and gloves were used   Procedure Description Bronchoscope advanced through endotracheal tube and into airway.  Airways were examined down to subsegmental level with findings noted below.   Following diagnostic evaluation, BAL(s) performed in anterior segment of the RLL with normal saline and return of 30cc fluid  Findings: Dynamic Collapse of the Bronchus intermedius     Complications/Tolerance None; patient tolerated the procedure well. Chest X-ray is not needed post procedure.   EBL Minimal   Specimen(s) BAL

## 2023-01-21 NOTE — TOC Progression Note (Signed)
Transition of Care [facility]) - Progression Note    Patient Details  Name: Russell Bowers MRN: [id number] Date of Birth: [birth year]  Transition of Care [facility]) CM/SW Contact  Zenon Mayo, RN Phone Number: 01/21/2023, 11:42 AM  Clinical Narrative:    Patient is set up with [facility] for HHPT, also he has new ESRD and renal navigator is clipping him, and his wife will transport him to HD after dc per Renal Navigator.   Expected Discharge Plan: [place] Barriers to Discharge: Continued Medical Work up  Expected Discharge Plan and Services In-house Referral: NA Discharge Planning Services: CM Consult Post Acute Care Choice: [place] arrangements for the past 2 months: Single Family Home                 DME Arranged: [place] DME Agency: [employer] Date DME Agency Contacted: 01/20/23 Time DME Agency Contacted: 1212 Representative spoke with at DME Agency: [employer] [place]: PT [place]: [place] Date [place]: 01/20/23 Time [place]: 1191 Representative spoke with at [place]: [place] ([place]) Interventions SDOH Screenings   Tobacco Use: Medium Risk (01/14/2023)    Readmission Risk Interventions    01/20/2023   12:09 PM  Readmission Risk Prevention Plan  Transportation Screening Complete  PCP or Specialist Appt within 3-5 Days Complete  HRI or Home Care Consult Complete  Palliative Care Screening Not Applicable  Medication Review (RN Care Manager) Complete

## 2023-01-21 NOTE — Significant Event (Signed)
Rapid Response Event Note   Reason for Call :  Decreased LOC, respiratory   Initial Focused Assessment:  He has recently been very restless and confused, pulling out HD needles.  He was given Haldol and Ativan. Patient is somnolent. He can open his eyes. He is currently on bipap 30% 18/8   Poor air movement  BP 101/53  HR 95  RR 14-20  O2 sat 97% on Bipap   Interventions:  HD currently in progress ABG 7.23/68/28 CCM consulted  Plan of Care:  Repeat ABG in a couple hours Reevaluate mental status and respiratory status post HD completed.  Reassessment Patient has made no improvement Transfer to ICU per CCM with plans for intubation  1545  Assisted with transport to [place]   Event Summary:   MD Notified: Candiss Norse, CCM Call Time: [place] Time: 1027 End Time: [place]  Raliegh Ip, RN

## 2023-01-21 NOTE — Progress Notes (Signed)
PT Cancellation Note  Patient Details Name: Russell Bowers MRN: [id number] DOB: 05/09/1951   Cancelled Treatment:    Reason Eval/Treat Not Completed: (P) Medical issues which prohibited therapy, pt with increased supplemental O2 demands, on continuous BiPAP, RN asking therapy to hold at this time. Will check back as appropriate to continue with PT POC.  Audry Riles. PTA Acute Rehabilitation Services Office: [place] 01/21/2023, 2:47 PM

## 2023-01-22 DIAGNOSIS — J9601 Acute respiratory failure with hypoxia: Secondary | ICD-10-CM | POA: Diagnosis not present

## 2023-01-22 DIAGNOSIS — J9602 Acute respiratory failure with hypercapnia: Secondary | ICD-10-CM | POA: Diagnosis not present

## 2023-01-22 LAB — POCT I-STAT 7, (LYTES, BLD GAS, ICA,H+H)
Acid-Base Excess: 1 mmol/L (ref 0.0–2.0)
Bicarbonate: 28.2 mmol/L — ABNORMAL HIGH (ref 20.0–28.0)
Calcium, Ion: 1.21 mmol/L (ref 1.15–1.40)
HCT: 40 % (ref 39.0–52.0)
Hemoglobin: 13.6 g/dL (ref 13.0–17.0)
O2 Saturation: 95 %
Patient temperature: 98.6
Potassium: 3.7 mmol/L (ref 3.5–5.1)
Sodium: 132 mmol/L — ABNORMAL LOW (ref 135–145)
TCO2: 30 mmol/L (ref 22–32)
pCO2 arterial: 52.9 mmHg — ABNORMAL HIGH (ref 32–48)
pH, Arterial: 7.335 — ABNORMAL LOW (ref 7.35–7.45)
pO2, Arterial: 81 mmHg — ABNORMAL LOW (ref 83–108)

## 2023-01-22 LAB — BASIC METABOLIC PANEL
Anion gap: 17 — ABNORMAL HIGH (ref 5–15)
BUN: 50 mg/dL — ABNORMAL HIGH (ref 8–23)
CO2: 23 mmol/L (ref 22–32)
Calcium: 8.7 mg/dL — ABNORMAL LOW (ref 8.9–10.3)
Chloride: 93 mmol/L — ABNORMAL LOW (ref 98–111)
Creatinine, Ser: 4.59 mg/dL — ABNORMAL HIGH (ref 0.61–1.24)
GFR, Estimated: 13 mL/min — ABNORMAL LOW (ref >60)
Glucose, Bld: 165 mg/dL — ABNORMAL HIGH (ref 70–99)
Potassium: 4 mmol/L (ref 3.5–5.1)
Sodium: 133 mmol/L — ABNORMAL LOW (ref 135–145)

## 2023-01-22 LAB — PHOSPHORUS
Phosphorus: 5 mg/dL — ABNORMAL HIGH (ref 2.5–4.6)
Phosphorus: 4.3 mg/dL (ref 2.5–4.6)

## 2023-01-22 LAB — CBC WITH DIFFERENTIAL/PLATELET
Abs Immature Granulocytes: 0.1 10*3/uL — ABNORMAL HIGH (ref 0.00–0.07)
Basophils Absolute: 0 10*3/uL (ref 0.0–0.1)
Basophils Relative: 0 %
Eosinophils Absolute: 0 10*3/uL (ref 0.0–0.5)
Eosinophils Relative: 0 %
HCT: 41.9 % (ref 39.0–52.0)
Hemoglobin: 12.8 g/dL — ABNORMAL LOW (ref 13.0–17.0)
Immature Granulocytes: 1 %
Lymphocytes Relative: 7 %
Lymphs Abs: 0.5 10*3/uL — ABNORMAL LOW (ref 0.7–4.0)
MCH: 31.3 pg (ref 26.0–34.0)
MCHC: 30.5 g/dL (ref 30.0–36.0)
MCV: 102.4 fL — ABNORMAL HIGH (ref 80.0–100.0)
Monocytes Absolute: 0.7 10*3/uL (ref 0.1–1.0)
Monocytes Relative: 9 %
Neutro Abs: 5.7 10*3/uL (ref 1.7–7.7)
Neutrophils Relative %: 83 %
Platelets: 51 10*3/uL — ABNORMAL LOW (ref 150–400)
RBC: 4.09 MIL/uL — ABNORMAL LOW (ref 4.22–5.81)
RDW: 15.4 % (ref 11.5–15.5)
WBC: 7 10*3/uL (ref 4.0–10.5)
nRBC: 0 % (ref 0.0–0.2)

## 2023-01-22 LAB — GLUCOSE, CAPILLARY
Glucose-Capillary: 168 mg/dL — ABNORMAL HIGH (ref 70–99)
Glucose-Capillary: 175 mg/dL — ABNORMAL HIGH (ref 70–99)
Glucose-Capillary: 144 mg/dL — ABNORMAL HIGH (ref 70–99)
Glucose-Capillary: 105 mg/dL — ABNORMAL HIGH (ref 70–99)
Glucose-Capillary: 136 mg/dL — ABNORMAL HIGH (ref 70–99)
Glucose-Capillary: 231 mg/dL — ABNORMAL HIGH (ref 70–99)

## 2023-01-22 LAB — MAGNESIUM
Magnesium: 2 mg/dL (ref 1.7–2.4)
Magnesium: 1.9 mg/dL (ref 1.7–2.4)

## 2023-01-22 LAB — C-REACTIVE PROTEIN: CRP: 0.7 mg/dL (ref <1.0)

## 2023-01-22 MED ORDER — MIDODRINE HCL 5 MG PO TABS
10.0000 mg | ORAL_TABLET | Freq: Three times a day (TID) | ORAL | Status: DC
  Administered 2023-01-22: 10 mg via ORAL
  Filled 2023-01-22: qty 2

## 2023-01-22 MED ORDER — FAMOTIDINE 20 MG PO TABS: 10.0000 mg | ORAL_TABLET | ORAL | Status: DC

## 2023-01-22 MED ORDER — POLYETHYLENE GLYCOL 3350 17 G PO PACK: 17.0000 g | PACK | Freq: Every day | ORAL | Status: DC | PRN

## 2023-01-22 MED ORDER — PREDNISONE 20 MG PO TABS
10.0000 mg | ORAL_TABLET | Freq: Every day | ORAL | Status: DC
  Administered 2023-01-22 – 2023-01-25 (×4): 10 mg via ORAL
  Filled 2023-01-22 (×4): qty 1

## 2023-01-22 MED ORDER — ALLOPURINOL 100 MG PO TABS
100.0000 mg | ORAL_TABLET | Freq: Every day | ORAL | Status: DC
  Administered 2023-01-22 – 2023-01-25 (×4): 100 mg via ORAL
  Filled 2023-01-22 (×4): qty 1

## 2023-01-22 MED ORDER — PANTOPRAZOLE SODIUM 40 MG IV SOLR
40.0000 mg | Freq: Every day | INTRAVENOUS | Status: DC
  Administered 2023-01-22 – 2023-01-26 (×5): 40 mg via INTRAVENOUS
  Filled 2023-01-22 (×5): qty 10

## 2023-01-22 MED ORDER — DOCUSATE SODIUM 100 MG PO CAPS
100.0000 mg | ORAL_CAPSULE | Freq: Two times a day (BID) | ORAL | Status: DC
  Administered 2023-01-22 – 2023-01-24 (×5): 100 mg via ORAL
  Filled 2023-01-22 (×6): qty 1

## 2023-01-22 MED ORDER — POLYETHYLENE GLYCOL 3350 17 G PO PACK
17.0000 g | PACK | Freq: Every day | ORAL | Status: DC
  Administered 2023-01-23 – 2023-01-24 (×2): 17 g via ORAL
  Filled 2023-01-22 (×2): qty 1

## 2023-01-22 MED ORDER — INSULIN ASPART 100 UNIT/ML IJ SOLN
0.0000 [IU] | INTRAMUSCULAR | Status: DC
  Administered 2023-01-22: 3 [IU] via SUBCUTANEOUS
  Administered 2023-01-23 (×2): 1 [IU] via SUBCUTANEOUS

## 2023-01-22 MED ORDER — FOLIC ACID 1 MG PO TABS
2.0000 mg | ORAL_TABLET | Freq: Every day | ORAL | Status: DC
  Administered 2023-01-22 – 2023-01-25 (×4): 2 mg via ORAL
  Filled 2023-01-22 (×4): qty 2

## 2023-01-22 MED ORDER — APIXABAN 2.5 MG PO TABS
2.5000 mg | ORAL_TABLET | Freq: Two times a day (BID) | ORAL | Status: DC
  Administered 2023-01-22 – 2023-01-25 (×7): 2.5 mg via ORAL
  Filled 2023-01-22 (×7): qty 1

## 2023-01-22 MED ORDER — PRAVASTATIN SODIUM 10 MG PO TABS
10.0000 mg | ORAL_TABLET | Freq: Every day | ORAL | Status: DC
  Administered 2023-01-22 – 2023-01-24 (×3): 10 mg via ORAL
  Filled 2023-01-22 (×5): qty 1

## 2023-01-22 NOTE — Progress Notes (Addendum)
RT called to patient room do patient self extubating. Patient was on NRB and vital signs stable at this time. RT initiated HFNC per RCP MD aware. Vital signs stable at this time.

## 2023-01-22 NOTE — Progress Notes (Signed)
  Inpatient Rehab Admissions Coordinator :  Per therapy change in recommendations patient was screened for CIR candidacy by Danne Baxter RN MSN. Patient is not yet at a level to tolerate the intensity required to pursue a CIR admit . Noted HFNC. New ESRD for hemodialysis, Hx renal transplant. Patient may have the potential to progress to become a candidate. The CIR admissions team will follow and monitor for progress and place a Rehab Consult order if felt to be appropriate. Please contact me with any questions.  Danne Baxter RN MSN Admissions Coordinator [phone]

## 2023-01-22 NOTE — Progress Notes (Signed)
[facility] Progress Note  Assessment/ Plan: AKI on CKD 5T progressing to ESRD - b/l creat 4.1 from sept 2023, eGFR 15 ml/min. Creat here 5.2 on admission in the setting of 1 month coughing, poor po intake and newly + here for COVID infection. BP's were low, CT w/o edema, CXR w/o edema. UA +wbcs, renal US unremarkable. Mild pedal edema on initial exam. Viral pna is likely given hypoxia. AKI felt to be intravasc vol depletion vs vol overload vs progression of CKD.  Tried cautious IVF's w/ no improvement in B/Cr or UOP, then tried IV lasix + albumin also w/o good response. B/Cr continue to rise and pt more lethargic and confused c/w uremia. Had 1st HD 1/27, 2nd finished 0400 on 1/29. Seen on HD #4 today not as agitated c/w yesterday. Tolerating UF 2L, spouse bedside and updated. Hopefully we can get him th rough a full treatment. Next HD tentatively for Fri.  Started CLIP process for [employer] st.   Suspect this is has progressed to ESRD; discussed also with Dr. Carolin Sicks who has been following the pt as an outpt in clinic for many years. Spouse hopeful for recovery and I explained to her last  night that he is likely ESRD and probably won't come off dialysis.  Pulm edema - w/ bilat rales, CXR 1/27 sig worse --> cont to lower vol w/ HD Renal transplant - done in 2012, w/ function slowly failing. He was CKD stage 5 in sept 2023. Taking only prograf at '2mg'$  am and '2mg'$  pm (pm dose decr from 3) because of high level 9.1 (3.0-8.0).  BP - BP's normal to low, started midodrine 10 tid COVID infection - suspected covid pna, per pmd Atrial fib - seen by cards DM2 per pmd  Subjective: UOP low as expected with HD. HD #1 2.6 L, #2 2.5, short tx #3 on 1/30 bec of agitation. Pulled his ET overnight and was retaining CO2. He has calmed down and tolerating HD.  Vitals:   01/22/23 0900 01/22/23 0915 01/22/23 0930 01/22/23 0945  BP: (!) 153/87 (!) 161/93 (!) 170/93 (!) 153/60  Pulse: (!) 107 (!) 108 (!)  103 (!) 109  Resp: (!) 33 (!) 33 (!) 27 (!) 23  Temp:      TempSrc:      SpO2: 99% 97% 99% 100%  Weight:      Height:        Exam: Gen alert, no distress +JVD to angle of jaw Chest dec'd L base, bilat rales bases RRR no MRG Abd soft ntnd no mass or ascites +bs Ext mild pretib/ pedal edema Neuro is alert, Ox 3 , nf, no asterixis  LUA AVF +bruit w/ needles in place     Home meds include - allopurinol, sensipar 30 qd, rocaltrol 0.25 mcg qd, pepcid, lasix 40 qd, glipizide, insulin glargine, toprol xl 50, pravachol, prograf '2mg'$  am and '3mg'$  pm, prns/ vits/ supps       Date                          Creat               eGFR    2014                         2.1- 2.7    2015  1.5- 2.1    Feb- aug 2022         3.0- 3.5            18- 21    Sept 2022                 3.65                 17 ml/min    Sept 2023                 4.09                 15 ml/min    01/14/23                     5.37                     01/15/23                     5.27                 11 ml/min     CXR 1/23 - IMPRESSION: 1. Reticulonodular opacities bilaterally, which may reflect atypical/viral infection.   CR chest noncont - Lungs/Pleura: Central airways are patent. Bibasilar linear opacities similar to prior exam and likely due to scarring. Small bilateral pleural effusions    CXR 1/27 - marked worsening of bilat effusions and pulm edema    UCr 134, UNa < 10    UA - prot 100, many bact, 0-5 rbc, >50 wbc    Renal US transplant - normal [place], no hydro, nonspecific elevated intrarenal resistive indices.         Recent Labs  Lab 01/20/23 0040 01/21/23 0145 01/21/23 1751 01/22/23 0141 01/22/23 0627  HGB 13.7 13.4   < > 13.6 12.8*  ALBUMIN 4.2 3.7  --   --   --   CALCIUM 8.2* 8.5*  --   --  8.7*  PHOS 3.6 7.1*  --   --  5.0*  CREATININE 3.03* 4.99*  --   --  4.59*  K 3.5 4.1   < > 3.7 4.0   < > = values in this interval not displayed.   No results for input(s): "IRON", "TIBC",  "FERRITIN" in the last 168 hours.  Inpatient medications:  allopurinol  100 mg Oral Daily   apixaban  2.5 mg Oral BID   calcitRIOL  0.25 mcg Oral Daily   Chlorhexidine Gluconate Cloth  6 each Topical Q0600   docusate sodium  100 mg Oral BID   folic acid  2 mg Oral Daily   insulin aspart  0-9 Units Subcutaneous TID WC   midodrine  10 mg Oral TID WC   mouth rinse  15 mL Mouth Rinse 4 times per day   pantoprazole (PROTONIX) IV  40 mg Intravenous QHS   polyethylene glycol  17 g Per Tube Daily   pravastatin  10 mg Oral QHS   predniSONE  10 mg Oral Daily   sodium chloride flush  3 mL Intravenous Q12H   tacrolimus  2 mg Oral BID    sodium chloride Stopped (01/19/23 1701)   sodium chloride 10 mL/hr at 01/22/23 0700   azithromycin Stopped (01/21/23 1849)   cefTRIAXone (ROCEPHIN)  IV Stopped (01/21/23 2047)   dexmedetomidine (PRECEDEX) IV infusion Stopped (01/22/23 0013)   norepinephrine (LEVOPHED) Adult infusion Stopped (01/21/23 1630)   sodium chloride, acetaminophen **  OR** acetaminophen, heparin, metoprolol tartrate, mouth rinse, polyethylene glycol, sodium chloride flush

## 2023-01-22 NOTE — Progress Notes (Signed)
[place] for Eliquis  Indication: new onset atrial fibrillation  Allergies  Allergen Reactions   Azathioprine Other (See Comments)   Pollen Extract Other (See Comments)    stuffy nose    Patient Measurements: Height: '5\' 9"'$  (175.3 cm) Weight: 81.3 kg (179 lb 3.7 oz) IBW/kg (Calculated) : 70.7  Vital Signs: Temp: 98.9 F (37.2 C) (01/31 0804) Temp Source: Oral (01/31 0804) BP: 129/59 (01/31 1130) Pulse Rate: 97 (01/31 1130)  Labs: Recent Labs    01/20/23 0040 01/21/23 0145 01/21/23 1751 01/22/23 0141 01/22/23 0627  HGB 13.7 13.4 14.6 13.6 12.8*  HCT 44.7 44.3 43.0 40.0 41.9  PLT 75* 62*  --   --  51*  CREATININE 3.03* 4.99*  --   --  4.59*     Estimated Creatinine Clearance: 14.8 mL/min (A) (by C-G formula based on SCr of 4.59 mg/dL (H)).   Medical History: Past Medical History:  Diagnosis Date   ADHD (attention deficit hyperactivity disorder)    Allergic rhinitis    Cancer ([place])    Prostate cancer   Cellulitis and abscess of trunk 2014   history of back abscess   Cellulitis and abscess of unspecified site 2014   lower back   Detached retina    Diabetes mellitus without complication (HCC)    Elevated troponin    Fistula    OF LEFT ARM   Gout    H/O kidney transplant    History of congestive heart failure    FOLLOWED BY DR. 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 Melvyn Novas   Hypertension    Mild concentric left ventricular hypertrophy (LVH)    AND AORTIC STENOSIS FOLLOWED IN THE PAST BY CARDIOLOGIST DR. Marlou Porch.   OSA treated with BiPAP    Personal history of colonic polyps    Prostate cancer ([place]) 11/2010   PROSTATECTOMY WITH UROLOGIST DR. DAVIS   Renal failure    Shortness of breath    Sleep disorder breathing    FOLLOWED BY PULMONOLOGIST DR. Elsworth Soho    Medications:   Scheduled:   allopurinol  100 mg Oral Daily   apixaban  2.5 mg Oral BID   calcitRIOL  0.25 mcg Oral Daily   Chlorhexidine Gluconate Cloth  6 each Topical Q0600   docusate sodium  100 mg Oral BID   folic acid  2 mg Oral Daily   insulin aspart  0-9 Units Subcutaneous TID WC   midodrine  10 mg Oral TID WC   mouth rinse  15 mL Mouth Rinse 4 times per day   pantoprazole (PROTONIX) IV  40 mg Intravenous QHS   [START ON 01/23/2023] polyethylene glycol  17 g Oral Daily   pravastatin  10 mg Oral QHS   predniSONE  10 mg Oral Daily   sodium chloride flush  3 mL Intravenous Q12H   tacrolimus  2 mg Oral BID   Infusions:   sodium chloride Stopped (01/19/23 1701)   sodium chloride 10 mL/hr at 01/22/23 0700   azithromycin Stopped (01/21/23 1849)   cefTRIAXone (ROCEPHIN)  IV Stopped (01/21/23 2047)   dexmedetomidine (PRECEDEX) IV infusion Stopped (01/22/23 0013)   norepinephrine (LEVOPHED) Adult infusion Stopped (01/21/23 1630)    Assessment: 72 y.o. male.  With PMH of chronic diastolic heart failure, DM2, history of renal transplant on tacrolimus, aortic stenosis, OSA on BiPAP presenting with worsening shortness  of breath and cough over the past 1 to 2 weeks. He is COVID positive. Noted to be in atrial fibrillation, no history of this and no AC PTA. Atrial fibrillation was ruled out earlier in admission, however, EKG on 1/27 was suspicious of atrial fibrillation and confirmed by cardiology on 1/28. Pharmacy consulted to dose Eliquis for new onset atrial fibrillation.   Per previous discussions, pt is receiving low-dose Eliquis 2.5 mg BID due to low platelet count.  Platelets still low, slowly drifting down.    Goal of Therapy:  Monitor platelets by anticoagulation protocol: Yes   Plan:  Continue apixaban 2.'5mg'$  BID for now.  Monitor hgb, platelets, and s/sx of bleeding daily  If platelets improve, consider increasing apixaban dose  [place] Crane, Roylene Reason, [facility] Clinical Pharmacist   01/22/2023 11:33 AM   [facility] pharmacy phone numbers are listed on [email or web address]

## 2023-01-22 NOTE — Progress Notes (Signed)
Pt self-extubated at [place]. Pt noted to have oxygen saturation in the 60s. Pt immediately placed on 15 lpm via NRB. Oxygen saturation improved to mid 90s. Precedex stopped. [place] provider and RT notified. Pt noted to be lethargic. [place] provider wishes for pt to be placed of HFNC at this time with the possibility of Bipap later. Vital signs remain stable and pt remains in noted a-fib with frequent PVCs. Pt is A&Ox4. Pt has weak, productive cough. Oral suction completed without issue. Pt remains stable at this time with no complaints or concerns.

## 2023-01-22 NOTE — Evaluation (Signed)
Physical Therapy Re-Evaluation Patient Details Name: JOANNA HALL MRN: [id number] DOB: [birth year] Today's Date: 01/22/2023  History of Present Illness  Pt is a 72 y/o M presenting to ED on 1/23 with cough and SOB with exertion, found to be hypoxic at 78% on RA, COVID positive. Admitted for acute respiratory failure with hypoxia and hypercapnia and AKI. HD initiated 01/18/23. Intubated 1/30 and transferred to ICU, self-extubated 1/31. PMH includes chronic diastolic heart failure, DM2, ESRD with renal transplant on tacrolimus, aortic stenosis, OSA On BiPAP.   Clinical Impression  Pt has had a functional decline after being intubated and transferred to the ICU 1/30. Pt is demonstrating deficits in cognition, balance, activity tolerance, and strength. He is at high risk for falls, requiring up to minA for bed mobility, min-modAx2 for transfers, and modA (+2 for safety) to ambulate short distances in the room with a RW, displaying a posterior lean. Updated d/c recs to [facility] considering this drastic functional decline and pt's motivation/willingness to participate. Will continue to follow acutely.       Recommendations for follow up therapy are one component of a multi-disciplinary discharge planning process, led by the attending physician.  Recommendations may be updated based on patient status, additional functional criteria and insurance authorization.  Follow Up Recommendations Acute inpatient rehab (3hours/day)      Assistance Recommended at Discharge Frequent or constant Supervision/Assistance  Patient can return home with the following  Assistance with cooking/housework;Assist for transportation;Help with stairs or ramp for entrance;A lot of help with walking and/or transfers;A lot of help with bathing/dressing/bathroom;Direct supervision/assist for financial management;Direct supervision/assist for medications management    Equipment Recommendations Rolling walker (2 wheels)   Recommendations for Other Services  Rehab consult    Functional Status Assessment Patient has had a recent decline in their functional status and demonstrates the ability to make significant improvements in function in a reasonable and predictable amount of time.     Precautions / Restrictions Precautions Precautions: Fall Precaution Comments: HHFNC, covid+ Restrictions Weight Bearing Restrictions: No      Mobility  Bed Mobility Overal bed mobility: Needs Assistance Bed Mobility: Supine to Sit     Supine to sit: Min assist, HOB elevated     General bed mobility comments: MinA to sit up L EOB    Transfers Overall transfer level: Needs assistance Equipment used: Rolling walker (2 wheels) Transfers: Sit to/from Stand Sit to Stand: Mod assist, +2 physical assistance, +2 safety/equipment           General transfer comment: min-mod A +2 to come to stand from EOB, pt with posterior bias    Ambulation/Gait Ambulation/Gait assistance: Min assist, Mod assist, +2 safety/equipment Gait Distance (Feet): 15 Feet Assistive device: Rolling walker (2 wheels) Gait Pattern/deviations: Step-through pattern, Decreased stride length, Leaning posteriorly Gait velocity: reduced Gait velocity interpretation: <1.31 ft/sec, indicative of household ambulator   General Gait Details: Pt with slow gait, needing cues to keep hands on RW grips. Pt displaying increased posterior bias when stepping posteriorly, needing up to modA to prevent LOB, +2 for safety and line management.  Stairs            Wheelchair Mobility    Modified Rankin (Stroke Patients Only)       Balance Overall balance assessment: Needs assistance Sitting-balance support: Bilateral upper extremity supported, Feet supported Sitting balance-Leahy Scale: Fair   Postural control: Posterior lean Standing balance support: Bilateral upper extremity supported, During functional activity Standing balance-Leahy Scale:  Poor Standing balance comment:  Reliant on RW and external physical assistance                             Pertinent Vitals/Pain Pain Assessment Pain Assessment: No/denies pain    Home Living Family/patient expects to be discharged to:: Private residence Living Arrangements: Spouse/significant other Available Help at Discharge: Family;Available 24 hours/day Type of Home: House Home Access: Stairs to enter Entrance Stairs-Rails: Can reach both Entrance Stairs-Number of Steps: 3   Home Layout: One level Home Equipment: Shower seat - built in;Grab bars - tub/shower Additional Comments: wears Bipap at night    Prior Function Prior Level of Function : Independent/Modified Independent             Mobility Comments: no AD ADLs Comments: ind with ADLs     Hand Dominance   Dominant Hand: Right    Extremity/Trunk Assessment   Upper Extremity Assessment Upper Extremity Assessment: Defer to OT evaluation    Lower Extremity Assessment Lower Extremity Assessment: Generalized weakness;Difficult to assess due to impaired cognition    Cervical / Trunk Assessment Cervical / Trunk Assessment: Normal  Communication   Communication: HOH  Cognition Arousal/Alertness: Awake/alert Behavior During Therapy: Impulsive Overall Cognitive Status: Impaired/Different from baseline Area of Impairment: Orientation, Attention, Memory, Following commands, Safety/judgement, Awareness, Problem solving                 Orientation Level: Disoriented to, Place, Time, Situation Current Attention Level: Sustained Memory: Decreased short-term memory, Decreased recall of precautions Following Commands: Follows one step commands consistently Safety/Judgement: Decreased awareness of safety, Decreased awareness of deficits Awareness: Intellectual Problem Solving: Slow processing, Requires verbal cues General Comments: oriented to self only, followed simple direct cues. impulsive with  poor insight and safety awareness. Needs repeated reminders for hand placement on RW        General Comments General comments (skin integrity, edema, etc.): HR to 131, tele alarming Vtach. SpO2 >90% on HHFNC    Exercises     Assessment/Plan    PT Assessment Patient needs continued PT services  PT Problem List Decreased strength;Decreased activity tolerance;Decreased balance;Decreased mobility;Decreased cognition;Decreased coordination;Decreased safety awareness;Cardiopulmonary status limiting activity       PT Treatment Interventions DME instruction;Gait training;Stair training;Functional mobility training;Therapeutic activities;Therapeutic exercise;Balance training;Patient/family education;Neuromuscular re-education;Cognitive remediation    PT Goals (Current goals can be found in the Care Plan section)  Acute Rehab PT Goals Patient Stated Goal: did not state PT Goal Formulation: With patient Time For Goal Achievement: 02/05/23 Potential to Achieve Goals: Good    Frequency Min 3X/week     Co-evaluation PT/OT/SLP Co-Evaluation/Treatment: Yes Reason for Co-Treatment: Complexity of the patient's impairments (multi-system involvement);Necessary to address cognition/behavior during functional activity;For patient/therapist safety;To address functional/ADL transfers PT goals addressed during session: Mobility/safety with mobility;Balance;Proper use of DME OT goals addressed during session: ADL's and self-care       AM-PAC PT "6 Clicks" Mobility  Outcome Measure Help needed turning from your back to your side while in a flat bed without using bedrails?: A Little Help needed moving from lying on your back to sitting on the side of a flat bed without using bedrails?: A Little Help needed moving to and from a bed to a chair (including a wheelchair)?: A Little Help needed standing up from a chair using your arms (e.g., wheelchair or bedside chair)?: Total Help needed to walk in  hospital room?: Total Help needed climbing 3-5 steps with a railing? : Total 6  Click Score: 12    End of Session Equipment Utilized During Treatment: Gait belt;Oxygen Activity Tolerance: Patient tolerated treatment well Patient left: in chair;with call bell/phone within reach;with chair alarm set Nurse Communication: Mobility status;Other (comment) (no grey cord from alarm box to wall) PT Visit Diagnosis: Unsteadiness on feet (R26.81);Difficulty in walking, not elsewhere classified (R26.2);Other abnormalities of gait and mobility (R26.89);Muscle weakness (generalized) (M62.81)    Time: 7530-1040 PT Time Calculation (min) (ACUTE ONLY): 30 min   Charges:   PT Evaluation $PT Re-evaluation: 1 Re-eval          Moishe Spice, PT, DPT Acute Rehabilitation Services  Office: [phone]   Orvan Falconer 01/22/2023, 5:36 PM

## 2023-01-22 NOTE — Progress Notes (Signed)
eLink Physician-Brief Progress Note Patient Name: Russell Bowers DOB: 27-Jun-1951 MRN: [id number]   Date of Service  01/22/2023  HPI/Events of Note  Acute hypoxic hypercapnic resp failure ICU delirium AKI in the setting of renal transplant COVID  Remains NPO due to rconcern for potential intubation  eICU Interventions  Switch Insulin PRN to Q4H PRN rather than with meals     Intervention Category Intermediate Interventions: Hyperglycemia - evaluation and treatment  [name] 01/22/2023, 11:40 PM

## 2023-01-22 NOTE — Progress Notes (Signed)
Pt has been accepted at [facility] on TTS schedule. Update provided to nephrologist. Will follow along and discuss with pt's wife once pt closer to d/c and more stable. Will assist as needed.   Melven Sartorius Renal Navigator [phone]

## 2023-01-22 NOTE — Progress Notes (Signed)
NAME:  Russell Bowers, MRN:  [id number], DOB:  [birth year], LOS: 8 ADMISSION DATE:  01/14/2023, CONSULTATION DATE: 01/21/2023 REFERRING MD: Dr. Candiss Norse, CHIEF COMPLAINT: Acute respiratory failure  History of Present Illness:  72 year old male with a plethora of health issues that are well-documented below. He was admitted 1/23 with acute hypoxic and hypercapnic respiratory failure, COVID 19 PNA/pneumonitis, UTI, new A.fib.   On 1/30, he was more restless and agitated. He had ABG drawn which demonstrated ongoing hypercapnic and hypoxic respiratory failure. He was placed on BiPAP and despite multiple changes and being on it for a few hours now, he has had essentially no change. He did receive Haldol and Ativan earlier; however, per RN, his mental status has remained about the same (restlessness/agitation did improve).  PCCM subsequently called in consultation for concerns that he will require intubation.  Pertinent  Medical History   Past Medical History:  Diagnosis Date   ADHD (attention deficit hyperactivity disorder)    Allergic rhinitis    Cancer ([place])    Prostate cancer   Cellulitis and abscess of trunk 2014   history of back abscess   Cellulitis and abscess of unspecified site 2014   lower back   Detached retina    Diabetes mellitus without complication (HCC)    Elevated troponin    Fistula    OF LEFT ARM   Gout    H/O kidney transplant    History of congestive heart failure    FOLLOWED BY DR. 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 Melvyn Novas   Hypertension    Mild concentric left ventricular hypertrophy (LVH)    AND AORTIC STENOSIS FOLLOWED IN THE PAST BY CARDIOLOGIST DR. Marlou Porch.   OSA treated with BiPAP    Personal history of colonic polyps    Prostate cancer ([place]) 11/2010   PROSTATECTOMY WITH UROLOGIST DR. DAVIS   Renal failure     Shortness of breath    Sleep disorder breathing    FOLLOWED BY PULMONOLOGIST DR. [facility] Events: Including procedures, antibiotic start and stop dates in addition to other pertinent events     Interim History / Subjective:  Self extubated overnight. Remains confused on HHFNC. Getting HD.  Objective   Blood pressure 136/86, pulse 89, temperature 98.9 F (37.2 C), temperature source Oral, resp. rate (!) 23, height 5' 9"  (1.753 m), weight 81.3 kg, SpO2 100 %.    Vent Mode: PRVC FiO2 (%):  [40 %-100 %] 95 % Set Rate:  [20 bmp] 20 bmp Vt Set:  [260 mL-560 mL] 260 mL PEEP:  [5 cmH20] 5 cmH20 Plateau Pressure:  [19 cmH20-20 cmH20] 19 cmH20   Intake/Output Summary (Last 24 hours) at 01/22/2023 0842 Last data filed at 01/22/2023 0700 Gross per 24 hour  Intake 847.44 ml  Output 2050 ml  Net -1202.56 ml    Filed Weights   01/21/23 1332 01/22/23 0319 01/22/23 0804  Weight: 86 kg 85.8 kg 81.3 kg    Examination: No distress, ill appearing Pressure injury on bridge of nose from BIPAP Ext without much edema Answers questions appropriately sometimes but does not follow commands Lungs with transmitted upper airway sounds c/w retained secretions  ABG ok BMP ok Plts drifting down CXR yesterday with improved aeration after intubation Initial BAL neg gram stain, not enough cells for differential  Assessment & Plan:  Acute hypoxemic  respiratory failure- history, bronch results most c/w fluid overload, delirium causing secretion clearance problems, and deconditioning being primary issues.  Bronchomalacia suspected on imaging and confirmed on bronch.  S/p intubation/bronch and self extubation. On ceftriaxone/azithromycin.  Afebrile, no white count but immunosuppressed.  ICU delirium, metabolic encephalopathy- seems a little better but still an issue  AKI on CKD, hx renal transplant, now probably ESRD  Worsening thrombocytopenia- unclear cause ?COVID or septic  response  COVID+: think this is an ancillary finding  - iHD as neg as tolerated from my standpoint - BIPAP qHS and PRN, HHFNC otherwise - f/u bronch results, low threshold to broaden abx, continue ceftriaxone/azithromycin for now - RN to call micro and see if pneumocystis can be added on, for some reason not collected yesterday - Keep in ICU due to intubation risk - Continue eliquis for now, check AM DIC panel - Continue pred, tacro for now  Best Practice (right click and "Reselect all SmartList Selections" daily)   Diet/type: NPO DVT prophylaxis: DOAC GI prophylaxis: PPI Lines: N/A Foley:  N/A Code Status:  full code Last date of multidisciplinary goals of care discussion [if worsens will need to discuss]  33 min cc time Erskine Emery MD PCCM

## 2023-01-22 NOTE — Evaluation (Signed)
Occupational Therapy Re-Evaluation Patient Details Name: Russell Bowers MRN: [id number] DOB: [birth year] Today's Date: 01/22/2023   History of Present Illness Pt is a 72 y/o M presenting to ED on 1/23 with cough and SOB with exertion, found to be hypoxic at 78% on RA, COVID positive. Admitted for acute respiratory failure with hypoxia and hypercapnia and AKI. HD initiated 01/18/23. Intubated 1/30 and transferred to ICU, self-extubated 1/31. PMH includes chronic diastolic heart failure, DM2, ESRD with renal transplant on tacrolimus, aortic stenosis, OSA On BiPAP.   Clinical Impression   [name] was re-evaluated after being transferred to the ICU, he now presents with poor caridopulmonary endurance, increased respiratory support on HHFNC, impaired cognition, unsteady balance and increased assist needed for mobility and ADLs. Overall he needed up to mod A +2 for mobility with RW and extensive cues for initiation, sequencing and safety. He also requires up to mod A +2 for ADLs. OT to continue to follow acutely, care plan goals up dated. Now recommend d/c to AIR for maximal functional progress towards baseline.      Recommendations for follow up therapy are one component of a multi-disciplinary discharge planning process, led by the attending physician.  Recommendations may be updated based on patient status, additional functional criteria and insurance authorization.   Follow Up Recommendations  Acute inpatient rehab (3hours/day)     Assistance Recommended at Discharge Frequent or constant Supervision/Assistance  Patient can return home with the following A lot of help with walking and/or transfers;A lot of help with bathing/dressing/bathroom;Assistance with cooking/housework;Direct supervision/assist for medications management;Direct supervision/assist for financial management;Assist for transportation;Help with stairs or ramp for entrance    Functional Status Assessment  Patient has had a recent  decline in their functional status and demonstrates the ability to make significant improvements in function in a reasonable and predictable amount of time.  Equipment Recommendations  Other (comment) (defer)    Recommendations for Other Services       Precautions / Restrictions Precautions Precautions: Fall Precaution Comments: Dexter Restrictions Weight Bearing Restrictions: No      Mobility Bed Mobility Overal bed mobility: Needs Assistance Bed Mobility: Supine to Sit     Supine to sit: Min assist          Transfers Overall transfer level: Needs assistance Equipment used: Rolling walker (2 wheels) Transfers: Sit to/from Stand Sit to Stand: Mod assist, +2 physical assistance, +2 safety/equipment           General transfer comment: min-mod A +2 pt with posterior bias      Balance Overall balance assessment: Needs assistance Sitting-balance support: Bilateral upper extremity supported, Feet supported Sitting balance-Leahy Scale: Fair     Standing balance support: Bilateral upper extremity supported, During functional activity Standing balance-Leahy Scale: Poor                             ADL either performed or assessed with clinical judgement   ADL Overall ADL's : Needs assistance/impaired Eating/Feeding: Total assistance   Grooming: Minimal assistance;Sitting   Upper Body Bathing: Moderate assistance;Sitting   Lower Body Bathing: Moderate assistance;Sit to/from stand   Upper Body Dressing : Minimal assistance;Sitting   Lower Body Dressing: Moderate assistance;Sit to/from stand   Toilet Transfer: Minimal assistance;+2 for safety/equipment;+2 for physical assistance;Ambulation;Rolling walker (2 wheels)   Toileting- Clothing Manipulation and Hygiene: Maximal assistance;Sit to/from stand       Functional mobility during ADLs: Minimal assistance;+2 for physical assistance;+2 for safety/equipment;Moderate  assistance General ADL Comments:  limited by impiared cognition, poor balance, weakness and cardiopulmonary endurance     Vision Baseline Vision/History: 1 Wears glasses Vision Assessment?: No apparent visual deficits     Perception Perception Perception Tested?: No   Praxis Praxis Praxis tested?: Not tested    Pertinent Vitals/Pain Pain Assessment Pain Assessment: No/denies pain     Hand Dominance Right   Extremity/Trunk Assessment Upper Extremity Assessment Upper Extremity Assessment: Generalized weakness   Lower Extremity Assessment Lower Extremity Assessment: Defer to PT evaluation   Cervical / Trunk Assessment Cervical / Trunk Assessment: Normal   Communication Communication Communication: HOH   Cognition Arousal/Alertness: Awake/alert Behavior During Therapy: Impulsive Overall Cognitive Status: Impaired/Different from baseline Area of Impairment: Orientation, Attention, Memory, Following commands, Safety/judgement, Awareness, Problem solving                 Orientation Level: Disoriented to, Place, Time, Situation Current Attention Level: Sustained Memory: Decreased short-term memory, Decreased recall of precautions Following Commands: Follows one step commands consistently Safety/Judgement: Decreased awareness of safety, Decreased awareness of deficits Awareness: Intellectual Problem Solving: Slow processing, Requires verbal cues General Comments: oriented to self only, followed simple direct cues. impulsive with poor insight and safety awareness.     General Comments  HR to 131, tele alarming Vtach. SpO2 >90% on [facility]    Exercises     Shoulder Instructions      Home Living Family/patient expects to be discharged to:: Private residence Living Arrangements: Spouse/significant other Available Help at Discharge: Family;Available 24 hours/day Type of Home: House Home Access: Stairs to enter [employer] of Steps: 3 Entrance Stairs-Rails: Can reach both Home Layout:  One level     Bathroom Shower/Tub: Occupational psychologist: Standard Bathroom Accessibility: Yes How Accessible: Accessible via walker Home Equipment: Shower seat - built in;Grab bars - tub/shower   Additional Comments: wears Bipap at night      Prior Functioning/Environment Prior Level of Function : Independent/Modified Independent             Mobility Comments: no AD ADLs Comments: ind with ADLs        OT Problem List: Decreased strength;Decreased range of motion;Decreased activity tolerance;Impaired balance (sitting and/or standing);Decreased cognition;Decreased knowledge of use of DME or AE;Decreased knowledge of precautions;Decreased safety awareness;Cardiopulmonary status limiting activity      OT Treatment/Interventions: Self-care/ADL training;Therapeutic exercise;DME and/or AE instruction;Therapeutic activities;Patient/family education;Balance training    OT Goals(Current goals can be found in the care plan section) Acute Rehab OT Goals Patient Stated Goal: unable to state OT Goal Formulation: Patient unable to participate in goal setting Time For Goal Achievement: 02/01/23 Potential to Achieve Goals: Good ADL Goals Pt Will Perform Upper Body Dressing: with supervision;sitting Pt Will Perform Lower Body Dressing: with min assist;sit to/from stand Pt Will Transfer to Toilet: with min guard assist;ambulating Additional ADL Goal #1: Pt will indep sequence through a BADL  OT Frequency: Min 2X/week    Co-evaluation PT/OT/SLP Co-Evaluation/Treatment: Yes Reason for Co-Treatment: Complexity of the patient's impairments (multi-system involvement);For patient/therapist safety;To address functional/ADL transfers   OT goals addressed during session: ADL's and self-care      AM-PAC OT "6 Clicks" Daily Activity     Outcome Measure Help from another person eating meals?: Total Help from another person taking care of personal grooming?: A Little Help from  another person toileting, which includes using toliet, bedpan, or urinal?: A Lot Help from another person bathing (including washing, rinsing, drying)?: A Lot Help from  another person to put on and taking off regular upper body clothing?: A Little Help from another person to put on and taking off regular lower body clothing?: A Lot 6 Click Score: 13   End of Session Equipment Utilized During Treatment: Rolling walker (2 wheels) Nurse Communication: Mobility status  Activity Tolerance: Patient tolerated treatment well Patient left: in chair;with call bell/phone within reach;with chair alarm set  OT Visit Diagnosis: Unsteadiness on feet (R26.81);Other abnormalities of gait and mobility (R26.89);Muscle weakness (generalized) (M62.81)                Time: 9444-6190 OT Time Calculation (min): 30 min Charges:  OT General Charges $OT Visit: 1 Visit OT Evaluation $OT Eval Moderate Complexity: 1 Mod    Tameshia Bonneville D Causey 01/22/2023, 3:57 PM

## 2023-01-22 NOTE — Plan of Care (Signed)
Patient under the care of critical care team Case discussed with critical care MD Dr. Erskine Emery.  We will assume care once he is stable enough to be moved out of ICU.

## 2023-01-22 NOTE — Progress Notes (Signed)
PT Cancellation Note  Patient Details Name: SEM MCCAUGHEY MRN: [id number] DOB: Oct 01, 1951   Cancelled Treatment:    Reason Eval/Treat Not Completed: Patient at procedure or test/unavailable. Pt currently receiving HD treatment. Will plan to follow-up later today as time permits.   Moishe Spice, PT, DPT Acute Rehabilitation Services  Office: [place] 01/22/2023, 9:25 AM

## 2023-01-22 NOTE — Progress Notes (Signed)
eLink Physician-Brief Progress Note Patient Name: Russell Bowers DOB: 11/04/1951 MRN: [id number]   Date of Service  01/22/2023  HPI/Events of Note  Self extubated, satting mid 77s on NRB. Still intermittently agitated, precedex turned off, not tachypnic, oriented to self and situation; tracheobronchial malacia, +Covid  eICU Interventions  Try heated high flow at 60+L, if that fails, start Bipap  If somnolence doesn't improve in the next 30-45 mins, will need an ABG  Tolerating extubation for far 10-15 mins out     Intervention Category Major Interventions: Airway management  Praveen Coia 01/22/2023, 12:22 AM

## 2023-01-22 NOTE — Progress Notes (Signed)
Initial Nutrition Assessment  DOCUMENTATION CODES:   Not applicable  INTERVENTION:   If unable to advance diet, recommend Cortrak placement  If diet advanced, recommend Ensure Enlive po TID, each supplement provides 350 kcal and 20 grams of protein.  If pt has in fact not had a BM in >7 days, recommend aggressive bowel regimen  NUTRITION DIAGNOSIS:   Inadequate oral intake related to acute illness as evidenced by NPO status.  GOAL:   Patient will meet greater than or equal to 90% of their needs  MONITOR:   TF tolerance, Labs, Weight trends, Skin  REASON FOR ASSESSMENT:   Consult Enteral/tube feeding initiation and management  ASSESSMENT:   72 yo male admitted with acute respiratory failure with recent COVID-19, progressive AKI on CKD requiring RRT-possible progression to ESRD, transplant rejection.  PMH includes renal transplant for ESRD secondary to IgA nephropathy, DM, CHF  1/23 Admitted 1/30 rapid response, somnolent and confused, intubated, bronch 1/31 Self-extubated  Received dietitian consult for initiation and management of TF. Pt self-extubated however and currently as no enteral access.   Remains confused, on HHFNC-60 L 95% FiO2. Pt receiving iHD this AM Noted high risk fo re-intubation  Pt currently NPO, pt unsure at this time if pt will be appropriate for diet advancement. Limited documentation of meals prior to intubation but recorded po intake of meals 25-50%  No BM since 1/22 per documentation (9 days???)  Current wt 81.3 kg post iHD this AM. Pre iHD wt of 85.8 kg. Admit wt 88 kg  Labs: sodium 133 (L), phosphorus 5.0 Meds: calcitriol, folic acid, ss novolog, midodrine, prednisone, prograf, colace, miralax   Diet Order:   Diet Order             Diet NPO time specified  Diet effective now                   EDUCATION NEEDS:   Not appropriate for education at this time  Skin:  Skin Assessment: Reviewed RN Assessment  Last BM:   1/22  Height:   Ht Readings from Last 1 Encounters:  01/14/23 '5\' 9"'$  (1.753 m)    Weight:   Wt Readings from Last 1 Encounters:  01/22/23 81.3 kg     BMI:  Body mass index is 26.47 kg/m.  Estimated Nutritional Needs:   Kcal:  2100-2300 kacls  Protein:  110-130 g  Fluid:  1000 mL plus UOP   Kerman Passey MS, RDN, LDN, CNSC Registered Dietitian 3 Clinical Nutrition RD Pager and On-Call Pager Number Located in [place]

## 2023-01-22 NOTE — Progress Notes (Signed)
Received patient in bed to unit.  Alert and oriented.  Informed consent signed and in chart.   Treatment initiated: 08:24 Treatment completed: 12:03  Patient tolerated well.  Transported back to the room  Alert, without acute distress.  Hand-off given to patient's nurse.   Access used: Left AVF Access issues: one  Total UF removed: 2.5L Medication(s) given: none   01/22/23 1203  Vitals  Temp 97.9 F (36.6 C)  Temp Source Oral  BP 122/88  MAP (mmHg) 100  BP Location Right Arm  BP Method Automatic  Patient Position (if appropriate) Lying  Pulse Rate (!) 120  Pulse Rate Source Monitor  ECG Heart Rate (!) 109  Resp (!) 27  Oxygen Therapy  SpO2 98 %  O2 Device HFNC  During Treatment Monitoring  Intra-Hemodialysis Comments Tolerated well;Tx completed  Dialysis Fluid Bolus Normal Saline  Vascular Access Left Forearm Arteriovenous fistula  No Placement Date or Time found.   Orientation: Left  Access Location: Forearm  Access Type: Arteriovenous fistula  Status Deaccessed   Bed need to be zero out for accurate weight.    Ishani Goldwasser S [facility]

## 2023-01-23 DIAGNOSIS — J9601 Acute respiratory failure with hypoxia: Secondary | ICD-10-CM | POA: Diagnosis not present

## 2023-01-23 DIAGNOSIS — J9602 Acute respiratory failure with hypercapnia: Secondary | ICD-10-CM | POA: Diagnosis not present

## 2023-01-23 LAB — CBC WITH DIFFERENTIAL/PLATELET
Abs Immature Granulocytes: 0.13 10*3/uL — ABNORMAL HIGH (ref 0.00–0.07)
Basophils Absolute: 0 10*3/uL (ref 0.0–0.1)
Basophils Relative: 0 %
Eosinophils Absolute: 0.1 10*3/uL (ref 0.0–0.5)
Eosinophils Relative: 1 %
HCT: 38.5 % — ABNORMAL LOW (ref 39.0–52.0)
Hemoglobin: 12 g/dL — ABNORMAL LOW (ref 13.0–17.0)
Immature Granulocytes: 2 %
Lymphocytes Relative: 6 %
Lymphs Abs: 0.5 10*3/uL — ABNORMAL LOW (ref 0.7–4.0)
MCH: 31.3 pg (ref 26.0–34.0)
MCHC: 31.2 g/dL (ref 30.0–36.0)
MCV: 100.3 fL — ABNORMAL HIGH (ref 80.0–100.0)
Monocytes Absolute: 0.8 10*3/uL (ref 0.1–1.0)
Monocytes Relative: 10 %
Neutro Abs: 6.4 10*3/uL (ref 1.7–7.7)
Neutrophils Relative %: 81 %
Platelets: 57 10*3/uL — ABNORMAL LOW (ref 150–400)
RBC: 3.84 MIL/uL — ABNORMAL LOW (ref 4.22–5.81)
RDW: 15.5 % (ref 11.5–15.5)
WBC: 7.8 10*3/uL (ref 4.0–10.5)
nRBC: 0 % (ref 0.0–0.2)

## 2023-01-23 LAB — DIC (DISSEMINATED INTRAVASCULAR COAGULATION)PANEL
D-Dimer, Quant: 0.96 ug/mL-FEU — ABNORMAL HIGH (ref 0.00–0.50)
Fibrinogen: 251 mg/dL (ref 210–475)
INR: 1.7 — ABNORMAL HIGH (ref 0.8–1.2)
Platelets: 56 10*3/uL — ABNORMAL LOW (ref 150–400)
Prothrombin Time: 19.4 seconds — ABNORMAL HIGH (ref 11.4–15.2)
Smear Review: NONE SEEN
aPTT: 30 seconds (ref 24–36)

## 2023-01-23 LAB — GLUCOSE, CAPILLARY
Glucose-Capillary: 129 mg/dL — ABNORMAL HIGH (ref 70–99)
Glucose-Capillary: 140 mg/dL — ABNORMAL HIGH (ref 70–99)
Glucose-Capillary: 179 mg/dL — ABNORMAL HIGH (ref 70–99)
Glucose-Capillary: 265 mg/dL — ABNORMAL HIGH (ref 70–99)
Glucose-Capillary: 284 mg/dL — ABNORMAL HIGH (ref 70–99)
Glucose-Capillary: 363 mg/dL — ABNORMAL HIGH (ref 70–99)
Glucose-Capillary: 371 mg/dL — ABNORMAL HIGH (ref 70–99)
Glucose-Capillary: 416 mg/dL — ABNORMAL HIGH (ref 70–99)

## 2023-01-23 LAB — ACID FAST SMEAR (AFB, MYCOBACTERIA): Acid Fast Smear: NEGATIVE

## 2023-01-23 LAB — BASIC METABOLIC PANEL
Anion gap: 15 (ref 5–15)
BUN: 37 mg/dL — ABNORMAL HIGH (ref 8–23)
CO2: 25 mmol/L (ref 22–32)
Calcium: 9 mg/dL (ref 8.9–10.3)
Chloride: 95 mmol/L — ABNORMAL LOW (ref 98–111)
Creatinine, Ser: 3.81 mg/dL — ABNORMAL HIGH (ref 0.61–1.24)
GFR, Estimated: 16 mL/min — ABNORMAL LOW (ref >60)
Glucose, Bld: 128 mg/dL — ABNORMAL HIGH (ref 70–99)
Potassium: 3.4 mmol/L — ABNORMAL LOW (ref 3.5–5.1)
Sodium: 135 mmol/L (ref 135–145)

## 2023-01-23 LAB — C-REACTIVE PROTEIN: CRP: 2.2 mg/dL — ABNORMAL HIGH (ref <1.0)

## 2023-01-23 LAB — PATHOLOGIST SMEAR REVIEW

## 2023-01-23 LAB — PHOSPHORUS: Phosphorus: 4 mg/dL (ref 2.5–4.6)

## 2023-01-23 LAB — MAGNESIUM: Magnesium: 2 mg/dL (ref 1.7–2.4)

## 2023-01-23 MED ORDER — RENA-VITE PO TABS
1.0000 | ORAL_TABLET | Freq: Every day | ORAL | Status: DC
  Administered 2023-01-23 – 2023-01-26 (×4): 1
  Filled 2023-01-23 (×4): qty 1

## 2023-01-23 MED ORDER — NEPRO/CARBSTEADY PO LIQD
237.0000 mL | Freq: Two times a day (BID) | ORAL | Status: DC
  Administered 2023-01-23 – 2023-01-25 (×3): 237 mL via ORAL

## 2023-01-23 MED ORDER — INSULIN ASPART 100 UNIT/ML IJ SOLN
0.0000 [IU] | Freq: Three times a day (TID) | INTRAMUSCULAR | Status: DC
  Administered 2023-01-23: 9 [IU] via SUBCUTANEOUS
  Administered 2023-01-23: 2 [IU] via SUBCUTANEOUS
  Administered 2023-01-24: 7 [IU] via SUBCUTANEOUS
  Administered 2023-01-24: 2 [IU] via SUBCUTANEOUS
  Administered 2023-01-24: 5 [IU] via SUBCUTANEOUS
  Administered 2023-01-25 (×3): 3 [IU] via SUBCUTANEOUS
  Administered 2023-01-26: 1 [IU] via SUBCUTANEOUS
  Administered 2023-01-26: 2 [IU] via SUBCUTANEOUS
  Administered 2023-01-26: 3 [IU] via SUBCUTANEOUS
  Administered 2023-01-27: 5 [IU] via SUBCUTANEOUS

## 2023-01-23 MED ORDER — MELATONIN 3 MG PO TABS
3.0000 mg | ORAL_TABLET | Freq: Every day | ORAL | Status: DC
  Administered 2023-01-23 – 2023-01-24 (×2): 3 mg via ORAL
  Filled 2023-01-23 (×2): qty 1

## 2023-01-23 NOTE — Progress Notes (Signed)
NAME:  Russell Bowers, MRN:  [id number], DOB:  May 27, 1951, LOS: 9 ADMISSION DATE:  01/14/2023, CONSULTATION DATE: 01/21/2023 REFERRING MD: Candiss Norse, CHIEF COMPLAINT: Acute respiratory failure  History of Present Illness:  72 year old male with a plethora of health issues that are well-documented below. He was admitted 1/23 with acute hypoxic and hypercapnic respiratory failure, COVID 19 PNA/pneumonitis, UTI, new A.fib.   On 1/30, he was more restless and agitated. He had ABG drawn which demonstrated ongoing hypercapnic and hypoxic respiratory failure. He was placed on BiPAP and despite multiple changes and being on it for a few hours now, he has had essentially no change. He did receive Haldol and Ativan earlier; however, per RN, his mental status has remained about the same (restlessness/agitation did improve).  PCCM subsequently called in consultation for concerns that he will require intubation.  Pertinent  Medical History   Past Medical History:  Diagnosis Date   ADHD (attention deficit hyperactivity disorder)    Allergic rhinitis    Cancer ([place])    Prostate cancer   Cellulitis and abscess of trunk 2014   history of back abscess   Cellulitis and abscess of unspecified site 2014   lower back   Detached retina    Diabetes mellitus without complication (HCC)    Elevated troponin    Fistula    OF LEFT ARM   Gout    H/O kidney transplant    History of congestive heart failure    FOLLOWED BY DR. 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 Melvyn Novas   Hypertension    Mild concentric left ventricular hypertrophy (LVH)    AND AORTIC STENOSIS FOLLOWED IN THE PAST BY CARDIOLOGIST DR. Marlou Porch.   OSA treated with BiPAP    Personal history of colonic polyps    Prostate cancer ([place]) 11/2010   PROSTATECTOMY WITH UROLOGIST DR. DAVIS   Renal failure    Shortness  of breath    Sleep disorder breathing    FOLLOWED BY PULMONOLOGIST DR. Kendrick Hospital Events: Including procedures, antibiotic start and stop dates in addition to other pertinent events   1/30 ABG showing hypercapnic RF. BIPAP placed without significant improvement. PCCM consulted for intubation. 1/31 Self-extubated overnight.  2/1 Improved from a respiratory standpoint, HHFNC 55L/FiO2 75%.  Interim History / Subjective:  No significant events overnight Improving O2 needs Level of alertness improving as well. Still confused, wife at bedside  Objective   Blood pressure 126/77, pulse (!) 105, temperature 99 F (37.2 C), temperature source Oral, resp. rate (!) 23, height '5\' 9"'$  (1.753 m), weight 79.4 kg, SpO2 100 %.    FiO2 (%):  [75 %-95 %] 75 %   Intake/Output Summary (Last 24 hours) at 01/23/2023 0839 Last data filed at 01/23/2023 0700 Gross per 24 hour  Intake 467.74 ml  Output 2620 ml  Net -2152.26 ml    Filed Weights   01/22/23 0319 01/22/23 0804 01/23/23 0500  Weight: 85.8 kg 81.3 kg 79.4 kg   Physical Examination: Frail man in NAD MMM, trachea midline Heart sounds irregular: looks like PVCs on tele Ext warm Minimal edema AO to self/place/year but not situation  Bronch neg to date Plts slightly better  Assessment & Plan:  Acute hypoxemic respiratory failure - Bronch results most c/w fluid overload, delirium causing secretion clearance problems, and deconditioning being primary issues.  Bronchomalacia suspected on imaging and  confirmed on bronch.  S/p intubation/bronch and self extubation. On ceftriaxone/azithromycin.  Afebrile, no white count but immunosuppressed.  Overall improving with volume removal.  ICU delirium, metabolic encephalopathy - Improving, not yet completely resolved  AKI on CKD, hx renal transplant, now probably ESRD  Worsening thrombocytopenia - Unclear cause ?COVID or septic response  COVID+: ?Ancillary/incidental finding  -  Tolerating iHD, continue to push fluid removal as tolerated - Continue supplemental O2 support - Wean FiO2 for O2 sat > 90%: will talk to RT, currently saturating 100% - Pulmonary hygiene - DC abx - Continue Eliquis, watch plts - Continue ISP (tacrolimus, prednisone) - Encourage day/night cycles, family visits.  Re-orient as needed.  Will start qHS melatonin.  Stable for transfer to progressive, spoke with Dr. Candiss Norse, [facility] who will take back over care starting 01/24/23  [facility] 01/23/23 8:40 AM  Please see [email or web address] for pager details.  From 7A-7P if no response, please call [phone] After hours, please call ELink [phone]

## 2023-01-23 NOTE — Progress Notes (Signed)
Spoke to pt's wife via phone late yesterday afternoon and this morning. Yesterday, we discussed approval at [facility]. Pt and wife interested in a MWF spot if possible. Also discussed possible TCU option if pt appropriate and if pt really wants [facility] and possibly interested in home therapies. Wife and pt to discuss. Contacted wife this am to advise wife that [facility] has a MWF 2nd shift available if pt is interested. Also advised wife that pt could possibly be a candidate for TCU should pt be interested (discussed with TCU RN this am). Wife to discuss options with pt and return call to navigator. Update provided to [facility] admissions and [place] [facility]. Will assist as needed.   Melven Sartorius Renal Navigator [phone]

## 2023-01-23 NOTE — Progress Notes (Signed)
[facility] Progress Note  Assessment/ Plan: AKI on CKD5 progressing to ESRD - b/l creat 4.1 from sept 2023, eGFR 15 ml/min. Creat here 5.2 on admission in the setting of 1 month coughing, poor po intake and newly + here for COVID infection. BP's were low, CT w/o edema, CXR w/o edema. UA +wbcs, renal US unremarkable. Mild pedal edema on initial exam. Viral pna is likely given hypoxia. AKI felt to be intravasc vol depletion vs vol overload vs progression of CKD.  Tried cautious IVF's w/ no improvement in B/Cr or UOP, then tried IV lasix + albumin also w/o good response. B/Cr continue to rise and pt more lethargic and confused c/w uremia. Had 1st HD 1/27, 2nd finished 0400 on 1/29. Tolerated HD #4 2.5L on 1/31  Looks much better today and plan on HD Fri.  Appreciate renal navigator finding a TTS spot for him at [facility]. Prefers [address] [facility] and renal navigator is aware but no spots available.   Suspect he has progressed to ESRD; discussed also with Dr. Carolin Sicks who has been following the pt as an outpt in clinic for many years. Spouse hopeful for recovery and I explained to her last  night that he is likely ESRD and probably won't come off dialysis.  Pulm edema - w/ bilat rales, CXR 1/27 sig worse --> cont to lower vol w/ HD Renal transplant - done in 2012, w/ function slowly failing. He was CKD stage 5 in sept 2023. Taking only prograf at '2mg'$  am and '2mg'$  pm (pm dose decr from 3) because of high level 9.1 (3.0-8.0).  BP - BP's normal to low, started midodrine 10 tid COVID infection - suspected covid pna, per pmd Atrial fib - seen by cards DM2 per pmd  Subjective: UOP low as expected with HD. HD #1 2.6 L, #2 2.5, short tx #3 on 1/30 bec of agitation. Full treatment 1/31 2.5L UF. Much more conversant, convertible today, sitting in a bedside chair with spouse bedside. Updated spouse as well.   Vitals:   01/23/23 0600 01/23/23 0700 01/23/23 0741 01/23/23 0754  BP:  121/89 126/77   Pulse: (!)  125 (!) 107 (!) 105   Resp: (!) 26 (!) 25 (!) 23   Temp:    99 F (37.2 C)  TempSrc:    Oral  SpO2: 97% 100% 100%   Weight:      Height:        Exam: Gen alert, no distress +JVD to angle of jaw Chest dec'd L base, bilat rales bases RRR no MRG Abd soft ntnd no mass or ascites +bs Ext mild pretib/ pedal edema Neuro is alert, Ox 3 , nf, no asterixis  LUA AVF +thrill     Home meds include - allopurinol, sensipar 30 qd, rocaltrol 0.25 mcg qd, pepcid, lasix 40 qd, glipizide, insulin glargine, toprol xl 50, pravachol, prograf '2mg'$  am and '3mg'$  pm, prns/ vits/ supps       Date                          Creat               eGFR    2014                         2.1- 2.7    2015  1.5- 2.1    Feb- aug 2022         3.0- 3.5            18- 21    Sept 2022                 3.65                 17 ml/min    Sept 2023                 4.09                 15 ml/min    01/14/23                     5.37                     01/15/23                     5.27                 11 ml/min     CXR 1/23 - IMPRESSION: 1. Reticulonodular opacities bilaterally, which may reflect atypical/viral infection.   CR chest noncont - Lungs/Pleura: Central airways are patent. Bibasilar linear opacities similar to prior exam and likely due to scarring. Small bilateral pleural effusions    CXR 1/27 - marked worsening of bilat effusions and pulm edema    UCr 134, UNa < 10    UA - prot 100, many bact, 0-5 rbc, >50 wbc    Renal US transplant - normal [place], no hydro, nonspecific elevated intrarenal resistive indices.         Recent Labs  Lab 01/20/23 0040 01/21/23 0145 01/21/23 1751 01/22/23 0627 01/22/23 1801 01/23/23 0550  HGB 13.7 13.4   < > 12.8*  --  12.0*  ALBUMIN 4.2 3.7  --   --   --   --   CALCIUM 8.2* 8.5*  --  8.7*  --  9.0  PHOS 3.6 7.1*  --  5.0* 4.3 4.0  CREATININE 3.03* 4.99*  --  4.59*  --  3.81*  K 3.5 4.1   < > 4.0  --  3.4*   < > = values in this interval not displayed.    No results for input(s): "IRON", "TIBC", "FERRITIN" in the last 168 hours.  Inpatient medications:  allopurinol  100 mg Oral Daily   apixaban  2.5 mg Oral BID   calcitRIOL  0.25 mcg Oral Daily   Chlorhexidine Gluconate Cloth  6 each Topical Q0600   docusate sodium  100 mg Oral BID   folic acid  2 mg Oral Daily   insulin aspart  0-9 Units Subcutaneous TID WC   melatonin  3 mg Oral QHS   mouth rinse  15 mL Mouth Rinse 4 times per day   pantoprazole (PROTONIX) IV  40 mg Intravenous QHS   polyethylene glycol  17 g Oral Daily   pravastatin  10 mg Oral QHS   predniSONE  10 mg Oral Daily   sodium chloride flush  3 mL Intravenous Q12H   tacrolimus  2 mg Oral BID    sodium chloride Stopped (01/19/23 1701)   sodium chloride Stopped (01/22/23 1549)   azithromycin Stopped (01/22/23 1831)   cefTRIAXone (ROCEPHIN)  IV Stopped (01/22/23 1729)   norepinephrine (LEVOPHED) Adult infusion Stopped (01/21/23 1630)   sodium chloride, acetaminophen **OR**  acetaminophen, heparin, metoprolol tartrate, mouth rinse, polyethylene glycol, sodium chloride flush

## 2023-01-23 NOTE — Progress Notes (Signed)
Brief Nutrition Follow-up:  Pt remains on HFNC-oxygen requirements improving but still on 55L, 75%FiO2, diet advanced early this AM to Renal/Carb modified. PO intake prior to ICU tx around 25-50%.  Per report, mentation improved post iHD, 2.5L net UF yesterday Remains anuric (only 120 mL urine in 24 hours)  +large BM today  Current wt 79 kg, admit weight around 88 kg  Potassium wdl, phosphorus currently wdl-noted elevation at times.   Interventions:  1) Liberalize diet to Regular with 1200 mL fluid restriction for now 2) Nepro Shake po BID, each supplement provides 425 kcal and 19 grams protein 3) Add Renal MVI  Kerman Passey MS, RDN, LDN, CNSC Registered Dietitian 3 Clinical Nutrition RD Pager and On-Call Pager Number Located in [place]

## 2023-01-24 DIAGNOSIS — J9601 Acute respiratory failure with hypoxia: Secondary | ICD-10-CM | POA: Diagnosis not present

## 2023-01-24 DIAGNOSIS — J9602 Acute respiratory failure with hypercapnia: Secondary | ICD-10-CM | POA: Diagnosis not present

## 2023-01-24 LAB — C-REACTIVE PROTEIN: CRP: 1.2 mg/dL — ABNORMAL HIGH (ref <1.0)

## 2023-01-24 LAB — RENAL FUNCTION PANEL
Albumin: 3.7 g/dL (ref 3.5–5.0)
Anion gap: 13 (ref 5–15)
BUN: 53 mg/dL — ABNORMAL HIGH (ref 8–23)
CO2: 25 mmol/L (ref 22–32)
Calcium: 9.1 mg/dL (ref 8.9–10.3)
Chloride: 96 mmol/L — ABNORMAL LOW (ref 98–111)
Creatinine, Ser: 4.98 mg/dL — ABNORMAL HIGH (ref 0.61–1.24)
GFR, Estimated: 12 mL/min — ABNORMAL LOW (ref >60)
Glucose, Bld: 290 mg/dL — ABNORMAL HIGH (ref 70–99)
Phosphorus: 4 mg/dL (ref 2.5–4.6)
Potassium: 3.5 mmol/L (ref 3.5–5.1)
Sodium: 134 mmol/L — ABNORMAL LOW (ref 135–145)

## 2023-01-24 LAB — CBC WITH DIFFERENTIAL/PLATELET
Abs Immature Granulocytes: 0.12 10*3/uL — ABNORMAL HIGH (ref 0.00–0.07)
Basophils Absolute: 0 10*3/uL (ref 0.0–0.1)
Basophils Relative: 0 %
Eosinophils Absolute: 0.1 10*3/uL (ref 0.0–0.5)
Eosinophils Relative: 2 %
HCT: 37.3 % — ABNORMAL LOW (ref 39.0–52.0)
Hemoglobin: 12.1 g/dL — ABNORMAL LOW (ref 13.0–17.0)
Immature Granulocytes: 2 %
Lymphocytes Relative: 5 %
Lymphs Abs: 0.4 10*3/uL — ABNORMAL LOW (ref 0.7–4.0)
MCH: 32.2 pg (ref 26.0–34.0)
MCHC: 32.4 g/dL (ref 30.0–36.0)
MCV: 99.2 fL (ref 80.0–100.0)
Monocytes Absolute: 0.7 10*3/uL (ref 0.1–1.0)
Monocytes Relative: 9 %
Neutro Abs: 6.7 10*3/uL (ref 1.7–7.7)
Neutrophils Relative %: 82 %
Platelets: 57 10*3/uL — ABNORMAL LOW (ref 150–400)
RBC: 3.76 MIL/uL — ABNORMAL LOW (ref 4.22–5.81)
RDW: 15.4 % (ref 11.5–15.5)
WBC: 8.1 10*3/uL (ref 4.0–10.5)
nRBC: 0 % (ref 0.0–0.2)

## 2023-01-24 LAB — CBC
HCT: 37.8 % — ABNORMAL LOW (ref 39.0–52.0)
Hemoglobin: 12.1 g/dL — ABNORMAL LOW (ref 13.0–17.0)
MCH: 31.5 pg (ref 26.0–34.0)
MCHC: 32 g/dL (ref 30.0–36.0)
MCV: 98.4 fL (ref 80.0–100.0)
Platelets: 58 10*3/uL — ABNORMAL LOW (ref 150–400)
RBC: 3.84 MIL/uL — ABNORMAL LOW (ref 4.22–5.81)
RDW: 15.5 % (ref 11.5–15.5)
WBC: 8.4 10*3/uL (ref 4.0–10.5)
nRBC: 0 % (ref 0.0–0.2)

## 2023-01-24 LAB — CULTURE, RESPIRATORY W GRAM STAIN
Culture: NO GROWTH
Gram Stain: NONE SEEN

## 2023-01-24 LAB — GLUCOSE, CAPILLARY
Glucose-Capillary: 217 mg/dL — ABNORMAL HIGH (ref 70–99)
Glucose-Capillary: 260 mg/dL — ABNORMAL HIGH (ref 70–99)
Glucose-Capillary: 175 mg/dL — ABNORMAL HIGH (ref 70–99)
Glucose-Capillary: 301 mg/dL — ABNORMAL HIGH (ref 70–99)
Glucose-Capillary: 355 mg/dL — ABNORMAL HIGH (ref 70–99)

## 2023-01-24 MED ORDER — INSULIN GLARGINE-YFGN 100 UNIT/ML ~~LOC~~ SOLN
8.0000 [IU] | Freq: Every day | SUBCUTANEOUS | Status: DC
  Administered 2023-01-25 – 2023-01-26 (×3): 8 [IU] via SUBCUTANEOUS
  Filled 2023-01-24 (×7): qty 0.08

## 2023-01-24 MED ORDER — SODIUM CHLORIDE 0.9 % IV SOLN: 12.5000 mg | Freq: Three times a day (TID) | INTRAVENOUS | Status: DC | PRN

## 2023-01-24 NOTE — Inpatient Diabetes Management (Signed)
Inpatient Diabetes Program Recommendations  AACE/ADA: New Consensus Statement on Inpatient Glycemic Control (2015)  Target Ranges:  Prepandial:   less than 140 mg/dL      Peak postprandial:   less than 180 mg/dL (1-2 hours)      Critically ill patients:  140 - 180 mg/dL   Lab Results  Component Value Date   GLUCAP 260 (H) 01/24/2023   HGBA1C 7.7 (H) 01/15/2023    Latest Reference Range & Units 01/23/23 11:01 01/23/23 16:03 01/23/23 16:48 01/23/23 16:50 01/23/23 19:59 01/23/23 23:45 01/24/23 07:40  Glucose-Capillary 70 - 99 mg/dL 179 (H) 416 (H) 363 (H) 371 (H) 265 (H) 284 (H) 260 (H)  (H): Data is abnormally high  Diabetes history: Type 2 DM Outpatient Diabetes medications: Glipizide 5 mg QD, Lantus 18-20 units QHS Current orders for Inpatient glycemic control: Novolog 0-9 units tid, Prednisone 10 mg qd  Inpatient Diabetes Program Recommendations:   CBGs >180. Please consider: -Semglee 8 units qd (0.1 unit/kg x 83.4 kg)  Thank you, Bethena Roys E. Robynne Roat, RN, MSN, CDE  Diabetes Coordinator Inpatient Glycemic Control Team Team Pager [phone] (8am-5pm) 01/24/2023 9:34 AM

## 2023-01-24 NOTE — Progress Notes (Signed)
[facility] Progress Note  Assessment/ Plan: AKI on CKD5 progressing to ESRD - b/l creat 4.1 from sept 2023, eGFR 15 ml/min. Creat here 5.2 on admission in the setting of 1 month coughing, poor po intake and newly + here for COVID infection. BP's were low, CT w/o edema, CXR w/o edema. UA +wbcs, renal US unremarkable. Mild pedal edema on initial exam. Viral pna is likely given hypoxia. AKI felt to be intravasc vol depletion vs vol overload vs progression of CKD.  Tried cautious IVF's w/ no improvement in B/Cr or UOP, then tried IV lasix + albumin also w/o good response. B/Cr continue to rise and pt more lethargic and confused c/w uremia. Had 1st HD 1/27, 2nd finished 0400 on 1/29. Tolerated HD #4 2.5L on 1/31  Seen on HD through [facility] He's much more comfortable now and tolerating HD, MS markedly improved and not combative like the 1st HD treatment.  Appreciate renal navigator finding a MWF spot for him at [address]. Prefers [address] and renal navigator is aware but no spots available.   Suspect he has progressed to ESRD; discussed also with Dr. Carolin Sicks who has been following the pt as an outpt in clinic for many years. Spouse hopeful for recovery and I explained to her last  night that he is likely ESRD and probably won't come off dialysis.  Pulm edema - w/ bilat rales, CXR 1/27 sig worse --> cont to lower vol w/ HD Renal transplant - done in 2012, w/ function slowly failing. He was CKD stage 5 in sept 2023. Taking only prograf at '2mg'$  am and '2mg'$  pm (pm dose decr from 3) because of high level 9.1 (3.0-8.0).  BP - BP's normal to low, started midodrine 10 tid COVID infection - suspected covid pna, per pmd Atrial fib - seen by cards DM2 per pmd  Subjective: UOP low as expected with HD. HD #1 2.6 L, #2 2.5, short tx #3 on 1/30 bec of agitation. Full treatment 1/31 2.5L UF. Much more conversant today, in bed on dialysis and not moving his arm. Updated spouse as  well.   Vitals:   01/24/23 1016 01/24/23 1032 01/24/23 1046 01/24/23 1103  BP: (!) 141/62 131/78 (!) 145/85 129/84  Pulse: (!) 104 (!) 106 (!) 106 (!) 115  Resp: (!) 32 (!) 25 (!) 27 19  Temp:      TempSrc:      SpO2: 96% 96% 95% 96%  Weight:      Height:        Exam: Gen alert, no distress +JVD to angle of jaw Chest dec'd L base, bilat rhonchi RRR no MRG Abd soft ntnd no mass or ascites +bs Ext mild pretib/ pedal edema Neuro is alert, Ox 3 , nf, no asterixis  LUA AVF +thrill     Home meds include - allopurinol, sensipar 30 qd, rocaltrol 0.25 mcg qd, pepcid, lasix 40 qd, glipizide, insulin glargine, toprol xl 50, pravachol, prograf '2mg'$  am and '3mg'$  pm, prns/ vits/ supps       Date                          Creat               eGFR    2014                         2.1- 2.7  2015                         1.5- 2.1    Feb- aug 2022         3.0- 3.5            18- 21    Sept 2022                 3.65                 17 ml/min    Sept 2023                 4.09                 15 ml/min    01/14/23                     5.37                     01/15/23                     5.27                 11 ml/min     CXR 1/23 - IMPRESSION: 1. Reticulonodular opacities bilaterally, which may reflect atypical/viral infection.   CR chest noncont - Lungs/Pleura: Central airways are patent. Bibasilar linear opacities similar to prior exam and likely due to scarring. Small bilateral pleural effusions    CXR 1/27 - marked worsening of bilat effusions and pulm edema    UCr 134, UNa < 10    UA - prot 100, many bact, 0-5 rbc, >50 wbc    Renal US transplant - normal [place], no hydro, nonspecific elevated intrarenal resistive indices.         Recent Labs  Lab 01/21/23 0145 01/21/23 1751 01/23/23 0550 01/24/23 0919 01/24/23 0920  HGB 13.4   < > 12.0* 12.1* 12.1*  ALBUMIN 3.7  --   --   --  3.7  CALCIUM 8.5*   < > 9.0  --  9.1  PHOS 7.1*   < > 4.0  --  4.0  CREATININE 4.99*   < > 3.81*  --  4.98*   K 4.1   < > 3.4*  --  3.5   < > = values in this interval not displayed.   No results for input(s): "IRON", "TIBC", "FERRITIN" in the last 168 hours.  Inpatient medications:  allopurinol  100 mg Oral Daily   apixaban  2.5 mg Oral BID   calcitRIOL  0.25 mcg Oral Daily   Chlorhexidine Gluconate Cloth  6 each Topical Q0600   docusate sodium  100 mg Oral BID   feeding supplement (NEPRO CARB STEADY)  237 mL Oral BID BM   folic acid  2 mg Oral Daily   insulin aspart  0-9 Units Subcutaneous TID WC   insulin glargine-yfgn  8 Units Subcutaneous QHS   melatonin  3 mg Oral QHS   multivitamin  1 tablet Per Tube QHS   mouth rinse  15 mL Mouth Rinse 4 times per day   pantoprazole (PROTONIX) IV  40 mg Intravenous QHS   polyethylene glycol  17 g Oral Daily   pravastatin  10 mg Oral QHS   predniSONE  10 mg Oral Daily   sodium chloride flush  3 mL Intravenous Q12H   tacrolimus  2 mg Oral BID    sodium chloride Stopped (01/19/23 1701)   sodium chloride Stopped (01/22/23 1549)   azithromycin Stopped (01/23/23 1737)   cefTRIAXone (ROCEPHIN)  IV Stopped (01/23/23 1704)   sodium chloride, acetaminophen **OR** acetaminophen, metoprolol tartrate, mouth rinse, polyethylene glycol, sodium chloride flush

## 2023-01-24 NOTE — Progress Notes (Signed)
Physical Therapy Treatment Patient Details Name: Russell Bowers MRN: [id number] DOB: 10/30/1951 Today's Date: 01/24/2023   History of Present Illness Pt is a 72 y/o M presenting to ED on 1/23 with cough and SOB with exertion, found to be hypoxic at 78% on RA, COVID positive. Admitted for acute respiratory failure with hypoxia and hypercapnia and AKI. HD initiated 01/18/23. Intubated 1/30 and transferred to ICU, self-extubated 1/31. PMH includes chronic diastolic heart failure, DM2, ESRD with renal transplant on tacrolimus, aortic stenosis, OSA On BiPAP.    PT Comments    Pt is making good, gradual progress with mobility as he was able to progress to ambulating up to ~60 ft today. He continues to display deficits in cognition and balance though that place him at risk for falls and injury. Pt needs repeated cues to reduce his posterior lean and min-modA to ambulate. While ambulating, his HR went up to 147 bpm and his SpO2 remained >/= 90% on 6L O2 via [place]. Current recommendations remain appropriate. Will continue to follow acutely.    Recommendations for follow up therapy are one component of a multi-disciplinary discharge planning process, led by the attending physician.  Recommendations may be updated based on patient status, additional functional criteria and insurance authorization.  Follow Up Recommendations  Acute inpatient rehab (3hours/day)     Assistance Recommended at Discharge Frequent or constant Supervision/Assistance  Patient can return home with the following Assistance with cooking/housework;Assist for transportation;Help with stairs or ramp for entrance;A lot of help with walking and/or transfers;A lot of help with bathing/dressing/bathroom;Direct supervision/assist for financial management;Direct supervision/assist for medications management   Equipment Recommendations  Rolling walker (2 wheels)    Recommendations for Other Services       Precautions / Restrictions  Precautions Precautions: Fall Precaution Comments: watch SpO2 & HR, covid+ Restrictions Weight Bearing Restrictions: No     Mobility  Bed Mobility               General bed mobility comments: Pt sitting on commode upon arrival    Transfers Overall transfer level: Needs assistance Equipment used: Rolling walker (2 wheels) Transfers: Sit to/from Stand Sit to Stand: Min assist           General transfer comment: Pt needing repeated cues to push up from commode to stand rather than pull up on RW, minA to steady and transition weight anteriorly, x2 reps    Ambulation/Gait Ambulation/Gait assistance: Min assist, Mod assist Gait Distance (Feet): 60 Feet Assistive device: Rolling walker (2 wheels) Gait Pattern/deviations: Step-through pattern, Decreased stride length, Leaning posteriorly Gait velocity: reduced Gait velocity interpretation: <1.31 ft/sec, indicative of household ambulator   General Gait Details: Pt with slow gait, needing cues to keep hands on RW grips. Pt with hip extension but leaning posteriorly, needing repeated cues to shift weight anteriorly but then pt would flex at hips instead. Min-modA needed for stability with gait within room and cues to direct pt and RW.   Stairs             Wheelchair Mobility    Modified Rankin (Stroke Patients Only)       Balance Overall balance assessment: Needs assistance Sitting-balance support: Bilateral upper extremity supported, Feet supported Sitting balance-Leahy Scale: Fair   Postural control: Posterior lean Standing balance support: Bilateral upper extremity supported, During functional activity Standing balance-Leahy Scale: Poor Standing balance comment: Reliant on RW and external physical assistance  Cognition Arousal/Alertness: Awake/alert Behavior During Therapy: WFL for tasks assessed/performed Overall Cognitive Status: Impaired/Different from  baseline Area of Impairment: Attention, Memory, Following commands, Safety/judgement, Awareness, Problem solving                   Current Attention Level: Sustained Memory: Decreased short-term memory Following Commands: Follows one step commands consistently, Follows one step commands with increased time Safety/Judgement: Decreased awareness of safety, Decreased awareness of deficits Awareness: Emergent Problem Solving: Slow processing, Requires verbal cues General Comments: Pt needing increased time to process simple cues. Poor awareness of his posterior lean with difficulty correcting.        Exercises General Exercises - Lower Extremity Long Arc Quad: AROM, Strengthening, Both, Seated, 5 reps    General Comments General comments (skin integrity, edema, etc.): HR up to 147 bpm with gait, SpO2 >/= 90% on 6L with gait      Pertinent Vitals/Pain Pain Assessment Pain Assessment: Faces Faces Pain Scale: No hurt Pain Intervention(s): Monitored during session    Home Living                          Prior Function            PT Goals (current goals can now be found in the care plan section) Acute Rehab PT Goals Patient Stated Goal: did not state PT Goal Formulation: With patient/family Time For Goal Achievement: 02/05/23 Potential to Achieve Goals: Good Progress towards PT goals: Progressing toward goals    Frequency    Min 3X/week      PT Plan Current plan remains appropriate    Co-evaluation              AM-PAC PT "6 Clicks" Mobility   Outcome Measure  Help needed turning from your back to your side while in a flat bed without using bedrails?: A Little Help needed moving from lying on your back to sitting on the side of a flat bed without using bedrails?: A Little Help needed moving to and from a bed to a chair (including a wheelchair)?: A Little Help needed standing up from a chair using your arms (e.g., wheelchair or bedside chair)?:  A Little Help needed to walk in hospital room?: A Lot Help needed climbing 3-5 steps with a railing? : Total 6 Click Score: 15    End of Session Equipment Utilized During Treatment: Gait belt;Oxygen Activity Tolerance: Patient tolerated treatment well Patient left: in chair;with family/visitor present;with nursing/sitter in room;Other (comment) (in w/c ready to be transferred to new floor) Nurse Communication: Mobility status;Other (comment) (vitals) PT Visit Diagnosis: Unsteadiness on feet (R26.81);Difficulty in walking, not elsewhere classified (R26.2);Other abnormalities of gait and mobility (R26.89);Muscle weakness (generalized) (M62.81)     Time: 4174-0814 PT Time Calculation (min) (ACUTE ONLY): 22 min  Charges:  $Gait Training: 8-22 mins                     Moishe Spice, PT, DPT Acute Rehabilitation Services  Office: [place] 01/24/2023, 2:31 PM

## 2023-01-24 NOTE — Progress Notes (Signed)
   01/24/23 1442  Assess: MEWS Score  Temp 98.7 F (37.1 C)  BP (!) 152/88  MAP (mmHg) 107  Pulse Rate (!) 117  Resp (!) 22  SpO2 100 %  O2 Device HFNC  O2 Flow Rate (L/min) 5 L/min  Assess: MEWS Score  MEWS Temp 0  MEWS Systolic 0  MEWS Pulse 2  MEWS RR 1  MEWS LOC 0  MEWS Score 3  MEWS Score Color Yellow  Assess: if the MEWS score is Yellow or Red  Were vital signs taken at a resting state? Yes  Focused Assessment No change from prior assessment  Does the patient meet 2 or more of the SIRS criteria? No  Provider and Rapid Response Notified? No  MEWS guidelines implemented *See Row Information* Yes  Treat  Pain Scale 0-10  Pain Score 0  Take Vital Signs  Increase Vital Sign Frequency  Yellow: Q 2hr X 2 then Q 4hr X 2, if remains yellow, continue Q 4hrs  Escalate  MEWS: Escalate Yellow: discuss with charge nurse/RN and consider discussing with provider and RRT  Notify: Charge Nurse/RN  Name of Charge Nurse/RN Notified Londra, RN  Date Charge Nurse/RN Notified 01/24/23  Time Charge Nurse/RN Notified 1600  Document  Patient Outcome Stabilized after interventions  Progress note created (see row info) Yes  Assess: SIRS CRITERIA  SIRS Temperature  0  SIRS Pulse 1  SIRS Respirations  1  SIRS WBC 0  SIRS Score Sum  2

## 2023-01-24 NOTE — Progress Notes (Signed)
Received patient I his bed.Alert and oriented  x 3 he is on HFNC  @ 30LPM. Vitals were stable.Patient's wife at the bedside.  Accessed used : Left lower arm fistula that worked well .Two big aneurism noted.  Duration of treatment :3.5 hours  Medicine given. Heparin 2,000 units bolus.  Fluid removed. Met the prescribed UF goal of 2,000 cc.  Hemodialysis issue.: None   Hand off to the patient's nurse.

## 2023-01-24 NOTE — Progress Notes (Signed)
PROGRESS NOTE    Russell Bowers  YQM:[id number] DOB: [birth year] DOA: 01/14/2023 PCP: [name], MD    Brief Narrative:  72 year old with multiple medical issues including diabetes, history of kidney transplant and now with CKD, obstructive sleep apnea who was admitted to the medical floor on 1/23 with acute hypoxemic and hypercapnic respiratory failure secondary to COVID-19 pneumonia or pneumonitis, UTI and A-fib. 1/30, he was more restless and agitated.  He was found to be hypercapnic, placed on BiPAP with no response so transferred to intensive care unit.  Patient was intubated in the intensive care unit. 1/31, self extubated overnight. 2/1, improved from respiratory standpoint and remains on high flow nasal cannula oxygen.  Kidney function continues to deteriorate.  Nephrology following. 2/2, transferred to medical floor.   Assessment & Plan:   Acute on chronic hypoxemic and hypercapnic respiratory failure: Fluid overload, unable to clear secretions and deconditioning.  Treated with broad-spectrum antibiotics ceftriaxone and azithromycin. as needed bronchodilators, deep breathing exercises, incentive spirometry, chest physiotherapy and PT OT. Continue antibiotics. Supplemental oxygen to keep saturations more than 90%.   AKI on CKD 5 progressing to ESRD: Patient progressed to ESRD.  Started on hemodialysis.  Outpatient dialysis scheduling in progress MWF at [facility].  Probable discharge once oxygenation improves.  Followed by neurology.  Renal transplant: Failed renal transplant.  Remains on prednisone 10 mg daily, tacrolimus.  Essential hypertension: Blood pressure low normal.  Continue to hold antihypertensives.  COVID-19 pneumonia: Symptomatic treatment.  Breathing treatments and mobility. In-hospital isolation for 21 days due to severe pneumonia requiring intubation.  Chronic atrial fibrillation: Rate controlled.  Patient on Eliquis and therapeutic.  Diabetes type 2:  On glipizide and insulin at home.  Oral intake is improving.  Start patient on long-acting insulin as well as sliding scale insulin.  Encourage oral intake.  Liberalized to regular diet.   DVT prophylaxis: apixaban (ELIQUIS) tablet 2.5 mg Start: 01/22/23 1000 apixaban (ELIQUIS) tablet 2.5 mg   Code Status: Full code Family Communication: Wife at the bedside Disposition Plan: Status is: Inpatient Remains inpatient appropriate because: High flow oxygen, inpatient dialysis need Stable to transfer to progressive unit.     Consultants:  Nephrology Critical care  Procedures:  Hemodialysis  Antimicrobials:  Rocephin azithromycin   Subjective: Patient seen and examined in the intensive care unit.  He is getting dialysis.  Wife at the bedside.  Patient tells me he feels much better.  Denies any shortness of breath at rest.  He was happy to know that his oxygen is improving.  Appetite is improving and he ate about half of the meal.  Patient tells me he still feels cloudy at times.  No other overnight events as per nursing staff.  Objective: Vitals:   01/24/23 1016 01/24/23 1032 01/24/23 1046 01/24/23 1103  BP: (!) 141/62 131/78 (!) 145/85 129/84  Pulse: (!) 104 (!) 106 (!) 106 (!) 115  Resp: (!) 32 (!) 25 (!) 27 19  Temp:      TempSrc:      SpO2: 96% 96% 95% 96%  Weight:      Height:        Intake/Output Summary (Last 24 hours) at 01/24/2023 1105 Last data filed at 01/24/2023 0600 Gross per 24 hour  Intake 419.98 ml  Output 500 ml  Net -80.02 ml   Filed Weights   01/23/23 0500 01/24/23 0400 01/24/23 0850  Weight: 79.4 kg 83.4 kg 83.6 kg    Examination:  General exam: Appears calm  and comfortable.  Appropriately anxious.  Frail. Respiratory system: Poor bilateral air entry.  Not in any distress.  Currently on high flow nasal cannula oxygen with 40 L flowing. SpO2: 96 % O2 Flow Rate (L/min): 40 L/min FiO2 (%): 40 %  Cardiovascular system: S1 & S2 heard, RRR.  1+ bilateral  pedal edema.   Gastrointestinal system: Abdomen is nondistended, soft and nontender. No organomegaly or masses felt. Normal bowel sounds heard. Central nervous system: Alert and oriented. No focal neurological deficits.  Generalized weakness. Extremities: Symmetric 5 x 5 power. Left upper extremity AV fistula, currently receiving dialysis.    Data Reviewed: I have personally reviewed following labs and imaging studies  CBC: Recent Labs  Lab 01/20/23 0040 01/21/23 0145 01/21/23 1751 01/22/23 0141 01/22/23 0627 01/23/23 0550 01/24/23 0919 01/24/23 0920  WBC 9.8 6.9  --   --  7.0 7.8 8.1 8.4  NEUTROABS 8.2* 5.5  --   --  5.7 6.4 6.7  --   HGB 13.7 13.4   < > 13.6 12.8* 12.0* 12.1* 12.1*  HCT 44.7 44.3   < > 40.0 41.9 38.5* 37.3* 37.8*  MCV 103.5* 104.7*  --   --  102.4* 100.3* 99.2 98.4  PLT 75* 62*  --   --  51* 57*  56* 57* 58*   < > = values in this interval not displayed.   Basic Metabolic Panel: Recent Labs  Lab 01/20/23 0040 01/21/23 0145 01/21/23 1751 01/22/23 0141 01/22/23 0627 01/22/23 1801 01/23/23 0550 01/24/23 0920  NA 133* 134* 132* 132* 133*  --  135 134*  K 3.5 4.1 3.8 3.7 4.0  --  3.4* 3.5  CL 96* 97*  --   --  93*  --  95* 96*  CO2 28 31  --   --  23  --  25 25  GLUCOSE 121* 89  --   --  165*  --  128* 290*  BUN 34* 62*  --   --  50*  --  37* 53*  CREATININE 3.03* 4.99*  --   --  4.59*  --  3.81* 4.98*  CALCIUM 8.2* 8.5*  --   --  8.7*  --  9.0 9.1  MG 1.8 2.0  --   --  2.0 1.9 2.0  --   PHOS 3.6 7.1*  --   --  5.0* 4.3 4.0 4.0   GFR: Estimated Creatinine Clearance: 13.6 mL/min (A) (by C-G formula based on SCr of 4.98 mg/dL (H)). Liver Function Tests: Recent Labs  Lab 01/18/23 0032 01/19/23 0045 01/20/23 0040 01/21/23 0145 01/24/23 0920  AST 16 14* 29 18  --   ALT 12 14 37 26  --   ALKPHOS 61 45 55 47  --   BILITOT 0.5 0.5 0.8 0.8  --   PROT 6.5 5.7* 6.5 5.9*  --   ALBUMIN 4.1 3.8 4.2 3.7 3.7   No results for input(s): "LIPASE",  "AMYLASE" in the last 168 hours. No results for input(s): "AMMONIA" in the last 168 hours. Coagulation Profile: Recent Labs  Lab 01/23/23 0550  INR 1.7*   Cardiac Enzymes: No results for input(s): "CKTOTAL", "CKMB", "CKMBINDEX", "TROPONINI" in the last 168 hours. BNP (last 3 results) No results for input(s): "PROBNP" in the last 8760 hours. HbA1C: No results for input(s): "HGBA1C" in the last 72 hours. CBG: Recent Labs  Lab 01/23/23 1648 01/23/23 1650 01/23/23 1959 01/23/23 2345 01/24/23 0740  GLUCAP 363* 371* 265* 284* 260*  Lipid Profile: No results for input(s): "CHOL", "HDL", "LDLCALC", "TRIG", "CHOLHDL", "LDLDIRECT" in the last 72 hours. Thyroid Function Tests: No results for input(s): "TSH", "T4TOTAL", "FREET4", "T3FREE", "THYROIDAB" in the last 72 hours. Anemia Panel: No results for input(s): "VITAMINB12", "FOLATE", "FERRITIN", "TIBC", "IRON", "RETICCTPCT" in the last 72 hours. Sepsis Labs: No results for input(s): "PROCALCITON", "LATICACIDVEN" in the last 168 hours.  Recent Results (from the past 240 hour(s))  Resp panel by RT-PCR (RSV, Flu A&B, Covid) Anterior Nasal Swab     Status: Abnormal   Collection Time: 01/14/23  1:33 PM   Specimen: Anterior Nasal Swab  Result Value Ref Range Status   SARS Coronavirus 2 by RT PCR POSITIVE (A) NEGATIVE Final    Comment: (NOTE) SARS-CoV-2 target nucleic acids are DETECTED.  The SARS-CoV-2 RNA is generally detectable in upper respiratory specimens during the acute phase of infection. Positive results are indicative of the presence of the identified virus, but do not rule out bacterial infection or co-infection with other pathogens not detected by the test. Clinical correlation with patient history and other diagnostic information is necessary to determine patient infection status. The expected result is Negative.  Fact Sheet for Patients: EntrepreneurPulse.com.au  Fact Sheet for Healthcare  Providers: IncredibleEmployment.be  This test is not yet approved or cleared by the [place] FDA and  has been authorized for detection and/or diagnosis of SARS-CoV-2 by FDA under an Emergency Use Authorization (EUA).  This EUA will remain in effect (meaning this test can be used) for the duration of  the COVID-19 declaration under Section 564(b)(1) of the A ct, 21 U.S.C. section 360bbb-3(b)(1), unless the authorization is terminated or revoked sooner.     Influenza A by PCR NEGATIVE NEGATIVE Final   Influenza B by PCR NEGATIVE NEGATIVE Final    Comment: (NOTE) The Xpert Xpress SARS-CoV-2/FLU/RSV plus assay is intended as an aid in the diagnosis of influenza from Nasopharyngeal swab specimens and should not be used as a sole basis for treatment. Nasal washings and aspirates are unacceptable for Xpert Xpress SARS-CoV-2/FLU/RSV testing.  Fact Sheet for Patients: EntrepreneurPulse.com.au  Fact Sheet for Healthcare Providers: IncredibleEmployment.be  This test is not yet approved or cleared by the [place] FDA and has been authorized for detection and/or diagnosis of SARS-CoV-2 by FDA under an Emergency Use Authorization (EUA). This EUA will remain in effect (meaning this test can be used) for the duration of the COVID-19 declaration under Section 564(b)(1) of the Act, 21 U.S.C. section 360bbb-3(b)(1), unless the authorization is terminated or revoked.     Resp Syncytial Virus by PCR NEGATIVE NEGATIVE Final    Comment: (NOTE) Fact Sheet for Patients: EntrepreneurPulse.com.au  Fact Sheet for Healthcare Providers: IncredibleEmployment.be  This test is not yet approved or cleared by the [place] FDA and has been authorized for detection and/or diagnosis of SARS-CoV-2 by FDA under an Emergency Use Authorization (EUA). This EUA will remain in effect (meaning this test can be  used) for the duration of the COVID-19 declaration under Section 564(b)(1) of the Act, 21 U.S.C. section 360bbb-3(b)(1), unless the authorization is terminated or revoked.  Performed at [facility], [address], [place], [place] [address]   Blood culture (routine x 2)     Status: None   Collection Time: 01/14/23  1:47 PM   Specimen: BLOOD  Result Value Ref Range Status   Specimen Description   Final    BLOOD BLOOD RIGHT FOREARM Performed at [facility], [place],  [place] [address]    Special Requests   Final    Blood Culture adequate volume BOTTLES DRAWN AEROBIC ONLY Performed at [facility], [address], [place], [place] [address]    Culture   Final    NO GROWTH 5 DAYS Performed at [facility], [place] [address], [place], [place] [address]    Report Status 01/19/2023 FINAL  Final  Blood culture (routine x 2)     Status: None   Collection Time: 01/14/23  1:49 PM   Specimen: BLOOD  Result Value Ref Range Status   Specimen Description   Final    BLOOD RIGHT ANTECUBITAL Performed at [facility], [address], [place], [place] [address]    Special Requests   Final    Blood Culture adequate volume BOTTLES DRAWN AEROBIC AND ANAEROBIC Performed at [facility], [address], [place], [place] [address]    Culture   Final    NO GROWTH 5 DAYS Performed at [facility], [place] [address], [place], [place] [address]    Report Status 01/19/2023 FINAL  Final  MRSA Next Gen by PCR, Nasal     Status: None   Collection Time: 01/15/23 12:46 AM   Specimen: Nasal Mucosa; Nasal Swab  Result Value Ref Range Status   MRSA by PCR Next Gen NOT DETECTED NOT DETECTED Final    Comment: (NOTE) The GeneXpert MRSA Assay (FDA approved for NASAL specimens only), is one component of a comprehensive MRSA colonization surveillance program. It is not intended to  diagnose MRSA infection nor to guide or monitor treatment for MRSA infections. Test performance is not FDA approved in patients less than 44 years old. Performed at [facility], [place] [address], [place], [place] [address]   Urine Culture     Status: None   Collection Time: 01/15/23  6:34 AM   Specimen: Urine, Clean Catch  Result Value Ref Range Status   Specimen Description   Final    URINE, CLEAN CATCH Performed at [facility], [place] [address] [place], [place] [address]    Special Requests   Final    NONE Performed at [facility], [place] [address] [place], [place] [address]    Culture   Final    NO GROWTH Performed at [facility], [place] [address] [place], [place] [address]    Report Status 01/16/2023 FINAL  Final  Culture, Respiratory w Gram Stain     Status: None   Collection Time: 01/21/23  4:43 PM   Specimen: Bronchoalveolar Lavage; Respiratory  Result Value Ref Range Status   Specimen Description BRONCHIAL ALVEOLAR LAVAGE  Final   Special Requests NONE  Final   Gram Stain NO WBC SEEN NO ORGANISMS SEEN   Final   Culture   Final    NO GROWTH 2 DAYS Performed at [facility], [address], [place], [place] [address]    Report Status 01/24/2023 FINAL  Final  Acid Fast Smear (AFB)     Status: None   Collection Time: 01/21/23  4:43 PM   Specimen: Bronchoalveolar Lavage; Respiratory  Result Value Ref Range Status   AFB Specimen Processing Concentration  Final   Acid Fast Smear Negative  Final    Comment: (NOTE) Performed At: [facility] [place], [place] [id number] Rush Farmer MD JA:[id number]    Source (AFB) BRONCHIAL ALVEOLAR LAVAGE  Final    Comment: Performed at  [facility], [place] [address] [place], [place] [address]         Radiology Studies: No results found.      Scheduled Meds:  allopurinol  100 mg Oral Daily   apixaban  2.5 mg Oral BID    calcitRIOL  0.25 mcg Oral Daily   Chlorhexidine Gluconate Cloth  6 each Topical Q0600   docusate sodium  100 mg Oral BID   feeding supplement (NEPRO CARB STEADY)  237 mL Oral BID BM   folic acid  2 mg Oral Daily   insulin aspart  0-9 Units Subcutaneous TID WC   insulin glargine-yfgn  8 Units Subcutaneous QHS   melatonin  3 mg Oral QHS   multivitamin  1 tablet Per Tube QHS   mouth rinse  15 mL Mouth Rinse 4 times per day   pantoprazole (PROTONIX) IV  40 mg Intravenous QHS   polyethylene glycol  17 g Oral Daily   pravastatin  10 mg Oral QHS   predniSONE  10 mg Oral Daily   sodium chloride flush  3 mL Intravenous Q12H   tacrolimus  2 mg Oral BID   Continuous Infusions:  sodium chloride Stopped (01/19/23 1701)   sodium chloride Stopped (01/22/23 1549)   azithromycin Stopped (01/23/23 1737)   cefTRIAXone (ROCEPHIN)  IV Stopped (01/23/23 1704)     LOS: 10 days    Time spent: 50 minutes    Barb Merino, MD [facility] Pager [phone]

## 2023-01-24 NOTE — Progress Notes (Signed)
Pt removed from [facility] and placed on 5L [place]. Tolerating well at this time.    01/24/23 1400  Therapy Vitals  Pulse Rate (!) 118  Resp (!) 26  BP (!) 155/80  MEWS Score/Color  MEWS Score 4  MEWS Score Color Red  Oxygen Therapy/Pulse Ox  O2 Device (S)  HFNC ([place])  O2 Therapy Oxygen humidified  O2 Flow Rate (L/min) 5 L/min  SpO2 99 %

## 2023-01-24 NOTE — TOC Progression Note (Signed)
Transition of Care [facility]) - Progression Note    Patient Details  Name: Russell Bowers MRN: [id number] Date of Birth: 10/13/1951  Transition of Care [facility]) CM/SW Contact  Graves-[place]elow, Ocie Cornfield, RN Phone Number: 01/24/2023, 1:19 PM  Clinical Narrative: Patient was discussed in progression rounds. Patient continues on HFNC. PT/OT recommendations for CIR- previous Case Manager arranged [place] [facility] with Alvis Lemmings. CIR is following to monitor progress to see if the patient will become a candidate.  Case Manager will continue to follow for additional transition of  care needs.   Expected Discharge Plan: [place] Barriers to Discharge: Continued Medical Work up  Expected Discharge Plan and Services In-house Referral: NA Discharge Planning Services: CM Consult Post Acute Care Choice: [place] arrangements for the past 2 months: Single Family Home                 DME Arranged: [place] rolling DME Agency: [employer] Date DME Agency Contacted: 01/20/23 Time DME Agency Contacted: 1212 Representative spoke with at DME Agency: parachute [place]: PT [place]: [place] Date [place]: 01/20/23 Time [place]: 1093 Representative spoke with at [place]: [place] ([place]) Interventions SDOH Screenings   Tobacco Use: Medium Risk (01/14/2023)    Readmission Risk Interventions    01/20/2023   12:09 PM  Readmission Risk Prevention Plan  Transportation Screening Complete  PCP or Specialist Appt within 3-5 Days Complete  HRI or Home Care Consult Complete  Palliative Care Screening Not Applicable  Medication Review (RN Care Manager) Complete

## 2023-01-24 NOTE — Progress Notes (Signed)
Pt has been accepted at [facility] on MWF 12:30 chair time. Pt's start date will need to be coordinated with the clinic according to pt's d/c date. Spoke to pt's wife via phone and she is aware and agreeable to arrangements. Update provided to nephrologist. Will assist as needed.   Melven Sartorius Renal Navigator [phone]

## 2023-01-24 NOTE — Progress Notes (Signed)
Initial Nutrition Assessment  DOCUMENTATION CODES:   Not applicable  INTERVENTION:   Continue Regular diet with 1200 mL fluid restriction  Continue Nepro Shake po BID, each supplement provides 425 kcal and 19 grams protein. Ok to substitute [employer] if pt's preferred flavor not available in Nepro  Continue Renal MVI-educated pt and wife on importance of taking daily post discharge  Provided verbal education to pt and wife on the National Kidney Diet, reinforced importance of adequate po intake currently, encouraged protein.   NUTRITION DIAGNOSIS:   Inadequate oral intake related to acute illness as evidenced by NPO status.  Being addressed via supplements, diet  GOAL:   Patient will meet greater than or equal to 90% of their needs  Progressing  MONITOR:   TF tolerance, Labs, Weight trends, Skin  REASON FOR ASSESSMENT:   Consult Enteral/tube feeding initiation and management  ASSESSMENT:   72 yo male admitted with acute respiratory failure with recent COVID-19, progressive AKI on CKD requiring RRT-possible progression to ESRD, transplant rejection.  PMH includes renal transplant for ESRD secondary to IgA nephropathy, DM, CHF  1/23 Admitted 1/30 rapid response, somnolent and confused, intubated, bronch 1/31 Self-extubated  Pt currently receiving iHD. Currently off HFNC, on non-rebreather at present  Pt reports appetite has been fair but picking up today. Wife reports pt ate the best today he has eaten since admission. Reports 75% at breakfast.   Pt likes Ensure/Nepro and will drink. Pt prefers Chocolate  Wife reports pt was eating well for the most part PTA. Reports illness came on quickly  Current wt post iHD today 81.6 kg; lowest wt this admission 79.4 kg. Admit wt around 88 kg. Unsure of dry weight at this time  Labs: CBGs 175-416 Meds: ss novolog, semglee, folic acid, miralax, colace, Renal MVI, prednisone  NUTRITION - FOCUSED PHYSICAL  EXAM:  Flowsheet Row Most Recent Value  Orbital Region Moderate depletion  Upper Arm Region Unable to assess  Thoracic and Lumbar Region Moderate depletion  Buccal Region Mild depletion  Temple Region Mild depletion  Clavicle Bone Region Mild depletion  Clavicle and Acromion Bone Region Moderate depletion  Scapular Bone Region Moderate depletion  Dorsal Hand Unable to assess  Patellar Region Moderate depletion  Anterior Thigh Region Moderate depletion  Posterior Calf Region Unable to assess  Edema (RD Assessment) Moderate       Diet Order:   Diet Order             Diet regular Room service appropriate? Yes; Fluid consistency: Thin; Fluid restriction: 1200 mL Fluid  Diet effective now                   EDUCATION NEEDS:   Not appropriate for education at this time  Skin:  Skin Assessment: Reviewed RN Assessment  Last BM:  2/02  Height:   Ht Readings from Last 1 Encounters:  01/14/23 '5\' 9"'$  (1.753 m)    Weight:   Wt Readings from Last 1 Encounters:  01/24/23 81.6 kg     BMI:  Body mass index is 26.57 kg/m.  Estimated Nutritional Needs:   Kcal:  2100-2300 kacls  Protein:  110-130 g  Fluid:  1000 mL plus UOP    Kerman Passey MS, RDN, LDN, CNSC Registered Dietitian 3 Clinical Nutrition RD Pager and On-Call Pager Number Located in [place]

## 2023-01-25 ENCOUNTER — Inpatient Hospital Stay (HOSPITAL_COMMUNITY): Payer: Medicare Other

## 2023-01-25 DIAGNOSIS — J9602 Acute respiratory failure with hypercapnia: Secondary | ICD-10-CM | POA: Diagnosis not present

## 2023-01-25 DIAGNOSIS — J9601 Acute respiratory failure with hypoxia: Secondary | ICD-10-CM | POA: Diagnosis not present

## 2023-01-25 DIAGNOSIS — E440 Moderate protein-calorie malnutrition: Secondary | ICD-10-CM | POA: Diagnosis present

## 2023-01-25 LAB — POCT I-STAT 7, (LYTES, BLD GAS, ICA,H+H)
Acid-base deficit: 2 mmol/L (ref 0.0–2.0)
Acid-base deficit: 2 mmol/L (ref 0.0–2.0)
Bicarbonate: 31.8 mmol/L — ABNORMAL HIGH (ref 20.0–28.0)
Bicarbonate: 31.9 mmol/L — ABNORMAL HIGH (ref 20.0–28.0)
Calcium, Ion: 1.18 mmol/L (ref 1.15–1.40)
Calcium, Ion: 1.35 mmol/L (ref 1.15–1.40)
HCT: 51 % (ref 39.0–52.0)
HCT: 48 % (ref 39.0–52.0)
Hemoglobin: 17.3 g/dL — ABNORMAL HIGH (ref 13.0–17.0)
Hemoglobin: 16.3 g/dL (ref 13.0–17.0)
O2 Saturation: 94 %
O2 Saturation: 86 %
Patient temperature: 98
Patient temperature: 96.4
Potassium: 4.4 mmol/L (ref 3.5–5.1)
Potassium: 4.6 mmol/L (ref 3.5–5.1)
Sodium: 136 mmol/L (ref 135–145)
Sodium: 135 mmol/L (ref 135–145)
TCO2: 35 mmol/L — ABNORMAL HIGH (ref 22–32)
TCO2: 35 mmol/L — ABNORMAL HIGH (ref 22–32)
pCO2 arterial: 102.3 mmHg (ref 32–48)
pCO2 arterial: 103.6 mmHg (ref 32–48)
pH, Arterial: 7.098 — CL (ref 7.35–7.45)
pH, Arterial: 7.089 — CL (ref 7.35–7.45)
pO2, Arterial: 98 mmHg (ref 83–108)
pO2, Arterial: 69 mmHg — ABNORMAL LOW (ref 83–108)

## 2023-01-25 LAB — CBC WITH DIFFERENTIAL/PLATELET
Abs Immature Granulocytes: 0.14 10*3/uL — ABNORMAL HIGH (ref 0.00–0.07)
Basophils Absolute: 0 10*3/uL (ref 0.0–0.1)
Basophils Relative: 0 %
Eosinophils Absolute: 0.1 10*3/uL (ref 0.0–0.5)
Eosinophils Relative: 1 %
HCT: 40.5 % (ref 39.0–52.0)
Hemoglobin: 12.9 g/dL — ABNORMAL LOW (ref 13.0–17.0)
Immature Granulocytes: 2 %
Lymphocytes Relative: 5 %
Lymphs Abs: 0.4 10*3/uL — ABNORMAL LOW (ref 0.7–4.0)
MCH: 31.9 pg (ref 26.0–34.0)
MCHC: 31.9 g/dL (ref 30.0–36.0)
MCV: 100 fL (ref 80.0–100.0)
Monocytes Absolute: 0.7 10*3/uL (ref 0.1–1.0)
Monocytes Relative: 9 %
Neutro Abs: 6.9 10*3/uL (ref 1.7–7.7)
Neutrophils Relative %: 83 %
Platelets: 58 10*3/uL — ABNORMAL LOW (ref 150–400)
RBC: 4.05 MIL/uL — ABNORMAL LOW (ref 4.22–5.81)
RDW: 15.4 % (ref 11.5–15.5)
WBC: 8.2 10*3/uL (ref 4.0–10.5)
nRBC: 0 % (ref 0.0–0.2)

## 2023-01-25 LAB — GLUCOSE, CAPILLARY
Glucose-Capillary: 201 mg/dL — ABNORMAL HIGH (ref 70–99)
Glucose-Capillary: 248 mg/dL — ABNORMAL HIGH (ref 70–99)
Glucose-Capillary: 242 mg/dL — ABNORMAL HIGH (ref 70–99)
Glucose-Capillary: 280 mg/dL — ABNORMAL HIGH (ref 70–99)

## 2023-01-25 LAB — C-REACTIVE PROTEIN: CRP: 1 mg/dL — ABNORMAL HIGH (ref <1.0)

## 2023-01-25 LAB — ASPERGILLUS ANTIGEN, BAL/SERUM: Aspergillus Ag, BAL/Serum: 0.14 Index (ref 0.00–0.49)

## 2023-01-25 MED ORDER — FUROSEMIDE 10 MG/ML IJ SOLN
80.0000 mg | Freq: Once | INTRAMUSCULAR | Status: AC
  Administered 2023-01-25: 80 mg via INTRAVENOUS
  Filled 2023-01-25: qty 8

## 2023-01-25 MED ORDER — FENTANYL CITRATE PF 50 MCG/ML IJ SOSY: 25.0000 ug | PREFILLED_SYRINGE | Freq: Once | INTRAMUSCULAR | Status: DC

## 2023-01-25 MED ORDER — CALCITRIOL 0.25 MCG PO CAPS
0.2500 ug | ORAL_CAPSULE | Freq: Every day | ORAL | Status: DC
  Administered 2023-01-26: 0.25 ug
  Filled 2023-01-25: qty 1

## 2023-01-25 MED ORDER — FENTANYL CITRATE PF 50 MCG/ML IJ SOSY: 50.0000 ug | PREFILLED_SYRINGE | INTRAMUSCULAR | Status: DC | PRN

## 2023-01-25 MED ORDER — PRAVASTATIN SODIUM 10 MG PO TABS
10.0000 mg | ORAL_TABLET | Freq: Every day | ORAL | Status: DC
  Administered 2023-01-25 – 2023-01-26 (×2): 10 mg
  Filled 2023-01-25 (×3): qty 1

## 2023-01-25 MED ORDER — DEXMEDETOMIDINE HCL IN NACL 400 MCG/100ML IV SOLN: 0.0000 ug/kg/h | INTRAVENOUS | Status: DC

## 2023-01-25 MED ORDER — POLYETHYLENE GLYCOL 3350 17 G PO PACK
17.0000 g | PACK | Freq: Every day | ORAL | Status: DC
  Administered 2023-01-26: 17 g
  Filled 2023-01-25: qty 1

## 2023-01-25 MED ORDER — FOLIC ACID 1 MG PO TABS
2.0000 mg | ORAL_TABLET | Freq: Every day | ORAL | Status: DC
  Administered 2023-01-26: 2 mg
  Filled 2023-01-25: qty 2

## 2023-01-25 MED ORDER — EPINEPHRINE 1 MG/10ML IJ SOSY
PREFILLED_SYRINGE | INTRAMUSCULAR | Status: AC
  Filled 2023-01-25: qty 10

## 2023-01-25 MED ORDER — POLYETHYLENE GLYCOL 3350 17 G PO PACK: 17.0000 g | PACK | Freq: Every day | ORAL | Status: DC | PRN

## 2023-01-25 MED ORDER — APIXABAN 2.5 MG PO TABS
2.5000 mg | ORAL_TABLET | Freq: Two times a day (BID) | ORAL | Status: DC
  Administered 2023-01-25 – 2023-01-26 (×3): 2.5 mg
  Filled 2023-01-25 (×3): qty 1

## 2023-01-25 MED ORDER — SODIUM CHLORIDE 0.9 % IV SOLN
12.5000 mg | Freq: Once | INTRAVENOUS | Status: AC
  Administered 2023-01-25: 12.5 mg via INTRAVENOUS
  Filled 2023-01-25: qty 0.5

## 2023-01-25 MED ORDER — FENTANYL BOLUS VIA INFUSION: 25.0000 ug | INTRAVENOUS | Status: DC | PRN

## 2023-01-25 MED ORDER — NEPRO/CARBSTEADY PO LIQD: 237.0000 mL | Freq: Two times a day (BID) | ORAL | Status: DC

## 2023-01-25 MED ORDER — ONDANSETRON HCL 4 MG/2ML IJ SOLN
4.0000 mg | Freq: Four times a day (QID) | INTRAMUSCULAR | Status: DC | PRN
  Administered 2023-01-25: 4 mg via INTRAVENOUS
  Filled 2023-01-25: qty 2

## 2023-01-25 MED ORDER — SODIUM BICARBONATE 8.4 % IV SOLN
100.0000 meq | Freq: Once | INTRAVENOUS | Status: AC
  Administered 2023-01-25: 100 meq via INTRAVENOUS

## 2023-01-25 MED ORDER — ETOMIDATE 2 MG/ML IV SOLN
20.0000 mg | Freq: Once | INTRAVENOUS | Status: AC
  Administered 2023-01-25: 20 mg via INTRAVENOUS
  Filled 2023-01-25: qty 10

## 2023-01-25 MED ORDER — SODIUM CHLORIDE 0.9 % IV SOLN
250.0000 mL | INTRAVENOUS | Status: DC
  Administered 2023-01-25: 250 mL via INTRAVENOUS

## 2023-01-25 MED ORDER — PREDNISONE 20 MG PO TABS
10.0000 mg | ORAL_TABLET | Freq: Every day | ORAL | Status: DC
  Administered 2023-01-26: 10 mg
  Filled 2023-01-25: qty 1

## 2023-01-25 MED ORDER — ALLOPURINOL 100 MG PO TABS
100.0000 mg | ORAL_TABLET | Freq: Every day | ORAL | Status: DC
  Administered 2023-01-26: 100 mg
  Filled 2023-01-25: qty 1

## 2023-01-25 MED ORDER — ACETAMINOPHEN 325 MG PO TABS: 650.0000 mg | ORAL_TABLET | Freq: Four times a day (QID) | ORAL | Status: DC | PRN

## 2023-01-25 MED ORDER — SUCCINYLCHOLINE CHLORIDE 200 MG/10ML IV SOSY
80.0000 mg | PREFILLED_SYRINGE | Freq: Once | INTRAVENOUS | Status: AC
  Administered 2023-01-25: 80 mg via INTRAVENOUS

## 2023-01-25 MED ORDER — ACETAMINOPHEN 650 MG RE SUPP: 650.0000 mg | Freq: Four times a day (QID) | RECTAL | Status: DC | PRN

## 2023-01-25 MED ORDER — TACROLIMUS 1 MG PO CAPS
2.0000 mg | ORAL_CAPSULE | Freq: Two times a day (BID) | ORAL | Status: DC
  Administered 2023-01-26 – 2023-01-30 (×8): 2 mg via SUBLINGUAL
  Filled 2023-01-25 (×12): qty 2

## 2023-01-25 MED ORDER — FENTANYL CITRATE PF 50 MCG/ML IJ SOSY
100.0000 ug | PREFILLED_SYRINGE | Freq: Once | INTRAMUSCULAR | Status: DC
  Filled 2023-01-25: qty 2

## 2023-01-25 MED ORDER — SODIUM BICARBONATE 8.4 % IV SOLN
INTRAVENOUS | Status: AC
  Filled 2023-01-25: qty 100

## 2023-01-25 MED ORDER — ROCURONIUM BROMIDE 10 MG/ML (PF) SYRINGE
80.0000 mg | PREFILLED_SYRINGE | Freq: Once | INTRAVENOUS | Status: DC
  Filled 2023-01-25: qty 10

## 2023-01-25 MED ORDER — NOREPINEPHRINE 4 MG/250ML-% IV SOLN
2.0000 ug/min | INTRAVENOUS | Status: DC
  Administered 2023-01-25 – 2023-01-26 (×2): 7 ug/min via INTRAVENOUS
  Administered 2023-01-28: 4 ug/min via INTRAVENOUS
  Filled 2023-01-25 (×3): qty 250

## 2023-01-25 MED ORDER — MIDAZOLAM HCL 2 MG/2ML IJ SOLN
2.0000 mg | Freq: Once | INTRAMUSCULAR | Status: AC
  Administered 2023-01-25: 2 mg via INTRAVENOUS
  Filled 2023-01-25 (×2): qty 2

## 2023-01-25 MED ORDER — FENTANYL 2500MCG IN NS 250ML (10MCG/ML) PREMIX INFUSION
25.0000 ug/h | INTRAVENOUS | Status: DC
  Administered 2023-01-25: 25 ug/h via INTRAVENOUS
  Filled 2023-01-25: qty 250

## 2023-01-25 MED ORDER — METOPROLOL SUCCINATE ER 50 MG PO TB24
50.0000 mg | ORAL_TABLET | Freq: Every day | ORAL | Status: DC
  Administered 2023-01-25: 50 mg via ORAL
  Filled 2023-01-25: qty 1

## 2023-01-25 MED ORDER — LACTATED RINGERS IV BOLUS
500.0000 mL | Freq: Once | INTRAVENOUS | Status: AC
  Administered 2023-01-25: 500 mL via INTRAVENOUS

## 2023-01-25 MED ORDER — FAMOTIDINE 20 MG PO TABS: 20.0000 mg | ORAL_TABLET | Freq: Two times a day (BID) | ORAL | Status: DC

## 2023-01-25 MED ORDER — NOREPINEPHRINE 4 MG/250ML-% IV SOLN
INTRAVENOUS | Status: AC
  Filled 2023-01-25: qty 250

## 2023-01-25 MED ORDER — DOCUSATE SODIUM 50 MG/5ML PO LIQD
100.0000 mg | Freq: Two times a day (BID) | ORAL | Status: DC
  Administered 2023-01-25 – 2023-01-26 (×3): 100 mg
  Filled 2023-01-25 (×3): qty 10

## 2023-01-25 NOTE — Progress Notes (Signed)
[facility] PCCM  Called back to bedside Mental status has improved slightly but not to a significant degree, still not consistently protecting airway Suspect that he has lung injury from pneumonia and pulmonary edema Discussed goals of care and code status with his wife again, she is still interested in full code and using mechanical life support for several days if that's what it takes for him to live Will proceed with mechanical intubation, mechanical ventilation  Roselie Awkward, MD [place] PCCM Pager: [phone] Cell: [phone] After 7:00 pm call [place]  [phone]

## 2023-01-25 NOTE — Progress Notes (Signed)
Received call from patient's wife that patient wasn't waking up easily. Patient minimally responsive and will briefly open eyes. Patient with some periods of grogginess throughout the afternoon but was alert and conversational. O2 saturations dropped to around 78% on 13L HFNC, O2 increased to 15L and NRB added. Saturations increased to 96%. Rapid Response RN arrived to bedside and Dr. Sloan Leiter notified of status change. CXR and ABG pending.  Patient transferred to [facility] per order and report given at bedside to [place], [place].

## 2023-01-25 NOTE — Significant Event (Signed)
Rapid Response Event Note   Reason for Call :  Acute oxygen desaturation, decreased LOC Pt was on 13L HFNC, SpO2 down to 82%  Initial Focused Assessment:  Pt on 15L HFNC and 15L NRB upon my arrival. He is minimally responsive. He will briefly open his eyes. Lung sounds are clear. He is tachypneic, but breathing does not appear labored. Skin is moist, pale.   VS: T 56F, BP 137/66, HR 108, RR 17, SpO2 96% on 100% NRB and 15L HFNC  Interventions:  -ABG -CXR  Plan of Care:  -PCCM consulted -Transfer to ICU  Event Summary:  MD Notified: Dr. Raelyn Mora - Dr. Verlon Au at bedside Call Time: 1841 Arrival Time: 1845 End Time: 1922- Night shift RRT tending to patient while transfer is being coordinated  Casimer Bilis, RN

## 2023-01-25 NOTE — Progress Notes (Addendum)
eLink Physician-Brief Progress Note Patient Name: Russell Bowers DOB: 12-13-1951 MRN: [id number]   Date of Service  01/25/2023  HPI/Events of Note  Brief New admit note: Shifted to ICU from floor for worsening lethargy, on BiPAP for AHRF. Admitted on 23 rd for AHRF from CHF exacerbation, was on Vent, self extubated on 31 st, renal Transplant hx- worsening renal failure, got HD in ICU on 2/2, s/p Bronchoscopy .  Data: reviewed  Camera evaluation done: Tolerating BiPAP. Sats 100%. Sinus tachy 110. MAP ok  A/P: Encephalopathy from mostly phenergan use, OSA, type 2 resp failure worsening AHHRF-Covid PNA CHF/effusion ESRD, immunosuppressed. A fib-eliquis  - asp precautions - follow ABG - on antibitoics. - wean fio2 as tolerated - low thresh hold for intubation if worsening GCS.   VTE: as above. CBG goals < 180. on SSI.  Full code   [place]. Neomia Glass, MD, FCCP. [phone]   eICU Interventions       Intervention Category Major Interventions: Respiratory failure - evaluation and management Evaluation Type: New Patient Evaluation  Elmer Sow 01/25/2023, 7:52 PM  08:34  Camera: ABG on HFNC reviewed, Type 2 pco2 > 100, discussed with RT. Low Ve. Increased BiPAP 18/5/18/40%, to get ABG at 9 PM  CxR images seen, improving effusions. Ok to give scheduled lasix 80 mg, SBP 128. Sats 100%.  21:26 Camera: Discussed repeat ABG with RT. AHHRF, on BiPAP. not getting better. encephalopathy, pco2 > 100, tried going up on rate, IPAP-Ve < 7 lit only. told RT to get AVAP- machine from ED to try. looks like heading towards re intubation. - notified CCM ground team to assess at bed side for intubation.

## 2023-01-25 NOTE — Progress Notes (Signed)
[facility] PCCM  Discussed with nursing team.  Rocuronium ordered, but succinylcholine '80mg'$  IV administered.  Patient seen, currently doing well, neuromuscular blockade has worn off as expected for succinylcholine.    Continue current management with PAD protocol, fentanyl infusion Restraints ordered to prevent self extubation  Roselie Awkward, MD [place] [facility] Pager: [phone] Cell: [phone] After 7:00 pm call Elink  [phone]

## 2023-01-25 NOTE — Significant Event (Signed)
Patient lethargic, less responsive compared to prior per RN ~ 1840--Was on Bipap in ICU initially but for past 2 night heated high flow, then hi flow last pm Received phenergan Midday Decreasing responsiveness Wife states has had issues with CO2 build up in the past at [facility] when hopsitalized. Typically uses BIPAP at home  Oi/e BP (!) 143/77 (BP Location: Right Arm)   Pulse (!) 113   Temp 98 F (36.7 C) (Rectal)   Resp (!) 21   Ht '5\' 9"'$  (1.753 m)   Wt 82.5 kg   SpO2 (!) 88%   BMI 26.86 kg/m  Lethargic, pin-point pupil Gasping Resp Poor exam--? Rales to lungs S1 s2 cannot app m  P Likely will need intubation/CCM graciously taking over care  Dispo--per Dr. [place] Bells who arrived on unit  Verneita Griffes, MD [facility] 7:13 PM

## 2023-01-25 NOTE — Progress Notes (Signed)
PROGRESS NOTE    Russell Bowers  FUX:[id number] DOB: [birth year] DOA: 01/14/2023 PCP: [name], MD    Brief Narrative:  72 year old with multiple medical issues including diabetes, history of kidney transplant and now with CKD, obstructive sleep apnea who was admitted to the medical floor on 1/23 with acute hypoxemic and hypercapnic respiratory failure secondary to COVID-19 pneumonia or pneumonitis, UTI and A-fib. 1/30, he was more restless and agitated.  He was found to be hypercapnic, placed on BiPAP with no response so transferred to intensive care unit.  Patient was intubated in the intensive care unit. 1/31, self extubated overnight. 2/1, improved from respiratory standpoint and remains on high flow nasal cannula oxygen.  Kidney function continues to deteriorate.  Nephrology following. 2/2, transferred to medical floor.   Assessment & Plan:   Acute on chronic hypoxemic and hypercapnic respiratory failure: Fluid overload, unable to clear secretions and deconditioning.  Treated with broad-spectrum antibiotics ceftriaxone and azithromycin. as needed bronchodilators, deep breathing exercises, incentive spirometry, chest physiotherapy and PT OT. Continue antibiotics. Supplemental oxygen to keep saturations more than 90%. Today on 67 L of oxygen.   AKI on CKD 5 progressing to ESRD: Patient progressed to ESRD.  Started on hemodialysis.  Outpatient dialysis scheduling in progress MWF at [facility].  Probable discharge once oxygenation improves.  Followed by neurology.  Renal transplant: Failed renal transplant.  Remains on prednisone 10 mg daily, tacrolimus.  Essential hypertension: Blood pressure improving now.  COVID-19 pneumonia: Symptomatic treatment.  Breathing treatments and mobility. In-hospital isolation for 21 days due to severe pneumonia requiring intubation.  Chronic atrial fibrillation: His heart rate is elevated now.  Blood pressures are adequate.  Resume home dose  of metoprolol XL 50 mg daily starting now.  Patient on Eliquis and therapeutic.  Diabetes type 2: On glipizide and insulin at home.  Oral intake is improving. on long-acting insulin as well as sliding scale insulin.  Encourage oral intake.  Liberalized to regular diet to have more nutrition.   DVT prophylaxis: apixaban (ELIQUIS) tablet 2.5 mg Start: 01/22/23 1000 apixaban (ELIQUIS) tablet 2.5 mg   Code Status: Full code Family Communication: Wife at the bedside Disposition Plan: Status is: Inpatient Remains inpatient appropriate because: High flow oxygen, inpatient dialysis need     Consultants:  Nephrology Critical care  Procedures:  Hemodialysis  Antimicrobials:  Rocephin azithromycin   Subjective:  Patient seen and examined.  Wife at the bedside.  He is with clear mentation today.  Oxygenation has improved and he is down on 6 to 7 L of oxygen.  Some dry cough present.  Afebrile.  Blood pressures are adequate and elevated today.  Heart rate is 100-120 and A-fib.  Not received his metoprolol yet.  Tolerated hemodialysis.  Objective: Vitals:   01/25/23 0504 01/25/23 0600 01/25/23 0812 01/25/23 0843  BP: (!) 157/84  (!) 160/79   Pulse: (!) 106 (!) 108 (!) 116 96  Resp: 20 (!) 22 (!) 23 16  Temp: (!) 97.5 F (36.4 C)  97.8 F (36.6 C)   TempSrc: Oral  Oral   SpO2: 92% 93% 94% 95%  Weight: 82.5 kg     Height:        Intake/Output Summary (Last 24 hours) at 01/25/2023 1101 Last data filed at 01/25/2023 0506 Gross per 24 hour  Intake 700 ml  Output 2250 ml  Net -1550 ml   Filed Weights   01/24/23 0850 01/24/23 1242 01/25/23 0504  Weight: 83.6 kg 81.6 kg 82.5 kg  Examination:  General exam: Appears calm and comfortable today.  On 6 L of oxygen on my examination. Respiratory system: Poor bilateral air entry.  Not in any distress.  SpO2: 95 % O2 Flow Rate (L/min): 6 L/min FiO2 (%): 36 %  Cardiovascular system: S1 & S2 heard, RRR.  1+ bilateral pedal edema.    Gastrointestinal system: Abdomen is nondistended, soft and nontender. No organomegaly or masses felt. Normal bowel sounds heard. Central nervous system: Alert and oriented. No focal neurological deficits.  Generalized weakness. Extremities: Symmetric 5 x 5 power. Left upper extremity AV fistula,     Data Reviewed: I have personally reviewed following labs and imaging studies  CBC: Recent Labs  Lab 01/21/23 0145 01/21/23 1751 01/22/23 0627 01/23/23 0550 01/24/23 0919 01/24/23 0920 01/25/23 0055  WBC 6.9  --  7.0 7.8 8.1 8.4 8.2  NEUTROABS 5.5  --  5.7 6.4 6.7  --  6.9  HGB 13.4   < > 12.8* 12.0* 12.1* 12.1* 12.9*  HCT 44.3   < > 41.9 38.5* 37.3* 37.8* 40.5  MCV 104.7*  --  102.4* 100.3* 99.2 98.4 100.0  PLT 62*  --  51* 57*  56* 57* 58* 58*   < > = values in this interval not displayed.   Basic Metabolic Panel: Recent Labs  Lab 01/20/23 0040 01/21/23 0145 01/21/23 1751 01/22/23 0141 01/22/23 0627 01/22/23 1801 01/23/23 0550 01/24/23 0920  NA 133* 134* 132* 132* 133*  --  135 134*  K 3.5 4.1 3.8 3.7 4.0  --  3.4* 3.5  CL 96* 97*  --   --  93*  --  95* 96*  CO2 28 31  --   --  23  --  25 25  GLUCOSE 121* 89  --   --  165*  --  128* 290*  BUN 34* 62*  --   --  50*  --  37* 53*  CREATININE 3.03* 4.99*  --   --  4.59*  --  3.81* 4.98*  CALCIUM 8.2* 8.5*  --   --  8.7*  --  9.0 9.1  MG 1.8 2.0  --   --  2.0 1.9 2.0  --   PHOS 3.6 7.1*  --   --  5.0* 4.3 4.0 4.0   GFR: Estimated Creatinine Clearance: 13.6 mL/min (A) (by C-G formula based on SCr of 4.98 mg/dL (H)). Liver Function Tests: Recent Labs  Lab 01/19/23 0045 01/20/23 0040 01/21/23 0145 01/24/23 0920  AST 14* 29 18  --   ALT 14 37 26  --   ALKPHOS 45 55 47  --   BILITOT 0.5 0.8 0.8  --   PROT 5.7* 6.5 5.9*  --   ALBUMIN 3.8 4.2 3.7 3.7   No results for input(s): "LIPASE", "AMYLASE" in the last 168 hours. No results for input(s): "AMMONIA" in the last 168 hours. Coagulation Profile: Recent Labs   Lab 01/23/23 0550  INR 1.7*   Cardiac Enzymes: No results for input(s): "CKTOTAL", "CKMB", "CKMBINDEX", "TROPONINI" in the last 168 hours. BNP (last 3 results) No results for input(s): "PROBNP" in the last 8760 hours. HbA1C: No results for input(s): "HGBA1C" in the last 72 hours. CBG: Recent Labs  Lab 01/24/23 0740 01/24/23 1108 01/24/23 1752 01/24/23 2200 01/25/23 0616  GLUCAP 260* 175* 301* 355* 201*   Lipid Profile: No results for input(s): "CHOL", "HDL", "LDLCALC", "TRIG", "CHOLHDL", "LDLDIRECT" in the last 72 hours. Thyroid Function Tests: No results for input(s): "TSH", "T4TOTAL", "  FREET4", "T3FREE", "THYROIDAB" in the last 72 hours. Anemia Panel: No results for input(s): "VITAMINB12", "FOLATE", "FERRITIN", "TIBC", "IRON", "RETICCTPCT" in the last 72 hours. Sepsis Labs: No results for input(s): "PROCALCITON", "LATICACIDVEN" in the last 168 hours.  Recent Results (from the past 240 hour(s))  Culture, Respiratory w Gram Stain     Status: None   Collection Time: 01/21/23  4:43 PM   Specimen: Bronchoalveolar Lavage; Respiratory  Result Value Ref Range Status   Specimen Description BRONCHIAL ALVEOLAR LAVAGE  Final   Special Requests NONE  Final   Gram Stain NO WBC SEEN NO ORGANISMS SEEN   Final   Culture   Final    NO GROWTH 2 DAYS Performed at [facility], [address], [place], [place] [address]    Report Status 01/24/2023 FINAL  Final  Acid Fast Smear (AFB)     Status: None   Collection Time: 01/21/23  4:43 PM   Specimen: Bronchoalveolar Lavage; Respiratory  Result Value Ref Range Status   AFB Specimen Processing Concentration  Final   Acid Fast Smear Negative  Final    Comment: (NOTE) Performed At: [facility] [place], [place] [id number] Rush Farmer MD ID:[id number]    Source (AFB) BRONCHIAL ALVEOLAR LAVAGE  Final    Comment: Performed at [facility], [place] [address], [place], [place] [address]  Aspergillus Ag,  BAL/Serum     Status: None   Collection Time: 01/21/23  4:43 PM   Specimen: Bronchial Alveolar Lavage  Result Value Ref Range Status   Aspergillus Ag, BAL/Serum 0.14 0.00 - 0.49 Index Final    Comment: (NOTE) Performed At: [facility] [address] [place], [place] [id number] Rush Farmer MD VV:[id number]          Radiology Studies: No results found.      Scheduled Meds:  allopurinol  100 mg Oral Daily   apixaban  2.5 mg Oral BID   calcitRIOL  0.25 mcg Oral Daily   Chlorhexidine Gluconate Cloth  6 each Topical Q0600   docusate sodium  100 mg Oral BID   feeding supplement (NEPRO CARB STEADY)  237 mL Oral BID BM   folic acid  2 mg Oral Daily   insulin aspart  0-9 Units Subcutaneous TID WC   insulin glargine-yfgn  8 Units Subcutaneous QHS   melatonin  3 mg Oral QHS   metoprolol succinate  50 mg Oral Daily   multivitamin  1 tablet Per Tube QHS   mouth rinse  15 mL Mouth Rinse 4 times per day   pantoprazole (PROTONIX) IV  40 mg Intravenous QHS   pravastatin  10 mg Oral QHS   predniSONE  10 mg Oral Daily   sodium chloride flush  3 mL Intravenous Q12H   tacrolimus  2 mg Oral BID   Continuous Infusions:  sodium chloride Stopped (01/19/23 1701)   sodium chloride Stopped (01/22/23 1549)   azithromycin Stopped (01/24/23 1645)   cefTRIAXone (ROCEPHIN)  IV Stopped (01/24/23 1531)     LOS: 11 days    Time spent: 35 minutes    Barb Merino, MD [facility] Pager [phone]

## 2023-01-25 NOTE — Progress Notes (Signed)
[facility] Progress Note  Assessment/ Plan: AKI on CKD5 progressing to ESRD - b/l creat 4.1 from sept 2023, eGFR 15 ml/min. Creat here 5.2 on admission in the setting of 1 month coughing, poor po intake and newly + here for COVID infection. BP's were low, CT w/o edema, CXR w/o edema. UA +wbcs, renal US unremarkable. Mild pedal edema on initial exam. Viral pna is likely given hypoxia. AKI felt to be intravasc vol depletion vs vol overload vs progression of CKD.  Tried cautious IVF's w/ no improvement in B/Cr or UOP, then tried IV lasix + albumin also w/o good response. B/Cr continue to rise and pt more lethargic and confused c/w uremia. Had 1st HD 1/27, 2nd finished 0400 on 1/29, last HD was on 2/2 with 2L net UF.  MS markedly improved and not combative like the 1st HD treatment.  Appreciate renal navigator finding a MWF spot for him at [facility] [address] [facility]. Prefers [address] and renal navigator is aware but no spots available.   Suspect he has progressed to ESRD; discussed also with Dr. Carolin Sicks who has been following the pt as an outpt in clinic for many years. Spouse hopeful for recovery and I have explained that he is likely ESRD and probably won't come off dialysis.   Plan next HD for Monday.  Pulm edema - w/ bilat rales, CXR 1/27 sig worse --> cont to lower vol w/ HD Renal transplant - done in 2012, w/ function slowly failing. He was CKD stage 5 in sept 2023. Taking only prograf at '2mg'$  am and '2mg'$  pm (pm dose decr from 3) because of high level 9.1 (3.0-8.0).  BP - BP's normal to low, started midodrine 10 tid COVID infection - suspected covid pna, per pmd Atrial fib - seen by cards DM2 per pmd  Subjective: UOP low as expected with HD. HD #1 2.6 L, #2 2.5, short tx #3 on 1/30 bec of agitation. Full treatment 1/31 2.5L and 2/2 w/ 2L UF. Alert and cooperative, conversant. Afib RVR   Vitals:   01/25/23 0504 01/25/23 0600 01/25/23 0812 01/25/23 0843  BP: (!) 157/84  (!) 160/79    Pulse: (!) 106 (!) 108 (!) 116 96  Resp: 20 (!) 22 (!) 23 16  Temp: (!) 97.5 F (36.4 C)  97.8 F (36.6 C)   TempSrc: Oral  Oral   SpO2: 92% 93% 94% 95%  Weight: 82.5 kg     Height:        Exam: Gen alert, no distress +JVD to angle of jaw Chest dec'd L base, bilat rhonchi Irreg irreg tachy no MRG Abd soft ntnd no mass or ascites +bs Ext mild pretib/ pedal edema Neuro is alert, Ox 3 , nf, no asterixis  LUA AVF +thrill     Home meds include - allopurinol, sensipar 30 qd, rocaltrol 0.25 mcg qd, pepcid, lasix 40 qd, glipizide, insulin glargine, toprol xl 50, pravachol, prograf '2mg'$  am and '3mg'$  pm, prns/ vits/ supps       Date                          Creat               eGFR    2014                         2.1- 2.7    2015  1.5- 2.1    Feb- aug 2022         3.0- 3.5            18- 21    Sept 2022                 3.65                 17 ml/min    Sept 2023                 4.09                 15 ml/min    01/14/23                     5.37                     01/15/23                     5.27                 11 ml/min     CXR 1/23 - IMPRESSION: 1. Reticulonodular opacities bilaterally, which may reflect atypical/viral infection.   CR chest noncont - Lungs/Pleura: Central airways are patent. Bibasilar linear opacities similar to prior exam and likely due to scarring. Small bilateral pleural effusions    CXR 1/27 - marked worsening of bilat effusions and pulm edema    UCr 134, UNa < 10    UA - prot 100, many bact, 0-5 rbc, >50 wbc    Renal US transplant - normal [place], no hydro, nonspecific elevated intrarenal resistive indices.         Recent Labs  Lab 01/21/23 0145 01/21/23 1751 01/23/23 0550 01/24/23 0919 01/24/23 0920 01/25/23 0055  HGB 13.4   < > 12.0*   < > 12.1* 12.9*  ALBUMIN 3.7  --   --   --  3.7  --   CALCIUM 8.5*   < > 9.0  --  9.1  --   PHOS 7.1*   < > 4.0  --  4.0  --   CREATININE 4.99*   < > 3.81*  --  4.98*  --   K 4.1   < > 3.4*  --   3.5  --    < > = values in this interval not displayed.   No results for input(s): "IRON", "TIBC", "FERRITIN" in the last 168 hours.  Inpatient medications:  allopurinol  100 mg Oral Daily   apixaban  2.5 mg Oral BID   calcitRIOL  0.25 mcg Oral Daily   Chlorhexidine Gluconate Cloth  6 each Topical Q0600   docusate sodium  100 mg Oral BID   feeding supplement (NEPRO CARB STEADY)  237 mL Oral BID BM   folic acid  2 mg Oral Daily   insulin aspart  0-9 Units Subcutaneous TID WC   insulin glargine-yfgn  8 Units Subcutaneous QHS   melatonin  3 mg Oral QHS   metoprolol succinate  50 mg Oral Daily   multivitamin  1 tablet Per Tube QHS   mouth rinse  15 mL Mouth Rinse 4 times per day   pantoprazole (PROTONIX) IV  40 mg Intravenous QHS   pravastatin  10 mg Oral QHS   predniSONE  10 mg Oral Daily   sodium chloride flush  3 mL Intravenous Q12H   tacrolimus  2 mg Oral BID  sodium chloride Stopped (01/19/23 1701)   sodium chloride Stopped (01/22/23 1549)   azithromycin Stopped (01/24/23 1645)   cefTRIAXone (ROCEPHIN)  IV Stopped (01/24/23 1531)   sodium chloride, acetaminophen **OR** acetaminophen, metoprolol tartrate, ondansetron (ZOFRAN) IV, mouth rinse, polyethylene glycol, sodium chloride flush

## 2023-01-25 NOTE — Progress Notes (Signed)
NAME:  Russell Bowers, MRN:  [id number], DOB:  1951/04/21, LOS: 11 ADMISSION DATE:  01/14/2023, CONSULTATION DATE:  1/30 REFERRING MD:  Verlon Au, CHIEF COMPLAINT:  dyspnea, confusion   History of Present Illness:  72 year old male with a past medical history significant for renal transplant among multiple other problems was admitted on January 14, 2023 in the context of shortness of breath, hypoxemia from COVID-19. He was admitted by the hospitalist service, has been treated with Solu-Medrol, ceftriaxone and azithromycin. He has had progressive renal failure over the last several months and during this admission started on hemodialysis.  During this admission he's had up to 9 liters of fluid removed via hemodialysis. He was intubated on 1/30 for hypercarbic respiratory failure noted to have some mild bronchomalacia via bronchoscopy.  He self extubated 1/31 early AM, was stable on high flow nasal cannula, moved out of ICU on 2/1. 2/3 PCCM called back due to worsening lethargy. Wife reports that he was up all night 2/2>2/3 AM, was talkative morning of 2/3 but lethargic.  Got phenergan around noon and mental status did not improve.  PCCM consulted for evaluation.  Has not received other sedatives/narcotics today.  Last HD was on 2/2.  Per his wife he has not slept on BIPAP since leaving the ICU.  He uses that therapy at home.   Pertinent  Medical History   Past Medical History:  Diagnosis Date   ADHD (attention deficit hyperactivity disorder)    Allergic rhinitis    Cancer ([place])    Prostate cancer   Cellulitis and abscess of trunk 2014   history of back abscess   Cellulitis and abscess of unspecified site 2014   lower back   Detached retina    Diabetes mellitus without complication (HCC)    Elevated troponin    Fistula    OF LEFT ARM   Gout    H/O kidney transplant    History of congestive heart failure    FOLLOWED BY DR. 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 Melvyn Novas   Hypertension    Mild concentric left ventricular hypertrophy (LVH)    AND AORTIC STENOSIS FOLLOWED IN THE PAST BY CARDIOLOGIST DR. Marlou Porch.   OSA treated with BiPAP    Personal history of colonic polyps    Prostate cancer ([place]) 11/2010   PROSTATECTOMY WITH UROLOGIST DR. DAVIS   Renal failure    Shortness of breath    Sleep disorder breathing    FOLLOWED BY PULMONOLOGIST DR. [place] [facility] Events: Including procedures, antibiotic start and stop dates in addition to other pertinent events   1/30 ABG showing hypercapnic RF. BIPAP placed without significant improvement. PCCM consulted for intubation. 1/31 Self-extubated overnight.  2/1 Improved from a respiratory standpoint, HHFNC 55L/FiO2 75%. 2/2 HD again 2/3 progressive lethargy  Interim History / Subjective:  As above  Objective   Blood pressure 137/66, pulse (!) 108, temperature 98 F (36.7 C), temperature source Rectal, resp. rate 17, height '5\' 9"'$  (1.753 m), weight 82.5 kg, SpO2 96 %.    FiO2 (%):  [36 %] 36 %   Intake/Output Summary (Last 24 hours) at 01/25/2023 1923 Last data filed at 01/25/2023 1456 Gross per 24 hour  Intake 450 ml  Output 250 ml  Net 200 ml   Filed Weights   01/24/23 0850 01/24/23 1242 01/25/23 0504  Weight:  83.6 kg 81.6 kg 82.5 kg    Examination:  General:  Lethargic in bed HENT: NCAT OP clear PULM: Diminished bases, normal effort CV: RRR, no mgr GI: BS+, soft, nontender MSK: normal bulk and tone Neuro: drowsy, follows commands (weakly gives thumbs up), generally   2/3 CXR images personally reviewed> bibasilar air space disease and pleural effusions  Resolved Hospital Problem list     Assessment & Plan:  Acute on chronic respiratory failure with hypercarbia Tracheobronchomalacia COVID 19 and bacterial pneumonia Pulmonay edema, acute bilateral  pleural effusions OSA on BIPAP qHS Admit to ICU ABG NIMV Close monitoring, may need intubation Ceftriaxone and azithro to continue  Acute metabolic encephalopathy due to hypercarbia complicated by  phenergan No sedation Frequent orientation melatonin  ESRD Failed renal transplant Immunocompromised Prednisone, tacrolimus  Hyperlipidemia Prvastatin  Gout Allopurinol  A fib Metoprolol Eliquis Tele  DM2 with poorly controlled hyperglycemia [place] Ssi Check accucheck on arrival to ICU     Best Practice (right click and "Reselect all SmartList Selections" daily)   Diet/type: NPO DVT prophylaxis: DOAC GI prophylaxis: N/A Lines: N/A Foley:  N/A Code Status:  full code Last date of multidisciplinary goals of care discussion [1/30 with wife]  Critical care time: 40 minutes    Roselie Awkward, MD [place] [facility] Pager: [phone] Cell: [phone] After 7:00 pm call Elink  [phone]

## 2023-01-25 NOTE — Progress Notes (Signed)
An USGPIV (ultrasound guided PIV) has been placed for short-term vasopressor infusion. A correctly placed ivWatch must be used when administering Vasopressors. Should this treatment be needed beyond 72 hours, central line access should be obtained.  It will be the responsibility of the bedside nurse to follow best practice to prevent extravasations.   

## 2023-01-25 NOTE — Progress Notes (Signed)
Inpatient Rehab Admissions Coordinator:  ? ?Per therapy recommendations,  patient was screened for CIR candidacy by Ranon Coven, MS, CCC-SLP. At this time, Pt. Appears to be a a potential candidate for CIR. I will place   order for rehab consult per protocol for full assessment. Please contact me any with questions. ? ?Tylik Treese, MS, CCC-SLP ?Rehab Admissions Coordinator  ?[phone] (celll) ?[phone] (office) ? ?

## 2023-01-25 NOTE — Procedures (Signed)
Intubation Procedure Note  BRYSYN BRANDENBERGER  [id number]  1951-06-20  Date:01/25/23  Time:10:25 PM   Provider Performing:Brent Alveena Taira    Procedure: Intubation (31500)  Indication(s) Respiratory Failure  Consent Risks of the procedure as well as the alternatives and risks of each were explained to the patient and/or caregiver.  Consent for the procedure was obtained and is signed in the bedside chart   Anesthesia Etomidate and Rocuronium   Time Out Verified patient identification, verified procedure, site/side was marked, verified correct patient position, special equipment/implants available, medications/allergies/relevant history reviewed, required imaging and test results available.   Sterile Technique Usual hand hygeine, masks, and gloves were used   Procedure Description Patient positioned in bed supine.  Sedation given as noted above.  Patient was intubated with endotracheal tube using Glidescope.  View was Grade 1 full glottis .  Number of attempts was 1.  Colorimetric CO2 detector was consistent with tracheal placement.   Complications/Tolerance None; patient tolerated the procedure well. Chest X-ray is ordered to verify placement.   EBL Minimal   Specimen(s) None  Roselie Awkward, MD [place] [facility] Pager: [phone] Cell: [phone] After 7:00 pm call Elink  [phone]

## 2023-01-25 NOTE — Progress Notes (Signed)
Mobility Specialist Progress Note:   01/25/23 1138  Mobility  Activity Contraindicated/medical hold   Pt inappropriate for mobility specialist at this time given HR 140 at rest and tachy. Mobility specialist to hold today, will continue to follow for readiness.   Andrey Campanile Mobility Specialist Please contact via SecureChat or  Rehab office at [phone]

## 2023-01-25 NOTE — Progress Notes (Signed)
Pt transported on 15L HFNC and NRB to 2H14 with 3E RN, SWOT RN, and RRT.

## 2023-01-26 DIAGNOSIS — J9601 Acute respiratory failure with hypoxia: Secondary | ICD-10-CM | POA: Diagnosis not present

## 2023-01-26 DIAGNOSIS — J9602 Acute respiratory failure with hypercapnia: Secondary | ICD-10-CM | POA: Diagnosis not present

## 2023-01-26 LAB — CBC WITH DIFFERENTIAL/PLATELET
Abs Immature Granulocytes: 0.49 10*3/uL — ABNORMAL HIGH (ref 0.00–0.07)
Basophils Absolute: 0.1 10*3/uL (ref 0.0–0.1)
Basophils Relative: 1 %
Eosinophils Absolute: 0 10*3/uL (ref 0.0–0.5)
Eosinophils Relative: 0 %
HCT: 40.7 % (ref 39.0–52.0)
Hemoglobin: 12.9 g/dL — ABNORMAL LOW (ref 13.0–17.0)
Immature Granulocytes: 5 %
Lymphocytes Relative: 3 %
Lymphs Abs: 0.3 10*3/uL — ABNORMAL LOW (ref 0.7–4.0)
MCH: 31.8 pg (ref 26.0–34.0)
MCHC: 31.7 g/dL (ref 30.0–36.0)
MCV: 100.2 fL — ABNORMAL HIGH (ref 80.0–100.0)
Monocytes Absolute: 0.8 10*3/uL (ref 0.1–1.0)
Monocytes Relative: 8 %
Neutro Abs: 8.4 10*3/uL — ABNORMAL HIGH (ref 1.7–7.7)
Neutrophils Relative %: 83 %
Platelets: 83 10*3/uL — ABNORMAL LOW (ref 150–400)
RBC: 4.06 MIL/uL — ABNORMAL LOW (ref 4.22–5.81)
RDW: 15.2 % (ref 11.5–15.5)
WBC: 10.1 10*3/uL (ref 4.0–10.5)
nRBC: 0 % (ref 0.0–0.2)

## 2023-01-26 LAB — POCT I-STAT 7, (LYTES, BLD GAS, ICA,H+H)
Acid-Base Excess: 2 mmol/L (ref 0.0–2.0)
Bicarbonate: 25.7 mmol/L (ref 20.0–28.0)
Calcium, Ion: 1.13 mmol/L — ABNORMAL LOW (ref 1.15–1.40)
HCT: 40 % (ref 39.0–52.0)
Hemoglobin: 13.6 g/dL (ref 13.0–17.0)
O2 Saturation: 98 %
Patient temperature: 97.2
Potassium: 4.6 mmol/L (ref 3.5–5.1)
Sodium: 135 mmol/L (ref 135–145)
TCO2: 27 mmol/L (ref 22–32)
pCO2 arterial: 34.6 mmHg (ref 32–48)
pH, Arterial: 7.476 — ABNORMAL HIGH (ref 7.35–7.45)
pO2, Arterial: 101 mmHg (ref 83–108)

## 2023-01-26 LAB — BASIC METABOLIC PANEL
Anion gap: 15 (ref 5–15)
BUN: 36 mg/dL — ABNORMAL HIGH (ref 8–23)
CO2: 23 mmol/L (ref 22–32)
Calcium: 8.6 mg/dL — ABNORMAL LOW (ref 8.9–10.3)
Chloride: 96 mmol/L — ABNORMAL LOW (ref 98–111)
Creatinine, Ser: 3.79 mg/dL — ABNORMAL HIGH (ref 0.61–1.24)
GFR, Estimated: 16 mL/min — ABNORMAL LOW (ref >60)
Glucose, Bld: 225 mg/dL — ABNORMAL HIGH (ref 70–99)
Potassium: 3.9 mmol/L (ref 3.5–5.1)
Sodium: 134 mmol/L — ABNORMAL LOW (ref 135–145)

## 2023-01-26 LAB — RESP PANEL BY RT-PCR (RSV, FLU A&B, COVID)  RVPGX2
Influenza A by PCR: NEGATIVE
Influenza B by PCR: NEGATIVE
Resp Syncytial Virus by PCR: NEGATIVE
SARS Coronavirus 2 by RT PCR: NEGATIVE

## 2023-01-26 LAB — GLUCOSE, CAPILLARY
Glucose-Capillary: 138 mg/dL — ABNORMAL HIGH (ref 70–99)
Glucose-Capillary: 141 mg/dL — ABNORMAL HIGH (ref 70–99)
Glucose-Capillary: 204 mg/dL — ABNORMAL HIGH (ref 70–99)
Glucose-Capillary: 241 mg/dL — ABNORMAL HIGH (ref 70–99)

## 2023-01-26 LAB — C-REACTIVE PROTEIN: CRP: 1 mg/dL — ABNORMAL HIGH (ref <1.0)

## 2023-01-26 MED ORDER — ORAL CARE MOUTH RINSE
15.0000 mL | OROMUCOSAL | Status: DC
  Administered 2023-01-26 (×5): 15 mL via OROMUCOSAL

## 2023-01-26 MED ORDER — MIDODRINE HCL 5 MG PO TABS
5.0000 mg | ORAL_TABLET | Freq: Three times a day (TID) | ORAL | Status: DC
  Administered 2023-01-26 – 2023-01-27 (×2): 5 mg
  Filled 2023-01-26 (×4): qty 1

## 2023-01-26 MED ORDER — QUETIAPINE FUMARATE 25 MG PO TABS
25.0000 mg | ORAL_TABLET | Freq: Every day | ORAL | Status: DC
  Administered 2023-01-26 – 2023-01-29 (×4): 25 mg via ORAL
  Filled 2023-01-26 (×4): qty 1

## 2023-01-26 MED ORDER — ORAL CARE MOUTH RINSE: 15.0000 mL | OROMUCOSAL | Status: DC | PRN

## 2023-01-26 NOTE — Progress Notes (Signed)
RT placed pt back in full ventilatory support due to ventilator alarm "no patient effort". Pt placed back on previous settings. RN notified.

## 2023-01-26 NOTE — Progress Notes (Signed)
Received patient in bed to unit.  Alert and oriented.  Informed consent signed and in chart.   [place] duration:3.5 hours Started:0223 Ended: [id number]  Patient tolerated well.  Transported back to the room  Alert, without acute distress.  Hand-off given to patient's nurse.   Access used: AVF Access issues: NONE  Total UF removed: 2500 Medication(s) given: NONE Post HD VS: SEE TABLE BELOW Post HD weight: Unable to weight patient.   01/26/23 0613  Vitals  BP 122/77  MAP (mmHg) 90  BP Location Right Arm  BP Method Automatic  Patient Position (if appropriate) Lying  Pulse Rate 99  Pulse Rate Source Monitor  ECG Heart Rate (!) 101  Resp 20  Oxygen Therapy  SpO2 100 %  O2 Device Ventilator  Patient Activity (if Appropriate) In bed  Pulse Oximetry Type Continuous  During Treatment Monitoring  HD Safety Checks Performed Yes  Intra-Hemodialysis Comments Tolerated well  Post Treatment  Dialyzer Clearance Lightly streaked  Duration of HD Treatment -hour(s) 3.5 hour(s)  Hemodialysis Intake (mL) 0 mL  Liters Processed 84  Fluid Removed (mL) 2500 mL  Tolerated HD Treatment Yes  Post-Hemodialysis Comments 8  AVG/AVF Arterial Site Held (minutes) 8 minutes  Vascular Access Left Forearm Arteriovenous fistula  No Placement Date or Time found.   Orientation: Left  Access Location: Forearm  Access Type: Arteriovenous fistula  Site Condition No complications  Fistula / Graft Assessment Present;Thrill;Bruit  Status Deaccessed;[facility]

## 2023-01-26 NOTE — Procedures (Signed)
Extubation Procedure Note  Patient Details:   Name: Russell Bowers DOB: [birth year] MRN: [id number]   Airway Documentation:    Vent end date: 01/26/23 Vent end time: 1053   Evaluation  O2 sats: stable throughout Complications: No apparent complications Patient did tolerate procedure well. Bilateral Breath Sounds: Clear, Diminished   Yes,  Per CCM order, RT extubated pt to 4L [place]. Prior to extubation, pt did have a positive cuff leak. Pt tolerated well with SVS, no complications, no stridor noted and pt was able to state his name. RN currently at bedside. RT will continue to monitor pt.  Jorje Guild 01/26/2023, 10:55 AM

## 2023-01-26 NOTE — Progress Notes (Signed)
Pt placed on BiPAP w/last sleep study settings of 13/9 via full facemask and pt tol well. FiO2 initially set @ 28% w/sats of 99%.

## 2023-01-26 NOTE — Progress Notes (Signed)
RT placed pt in CPAP/PS 12/5, pt tolerated well at this time with SVS. RN notified.

## 2023-01-26 NOTE — Progress Notes (Signed)
[facility] Progress Note  Assessment/ Plan: AKI on CKD5 progressing to ESRD - b/l creat 4.1 from sept 2023, eGFR 15 ml/min. Creat here 5.2 on admission in the setting of 1 month coughing, poor po intake and newly + here for COVID infection. BP's were low, CT w/o edema, CXR w/o edema. UA +wbcs, renal US unremarkable. Mild pedal edema on initial exam. Viral pna is likely given hypoxia. AKI felt to be intravasc vol depletion vs vol overload vs progression of CKD.  Tried cautious IVF's w/ no improvement in B/Cr or UOP, then tried IV lasix + albumin also w/o good response. B/Cr continue to rise and pt more lethargic and confused c/w uremia. Had 1st HD 1/27, 2nd finished 0400 on 1/29, last HD was on 2/2 with 2L net UF.  MS markedly improved and not combative like the 1st HD treatment; he looked very good Fri during HD treatment and surprised with the decompensation last night. Tolerated additional HD treatment overnight which finished very early this am (2.5L UF).  Will plan on HD tomorrow as well.  Appreciate renal navigator finding a MWF spot for him at [address] [facility]. Prefers [address] and renal navigator is aware but no spots available.   Suspect he has progressed to ESRD; discussed also with Dr. Carolin Sicks who has been following the pt as an outpt in clinic for many years. Spouse hopeful for recovery and I have explained that he is likely ESRD and probably won't come off dialysis.   Plan next HD for Monday.  Pulm edema - w/ bilat rales, CXR 1/27 sig worse --> cont to lower vol w/ HD Renal transplant - done in 2012, w/ function slowly failing. He was CKD stage 5 in sept 2023. Taking only prograf at '2mg'$  am and '2mg'$  pm (pm dose decr from 3) because of high level 9.1 (3.0-8.0). And likely can start to wean off as outpt slowly especially if he's not a future candidate for another transplant. BP - BP's normal to low, started midodrine 10 tid COVID infection - suspected covid pna, per  pmd Atrial fib - seen by cards DM2 per pmd  Subjective: UOP low as expected with HD. HD #1 2.6 L, #2 2.5, short tx #3 on 1/30 bec of agitation. Full treatment 1/31 2.5L and 2/2 w/ 2L UF. Alert and cooperative, conversant. Had to be intubated last night with AMS and for airway protection.     Vitals:   01/26/23 1015 01/26/23 1030 01/26/23 1045 01/26/23 1100  BP: 126/66 130/69 (!) 111/56 129/74  Pulse: (!) 110 (!) 107 (!) 109 (!) 117  Resp: '17 19 15 '$ (!) 23  Temp:      TempSrc:      SpO2: 93% 94% 95% 97%  Weight:      Height:        Exam: Gen alert, no distress +JVD to angle of jaw Chest dec'd L base, bilat rhonchi Irreg irreg tachy no MRG Abd soft ntnd no mass or ascites +bs Ext mild pretib/ pedal edema Neuro is alert, Ox 3 , nf, no asterixis  LUA AVF +thrill     Home meds include - allopurinol, sensipar 30 qd, rocaltrol 0.25 mcg qd, pepcid, lasix 40 qd, glipizide, insulin glargine, toprol xl 50, pravachol, prograf '2mg'$  am and '3mg'$  pm, prns/ vits/ supps       Date  Creat               eGFR    2014                         2.1- 2.7    2015                         1.5- 2.1    Feb- aug 2022         3.0- 3.5            18- 21    Sept 2022                 3.65                 17 ml/min    Sept 2023                 4.09                 15 ml/min    01/14/23                     5.37                     01/15/23                     5.27                 11 ml/min     CXR 1/23 - IMPRESSION: 1. Reticulonodular opacities bilaterally, which may reflect atypical/viral infection.   CR chest noncont - Lungs/Pleura: Central airways are patent. Bibasilar linear opacities similar to prior exam and likely due to scarring. Small bilateral pleural effusions    CXR 1/27 - marked worsening of bilat effusions and pulm edema    UCr 134, UNa < 10    UA - prot 100, many bact, 0-5 rbc, >50 wbc    Renal US transplant - normal [place], no hydro, nonspecific elevated intrarenal resistive  indices.         Recent Labs  Lab 01/21/23 0145 01/21/23 1751 01/23/23 0550 01/24/23 0919 01/24/23 0920 01/25/23 0055 01/26/23 0010 01/26/23 0241  HGB 13.4   < > 12.0*   < > 12.1*   < > 13.6 12.9*  ALBUMIN 3.7  --   --   --  3.7  --   --   --   CALCIUM 8.5*   < > 9.0  --  9.1  --   --  8.6*  PHOS 7.1*   < > 4.0  --  4.0  --   --   --   CREATININE 4.99*   < > 3.81*  --  4.98*  --   --  3.79*  K 4.1   < > 3.4*  --  3.5   < > 4.6 3.9   < > = values in this interval not displayed.   No results for input(s): "IRON", "TIBC", "FERRITIN" in the last 168 hours.  Inpatient medications:  allopurinol  100 mg Per Tube Daily   apixaban  2.5 mg Per Tube BID   calcitRIOL  0.25 mcg Per Tube Daily   Chlorhexidine Gluconate Cloth  6 each Topical Q0600   docusate  100 mg Per Tube BID   feeding supplement (NEPRO CARB STEADY)  237 mL Per Tube BID BM  folic acid  2 mg Per Tube Daily   insulin aspart  0-9 Units Subcutaneous TID WC   insulin glargine-yfgn  8 Units Subcutaneous QHS   multivitamin  1 tablet Per Tube QHS   mouth rinse  15 mL Mouth Rinse 4 times per day   pantoprazole (PROTONIX) IV  40 mg Intravenous QHS   polyethylene glycol  17 g Per Tube Daily   pravastatin  10 mg Per Tube QHS   predniSONE  10 mg Per Tube Daily   sodium chloride flush  3 mL Intravenous Q12H   tacrolimus  2 mg Sublingual BID    sodium chloride Stopped (01/19/23 1701)   sodium chloride Stopped (01/22/23 1549)   sodium chloride 10 mL/hr at 01/26/23 1000   azithromycin Stopped (01/25/23 1656)   cefTRIAXone (ROCEPHIN)  IV Stopped (01/25/23 1526)   norepinephrine (LEVOPHED) Adult infusion 3 mcg/min (01/26/23 1100)   sodium chloride, acetaminophen **OR** acetaminophen, ondansetron (ZOFRAN) IV, mouth rinse, mouth rinse, polyethylene glycol, sodium chloride flush

## 2023-01-26 NOTE — Progress Notes (Signed)
NAME:  Russell Bowers, MRN:  [id number], DOB:  05/31/1951, LOS: 12 ADMISSION DATE:  01/14/2023, CONSULTATION DATE:  1/30 REFERRING MD:  Verlon Au, CHIEF COMPLAINT:  dyspnea, confusion   History of Present Illness:  72 year old male with a past medical history significant for renal transplant among multiple other problems was admitted on January 14, 2023 in the context of shortness of breath, hypoxemia from COVID-19. He was admitted by the hospitalist service, has been treated with Solu-Medrol, ceftriaxone and azithromycin. He has had progressive renal failure over the last several months and during this admission started on hemodialysis.  During this admission he's had up to 9 liters of fluid removed via hemodialysis. He was intubated on 1/30 for hypercarbic respiratory failure noted to have some mild bronchomalacia via bronchoscopy.  He self extubated 1/31 early AM, was stable on high flow nasal cannula, moved out of ICU on 2/1. 2/3 PCCM called back due to worsening lethargy. Wife reports that he was up all night 2/2>2/3 AM, was talkative morning of 2/3 but lethargic.  Got phenergan around noon and mental status did not improve.  PCCM consulted for evaluation.  Has not received other sedatives/narcotics today.  Last HD was on 2/2.  Per his wife he has not slept on BIPAP since leaving the ICU.  He uses that therapy at home.   Pertinent  Medical History   Past Medical History:  Diagnosis Date   ADHD (attention deficit hyperactivity disorder)    Allergic rhinitis    Cancer ([place])    Prostate cancer   Cellulitis and abscess of trunk 2014   history of back abscess   Cellulitis and abscess of unspecified site 2014   lower back   Detached retina    Diabetes mellitus without complication (HCC)    Elevated troponin    Fistula    OF LEFT ARM   Gout    H/O kidney transplant    History of congestive heart failure    FOLLOWED BY DR. 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 Melvyn Novas   Hypertension    Mild concentric left ventricular hypertrophy (LVH)    AND AORTIC STENOSIS FOLLOWED IN THE PAST BY CARDIOLOGIST DR. Marlou Porch.   OSA treated with BiPAP    Personal history of colonic polyps    Prostate cancer ([place]) 11/2010   PROSTATECTOMY WITH UROLOGIST DR. DAVIS   Renal failure    Shortness of breath    Sleep disorder breathing    FOLLOWED BY PULMONOLOGIST DR. [place] Hospital Events: Including procedures, antibiotic start and stop dates in addition to other pertinent events   1/30 ABG showing hypercapnic RF. BIPAP placed without significant improvement. PCCM consulted for intubation. 1/31 Self-extubated overnight.  2/1 Improved from a respiratory standpoint, HHFNC 55L/FiO2 75%. 2/2 HD again 2/3 progressive lethargy  Interim History / Subjective:  Got HD now awake writing notes, wants tube out.   On 7 mcg/min of levo but coming down  Objective   Blood pressure (!) 154/86, pulse (!) 101, temperature 98.6 F (37 C), temperature source Axillary, resp. rate 20, height '5\' 9"'$  (1.753 m), weight 83 kg, SpO2 98 %.    Vent Mode: PRVC FiO2 (%):  [35 %-100 %] 35 % Set Rate:  [20 bmp-24 bmp] 20 bmp Vt Set:  [560 mL] 560 mL PEEP:  [5 cmH20] 5 cmH20 Pressure Support:  [  [place] Pressure:  [20 cmH20] 20 cmH20   Intake/Output Summary (Last 24 hours) at 01/26/2023 0926 Last data filed at 01/26/2023 8657 Gross per 24 hour  Intake 1346.43 ml  Output 2500 ml  Net -1153.57 ml    Filed Weights   01/25/23 0504 01/26/23 0630  Weight: 82.5 kg 83 kg    Examination: Awake no distress following commands Mild crackles on lung auscultation Ext warm no edema   2/3 CXR images personally reviewed> bibasilar air space disease and pleural effusions  Resolved Hospital Problem list     Assessment & Plan:  Acute on chronic  respiratory failure with hypercarbia- exacerbated by sedating medications, not wearing BIPAP; has proven he needs nocturnal ventilation assistance from here on out. Questionable CAP vs. COVID- x-ray improved rapidly with fluid removal.  I do not think he has COVID pneumonia.  COVID + 01/14/23.   Tracheobronchomalacia- complicating recovery ESRD Failed renal transplant Immunocompromised DM2 with hyperglycemia Afib HLD Gout Sedation related shock  - Recheck COVID, if cycle time over 30 I am going to DC airborne - Basal bolus insulin - Wean levophed to off - iHD per nephro - Will add phenergan to his allergy list - Wean to extubate - Need to find good insomnia med for him - BIPAP qHS mandatory - Continue home antirejection meds - PT/OT etc  Best Practice (right click and "Reselect all SmartList Selections" daily)   Diet/type: progress as tolerated DVT prophylaxis: DOAC GI prophylaxis: N/A Lines: N/A Foley:  N/A Code Status:  full code Last date of multidisciplinary goals of care discussion [called wife no answer]  Critical care time: 51 minutes    Erskine Emery MD [place]

## 2023-01-26 NOTE — Progress Notes (Signed)
NUTRITION NOTE RD working remotely.  Consult received for TF initiation and management. Patient was transferred from [place] to [place] yesterday on 15L HFNC and NRB after a Rapid Response event. He was subsequently intubated late last night and OGT placed; no imaging to confirm OGT placement.  Patient was extubated today at ~1053.   He was previously on a Regular diet with 1.2 L fluid restriction and then Renal/Carb Modified with 1.2 L fluid restriction. Patient was eating 50% at meals on average though documentation is limited.   Will monitor for diet re-advancement vs need for NGT placement and initiation of TF.    Jarome Matin, MS, RD, LDN, CNSC Clinical Dietitian PRN/Relief staff On-call/weekend pager # available in [facility]

## 2023-01-27 DIAGNOSIS — R579 Shock, unspecified: Secondary | ICD-10-CM

## 2023-01-27 DIAGNOSIS — J9602 Acute respiratory failure with hypercapnia: Secondary | ICD-10-CM | POA: Diagnosis not present

## 2023-01-27 DIAGNOSIS — J9601 Acute respiratory failure with hypoxia: Secondary | ICD-10-CM | POA: Diagnosis not present

## 2023-01-27 LAB — RENAL FUNCTION PANEL
Albumin: 3.6 g/dL (ref 3.5–5.0)
Anion gap: 15 (ref 5–15)
BUN: 45 mg/dL — ABNORMAL HIGH (ref 8–23)
CO2: 26 mmol/L (ref 22–32)
Calcium: 9.1 mg/dL (ref 8.9–10.3)
Chloride: 91 mmol/L — ABNORMAL LOW (ref 98–111)
Creatinine, Ser: 4.7 mg/dL — ABNORMAL HIGH (ref 0.61–1.24)
GFR, Estimated: 13 mL/min — ABNORMAL LOW (ref >60)
Glucose, Bld: 332 mg/dL — ABNORMAL HIGH (ref 70–99)
Phosphorus: 4.9 mg/dL — ABNORMAL HIGH (ref 2.5–4.6)
Potassium: 3.9 mmol/L (ref 3.5–5.1)
Sodium: 132 mmol/L — ABNORMAL LOW (ref 135–145)

## 2023-01-27 LAB — CBC WITH DIFFERENTIAL/PLATELET
Abs Immature Granulocytes: 0.18 10*3/uL — ABNORMAL HIGH (ref 0.00–0.07)
Basophils Absolute: 0 10*3/uL (ref 0.0–0.1)
Basophils Relative: 0 %
Eosinophils Absolute: 0.1 10*3/uL (ref 0.0–0.5)
Eosinophils Relative: 1 %
HCT: 39.8 % (ref 39.0–52.0)
Hemoglobin: 12.1 g/dL — ABNORMAL LOW (ref 13.0–17.0)
Immature Granulocytes: 2 %
Lymphocytes Relative: 6 %
Lymphs Abs: 0.6 10*3/uL — ABNORMAL LOW (ref 0.7–4.0)
MCH: 32 pg (ref 26.0–34.0)
MCHC: 30.4 g/dL (ref 30.0–36.0)
MCV: 105.3 fL — ABNORMAL HIGH (ref 80.0–100.0)
Monocytes Absolute: 1.1 10*3/uL — ABNORMAL HIGH (ref 0.1–1.0)
Monocytes Relative: 11 %
Neutro Abs: 7.8 10*3/uL — ABNORMAL HIGH (ref 1.7–7.7)
Neutrophils Relative %: 80 %
Platelets: 64 10*3/uL — ABNORMAL LOW (ref 150–400)
RBC: 3.78 MIL/uL — ABNORMAL LOW (ref 4.22–5.81)
RDW: 15.4 % (ref 11.5–15.5)
WBC: 9.8 10*3/uL (ref 4.0–10.5)
nRBC: 0 % (ref 0.0–0.2)

## 2023-01-27 LAB — POCT I-STAT 7, (LYTES, BLD GAS, ICA,H+H)
Acid-Base Excess: 1 mmol/L (ref 0.0–2.0)
Bicarbonate: 27.5 mmol/L (ref 20.0–28.0)
Calcium, Ion: 1.26 mmol/L (ref 1.15–1.40)
HCT: 39 % (ref 39.0–52.0)
Hemoglobin: 13.3 g/dL (ref 13.0–17.0)
O2 Saturation: 91 %
Patient temperature: 98.6
Potassium: 3.9 mmol/L (ref 3.5–5.1)
Sodium: 133 mmol/L — ABNORMAL LOW (ref 135–145)
TCO2: 29 mmol/L (ref 22–32)
pCO2 arterial: 53.3 mmHg — ABNORMAL HIGH (ref 32–48)
pH, Arterial: 7.321 — ABNORMAL LOW (ref 7.35–7.45)
pO2, Arterial: 67 mmHg — ABNORMAL LOW (ref 83–108)

## 2023-01-27 LAB — GLUCOSE, CAPILLARY
Glucose-Capillary: 255 mg/dL — ABNORMAL HIGH (ref 70–99)
Glucose-Capillary: 307 mg/dL — ABNORMAL HIGH (ref 70–99)
Glucose-Capillary: 100 mg/dL — ABNORMAL HIGH (ref 70–99)
Glucose-Capillary: 190 mg/dL — ABNORMAL HIGH (ref 70–99)

## 2023-01-27 LAB — C-REACTIVE PROTEIN: CRP: 1.9 mg/dL — ABNORMAL HIGH (ref <1.0)

## 2023-01-27 MED ORDER — INSULIN ASPART 100 UNIT/ML IJ SOLN
0.0000 [IU] | Freq: Three times a day (TID) | INTRAMUSCULAR | Status: DC
  Administered 2023-01-27: 11 [IU] via SUBCUTANEOUS
  Administered 2023-01-28: 3 [IU] via SUBCUTANEOUS
  Administered 2023-01-28 (×2): 5 [IU] via SUBCUTANEOUS
  Administered 2023-01-29 – 2023-01-30 (×2): 3 [IU] via SUBCUTANEOUS
  Administered 2023-01-30: 2 [IU] via SUBCUTANEOUS

## 2023-01-27 MED ORDER — PRAVASTATIN SODIUM 10 MG PO TABS
10.0000 mg | ORAL_TABLET | Freq: Every day | ORAL | Status: DC
  Administered 2023-01-27 – 2023-01-29 (×3): 10 mg via ORAL
  Filled 2023-01-27 (×4): qty 1

## 2023-01-27 MED ORDER — POLYETHYLENE GLYCOL 3350 17 G PO PACK: 17.0000 g | PACK | Freq: Every day | ORAL | Status: DC

## 2023-01-27 MED ORDER — INSULIN ASPART 100 UNIT/ML IJ SOLN
0.0000 [IU] | Freq: Every day | INTRAMUSCULAR | Status: DC
  Administered 2023-01-29: 3 [IU] via SUBCUTANEOUS

## 2023-01-27 MED ORDER — CALCITRIOL 0.25 MCG PO CAPS
0.2500 ug | ORAL_CAPSULE | Freq: Every day | ORAL | Status: DC
  Administered 2023-01-28 – 2023-01-30 (×3): 0.25 ug via ORAL
  Filled 2023-01-27 (×3): qty 1

## 2023-01-27 MED ORDER — INSULIN GLARGINE-YFGN 100 UNIT/ML ~~LOC~~ SOLN
8.0000 [IU] | Freq: Two times a day (BID) | SUBCUTANEOUS | Status: DC
  Administered 2023-01-27 (×2): 8 [IU] via SUBCUTANEOUS
  Filled 2023-01-27 (×4): qty 0.08

## 2023-01-27 MED ORDER — ALLOPURINOL 100 MG PO TABS
100.0000 mg | ORAL_TABLET | Freq: Every day | ORAL | Status: DC
  Administered 2023-01-28 – 2023-01-30 (×3): 100 mg via ORAL
  Filled 2023-01-27 (×3): qty 1

## 2023-01-27 MED ORDER — FOLIC ACID 1 MG PO TABS
2.0000 mg | ORAL_TABLET | Freq: Every day | ORAL | Status: DC
  Administered 2023-01-28 – 2023-01-30 (×3): 2 mg via ORAL
  Filled 2023-01-27 (×3): qty 2

## 2023-01-27 MED ORDER — MIDODRINE HCL 5 MG PO TABS
10.0000 mg | ORAL_TABLET | Freq: Three times a day (TID) | ORAL | Status: DC
  Administered 2023-01-27 – 2023-01-30 (×10): 10 mg via ORAL
  Filled 2023-01-27 (×9): qty 2

## 2023-01-27 MED ORDER — RENA-VITE PO TABS
1.0000 | ORAL_TABLET | Freq: Every day | ORAL | Status: DC
  Administered 2023-01-27 – 2023-01-29 (×3): 1 via ORAL
  Filled 2023-01-27 (×3): qty 1

## 2023-01-27 MED ORDER — INSULIN ASPART 100 UNIT/ML IJ SOLN
3.0000 [IU] | Freq: Three times a day (TID) | INTRAMUSCULAR | Status: DC
  Administered 2023-01-27 – 2023-01-28 (×2): 3 [IU] via SUBCUTANEOUS

## 2023-01-27 MED ORDER — NEPRO/CARBSTEADY PO LIQD
237.0000 mL | Freq: Two times a day (BID) | ORAL | Status: DC
  Administered 2023-01-27 – 2023-01-30 (×5): 237 mL via ORAL
  Filled 2023-01-27: qty 237

## 2023-01-27 MED ORDER — APIXABAN 2.5 MG PO TABS
2.5000 mg | ORAL_TABLET | Freq: Two times a day (BID) | ORAL | Status: DC
  Administered 2023-01-27 – 2023-01-30 (×6): 2.5 mg via ORAL
  Filled 2023-01-27 (×6): qty 1

## 2023-01-27 MED ORDER — PREDNISONE 20 MG PO TABS
10.0000 mg | ORAL_TABLET | Freq: Every day | ORAL | Status: DC
  Administered 2023-01-28: 10 mg via ORAL
  Filled 2023-01-27: qty 1

## 2023-01-27 NOTE — Progress Notes (Signed)
Pt removed from bipap at this time.  Placed on 2lpm [place] with sat of 92%  RT will cont to monitor.

## 2023-01-27 NOTE — Progress Notes (Signed)
[facility] Progress Note  Assessment/ Plan: AKI on CKD5 progressing to ESRD - b/l creat 4.1 from sept 2023, eGFR 15 ml/min. Creat here 5.2 on admission in the setting of 1 month coughing, poor po intake and newly + here for COVID infection. BP's were low, CT w/o edema, CXR w/o edema. UA +wbcs, renal US unremarkable. Mild pedal edema on initial exam. Viral pna is likely given hypoxia. AKI felt to be intravasc vol depletion vs vol overload vs progression of CKD.  Tried cautious IVF's w/ no improvement in B/Cr or UOP, then tried IV lasix + albumin also w/o good response. B/Cr continue to rise and pt more lethargic and confused c/w uremia. Had 1st HD 1/27 and has required since.  Had extra tx yesterday for volume and MS yesterday - doing better today.   Will plan on HD today as well.  Appreciate renal navigator finding a MWF spot for him at [address] [facility]. Prefers [address] and renal navigator is aware but no spots available.   Suspect he has progressed to ESRD; Dr. Carolin Sicks who has been following the pt as an outpt in clinic for many years agreed as well.   Pulm edema - improving - cont to lower vol w/ HD Renal transplant - done in 2012, w/ function slowly failing. He was CKD stage 5 in sept 2023. Taking only prograf at '2mg'$  am and '2mg'$  pm (pm dose decr from 3) because of high level 9.1 (3.0-8.0). And likely can start to wean off as outpt slowly especially if he's not a future candidate for another transplant. BP - BP's normal to low, started midodrine 10 tid COVID infection - suspected covid pna, per pmd Atrial fib - seen by cards DM2 per pmd  Subjective: no new complaints - sitting up in chair getting ready to eat breakfast.  Tolerated UF 2.5L with HD yesterday.     Vitals:   01/27/23 0730 01/27/23 0745 01/27/23 0800 01/27/23 0815  BP: (!) 108/57 106/61 113/67 (!) 112/54  Pulse: 84 91 93 93  Resp: '16 16 16 16  '$ Temp:      TempSrc:      SpO2: 95% 95% 95% 95%  Weight:       Height:        Exam: Gen alert, no distress Chest dec'd L base, bilat rhonchi Irreg irreg irreg, HR 90s no MRG Abd soft ntnd no mass or ascites +bs Ext mild pretib/ pedal edema Neuro is alert, Ox 3 , nf, no asterixis  LUA AVF +thrill     Home meds include - allopurinol, sensipar 30 qd, rocaltrol 0.25 mcg qd, pepcid, lasix 40 qd, glipizide, insulin glargine, toprol xl 50, pravachol, prograf '2mg'$  am and '3mg'$  pm, prns/ vits/ supps       Date                          Creat               eGFR    2014                         2.1- 2.7    2015                         1.5- 2.1    Feb- aug 2022         3.0- 3.5  18- 21    Sept 2022                 3.65                 17 ml/min    Sept 2023                 4.09                 15 ml/min    01/14/23                     5.37                     01/15/23                     5.27                 11 ml/min     CXR 1/23 - IMPRESSION: 1. Reticulonodular opacities bilaterally, which may reflect atypical/viral infection.   CR chest noncont - Lungs/Pleura: Central airways are patent. Bibasilar linear opacities similar to prior exam and likely due to scarring. Small bilateral pleural effusions    CXR 1/27 - marked worsening of bilat effusions and pulm edema    UCr 134, UNa < 10    UA - prot 100, many bact, 0-5 rbc, >50 wbc    Renal US transplant - normal [place], no hydro, nonspecific elevated intrarenal resistive indices.         Recent Labs  Lab 01/21/23 0145 01/21/23 1751 01/23/23 0550 01/24/23 0919 01/24/23 0920 01/25/23 0055 01/26/23 0241 01/27/23 0543 01/27/23 0838  HGB 13.4   < > 12.0*   < > 12.1*   < > 12.9* 12.1* 13.3  ALBUMIN 3.7  --   --   --  3.7  --   --   --   --   CALCIUM 8.5*   < > 9.0  --  9.1  --  8.6*  --   --   PHOS 7.1*   < > 4.0  --  4.0  --   --   --   --   CREATININE 4.99*   < > 3.81*  --  4.98*  --  3.79*  --   --   K 4.1   < > 3.4*  --  3.5   < > 3.9  --  3.9   < > = values in this interval not displayed.     No results for input(s): "IRON", "TIBC", "FERRITIN" in the last 168 hours.  Inpatient medications:  allopurinol  100 mg Per Tube Daily   apixaban  2.5 mg Per Tube BID   calcitRIOL  0.25 mcg Per Tube Daily   Chlorhexidine Gluconate Cloth  6 each Topical Q0600   docusate  100 mg Per Tube BID   feeding supplement (NEPRO CARB STEADY)  237 mL Per Tube BID BM   folic acid  2 mg Per Tube Daily   insulin aspart  0-9 Units Subcutaneous TID WC   insulin glargine-yfgn  8 Units Subcutaneous QHS   midodrine  5 mg Per Tube TID WC   multivitamin  1 tablet Per Tube QHS   mouth rinse  15 mL Mouth Rinse 4 times per day   pantoprazole (PROTONIX) IV  40 mg Intravenous QHS   polyethylene glycol  17 g Per Tube Daily   pravastatin  10 mg Per Tube QHS   predniSONE  10 mg Per Tube Daily   QUEtiapine  25 mg Oral QHS   sodium chloride flush  3 mL Intravenous Q12H   tacrolimus  2 mg Sublingual BID    sodium chloride Stopped (01/19/23 1701)   sodium chloride Stopped (01/22/23 1549)   sodium chloride Stopped (01/26/23 1113)   azithromycin Stopped (01/26/23 1751)   cefTRIAXone (ROCEPHIN)  IV Stopped (01/26/23 1615)   norepinephrine (LEVOPHED) Adult infusion 2 mcg/min (01/27/23 0700)   sodium chloride, acetaminophen **OR** acetaminophen, ondansetron (ZOFRAN) IV, mouth rinse, mouth rinse, polyethylene glycol, sodium chloride flush

## 2023-01-27 NOTE — Progress Notes (Signed)
NAME:  Russell Bowers, MRN:  [id number], DOB:  May 19, 1951, LOS: 21 ADMISSION DATE:  01/14/2023, CONSULTATION DATE:  1/30 REFERRING MD:  Verlon Au, CHIEF COMPLAINT:  dyspnea, confusion   History of Present Illness:  72 year old male with a past medical history significant for renal transplant among multiple other problems was admitted on January 14, 2023 in the context of shortness of breath, hypoxemia from COVID-19. He was admitted by the hospitalist service, has been treated with Solu-Medrol, ceftriaxone and azithromycin. He has had progressive renal failure over the last several months and during this admission started on hemodialysis.  During this admission he's had up to 9 liters of fluid removed via hemodialysis. He was intubated on 1/30 for hypercarbic respiratory failure noted to have some mild bronchomalacia via bronchoscopy.  He self extubated 1/31 early AM, was stable on high flow nasal cannula, moved out of ICU on 2/1. 2/3 PCCM called back due to worsening lethargy. Wife reports that he was up all night 2/2>2/3 AM, was talkative morning of 2/3 but lethargic.  Got phenergan around noon and mental status did not improve.  PCCM consulted for evaluation.  Has not received other sedatives/narcotics today.  Last HD was on 2/2.  Per his wife he has not slept on BIPAP since leaving the ICU.  He uses that therapy at home.   Pertinent  Medical History   Past Medical History:  Diagnosis Date   ADHD (attention deficit hyperactivity disorder)    Allergic rhinitis    Cancer ([place])    Prostate cancer   Cellulitis and abscess of trunk 2014   history of back abscess   Cellulitis and abscess of unspecified site 2014   lower back   Detached retina    Diabetes mellitus without complication (HCC)    Elevated troponin    Fistula    OF LEFT ARM   Gout    H/O kidney transplant    History of congestive heart failure    FOLLOWED BY DR. 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 Melvyn Novas   Hypertension    Mild concentric left ventricular hypertrophy (LVH)    AND AORTIC STENOSIS FOLLOWED IN THE PAST BY CARDIOLOGIST DR. Marlou Porch.   OSA treated with BiPAP    Personal history of colonic polyps    Prostate cancer ([place]) 11/2010   PROSTATECTOMY WITH UROLOGIST DR. DAVIS   Renal failure    Shortness of breath    Sleep disorder breathing    FOLLOWED BY PULMONOLOGIST DR. Ronan Hospital Events: Including procedures, antibiotic start and stop dates in addition to other pertinent events   1/30 ABG showing hypercapnic RF. BIPAP placed without significant improvement. PCCM consulted for intubation. 1/31 Self-extubated overnight.  2/1 Improved from a respiratory standpoint, HHFNC 55L/FiO2 75%. 2/2 HD again 2/3 progressive lethargy, intubated overnight 2/4 HD, then extubated  Interim History / Subjective:  Denies complaints. Remains on NE.   Objective   Blood pressure 138/69, pulse (!) 111, temperature 98.1 F (36.7 C), temperature source Oral, resp. rate 15, height '5\' 9"'$  (1.753 m), weight 83 kg, SpO2 96 %.    FiO2 (%):  [24 %-28 %] 24 % Set Rate:  [16 bmp] 16 bmp PEEP:  [9 cmH20] 9 cmH20   Intake/Output Summary (Last 24 hours) at 01/27/2023 1213 Last data filed at 01/27/2023 1200 Gross per 24 hour  Intake  483.88 ml  Output 100 ml  Net 383.88 ml    Filed Weights   01/25/23 0504 01/26/23 0630  Weight: 82.5 kg 83 kg    Examination: Elderly man sitting up in the chair in NAD Bode/AT, eyes anicteric Basilar rhales, no wheezing or rhonchi. No tachypea or accessory muscle use. S1S2, tachycardic, irreg rhythm, RSB murmur 2/6 Abd nondistended Mild ankle edema, no cyanosis Skin warm, dry  Na+ 132 BUN 45 Cr 4.7 Cultures reviewed> no growth so far, aspergillus antigen WNL  Echo 1/24: LVEF 60-65%, moderately reduced RV function, dilated LA.  IVC normal size, reduced variability. Mild to moderate AS.   Resolved Hospital Problem list     Assessment & Plan:  Acute on chronic respiratory failure with hypercarbia- exacerbated by sedating medications, not wearing BIPAP; has proven he needs nocturnal ventilation assistance forthcoming. Tracheobronchomalacia- complicating recovery -BiPAP QHS  Shock, ongoing despite being off sedation -wean off NE -midodrine TID -may need to consider VQ scan   Chronic RV failure-- dates back to 2022 on echo -may need further workup of this as an outpatient. Suspect this is due to left heart disease.    Questionable CAP vs. COVID- x-ray improved rapidly with fluid removal.  I do not think he has COVID pneumonia.  COVID + 01/14/23.   -5 days of azithromycin -7 days of ceftriaxone  ESRD, s/p failed renal transplant hyponatremia Immunocompromised -con't tacrolimus, prednisone -regular electrolytes with HD -Strict I/O -renally dose meds, avoid nephrotoxins  DM2 with uncontrolled hyperglycemia -SSI PRN- increase scale -insulin glargine 8 units BID -start insulin aspart 3 units TIDAC -goal BG 140-180  Afib, newly diagnosed -new start on Eliquis -not rate controlled currently- likely 2/2 NE. Can resume PTA metoprolol once off pressors.   HLD -statin  Gout -allopurinol  Deconditioning -PT, OT  Best Practice (right click and "Reselect all SmartList Selections" daily)   Diet/type: progress as tolerated DVT prophylaxis: DOAC GI prophylaxis: N/A Lines: N/A Foley:  N/A Code Status:  full code Last date of multidisciplinary goals of care discussion [called wife no answer]  Critical care time:     This patient is critically ill with multiple organ system failure which requires frequent high complexity decision making, assessment, support, evaluation, and titration of therapies. This was completed through the application of advanced monitoring technologies and extensive interpretation  of multiple databases. During this encounter critical care time was devoted to patient care services described in this note for 38 minutes.  Julian Hy, DO 01/27/23 12:52 PM [facility]  For contact information, see Amion. If no response to pager, please call PCCM consult pager. After hours, 7PM- 7AM, please call Elink.

## 2023-01-27 NOTE — Progress Notes (Signed)
Spoke to [facility] this am to advise clinic that pt's d/c date is unknown and will provide updates as needed. Will assist with out-pt HD needs at d/c.   Melven Sartorius Renal Navigator [phone]

## 2023-01-27 NOTE — Progress Notes (Signed)
Physical Therapy Treatment Patient Details Name: Russell Bowers MRN: [id number] DOB: 03-04-1951 Today's Date: 01/27/2023   History of Present Illness Pt is a 72 y/o M presenting to ED on 1/23 with cough and SOB with exertion, found to be hypoxic at 78% on RA, COVID positive. Admitted for acute respiratory failure with hypoxia and hypercapnia and AKI. HD initiated 01/18/23. Intubated 1/30 and transferred to ICU, self-extubated 1/31. intubated 2/3-2/4 due to respiratory distress. PMH includes chronic diastolic heart failure, DM2, ESRD with renal transplant on tacrolimus, aortic stenosis, OSA On BiPAP.    PT Comments    Patient progressing well towards PT goals. Session focused on progressive ambulation and functional mobility. Requires Min A for standing and Min guard-Min A for gait training with use of RW for support as well as chair follow. Sp02 dropped to 86% on 2L/min 02 [place] and HR up to 120s bpm. Education on cues for pursed lip breathing which helped recover 02 within 20 seconds to >90%. Wife present and supportive during session. Encouraged walking to bathroom with nursing as needed. Wife reports concerns over confusion- will need further assessment. Will continue to follow.   Recommendations for follow up therapy are one component of a multi-disciplinary discharge planning process, led by the attending physician.  Recommendations may be updated based on patient status, additional functional criteria and insurance authorization.  Follow Up Recommendations  Acute inpatient rehab (3hours/day)     Assistance Recommended at Discharge Frequent or constant Supervision/Assistance  Patient can return home with the following Assistance with cooking/housework;Assist for transportation;Help with stairs or ramp for entrance;A lot of help with walking and/or transfers;A lot of help with bathing/dressing/bathroom;Direct supervision/assist for financial management;Direct supervision/assist for medications  management   Equipment Recommendations  Rolling walker (2 wheels)    Recommendations for Other Services       Precautions / Restrictions Precautions Precautions: Fall;Other (comment) Precaution Comments: watch SpO2 & HR, covid+ Restrictions Weight Bearing Restrictions: No     Mobility  Bed Mobility               General bed mobility comments: Up in chair upon PT arrival.    Transfers Overall transfer level: Needs assistance Equipment used: Rolling walker (2 wheels) Transfers: Sit to/from Stand Sit to Stand: Min assist           General transfer comment: Min A to power to standing with cues for hand placement/technique.    Ambulation/Gait Ambulation/Gait assistance: Min assist, Min guard Gait Distance (Feet): 190 Feet Assistive device: Rolling walker (2 wheels) Gait Pattern/deviations: Step-through pattern, Decreased stride length, Trunk flexed Gait velocity: reduced Gait velocity interpretation: 1.31 - 2.62 ft/sec, indicative of limited community ambulator   General Gait Details: Slow, mildly unsteady gait at times with chair follow, no evidence of knee buckling. 2 standing rest breaks. Sp02 dropped to 86% on 2L/min 02 [place], cues for pursed lip breathing provided, HR up to 120s bpm   Stairs             Wheelchair Mobility    Modified Rankin (Stroke Patients Only)       Balance   Sitting-balance support: Feet supported, No upper extremity supported Sitting balance-Leahy Scale: Fair     Standing balance support: During functional activity, Bilateral upper extremity supported Standing balance-Leahy Scale: Poor Standing balance comment: Reliant on RW                            Cognition  Arousal/Alertness: Awake/alert Behavior During Therapy: WFL for tasks assessed/performed Overall Cognitive Status: Impaired/Different from baseline Area of Impairment: Attention, Memory, Following commands, Awareness, Problem solving                    Current Attention Level: Sustained Memory: Decreased short-term memory Following Commands: Follows one step commands consistently, Follows one step commands with increased time   Awareness: Emergent Problem Solving: Slow processing, Requires verbal cues General Comments: Pt needing increased time to process simple cues.Per wife, pt has been waxing and waning with cognition. Provided some education on ICU delirium.        Exercises      General Comments General comments (skin integrity, edema, etc.): Sp02 dropped to 86% on 2L/min 02 [place], cues for pursed lip breathing provided, HR up to 120s bpm. Wife present and supportive during session.      Pertinent Vitals/Pain Pain Assessment Pain Assessment: No/denies pain    Home Living                          Prior Function            PT Goals (current goals can now be found in the care plan section) Progress towards PT goals: Progressing toward goals    Frequency    Min 3X/week      PT Plan Current plan remains appropriate    Co-evaluation              AM-PAC PT "6 Clicks" Mobility   Outcome Measure  Help needed turning from your back to your side while in a flat bed without using bedrails?: A Little Help needed moving from lying on your back to sitting on the side of a flat bed without using bedrails?: A Little Help needed moving to and from a bed to a chair (including a wheelchair)?: A Little Help needed standing up from a chair using your arms (e.g., wheelchair or bedside chair)?: A Little Help needed to walk in hospital room?: A Little Help needed climbing 3-5 steps with a railing? : A Lot 6 Click Score: 17    End of Session Equipment Utilized During Treatment: Gait belt;Oxygen Activity Tolerance: Patient tolerated treatment well;Treatment limited secondary to medical complications (Comment) (drop in 02) Patient left: in chair;with call bell/phone within reach;with chair alarm set;with  family/visitor present Nurse Communication: Mobility status PT Visit Diagnosis: Unsteadiness on feet (R26.81);Difficulty in walking, not elsewhere classified (R26.2);Other abnormalities of gait and mobility (R26.89);Muscle weakness (generalized) (M62.81)     Time: 7510-2585 PT Time Calculation (min) (ACUTE ONLY): 20 min  Charges:  $Gait Training: 8-22 mins                     Marisa Severin, PT, DPT Acute Rehabilitation Services Secure chat preferred Office [place] 01/27/2023, 12:50 PM

## 2023-01-27 NOTE — Progress Notes (Signed)
Inpatient Rehab Coordinator Note:  I met with patient and wife at bedside to discuss CIR recommendations and goals/expectations of CIR stay.  We reviewed 3 hrs/day of therapy, physician follow up, and average length of stay 2 weeks (dependent upon progress) with goals of mod I. Wife reports she is with patient 24/7. Patient would prefer to go home at discharge. They would like to take a day or so to discuss and see how he progresses. Will have my colleague follow back up tomorrow.    Rehab Admissons Coordinator [place], [place], [place] [phone]

## 2023-01-27 NOTE — Progress Notes (Signed)
POST HD TX NOTE   01/27/23 1906  Vitals  Temp 98.1 F (36.7 C)  Temp Source Oral  BP (!) 116/54  MAP (mmHg) 75  BP Location Right Arm  BP Method Automatic  Patient Position (if appropriate) Lying  Pulse Rate (!) 117  Pulse Rate Source Monitor  ECG Heart Rate (!) 111  Resp (!) 25  Oxygen Therapy  SpO2 100 %  O2 Device Nasal Cannula  O2 Flow Rate (L/min) 2 L/min  Pulse Oximetry Type Continuous  During Treatment Monitoring  Dialysis Fluid Bolus (S)   (post HD tx VS check)  Post Treatment  Dialyzer Clearance Lightly streaked  Duration of HD Treatment -hour(s) 4 hour(s)  Hemodialysis Intake (mL) 390 mL (abx and NS flushes)  Liters Processed 96  Fluid Removed (mL) 3110 mL (3110 (3500 machine value - 314m abx volume + 428mNS flushes = 3110)  Tolerated HD Treatment Yes  Post-Hemodialysis Comments tx completed w/o problem, UF goal met but pt was on levo throughout tx, blood rinsed back. Meds admin: Midodrine '10mg'$  po, Zithromax '500mg'$  IVPG, and Rocephin 2g IVPB  AVG/AVF Arterial Site Held (minutes) 8 minutes  AVG/AVF Venous Site Held (minutes) 8 minutes  Vascular Access Left Forearm Arteriovenous fistula  No Placement Date or Time found.   Orientation: Left  Access Location: Forearm  Access Type: Arteriovenous fistula  Site Condition No complications  Fistula / Graft Assessment Bruit;Thrill;Present  Drainage Description None

## 2023-01-27 NOTE — Progress Notes (Signed)
OT Cancellation Note  Patient Details Name: Russell Bowers MRN: [id number] DOB: 02-Feb-1951   Cancelled Treatment:    Reason Eval/Treat Not Completed: Patient at procedure or test/ unavailable.  In room HD on M, W and Fri.  OT to continue efforts next date.    Krishav Mamone D Lyon Dumont 01/27/2023, 2:27 PM 01/27/2023  RP, OTR/L  Acute Rehabilitation Services  Office:  [phone]

## 2023-01-27 NOTE — PMR Pre-admission (Shared)
PMR Admission Coordinator Pre-Admission Assessment  Patient: Russell Bowers is an 72 y.o., male MRN: [id number] DOB: 02-25-1951 Height: '5\' 9"'$  (175.3 cm) Weight: 82.2 kg  Insurance Information HMO: ***    PPO: ***     PCP: ***     IPA: ***     80/20: ***     OTHER: *** PRIMARY: [employer] medicare  Policy#: [id number]      Subscriber: Patient CM Name: ***      Phone#: ***     Fax#: *** Pre-Cert#: ***      Employer: *** Benefits:  Phone #: ***     Name: *** Irene Shipper. Date: ***     Deduct: ***      Out of Pocket Max: ***      Life Max: *** CIR: ***      SNF: *** Outpatient: ***     Co-Pay: *** Home Health: ***      Co-Pay: *** DME: ***     Co-Pay: *** Providers: *** SECONDARY: ***      Policy#: ***     Phone#: ***  Financial Counselor: ***      Phone#: ***  The "Data Collection Information Summary" for patients in Inpatient Rehabilitation Facilities with attached "Privacy Act [facility] Records" was provided and verbally reviewed with: Patient and Family  Emergency Contact Information Contact Information     Name Relation Home Work [place] Spouse [phone]   Ivo, Moga [phone]     Susann Givens Daughter [phone]         Current Medical History  Patient Admitting Diagnosis: Acute respiratory failure secondary to Covid 19 History of Present Illness: 72 year old male with a past medical history significant for renal transplant among multiple other problems was admitted to [facility] on January 14, 2023 in the context of shortness of breath, hypoxemia from COVID-19. He was admitted by the hospitalist service, has been treated with Solu-Medrol, ceftriaxone and azithromycin. He has had progressive renal failure over the last several months and during this admission started on hemodialysis. During this admission he's had up to 9 liters of fluid removed via hemodialysis. He was intubated on 1/30 for hypercarbic  respiratory failure noted to have some mild bronchomalacia via bronchoscopy.  He self extubated 1/31 early AM, was stable on high flow nasal cannula, moved out of ICU on 2/1. 2/3 PCCM called back due to worsening lethargy. Wife reports that he was up all night 2/2>2/3 AM, was talkative morning of 2/3 but lethargic. Uses Bipap at night. Therapies are recommending intensive rehab program.     [facility]'s [facility] record from [facility] has been reviewed by the rehabilitation admission coordinator and physician.  Past Medical History  Past Medical History:  Diagnosis Date   ADHD (attention deficit hyperactivity disorder)    Allergic rhinitis    Cancer ([place])    Prostate cancer   Cellulitis and abscess of trunk 2014   history of back abscess   Cellulitis and abscess of unspecified site 2014   lower back   Detached retina    Diabetes mellitus without complication (HCC)    Elevated troponin    Fistula    OF LEFT ARM   Gout    H/O kidney transplant    History of congestive heart failure    FOLLOWED BY DR. 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  WERT   Hypertension    Mild concentric left ventricular hypertrophy (LVH)    AND AORTIC STENOSIS FOLLOWED IN THE PAST BY CARDIOLOGIST DR. Marlou Porch.   OSA treated with BiPAP    Personal history of colonic polyps    Prostate cancer ([place]) 11/2010   PROSTATECTOMY WITH UROLOGIST DR. DAVIS   Renal failure    Shortness of breath    Sleep disorder breathing    FOLLOWED BY PULMONOLOGIST DR. Elsworth Soho    Has the patient had major surgery during 100 days prior to admission? No  Family History   family history includes Alzheimer's disease in his father; Atrial fibrillation in his mother; Diabetes in his mother; Heart Problems in his sister; Heart disease in his maternal grandfather and maternal grandmother; Hypertension in his  mother.  Current Medications  Current Facility-Administered Medications:    0.9 %  sodium chloride infusion, 250 mL, Intravenous, PRN, Thurnell Lose, MD, Stopped at 01/19/23 1701   0.9 %  sodium chloride infusion, 250 mL, Intravenous, Continuous, McQuaid, Douglas B, MD, Paused at 01/22/23 1549   0.9 %  sodium chloride infusion, 250 mL, Intravenous, Continuous, Bowser, Laurel Dimmer, NP, Stopped at 01/26/23 1113   acetaminophen (TYLENOL) tablet 650 mg, 650 mg, Per Tube, Q6H PRN **OR** acetaminophen (TYLENOL) suppository 650 mg, 650 mg, Rectal, Q6H PRN, Bowser, Laurel Dimmer, NP   [START ON 01/28/2023] allopurinol (ZYLOPRIM) tablet 100 mg, 100 mg, Oral, Daily, Carlis Abbott, Laura P, DO   apixaban (ELIQUIS) tablet 2.5 mg, 2.5 mg, Oral, BID, Carlis Abbott, Laura P, DO   azithromycin (ZITHROMAX) 500 mg in sodium chloride 0.9 % 250 mL IVPB, 500 mg, Intravenous, Q24H, Julian Hy, DO, Stopped at 01/26/23 1751   [START ON 01/28/2023] calcitRIOL (ROCALTROL) capsule 0.25 mcg, 0.25 mcg, Oral, Daily, Carlis Abbott, Laura P, DO   cefTRIAXone (ROCEPHIN) 2 g in sodium chloride 0.9 % 100 mL IVPB, 2 g, Intravenous, Q24H, Julian Hy, DO, Stopped at 01/26/23 1615   Chlorhexidine Gluconate Cloth 2 % PADS 6 each, 6 each, Topical, Q0600, Roney Jaffe, MD, 6 each at 01/27/23 0900   feeding supplement (NEPRO CARB STEADY) liquid 237 mL, 237 mL, Oral, BID BM, Carlis Abbott, Laura P, DO   [START ON 12/28/1094] folic acid (FOLVITE) tablet 2 mg, 2 mg, Oral, Daily, Carlis Abbott, Laura P, DO   insulin aspart (novoLOG) injection 0-15 Units, 0-15 Units, Subcutaneous, TID WC, Julian Hy, DO, 11 Units at 01/27/23 1259   insulin aspart (novoLOG) injection 0-5 Units, 0-5 Units, Subcutaneous, QHS, Clark, Laura P, DO   insulin aspart (novoLOG) injection 3 Units, 3 Units, Subcutaneous, TID WC, Julian Hy, DO, 3 Units at 01/27/23 1304   insulin glargine-yfgn (SEMGLEE) injection 8 Units, 8 Units, Subcutaneous, BID, Julian Hy, DO, 8 Units at 01/27/23 1309    midodrine (PROAMATINE) tablet 10 mg, 10 mg, Oral, TID WC, Noemi Chapel P, DO, 10 mg at 01/27/23 1304   multivitamin (RENA-VIT) tablet 1 tablet, 1 tablet, Oral, QHS, Clark, Laura P, DO   norepinephrine (LEVOPHED) '4mg'$  in 265m (0.016 mg/mL) premix infusion, 2-10 mcg/min, Intravenous, Titrated, Bowser, Grace E, NP, Last Rate: 11.25 mL/hr at 01/27/23 1400, 3 mcg/min at 01/27/23 1400   ondansetron (ZOFRAN) injection 4 mg, 4 mg, Intravenous, Q6H PRN, CKristopher Oppenheim DO, 4 mg at 01/25/23 0419   Oral care mouth rinse, 15 mL, Mouth Rinse, 4 times per day, Rai, Ripudeep K, MD, 15 mL at 01/27/23 1200   Oral care mouth rinse, 15 mL, Mouth Rinse, PRN, Rai, Ripudeep  K, MD   Oral care mouth rinse, 15 mL, Mouth Rinse, PRN, McQuaid, Douglas B, MD   polyethylene glycol (MIRALAX / GLYCOLAX) packet 17 g, 17 g, Per Tube, Daily PRN, Bowser, Laurel Dimmer, NP   [START ON 01/28/2023] polyethylene glycol (MIRALAX / GLYCOLAX) packet 17 g, 17 g, Oral, Daily, Carlis Abbott, Laura P, DO   pravastatin (PRAVACHOL) tablet 10 mg, 10 mg, Oral, QHS, Clark, Laura P, DO   [COMPLETED] methylPREDNISolone sodium succinate (SOLU-MEDROL) 125 mg/2 mL injection 41.25 mg, 0.5 mg/kg, Intravenous, Q12H, 41.25 mg at 01/17/23 1758 **FOLLOWED BY** [START ON 01/28/2023] predniSONE (DELTASONE) tablet 10 mg, 10 mg, Oral, Daily, Carlis Abbott, Laura P, DO   QUEtiapine (SEROQUEL) tablet 25 mg, 25 mg, Oral, QHS, Candee Furbish, MD, 25 mg at 01/26/23 2217   sodium chloride flush (NS) 0.9 % injection 3 mL, 3 mL, Intravenous, Q12H, Lala Lund K, MD, 3 mL at 01/27/23 2229   sodium chloride flush (NS) 0.9 % injection 3 mL, 3 mL, Intravenous, PRN, Thurnell Lose, MD, 3 mL at 01/20/23 1032   tacrolimus (PROGRAF) capsule 2 mg, 2 mg, Sublingual, BID, Bowser, Laurel Dimmer, NP, 2 mg at 01/26/23 2217  Patients Current Diet:  Diet Order             Diet heart healthy/carb modified Room service appropriate? Yes; Fluid consistency: Thin  Diet effective now                    Precautions / Restrictions Precautions Precautions: Fall, Other (comment) Precaution Comments: watch SpO2 & HR, covid+ Restrictions Weight Bearing Restrictions: No   Has the patient had 2 or more falls or a fall with injury in the past year? No  Prior Activity Level    Prior Functional Level Self Care: Did the patient need help bathing, dressing, using the toilet or eating? Independent  Indoor Mobility: Did the patient need assistance with walking from room to room (with or without device)? Independent  Stairs: Did the patient need assistance with internal or external stairs (with or without device)? Independent  Functional Cognition: Did the patient need help planning regular tasks such as shopping or remembering to take medications? Independent  Patient Information Are you of Hispanic, Latino/a,or Spanish origin?: A. No, not of Hispanic, Latino/a, or Spanish origin What is your race?: A. White Do you need or want an interpreter to communicate with a doctor or health care staff?: 0. No  Patient's Response To:  Health Literacy and Transportation Is the patient able to respond to health literacy and transportation needs?: Yes Health Literacy - How often do you need to have someone help you when you read instructions, pamphlets, or other written material from your doctor or pharmacy?: Never In the past 12 months, has lack of transportation kept you from medical appointments or from getting medications?: No In the past 12 months, has lack of transportation kept you from meetings, work, or from getting things needed for daily living?: No  Development worker, international aid / [place] Devices/Equipment: CPAP Home Equipment: Shower seat - built in, Grab bars - tub/shower  Prior Device Use: Indicate devices/aids used by the patient prior to current illness, exacerbation or injury? None of the above  Current Functional Level Cognition  Overall Cognitive Status:  Impaired/Different from baseline Current Attention Level: Sustained Orientation Level: Oriented X4 (answers all orientation questions appropriately) Following Commands: Follows one step commands consistently, Follows one step commands with increased time Safety/Judgement: Decreased awareness of safety, Decreased awareness of  deficits General Comments: Pt needing increased time to process simple cues.Per wife, pt has been waxing and waning with cognition. Provided some education on ICU delirium.    Extremity Assessment (includes Sensation/Coordination)  Upper Extremity Assessment: Defer to OT evaluation  Lower Extremity Assessment: Generalized weakness, Difficult to assess due to impaired cognition    ADLs  Overall ADL's : Needs assistance/impaired Eating/Feeding: Total assistance Grooming: Minimal assistance, Sitting Upper Body Bathing: Moderate assistance, Sitting Lower Body Bathing: Moderate assistance, Sit to/from stand Upper Body Dressing : Minimal assistance, Sitting Lower Body Dressing: Moderate assistance, Sit to/from stand Toilet Transfer: Minimal assistance, +2 for safety/equipment, +2 for physical assistance, Ambulation, Rolling walker (2 wheels) Toileting- Clothing Manipulation and Hygiene: Maximal assistance, Sit to/from stand Functional mobility during ADLs: Minimal assistance, +2 for physical assistance, +2 for safety/equipment, Moderate assistance General ADL Comments: limited by impiared cognition, poor balance, weakness and cardiopulmonary endurance    Mobility  Overal bed mobility: Needs Assistance Bed Mobility: Supine to Sit Supine to sit: Min assist, HOB elevated General bed mobility comments: Up in chair upon PT arrival.    Transfers  Overall transfer level: Needs assistance Equipment used: Rolling walker (2 wheels) Transfers: Sit to/from Stand Sit to Stand: Min assist General transfer comment: Min A to power to standing with cues for hand placement/technique.     Ambulation / Gait / Stairs / Wheelchair Mobility  Ambulation/Gait Ambulation/Gait assistance: Min assist, Counsellor (Feet): 190 Feet Assistive device: Rolling walker (2 wheels) Gait Pattern/deviations: Step-through pattern, Decreased stride length, Trunk flexed General Gait Details: Slow, mildly unsteady gait at times with chair follow, no evidence of knee buckling. 2 standing rest breaks. Sp02 dropped to 86% on 2L/min 02 [place], cues for pursed lip breathing provided, HR up to 120s bpm Gait velocity: reduced Gait velocity interpretation: 1.31 - 2.62 ft/sec, indicative of limited community ambulator    Posture / Balance Balance Overall balance assessment: Needs assistance Sitting-balance support: Feet supported, No upper extremity supported Sitting balance-Leahy Scale: Fair Postural control: Posterior lean Standing balance support: During functional activity, Bilateral upper extremity supported Standing balance-Leahy Scale: Poor Standing balance comment: Reliant on RW    Special needs/care consideration Dialysis: Hemodialysis Monday, Wednesday, and Friday, Oxygen ***, Skin ***, and Diabetic management ***   Previous Home Environment (from acute therapy documentation) Living Arrangements: Spouse/significant other Available Help at Discharge: Family, Available 24 hours/day Type of Home: House Home Layout: One level Home Access: Stairs to enter Entrance Stairs-Rails: Can reach both Entrance Stairs-Number of Steps: 3 Bathroom Shower/Tub: Multimedia programmer: Standard Bathroom Accessibility: Yes How Accessible: Accessible via walker Aneth: No Additional Comments: wears Bipap at night  Discharge Living Setting Plans for Discharge Living Setting: Patient's home, Lives with (comment) (wife) Type of Home at Discharge: House Discharge Home Layout: One level Discharge Home Access: Stairs to enter Entrance Stairs-Rails: Right, Left, Can reach  both Entrance Stairs-Number of Steps: 3 Discharge Bathroom Shower/Tub: Tub/shower unit Discharge Bathroom Accessibility: Yes How Accessible: Accessible via walker Does the patient have any problems obtaining your medications?: No  Social/Family/Support Systems Patient Roles: Spouse Anticipated Caregiver: wife Anticipated Caregiver's Contact Information: [phone] Caregiver Availability: 24/7 Discharge Plan Discussed with Primary Caregiver: Yes Is Caregiver In Agreement with Plan?: Yes Does Caregiver/Family have Issues with Lodging/Transportation while Pt is in Rehab?: No  Goals Patient/Family Goal for Rehab: Mod I PT, OT Expected length of stay: 5-7 days Pt/Family Agrees to Admission and willing to participate: Yes Program Orientation Provided & Reviewed with  Pt/Caregiver Including Roles  & Responsibilities: Yes  Barriers to Discharge: Insurance for SNF coverage  Decrease burden of Care through IP rehab admission: Othern/a  Possible need for SNF placement upon discharge: not anticipated  Patient Condition: I have reviewed medical records from [facility], spoken with  TOC , and patient and spouse. I met with patient at the bedside for inpatient rehabilitation assessment.  Patient will benefit from ongoing PT and OT, can actively participate in 3 hours of therapy a day 5 days of the week, and can make measurable gains during the admission.  Patient will also benefit from the coordinated team approach during an Inpatient Acute Rehabilitation admission.  The patient will receive intensive therapy as well as Rehabilitation physician, nursing, social worker, and care management interventions.  Due to safety, skin/wound care, disease management, medication administration, pain management, and patient education the patient requires 24 hour a day rehabilitation nursing.  The patient is currently *** with mobility and basic ADLs.  Discharge setting and therapy post discharge at [facility] IP discharge  location:[id number]} is anticipated.  Patient has agreed to participate in the Acute Inpatient Rehabilitation Program and will admit {Time; today/tomorrow:10263}.  Preadmission Screen Completed By:  Nelly Laurence, 01/27/2023 3:11 PM ______________________________________________________________________   Discussed status with Dr. [name] on *** at *** and received approval for admission today.  Admission Coordinator:  Nelly Laurence, time ***/Date ***   Assessment/Plan: Diagnosis: Does the need for close, 24 hr/day Medical supervision in concert with the patient's rehab needs make it unreasonable for this patient to be served in a less intensive setting? {yes_no_potentially:3041433} Co-Morbidities requiring supervision/potential complications: *** Due to {due YQ:0347425}, does the patient require 24 hr/day rehab nursing? {yes_no_potentially:3041433} Does the patient require coordinated care of a physician, rehab nurse, PT, OT, and SLP to address physical and functional deficits in the context of the above medical diagnosis(es)? {yes_no_potentially:3041433} Addressing deficits in the following areas: {deficits:3041436} Can the patient actively participate in an intensive therapy program of at least 3 hrs of therapy 5 days a week? {yes_no_potentially:3041433} The potential for patient to make measurable gains while on inpatient rehab is {potential:3041437} Anticipated functional outcomes upon discharge from inpatient rehab: {functional outcomes:304600100} PT, {functional outcomes:304600100} OT, {functional outcomes:304600100} SLP Estimated rehab length of stay to reach the above functional goals is: *** Anticipated discharge destination: {anticipated dc setting:21604} 10. Overall Rehab/Functional Prognosis: {potential:3041437}   MD Signature: ***

## 2023-01-27 NOTE — Progress Notes (Signed)
eLink Physician-Brief Progress Note Patient Name: Russell Bowers DOB: 1951-09-07 MRN: [id number]   Date of Service  01/27/2023  HPI/Events of Note  Pt extubated, on bipap. No desturations, but with frequent apneic episodes, BIPAP alarming that no pt effort detected.    eICU Interventions  Put in back up rate on BIPAP.  Will recheck ABG in AM to further evaluate.         [place] 01/27/2023, 2:24 AM

## 2023-01-28 DIAGNOSIS — A419 Sepsis, unspecified organism: Secondary | ICD-10-CM

## 2023-01-28 DIAGNOSIS — D849 Immunodeficiency, unspecified: Secondary | ICD-10-CM

## 2023-01-28 DIAGNOSIS — R6521 Severe sepsis with septic shock: Secondary | ICD-10-CM | POA: Insufficient documentation

## 2023-01-28 DIAGNOSIS — N186 End stage renal disease: Secondary | ICD-10-CM | POA: Diagnosis not present

## 2023-01-28 DIAGNOSIS — J9601 Acute respiratory failure with hypoxia: Secondary | ICD-10-CM | POA: Diagnosis not present

## 2023-01-28 DIAGNOSIS — D696 Thrombocytopenia, unspecified: Secondary | ICD-10-CM

## 2023-01-28 LAB — GLUCOSE, CAPILLARY
Glucose-Capillary: 218 mg/dL — ABNORMAL HIGH (ref 70–99)
Glucose-Capillary: 245 mg/dL — ABNORMAL HIGH (ref 70–99)
Glucose-Capillary: 190 mg/dL — ABNORMAL HIGH (ref 70–99)
Glucose-Capillary: 194 mg/dL — ABNORMAL HIGH (ref 70–99)

## 2023-01-28 LAB — CBC WITH DIFFERENTIAL/PLATELET
Abs Immature Granulocytes: 0.17 10*3/uL — ABNORMAL HIGH (ref 0.00–0.07)
Basophils Absolute: 0 10*3/uL (ref 0.0–0.1)
Basophils Relative: 0 %
Eosinophils Absolute: 0.2 10*3/uL (ref 0.0–0.5)
Eosinophils Relative: 2 %
HCT: 39.4 % (ref 39.0–52.0)
Hemoglobin: 12.3 g/dL — ABNORMAL LOW (ref 13.0–17.0)
Immature Granulocytes: 2 %
Lymphocytes Relative: 6 %
Lymphs Abs: 0.6 10*3/uL — ABNORMAL LOW (ref 0.7–4.0)
MCH: 31.6 pg (ref 26.0–34.0)
MCHC: 31.2 g/dL (ref 30.0–36.0)
MCV: 101.3 fL — ABNORMAL HIGH (ref 80.0–100.0)
Monocytes Absolute: 1.1 10*3/uL — ABNORMAL HIGH (ref 0.1–1.0)
Monocytes Relative: 12 %
Neutro Abs: 7.1 10*3/uL (ref 1.7–7.7)
Neutrophils Relative %: 78 %
Platelets: 79 10*3/uL — ABNORMAL LOW (ref 150–400)
RBC: 3.89 MIL/uL — ABNORMAL LOW (ref 4.22–5.81)
RDW: 15.3 % (ref 11.5–15.5)
WBC: 9.1 10*3/uL (ref 4.0–10.5)
nRBC: 0 % (ref 0.0–0.2)

## 2023-01-28 LAB — BASIC METABOLIC PANEL
Anion gap: 13 (ref 5–15)
BUN: 32 mg/dL — ABNORMAL HIGH (ref 8–23)
CO2: 30 mmol/L (ref 22–32)
Calcium: 9.6 mg/dL (ref 8.9–10.3)
Chloride: 91 mmol/L — ABNORMAL LOW (ref 98–111)
Creatinine, Ser: 3.76 mg/dL — ABNORMAL HIGH (ref 0.61–1.24)
GFR, Estimated: 16 mL/min — ABNORMAL LOW (ref >60)
Glucose, Bld: 193 mg/dL — ABNORMAL HIGH (ref 70–99)
Potassium: 4.2 mmol/L (ref 3.5–5.1)
Sodium: 134 mmol/L — ABNORMAL LOW (ref 135–145)

## 2023-01-28 LAB — C-REACTIVE PROTEIN: CRP: 3.9 mg/dL — ABNORMAL HIGH (ref <1.0)

## 2023-01-28 MED ORDER — DOCUSATE SODIUM 100 MG PO CAPS
100.0000 mg | ORAL_CAPSULE | Freq: Two times a day (BID) | ORAL | Status: DC
  Administered 2023-01-28 – 2023-01-30 (×5): 100 mg via ORAL
  Filled 2023-01-28 (×5): qty 1

## 2023-01-28 MED ORDER — INSULIN ASPART 100 UNIT/ML IJ SOLN
3.0000 [IU] | Freq: Three times a day (TID) | INTRAMUSCULAR | Status: DC
  Administered 2023-01-28 – 2023-01-30 (×5): 3 [IU] via SUBCUTANEOUS

## 2023-01-28 MED ORDER — POLYETHYLENE GLYCOL 3350 17 G PO PACK
17.0000 g | PACK | Freq: Every day | ORAL | Status: DC
  Administered 2023-01-28 – 2023-01-29 (×2): 17 g
  Filled 2023-01-28 (×3): qty 1

## 2023-01-28 MED ORDER — INSULIN ASPART 100 UNIT/ML IJ SOLN: 5.0000 [IU] | Freq: Three times a day (TID) | INTRAMUSCULAR | Status: DC

## 2023-01-28 MED ORDER — INSULIN GLARGINE-YFGN 100 UNIT/ML ~~LOC~~ SOLN
10.0000 [IU] | Freq: Two times a day (BID) | SUBCUTANEOUS | Status: DC
  Administered 2023-01-28 – 2023-01-30 (×5): 10 [IU] via SUBCUTANEOUS
  Filled 2023-01-28 (×7): qty 0.1

## 2023-01-28 NOTE — Progress Notes (Signed)
Physical Therapy Treatment Patient Details Name: Russell Bowers MRN: [id number] DOB: [birth year] Today's Date: 01/28/2023   History of Present Illness Pt is a 72 y/o M presenting to ED on 1/23 with cough and SOB with exertion, found to be hypoxic at 78% on RA, COVID positive. Admitted for acute respiratory failure with hypoxia and hypercapnia and AKI. HD initiated 01/18/23. Intubated 1/30 and transferred to ICU, self-extubated 1/31. intubated 2/3-2/4 due to respiratory distress. PMH includes chronic diastolic heart failure, DM2, ESRD with renal transplant on tacrolimus, aortic stenosis, OSA On BiPAP.    PT Comments    Patient progressing with hallway ambulation and noted VSS and pt tolerating well.  Wife present and asking about home activities to do other than walking.  Performed sit <> stand and standing marches, heel raises and LAQ.  Will follow up with formalized HEP for home.  Will benefit from follow up HHPT at d/c as has declined AIR.  PT will follow up.   Recommendations for follow up therapy are one component of a multi-disciplinary discharge planning process, led by the attending physician.  Recommendations may be updated based on patient status, additional functional criteria and insurance authorization.  Follow Up Recommendations  Home health PT     Assistance Recommended at Discharge Frequent or constant Supervision/Assistance  Patient can return home with the following Assistance with cooking/housework;Assist for transportation;Help with stairs or ramp for entrance;A lot of help with walking and/or transfers;A lot of help with bathing/dressing/bathroom;Direct supervision/assist for financial management;Direct supervision/assist for medications management   Equipment Recommendations  Rolling walker (2 wheels)    Recommendations for Other Services       Precautions / Restrictions Precautions Precautions: Fall;Other (comment) Precaution Comments: watch SpO2 & HR, covid+      Mobility  Bed Mobility               General bed mobility comments: Up in chair    Transfers Overall transfer level: Needs assistance Equipment used: Rolling walker (2 wheels) Transfers: Sit to/from Stand Sit to Stand: Min guard           General transfer comment: assist for balance, lines    Ambulation/Gait Ambulation/Gait assistance: Min guard Gait Distance (Feet): 400 Feet Assistive device: Rolling walker (2 wheels) Gait Pattern/deviations: Step-through pattern, Decreased stride length, Trunk flexed       General Gait Details: walked with wife in attendance, noted SpO2 in 80's but HR not correlating and once pt stopped to rest hand on walker (where probe is located) and quickly corrected to correlate with HR and SpO2 in 90's; several episodes bumping into obstacles on R side with cues and minguard assist for maneuvering   Stairs             Wheelchair Mobility    Modified Rankin (Stroke Patients Only)       Balance Overall balance assessment: Needs assistance Sitting-balance support: Feet supported, No upper extremity supported Sitting balance-Leahy Scale: Good     Standing balance support: During functional activity, Bilateral upper extremity supported, Single extremity supported, Reliant on assistive device for balance Standing balance-Leahy Scale: Poor Standing balance comment: Reliant on RW w/flexed posture                            Cognition Arousal/Alertness: Awake/alert Behavior During Therapy: WFL for tasks assessed/performed Overall Cognitive Status: Within Functional Limits for tasks assessed  Exercises Other Exercises Other Exercises: sit<>stand x  5 cues for minimal UE use Other Exercises: standing marching x 5 Other Exercises: heel raises x 5 standing Other Exercises: LAQ    General Comments General comments (skin integrity, edema, etc.): VSS on 1L O2  with ambulation      Pertinent Vitals/Pain Pain Assessment Pain Assessment: No/denies pain    Home Living                          Prior Function            PT Goals (current goals can now be found in the care plan section) Progress towards PT goals: Progressing toward goals    Frequency    Min 3X/week      PT Plan Discharge plan needs to be updated    Co-evaluation              AM-PAC PT "6 Clicks" Mobility   Outcome Measure  Help needed turning from your back to your side while in a flat bed without using bedrails?: A Little Help needed moving from lying on your back to sitting on the side of a flat bed without using bedrails?: A Little Help needed moving to and from a bed to a chair (including a wheelchair)?: A Little   Help needed to walk in hospital room?: A Little Help needed climbing 3-5 steps with a railing? : Total 6 Click Score: 13    End of Session Equipment Utilized During Treatment: Gait belt;Oxygen Activity Tolerance: Patient tolerated treatment well Patient left: in chair;with chair alarm set;with call bell/phone within reach   PT Visit Diagnosis: Unsteadiness on feet (R26.81);Muscle weakness (generalized) (M62.81)     Time: 0109-3235 PT Time Calculation (min) (ACUTE ONLY): 24 min  Charges:  $Gait Training: 8-22 mins $Therapeutic Activity: 8-22 mins                     [name], PT Acute Rehabilitation Services Office:[phone] 01/28/2023    [name] 01/28/2023, 2:54 PM

## 2023-01-28 NOTE — Progress Notes (Signed)
Pt has home Bipap machine. No assistance needed.

## 2023-01-28 NOTE — Progress Notes (Signed)
Inpatient Rehabilitation Admissions Coordinator   I met with patient and his wife at bedside. He has taken 2 laps on unit today and continue to prefer home with [facility] when medically ready for d/c. I will not pursue Cir at this time.  Danne Baxter, RN, MSN Rehab Admissions Coordinator [phone] 01/28/2023 12:46 PM

## 2023-01-28 NOTE — Progress Notes (Signed)
Occupational Therapy Treatment Patient Details Name: Russell Bowers MRN: [id number] DOB: 1951-03-10 Today's Date: 01/28/2023   History of present illness Pt is a 72 y/o M presenting to ED on 1/23 with cough and SOB with exertion, found to be hypoxic at 78% on RA, COVID positive. Admitted for acute respiratory failure with hypoxia and hypercapnia and AKI. HD initiated 01/18/23. Intubated 1/30 and transferred to ICU, self-extubated 1/31. intubated 2/3-2/4 due to respiratory distress. PMH includes chronic diastolic heart failure, DM2, ESRD with renal transplant on tacrolimus, aortic stenosis, OSA On BiPAP.   OT comments  Patient with good progress toward patient focused goals.  Spouse noting cognition continues to improve, near baseline, and patient needing generalized supervision with occasional Min guard for mobility and ADL completion.  AIR continues to follow, but they are hoping to return home with [facility] if he continues to progress.  OT will continue to follow and address deficits noted.     Recommendations for follow up therapy are one component of a multi-disciplinary discharge planning process, led by the attending physician.  Recommendations may be updated based on patient status, additional functional criteria and insurance authorization.    Follow Up Recommendations  Acute inpatient rehab (3hours/day)     Assistance Recommended at Discharge Frequent or constant Supervision/Assistance  Patient can return home with the following  A lot of help with walking and/or transfers;A lot of help with bathing/dressing/bathroom;Assistance with cooking/housework;Direct supervision/assist for medications management;Direct supervision/assist for financial management;Assist for transportation;Help with stairs or ramp for entrance   Equipment Recommendations       Recommendations for Other Services      Precautions / Restrictions Precautions Precautions: Fall;Other (comment) Precaution Comments:  watch SpO2 & HR, covid+ Restrictions Weight Bearing Restrictions: No       Mobility Bed Mobility               General bed mobility comments: Up in chair upon PT arrival. Patient Response: Cooperative  Transfers Overall transfer level: Needs assistance Equipment used: Rolling walker (2 wheels) Transfers: Sit to/from Stand, Bed to chair/wheelchair/BSC Sit to Stand: Supervision     Step pivot transfers: Supervision, Min guard           Balance Overall balance assessment: Needs assistance Sitting-balance support: Feet supported, No upper extremity supported Sitting balance-Russell Bowers: Good     Standing balance support: Reliant on assistive device for balance Standing balance-Russell Bowers: Poor                             ADL either performed or assessed with clinical judgement   ADL       Grooming: Supervision/safety;Standing;Wash/dry hands;Wash/dry face           Upper Body Dressing : Set up;Sitting   Lower Body Dressing: Supervision/safety;Sit to/from stand   Toilet Transfer: Supervision/safety;Rolling walker (2 wheels);Regular Toilet;Ambulation                  Extremity/Trunk Assessment Upper Extremity Assessment Upper Extremity Assessment: Overall WFL for tasks assessed   Lower Extremity Assessment Lower Extremity Assessment: Defer to PT evaluation   Cervical / Trunk Assessment Cervical / Trunk Assessment: Normal    Vision Patient Visual Report: No change from baseline     Perception Perception Perception: Not tested   Praxis Praxis Praxis: Not tested    Cognition Arousal/Alertness: Awake/alert Behavior During Therapy: WFL for tasks assessed/performed Overall Cognitive Status: Within Functional Limits for tasks assessed  Exercises      Shoulder Instructions       General Comments  HR up to 121    Pertinent Vitals/ Pain       Pain Assessment Pain  Assessment: No/denies pain                                                          Frequency  Min 2X/week        Progress Toward Goals  OT Goals(current goals can now be found in the care plan section)  Progress towards OT goals: Progressing toward goals  Acute Rehab OT Goals Patient Stated Goal: Return home OT Goal Formulation: With patient Time For Goal Achievement: 02/01/23 Potential to Achieve Goals: Good  Plan Discharge plan remains appropriate    Co-evaluation                 AM-PAC OT "6 Clicks" Daily Activity     Outcome Measure   Help from another person eating meals?: None Help from another person taking care of personal grooming?: None Help from another person toileting, which includes using toliet, bedpan, or urinal?: A Little Help from another person bathing (including washing, rinsing, drying)?: A Little Help from another person to put on and taking off regular upper body clothing?: None Help from another person to put on and taking off regular lower body clothing?: A Little 6 Click Score: 21    End of Session Equipment Utilized During Treatment: Rolling walker (2 wheels);Oxygen  OT Visit Diagnosis: Unsteadiness on feet (R26.81);Other abnormalities of gait and mobility (R26.89);Muscle weakness (generalized) (M62.81)   Activity Tolerance Patient tolerated treatment well   Patient Left in bed;with call bell/phone within reach;with family/visitor present   Nurse Communication Mobility status        Time: 5956-3875 OT Time Calculation (min): 21 min  Charges: OT General Charges $OT Visit: 1 Visit OT Treatments $Self Care/Home Management : 8-22 mins  01/28/2023  Russell Bowers  Acute Rehabilitation Services  Office:  [phone]   Russell Bowers 01/28/2023, 9:47 AM

## 2023-01-28 NOTE — Progress Notes (Signed)
NAME:  Russell Bowers, MRN:  [id number], DOB:  Feb 28, 1951, LOS: 12 ADMISSION DATE:  01/14/2023, CONSULTATION DATE:  1/30 REFERRING MD:  Verlon Au, CHIEF COMPLAINT:  dyspnea, confusion   History of Present Illness:  72 year old male with a past medical history significant for renal transplant among multiple other problems was admitted on January 14, 2023 in the context of shortness of breath, hypoxemia from COVID-19. He was admitted by the hospitalist service, has been treated with Solu-Medrol, ceftriaxone and azithromycin. He has had progressive renal failure over the last several months and during this admission started on hemodialysis.  During this admission he's had up to 9 liters of fluid removed via hemodialysis. He was intubated on 1/30 for hypercarbic respiratory failure noted to have some mild bronchomalacia via bronchoscopy.  He self extubated 1/31 early AM, was stable on high flow nasal cannula, moved out of ICU on 2/1. 2/3 PCCM called back due to worsening lethargy. Wife reports that he was up all night 2/2>2/3 AM, was talkative morning of 2/3 but lethargic.  Got phenergan around noon and mental status did not improve.  PCCM consulted for evaluation.  Has not received other sedatives/narcotics today.  Last HD was on 2/2.  Per his wife he has not slept on BIPAP since leaving the ICU.  He uses that therapy at home.   Pertinent  Medical History   Past Medical History:  Diagnosis Date   ADHD (attention deficit hyperactivity disorder)    Allergic rhinitis    Cancer ([place])    Prostate cancer   Cellulitis and abscess of trunk 2014   history of back abscess   Cellulitis and abscess of unspecified site 2014   lower back   Detached retina    Diabetes mellitus without complication (HCC)    Elevated troponin    Fistula    OF LEFT ARM   Gout    H/O kidney transplant    History of congestive heart failure    FOLLOWED BY DR. 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 Melvyn Novas   Hypertension    Mild concentric left ventricular hypertrophy (LVH)    AND AORTIC STENOSIS FOLLOWED IN THE PAST BY CARDIOLOGIST DR. Marlou Porch.   OSA treated with BiPAP    Personal history of colonic polyps    Prostate cancer ([place]) 11/2010   PROSTATECTOMY WITH UROLOGIST DR. DAVIS   Renal failure    Shortness of breath    Sleep disorder breathing    FOLLOWED BY PULMONOLOGIST DR. [place] Hospital Events: Including procedures, antibiotic start and stop dates in addition to other pertinent events   1/30 ABG showing hypercapnic RF. BIPAP placed without significant improvement. PCCM consulted for intubation. 1/31 Self-extubated overnight.  2/1 Improved from a respiratory standpoint, HHFNC 55L/FiO2 75%. 2/2 HD again 2/3 progressive lethargy, intubated overnight 2/4 HD, then extubated 2/6 off levo; transferring to progressive  Interim History / Subjective:  Off levo; bp stable On 2L [place] Only complaint is he feels constipated; hasn't had bowel mvt in a few days   Objective   Blood pressure 105/71, pulse (!) 120, temperature 98.8 F (37.1 C), temperature source Oral, resp. rate 20, height '5\' 9"'$  (1.753 m), weight 79 kg, SpO2 96 %.    Vent Mode: BIPAP FiO2 (%):  [30 %] 30 % Set Rate:  [16 bmp] 16 bmp   Intake/Output Summary (  Last 24 hours) at 01/28/2023 0858 Last data filed at 01/28/2023 0600 Gross per 24 hour  Intake 535.78 ml  Output 3310 ml  Net -2774.22 ml    Filed Weights   01/27/23 1400 01/27/23 1906 01/28/23 0500  Weight: 82.2 kg 79.1 kg 79 kg    Examination: General:  NAD HEENT: MM pink/moist; [place] in place Neuro: Aox3; MAE CV: s1s2, tachy 110s, no m/r/g PULM:  dim clear BS bilaterally; [place] 2L GI: soft, bsx4 active  Extremities: warm/dry, trace ble edema  Skin: no rashes or lesions    Resolved Hospital Problem list     Assessment &  Plan:  Acute on chronic respiratory failure with hypercarbia- exacerbated by sedating medications, not wearing BIPAP; has proven he needs nocturnal ventilation assistance forthcoming. Tracheobronchomalacia- complicating recovery OSA on bipap at home -followed by Dr. Elsworth Soho outpt Plan: -wean [place] for sats >92% -pulm toiletry: IS/Flutter -PT/OT/OOB -BiPAP QHS  Shock, ongoing despite being off sedation Plan: -off levo; map goal >65 -cont midodrine tid  Chronic RV failure-- dates back to 2022 on echo -may need further workup of this as an outpatient. Suspect this is due to left heart disease.   Plan: -further f/u outpt -iHD for volume removal -strict I/o's; daily weights  Questionable CAP vs. COVID- x-ray improved rapidly with fluid removal.  I do not think he has COVID pneumonia.  COVID + 01/14/23.   Plan: -azithro completed; cont rocephin  ESRD, s/p failed renal transplant hyponatremia Immunocompromised Plan; -nephro following -cont iHD MWF; may need albumin if gets hypotensive during dialysis -cont tacrolimus, prednisone -Trend BMP / urinary output -Replace electrolytes as indicated -Avoid nephrotoxic agents, ensure adequate renal perfusion  DM2 with uncontrolled hyperglycemia Plan: -CBG above goal 140-180; recently started on steroids 2/6 -cont ssi and meal coverage -increase glargine  Afib, newly diagnosed -new start on Eliquis Plan: -cont eliquis -telemetry monitoring -consider starting metoprolol for rate control as bp improves  Constipation Plan: -scheduled bowel regimen  HLD Plan: -cont statin  Gout Plan: -cont allopurinol  Deconditioning Plan: -PT/OT  Best Practice (right click and "Reselect all SmartList Selections" daily)   Diet/type: progress as tolerated DVT prophylaxis: DOAC GI prophylaxis: N/A Lines: N/A Foley:  N/A Code Status:  full code Last date of multidisciplinary goals of care discussion [2/6 spoke with wife and patient and  updated at bedside]     Mikki Harbor, PA-C [facility] 01/28/2023, 9:31 AM  Please see [email or web address] for pager details.  From 7A-7P if no response, please call [phone]. After hours, please call ELink [phone].

## 2023-01-28 NOTE — TOC Progression Note (Signed)
Transition of Care [facility]) - Progression Note    Patient Details  Name: Russell Bowers MRN: [id number] Date of Birth: May 25, 1951  Transition of Care [facility]) CM/SW Contact  Graves-Bigelow, Ocie Cornfield, RN Phone Number: 01/28/2023, 3:36 PM  Clinical Narrative:  Patient was discussed in progression rounds this am. Alvis Lemmings continues to follow the patient for home health PT-patient will need home health orders once stable. Case Manager following for DME needs as the patient gets closer to transition home.   Expected Discharge Plan: [place] Barriers to Discharge: Continued Medical Work up  Expected Discharge Plan and Services In-house Referral: NA Discharge Planning Services: CM Consult Post Acute Care Choice: [place] arrangements for the past 2 months: Single Family Home                 DME Arranged: Walker rolling DME Agency: [employer] Date DME Agency Contacted: 01/20/23 Time DME Agency Contacted: [id number] Representative spoke with at DME Agency: parachute [place]: PT [place]: [place] Date [place]: 01/20/23 Time [place]: 9371 Representative spoke with at [place]: [place] ([place]) Interventions [place]: No Food Insecurity (01/25/2023)  Housing: [place]  (01/25/2023)  Transportation Needs: No Transportation Needs (01/25/2023)  Utilities: Not At Risk (01/25/2023)  Tobacco Use: Medium Risk (01/14/2023)    Readmission Risk Interventions    01/20/2023   12:09 PM  Readmission Risk Prevention Plan  Transportation Screening Complete  PCP or Specialist Appt within 3-5 Days Complete  HRI or [place] Complete  Palliative Care Screening Not Applicable  Medication Review (RN Care Manager) Complete

## 2023-01-28 NOTE — Discharge Instructions (Signed)

## 2023-01-28 NOTE — Progress Notes (Signed)
[facility] Progress Note  Assessment/ Plan: AKI on CKD5 progressing to ESRD - b/l creat 4.1 from sept 2023, eGFR 15 ml/min. Creat here 5.2 on admission in the setting of 1 month coughing, poor po intake and newly + here for COVID infection. BP's were low, CT w/o edema, CXR w/o edema. UA +wbcs, renal US unremarkable. Mild pedal edema on initial exam. Viral pna is likely given hypoxia. AKI felt to be intravasc vol depletion vs vol overload vs progression of CKD.  Tried cautious IVF's w/ no improvement in B/Cr or UOP, then tried IV lasix + albumin also w/o good response. B/Cr continue to rise and pt more lethargic and confused c/w uremia. Had 1st HD 1/27 and has required since.  Last HD 2/5, plan next 2/7.   Appreciate renal navigator finding a MWF spot for him at [address]. Prefers [address] and renal navigator is aware but no spots available.   Suspect he has progressed to ESRD; Dr. Carolin Sicks who has been following the pt as an outpt in clinic for many years agreed as well.   Pulm edema - improving - cont to lower vol w/ HD Renal transplant - done in 2012, w/ function slowly failing. He was CKD stage 5 in sept 2023. Taking only prograf at '2mg'$  am and '2mg'$  pm (pm dose decr from 3) because of high level 9.1 (3.0-8.0). And likely can start to wean off as outpt slowly especially if he's not a future candidate for another transplant. BP - BP's normal to low, started midodrine 10 tid - continue COVID infection - PCCM thinks not COVID PNA; rec'd CAP coverage already Atrial fib - seen by cards DM2 per pmd  Subjective: no new complaints - on bipap this AM - it's a home therapy that was resumed.  Tolerated UF 3L with HD last PM.  No issues.    Vitals:   01/28/23 0400 01/28/23 0500 01/28/23 0600 01/28/23 0700  BP: 135/61 (!) 99/59 91/66 105/71  Pulse: (!) 104 (!) 107 (!) 104 (!) 107  Resp: (!) '26 12 16 16  '$ Temp:      TempSrc:      SpO2: 94% 98% 97% 97%  Weight:  79 kg    Height:         Exam: Gen alert, no distress Chest dec'd L base, bilat rhonchi Irreg irreg irreg, HR 90s no MRG Abd soft ntnd no mass or ascites +bs Ext mild pretib/ pedal edema Neuro is alert, Ox 3 , nf, no asterixis  LUA AVF +thrill     Home meds include - allopurinol, sensipar 30 qd, rocaltrol 0.25 mcg qd, pepcid, lasix 40 qd, glipizide, insulin glargine, toprol xl 50, pravachol, prograf '2mg'$  am and '3mg'$  pm, prns/ vits/ supps       Date                          Creat               eGFR    2014                         2.1- 2.7    2015                         1.5- 2.1    Feb- aug 2022         3.0- 3.5  18- 21    Sept 2022                 3.65                 17 ml/min    Sept 2023                 4.09                 15 ml/min    01/14/23                     5.37                     01/15/23                     5.27                 11 ml/min     CXR 1/23 - IMPRESSION: 1. Reticulonodular opacities bilaterally, which may reflect atypical/viral infection.   CR chest noncont - Lungs/Pleura: Central airways are patent. Bibasilar linear opacities similar to prior exam and likely due to scarring. Small bilateral pleural effusions    CXR 1/27 - marked worsening of bilat effusions and pulm edema    UCr 134, UNa < 10    UA - prot 100, many bact, 0-5 rbc, >50 wbc    Renal US transplant - normal [place], no hydro, nonspecific elevated intrarenal resistive indices.         Recent Labs  Lab 01/24/23 0920 01/25/23 0055 01/26/23 0241 01/27/23 0543 01/27/23 0838 01/27/23 1102 01/28/23 0056  HGB 12.1*   < > 12.9*   < > 13.3  --  12.3*  ALBUMIN 3.7  --   --   --   --  3.6  --   CALCIUM 9.1  --  8.6*  --   --  9.1  --   PHOS 4.0  --   --   --   --  4.9*  --   CREATININE 4.98*  --  3.79*  --   --  4.70*  --   K 3.5   < > 3.9  --  3.9 3.9  --    < > = values in this interval not displayed.    No results for input(s): "IRON", "TIBC", "FERRITIN" in the last 168 hours.  Inpatient medications:   allopurinol  100 mg Oral Daily   apixaban  2.5 mg Oral BID   calcitRIOL  0.25 mcg Oral Daily   Chlorhexidine Gluconate Cloth  6 each Topical Q0600   feeding supplement (NEPRO CARB STEADY)  237 mL Oral BID BM   folic acid  2 mg Oral Daily   insulin aspart  0-15 Units Subcutaneous TID WC   insulin aspart  0-5 Units Subcutaneous QHS   insulin aspart  3 Units Subcutaneous TID WC   insulin glargine-yfgn  8 Units Subcutaneous BID   midodrine  10 mg Oral TID WC   multivitamin  1 tablet Oral QHS   mouth rinse  15 mL Mouth Rinse 4 times per day   pravastatin  10 mg Oral QHS   predniSONE  10 mg Oral Daily   QUEtiapine  25 mg Oral QHS   sodium chloride flush  3 mL Intravenous Q12H   tacrolimus  2 mg Sublingual BID    sodium chloride Stopped (01/19/23 1701)   sodium chloride  Stopped (01/22/23 1549)   sodium chloride Stopped (01/26/23 1113)   cefTRIAXone (ROCEPHIN)  IV 200 mL/hr at 01/28/23 0600   norepinephrine (LEVOPHED) Adult infusion 4 mcg/min (01/28/23 0600)   sodium chloride, acetaminophen **OR** acetaminophen, ondansetron (ZOFRAN) IV, mouth rinse, mouth rinse, polyethylene glycol, sodium chloride flush

## 2023-01-29 DIAGNOSIS — Z992 Dependence on renal dialysis: Secondary | ICD-10-CM

## 2023-01-29 DIAGNOSIS — E1169 Type 2 diabetes mellitus with other specified complication: Secondary | ICD-10-CM

## 2023-01-29 DIAGNOSIS — E440 Moderate protein-calorie malnutrition: Secondary | ICD-10-CM

## 2023-01-29 DIAGNOSIS — N186 End stage renal disease: Secondary | ICD-10-CM

## 2023-01-29 DIAGNOSIS — E785 Hyperlipidemia, unspecified: Secondary | ICD-10-CM

## 2023-01-29 LAB — CBC WITH DIFFERENTIAL/PLATELET
Abs Immature Granulocytes: 0.14 10*3/uL — ABNORMAL HIGH (ref 0.00–0.07)
Basophils Absolute: 0 10*3/uL (ref 0.0–0.1)
Basophils Relative: 0 %
Eosinophils Absolute: 0.1 10*3/uL (ref 0.0–0.5)
Eosinophils Relative: 1 %
HCT: 36.5 % — ABNORMAL LOW (ref 39.0–52.0)
Hemoglobin: 11.5 g/dL — ABNORMAL LOW (ref 13.0–17.0)
Immature Granulocytes: 2 %
Lymphocytes Relative: 7 %
Lymphs Abs: 0.6 10*3/uL — ABNORMAL LOW (ref 0.7–4.0)
MCH: 31.2 pg (ref 26.0–34.0)
MCHC: 31.5 g/dL (ref 30.0–36.0)
MCV: 98.9 fL (ref 80.0–100.0)
Monocytes Absolute: 0.9 10*3/uL (ref 0.1–1.0)
Monocytes Relative: 11 %
Neutro Abs: 6.8 10*3/uL (ref 1.7–7.7)
Neutrophils Relative %: 79 %
Platelets: 71 10*3/uL — ABNORMAL LOW (ref 150–400)
RBC: 3.69 MIL/uL — ABNORMAL LOW (ref 4.22–5.81)
RDW: 15 % (ref 11.5–15.5)
WBC: 8.6 10*3/uL (ref 4.0–10.5)
nRBC: 0 % (ref 0.0–0.2)

## 2023-01-29 LAB — RENAL FUNCTION PANEL
Albumin: 3.1 g/dL — ABNORMAL LOW (ref 3.5–5.0)
Anion gap: 11 (ref 5–15)
BUN: 46 mg/dL — ABNORMAL HIGH (ref 8–23)
CO2: 27 mmol/L (ref 22–32)
Calcium: 9.2 mg/dL (ref 8.9–10.3)
Chloride: 93 mmol/L — ABNORMAL LOW (ref 98–111)
Creatinine, Ser: 4.46 mg/dL — ABNORMAL HIGH (ref 0.61–1.24)
GFR, Estimated: 13 mL/min — ABNORMAL LOW (ref >60)
Glucose, Bld: 148 mg/dL — ABNORMAL HIGH (ref 70–99)
Phosphorus: 3.7 mg/dL (ref 2.5–4.6)
Potassium: 3.4 mmol/L — ABNORMAL LOW (ref 3.5–5.1)
Sodium: 131 mmol/L — ABNORMAL LOW (ref 135–145)

## 2023-01-29 LAB — GLUCOSE, CAPILLARY
Glucose-Capillary: 162 mg/dL — ABNORMAL HIGH (ref 70–99)
Glucose-Capillary: 109 mg/dL — ABNORMAL HIGH (ref 70–99)
Glucose-Capillary: 93 mg/dL (ref 70–99)
Glucose-Capillary: 257 mg/dL — ABNORMAL HIGH (ref 70–99)

## 2023-01-29 LAB — C-REACTIVE PROTEIN: CRP: 3.1 mg/dL — ABNORMAL HIGH (ref <1.0)

## 2023-01-29 LAB — MAGNESIUM: Magnesium: 1.8 mg/dL (ref 1.7–2.4)

## 2023-01-29 MED ORDER — METOPROLOL TARTRATE 50 MG PO TABS: 50.0000 mg | ORAL_TABLET | Freq: Two times a day (BID) | ORAL | Status: DC

## 2023-01-29 MED ORDER — METOPROLOL TARTRATE 50 MG PO TABS
50.0000 mg | ORAL_TABLET | Freq: Two times a day (BID) | ORAL | Status: DC
  Administered 2023-01-29 – 2023-01-30 (×2): 50 mg via ORAL
  Filled 2023-01-29 (×2): qty 1

## 2023-01-29 NOTE — Hospital Course (Addendum)
[name] was admitted to the hospital with the working diagnosis of respiratory failure pulmonary edema, progressive renal failure, COVID 19 infection.   72 year old male with a past medical history significant for renal transplant, G2BW, diastolic heart failure, prostate cancer sp radical prostatectomy in 2011. Reported dyspnea for 7 days, along with weight gain and cough. Tested positive for COVID 19 on 12/29/22. On his initial physical examination his blood pressure was 125/87, HR 96, RR 26 and 02 saturation 78%, patient was in respiratory distress and was placed on Bipap, his lungs had no wheezing or rhonchi, heart with S1 and S2 present and rhythmic, positive murmur systolic at the right sternal border, abdomen with no distention, and positive lower extremity edema.   VBG 7.20/ 77/ 19/ 30/ 21% Na 136, K 4,0, Cl 100 bicarbonate 27 glucose 192, bun 81 cr 5,37  BNP 1,704  High sensitive troponin 23 and 22  Wbc 5.9 hgb 14,8 plt 138  Sars covid 19 positive  Urine analysis SG 1,010, 100 protein, > 50 wbc   Chest radiograph with increase markings bilaterally. Chest CT with bilateral ground glass opacities and small bilateral pleural effusions. Solid pulmonary nodule of the posterior left upper lobe measuring 5 mm.  EKG 93 bpm, normal axis, right bundle branch block, atrial fibrillation rhythm with no significant ST segment or T wave changes.   He was treated with Solu-Medrol, ceftriaxone and azithromycin.   01/27 patient with progressive renal failure and required starting renal replacement therapy.   He developed progressive respiratory failure and was intubated on 1/30 for hypercarbic respiratory failure noted to have some mild bronchomalacia via bronchoscopy.    He self extubated 1/31 early AM, was stable on high flow nasal cannula, 55 L with 75% Fi02. moved out of ICU on   2/3 progressive lethargy, intubated overnight. Developed hypotension requiring norepinephrine infusion.  2/4  extubated  2/6 weaned off vasopressors.    02/07 transfer to [facility].  02/08 volume status continue to improve with intermittent renal replacement therapy. Plan to continue midodrine for blood pressure support.

## 2023-01-29 NOTE — Assessment & Plan Note (Signed)
Patient was placed on insulin therapy for glucose control.  At the time of his discharge will continue basal insulin 10 units.   On statin therapy.

## 2023-01-29 NOTE — Assessment & Plan Note (Addendum)
Acute hypoxemic and hypercapnic respiratory failure.  Pulmonary edema (acute)/ volume overload.    Patient has been on intermittent renal replacement therapy with improvement in his volume status.  Continue midodrine for blood pressure support.   At the time of his discharge his oxygenation is 94% on room air.

## 2023-01-29 NOTE — Progress Notes (Signed)
[facility] Progress Note  Assessment/ Plan: AKI on CKD5 progressing to ESRD - b/l creat 4.1 from sept 2023, eGFR 15 ml/min. Creat here 5.2 on admission in the setting of 1 month coughing, poor po intake and newly + here for COVID infection. BP's were low, CT w/o edema, CXR w/o edema. UA +wbcs, renal US unremarkable. Mild pedal edema on initial exam. Viral pna is likely given hypoxia. AKI felt to be intravasc vol depletion vs vol overload vs progression of CKD.  Tried cautious IVF's w/ no improvement in B/Cr or UOP, then tried IV lasix + albumin also w/o good response. B/Cr continue to rise and pt more lethargic and confused c/w uremia. Had 1st HD 1/27 and has required since.  Last HD 2/5, plan next today.   Appreciate renal navigator finding a MWF spot for him at [address]. Prefers [address] and renal navigator is aware but no spots available.   Suspect he has progressed to ESRD; Dr. Carolin Sicks who has been following the pt as an outpt in clinic for many years agreed as well.   Pulm edema - improved - cont to lower vol w/ HD but approaching EDW Renal transplant - done in 2012, w/ function slowly failing. He was CKD stage 5 in sept 2023. Taking only prograf at '2mg'$  am and '2mg'$  pm (pm dose decr from 3) because of high level 9.1 (3.0-8.0). And likely can start to wean off as outpt slowly especially if he's not a future candidate for another transplant. BP - BP's normal to low, started midodrine 10 tid - continue COVID infection - PCCM thinks not COVID PNA; rec'd CAP coverage already; off isolation now Atrial fib - seen by cards DM2 per pmd Anemia - mild - no ESA indication currently Hypokalemia - use 4K dialysate today  OK for d/c from nephrology perspective.   Subjective: no new complaints - for dialysis today.  Plan is d/c home with [facility] - he declined CIR and seems to be doing ok    Vitals:   01/28/23 1653 01/28/23 1955 01/29/23 0639 01/29/23 0811  BP: 125/75 112/68 122/78  131/72  Pulse:  (!) 103 (!) 102 100  Resp:  19 20 (!) 21  Temp:  98.1 F (36.7 C) 98.1 F (36.7 C) 98.2 F (36.8 C)  TempSrc:  Oral Oral Oral  SpO2:  93% 100% 92%  Weight:   80.3 kg   Height:        Exam: Gen alert, no distress Chest dec'd L base, bilat rhonchi Irreg irreg irreg, HR 90s no MRG Abd soft ntnd no mass or ascites +bs Ext mild pretib/ pedal edema Neuro is alert, Ox 3 , nf, no asterixis  LUA AVF +thrill     Home meds include - allopurinol, sensipar 30 qd, rocaltrol 0.25 mcg qd, pepcid, lasix 40 qd, glipizide, insulin glargine, toprol xl 50, pravachol, prograf '2mg'$  am and '3mg'$  pm, prns/ vits/ supps       Date                          Creat               eGFR    2014                         2.1- 2.7    2015  1.5- 2.1    Feb- aug 2022         3.0- 3.5            18- 21    Sept 2022                 3.65                 17 ml/min    Sept 2023                 4.09                 15 ml/min    01/14/23                     5.37                     01/15/23                     5.27                 11 ml/min     CXR 1/23 - IMPRESSION: 1. Reticulonodular opacities bilaterally, which may reflect atypical/viral infection.   CR chest noncont - Lungs/Pleura: Central airways are patent. Bibasilar linear opacities similar to prior exam and likely due to scarring. Small bilateral pleural effusions    CXR 1/27 - marked worsening of bilat effusions and pulm edema    UCr 134, UNa < 10    UA - prot 100, many bact, 0-5 rbc, >50 wbc    Renal US transplant - normal [place], no hydro, nonspecific elevated intrarenal resistive indices.         Recent Labs  Lab 01/27/23 1102 01/28/23 0056 01/28/23 1128 01/29/23 0255  HGB  --  12.3*  --  11.5*  ALBUMIN 3.6  --   --  3.1*  CALCIUM 9.1  --  9.6 9.2  PHOS 4.9*  --   --  3.7  CREATININE 4.70*  --  3.76* 4.46*  K 3.9  --  4.2 3.4*    No results for input(s): "IRON", "TIBC", "FERRITIN" in the last 168  hours.  Inpatient medications:  allopurinol  100 mg Oral Daily   apixaban  2.5 mg Oral BID   calcitRIOL  0.25 mcg Oral Daily   docusate sodium  100 mg Oral BID   feeding supplement (NEPRO CARB STEADY)  237 mL Oral BID BM   folic acid  2 mg Oral Daily   insulin aspart  0-15 Units Subcutaneous TID WC   insulin aspart  0-5 Units Subcutaneous QHS   insulin aspart  3 Units Subcutaneous TID WC   insulin glargine-yfgn  10 Units Subcutaneous BID   midodrine  10 mg Oral TID WC   multivitamin  1 tablet Oral QHS   mouth rinse  15 mL Mouth Rinse 4 times per day   polyethylene glycol  17 g Per Tube Daily   pravastatin  10 mg Oral QHS   QUEtiapine  25 mg Oral QHS   sodium chloride flush  3 mL Intravenous Q12H   tacrolimus  2 mg Sublingual BID    sodium chloride 250 mL (01/28/23 1753)   sodium chloride Stopped (01/22/23 1549)   sodium chloride Stopped (01/26/23 1113)   cefTRIAXone (ROCEPHIN)  IV 2 g (01/28/23 1754)   sodium chloride, acetaminophen **OR** acetaminophen, ondansetron (ZOFRAN) IV, mouth rinse, mouth rinse, sodium chloride flush

## 2023-01-29 NOTE — Care Management Important Message (Signed)
Important Message  Patient Details  Name: Russell Bowers MRN: [id number] Date of Birth: [birth year]   Medicare Important Message Given:  Yes     [name] 01/29/2023, 12:34 PM

## 2023-01-29 NOTE — Procedures (Signed)
HD Note:  Some information was entered later than the data was gathered due to patient care needs. The stated time with the data is accurate.  Patient treated at bedside. Alert and oriented.  Informed consent signed and in chart.   TX duration:3 hours  Patient tolerated well. The patient's BP did go below 100 a couple of times and the goal was lowered to accommodate.  See flowsheet for detail.  Alert, without acute distress.  Hand-off given to patient's nurse.   Access used: Left lower arm AVF Access issues: None  Total UF removed: 2000 ml   [facility]

## 2023-01-29 NOTE — Assessment & Plan Note (Addendum)
Progressed to ESRD from CKD stage 5 Hyponatremia, hypokalemia.  Currently on renal replacement therapy Plan for Monday, Wednesday and Friday scheduled for intermittent HD.   Anemia of chronic renal disease.   Metabolic bone disease continue with calcitriol and cinacalcet.   SP renal transplant, continue with tacrolimus. Dose decreased to 2 mg in am and 2 mg in pm because a high level 9,1. Can start to wean off as outpatient slowly per nephrology recommendations.

## 2023-01-29 NOTE — Progress Notes (Signed)
Physical Therapy Treatment Patient Details Name: Russell Bowers MRN: [id number] DOB: 1951/10/16 Today's Date: 01/29/2023   History of Present Illness Pt is a 72 y/o M presenting to ED on 1/23 with cough and SOB with exertion, found to be hypoxic at 78% on RA, COVID positive. Admitted for acute respiratory failure with hypoxia and hypercapnia and AKI. HD initiated 01/18/23. Intubated 1/30 and transferred to ICU, self-extubated 1/31. intubated 2/3-2/4 due to respiratory distress. PMH includes chronic diastolic heart failure, DM2, ESRD with renal transplant on tacrolimus, aortic stenosis, OSA On BiPAP.    PT Comments    Patient progressing with mobility needing less help today for sit to stand.  Also walking on RA with SpO2 87-91% and not symptomatic.  Wife present and pleased with his progress.  Issued and educated in [place] for home.  Wife and pt verbalized understanding.  PT will continue to follow.  HHPT remains appropriate.    Recommendations for follow up therapy are one component of a multi-disciplinary discharge planning process, led by the attending physician.  Recommendations may be updated based on patient status, additional functional criteria and insurance authorization.  Follow Up Recommendations  Home health PT     Assistance Recommended at Discharge    Patient can return home with the following Assistance with cooking/housework;Assist for transportation;Help with stairs or ramp for entrance;A lot of help with walking and/or transfers;A lot of help with bathing/dressing/bathroom;Direct supervision/assist for financial management;Direct supervision/assist for medications management   Equipment Recommendations  Rolling walker (2 wheels)    Recommendations for Other Services       Precautions / Restrictions Precautions Precautions: Fall     Mobility  Bed Mobility               General bed mobility comments: Up in chair    Transfers Overall transfer level: Needs  assistance Equipment used: Rolling walker (2 wheels) Transfers: Sit to/from Stand Sit to Stand: Supervision, Min guard           General transfer comment: cues for using armrests on chair, assist initially for balance, then repeated sit to stands with S    Ambulation/Gait Ambulation/Gait assistance: Supervision, Min guard Gait Distance (Feet): 400 Feet Assistive device: Rolling walker (2 wheels) Gait Pattern/deviations: Step-through pattern, Decreased stride length, Trunk flexed       General Gait Details: cues for forward gaze, increased time on turns but without cues; performed some weaving around floor tiles in hallway for more household ambulation   Stairs             Wheelchair Mobility    Modified Rankin (Stroke Patients Only)       Balance Overall balance assessment: Needs assistance Sitting-balance support: Feet supported Sitting balance-Leahy Scale: Good   Postural control: Posterior lean Standing balance support: Single extremity supported, Bilateral upper extremity supported, Reliant on assistive device for balance Standing balance-Leahy Scale: Poor                              Cognition Arousal/Alertness: Awake/alert Behavior During Therapy: WFL for tasks assessed/performed Overall Cognitive Status: Impaired/Different from baseline Area of Impairment: Attention, Memory, Safety/judgement, Following commands                   Current Attention Level: Sustained Memory: Decreased short-term memory Following Commands: Follows one step commands consistently, Follows one step commands with increased time Safety/Judgement: Decreased awareness of deficits, Decreased awareness of safety  Problem Solving: Slow processing General Comments: needing cues for using armrests on chair every time with sit to stand        Exercises General Exercises - Lower Extremity Long Arc Quad: AROM, Strengthening, Both, Seated, 5 reps Hip  ABduction/ADduction: Strengthening, Both, 5 reps, Standing Hip Flexion/Marching: Both, Standing, 5 reps Heel Raises: Strengthening, Both, 5 reps, Standing Other Exercises Other Exercises: sit<>stand x  5 cues for minimal UE use    General Comments General comments (skin integrity, edema, etc.): SpO2 87-91 % wih ambulation on RA; HR max 120; wife present and participated in HEP instruction with pt using handout.      Pertinent Vitals/Pain Pain Assessment Pain Assessment: No/denies pain    Home Living                          Prior Function            PT Goals (current goals can now be found in the care plan section) Progress towards PT goals: Progressing toward goals    Frequency    Min 3X/week      PT Plan Current plan remains appropriate    Co-evaluation              AM-PAC PT "6 Clicks" Mobility   Outcome Measure  Help needed turning from your back to your side while in a flat bed without using bedrails?: A Little Help needed moving from lying on your back to sitting on the side of a flat bed without using bedrails?: A Little Help needed moving to and from a bed to a chair (including a wheelchair)?: A Little Help needed standing up from a chair using your arms (e.g., wheelchair or bedside chair)?: A Little Help needed to walk in hospital room?: A Little Help needed climbing 3-5 steps with a railing? : Total 6 Click Score: 16    End of Session Equipment Utilized During Treatment: Gait belt Activity Tolerance: Patient tolerated treatment well Patient left: in chair;with family/visitor present;with call bell/phone within reach   PT Visit Diagnosis: Unsteadiness on feet (R26.81);Muscle weakness (generalized) (M62.81)     Time: 1110-1145 PT Time Calculation (min) (ACUTE ONLY): 35 min  Charges:  $Gait Training: 8-22 mins $Therapeutic Exercise: 8-22 mins                     [name], PT Acute Rehabilitation  Services [phone] 01/29/2023    [name] 01/29/2023, 12:12 PM

## 2023-01-29 NOTE — Assessment & Plan Note (Addendum)
Treatment with supplemental 02 and systemic corticosteroids.  Patient was treated for 7 days with antibiotic therapy for community acquired pneumonia, present on admission.

## 2023-01-29 NOTE — Progress Notes (Signed)
3L02 bleed in due to pt Sp02 in the low 80's.  Sp02 increased to 94-96%.

## 2023-01-29 NOTE — Progress Notes (Signed)
New Dialysis Start    Patient identified as new dialysis start. Kidney Education packet assembled and given. Discussed the following items with patient and wife:     Current medications and possible changes once started:  Discussed that patient's medications may change over time.  Ex; hypertension medications and diabetes medication.  Nephrologists will adjust as needed.   Fluid restrictions reviewed:  32 oz daily goal:  All liquids count; soups, ice, jello and fruits.   Phosphorus and potassium: Handout given showing high potassium and phosphorus foods.  Alternative food and drink options given.   Family support:  Wife at bedside, very supportive.   Outpatient Clinic Resources:  Discussed roles of Outpatient clinic staff and advised to make a list of needs, if any, to talk with outpatient staff if needed.   Care plan schedule: Informed patient of Care Plans in outpatient setting and to participate in the care plan.  An invitation would be given from outpatient clinic.    Dialysis Access Options:  Reviewed access options with patients. Discussed in detail about care at home with new AVG & AVF. Reviewed checking bruit and thrill. If dialysis catheter present, educated that patient could not take showers.  Catheter dressing changes were to be done by outpatient clinic staff only. (Patient has matured left AVF from previous HD)   Home therapy options:  Educated patient about home therapy options:  PD vs home hemo.     Patient and wife verbalized understanding. Will continue to round on patient during admission.    Aurther Loft Dialysis Nurse Coordinator [phone]

## 2023-01-29 NOTE — Assessment & Plan Note (Signed)
Continue with nutritional supplements.  

## 2023-01-29 NOTE — Progress Notes (Addendum)
Progress Note   Patient: Russell Bowers JKD:[id number] DOB: 02/15/1951 DOA: 01/14/2023     15 DOS: the patient was seen and examined on 01/29/2023   Brief hospital course: Mr. [place] was admitted to the hospital with the working diagnosis of respiratory failure due to SRAS COVID 19 viral pneumonia. Complicated with progressive renal failure, now on renal replacement therapy.   72 year old male with a past medical history significant for renal transplant among multiple other problems was admitted on January 14, 2023 in the context of shortness of breath, hypoxemia from COVID-19. He was admitted by the hospitalist service, has been treated with Solu-Medrol, ceftriaxone and azithromycin. He has had progressive renal failure over the last several months and during this admission started on hemodialysis.  During this admission he's had up to 9 liters of fluid removed via hemodialysis. He was intubated on 1/30 for hypercarbic respiratory failure noted to have some mild bronchomalacia via bronchoscopy.  He self extubated 1/31 early AM, was stable on high flow nasal cannula, moved out of ICU on 2/1. 2/3 PCCM called back due to worsening lethargy. Wife reports that he was up all night 2/2>2/3 AM, was talkative morning of 2/3 but lethargic.  Got phenergan around noon and mental status did not improve.  PCCM consulted for evaluation.  Has not received other sedatives/narcotics today.  1/30 ABG showing hypercapnic RF. BIPAP placed without significant improvement. PCCM consulted for intubation. 1/31 Self-extubated overnight.  2/1 Improved from a respiratory standpoint, HHFNC 55L/FiO2 75%. 2/2 HD again 2/3 progressive lethargy, intubated overnight 2/4 HD, then extubated 2/6 off levo; transferring to progressive   02/07 transfer to [facility].   Assessment and Plan: * COVID-19 Patient was treated for 7 days with antibiotic therapy for community acquired pneumonia, present on admission.    Acute on chronic  diastolic CHF (congestive heart failure) (HCC) Acute hypoxemic and hypercapnic respiratory failure.  Pulmonary edema (acute).  Patient with progressive renal disease, volume is improving with ultrafiltration.   Systolic blood pressure has been 109 to 127 mmHg. Continue with midodrine.   Oxygenation has been improving.   ESRD on dialysis ([place]) Progressed to ESRD from CKD stage 5 Hyponatremia, hypokalemia.  Currently on renal replacement therapy Plan for Monday, Wednesday and Friday scheduled for intermittent HD.   Anemia of chronic renal disease.   Metabolic bone disease contin with calcitriol.   SP renal transplant, continue with tacrolimus.   A-fib ([place]) Rate has been elevated during HD He continue in atrial fibrillation rhythm.  Resume metoprolol 50 mg po bid for rate control atrial fibrillation.  Currently on low dose apixaban, because thrombocytopenia. (71).    Type 2 diabetes mellitus with hyperlipidemia (HCC) Continue glucose cover with insulin, sliding scale and basal.  Continue glucose monitoring.   On statin therapy.   Malnutrition of moderate degree Continue with nutritional supplements.         Subjective: Patient is feeling better, his dyspnea is improving, continue to have lower extremity edema   Physical Exam: Vitals:   01/29/23 1615 01/29/23 1630 01/29/23 1645 01/29/23 1700  BP: 115/70 109/74 115/63 98/72  Pulse: (!) 115 (!) 117 (!) 108 (!) 113  Resp: (!) 23 16 (!) 27 (!) 32  Temp:      TempSrc:      SpO2: 96% 97% 97% 95%  Weight:      Height:       Neurology awake and alert ENT with mild pallor Cardiovascular with S1 and S2 present, irregularly irregular with positive  systolic murmur at the right sternal border Respiratory with no wheezing, rales or rhonchi Abdomen with no distention  Positive lower extremity edema ++ pitting  Data Reviewed:    Family Communication: no family at the bedside   Disposition: Status is:  Inpatient Remains inpatient appropriate because: pending transfer to CIR   Planned Discharge Destination: Rehab      Author: Tawni Millers, MD 01/29/2023 5:15 PM  For on call review [email or web address].

## 2023-01-29 NOTE — Progress Notes (Addendum)
Contacted [facility] admissions and [place] [facility] to inquire if pt can start at clinic on Friday should pt be stable for d/c today or tomorrow. Awaiting response.   Melven Sartorius Renal Navigator [phone]  Addendum at 12:46 pm: Clinic can start pt on Friday if pt stable for d/c today or tomorrow.

## 2023-01-29 NOTE — Plan of Care (Signed)

## 2023-01-29 NOTE — Assessment & Plan Note (Addendum)
Heart rate has improved, plan to continue metoprolol 50 mg po bid for rate control and continue with apixaban for anticoagulation.  Will resume regular dose anticoagulation.   Thrombocytopenia, follow up cell count as outpatient, plt at the time of discharge [phone]

## 2023-01-29 NOTE — Progress Notes (Signed)
[facility] Progress Note Patient Name: KAMDON REISIG DOB: 28-Sep-1951 MRN: [id number]   Date of Service  01/29/2023  HPI/Events of Note  PCCM note from yesterday states that patient has resolved COVID and is off isolation. However, patient remains on airborne and contact isolation.   eICU Interventions  Will D/C airborne and contact isolation.      Intervention Category Major Interventions: Other:  Lysle Dingwall 01/29/2023, 3:46 AM

## 2023-01-30 ENCOUNTER — Other Ambulatory Visit (HOSPITAL_COMMUNITY): Payer: Self-pay

## 2023-01-30 DIAGNOSIS — U071 COVID-19: Secondary | ICD-10-CM | POA: Diagnosis not present

## 2023-01-30 DIAGNOSIS — I5033 Acute on chronic diastolic (congestive) heart failure: Secondary | ICD-10-CM | POA: Diagnosis not present

## 2023-01-30 DIAGNOSIS — N186 End stage renal disease: Secondary | ICD-10-CM | POA: Diagnosis not present

## 2023-01-30 DIAGNOSIS — I480 Paroxysmal atrial fibrillation: Secondary | ICD-10-CM | POA: Diagnosis not present

## 2023-01-30 LAB — PNEUMOCYSTIS PCR: Result Pneumocystis PCR: NEGATIVE

## 2023-01-30 LAB — BASIC METABOLIC PANEL
Anion gap: 14 (ref 5–15)
BUN: 35 mg/dL — ABNORMAL HIGH (ref 8–23)
CO2: 29 mmol/L (ref 22–32)
Calcium: 9.3 mg/dL (ref 8.9–10.3)
Chloride: 90 mmol/L — ABNORMAL LOW (ref 98–111)
Creatinine, Ser: 3.61 mg/dL — ABNORMAL HIGH (ref 0.61–1.24)
GFR, Estimated: 17 mL/min — ABNORMAL LOW (ref >60)
Glucose, Bld: 178 mg/dL — ABNORMAL HIGH (ref 70–99)
Potassium: 3.9 mmol/L (ref 3.5–5.1)
Sodium: 133 mmol/L — ABNORMAL LOW (ref 135–145)

## 2023-01-30 LAB — GLUCOSE, CAPILLARY
Glucose-Capillary: 143 mg/dL — ABNORMAL HIGH (ref 70–99)
Glucose-Capillary: 156 mg/dL — ABNORMAL HIGH (ref 70–99)

## 2023-01-30 MED ORDER — MIDODRINE HCL 10 MG PO TABS
10.0000 mg | ORAL_TABLET | Freq: Three times a day (TID) | ORAL | 0 refills | Status: DC
  Filled 2023-01-30: qty 90, 30d supply, fill #0

## 2023-01-30 MED ORDER — TACROLIMUS 0.5 MG PO CAPS: 2.0000 mg | ORAL_CAPSULE | Freq: Two times a day (BID) | ORAL | Status: AC

## 2023-01-30 MED ORDER — APIXABAN 5 MG PO TABS
5.0000 mg | ORAL_TABLET | Freq: Two times a day (BID) | ORAL | 0 refills | Status: DC
  Filled 2023-01-30: qty 60, 30d supply, fill #0

## 2023-01-30 MED ORDER — NEPRO/CARBSTEADY PO LIQD
237.0000 mL | Freq: Two times a day (BID) | ORAL | 0 refills | Status: AC
  Filled 2023-01-30: qty 14220, 30d supply, fill #0

## 2023-01-30 MED ORDER — RENA-VITE PO TABS
1.0000 | ORAL_TABLET | Freq: Every day | ORAL | 0 refills | Status: AC
  Filled 2023-01-30: qty 30, 30d supply, fill #0

## 2023-01-30 MED ORDER — INSULIN GLARGINE 100 UNIT/ML ~~LOC~~ SOLN: 10.0000 [IU] | Freq: Every day | SUBCUTANEOUS | 11 refills | Status: AC

## 2023-01-30 MED ORDER — METOPROLOL TARTRATE 50 MG PO TABS
50.0000 mg | ORAL_TABLET | Freq: Two times a day (BID) | ORAL | 0 refills | Status: DC
  Filled 2023-01-30: qty 60, 30d supply, fill #0

## 2023-01-30 NOTE — Progress Notes (Signed)
[facility] Progress Note  Assessment/ Plan: AKI on CKD5 progressing to ESRD - b/l creat 4.1 from sept 2023, eGFR 15 ml/min. Creat here 5.2 on admission in the setting of 1 month coughing, poor po intake and newly + here for COVID infection. BP's were low, CT w/o edema, CXR w/o edema. UA +wbcs, renal US unremarkable. Mild pedal edema on initial exam. Viral pna is likely given hypoxia. AKI felt to be intravasc vol depletion vs vol overload vs progression of CKD.  Tried cautious IVF's w/ no improvement in B/Cr or UOP, then tried IV lasix + albumin also w/o good response. B/Cr continue to rise and pt more lethargic and confused c/w uremia. Had 1st HD 1/27 and has required since.  Last HD 2/7, plan next tomorrow outpt.   Appreciate renal navigator finding a MWF spot for him at [address]. Prefers [address] and renal navigator is aware but no spots available.   Suspect he has progressed to ESRD; Dr. Carolin Sicks who has been following the pt as an outpt in clinic for many years agreed as well.   Pulm edema - improved - cont to lower vol w/ HD -- still with edema and need to dial down EDW outpt Renal transplant - done in 2012, w/ function slowly failing. He was CKD stage 5 in sept 2023. Taking only prograf at '2mg'$  am and '2mg'$  pm (pm dose decr from 3) because of high level 9.1 (3.0-8.0). And likely can start to wean off as outpt slowly especially if he's not a future candidate for another transplant. BP - BP's normal to low, started midodrine 10 tid - continue COVID infection - PCCM thinks not COVID PNA; rec'd CAP coverage already; off isolation now Atrial fib - seen by cards DM2 per pmd Anemia - mild - no ESA indication currently Hypokalemia - K 3.9 today - have been using 4K dialysate  OK for d/c from nephrology perspective.   Subjective: no new complaints.  Tolerated HD well yesterday. Wife bedside - thinks he's doing so much better.  Excited for d/c today   Vitals:   01/30/23 0024  01/30/23 0202 01/30/23 0426 01/30/23 0758  BP: (!) 93/50 (!) 90/57 111/65 113/66  Pulse: 86  99 100  Resp: '20 20 20 18  '$ Temp: 98.8 F (37.1 C)  97.6 F (36.4 C) 97.7 F (36.5 C)  TempSrc: Axillary  Oral Oral  SpO2: 95% (!) 89% 92% 94%  Weight:   80.2 kg   Height:        Exam: Gen alert, no distress Chest clear ant Irreg irreg irreg, HR 90s no MRG Abd soft ntnd no mass or ascites +bs Ext mild pretib/ pedal edema Neuro is alert, Ox 3 , nf, no asterixis  LUA AVF +thrill     Home meds include - allopurinol, sensipar 30 qd, rocaltrol 0.25 mcg qd, pepcid, lasix 40 qd, glipizide, insulin glargine, toprol xl 50, pravachol, prograf '2mg'$  am and '3mg'$  pm, prns/ vits/ supps       Date                          Creat               eGFR    2014                         2.1- 2.7    2015  1.5- 2.1    Feb- aug 2022         3.0- 3.5            18- 21    Sept 2022                 3.65                 17 ml/min    Sept 2023                 4.09                 15 ml/min    01/14/23                     5.37                     01/15/23                     5.27                 11 ml/min     CXR 1/23 - IMPRESSION: 1. Reticulonodular opacities bilaterally, which may reflect atypical/viral infection.   CR chest noncont - Lungs/Pleura: Central airways are patent. Bibasilar linear opacities similar to prior exam and likely due to scarring. Small bilateral pleural effusions    CXR 1/27 - marked worsening of bilat effusions and pulm edema    UCr 134, UNa < 10    UA - prot 100, many bact, 0-5 rbc, >50 wbc    Renal US transplant - normal [place], no hydro, nonspecific elevated intrarenal resistive indices.         Recent Labs  Lab 01/27/23 1102 01/28/23 0056 01/28/23 1128 01/29/23 0255 01/30/23 0159  HGB  --  12.3*  --  11.5*  --   ALBUMIN 3.6  --   --  3.1*  --   CALCIUM 9.1  --    < > 9.2 9.3  PHOS 4.9*  --   --  3.7  --   CREATININE 4.70*  --    < > 4.46* 3.61*  K 3.9  --     < > 3.4* 3.9   < > = values in this interval not displayed.    No results for input(s): "IRON", "TIBC", "FERRITIN" in the last 168 hours.  Inpatient medications:  allopurinol  100 mg Oral Daily   apixaban  2.5 mg Oral BID   calcitRIOL  0.25 mcg Oral Daily   docusate sodium  100 mg Oral BID   feeding supplement (NEPRO CARB STEADY)  237 mL Oral BID BM   folic acid  2 mg Oral Daily   insulin aspart  0-15 Units Subcutaneous TID WC   insulin aspart  0-5 Units Subcutaneous QHS   insulin aspart  3 Units Subcutaneous TID WC   insulin glargine-yfgn  10 Units Subcutaneous BID   metoprolol tartrate  50 mg Oral BID   midodrine  10 mg Oral TID WC   multivitamin  1 tablet Oral QHS   polyethylene glycol  17 g Per Tube Daily   pravastatin  10 mg Oral QHS   QUEtiapine  25 mg Oral QHS   sodium chloride flush  3 mL Intravenous Q12H   tacrolimus  2 mg Sublingual BID    sodium chloride Stopped (01/29/23 0035)   sodium chloride Stopped (01/22/23 1549)   sodium chloride Stopped (  01/26/23 1113)   sodium chloride, acetaminophen **OR** acetaminophen, ondansetron (ZOFRAN) IV, sodium chloride flush

## 2023-01-30 NOTE — Progress Notes (Signed)
Occupational Therapy Treatment Patient Details Name: Russell Bowers MRN: [id number] DOB: [birth year] Today's Date: 01/30/2023   History of present illness Pt is a 72 y/o M presenting to ED on 1/23 with cough and SOB with exertion, found to be hypoxic at 78% on RA, COVID positive. Admitted for acute respiratory failure with hypoxia and hypercapnia and AKI. HD initiated 01/18/23. Intubated 1/30 and transferred to ICU, self-extubated 1/31. intubated 2/3-2/4 due to respiratory distress. PMH includes chronic diastolic heart failure, DM2, ESRD with renal transplant on tacrolimus, aortic stenosis, OSA On BiPAP.   OT comments  Patient with good progress toward patient focused goals.  Patient has improved to a generalized supervision level for ADL completion and in room mobility.  Discharge recommendation updated to [facility] OT, and goals advanced to reflect improved functional status.  OT will follow in the acute setting to address deficits listed.  Patient will have the needed supportive assist at home from his spouse.     Recommendations for follow up therapy are one component of a multi-disciplinary discharge planning process, led by the attending physician.  Recommendations may be updated based on patient status, additional functional criteria and insurance authorization.    Follow Up Recommendations  Home health OT     Assistance Recommended at Discharge Intermittent Supervision/Assistance  Patient can return home with the following  A little help with walking and/or transfers;A little help with bathing/dressing/bathroom;Assist for transportation;Assistance with cooking/housework;Direct supervision/assist for medications management   Equipment Recommendations  None recommended by OT    Recommendations for Other Services      Precautions / Restrictions Precautions Precautions: Fall Precaution Comments: watch SpO2 & HR Restrictions Weight Bearing Restrictions: No       Mobility Bed Mobility                     Transfers Overall transfer level: Needs assistance Equipment used: Rolling walker (2 wheels) Transfers: Sit to/from Stand Sit to Stand: Supervision           General transfer comment: cues for using armrests on chair     Balance Overall balance assessment: Needs assistance Sitting-balance support: Feet supported Sitting balance-Leahy Scale: Good     Standing balance support: Reliant on assistive device for balance Standing balance-Leahy Scale: Poor                             ADL either performed or assessed with clinical judgement   ADL       Grooming: Wash/dry hands;Wash/dry face;Set up;Standing               Lower Body Dressing: Supervision/safety;Sit to/from stand   Toilet Transfer: Supervision/safety;Rolling walker (2 wheels);Regular Toilet;Ambulation                  Extremity/Trunk Assessment Upper Extremity Assessment Upper Extremity Assessment: Overall WFL for tasks assessed   Lower Extremity Assessment Lower Extremity Assessment: Defer to PT evaluation   Cervical / Trunk Assessment Cervical / Trunk Assessment: Normal    Vision Patient Visual Report: No change from baseline     Perception Perception Perception: Not tested   Praxis Praxis Praxis: Not tested    Cognition Arousal/Alertness: Awake/alert Behavior During Therapy: WFL for tasks assessed/performed Overall Cognitive Status: Within Functional Limits for tasks assessed                     Current Attention Level: Alternating Memory: Decreased short-term memory  Awareness: Anticipatory Problem Solving: Requires verbal cues          Exercises      Shoulder Instructions       General Comments      Pertinent Vitals/ Pain       Pain Assessment Pain Assessment: No/denies pain                                                          Frequency  Min 2X/week        Progress Toward  Goals  OT Goals(current goals can now be found in the care plan section)  Progress towards OT goals: Progressing toward goals  Acute Rehab OT Goals OT Goal Formulation: With patient Time For Goal Achievement: 02/13/23 Potential to Achieve Goals: Good ADL Goals Pt Will Perform Lower Body Dressing: with modified independence;sit to/from stand Pt Will Transfer to Toilet: with modified independence;ambulating;regular height toilet  Plan Discharge plan needs to be updated    Co-evaluation                 AM-PAC OT "6 Clicks" Daily Activity     Outcome Measure   Help from another person eating meals?: None Help from another person taking care of personal grooming?: A Little Help from another person toileting, which includes using toliet, bedpan, or urinal?: A Little Help from another person bathing (including washing, rinsing, drying)?: A Little Help from another person to put on and taking off regular upper body clothing?: None Help from another person to put on and taking off regular lower body clothing?: A Little 6 Click Score: 20    End of Session Equipment Utilized During Treatment: Rolling walker (2 wheels)  OT Visit Diagnosis: Unsteadiness on feet (R26.81);Other abnormalities of gait and mobility (R26.89);Muscle weakness (generalized) (M62.81)   Activity Tolerance Patient tolerated treatment well   Patient Left in chair;with call bell/phone within reach;with family/visitor present   Nurse Communication Mobility status        Time: 0109-3235 OT Time Calculation (min): 18 min  Charges: OT General Charges $OT Visit: 1 Visit OT Treatments $Self Care/Home Management : 8-22 mins  01/30/2023  RP, OTR/L  Acute Rehabilitation Services  Office:  [phone]   Metta Clines 01/30/2023, 12:00 PM

## 2023-01-30 NOTE — Progress Notes (Addendum)
SATURATION QUALIFICATIONS: (This note is used to comply with regulatory documentation for home oxygen)  Patient Saturations on Room Air at Rest = 94%  Patient Saturations on Room Air while Ambulating = 87%  Patient Saturations on 2 Liters of oxygen while Ambulating = 95%  Please briefly explain why patient needs home oxygen: patients oxygen decreases on room air with ambulation

## 2023-01-30 NOTE — TOC Benefit Eligibility Note (Signed)
Patient Teacher, English as a foreign language completed.    The patient is currently admitted and upon discharge could be taking Eliquis 2.5 mg.  The current 30 day co-pay is $47.00.   The patient is insured through [employer], [place] Patient Advocate Specialist [place] Patient Advocate Team Direct Number: [phone]  Fax: [phone]

## 2023-01-30 NOTE — Progress Notes (Signed)
Per attending, pt will d/c to home today. Contacted [place] and spoke to [place]. Clinic aware pt to d/c today and start at clinic tomorrow. Pt will need to arrive at 11:45 am to complete paperwork prior to treatment. Contacted renal PA regarding clinic's need for orders. Spoke to pt's wife via phone. Wife aware of d/c today and advised pt will need to start at HD clinic tomorrow. Wife advised pt will need to arrive at 11:45 to complete paperwork prior to treatment. Wife voices understanding and agreeable to plan. HD arrangements added to AVS ([place] [facility] on MWF 12:30 chair time).   Melven Sartorius Renal Navigator [phone]

## 2023-01-30 NOTE — Plan of Care (Signed)
Problem: Education: Goal: Knowledge of General Education information will improve Description: Including pain rating scale, medication(s)/side effects and non-pharmacologic comfort measures Outcome: Adequate for Discharge   Problem: Health Behavior/Discharge Planning: Goal: Ability to manage health-related needs will improve Outcome: Adequate for Discharge   Problem: Clinical Measurements: Goal: Ability to maintain clinical measurements within normal limits will improve Outcome: Adequate for Discharge Goal: Will remain free from infection Outcome: Adequate for Discharge Goal: Diagnostic test results will improve Outcome: Adequate for Discharge Goal: Respiratory complications will improve Outcome: Adequate for Discharge Goal: Cardiovascular complication will be avoided Outcome: Adequate for Discharge   Problem: Activity: Goal: Risk for activity intolerance will decrease Outcome: Adequate for Discharge   Problem: Nutrition: Goal: Adequate nutrition will be maintained Outcome: Adequate for Discharge   Problem: Coping: Goal: Level of anxiety will decrease Outcome: Adequate for Discharge   Problem: Elimination: Goal: Will not experience complications related to bowel motility Outcome: Adequate for Discharge Goal: Will not experience complications related to urinary retention Outcome: Adequate for Discharge   Problem: Pain Managment: Goal: General experience of comfort will improve Outcome: Adequate for Discharge   Problem: Safety: Goal: Ability to remain free from injury will improve Outcome: Adequate for Discharge   Problem: Skin Integrity: Goal: Risk for impaired skin integrity will decrease Outcome: Adequate for Discharge   Problem: Education: Goal: Ability to describe self-care measures that may prevent or decrease complications (Diabetes Survival Skills Education) will improve Outcome: Adequate for Discharge Goal: Individualized Educational Video(s) Outcome:  Adequate for Discharge   Problem: Coping: Goal: Ability to adjust to condition or change in health will improve Outcome: Adequate for Discharge   Problem: Fluid Volume: Goal: Ability to maintain a balanced intake and output will improve Outcome: Adequate for Discharge   Problem: Health Behavior/Discharge Planning: Goal: Ability to identify and utilize available resources and services will improve Outcome: Adequate for Discharge Goal: Ability to manage health-related needs will improve Outcome: Adequate for Discharge   Problem: Metabolic: Goal: Ability to maintain appropriate glucose levels will improve Outcome: Adequate for Discharge   Problem: Nutritional: Goal: Maintenance of adequate nutrition will improve Outcome: Adequate for Discharge Goal: Progress toward achieving an optimal weight will improve Outcome: Adequate for Discharge   Problem: Skin Integrity: Goal: Risk for impaired skin integrity will decrease Outcome: Adequate for Discharge   Problem: Tissue Perfusion: Goal: Adequacy of tissue perfusion will improve Outcome: Adequate for Discharge   Problem: Education: Goal: Knowledge of risk factors and measures for prevention of condition will improve Outcome: Adequate for Discharge   Problem: Coping: Goal: Psychosocial and spiritual needs will be supported Outcome: Adequate for Discharge   Problem: Respiratory: Goal: Will maintain a patent airway Outcome: Adequate for Discharge Goal: Complications related to the disease process, condition or treatment will be avoided or minimized Outcome: Adequate for Discharge   Problem: Education: Goal: Knowledge of disease or condition will improve Outcome: Adequate for Discharge Goal: Understanding of medication regimen will improve Outcome: Adequate for Discharge Goal: Individualized Educational Video(s) Outcome: Adequate for Discharge   Problem: Activity: Goal: Ability to tolerate increased activity will  improve Outcome: Adequate for Discharge   Problem: Cardiac: Goal: Ability to achieve and maintain adequate cardiopulmonary perfusion will improve Outcome: Adequate for Discharge   Problem: Health Behavior/Discharge Planning: Goal: Ability to safely manage health-related needs after discharge will improve Outcome: Adequate for Discharge   Problem: Safety: Goal: Non-violent Restraint(s) Outcome: Adequate for Discharge   Problem: Activity: Goal: Ability to tolerate increased activity will improve Outcome: Adequate for  Discharge   Problem: Respiratory: Goal: Ability to maintain a clear airway and adequate ventilation will improve Outcome: Adequate for Discharge   Problem: Role Relationship: Goal: Method of communication will improve Outcome: Adequate for Discharge   Problem: Education: Goal: Knowledge of disease and its progression will improve Outcome: Adequate for Discharge Goal: Individualized Educational Video(s) Outcome: Adequate for Discharge   Problem: Fluid Volume: Goal: Compliance with measures to maintain balanced fluid volume will improve Outcome: Adequate for Discharge   Problem: Health Behavior/Discharge Planning: Goal: Ability to manage health-related needs will improve Outcome: Adequate for Discharge

## 2023-01-30 NOTE — Discharge Summary (Addendum)
Physician Discharge Summary   Patient: Russell Bowers MRN: [id number] DOB: Jun 05, 1951  Admit date:     01/14/2023  Discharge date: 01/30/23  Discharge Physician: Russell Bowers   PCP: Russell Cruel, MD   Recommendations at discharge:    Patient has been placed on metoprolol 50 mg po bid for rate control atrial fibrillation and apixaban for anticoagulation. Follow up cell count in 7 days, to follow up on thrombocytopenia. Patient placed on intermittent renal replacement therapy, hemodialysis, Monday, Wednesday and Friday.  Decreased dose of tacroliums to 2 mg qam and 2 mg q pm due to high levels, follow up as outpatient with nephrology. Supplemental home 02 per Russell Bowers Follow up with Dr Russell Bowers in 7 to 10 days.   Discharge Diagnoses: Principal Problem:   COVID-19 Active Problems:   Acute on chronic diastolic CHF (congestive heart failure) (HCC)   ESRD on dialysis ([place])   A-fib (HCC)   Type 2 diabetes mellitus with hyperlipidemia (HCC)   Malnutrition of moderate degree  Resolved Problems:   * No resolved hospital problems. [facility] Course: Russell Bowers was admitted to the hospital with the working diagnosis of respiratory failure pulmonary edema, progressive renal failure, COVID 19 infection.   72 year old male with a past medical history significant for renal transplant, X3AT, diastolic heart failure, prostate cancer sp radical prostatectomy in 2011. Reported dyspnea for 7 days, along with weight gain and cough. Tested positive for COVID 19 on 12/29/22. On his initial physical examination his blood pressure was 125/87, HR 96, RR 26 and 02 saturation 78%, patient was in respiratory distress and was placed on Bipap, his lungs had no wheezing or rhonchi, heart with S1 and S2 present and rhythmic, positive murmur systolic at the right sternal border, abdomen with no distention, and positive lower extremity edema.   VBG 7.20/ 77/ 19/ 30/ 21% Na 136, K 4,0, Cl 100 bicarbonate  27 glucose 192, bun 81 cr 5,37  BNP 1,704  High sensitive troponin 23 and 22  Wbc 5.9 hgb 14,8 plt 138  Sars covid 19 positive  Urine analysis SG 1,010, 100 protein, > 50 wbc   Chest radiograph with increase markings bilaterally. Chest CT with bilateral ground glass opacities and small bilateral pleural effusions. Solid pulmonary nodule of the posterior left upper lobe measuring 5 mm.  EKG 93 bpm, normal axis, right bundle branch block, atrial fibrillation rhythm with no significant ST segment or T wave changes.   He was treated with Solu-Medrol, ceftriaxone and azithromycin.   01/27 patient with progressive renal failure and required starting renal replacement therapy.   He developed progressive respiratory failure and was intubated on 1/30 for hypercarbic respiratory failure noted to have some mild bronchomalacia via bronchoscopy.    He self extubated 1/31 early AM, was stable on high flow nasal cannula, 55 L with 75% Fi02. moved out of ICU on   2/3 progressive lethargy, intubated overnight. Developed hypotension requiring norepinephrine infusion.  2/4 extubated  2/6 weaned off vasopressors.    02[facility]07 [facility].  02/08 volume status continue to improve with intermittent renal replacement therapy. Plan to continue midodrine for blood pressure support.   Assessment and Plan: * COVID-19 Treated with supplemental 02 and systemic corticosteroids.  Patient was treated for 7 days with antibiotic therapy for community acquired pneumonia, present on admission.    Acute on chronic diastolic CHF (congestive heart failure) (HCC) Acute hypoxemic and hypercapnic respiratory failure.  Pulmonary edema (acute)/ volume overload.  Patient has been on intermittent renal replacement therapy with improvement in his volume status.  Continue midodrine for blood pressure support.   At the time of his discharge his oxygenation is 94% on room air.   ESRD on dialysis ([employer]) Progressed to ESRD  from CKD stage 5 Hyponatremia, hypokalemia.  Currently on renal replacement therapy Plan for Monday, Wednesday and Friday scheduled for intermittent HD.   Anemia of chronic renal disease.   Metabolic bone disease continue with calcitriol and cinacalcet.   SP renal transplant, continue with tacrolimus. Dose decreased to 2 mg in am and 2 mg in pm because a high level 9,1. Can start to wean off as outpatient slowly per nephrology recommendations.    A-fib (HCC) Heart rate has improved, plan to continue metoprolol 50 mg po bid for rate control and continue with apixaban for anticoagulation.  Will resume regular dose anticoagulation.   Thrombocytopenia, follow up cell count as outpatient, plt at the time of discharge 77 [phone]  Type 2 diabetes mellitus with hyperlipidemia ([place]) Patient was placed on insulin therapy for glucose control.  At the time of his discharge will continue basal insulin 10 units.   On statin therapy.   Malnutrition of moderate degree Continue with nutritional supplements.          Consultants: nephrology, critical care Procedures performed: none   Disposition: Home Diet recommendation:  Discharge Diet Orders (From admission, onward)     Start     Ordered   01/30/23 0000  Diet - low sodium heart healthy        01/30/23 1520           Cardiac and Carb modified diet DISCHARGE MEDICATION: Allergies as of 01/30/2023       Reactions   Azathioprine Other (See Comments)   Phenergan [promethazine Hcl] Other (See Comments)   Retention/obtundation, needed intubation   Pollen Extract Other (See Comments)   stuffy nose        Medication List     STOP taking these medications    furosemide 40 MG tablet Commonly known as: LASIX   glipiZIDE 5 MG 24 hr tablet Commonly known as: GLUCOTROL XL   glipiZIDE 5 MG tablet Commonly known as: GLUCOTROL   metoprolol succinate 50 MG 24 hr tablet Commonly known as: TOPROL-XL       TAKE these  medications    allopurinol 100 MG tablet Commonly known as: ZYLOPRIM Take 100 mg by mouth daily.   apixaban 2.5 MG Tabs tablet Commonly known as: ELIQUIS Take 2 tablets (5 mg total) by mouth 2 (two) times daily.   B-D ULTRAFINE III SHORT PEN 31G X 8 MM Misc Generic drug: Insulin Pen Needle Inject into the skin daily.   calcitRIOL 0.25 MCG capsule Commonly known as: ROCALTROL Take 0.25 mcg by mouth daily.   cinacalcet 30 MG tablet Commonly known as: SENSIPAR Take 30 mg by mouth every other day.   cyanocobalamin 500 MCG tablet Commonly known as: VITAMIN B12 Take 1,000 mcg by mouth at bedtime.   docusate sodium 100 MG capsule Commonly known as: COLACE Take 100 mg by mouth daily.   famotidine 20 MG tablet Commonly known as: PEPCID Take 20 mg by mouth daily.   feeding supplement (NEPRO CARB STEADY) Liqd Take 237 mLs by mouth 2 (two) times daily between meals.   folic acid 1 MG tablet Commonly known as: FOLVITE TAKE 2 TABLETS BY MOUTH EVERY DAY   insulin glargine 100 UNIT/ML injection Commonly known as:  LANTUS Inject 0.1 mLs (10 Units total) into the skin at bedtime. What changed: how much to take   loperamide 2 MG capsule Commonly known as: IMODIUM Take 2 mg by mouth as needed for diarrhea or loose stools.   loratadine 10 MG tablet Commonly known as: CLARITIN Take 10 mg by mouth daily.   metoprolol tartrate 50 MG tablet Commonly known as: LOPRESSOR Take 1 tablet (50 mg total) by mouth 2 (two) times daily.   midodrine 10 MG tablet Commonly known as: PROAMATINE Take 1 tablet (10 mg total) by mouth 3 (three) times daily with meals.   multivitamin Tabs tablet Take 1 tablet by mouth at bedtime.   OneTouch Ultra test strip Generic drug: glucose blood 3 (three) times daily.   pravastatin 10 MG tablet Commonly known as: PRAVACHOL Take 10 mg by mouth at bedtime.   SLOW-MAG PO Take 1 tablet by mouth daily.   tacrolimus 0.5 MG capsule Commonly known as:  PROGRAF Place 4 capsules (2 mg total) under the tongue 2 (two) times daily. What changed:  how much to take how to take this when to take this additional instructions               Durable Medical Equipment  (From admission, onward)           Start     Ordered   01/20/23 1232  For home use only DME Walker rolling  Once       Question Answer Comment  Walker: With [place]   Patient needs a walker to treat with the following condition Weakness      01/20/23 [place], [facility]. Go on 01/31/2023.   Why: Schedule is Monday/Wednesday/Friday with 12:30 chair time. on Friday, please arrive at 11:45 to complete paperwork prior to treatment. Contact information: [place] [address] [phone]         Care, [facility] Follow up.   Specialty: Home Health Services Why: [place] to call with visit times Contact information: [address] [place] [address] [phone]                Discharge Exam: Filed Weights   01/28/23 0500 01/29/23 0639 01/30/23 0426  Weight: 79 kg 80.3 kg 80.2 kg   BP 111/68 (BP Location: Right Arm)   Pulse 88   Temp 98.3 F (36.8 C) (Oral)   Resp 20   Ht '5\' 9"'$  (1.753 m)   Wt 80.2 kg   SpO2 91%   BMI 26.11 kg/m   Patient is feeling well, no dyspnea or chest pain, edema lower extremities is improving.   Neurology awake and alert ENT with mild pallor Cardiovascular with S1 and S2 present, irregularly irregular with positive murmur systolic at the lower right sternal border, no gallops or rubs Respiratory with no rales or wheezing Abdomen with no distention  Positive lower extremity edema at the ankles ++   Condition at discharge: stable  The results of significant diagnostics from this hospitalization (including imaging, microbiology, ancillary and laboratory) are listed below for  reference.   Imaging Studies: DG CHEST PORT 1 VIEW  Result Date: 01/25/2023 CLINICAL DATA:  Endotracheal tube. Respiratory failure with hypoxia. EXAM: PORTABLE CHEST 1 VIEW COMPARISON:  01/25/2023. FINDINGS: The heart size and mediastinal contours are stable. The pulmonary vasculature is distended. Hazy  opacities are noted in the lungs bilaterally. Lung volumes are low. Small bilateral pleural effusions, greater on the left than on the right no pneumothorax. The endotracheal tube terminates 4.1 cm above the carina. IMPRESSION: 1. Cardiomegaly with pulmonary vascular congestion. 2. Hazy airspace disease in the lungs bilaterally, slightly improved from the prior exam. 3. Small bilateral pleural effusions. Electronically Signed   By: Brett Fairy M.D.   On: 01/25/2023 22:46   DG CHEST PORT 1 VIEW  Result Date: 01/25/2023 CLINICAL DATA:  Shortness of breath EXAM: PORTABLE CHEST 1 VIEW COMPARISON:  01/25/2023 FINDINGS: Heart is normal size. Mediastinal contours within normal limits. Diffuse bilateral airspace disease. Layering bilateral pleural effusions. Airspace opacities slightly improved on the left. No acute bony abnormality. IMPRESSION: Diffuse bilateral airspace disease, improving on the left since prior study. Layering bilateral pleural effusions. Findings could reflect edema or infection. Electronically Signed   By: Rolm Baptise M.D.   On: 01/25/2023 20:30   DG CHEST PORT 1 VIEW  Result Date: 01/25/2023 CLINICAL DATA:  275170 Respiratory abnormalities [id number] EXAM: PORTABLE CHEST 1 VIEW COMPARISON:  01/21/2023 FINDINGS: Cardiomegaly. Diffuse bilateral airspace disease. Suspect layering effusions. Low lung volumes. No acute bony abnormality. IMPRESSION: Diffuse bilateral airspace disease with probable layering effusions. Favor edema although infection not excluded. Electronically Signed   By: Rolm Baptise M.D.   On: 01/25/2023 19:26   DG Chest Port 1 View  Result Date: 01/21/2023 CLINICAL DATA:   Respiratory failure EXAM: PORTABLE CHEST 1 VIEW COMPARISON:  Chest x-ray dated January 21, 2023 FINDINGS: ETT tip is 4 cm from the carina. NG/OG tube courses below the diaphragm with tip and side port overlying the expected area of the stomach. Cardiac and mediastinal contours are unchanged. Small to moderate bilateral pleural effusions and bibasilar atelectasis, similar to prior. No evidence of pneumothorax. IMPRESSION: 1. ETT tip is 4 cm from the carina. 2. NG/OG tube courses below the diaphragm with tip and side port overlying the expected area of the stomach. Electronically Signed   By: Yetta Glassman M.D.   On: 01/21/2023 17:28   DG Chest Port 1 View  Result Date: 01/21/2023 CLINICAL DATA:  Confusion.  Cough. EXAM: PORTABLE CHEST 1 VIEW COMPARISON:  CXR 01/18/23 FINDINGS: Cardiomegaly. Persistent bibasilar pulmonary opacities nonspecific, and could represent a combination of pleural effusion and superimposed atelectasis or infection. There are air bronchograms of the right lower lobe, which raises the possibility for infection. There is marked gastric gaseous distention, correlate for symptoms of nausea and vomiting. IMPRESSION: 1. Persistent bibasilar pulmonary opacities nonspecific, and could represent a combination of pleural effusion and superimposed atelectasis. There are air bronchograms in the right lower lobe, which raises the possibility for infection. 2. Cardiomegaly. 3. There is marked gastric gaseous distention, correlate for symptoms of nausea and vomiting. Electronically Signed   By: Marin Roberts M.D.   On: 01/21/2023 08:07   VAS [place] LOWER EXTREMITY VENOUS (DVT)  Result Date: 01/19/2023  Lower Venous DVT Study Patient Name:  ETHER WOLTERS  Date of Exam:   01/18/2023 Medical Rec #: [id number]           Accession #:    [id number] Date of Birth: 1951/09/27            Patient Gender: M Patient Age:   24 years Exam Location:  [facility] Procedure:      VAS [place] LOWER EXTREMITY VENOUS  (DVT) Referring Phys: [facility] --------------------------------------------------------------------------------  Indications: Elevated ddimer.  Comparison Study: no  prior Performing Technologist: Archie Patten RVS  Examination Guidelines: A complete evaluation includes B-mode imaging, spectral Doppler, color Doppler, and power Doppler as needed of all accessible portions of each vessel. Bilateral testing is considered an integral part of a complete examination. Limited examinations for reoccurring indications may be performed as noted. The reflux portion of the exam is performed with the patient in reverse Trendelenburg.  +---------+---------------+---------+-----------+----------+--------------+ RIGHT    CompressibilityPhasicitySpontaneityPropertiesThrombus Aging +---------+---------------+---------+-----------+----------+--------------+ CFV      Full           Yes      Yes                                 +---------+---------------+---------+-----------+----------+--------------+ SFJ      Full                                                        +---------+---------------+---------+-----------+----------+--------------+ FV Prox  Full                                                        +---------+---------------+---------+-----------+----------+--------------+ FV Mid   Full                                                        +---------+---------------+---------+-----------+----------+--------------+ FV DistalFull                                                        +---------+---------------+---------+-----------+----------+--------------+ PFV      Full                                                        +---------+---------------+---------+-----------+----------+--------------+ POP      Full           Yes      Yes                                 +---------+---------------+---------+-----------+----------+--------------+ PTV      Full                                                         +---------+---------------+---------+-----------+----------+--------------+ PERO     Full                                                        +---------+---------------+---------+-----------+----------+--------------+   +---------+---------------+---------+-----------+----------+-------------------+  LEFT     CompressibilityPhasicitySpontaneityPropertiesThrombus Aging      +---------+---------------+---------+-----------+----------+-------------------+ CFV      Full           Yes      Yes                                      +---------+---------------+---------+-----------+----------+-------------------+ SFJ      Full                                                             +---------+---------------+---------+-----------+----------+-------------------+ FV Prox  Full                                                             +---------+---------------+---------+-----------+----------+-------------------+ FV Mid   Full                                                             +---------+---------------+---------+-----------+----------+-------------------+ FV DistalFull                                                             +---------+---------------+---------+-----------+----------+-------------------+ PFV      Full                                                             +---------+---------------+---------+-----------+----------+-------------------+ POP      Full           Yes      Yes                                      +---------+---------------+---------+-----------+----------+-------------------+ PTV      Full                                                             +---------+---------------+---------+-----------+----------+-------------------+ PERO                                                  Not well visualized  +---------+---------------+---------+-----------+----------+-------------------+     Summary: BILATERAL: - No evidence of  deep vein thrombosis seen in the lower extremities, bilaterally. -No evidence of popliteal cyst, bilaterally.   *See table(s) above for measurements and observations. Electronically signed by Servando Snare MD on 01/19/2023 at 10:16:55 AM.    Final    DG Chest Port 1 View  Result Date: 01/18/2023 CLINICAL DATA:  Shortness of breath EXAM: PORTABLE CHEST 1 VIEW COMPARISON:  None Available. FINDINGS: Cardiomegaly. The hila and mediastinum are normal. Diffuse interstitial opacities in the lungs have worsened. Bilateral pleural effusions with underlying atelectasis are again identified. No other acute abnormalities. IMPRESSION: Findings are most consistent with cardiomegaly, pleural effusions, and worsening pulmonary edema. Electronically Signed   By: Dorise Bullion III M.D.   On: 01/18/2023 12:12   DG CHEST PORT 1 VIEW  Result Date: 01/16/2023 CLINICAL DATA:  Acute renal failure superimposed on chronic kidney disease EXAM: PORTABLE CHEST 1 VIEW COMPARISON:  Multiple exams, including 01/14/2023 FINDINGS: Low lung volumes are present, causing crowding of the pulmonary vasculature. There is a small amount of fluid in the minor fissure along with blunting of both costophrenic angles compatible with small bilateral pleural effusions. Indistinct pulmonary vasculature compatible with pulmonary venous hypertension. Cardiac borders partially obscured but I anticipate that there is mild enlargement of the cardiopericardial silhouette. IMPRESSION: 1. Mild enlargement of the cardiopericardial silhouette with pulmonary venous hypertension and small bilateral pleural effusions. Findings favor congestive heart failure. 2. Low lung volumes. 3. No findings of pneumonia. Electronically Signed   By: Van Clines M.D.   On: 01/16/2023 10:06   ECHOCARDIOGRAM COMPLETE  Result Date: 01/15/2023     ECHOCARDIOGRAM REPORT   Patient Name:   EYOEL THROGMORTON Date of Exam: 01/15/2023 Medical Rec #:  [id number]          Height:       69.0 in Accession #:    [id number]         Weight:       183.0 lb Date of Birth:  08/11/1951           BSA:          1.989 m Patient Age:    61 years           BP:           132/68 mmHg Patient Gender: M                  HR:           91 bpm. Exam Location:  Inpatient Procedure: 2D Echo, Cardiac Doppler, Color Doppler and Intracardiac            Opacification Agent Indications:    Atrial fibrillation  History:        Patient has prior history of Echocardiogram examinations, most                 recent 02/18/2021. CHF, Aortic Valve Disease,                 Signs/Symptoms:Shortness of Breath; Risk Factors:Sleep Apnea,                 Hypertension and Diabetes. Cancer.  Sonographer:    Eartha Inch Referring Phys: 1950 ANASTASSIA DOUTOVA  Sonographer Comments: Technically difficult study due to poor echo windows. Image acquisition challenging due to patient body habitus and Image acquisition challenging due to respiratory motion. IMPRESSIONS  1. Left ventricular ejection fraction, by estimation, is 60 to 65%. The left ventricle has normal function. The left ventricle has no  regional wall motion abnormalities. Left ventricular diastolic parameters are indeterminate.  2. Right ventricular systolic function is moderately reduced. The right ventricular size is normal.  3. Left atrial size was moderately dilated.  4. The mitral valve is abnormal. No evidence of mitral valve regurgitation. No evidence of mitral stenosis. Moderate mitral annular calcification.  5. Fucntionally bicuspid with fuse right and left cusps gradietns similar to those obtained 02/18/21 . The aortic valve is normal in structure. There is severe calcifcation of the aortic valve. There is severe thickening of the aortic valve. Aortic valve  regurgitation is trivial. Mild to moderate aortic valve stenosis.  6. The inferior  vena cava is normal in size with greater than 50% respiratory variability, suggesting right atrial pressure of 3 mmHg. FINDINGS  Left Ventricle: Left ventricular ejection fraction, by estimation, is 60 to 65%. The left ventricle has normal function. The left ventricle has no regional wall motion abnormalities. Definity contrast agent was given IV to delineate the left ventricular  endocardial borders. The left ventricular internal cavity size was normal in size. There is no left ventricular hypertrophy. Left ventricular diastolic parameters are indeterminate. Right Ventricle: The right ventricular size is normal. No increase in right ventricular wall thickness. Right ventricular systolic function is moderately reduced. Left Atrium: Left atrial size was moderately dilated. Right Atrium: Right atrial size was normal in size. Pericardium: Trivial pericardial effusion is present. The pericardial effusion is posterior to the left ventricle. Mitral Valve: The mitral valve is abnormal. There is moderate thickening of the mitral valve leaflet(s). There is moderate calcification of the mitral valve leaflet(s). Moderate mitral annular calcification. No evidence of mitral valve regurgitation. No evidence of mitral valve stenosis. Tricuspid Valve: The tricuspid valve is normal in structure. Tricuspid valve regurgitation is mild . No evidence of tricuspid stenosis. Aortic Valve: Fucntionally bicuspid with fuse right and left cusps gradietns similar to those obtained 02/18/21. The aortic valve is normal in structure. There is severe calcifcation of the aortic valve. There is severe thickening of the aortic valve. Aortic valve regurgitation is trivial. Mild to moderate aortic stenosis is present. Aortic valve mean gradient measures 17.0 mmHg. Aortic valve peak gradient measures 27.9 mmHg. Aortic valve area, by VTI measures 1.64 cm. Pulmonic Valve: The pulmonic valve was normal in structure. Pulmonic valve regurgitation is not  visualized. No evidence of pulmonic stenosis. Aorta: The aortic root is normal in size and structure. Venous: The inferior vena cava is normal in size with greater than 50% respiratory variability, suggesting right atrial pressure of 3 mmHg. IAS/Shunts: No atrial level shunt detected by color flow Doppler.  LEFT VENTRICLE PLAX 2D LVIDd:         4.30 cm   Diastology LVIDs:         2.60 cm   LV e' medial:    3.81 cm/s LV PW:         0.80 cm   LV E/e' medial:  34.9 LV IVS:        1.10 cm   LV e' lateral:   7.15 cm/s LVOT diam:     2.20 cm   LV E/e' lateral: 18.6 LV SV:         70 LV SV Index:   35 LVOT Area:     3.80 cm  RIGHT VENTRICLE RV S prime:     11.50 cm/s TAPSE (M-mode): 1.4 cm LEFT ATRIUM             Index  RIGHT ATRIUM           Index LA diam:        4.90 cm 2.46 cm/m   RA Area:     23.10 cm LA Vol (A2C):   86.0 ml 43.23 ml/m  RA Volume:   73.60 ml  37.00 ml/m LA Vol (A4C):   52.1 ml 26.19 ml/m LA Biplane Vol: 70.3 ml 35.34 ml/m  AORTIC VALVE AV Area (Vmax):    1.50 cm AV Area (Vmean):   1.40 cm AV Area (VTI):     1.64 cm AV Vmax:           264.00 cm/s AV Vmean:          194.000 cm/s AV VTI:            0.428 m AV Peak Grad:      27.9 mmHg AV Mean Grad:      17.0 mmHg LVOT Vmax:         104.00 cm/s LVOT Vmean:        71.300 cm/s LVOT VTI:          0.185 m LVOT/AV VTI ratio: 0.43  AORTA Ao Root diam: 3.50 cm Ao Asc diam:  3.80 cm MITRAL VALVE                TRICUSPID VALVE MV Area (PHT): 3.34 cm     TR Peak grad:   17.8 mmHg MV Decel Time: 227 msec     TR Vmax:        211.00 cm/s MV E velocity: 133.00 cm/s                             SHUNTS                             Systemic VTI:  0.18 m                             Systemic Diam: 2.20 cm Jenkins Rouge MD Electronically signed by Jenkins Rouge MD Signature Date/Time: 01/15/2023/4:02:46 PM    Final    US Renal Transplant w/Doppler  Result Date: 01/15/2023 CLINICAL DATA:  Acute kidney injury, post transplant EXAM: ULTRASOUND OF RENAL TRANSPLANT  WITH RENAL DOPPLER ULTRASOUND TECHNIQUE: Ultrasound examination of the renal transplant was performed with gray-scale, color and duplex doppler evaluation. COMPARISON:  08/25/2022 FINDINGS: Transplant kidney location: RLQ Transplant Kidney: Renal measurements: 8 x 3.8 x 4.8 cm = volume: 54m (previously 99). Normal in size and parenchymal echogenicity. No evidence of mass or hydronephrosis. No peri-transplant fluid collection seen. Color flow in the main renal artery:  Yes Color flow in the main renal vein:  Yes Duplex Doppler Evaluation: Main Renal Artery Velocity: 47 cm/sec Main Renal Artery Resistive Index: 0.93 Venous waveform in main renal vein:  Present Intrarenal resistive index in upper pole:  0.83 (normal 0.6-0.8; equivocal 0.8-0.9; abnormal >= 0.9) Intrarenal resistive index in lower pole: 0.87 (normal 0.6-0.8; equivocal 0.8-0.9; abnormal >= 0.9) Bladder: Normal for degree of bladder distention. No ureteral jet identified. Other findings:  None. IMPRESSION: 1. Nonspecific elevated intrarenal resistive indices. 2. No hydronephrosis. Electronically Signed   By: DLucrezia EuropeM.D.   On: 01/15/2023 08:07   [place]ous Img Lower Unilateral Right  Result Date: 01/14/2023 CLINICAL DATA:  Pain and swelling EXAM: Right LOWER EXTREMITY  VENOUS DOPPLER ULTRASOUND TECHNIQUE: Gray-scale sonography with compression, as well as color and duplex ultrasound, were performed to evaluate the deep venous system(s) from the level of the common femoral vein through the popliteal and proximal calf veins. COMPARISON:  None Available. FINDINGS: VENOUS Normal compressibility of the common femoral, superficial femoral, and popliteal veins, as well as the visualized calf veins. Visualized portions of profunda femoral vein and great saphenous vein unremarkable. No filling defects to suggest DVT on grayscale or color Doppler imaging. Doppler waveforms show normal direction of venous flow, normal respiratory plasticity and response to  augmentation. Limited views of the contralateral common femoral vein are unremarkable. OTHER None. Limitations: none IMPRESSION: Negative. Electronically Signed   By: Donavan Foil M.D.   On: 01/14/2023 15:27   CT Chest Wo Contrast  Result Date: 01/14/2023 CLINICAL DATA:  Hypoxia and CHF EXAM: CT CHEST WITHOUT CONTRAST TECHNIQUE: Multidetector CT imaging of the chest was performed following the standard protocol without IV contrast. RADIATION DOSE REDUCTION: This exam was performed according to the departmental dose-optimization program which includes automated exposure control, adjustment of the mA and/or kV according to patient size and/or use of iterative reconstruction technique. COMPARISON:  Chest CT dated March 21, 2014 FINDINGS: Cardiovascular: Cardiomegaly. Pericardial effusion. Normal caliber thoracic aorta with moderate calcified plaque. Severe left main and three-vessel coronary artery calcifications. Aortic valve calcifications. Dilated main and distal pulmonary arteries, main pulmonary artery measures up to 4.0 cm. Mediastinum/Nodes: Esophagus and thyroid are unremarkable. No pathologically enlarged lymph nodes seen in the chest. Lungs/Pleura: Central airways are patent. Bibasilar linear opacities similar to prior exam and likely due to scarring. Small bilateral pleural effusions. Solid pulmonary nodule of the posterior left upper lobe measuring 5 mm on series 4, image 28, not definitely present on prior exam although differences in slice thickness limit evaluation Upper Abdomen: Gallstones. Small volume ascites of the partially visualized abdomen. Musculoskeletal: No chest wall mass or suspicious bone lesions identified. IMPRESSION: 1. Small bilateral pleural effusions and small volume ascites of the partially visualized abdomen. 2. Dilated main and distal pulmonary arteries, findings can be seen in the setting of pulmonary hypertension. 3. Solid pulmonary nodule of the posterior left upper lobe  measuring 5 mm. No follow-up needed if patient is low-risk.This recommendation follows the consensus statement: Guidelines for Management of Incidental Pulmonary Nodules Detected on CT Images: From the Fleischner Society 2017; Radiology 2017; 284:228-243. 4. Cardiomegaly and aortic Atherosclerosis (ICD10-I70.0). Electronically Signed   By: Yetta Glassman M.D.   On: 01/14/2023 13:35   DG Chest Portable 1 View  Result Date: 01/14/2023 CLINICAL DATA:  Hypoxia EXAM: PORTABLE CHEST 1 VIEW COMPARISON:  02/19/2021 FINDINGS: The heart size and mediastinal contours are within normal limits. Low lung volumes with bibasilar atelectasis. Blunting of the bilateral costophrenic angles for which trace pleural effusions are not excluded. Reticulonodular opacities bilaterally. No pneumothorax. The visualized skeletal structures are unremarkable. IMPRESSION: 1. Reticulonodular opacities bilaterally, which may reflect atypical/viral infection. 2. Low lung volumes with bibasilar atelectasis. Electronically Signed   By: Davina Poke D.O.   On: 01/14/2023 12:41    Microbiology: Results for orders placed or performed during the hospital encounter of 01/14/23  Resp panel by RT-PCR (RSV, Flu A&B, Covid) Anterior Nasal Swab     Status: Abnormal   Collection Time: 01/14/23  1:33 PM   Specimen: Anterior Nasal Swab  Result Value Ref Range Status   SARS Coronavirus 2 by RT PCR POSITIVE (A) NEGATIVE Final    Comment: (NOTE) SARS-CoV-2  target nucleic acids are DETECTED.  The SARS-CoV-2 RNA is generally detectable in upper respiratory specimens during the acute phase of infection. Positive results are indicative of the presence of the identified virus, but do not rule out bacterial infection or co-infection with other pathogens not detected by the test. Clinical correlation with patient history and other diagnostic information is necessary to determine patient infection status. The expected result is Negative.  Fact  Sheet for Patients: EntrepreneurPulse.com.au  Fact Sheet for Healthcare Providers: IncredibleEmployment.be  This test is not yet approved or cleared by the [place] FDA and  has been authorized for detection and/or diagnosis of SARS-CoV-2 by FDA under an Emergency Use Authorization (EUA).  This EUA will remain in effect (meaning this test can be used) for the duration of  the COVID-19 declaration under Section 564(b)(1) of the A ct, 21 U.S.C. section 360bbb-3(b)(1), unless the authorization is terminated or revoked sooner.     Influenza A by PCR NEGATIVE NEGATIVE Final   Influenza B by PCR NEGATIVE NEGATIVE Final    Comment: (NOTE) The Xpert Xpress SARS-CoV-2/FLU/RSV plus assay is intended as an aid in the diagnosis of influenza from Nasopharyngeal swab specimens and should not be used as a sole basis for treatment. Nasal washings and aspirates are unacceptable for Xpert Xpress SARS-CoV-2/FLU/RSV testing.  Fact Sheet for Patients: EntrepreneurPulse.com.au  Fact Sheet for Healthcare Providers: IncredibleEmployment.be  This test is not yet approved or cleared by the [place] FDA and has been authorized for detection and/or diagnosis of SARS-CoV-2 by FDA under an Emergency Use Authorization (EUA). This EUA will remain in effect (meaning this test can be used) for the duration of the COVID-19 declaration under Section 564(b)(1) of the Act, 21 U.S.C. section 360bbb-3(b)(1), unless the authorization is terminated or revoked.     Resp Syncytial Virus by PCR NEGATIVE NEGATIVE Final    Comment: (NOTE) Fact Sheet for Patients: EntrepreneurPulse.com.au  Fact Sheet for Healthcare Providers: IncredibleEmployment.be  This test is not yet approved or cleared by the [place] FDA and has been authorized for detection and/or diagnosis of SARS-CoV-2 by FDA under an  Emergency Use Authorization (EUA). This EUA will remain in effect (meaning this test can be used) for the duration of the COVID-19 declaration under Section 564(b)(1) of the Act, 21 U.S.C. section 360bbb-3(b)(1), unless the authorization is terminated or revoked.  Performed at [facility], [address], [place], [place] [address]   Blood culture (routine x 2)     Status: None   Collection Time: 01/14/23  1:47 PM   Specimen: BLOOD  Result Value Ref Range Status   Specimen Description   Final    BLOOD BLOOD RIGHT FOREARM Performed at [facility], [address], [place], [place] [address]    Special Requests   Final    Blood Culture adequate volume BOTTLES DRAWN AEROBIC ONLY Performed at [facility], [address], [place], [place] [address]    Culture   Final    NO GROWTH 5 DAYS Performed at [facility], [place] [address] [place], [place] [address]    Report Status 01/19/2023 FINAL  Final  Blood culture (routine x 2)     Status: None   Collection Time: 01/14/23  1:49 PM   Specimen: BLOOD  Result Value Ref Range Status   Specimen Description   Final    BLOOD RIGHT ANTECUBITAL Performed at [facility], [address], [place], [place] [address]    Special  Requests   Final    Blood Culture adequate volume BOTTLES DRAWN AEROBIC AND ANAEROBIC Performed at [facility], [address] [place], [place] [address]    Culture   Final    NO GROWTH 5 DAYS Performed at [facility], [place] [address] [place], [place] [address]    Report Status 01/19/2023 FINAL  Final  MRSA Next Gen by PCR, Nasal     Status: None   Collection Time: 01/15/23 12:46 AM   Specimen: Nasal Mucosa; Nasal Swab  Result Value Ref Range Status   MRSA by PCR Next Gen NOT DETECTED NOT DETECTED Final    Comment: (NOTE) The GeneXpert MRSA Assay (FDA approved for NASAL specimens only), is one  component of a comprehensive MRSA colonization surveillance program. It is not intended to diagnose MRSA infection nor to guide or monitor treatment for MRSA infections. Test performance is not FDA approved in patients less than 74 years old. Performed at [facility], [place] [address] [place], [place] [address]   Urine Culture     Status: None   Collection Time: 01/15/23  6:34 AM   Specimen: Urine, Clean Catch  Result Value Ref Range Status   Specimen Description   Final    URINE, CLEAN CATCH Performed at [facility], [place] [address] [place], [place] [address]    Special Requests   Final    NONE Performed at [facility], [place] [address] [place], [place] [address]    Culture   Final    NO GROWTH Performed at [facility], [place] [address], [place], [place] [address]    Report Status 01/16/2023 FINAL  Final  Culture, Respiratory w Gram Stain     Status: None   Collection Time: 01/21/23  4:43 PM   Specimen: Bronchoalveolar Lavage; Respiratory  Result Value Ref Range Status   Specimen Description BRONCHIAL ALVEOLAR LAVAGE  Final   Special Requests NONE  Final   Gram Stain NO WBC SEEN NO ORGANISMS SEEN   Final   Culture   Final    NO GROWTH 2 DAYS Performed at [facility], [address] [place], [place] [address]    Report Status 01/24/2023 FINAL  Final  Fungus Culture With Stain     Status: None (Preliminary result)   Collection Time: 01/21/23  4:43 PM   Specimen: Bronchial Alveolar Lavage  Result Value Ref Range Status   Fungus Stain Final report  Final    Comment: (NOTE) Performed At: [facility] [place], [place] [id number] Rush Farmer MD [id number]    Fungus (Mycology) Culture PENDING  Incomplete   Fungal Source BRONCHIAL ALVEOLAR LAVAGE  Final    Comment: Performed at [facility], [place] [address] [place], [place] [address]  Acid Fast Smear (AFB)     Status:  None   Collection Time: 01/21/23  4:43 PM   Specimen: Bronchoalveolar Lavage; Respiratory  Result Value Ref Range Status   AFB Specimen Processing Concentration  Final   Acid Fast Smear Negative  Final    Comment: (NOTE) Performed At: [facility] [place], [place] [id number] Rush Farmer MD [id number]    Source (AFB) BRONCHIAL ALVEOLAR LAVAGE  Final    Comment: Performed at [facility], [place] [address] [place],  [address]  Aspergillus Ag, BAL/Serum     Status: None   Collection Time: 01/21/23  4:43 PM  Specimen: Bronchial Alveolar Lavage  Result Value Ref Range Status   Aspergillus Ag, BAL/Serum 0.14 0.00 - 0.49 Index Final    Comment: (NOTE) Performed At: [facility] [place], [place] [id number] Rush Farmer MD [id number]   Fungus Culture Result     Status: None   Collection Time: 01/21/23  4:43 PM  Result Value Ref Range Status   Result 1 Comment  Final    Comment: (NOTE) KOH/Calcofluor preparation:  no fungus observed. Performed At: [facility] [place], [place] [id number] Rush Farmer MD [id number]   Resp panel by RT-PCR (RSV, Flu A&B, Covid) Anterior Nasal Swab     Status: None   Collection Time: 01/26/23  5:50 PM   Specimen: Anterior Nasal Swab  Result Value Ref Range Status   SARS Coronavirus 2 by RT PCR NEGATIVE NEGATIVE Final   Influenza A by PCR NEGATIVE NEGATIVE Final   Influenza B by PCR NEGATIVE NEGATIVE Final    Comment: (NOTE) The Xpert Xpress SARS-CoV-2/FLU/RSV plus assay is intended as an aid in the diagnosis of influenza from Nasopharyngeal swab specimens and should not be used as a sole basis for treatment. Nasal washings and aspirates are unacceptable for Xpert Xpress SARS-CoV-2/FLU/RSV testing.  Fact Sheet for Patients: EntrepreneurPulse.com.au  Fact Sheet for Healthcare Providers: IncredibleEmployment.be  This  test is not yet approved or cleared by the [place] FDA and has been authorized for detection and/or diagnosis of SARS-CoV-2 by FDA under an Emergency Use Authorization (EUA). This EUA will remain in effect (meaning this test can be used) for the duration of the COVID-19 declaration under Section 564(b)(1) of the Act, 21 U.S.C. section 360bbb-3(b)(1), unless the authorization is terminated or revoked.     Resp Syncytial Virus by PCR NEGATIVE NEGATIVE Final    Comment: (NOTE) Fact Sheet for Patients: EntrepreneurPulse.com.au  Fact Sheet for Healthcare Providers: IncredibleEmployment.be  This test is not yet approved or cleared by the [place] FDA and has been authorized for detection and/or diagnosis of SARS-CoV-2 by FDA under an Emergency Use Authorization (EUA). This EUA will remain in effect (meaning this test can be used) for the duration of the COVID-19 declaration under Section 564(b)(1) of the Act, 21 U.S.C. section 360bbb-3(b)(1), unless the authorization is terminated or revoked.  Performed at [facility], [place] [address], [place], [place] [address]     Labs: CBC: Recent Labs  Lab 01/25/23 0055 01/25/23 2023 01/26/23 0241 01/27/23 0543 01/27/23 0838 01/28/23 0056 01/29/23 0255  WBC 8.2  --  10.1 9.8  --  9.1 8.6  NEUTROABS 6.9  --  8.4* 7.8*  --  7.1 6.8  HGB 12.9*   < > 12.9* 12.1* 13.3 12.3* 11.5*  HCT 40.5   < > 40.7 39.8 39.0 39.4 36.5*  MCV 100.0  --  100.2* 105.3*  --  101.3* 98.9  PLT 58*  --  83* 64*  --  79* 71*   < > = values in this interval not displayed.   Basic Metabolic Panel: Recent Labs  Lab 01/24/23 0920 01/25/23 2023 01/26/23 0241 01/27/23 0838 01/27/23 1102 01/28/23 1128 01/29/23 0255 01/30/23 0159  NA 134*   < > 134* 133* 132* 134* 131* 133*  K 3.5   < > 3.9 3.9 3.9 4.2 3.4* 3.9  CL 96*  --  96*  --  91* 91* 93* 90*  CO2 25  --  23  --  '26 30 27 29  '$ GLUCOSE 290*  --  225*   --  332* 193* 148* 178*  BUN 53*  --  36*  --  45* 32* 46* 35*  CREATININE 4.98*  --  3.79*  --  4.70* 3.76* 4.46* 3.61*  CALCIUM 9.1  --  8.6*  --  9.1 9.6 9.2 9.3  MG  --   --   --   --   --   --  1.8  --   PHOS 4.0  --   --   --  4.9*  --  3.7  --    < > = values in this interval not displayed.   Liver Function Tests: Recent Labs  Lab 01/24/23 0920 01/27/23 1102 01/29/23 0255  ALBUMIN 3.7 3.6 3.1*   CBG: Recent Labs  Lab 01/29/23 1153 01/29/23 1550 01/29/23 2156 01/30/23 0806 01/30/23 1131  GLUCAP 109* 93 257* 143* 156*    Discharge time spent: greater than 30 minutes.  Signed: Tawni Millers, MD [facility] 01/30/2023

## 2023-01-30 NOTE — Progress Notes (Signed)
Mobility Specialist - Progress Note   01/30/23 1400  Mobility  Activity Ambulated with assistance in hallway  Level of Assistance Standby assist, set-up cues, supervision of patient - no hands on  Assistive Device Front wheel walker  Distance Ambulated (ft) 400 ft  Activity Response Tolerated well  Mobility Referral Yes  $Mobility charge 1 Mobility    Pt received in recliner and agreeable. No complaints, encouraged PLB throughout. Left in recliner w/ call bell in reach.   Pre-mobility: SpO2 95% During mobility: SpO2 89%  Post-mobility: SpO2 92%  [place] Specialist Please contact via Solicitor or Rehab office at [phone]

## 2023-01-30 NOTE — TOC Transition Note (Signed)
Transition of Care [facility]) - CM/SW Discharge Note   Patient Details  Name: Russell Bowers MRN: [id number] Date of Birth: Oct 12, 1951  Transition of Care [facility]) CM/SW Contact:  Bethena Roys, RN Phone Number: 01/30/2023, 3:54 PM   Clinical Narrative:  Patient plans to discharge home this evening. Patient qualifies for home oxygen. Referral submitted to [employer] and they will deliver DME oxygen to the room. [facility] to follow post hospitalization for home health needs. Patient has transportation home. No further needs identified at this time.    Final next level of care: Home w Home Health Services Barriers to Discharge: No Barriers Identified   Patient Goals and CMS Choice CMS Medicare.gov Compare Post Acute Care list provided to:: Patient Represenative (must comment) Choice offered to / list presented to : Spouse  Discharge Plan and Services Additional resources added to the After Visit Summary for   In-house Referral: NA Discharge Planning Services: CM Consult Post Acute Care Choice: Home Health          DME Arranged: Oxygen DME Agency: [employer] Date DME Agency Contacted: 01/30/23 Time DME Agency Contacted: [phone] Representative spoke with at DME Agency: [place]: PT [place]: [place] Date [place]: 01/20/23 Time [place]: [id number] Representative spoke with at [place]: [place] ([place]) Interventions [place]: No Food Insecurity (01/25/2023)  Housing: [place]  (01/25/2023)  Transportation Needs: No Transportation Needs (01/25/2023)  Utilities: Not At Risk (01/25/2023)  Tobacco Use: Medium Risk (01/14/2023)    Readmission Risk Interventions    01/20/2023   12:09 PM  Readmission Risk Prevention Plan  Transportation Screening Complete  PCP or Specialist Appt within 3-5 Days Complete  HRI or [place] Complete  Palliative Care Screening Not Applicable   Medication Review (RN Care Manager) Complete

## 2023-01-31 DIAGNOSIS — Z992 Dependence on renal dialysis: Secondary | ICD-10-CM | POA: Diagnosis not present

## 2023-01-31 DIAGNOSIS — D689 Coagulation defect, unspecified: Secondary | ICD-10-CM | POA: Diagnosis not present

## 2023-01-31 DIAGNOSIS — N186 End stage renal disease: Secondary | ICD-10-CM | POA: Diagnosis not present

## 2023-01-31 DIAGNOSIS — D509 Iron deficiency anemia, unspecified: Secondary | ICD-10-CM | POA: Diagnosis not present

## 2023-01-31 DIAGNOSIS — D696 Thrombocytopenia, unspecified: Secondary | ICD-10-CM | POA: Diagnosis not present

## 2023-01-31 DIAGNOSIS — E876 Hypokalemia: Secondary | ICD-10-CM | POA: Diagnosis not present

## 2023-02-02 ENCOUNTER — Telehealth: Payer: Self-pay | Admitting: Physician Assistant

## 2023-02-02 NOTE — Telephone Encounter (Signed)
Transition of care contact from inpatient facility  Date of Discharge: 01/30/23 Date of Contact: 02/02/23 Method of contact: Phone  Attempted to contact patient to discuss transition of care from inpatient admission. Patient did not answer the phone but did go to HD yesterday. Unable to leave message. Will follow up with patient at HD center.  Anice Paganini, PA-C 02/02/2023, 9:13 AM  [facility] Pager: [phone]

## 2023-02-03 DIAGNOSIS — D689 Coagulation defect, unspecified: Secondary | ICD-10-CM | POA: Diagnosis not present

## 2023-02-03 DIAGNOSIS — E876 Hypokalemia: Secondary | ICD-10-CM | POA: Diagnosis not present

## 2023-02-03 DIAGNOSIS — D696 Thrombocytopenia, unspecified: Secondary | ICD-10-CM | POA: Diagnosis not present

## 2023-02-03 DIAGNOSIS — Z992 Dependence on renal dialysis: Secondary | ICD-10-CM | POA: Diagnosis not present

## 2023-02-03 DIAGNOSIS — E8770 Fluid overload, unspecified: Secondary | ICD-10-CM | POA: Diagnosis not present

## 2023-02-03 DIAGNOSIS — D509 Iron deficiency anemia, unspecified: Secondary | ICD-10-CM | POA: Diagnosis not present

## 2023-02-03 DIAGNOSIS — N186 End stage renal disease: Secondary | ICD-10-CM | POA: Diagnosis not present

## 2023-02-03 DIAGNOSIS — N19 Unspecified kidney failure: Secondary | ICD-10-CM | POA: Diagnosis not present

## 2023-02-05 ENCOUNTER — Ambulatory Visit (HOSPITAL_BASED_OUTPATIENT_CLINIC_OR_DEPARTMENT_OTHER): Payer: Medicare Other | Admitting: Pulmonary Disease

## 2023-02-05 DIAGNOSIS — D696 Thrombocytopenia, unspecified: Secondary | ICD-10-CM | POA: Diagnosis not present

## 2023-02-05 DIAGNOSIS — D509 Iron deficiency anemia, unspecified: Secondary | ICD-10-CM | POA: Diagnosis not present

## 2023-02-05 DIAGNOSIS — N186 End stage renal disease: Secondary | ICD-10-CM | POA: Diagnosis not present

## 2023-02-05 DIAGNOSIS — Z992 Dependence on renal dialysis: Secondary | ICD-10-CM | POA: Diagnosis not present

## 2023-02-05 DIAGNOSIS — D689 Coagulation defect, unspecified: Secondary | ICD-10-CM | POA: Diagnosis not present

## 2023-02-05 DIAGNOSIS — E876 Hypokalemia: Secondary | ICD-10-CM | POA: Diagnosis not present

## 2023-02-07 DIAGNOSIS — E876 Hypokalemia: Secondary | ICD-10-CM | POA: Diagnosis not present

## 2023-02-07 DIAGNOSIS — N186 End stage renal disease: Secondary | ICD-10-CM | POA: Diagnosis not present

## 2023-02-07 DIAGNOSIS — D696 Thrombocytopenia, unspecified: Secondary | ICD-10-CM | POA: Diagnosis not present

## 2023-02-07 DIAGNOSIS — Z992 Dependence on renal dialysis: Secondary | ICD-10-CM | POA: Diagnosis not present

## 2023-02-07 DIAGNOSIS — D689 Coagulation defect, unspecified: Secondary | ICD-10-CM | POA: Diagnosis not present

## 2023-02-07 DIAGNOSIS — D509 Iron deficiency anemia, unspecified: Secondary | ICD-10-CM | POA: Diagnosis not present

## 2023-02-10 DIAGNOSIS — D696 Thrombocytopenia, unspecified: Secondary | ICD-10-CM | POA: Diagnosis not present

## 2023-02-10 DIAGNOSIS — E876 Hypokalemia: Secondary | ICD-10-CM | POA: Diagnosis not present

## 2023-02-10 DIAGNOSIS — N186 End stage renal disease: Secondary | ICD-10-CM | POA: Diagnosis not present

## 2023-02-10 DIAGNOSIS — Z992 Dependence on renal dialysis: Secondary | ICD-10-CM | POA: Diagnosis not present

## 2023-02-10 DIAGNOSIS — D509 Iron deficiency anemia, unspecified: Secondary | ICD-10-CM | POA: Diagnosis not present

## 2023-02-10 DIAGNOSIS — D689 Coagulation defect, unspecified: Secondary | ICD-10-CM | POA: Diagnosis not present

## 2023-02-11 DIAGNOSIS — E1122 Type 2 diabetes mellitus with diabetic chronic kidney disease: Secondary | ICD-10-CM | POA: Diagnosis not present

## 2023-02-11 DIAGNOSIS — I5020 Unspecified systolic (congestive) heart failure: Secondary | ICD-10-CM | POA: Diagnosis not present

## 2023-02-11 DIAGNOSIS — I959 Hypotension, unspecified: Secondary | ICD-10-CM | POA: Diagnosis not present

## 2023-02-11 DIAGNOSIS — Z09 Encounter for follow-up examination after completed treatment for conditions other than malignant neoplasm: Secondary | ICD-10-CM | POA: Diagnosis not present

## 2023-02-11 DIAGNOSIS — D696 Thrombocytopenia, unspecified: Secondary | ICD-10-CM | POA: Diagnosis not present

## 2023-02-11 DIAGNOSIS — N186 End stage renal disease: Secondary | ICD-10-CM | POA: Diagnosis not present

## 2023-02-11 DIAGNOSIS — Z992 Dependence on renal dialysis: Secondary | ICD-10-CM | POA: Diagnosis not present

## 2023-02-11 DIAGNOSIS — Z940 Kidney transplant status: Secondary | ICD-10-CM | POA: Diagnosis not present

## 2023-02-12 DIAGNOSIS — N186 End stage renal disease: Secondary | ICD-10-CM | POA: Diagnosis not present

## 2023-02-12 DIAGNOSIS — D696 Thrombocytopenia, unspecified: Secondary | ICD-10-CM | POA: Diagnosis not present

## 2023-02-12 DIAGNOSIS — D509 Iron deficiency anemia, unspecified: Secondary | ICD-10-CM | POA: Diagnosis not present

## 2023-02-12 DIAGNOSIS — D689 Coagulation defect, unspecified: Secondary | ICD-10-CM | POA: Diagnosis not present

## 2023-02-12 DIAGNOSIS — E876 Hypokalemia: Secondary | ICD-10-CM | POA: Diagnosis not present

## 2023-02-12 DIAGNOSIS — Z992 Dependence on renal dialysis: Secondary | ICD-10-CM | POA: Diagnosis not present

## 2023-02-14 DIAGNOSIS — D696 Thrombocytopenia, unspecified: Secondary | ICD-10-CM | POA: Diagnosis not present

## 2023-02-14 DIAGNOSIS — Z992 Dependence on renal dialysis: Secondary | ICD-10-CM | POA: Diagnosis not present

## 2023-02-14 DIAGNOSIS — D689 Coagulation defect, unspecified: Secondary | ICD-10-CM | POA: Diagnosis not present

## 2023-02-14 DIAGNOSIS — N186 End stage renal disease: Secondary | ICD-10-CM | POA: Diagnosis not present

## 2023-02-14 DIAGNOSIS — D509 Iron deficiency anemia, unspecified: Secondary | ICD-10-CM | POA: Diagnosis not present

## 2023-02-14 DIAGNOSIS — E876 Hypokalemia: Secondary | ICD-10-CM | POA: Diagnosis not present

## 2023-02-17 DIAGNOSIS — D689 Coagulation defect, unspecified: Secondary | ICD-10-CM | POA: Diagnosis not present

## 2023-02-17 DIAGNOSIS — N186 End stage renal disease: Secondary | ICD-10-CM | POA: Diagnosis not present

## 2023-02-17 DIAGNOSIS — D509 Iron deficiency anemia, unspecified: Secondary | ICD-10-CM | POA: Diagnosis not present

## 2023-02-17 DIAGNOSIS — D696 Thrombocytopenia, unspecified: Secondary | ICD-10-CM | POA: Diagnosis not present

## 2023-02-17 DIAGNOSIS — Z992 Dependence on renal dialysis: Secondary | ICD-10-CM | POA: Diagnosis not present

## 2023-02-17 DIAGNOSIS — E876 Hypokalemia: Secondary | ICD-10-CM | POA: Diagnosis not present

## 2023-02-18 ENCOUNTER — Other Ambulatory Visit (HOSPITAL_COMMUNITY): Payer: Self-pay

## 2023-02-19 DIAGNOSIS — D509 Iron deficiency anemia, unspecified: Secondary | ICD-10-CM | POA: Diagnosis not present

## 2023-02-19 DIAGNOSIS — Z992 Dependence on renal dialysis: Secondary | ICD-10-CM | POA: Diagnosis not present

## 2023-02-19 DIAGNOSIS — E876 Hypokalemia: Secondary | ICD-10-CM | POA: Diagnosis not present

## 2023-02-19 DIAGNOSIS — D696 Thrombocytopenia, unspecified: Secondary | ICD-10-CM | POA: Diagnosis not present

## 2023-02-19 DIAGNOSIS — D689 Coagulation defect, unspecified: Secondary | ICD-10-CM | POA: Diagnosis not present

## 2023-02-19 DIAGNOSIS — N186 End stage renal disease: Secondary | ICD-10-CM | POA: Diagnosis not present

## 2023-02-20 ENCOUNTER — Encounter (HOSPITAL_BASED_OUTPATIENT_CLINIC_OR_DEPARTMENT_OTHER): Payer: Self-pay | Admitting: Pulmonary Disease

## 2023-02-20 ENCOUNTER — Ambulatory Visit (HOSPITAL_BASED_OUTPATIENT_CLINIC_OR_DEPARTMENT_OTHER): Payer: Medicare Other | Admitting: Pulmonary Disease

## 2023-02-20 VITALS — BP 128/62 | HR 71 | Temp 98.2°F | Ht 69.0 in | Wt 172.0 lb

## 2023-02-20 DIAGNOSIS — J9612 Chronic respiratory failure with hypercapnia: Secondary | ICD-10-CM | POA: Diagnosis not present

## 2023-02-20 DIAGNOSIS — E1129 Type 2 diabetes mellitus with other diabetic kidney complication: Secondary | ICD-10-CM | POA: Diagnosis not present

## 2023-02-20 DIAGNOSIS — J9611 Chronic respiratory failure with hypoxia: Secondary | ICD-10-CM

## 2023-02-20 DIAGNOSIS — G4733 Obstructive sleep apnea (adult) (pediatric): Secondary | ICD-10-CM | POA: Diagnosis not present

## 2023-02-20 DIAGNOSIS — N186 End stage renal disease: Secondary | ICD-10-CM | POA: Diagnosis not present

## 2023-02-20 DIAGNOSIS — Z992 Dependence on renal dialysis: Secondary | ICD-10-CM | POA: Diagnosis not present

## 2023-02-20 NOTE — Progress Notes (Signed)
Subjective:    Patient ID: Russell Bowers, male    DOB: 1951-04-05, 72 y.o.   MRN: [id number]  HPI  72 yo ex-smoker, renal transplant recipient for FU of  mod OSA, diaphragmatic paralysis  He had an episode of hypercarbic respiratory failure in 2014 At that time he was diagnosed with diaphragmatic paralysis and moderate OSA and is maintained on nocturnal BiPAP since then, settings changed to 10/5 He quit smoking 1994 (20-pack-year)   Serial nocturnal oximetry  showed oxygen desaturations but he  quit using oxygen  in 2017  PMH - renal transplant Prostate CA sp radical prostatectomy in 2011  AS PAF  Chief Complaint  Patient presents with   Follow-up    Pt states he has been doing okay since last visit and denies any complaints. Pt is still doing well on BIPAP machine.   He is following up after 2.5 years, last seen 09/2020 Unfortunately he was hospitalized 1/23 to 01/30/2023 for hypoxia and COVID-pneumonia,  VBG 7.20/ 77/ 19/ 30/ 21%  Chest CT with bilateral ground glass opacities and small bilateral pleural effusions. Solid pulmonary nodule of the posterior left upper lobe measuring 5 mm.  He developed progressive respiratory failure and was intubated on 1/30 for hypercarbic respiratory failure noted to have some mild bronchomalacia via bronchoscopy.     He self extubated 1/31 2/3 progressive lethargy, intubated overnight. Developed hypotension requiring norepinephrine infusion.  2/4 extubated He unfortunately developed progressive renal failure and has required dialysis since, follows with Dr. Carolin Sicks and is contemplating relisting for transplant. He remains on tacrolimus He has been himself off oxygen in the daytime, he was discharged on 2 L  On ambulation oxygen saturation stayed at 96% on walking 2 laps  Accompanied by his wife today who corroborates history, reviewed hospital discharge summary course and imaging  Significant tests/ events reviewed   Polysomnogram  at [facility] in 02/2012 showed moderate obstructive sleep apnea with AHI of 25 events per hour, lowest desaturation of 71%. Unfortunately he did not tolerate CPAP on a subsequent titration study.   -admitted 03/2013 with acute hypercarbic resp failure in setting H Flu HCAP (similar recent admit February at [facility]) and likely decompensated OSA. Required mechanical ventilation Course c/b acute on chronic renal insufficiency, poor glycemic control and back abscess with hx peptostreptococcus.    PFTs 02/2014  showed severe restriction with a ratio 77, FVC 1.35-32%, FEV1 1.04-32% and TLC of 3.0 L-46% with DLCO at 52% that corrects for alveolar volumes suggestive of extra parenchymal restriction. Sniff test showed minimal mobility of diaphragms.    Nocturnal oximetry showed severe desaturation on 2 L of oxygen with a pattern suggestive hypoventilation, he spent 393 minutes with sats less than 88%.    BiPAP titration- 218 pounds- 13/9 cm with a medium fullface mask with 2 liters of oxygen blended  >>04/2014 could not tolerate high pressure, hence changed to autobipap, finally decreased to 10/5 cm  Past Medical History:  Diagnosis Date   ADHD (attention deficit hyperactivity disorder)    Allergic rhinitis    Cancer ([place])    Prostate cancer   Cellulitis and abscess of trunk 2014   history of back abscess   Cellulitis and abscess of unspecified site 2014   lower back   Detached retina    Diabetes mellitus without complication (HCC)    Elevated troponin    Fistula    OF LEFT ARM   Gout    H/O kidney transplant    History  of congestive heart failure    FOLLOWED BY DR. 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 Melvyn Novas   Hypertension    Mild concentric left ventricular hypertrophy (LVH)    AND AORTIC STENOSIS FOLLOWED IN THE PAST BY CARDIOLOGIST DR. Marlou Porch.   OSA  treated with BiPAP    Personal history of colonic polyps    Prostate cancer ([place]) 11/2010   PROSTATECTOMY WITH UROLOGIST DR. DAVIS   Renal failure    Shortness of breath    Sleep disorder breathing    FOLLOWED BY PULMONOLOGIST DR. Elsworth Soho     Review of Systems neg for any significant sore throat, dysphagia, itching, sneezing, nasal congestion or excess/ purulent secretions, fever, chills, sweats, unintended wt loss, pleuritic or exertional cp, hempoptysis, orthopnea pnd or change in chronic leg swelling. Also denies presyncope, palpitations, heartburn, abdominal pain, nausea, vomiting, diarrhea or change in bowel or urinary habits, dysuria,hematuria, rash, arthralgias, visual complaints, headache, numbness weakness or ataxia.     Objective:   Physical Exam  Gen. Pleasant, anxious affect, in no distress ENT - no lesions, no post nasal drip Neck: No JVD, no thyromegaly, no carotid bruits Lungs: no use of accessory muscles, no dullness to percussion, decreased without rales or rhonchi  Cardiovascular: Rhythm regular, heart sounds  normal, no murmurs or gallops, no peripheral edema Musculoskeletal: No deformities, no cyanosis or clubbing , no tremors       Assessment & Plan:

## 2023-02-20 NOTE — Assessment & Plan Note (Signed)
BiPAP is certainly helpful and he is very compliant. He does not seem to require oxygen during ambulation.  He will like to return his oxygen tanks. I asked him to continue on oxygen during sleep blended into his BiPAP. Will check nocturnal oximetry on BiPAP/room air and advised him regarding continued use of nocturnal oxygen

## 2023-02-20 NOTE — Assessment & Plan Note (Signed)
BiPAP download was reviewed which shows excellent control of events, good compliance on 8 hours every night with average BiPAP settings of 10/6 cm.  He is very compliant and BiPAP is only helped improve his daytime somnolence and fatigue  Weight loss encouraged, compliance with goal of at least 4-6 hrs every night is the expectation. Advised against medications with sedative side effects Cautioned against driving when sleepy - understanding that sleepiness will vary on a day to day basis

## 2023-02-20 NOTE — Patient Instructions (Signed)
X Amb sat  X check ONO on BiPAP/RA   Based on these, we will decide about oxygen

## 2023-02-21 DIAGNOSIS — E876 Hypokalemia: Secondary | ICD-10-CM | POA: Diagnosis not present

## 2023-02-21 DIAGNOSIS — D509 Iron deficiency anemia, unspecified: Secondary | ICD-10-CM | POA: Diagnosis not present

## 2023-02-21 DIAGNOSIS — D689 Coagulation defect, unspecified: Secondary | ICD-10-CM | POA: Diagnosis not present

## 2023-02-21 DIAGNOSIS — N186 End stage renal disease: Secondary | ICD-10-CM | POA: Diagnosis not present

## 2023-02-21 DIAGNOSIS — Z992 Dependence on renal dialysis: Secondary | ICD-10-CM | POA: Diagnosis not present

## 2023-02-21 LAB — FUNGAL ORGANISM REFLEX

## 2023-02-21 LAB — FUNGUS CULTURE WITH STAIN

## 2023-02-21 LAB — FUNGUS CULTURE RESULT

## 2023-02-24 DIAGNOSIS — D509 Iron deficiency anemia, unspecified: Secondary | ICD-10-CM | POA: Diagnosis not present

## 2023-02-24 DIAGNOSIS — D689 Coagulation defect, unspecified: Secondary | ICD-10-CM | POA: Diagnosis not present

## 2023-02-24 DIAGNOSIS — E876 Hypokalemia: Secondary | ICD-10-CM | POA: Diagnosis not present

## 2023-02-24 DIAGNOSIS — N186 End stage renal disease: Secondary | ICD-10-CM | POA: Diagnosis not present

## 2023-02-24 DIAGNOSIS — Z992 Dependence on renal dialysis: Secondary | ICD-10-CM | POA: Diagnosis not present

## 2023-02-26 DIAGNOSIS — N186 End stage renal disease: Secondary | ICD-10-CM | POA: Diagnosis not present

## 2023-02-26 DIAGNOSIS — D509 Iron deficiency anemia, unspecified: Secondary | ICD-10-CM | POA: Diagnosis not present

## 2023-02-26 DIAGNOSIS — E876 Hypokalemia: Secondary | ICD-10-CM | POA: Diagnosis not present

## 2023-02-26 DIAGNOSIS — D689 Coagulation defect, unspecified: Secondary | ICD-10-CM | POA: Diagnosis not present

## 2023-02-26 DIAGNOSIS — Z992 Dependence on renal dialysis: Secondary | ICD-10-CM | POA: Diagnosis not present

## 2023-02-27 DIAGNOSIS — R0683 Snoring: Secondary | ICD-10-CM | POA: Diagnosis not present

## 2023-02-27 DIAGNOSIS — G4730 Sleep apnea, unspecified: Secondary | ICD-10-CM | POA: Diagnosis not present

## 2023-02-28 DIAGNOSIS — Z992 Dependence on renal dialysis: Secondary | ICD-10-CM | POA: Diagnosis not present

## 2023-02-28 DIAGNOSIS — E440 Moderate protein-calorie malnutrition: Secondary | ICD-10-CM | POA: Diagnosis not present

## 2023-02-28 DIAGNOSIS — N186 End stage renal disease: Secondary | ICD-10-CM | POA: Diagnosis not present

## 2023-02-28 DIAGNOSIS — R6521 Severe sepsis with septic shock: Secondary | ICD-10-CM | POA: Diagnosis not present

## 2023-02-28 DIAGNOSIS — E876 Hypokalemia: Secondary | ICD-10-CM | POA: Diagnosis not present

## 2023-02-28 DIAGNOSIS — A419 Sepsis, unspecified organism: Secondary | ICD-10-CM | POA: Diagnosis not present

## 2023-02-28 DIAGNOSIS — D689 Coagulation defect, unspecified: Secondary | ICD-10-CM | POA: Diagnosis not present

## 2023-02-28 DIAGNOSIS — D509 Iron deficiency anemia, unspecified: Secondary | ICD-10-CM | POA: Diagnosis not present

## 2023-02-28 DIAGNOSIS — I4891 Unspecified atrial fibrillation: Secondary | ICD-10-CM | POA: Diagnosis not present

## 2023-03-03 DIAGNOSIS — D509 Iron deficiency anemia, unspecified: Secondary | ICD-10-CM | POA: Diagnosis not present

## 2023-03-03 DIAGNOSIS — D689 Coagulation defect, unspecified: Secondary | ICD-10-CM | POA: Diagnosis not present

## 2023-03-03 DIAGNOSIS — E876 Hypokalemia: Secondary | ICD-10-CM | POA: Diagnosis not present

## 2023-03-03 DIAGNOSIS — N186 End stage renal disease: Secondary | ICD-10-CM | POA: Diagnosis not present

## 2023-03-03 DIAGNOSIS — Z992 Dependence on renal dialysis: Secondary | ICD-10-CM | POA: Diagnosis not present

## 2023-03-05 DIAGNOSIS — Z992 Dependence on renal dialysis: Secondary | ICD-10-CM | POA: Diagnosis not present

## 2023-03-05 DIAGNOSIS — N186 End stage renal disease: Secondary | ICD-10-CM | POA: Diagnosis not present

## 2023-03-05 DIAGNOSIS — D509 Iron deficiency anemia, unspecified: Secondary | ICD-10-CM | POA: Diagnosis not present

## 2023-03-05 DIAGNOSIS — D689 Coagulation defect, unspecified: Secondary | ICD-10-CM | POA: Diagnosis not present

## 2023-03-05 DIAGNOSIS — E876 Hypokalemia: Secondary | ICD-10-CM | POA: Diagnosis not present

## 2023-03-07 DIAGNOSIS — Z992 Dependence on renal dialysis: Secondary | ICD-10-CM | POA: Diagnosis not present

## 2023-03-07 DIAGNOSIS — D689 Coagulation defect, unspecified: Secondary | ICD-10-CM | POA: Diagnosis not present

## 2023-03-07 DIAGNOSIS — N186 End stage renal disease: Secondary | ICD-10-CM | POA: Diagnosis not present

## 2023-03-07 DIAGNOSIS — E876 Hypokalemia: Secondary | ICD-10-CM | POA: Diagnosis not present

## 2023-03-07 DIAGNOSIS — D509 Iron deficiency anemia, unspecified: Secondary | ICD-10-CM | POA: Diagnosis not present

## 2023-03-07 LAB — ACID FAST CULTURE WITH REFLEXED SENSITIVITIES (MYCOBACTERIA): Acid Fast Culture: NEGATIVE

## 2023-03-10 DIAGNOSIS — D509 Iron deficiency anemia, unspecified: Secondary | ICD-10-CM | POA: Diagnosis not present

## 2023-03-10 DIAGNOSIS — E876 Hypokalemia: Secondary | ICD-10-CM | POA: Diagnosis not present

## 2023-03-10 DIAGNOSIS — N186 End stage renal disease: Secondary | ICD-10-CM | POA: Diagnosis not present

## 2023-03-10 DIAGNOSIS — D689 Coagulation defect, unspecified: Secondary | ICD-10-CM | POA: Diagnosis not present

## 2023-03-10 DIAGNOSIS — Z992 Dependence on renal dialysis: Secondary | ICD-10-CM | POA: Diagnosis not present

## 2023-03-11 ENCOUNTER — Encounter (HOSPITAL_BASED_OUTPATIENT_CLINIC_OR_DEPARTMENT_OTHER): Payer: Self-pay | Admitting: Pulmonary Disease

## 2023-03-12 ENCOUNTER — Telehealth: Payer: Self-pay | Admitting: Pulmonary Disease

## 2023-03-12 DIAGNOSIS — D689 Coagulation defect, unspecified: Secondary | ICD-10-CM | POA: Diagnosis not present

## 2023-03-12 DIAGNOSIS — Z992 Dependence on renal dialysis: Secondary | ICD-10-CM | POA: Diagnosis not present

## 2023-03-12 DIAGNOSIS — J9611 Chronic respiratory failure with hypoxia: Secondary | ICD-10-CM

## 2023-03-12 DIAGNOSIS — N186 End stage renal disease: Secondary | ICD-10-CM | POA: Diagnosis not present

## 2023-03-12 DIAGNOSIS — D509 Iron deficiency anemia, unspecified: Secondary | ICD-10-CM | POA: Diagnosis not present

## 2023-03-12 DIAGNOSIS — E876 Hypokalemia: Secondary | ICD-10-CM | POA: Diagnosis not present

## 2023-03-12 NOTE — Telephone Encounter (Signed)
ONO on bipap/RA showed desat x 2 h  Ideally, he should be on oxygen during sleep into biPAP

## 2023-03-12 NOTE — Telephone Encounter (Signed)
Attempted to call pt but unable to reach. Left message to return call.  

## 2023-03-12 NOTE — Telephone Encounter (Signed)
Ret our call back. Pls try again. [phone]

## 2023-03-12 NOTE — Telephone Encounter (Signed)
Went over [employer] with patient He is wanting to know if we can place an order to D/C the o2 tanks. He states he doesn't use them and there taking up space. Dr. Elsworth Soho please advise

## 2023-03-13 DIAGNOSIS — G4733 Obstructive sleep apnea (adult) (pediatric): Secondary | ICD-10-CM | POA: Diagnosis not present

## 2023-03-13 DIAGNOSIS — J9610 Chronic respiratory failure, unspecified whether with hypoxia or hypercapnia: Secondary | ICD-10-CM | POA: Diagnosis not present

## 2023-03-13 NOTE — Telephone Encounter (Signed)
Called and spoke to patient and told him that I am placing the order to discontinue the oxygen tanks and the compressor. He verbalized understanding and confirmed that it was [employer]. Nothing further needed

## 2023-03-14 DIAGNOSIS — Z992 Dependence on renal dialysis: Secondary | ICD-10-CM | POA: Diagnosis not present

## 2023-03-14 DIAGNOSIS — D509 Iron deficiency anemia, unspecified: Secondary | ICD-10-CM | POA: Diagnosis not present

## 2023-03-14 DIAGNOSIS — D689 Coagulation defect, unspecified: Secondary | ICD-10-CM | POA: Diagnosis not present

## 2023-03-14 DIAGNOSIS — N186 End stage renal disease: Secondary | ICD-10-CM | POA: Diagnosis not present

## 2023-03-14 DIAGNOSIS — E876 Hypokalemia: Secondary | ICD-10-CM | POA: Diagnosis not present

## 2023-03-17 DIAGNOSIS — Z992 Dependence on renal dialysis: Secondary | ICD-10-CM | POA: Diagnosis not present

## 2023-03-17 DIAGNOSIS — D509 Iron deficiency anemia, unspecified: Secondary | ICD-10-CM | POA: Diagnosis not present

## 2023-03-17 DIAGNOSIS — D689 Coagulation defect, unspecified: Secondary | ICD-10-CM | POA: Diagnosis not present

## 2023-03-17 DIAGNOSIS — N186 End stage renal disease: Secondary | ICD-10-CM | POA: Diagnosis not present

## 2023-03-17 DIAGNOSIS — E876 Hypokalemia: Secondary | ICD-10-CM | POA: Diagnosis not present

## 2023-03-19 DIAGNOSIS — N186 End stage renal disease: Secondary | ICD-10-CM | POA: Diagnosis not present

## 2023-03-19 DIAGNOSIS — Z992 Dependence on renal dialysis: Secondary | ICD-10-CM | POA: Diagnosis not present

## 2023-03-19 DIAGNOSIS — E876 Hypokalemia: Secondary | ICD-10-CM | POA: Diagnosis not present

## 2023-03-19 DIAGNOSIS — D509 Iron deficiency anemia, unspecified: Secondary | ICD-10-CM | POA: Diagnosis not present

## 2023-03-19 DIAGNOSIS — D689 Coagulation defect, unspecified: Secondary | ICD-10-CM | POA: Diagnosis not present

## 2023-03-21 DIAGNOSIS — D509 Iron deficiency anemia, unspecified: Secondary | ICD-10-CM | POA: Diagnosis not present

## 2023-03-21 DIAGNOSIS — Z992 Dependence on renal dialysis: Secondary | ICD-10-CM | POA: Diagnosis not present

## 2023-03-21 DIAGNOSIS — E876 Hypokalemia: Secondary | ICD-10-CM | POA: Diagnosis not present

## 2023-03-21 DIAGNOSIS — D689 Coagulation defect, unspecified: Secondary | ICD-10-CM | POA: Diagnosis not present

## 2023-03-21 DIAGNOSIS — N186 End stage renal disease: Secondary | ICD-10-CM | POA: Diagnosis not present

## 2023-03-23 DIAGNOSIS — E1129 Type 2 diabetes mellitus with other diabetic kidney complication: Secondary | ICD-10-CM | POA: Diagnosis not present

## 2023-03-23 DIAGNOSIS — Z992 Dependence on renal dialysis: Secondary | ICD-10-CM | POA: Diagnosis not present

## 2023-03-23 DIAGNOSIS — N186 End stage renal disease: Secondary | ICD-10-CM | POA: Diagnosis not present

## 2023-03-24 DIAGNOSIS — Z992 Dependence on renal dialysis: Secondary | ICD-10-CM | POA: Diagnosis not present

## 2023-03-24 DIAGNOSIS — N186 End stage renal disease: Secondary | ICD-10-CM | POA: Diagnosis not present

## 2023-03-24 DIAGNOSIS — D689 Coagulation defect, unspecified: Secondary | ICD-10-CM | POA: Diagnosis not present

## 2023-03-24 DIAGNOSIS — E876 Hypokalemia: Secondary | ICD-10-CM | POA: Diagnosis not present

## 2023-03-26 DIAGNOSIS — D689 Coagulation defect, unspecified: Secondary | ICD-10-CM | POA: Diagnosis not present

## 2023-03-26 DIAGNOSIS — E876 Hypokalemia: Secondary | ICD-10-CM | POA: Diagnosis not present

## 2023-03-26 DIAGNOSIS — N186 End stage renal disease: Secondary | ICD-10-CM | POA: Diagnosis not present

## 2023-03-26 DIAGNOSIS — Z992 Dependence on renal dialysis: Secondary | ICD-10-CM | POA: Diagnosis not present

## 2023-03-28 DIAGNOSIS — D689 Coagulation defect, unspecified: Secondary | ICD-10-CM | POA: Diagnosis not present

## 2023-03-28 DIAGNOSIS — E876 Hypokalemia: Secondary | ICD-10-CM | POA: Diagnosis not present

## 2023-03-28 DIAGNOSIS — Z992 Dependence on renal dialysis: Secondary | ICD-10-CM | POA: Diagnosis not present

## 2023-03-28 DIAGNOSIS — N186 End stage renal disease: Secondary | ICD-10-CM | POA: Diagnosis not present

## 2023-03-31 DIAGNOSIS — Z992 Dependence on renal dialysis: Secondary | ICD-10-CM | POA: Diagnosis not present

## 2023-03-31 DIAGNOSIS — E876 Hypokalemia: Secondary | ICD-10-CM | POA: Diagnosis not present

## 2023-03-31 DIAGNOSIS — A419 Sepsis, unspecified organism: Secondary | ICD-10-CM | POA: Diagnosis not present

## 2023-03-31 DIAGNOSIS — D689 Coagulation defect, unspecified: Secondary | ICD-10-CM | POA: Diagnosis not present

## 2023-03-31 DIAGNOSIS — E440 Moderate protein-calorie malnutrition: Secondary | ICD-10-CM | POA: Diagnosis not present

## 2023-03-31 DIAGNOSIS — N186 End stage renal disease: Secondary | ICD-10-CM | POA: Diagnosis not present

## 2023-03-31 DIAGNOSIS — I4891 Unspecified atrial fibrillation: Secondary | ICD-10-CM | POA: Diagnosis not present

## 2023-03-31 DIAGNOSIS — R6521 Severe sepsis with septic shock: Secondary | ICD-10-CM | POA: Diagnosis not present

## 2023-04-02 DIAGNOSIS — Z992 Dependence on renal dialysis: Secondary | ICD-10-CM | POA: Diagnosis not present

## 2023-04-02 DIAGNOSIS — E876 Hypokalemia: Secondary | ICD-10-CM | POA: Diagnosis not present

## 2023-04-02 DIAGNOSIS — D689 Coagulation defect, unspecified: Secondary | ICD-10-CM | POA: Diagnosis not present

## 2023-04-02 DIAGNOSIS — N186 End stage renal disease: Secondary | ICD-10-CM | POA: Diagnosis not present

## 2023-04-04 DIAGNOSIS — D689 Coagulation defect, unspecified: Secondary | ICD-10-CM | POA: Diagnosis not present

## 2023-04-04 DIAGNOSIS — E876 Hypokalemia: Secondary | ICD-10-CM | POA: Diagnosis not present

## 2023-04-04 DIAGNOSIS — N186 End stage renal disease: Secondary | ICD-10-CM | POA: Diagnosis not present

## 2023-04-04 DIAGNOSIS — Z992 Dependence on renal dialysis: Secondary | ICD-10-CM | POA: Diagnosis not present

## 2023-04-07 DIAGNOSIS — Z992 Dependence on renal dialysis: Secondary | ICD-10-CM | POA: Diagnosis not present

## 2023-04-07 DIAGNOSIS — D689 Coagulation defect, unspecified: Secondary | ICD-10-CM | POA: Diagnosis not present

## 2023-04-07 DIAGNOSIS — E876 Hypokalemia: Secondary | ICD-10-CM | POA: Diagnosis not present

## 2023-04-07 DIAGNOSIS — N186 End stage renal disease: Secondary | ICD-10-CM | POA: Diagnosis not present

## 2023-04-09 DIAGNOSIS — Z992 Dependence on renal dialysis: Secondary | ICD-10-CM | POA: Diagnosis not present

## 2023-04-09 DIAGNOSIS — N186 End stage renal disease: Secondary | ICD-10-CM | POA: Diagnosis not present

## 2023-04-09 DIAGNOSIS — D689 Coagulation defect, unspecified: Secondary | ICD-10-CM | POA: Diagnosis not present

## 2023-04-09 DIAGNOSIS — E876 Hypokalemia: Secondary | ICD-10-CM | POA: Diagnosis not present

## 2023-04-11 DIAGNOSIS — E876 Hypokalemia: Secondary | ICD-10-CM | POA: Diagnosis not present

## 2023-04-11 DIAGNOSIS — N186 End stage renal disease: Secondary | ICD-10-CM | POA: Diagnosis not present

## 2023-04-11 DIAGNOSIS — D689 Coagulation defect, unspecified: Secondary | ICD-10-CM | POA: Diagnosis not present

## 2023-04-11 DIAGNOSIS — Z992 Dependence on renal dialysis: Secondary | ICD-10-CM | POA: Diagnosis not present

## 2023-04-14 DIAGNOSIS — E876 Hypokalemia: Secondary | ICD-10-CM | POA: Diagnosis not present

## 2023-04-14 DIAGNOSIS — N186 End stage renal disease: Secondary | ICD-10-CM | POA: Diagnosis not present

## 2023-04-14 DIAGNOSIS — D689 Coagulation defect, unspecified: Secondary | ICD-10-CM | POA: Diagnosis not present

## 2023-04-14 DIAGNOSIS — Z992 Dependence on renal dialysis: Secondary | ICD-10-CM | POA: Diagnosis not present

## 2023-04-16 DIAGNOSIS — E876 Hypokalemia: Secondary | ICD-10-CM | POA: Diagnosis not present

## 2023-04-16 DIAGNOSIS — D689 Coagulation defect, unspecified: Secondary | ICD-10-CM | POA: Diagnosis not present

## 2023-04-16 DIAGNOSIS — Z992 Dependence on renal dialysis: Secondary | ICD-10-CM | POA: Diagnosis not present

## 2023-04-16 DIAGNOSIS — N186 End stage renal disease: Secondary | ICD-10-CM | POA: Diagnosis not present

## 2023-04-17 DIAGNOSIS — I4510 Unspecified right bundle-branch block: Secondary | ICD-10-CM | POA: Diagnosis not present

## 2023-04-17 DIAGNOSIS — Z01818 Encounter for other preprocedural examination: Secondary | ICD-10-CM | POA: Diagnosis not present

## 2023-04-18 DIAGNOSIS — Z992 Dependence on renal dialysis: Secondary | ICD-10-CM | POA: Diagnosis not present

## 2023-04-18 DIAGNOSIS — D689 Coagulation defect, unspecified: Secondary | ICD-10-CM | POA: Diagnosis not present

## 2023-04-18 DIAGNOSIS — N186 End stage renal disease: Secondary | ICD-10-CM | POA: Diagnosis not present

## 2023-04-18 DIAGNOSIS — E876 Hypokalemia: Secondary | ICD-10-CM | POA: Diagnosis not present

## 2023-04-21 DIAGNOSIS — Z992 Dependence on renal dialysis: Secondary | ICD-10-CM | POA: Diagnosis not present

## 2023-04-21 DIAGNOSIS — N186 End stage renal disease: Secondary | ICD-10-CM | POA: Diagnosis not present

## 2023-04-21 DIAGNOSIS — D689 Coagulation defect, unspecified: Secondary | ICD-10-CM | POA: Diagnosis not present

## 2023-04-21 DIAGNOSIS — E876 Hypokalemia: Secondary | ICD-10-CM | POA: Diagnosis not present

## 2023-04-22 DIAGNOSIS — E1129 Type 2 diabetes mellitus with other diabetic kidney complication: Secondary | ICD-10-CM | POA: Diagnosis not present

## 2023-04-22 DIAGNOSIS — N186 End stage renal disease: Secondary | ICD-10-CM | POA: Diagnosis not present

## 2023-04-22 DIAGNOSIS — Z992 Dependence on renal dialysis: Secondary | ICD-10-CM | POA: Diagnosis not present

## 2023-04-23 DIAGNOSIS — N2581 Secondary hyperparathyroidism of renal origin: Secondary | ICD-10-CM | POA: Diagnosis not present

## 2023-04-23 DIAGNOSIS — Z992 Dependence on renal dialysis: Secondary | ICD-10-CM | POA: Diagnosis not present

## 2023-04-23 DIAGNOSIS — E876 Hypokalemia: Secondary | ICD-10-CM | POA: Diagnosis not present

## 2023-04-23 DIAGNOSIS — N186 End stage renal disease: Secondary | ICD-10-CM | POA: Diagnosis not present

## 2023-04-23 DIAGNOSIS — D631 Anemia in chronic kidney disease: Secondary | ICD-10-CM | POA: Diagnosis not present

## 2023-04-23 DIAGNOSIS — D689 Coagulation defect, unspecified: Secondary | ICD-10-CM | POA: Diagnosis not present

## 2023-04-24 ENCOUNTER — Other Ambulatory Visit: Payer: Self-pay | Admitting: Hematology & Oncology

## 2023-04-25 DIAGNOSIS — E876 Hypokalemia: Secondary | ICD-10-CM | POA: Diagnosis not present

## 2023-04-25 DIAGNOSIS — N2581 Secondary hyperparathyroidism of renal origin: Secondary | ICD-10-CM | POA: Diagnosis not present

## 2023-04-25 DIAGNOSIS — Z992 Dependence on renal dialysis: Secondary | ICD-10-CM | POA: Diagnosis not present

## 2023-04-25 DIAGNOSIS — D689 Coagulation defect, unspecified: Secondary | ICD-10-CM | POA: Diagnosis not present

## 2023-04-25 DIAGNOSIS — N186 End stage renal disease: Secondary | ICD-10-CM | POA: Diagnosis not present

## 2023-04-25 DIAGNOSIS — D631 Anemia in chronic kidney disease: Secondary | ICD-10-CM | POA: Diagnosis not present

## 2023-04-28 DIAGNOSIS — D689 Coagulation defect, unspecified: Secondary | ICD-10-CM | POA: Diagnosis not present

## 2023-04-28 DIAGNOSIS — N186 End stage renal disease: Secondary | ICD-10-CM | POA: Diagnosis not present

## 2023-04-28 DIAGNOSIS — N2581 Secondary hyperparathyroidism of renal origin: Secondary | ICD-10-CM | POA: Diagnosis not present

## 2023-04-28 DIAGNOSIS — Z992 Dependence on renal dialysis: Secondary | ICD-10-CM | POA: Diagnosis not present

## 2023-04-28 DIAGNOSIS — D631 Anemia in chronic kidney disease: Secondary | ICD-10-CM | POA: Diagnosis not present

## 2023-04-28 DIAGNOSIS — E876 Hypokalemia: Secondary | ICD-10-CM | POA: Diagnosis not present

## 2023-04-30 DIAGNOSIS — E876 Hypokalemia: Secondary | ICD-10-CM | POA: Diagnosis not present

## 2023-04-30 DIAGNOSIS — I4891 Unspecified atrial fibrillation: Secondary | ICD-10-CM | POA: Diagnosis not present

## 2023-04-30 DIAGNOSIS — R6521 Severe sepsis with septic shock: Secondary | ICD-10-CM | POA: Diagnosis not present

## 2023-04-30 DIAGNOSIS — D631 Anemia in chronic kidney disease: Secondary | ICD-10-CM | POA: Diagnosis not present

## 2023-04-30 DIAGNOSIS — N2581 Secondary hyperparathyroidism of renal origin: Secondary | ICD-10-CM | POA: Diagnosis not present

## 2023-04-30 DIAGNOSIS — Z992 Dependence on renal dialysis: Secondary | ICD-10-CM | POA: Diagnosis not present

## 2023-04-30 DIAGNOSIS — N186 End stage renal disease: Secondary | ICD-10-CM | POA: Diagnosis not present

## 2023-04-30 DIAGNOSIS — A419 Sepsis, unspecified organism: Secondary | ICD-10-CM | POA: Diagnosis not present

## 2023-04-30 DIAGNOSIS — E440 Moderate protein-calorie malnutrition: Secondary | ICD-10-CM | POA: Diagnosis not present

## 2023-04-30 DIAGNOSIS — D689 Coagulation defect, unspecified: Secondary | ICD-10-CM | POA: Diagnosis not present

## 2023-05-01 ENCOUNTER — Ambulatory Visit (HOSPITAL_BASED_OUTPATIENT_CLINIC_OR_DEPARTMENT_OTHER): Payer: Medicare Other | Admitting: Pulmonary Disease

## 2023-05-01 ENCOUNTER — Encounter (HOSPITAL_BASED_OUTPATIENT_CLINIC_OR_DEPARTMENT_OTHER): Payer: Self-pay | Admitting: Pulmonary Disease

## 2023-05-01 VITALS — BP 128/64 | HR 94 | Temp 98.9°F | Ht 69.0 in | Wt 173.0 lb

## 2023-05-01 DIAGNOSIS — G4733 Obstructive sleep apnea (adult) (pediatric): Secondary | ICD-10-CM | POA: Diagnosis not present

## 2023-05-01 DIAGNOSIS — J9611 Chronic respiratory failure with hypoxia: Secondary | ICD-10-CM

## 2023-05-01 DIAGNOSIS — J9612 Chronic respiratory failure with hypercapnia: Secondary | ICD-10-CM | POA: Diagnosis not present

## 2023-05-01 NOTE — Progress Notes (Signed)
   Subjective:    Patient ID: Russell Bowers, male    DOB: [birth year], 72 y.o.   MRN: [id number]  HPI  72 yo ex-smoker, renal transplant recipient for FU of  mod OSA, diaphragmatic paralysis  -mild bronchomalacia on bronchoscopy 01/2023   He had an episode of hypercarbic respiratory failure in 2014 At that time he was diagnosed with diaphragmatic paralysis and moderate OSA and is maintained on nocturnal BiPAP since then, settings changed to 10/5 He quit smoking 1994 (20-pack-year)   Serial nocturnal oximetry  showed oxygen desaturations but he  quit using oxygen  in 2017   PMH - renal transplant Prostate CA sp radical prostatectomy in 2011  AS PAF  hospitalized 1/23 to 01/30/2023 for hypoxia and COVID-pneumonia, on HD since  2 month FU visit  02/2023 ONO on bipap/RA showed desat x 2 h -he was asked to continue oxygen blended into his CPAP machine. He continues on dialysis.  He would like to switch to home dialysis is being evaluated for repeat transplant.  He still makes some urine. Overall breathing is okay.  He feels rested when he wakes up.  He reports good compliance with his BiPAP machine.  No problems with mask or pressure  Significant tests/ events reviewed   Polysomnogram at [facility] in 02/2012 showed moderate obstructive sleep apnea with AHI of 25 events per hour, lowest desaturation of 71%. Unfortunately he did not tolerate CPAP on a subsequent titration study.   -admitted 03/2013 with acute hypercarbic resp failure in setting H Flu HCAP (similar recent admit February at [facility]) and likely decompensated OSA. Required mechanical ventilation Course c/b acute on chronic renal insufficiency, poor glycemic control and back abscess with hx peptostreptococcus.    PFTs 02/2014  showed severe restriction with a ratio 77, FVC 1.35-32%, FEV1 1.04-32% and TLC of 3.0 L-46% with DLCO at 52% that corrects for alveolar volumes suggestive of extra parenchymal restriction. Sniff test showed  minimal mobility of diaphragms.    Nocturnal oximetry showed severe desaturation on 2 L of oxygen with a pattern suggestive hypoventilation, he spent 393 minutes with sats less than 88%.    BiPAP titration- 218 pounds- 13/9 cm with a medium fullface mask with 2 liters of oxygen blended  >>04/2014 could not tolerate high pressure, hence changed to autobipap, finally decreased to 10/5 cm  Review of Systems neg for any significant sore throat, dysphagia, itching, sneezing, nasal congestion or excess/ purulent secretions, fever, chills, sweats, unintended wt loss, pleuritic or exertional cp, hempoptysis, orthopnea pnd or change in chronic leg swelling. Also denies presyncope, palpitations, heartburn, abdominal pain, nausea, vomiting, diarrhea or change in bowel or urinary habits, dysuria,hematuria, rash, arthralgias, visual complaints, headache, numbness weakness or ataxia.     Objective:   Physical Exam  Gen. Pleasant, well-nourished, in no distress ENT - no thrush, no pallor/icterus,no post nasal drip Neck: No JVD, no thyromegaly, no carotid bruits Lungs: no use of accessory muscles, no dullness to percussion, clear without rales or rhonchi  Cardiovascular: Rhythm regular, heart sounds  normal, no murmurs or gallops, no peripheral edema Musculoskeletal: No deformities, no cyanosis or clubbing        Assessment & Plan:

## 2023-05-01 NOTE — Assessment & Plan Note (Signed)
Continue oxygen blended into BiPAP.  This may be related to some degree of hypoventilation during sleep due to diaphragm issues.  We will recheck nocturnal oximetry BiPAP/room air in 6 months

## 2023-05-01 NOTE — Assessment & Plan Note (Signed)
Download was reviewed which shows excellent control of events on average BiPAP settings 10/6.  He is very compliant and 8 hours per night without a single missed night. BiPAP is only helped improve his daytime somnolence and fatigue.  I think it also helps him with diaphragm issues and bronchomalacia  Weight loss encouraged, compliance with goal of at least 4-6 hrs every night is the expectation. Advised against medications with sedative side effects Cautioned against driving when sleepy - understanding that sleepiness will vary on a day to day basis

## 2023-05-01 NOTE — Patient Instructions (Signed)
Continue using O2 blended into BiPAP We can recheck oxygen test prior t next appt  Trial of airfit F30 full face mask

## 2023-05-02 DIAGNOSIS — D689 Coagulation defect, unspecified: Secondary | ICD-10-CM | POA: Diagnosis not present

## 2023-05-02 DIAGNOSIS — D631 Anemia in chronic kidney disease: Secondary | ICD-10-CM | POA: Diagnosis not present

## 2023-05-02 DIAGNOSIS — E876 Hypokalemia: Secondary | ICD-10-CM | POA: Diagnosis not present

## 2023-05-02 DIAGNOSIS — N2581 Secondary hyperparathyroidism of renal origin: Secondary | ICD-10-CM | POA: Diagnosis not present

## 2023-05-02 DIAGNOSIS — Z992 Dependence on renal dialysis: Secondary | ICD-10-CM | POA: Diagnosis not present

## 2023-05-02 DIAGNOSIS — N186 End stage renal disease: Secondary | ICD-10-CM | POA: Diagnosis not present

## 2023-05-05 DIAGNOSIS — D631 Anemia in chronic kidney disease: Secondary | ICD-10-CM | POA: Diagnosis not present

## 2023-05-05 DIAGNOSIS — D689 Coagulation defect, unspecified: Secondary | ICD-10-CM | POA: Diagnosis not present

## 2023-05-05 DIAGNOSIS — N2581 Secondary hyperparathyroidism of renal origin: Secondary | ICD-10-CM | POA: Diagnosis not present

## 2023-05-05 DIAGNOSIS — Z992 Dependence on renal dialysis: Secondary | ICD-10-CM | POA: Diagnosis not present

## 2023-05-05 DIAGNOSIS — E876 Hypokalemia: Secondary | ICD-10-CM | POA: Diagnosis not present

## 2023-05-05 DIAGNOSIS — N186 End stage renal disease: Secondary | ICD-10-CM | POA: Diagnosis not present

## 2023-05-07 DIAGNOSIS — E876 Hypokalemia: Secondary | ICD-10-CM | POA: Diagnosis not present

## 2023-05-07 DIAGNOSIS — D631 Anemia in chronic kidney disease: Secondary | ICD-10-CM | POA: Diagnosis not present

## 2023-05-07 DIAGNOSIS — N2581 Secondary hyperparathyroidism of renal origin: Secondary | ICD-10-CM | POA: Diagnosis not present

## 2023-05-07 DIAGNOSIS — N186 End stage renal disease: Secondary | ICD-10-CM | POA: Diagnosis not present

## 2023-05-07 DIAGNOSIS — D689 Coagulation defect, unspecified: Secondary | ICD-10-CM | POA: Diagnosis not present

## 2023-05-07 DIAGNOSIS — Z992 Dependence on renal dialysis: Secondary | ICD-10-CM | POA: Diagnosis not present

## 2023-05-09 DIAGNOSIS — D631 Anemia in chronic kidney disease: Secondary | ICD-10-CM | POA: Diagnosis not present

## 2023-05-09 DIAGNOSIS — D689 Coagulation defect, unspecified: Secondary | ICD-10-CM | POA: Diagnosis not present

## 2023-05-09 DIAGNOSIS — N2581 Secondary hyperparathyroidism of renal origin: Secondary | ICD-10-CM | POA: Diagnosis not present

## 2023-05-09 DIAGNOSIS — E876 Hypokalemia: Secondary | ICD-10-CM | POA: Diagnosis not present

## 2023-05-09 DIAGNOSIS — N186 End stage renal disease: Secondary | ICD-10-CM | POA: Diagnosis not present

## 2023-05-09 DIAGNOSIS — Z992 Dependence on renal dialysis: Secondary | ICD-10-CM | POA: Diagnosis not present

## 2023-05-12 DIAGNOSIS — N186 End stage renal disease: Secondary | ICD-10-CM | POA: Diagnosis not present

## 2023-05-12 DIAGNOSIS — D631 Anemia in chronic kidney disease: Secondary | ICD-10-CM | POA: Diagnosis not present

## 2023-05-12 DIAGNOSIS — D689 Coagulation defect, unspecified: Secondary | ICD-10-CM | POA: Diagnosis not present

## 2023-05-12 DIAGNOSIS — E876 Hypokalemia: Secondary | ICD-10-CM | POA: Diagnosis not present

## 2023-05-12 DIAGNOSIS — N2581 Secondary hyperparathyroidism of renal origin: Secondary | ICD-10-CM | POA: Diagnosis not present

## 2023-05-12 DIAGNOSIS — Z992 Dependence on renal dialysis: Secondary | ICD-10-CM | POA: Diagnosis not present

## 2023-05-14 DIAGNOSIS — D689 Coagulation defect, unspecified: Secondary | ICD-10-CM | POA: Diagnosis not present

## 2023-05-14 DIAGNOSIS — Z992 Dependence on renal dialysis: Secondary | ICD-10-CM | POA: Diagnosis not present

## 2023-05-14 DIAGNOSIS — N186 End stage renal disease: Secondary | ICD-10-CM | POA: Diagnosis not present

## 2023-05-14 DIAGNOSIS — D631 Anemia in chronic kidney disease: Secondary | ICD-10-CM | POA: Diagnosis not present

## 2023-05-14 DIAGNOSIS — E876 Hypokalemia: Secondary | ICD-10-CM | POA: Diagnosis not present

## 2023-05-14 DIAGNOSIS — N2581 Secondary hyperparathyroidism of renal origin: Secondary | ICD-10-CM | POA: Diagnosis not present

## 2023-05-16 DIAGNOSIS — N186 End stage renal disease: Secondary | ICD-10-CM | POA: Diagnosis not present

## 2023-05-16 DIAGNOSIS — D689 Coagulation defect, unspecified: Secondary | ICD-10-CM | POA: Diagnosis not present

## 2023-05-16 DIAGNOSIS — N2581 Secondary hyperparathyroidism of renal origin: Secondary | ICD-10-CM | POA: Diagnosis not present

## 2023-05-16 DIAGNOSIS — E876 Hypokalemia: Secondary | ICD-10-CM | POA: Diagnosis not present

## 2023-05-16 DIAGNOSIS — Z992 Dependence on renal dialysis: Secondary | ICD-10-CM | POA: Diagnosis not present

## 2023-05-16 DIAGNOSIS — D631 Anemia in chronic kidney disease: Secondary | ICD-10-CM | POA: Diagnosis not present

## 2023-05-19 DIAGNOSIS — D689 Coagulation defect, unspecified: Secondary | ICD-10-CM | POA: Diagnosis not present

## 2023-05-19 DIAGNOSIS — N2581 Secondary hyperparathyroidism of renal origin: Secondary | ICD-10-CM | POA: Diagnosis not present

## 2023-05-19 DIAGNOSIS — Z992 Dependence on renal dialysis: Secondary | ICD-10-CM | POA: Diagnosis not present

## 2023-05-19 DIAGNOSIS — N186 End stage renal disease: Secondary | ICD-10-CM | POA: Diagnosis not present

## 2023-05-19 DIAGNOSIS — E876 Hypokalemia: Secondary | ICD-10-CM | POA: Diagnosis not present

## 2023-05-19 DIAGNOSIS — D631 Anemia in chronic kidney disease: Secondary | ICD-10-CM | POA: Diagnosis not present

## 2023-05-21 DIAGNOSIS — E876 Hypokalemia: Secondary | ICD-10-CM | POA: Diagnosis not present

## 2023-05-21 DIAGNOSIS — N2581 Secondary hyperparathyroidism of renal origin: Secondary | ICD-10-CM | POA: Diagnosis not present

## 2023-05-21 DIAGNOSIS — D689 Coagulation defect, unspecified: Secondary | ICD-10-CM | POA: Diagnosis not present

## 2023-05-21 DIAGNOSIS — N186 End stage renal disease: Secondary | ICD-10-CM | POA: Diagnosis not present

## 2023-05-21 DIAGNOSIS — Z992 Dependence on renal dialysis: Secondary | ICD-10-CM | POA: Diagnosis not present

## 2023-05-21 DIAGNOSIS — D631 Anemia in chronic kidney disease: Secondary | ICD-10-CM | POA: Diagnosis not present

## 2023-05-22 DIAGNOSIS — Z940 Kidney transplant status: Secondary | ICD-10-CM | POA: Diagnosis not present

## 2023-05-23 DIAGNOSIS — E876 Hypokalemia: Secondary | ICD-10-CM | POA: Diagnosis not present

## 2023-05-23 DIAGNOSIS — D631 Anemia in chronic kidney disease: Secondary | ICD-10-CM | POA: Diagnosis not present

## 2023-05-23 DIAGNOSIS — N186 End stage renal disease: Secondary | ICD-10-CM | POA: Diagnosis not present

## 2023-05-23 DIAGNOSIS — Z992 Dependence on renal dialysis: Secondary | ICD-10-CM | POA: Diagnosis not present

## 2023-05-23 DIAGNOSIS — N2581 Secondary hyperparathyroidism of renal origin: Secondary | ICD-10-CM | POA: Diagnosis not present

## 2023-05-23 DIAGNOSIS — D689 Coagulation defect, unspecified: Secondary | ICD-10-CM | POA: Diagnosis not present

## 2023-05-23 DIAGNOSIS — E1129 Type 2 diabetes mellitus with other diabetic kidney complication: Secondary | ICD-10-CM | POA: Diagnosis not present

## 2023-05-26 DIAGNOSIS — Z992 Dependence on renal dialysis: Secondary | ICD-10-CM | POA: Diagnosis not present

## 2023-05-26 DIAGNOSIS — E876 Hypokalemia: Secondary | ICD-10-CM | POA: Diagnosis not present

## 2023-05-26 DIAGNOSIS — N186 End stage renal disease: Secondary | ICD-10-CM | POA: Diagnosis not present

## 2023-05-26 DIAGNOSIS — D689 Coagulation defect, unspecified: Secondary | ICD-10-CM | POA: Diagnosis not present

## 2023-05-26 DIAGNOSIS — N2581 Secondary hyperparathyroidism of renal origin: Secondary | ICD-10-CM | POA: Diagnosis not present

## 2023-05-27 ENCOUNTER — Ambulatory Visit: Payer: Medicare Other | Admitting: Cardiology

## 2023-05-27 ENCOUNTER — Encounter: Payer: Self-pay | Admitting: Cardiology

## 2023-05-27 ENCOUNTER — Ambulatory Visit: Payer: Medicare Other | Attending: Cardiology | Admitting: Cardiology

## 2023-05-27 VITALS — BP 116/62 | HR 93 | Ht 69.0 in | Wt 172.4 lb

## 2023-05-27 DIAGNOSIS — I5032 Chronic diastolic (congestive) heart failure: Secondary | ICD-10-CM

## 2023-05-27 DIAGNOSIS — I441 Atrioventricular block, second degree: Secondary | ICD-10-CM | POA: Diagnosis not present

## 2023-05-27 DIAGNOSIS — I4510 Unspecified right bundle-branch block: Secondary | ICD-10-CM

## 2023-05-27 DIAGNOSIS — I350 Nonrheumatic aortic (valve) stenosis: Secondary | ICD-10-CM

## 2023-05-27 NOTE — Patient Instructions (Signed)
Medication Instructions:  The current medical regimen is effective;  continue present plan and medications.  *If you need a refill on your cardiac medications before your next appointment, please call your pharmacy*  Follow-Up: At [facility], you and your health needs are our priority.  As part of our continuing mission to provide you with exceptional heart care, we have created designated Provider Care Teams.  These Care Teams include your primary Cardiologist (physician) and Advanced Practice Providers (APPs -  Physician Assistants and Nurse Practitioners) who all work together to provide you with the care you need, when you need it.  We recommend signing up for the patient portal called "MyChart".  Sign up information is provided on this After Visit Summary.  MyChart is used to connect with patients for Virtual Visits (Telemedicine).  Patients are able to view lab/test results, encounter notes, upcoming appointments, etc.  Non-urgent messages can be sent to your provider as well.   To learn more about what you can do with MyChart, go to [email or web address].    Your next appointment:   6 month(s)  Provider:   Ernest Dick, NP, Michele Lenze, PA-C, Michelle Swinyer, NP, or Scott Weaver, PA-C     Then, Mark Skains, MD will plan to see you again in 1 year(s).     

## 2023-05-27 NOTE — Progress Notes (Signed)
Cardiology Office Note:    Date:  05/27/2023   ID:  Russell Bowers, DOB 05-20-1951, MRN [id number]  PCP:  Russell Floro, MD   [facility] Providers Cardiologist:  Russell Schultz, MD     Referring MD: Russell Floro, MD    History of Present Illness:    Russell Bowers is a 72 y.o. male here for the evaluation of abnormal EKG at the request of Dr. Duane Bowers.  Has mild aortic stenosis.  I had seen him last in 2018.  Had mild aortic stenosis with chronic right bundle branch block.  Prior stress test September 2012 was low risk with no evidence of ischemia with normal EF.  Has been followed closely by nephrology secondary to his kidney transplant from 2012.  Had some issues with gastric motility in the past.    Obstructive sleep apnea with weight loss assisting.  Hgb was low from Imuran. 4 PRBC 2022. Back to normal.   Denies any chest pain shortness of breath.  In early 2024 was admitted with COVID intubated respiratory failure had A-fib RVR started on Eliquis.  Concerned.  This hospitalization moved him towards hemodialysis once again. He is thinking about peritoneal dialysis.  EKG today shows sinus rhythm with second-degree heart block type I, Wenkebach   Past Medical History:  Diagnosis Date   ADHD (attention deficit hyperactivity disorder)    Allergic rhinitis    Cancer (HCC)    Prostate cancer   Cellulitis and abscess of trunk 2014   history of back abscess   Cellulitis and abscess of unspecified site 2014   lower back   Detached retina    Diabetes mellitus without complication (HCC)    Elevated troponin    Fistula    OF LEFT ARM   Gout    H/O kidney transplant    History of congestive heart failure    FOLLOWED BY DR. 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 Sherene Sires   Hypertension    Mild concentric left ventricular  hypertrophy (LVH)    AND AORTIC STENOSIS FOLLOWED IN THE PAST BY CARDIOLOGIST DR. Anne Fu.   OSA treated with BiPAP    Personal history of colonic polyps    Prostate cancer (HCC) 11/2010   PROSTATECTOMY WITH UROLOGIST DR. DAVIS   Renal failure    Shortness of breath    Sleep disorder breathing    FOLLOWED BY PULMONOLOGIST DR. Vassie Loll    Past Surgical History:  Procedure Laterality Date   CATARACT EXTRACTION, BILATERAL  11/2014   COLONOSCOPY  09/2014   KIDNEY TRANSPLANT  12/01/2011   PROSTATECTOMY     FOR PROSTATE CANCER   prostectomy  11/2010   RETINAL DETACHMENT SURGERY  06/2002    Current Medications: Current Meds  Medication Sig   allopurinol (ZYLOPRIM) 100 MG tablet Take 100 mg by mouth daily.   apixaban (ELIQUIS) 2.5 MG TABS tablet Take 2.5 mg by mouth 2 (two) times daily.   B-D ULTRAFINE III SHORT PEN 31G X 8 MM MISC Inject into the skin daily.   calcitRIOL (ROCALTROL) 0.25 MCG capsule Take 0.25 mcg by mouth daily.   cinacalcet (SENSIPAR) 30 MG tablet Take 30 mg by mouth every other day.   docusate sodium (COLACE) 100 MG capsule Take 100 mg by mouth daily.   famotidine (PEPCID) 20 MG tablet Take 20 mg by mouth daily.  folic acid (FOLVITE) 1 MG tablet TAKE 2 TABLETS BY MOUTH EVERY DAY   insulin glargine (LANTUS) 100 UNIT/ML injection Inject 0.1 mLs (10 Units total) into the skin at bedtime.   loperamide (IMODIUM) 2 MG capsule Take 2 mg by mouth as needed for diarrhea or loose stools.   loratadine (CLARITIN) 10 MG tablet Take 10 mg by mouth daily.   Magnesium Cl-Calcium Carbonate (SLOW-MAG PO) Take 1 tablet by mouth daily.    metoprolol tartrate (LOPRESSOR) 25 MG tablet Take 25 mg by mouth 2 (two) times daily.   ONETOUCH ULTRA test strip 3 (three) times daily.   pravastatin (PRAVACHOL) 10 MG tablet Take 10 mg by mouth at bedtime.    tacrolimus (PROGRAF) 0.5 MG capsule Place 4 capsules (2 mg total) under the tongue 2 (two) times daily.   vitamin B-12 (CYANOCOBALAMIN) 500 MCG  tablet Take 1,000 mcg by mouth at bedtime.     Allergies:   Azathioprine, Phenergan [promethazine hcl], and Pollen extract   Social History   Socioeconomic History   Marital status: Married    Spouse name: Not on file   Number of children: Not on file   Years of education: Not on file   Highest education level: Not on file  Occupational History   Occupation: Retired  Tobacco Use   Smoking status: Former    Packs/day: 1.00    Years: 21.00    Additional pack years: 0.00    Total pack years: 21.00    Types: Cigarettes    Quit date: 03/16/1993    Years since quitting: 30.2   Smokeless tobacco: Never  Vaping Use   Vaping Use: Never used  Substance and Sexual Activity   Alcohol use: Yes    Alcohol/week: 1.0 - 2.0 standard drink of alcohol    Types: 1 - 2 Glasses of wine per week   Drug use: No   Sexual activity: Not on file  Other Topics Concern   Not on file  Social History Narrative   Not on file   Social Determinants of Health   Financial Resource Strain: Not on file  Food Insecurity: No Food Insecurity (01/25/2023)   Hunger Vital Sign    Worried About Running Out of Food in the Last Year: Never true    Ran Out of Food in the Last Year: Never true  Transportation Needs: No Transportation Needs (01/25/2023)   PRAPARE - Administrator, Civil Service (Medical): No    Lack of Transportation (Non-Medical): No  Physical Activity: Not on file  Stress: Not on file  Social Connections: Not on file     Family History: The patient's family history includes Alzheimer's disease in his father; Atrial fibrillation in his mother; Diabetes in his mother; Heart Problems in his sister; Heart disease in his maternal grandfather and maternal grandmother; Hypertension in his mother.  ROS:   Please see the history of present illness.     All other systems reviewed and are negative.  EKGs/Labs/Other Studies Reviewed:    The following studies were reviewed today: ECHO:  04/12/13: - Left ventricle: The cavity size was normal. There was mild   focal basal hypertrophy of the septum. Systolic function   was normal. The estimated ejection fraction was in the   range of 60% to 65%. Although no diagnostic regional wall   motion abnormality was identified, this possibility cannot   be completely excluded on the basis of this study. - Aortic valve: There was mild stenosis.  Trivial   regurgitation. Valve area: 2.32cm^2(VTI). Valve area:   1.54cm^2 (Vmax). - Mitral valve: Calcified annulus. Mildly calcified leaflets   . Mild regurgitation. - Left atrium: The atrium was mildly dilated.   ECHO 2022:   1. Left ventricular ejection fraction, by estimation, is 60 to 65%. The  left ventricle has normal function. The left ventricle has no regional  wall motion abnormalities. There is mild left ventricular hypertrophy.  Left ventricular diastolic parameters  were normal.   2. There is flattening of the ventricular septum in systole consistent  with RV pressure overload. . Right ventricular systolic function is  normal. The right ventricular size is normal.   3. Left atrial size was moderately dilated.   4. The mitral valve is normal in structure. Trivial mitral valve  regurgitation. No evidence of mitral stenosis.   5. The aortic valve has an indeterminant number of cusps. There is  moderate calcification of the aortic valve. There is moderate thickening  of the aortic valve. Aortic valve regurgitation is mild. Mild aortic valve  stenosis.   EKG:  EKG is  ordered today.  The ekg ordered today demonstrates sinus rhythm 75 early R wave transition in V2.  Borderline first-degree AV block with 202 ms.  Previously had right bundle branch block on ECG.  Recent Labs: 01/19/2023: TSH 0.311 01/21/2023: ALT 26; B Natriuretic Peptide 1,286.5 01/29/2023: Hemoglobin 11.5; Magnesium 1.8; Platelets 71 01/30/2023: BUN 35; Creatinine, Ser 3.61; Potassium 3.9; Sodium 133  Recent Lipid  Panel No results found for: "CHOL", "TRIG", "HDL", "CHOLHDL", "VLDL", "LDLCALC", "LDLDIRECT"   Risk Assessment/Calculations:              Physical Exam:    VS:  BP 116/62   Pulse 93   Ht 5\' 9"  (1.753 m)   Wt 172 lb 6.4 oz (78.2 kg)   SpO2 96%   BMI 25.46 kg/m     Wt Readings from Last 3 Encounters:  05/27/23 172 lb 6.4 oz (78.2 kg)  05/01/23 173 lb (78.5 kg)  02/20/23 172 lb (78 kg)     GEN:  Well nourished, well developed in no acute distress HEENT: Normal NECK: No JVD; No carotid bruits LYMPHATICS: No lymphadenopathy CARDIAC: RRR, 2/6 murmurs, no rubs, gallops RESPIRATORY:  Clear to auscultation without rales, wheezing or rhonchi  ABDOMEN: Soft, non-tender, non-distended MUSCULOSKELETAL:  No edema; No deformity  SKIN: Warm and dry NEUROLOGIC:  Alert and oriented x 3 PSYCHIATRIC:  Normal affect   ASSESSMENT:    1. Wenckebach   2. Chronic diastolic heart failure (HCC)   3. Nonrheumatic aortic valve stenosis   4. RBBB     PLAN:    In order of problems listed above:  No problem-specific Assessment & Plan notes found for this encounter.          Medication Adjustments/Labs and Tests Ordered: Current medicines are reviewed at length with the patient today.  Concerns regarding medicines are outlined above.  Orders Placed This Encounter  Procedures   EKG 12-Lead   No orders of the defined types were placed in this encounter.   Patient Instructions  Medication Instructions:  The current medical regimen is effective;  continue present plan and medications.  *If you need a refill on your cardiac medications before your next appointment, please call your pharmacy*   Follow-Up: At [facility], you and your health needs are our priority.  As part of our continuing mission to provide you with exceptional heart care, we have  created designated Provider Care Teams.  These Care Teams include your primary Cardiologist (physician) and Advanced  Practice Providers (APPs -  Physician Assistants and Nurse Practitioners) who all work together to provide you with the care you need, when you need it.  We recommend signing up for the patient portal called "MyChart".  Sign up information is provided on this After Visit Summary.  MyChart is used to connect with patients for Virtual Visits (Telemedicine).  Patients are able to view lab/test results, encounter notes, upcoming appointments, etc.  Non-urgent messages can be sent to your provider as well.   To learn more about what you can do with MyChart, go to [email or web address].    Your next appointment:   6 month(s)  Provider:   Robin Searing, NP, Jacolyn Reedy, PA-C, Eligha Bridegroom, NP, or Tereso Newcomer, PA-C      Then, Russell Schultz, MD will plan to see you again in 1 year(s).       Signed, Russell Schultz, MD  05/27/2023 11:39 AM    [facility] Cardiology Office Note:    Date:  05/27/2023   ID:  Deberah Pelton, DOB 12-31-1950, MRN [id number]  PCP:  Russell Floro, MD   [facility] Providers Cardiologist:  Russell Schultz, MD     Referring MD: Russell Floro, MD    History of Present Illness:    Russell Bowers is a 72 y.o. male here for the evaluation of abnormal EKG at the request of Dr. Duane Bowers.  Has mild aortic stenosis.  I had seen him last in 2018.  Had mild aortic stenosis with chronic right bundle branch block.  Prior stress test September 2012 was low risk with no evidence of ischemia with normal EF.  Has been followed closely by nephrology secondary to his kidney transplant from 2012.  Had some issues with gastric motility in the past.  Currently chronic kidney disease stage IV.  He is worried about this.  He is worried about going back on dialysis.  Obstructive sleep apnea with weight loss assisting.  Hgb was low from Imuran. 4 PRBC. Back to normal.   Denies any chest pain shortness of breath.  Past Medical History:  Diagnosis  Date   ADHD (attention deficit hyperactivity disorder)    Allergic rhinitis    Cancer (HCC)    Prostate cancer   Cellulitis and abscess of trunk 2014   history of back abscess   Cellulitis and abscess of unspecified site 2014   lower back   Detached retina    Diabetes mellitus without complication (HCC)    Elevated troponin    Fistula    OF LEFT ARM   Gout    H/O kidney transplant    History of congestive heart failure    FOLLOWED BY DR. 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 Sherene Sires   Hypertension    Mild concentric left ventricular hypertrophy (LVH)    AND AORTIC STENOSIS FOLLOWED IN THE PAST BY CARDIOLOGIST DR. Anne Fu.   OSA treated with BiPAP    Personal history of colonic polyps    Prostate cancer (HCC) 11/2010   PROSTATECTOMY WITH UROLOGIST DR. DAVIS   Renal failure    Shortness of breath    Sleep disorder breathing    FOLLOWED BY PULMONOLOGIST DR. Vassie Loll    Past Surgical History:  Procedure Laterality Date  CATARACT EXTRACTION, BILATERAL  11/2014   COLONOSCOPY  09/2014   KIDNEY TRANSPLANT  12/01/2011   PROSTATECTOMY     FOR PROSTATE CANCER   prostectomy  11/2010   RETINAL DETACHMENT SURGERY  06/2002    Current Medications: Current Meds  Medication Sig   allopurinol (ZYLOPRIM) 100 MG tablet Take 100 mg by mouth daily.   apixaban (ELIQUIS) 2.5 MG TABS tablet Take 2.5 mg by mouth 2 (two) times daily.   B-D ULTRAFINE III SHORT PEN 31G X 8 MM MISC Inject into the skin daily.   calcitRIOL (ROCALTROL) 0.25 MCG capsule Take 0.25 mcg by mouth daily.   cinacalcet (SENSIPAR) 30 MG tablet Take 30 mg by mouth every other day.   docusate sodium (COLACE) 100 MG capsule Take 100 mg by mouth daily.   famotidine (PEPCID) 20 MG tablet Take 20 mg by mouth daily.   folic acid (FOLVITE) 1 MG tablet TAKE 2 TABLETS BY MOUTH EVERY DAY   insulin  glargine (LANTUS) 100 UNIT/ML injection Inject 0.1 mLs (10 Units total) into the skin at bedtime.   loperamide (IMODIUM) 2 MG capsule Take 2 mg by mouth as needed for diarrhea or loose stools.   loratadine (CLARITIN) 10 MG tablet Take 10 mg by mouth daily.   Magnesium Cl-Calcium Carbonate (SLOW-MAG PO) Take 1 tablet by mouth daily.    metoprolol tartrate (LOPRESSOR) 25 MG tablet Take 25 mg by mouth 2 (two) times daily.   ONETOUCH ULTRA test strip 3 (three) times daily.   pravastatin (PRAVACHOL) 10 MG tablet Take 10 mg by mouth at bedtime.    tacrolimus (PROGRAF) 0.5 MG capsule Place 4 capsules (2 mg total) under the tongue 2 (two) times daily.   vitamin B-12 (CYANOCOBALAMIN) 500 MCG tablet Take 1,000 mcg by mouth at bedtime.     Allergies:   Azathioprine, Phenergan [promethazine hcl], and Pollen extract   Social History   Socioeconomic History   Marital status: Married    Spouse name: Not on file   Number of children: Not on file   Years of education: Not on file   Highest education level: Not on file  Occupational History   Occupation: Retired  Tobacco Use   Smoking status: Former    Packs/day: 1.00    Years: 21.00    Additional pack years: 0.00    Total pack years: 21.00    Types: Cigarettes    Quit date: 03/16/1993    Years since quitting: 30.2   Smokeless tobacco: Never  Vaping Use   Vaping Use: Never used  Substance and Sexual Activity   Alcohol use: Yes    Alcohol/week: 1.0 - 2.0 standard drink of alcohol    Types: 1 - 2 Glasses of wine per week   Drug use: No   Sexual activity: Not on file  Other Topics Concern   Not on file  Social History Narrative   Not on file   Social Determinants of Health   Financial Resource Strain: Not on file  Food Insecurity: No Food Insecurity (01/25/2023)   Hunger Vital Sign    Worried About Running Out of Food in the Last Year: Never true    Ran Out of Food in the Last Year: Never true  Transportation Needs: No Transportation  Needs (01/25/2023)   PRAPARE - Administrator, Civil Service (Medical): No    Lack of Transportation (Non-Medical): No  Physical Activity: Not on file  Stress: Not on file  Social Connections: Not on  file     Family History: The patient's family history includes Alzheimer's disease in his father; Atrial fibrillation in his mother; Diabetes in his mother; Heart Problems in his sister; Heart disease in his maternal grandfather and maternal grandmother; Hypertension in his mother.  ROS:   Please see the history of present illness.     All other systems reviewed and are negative.  EKGs/Labs/Other Studies Reviewed:    The following studies were reviewed today: ECHO: 04/12/13: - Left ventricle: The cavity size was normal. There was mild   focal basal hypertrophy of the septum. Systolic function   was normal. The estimated ejection fraction was in the   range of 60% to 65%. Although no diagnostic regional wall   motion abnormality was identified, this possibility cannot   be completely excluded on the basis of this study. - Aortic valve: There was mild stenosis. Trivial   regurgitation. Valve area: 2.32cm^2(VTI). Valve area:   1.54cm^2 (Vmax). - Mitral valve: Calcified annulus. Mildly calcified leaflets   . Mild regurgitation. - Left atrium: The atrium was mildly dilated.   ECHO 2022:   1. Left ventricular ejection fraction, by estimation, is 60 to 65%. The  left ventricle has normal function. The left ventricle has no regional  wall motion abnormalities. There is mild left ventricular hypertrophy.  Left ventricular diastolic parameters  were normal.   2. There is flattening of the ventricular septum in systole consistent  with RV pressure overload. . Right ventricular systolic function is  normal. The right ventricular size is normal.   3. Left atrial size was moderately dilated.   4. The mitral valve is normal in structure. Trivial mitral valve  regurgitation. No  evidence of mitral stenosis.   5. The aortic valve has an indeterminant number of cusps. There is  moderate calcification of the aortic valve. There is moderate thickening  of the aortic valve. Aortic valve regurgitation is mild. Mild aortic valve  stenosis.   EKG:  EKG is  ordered today.  The ekg ordered today demonstrates sinus rhythm 75 early R wave transition in V2.  Borderline first-degree AV block with 202 ms.  Previously had right bundle branch block on ECG.  Recent Labs: 01/19/2023: TSH 0.311 01/21/2023: ALT 26; B Natriuretic Peptide 1,286.5 01/29/2023: Hemoglobin 11.5; Magnesium 1.8; Platelets 71 01/30/2023: BUN 35; Creatinine, Ser 3.61; Potassium 3.9; Sodium 133  Recent Lipid Panel No results found for: "CHOL", "TRIG", "HDL", "CHOLHDL", "VLDL", "LDLCALC", "LDLDIRECT"   Risk Assessment/Calculations:              Physical Exam:    VS:  BP 116/62   Pulse 93   Ht 5\' 9"  (1.753 m)   Wt 172 lb 6.4 oz (78.2 kg)   SpO2 96%   BMI 25.46 kg/m     Wt Readings from Last 3 Encounters:  05/27/23 172 lb 6.4 oz (78.2 kg)  05/01/23 173 lb (78.5 kg)  02/20/23 172 lb (78 kg)     GEN:  Well nourished, well developed in no acute distress HEENT: Normal NECK: No JVD; No carotid bruits LYMPHATICS: No lymphadenopathy CARDIAC: RRR, 2/6 murmurs, no rubs, gallops RESPIRATORY:  Clear to auscultation without rales, wheezing or rhonchi  ABDOMEN: Soft, non-tender, non-distended MUSCULOSKELETAL:  No edema; No deformity  SKIN: Warm and dry NEUROLOGIC:  Alert and oriented x 3 PSYCHIATRIC:  Normal affect   ASSESSMENT:    1. Wenckebach   2. Chronic diastolic heart failure (HCC)   3. Nonrheumatic aortic valve stenosis  4. RBBB     PLAN:    In order of problems listed above:   Aortic stenosis Mild aortic stenosis has been stable from 2014.    RBBB Previously noted on EKG.  Today's EKG does not show right bundle branch block.  This may be intermittent.  Benign.  No symptoms such as  syncope.  OSA (obstructive sleep apnea) Continue with treatment strategy.  History of renal transplantation Currently chronic kidney disease stage V.  Transplant lasted 11 years. Nephrology is closely monitoring.   PAF 01/30/23 AFIB surrounding COVID, on ESRD HD.  Taking Eliquis 2.5 twice a day dose reduced. We had lengthy discussion today about Eliquis.  Atrial fibrillation could return.  We would like him to be protected to help reduce stroke risk.  Understands.  Obviously of bleeding significant occurs, we may need to stop the Eliquis.  Currently he is in sinus rhythm with second-degree heart block type I.  No changes made.  Continue to monitor.  Watch for any signs of worsening conduction disorder.  For now heart rate is normal.      Medication Adjustments/Labs and Tests Ordered: Current medicines are reviewed at length with the patient today.  Concerns regarding medicines are outlined above.  Orders Placed This Encounter  Procedures   EKG 12-Lead   No orders of the defined types were placed in this encounter.   Patient Instructions  Medication Instructions:  The current medical regimen is effective;  continue present plan and medications.  *If you need a refill on your cardiac medications before your next appointment, please call your pharmacy*   Follow-Up: At [facility], you and your health needs are our priority.  As part of our continuing mission to provide you with exceptional heart care, we have created designated Provider Care Teams.  These Care Teams include your primary Cardiologist (physician) and Advanced Practice Providers (APPs -  Physician Assistants and Nurse Practitioners) who all work together to provide you with the care you need, when you need it.  We recommend signing up for the patient portal called "MyChart".  Sign up information is provided on this After Visit Summary.  MyChart is used to connect with patients for Virtual Visits (Telemedicine).   Patients are able to view lab/test results, encounter notes, upcoming appointments, etc.  Non-urgent messages can be sent to your provider as well.   To learn more about what you can do with MyChart, go to [email or web address].    Your next appointment:   6 month(s)  Provider:   Robin Searing, NP, Jacolyn Reedy, PA-C, Eligha Bridegroom, NP, or Tereso Newcomer, PA-C      Then, Russell Schultz, MD will plan to see you again in 1 year(s).       Signed, Russell Schultz, MD  05/27/2023 11:39 AM    [facility]

## 2023-05-28 DIAGNOSIS — E876 Hypokalemia: Secondary | ICD-10-CM | POA: Diagnosis not present

## 2023-05-28 DIAGNOSIS — D689 Coagulation defect, unspecified: Secondary | ICD-10-CM | POA: Diagnosis not present

## 2023-05-28 DIAGNOSIS — Z992 Dependence on renal dialysis: Secondary | ICD-10-CM | POA: Diagnosis not present

## 2023-05-28 DIAGNOSIS — N186 End stage renal disease: Secondary | ICD-10-CM | POA: Diagnosis not present

## 2023-05-28 DIAGNOSIS — N2581 Secondary hyperparathyroidism of renal origin: Secondary | ICD-10-CM | POA: Diagnosis not present

## 2023-05-30 DIAGNOSIS — D689 Coagulation defect, unspecified: Secondary | ICD-10-CM | POA: Diagnosis not present

## 2023-05-30 DIAGNOSIS — N2581 Secondary hyperparathyroidism of renal origin: Secondary | ICD-10-CM | POA: Diagnosis not present

## 2023-05-30 DIAGNOSIS — Z992 Dependence on renal dialysis: Secondary | ICD-10-CM | POA: Diagnosis not present

## 2023-05-30 DIAGNOSIS — N186 End stage renal disease: Secondary | ICD-10-CM | POA: Diagnosis not present

## 2023-05-30 DIAGNOSIS — E876 Hypokalemia: Secondary | ICD-10-CM | POA: Diagnosis not present

## 2023-05-31 DIAGNOSIS — R6521 Severe sepsis with septic shock: Secondary | ICD-10-CM | POA: Diagnosis not present

## 2023-05-31 DIAGNOSIS — I4891 Unspecified atrial fibrillation: Secondary | ICD-10-CM | POA: Diagnosis not present

## 2023-05-31 DIAGNOSIS — E440 Moderate protein-calorie malnutrition: Secondary | ICD-10-CM | POA: Diagnosis not present

## 2023-05-31 DIAGNOSIS — A419 Sepsis, unspecified organism: Secondary | ICD-10-CM | POA: Diagnosis not present

## 2023-05-31 DIAGNOSIS — N186 End stage renal disease: Secondary | ICD-10-CM | POA: Diagnosis not present

## 2023-06-02 DIAGNOSIS — N186 End stage renal disease: Secondary | ICD-10-CM | POA: Diagnosis not present

## 2023-06-02 DIAGNOSIS — Z992 Dependence on renal dialysis: Secondary | ICD-10-CM | POA: Diagnosis not present

## 2023-06-02 DIAGNOSIS — N2581 Secondary hyperparathyroidism of renal origin: Secondary | ICD-10-CM | POA: Diagnosis not present

## 2023-06-02 DIAGNOSIS — E876 Hypokalemia: Secondary | ICD-10-CM | POA: Diagnosis not present

## 2023-06-02 DIAGNOSIS — D689 Coagulation defect, unspecified: Secondary | ICD-10-CM | POA: Diagnosis not present

## 2023-06-03 DIAGNOSIS — L821 Other seborrheic keratosis: Secondary | ICD-10-CM | POA: Diagnosis not present

## 2023-06-03 DIAGNOSIS — Z85828 Personal history of other malignant neoplasm of skin: Secondary | ICD-10-CM | POA: Diagnosis not present

## 2023-06-03 DIAGNOSIS — L281 Prurigo nodularis: Secondary | ICD-10-CM | POA: Diagnosis not present

## 2023-06-03 DIAGNOSIS — D044 Carcinoma in situ of skin of scalp and neck: Secondary | ICD-10-CM | POA: Diagnosis not present

## 2023-06-03 DIAGNOSIS — L570 Actinic keratosis: Secondary | ICD-10-CM | POA: Diagnosis not present

## 2023-06-03 DIAGNOSIS — L814 Other melanin hyperpigmentation: Secondary | ICD-10-CM | POA: Diagnosis not present

## 2023-06-03 DIAGNOSIS — D485 Neoplasm of uncertain behavior of skin: Secondary | ICD-10-CM | POA: Diagnosis not present

## 2023-06-04 DIAGNOSIS — Z992 Dependence on renal dialysis: Secondary | ICD-10-CM | POA: Diagnosis not present

## 2023-06-04 DIAGNOSIS — D689 Coagulation defect, unspecified: Secondary | ICD-10-CM | POA: Diagnosis not present

## 2023-06-04 DIAGNOSIS — N186 End stage renal disease: Secondary | ICD-10-CM | POA: Diagnosis not present

## 2023-06-04 DIAGNOSIS — E876 Hypokalemia: Secondary | ICD-10-CM | POA: Diagnosis not present

## 2023-06-04 DIAGNOSIS — N2581 Secondary hyperparathyroidism of renal origin: Secondary | ICD-10-CM | POA: Diagnosis not present

## 2023-06-06 DIAGNOSIS — N2581 Secondary hyperparathyroidism of renal origin: Secondary | ICD-10-CM | POA: Diagnosis not present

## 2023-06-06 DIAGNOSIS — Z992 Dependence on renal dialysis: Secondary | ICD-10-CM | POA: Diagnosis not present

## 2023-06-06 DIAGNOSIS — N186 End stage renal disease: Secondary | ICD-10-CM | POA: Diagnosis not present

## 2023-06-06 DIAGNOSIS — D689 Coagulation defect, unspecified: Secondary | ICD-10-CM | POA: Diagnosis not present

## 2023-06-06 DIAGNOSIS — E876 Hypokalemia: Secondary | ICD-10-CM | POA: Diagnosis not present

## 2023-06-09 DIAGNOSIS — E876 Hypokalemia: Secondary | ICD-10-CM | POA: Diagnosis not present

## 2023-06-09 DIAGNOSIS — Z992 Dependence on renal dialysis: Secondary | ICD-10-CM | POA: Diagnosis not present

## 2023-06-09 DIAGNOSIS — D689 Coagulation defect, unspecified: Secondary | ICD-10-CM | POA: Diagnosis not present

## 2023-06-09 DIAGNOSIS — N186 End stage renal disease: Secondary | ICD-10-CM | POA: Diagnosis not present

## 2023-06-09 DIAGNOSIS — N2581 Secondary hyperparathyroidism of renal origin: Secondary | ICD-10-CM | POA: Diagnosis not present

## 2023-06-11 DIAGNOSIS — N2581 Secondary hyperparathyroidism of renal origin: Secondary | ICD-10-CM | POA: Diagnosis not present

## 2023-06-11 DIAGNOSIS — D689 Coagulation defect, unspecified: Secondary | ICD-10-CM | POA: Diagnosis not present

## 2023-06-11 DIAGNOSIS — N186 End stage renal disease: Secondary | ICD-10-CM | POA: Diagnosis not present

## 2023-06-11 DIAGNOSIS — E876 Hypokalemia: Secondary | ICD-10-CM | POA: Diagnosis not present

## 2023-06-11 DIAGNOSIS — Z992 Dependence on renal dialysis: Secondary | ICD-10-CM | POA: Diagnosis not present

## 2023-06-12 DIAGNOSIS — J9610 Chronic respiratory failure, unspecified whether with hypoxia or hypercapnia: Secondary | ICD-10-CM | POA: Diagnosis not present

## 2023-06-12 DIAGNOSIS — G4733 Obstructive sleep apnea (adult) (pediatric): Secondary | ICD-10-CM | POA: Diagnosis not present

## 2023-06-13 DIAGNOSIS — E876 Hypokalemia: Secondary | ICD-10-CM | POA: Diagnosis not present

## 2023-06-13 DIAGNOSIS — Z992 Dependence on renal dialysis: Secondary | ICD-10-CM | POA: Diagnosis not present

## 2023-06-13 DIAGNOSIS — D689 Coagulation defect, unspecified: Secondary | ICD-10-CM | POA: Diagnosis not present

## 2023-06-13 DIAGNOSIS — N2581 Secondary hyperparathyroidism of renal origin: Secondary | ICD-10-CM | POA: Diagnosis not present

## 2023-06-13 DIAGNOSIS — N186 End stage renal disease: Secondary | ICD-10-CM | POA: Diagnosis not present

## 2023-06-16 DIAGNOSIS — N186 End stage renal disease: Secondary | ICD-10-CM | POA: Diagnosis not present

## 2023-06-16 DIAGNOSIS — Z992 Dependence on renal dialysis: Secondary | ICD-10-CM | POA: Diagnosis not present

## 2023-06-16 DIAGNOSIS — N2581 Secondary hyperparathyroidism of renal origin: Secondary | ICD-10-CM | POA: Diagnosis not present

## 2023-06-16 DIAGNOSIS — D689 Coagulation defect, unspecified: Secondary | ICD-10-CM | POA: Diagnosis not present

## 2023-06-16 DIAGNOSIS — E876 Hypokalemia: Secondary | ICD-10-CM | POA: Diagnosis not present

## 2023-06-18 DIAGNOSIS — N2581 Secondary hyperparathyroidism of renal origin: Secondary | ICD-10-CM | POA: Diagnosis not present

## 2023-06-18 DIAGNOSIS — E876 Hypokalemia: Secondary | ICD-10-CM | POA: Diagnosis not present

## 2023-06-18 DIAGNOSIS — D689 Coagulation defect, unspecified: Secondary | ICD-10-CM | POA: Diagnosis not present

## 2023-06-18 DIAGNOSIS — Z992 Dependence on renal dialysis: Secondary | ICD-10-CM | POA: Diagnosis not present

## 2023-06-18 DIAGNOSIS — N186 End stage renal disease: Secondary | ICD-10-CM | POA: Diagnosis not present

## 2023-06-20 DIAGNOSIS — Z992 Dependence on renal dialysis: Secondary | ICD-10-CM | POA: Diagnosis not present

## 2023-06-20 DIAGNOSIS — N2581 Secondary hyperparathyroidism of renal origin: Secondary | ICD-10-CM | POA: Diagnosis not present

## 2023-06-20 DIAGNOSIS — D689 Coagulation defect, unspecified: Secondary | ICD-10-CM | POA: Diagnosis not present

## 2023-06-20 DIAGNOSIS — E876 Hypokalemia: Secondary | ICD-10-CM | POA: Diagnosis not present

## 2023-06-20 DIAGNOSIS — N186 End stage renal disease: Secondary | ICD-10-CM | POA: Diagnosis not present

## 2023-06-22 DIAGNOSIS — N186 End stage renal disease: Secondary | ICD-10-CM | POA: Diagnosis not present

## 2023-06-22 DIAGNOSIS — Z992 Dependence on renal dialysis: Secondary | ICD-10-CM | POA: Diagnosis not present

## 2023-06-22 DIAGNOSIS — E1129 Type 2 diabetes mellitus with other diabetic kidney complication: Secondary | ICD-10-CM | POA: Diagnosis not present

## 2023-06-23 DIAGNOSIS — E876 Hypokalemia: Secondary | ICD-10-CM | POA: Diagnosis not present

## 2023-06-23 DIAGNOSIS — D689 Coagulation defect, unspecified: Secondary | ICD-10-CM | POA: Diagnosis not present

## 2023-06-23 DIAGNOSIS — N2581 Secondary hyperparathyroidism of renal origin: Secondary | ICD-10-CM | POA: Diagnosis not present

## 2023-06-23 DIAGNOSIS — Z992 Dependence on renal dialysis: Secondary | ICD-10-CM | POA: Diagnosis not present

## 2023-06-23 DIAGNOSIS — N186 End stage renal disease: Secondary | ICD-10-CM | POA: Diagnosis not present

## 2023-06-25 DIAGNOSIS — Z992 Dependence on renal dialysis: Secondary | ICD-10-CM | POA: Diagnosis not present

## 2023-06-25 DIAGNOSIS — E876 Hypokalemia: Secondary | ICD-10-CM | POA: Diagnosis not present

## 2023-06-25 DIAGNOSIS — N186 End stage renal disease: Secondary | ICD-10-CM | POA: Diagnosis not present

## 2023-06-25 DIAGNOSIS — D689 Coagulation defect, unspecified: Secondary | ICD-10-CM | POA: Diagnosis not present

## 2023-06-25 DIAGNOSIS — N2581 Secondary hyperparathyroidism of renal origin: Secondary | ICD-10-CM | POA: Diagnosis not present

## 2023-06-27 DIAGNOSIS — E876 Hypokalemia: Secondary | ICD-10-CM | POA: Diagnosis not present

## 2023-06-27 DIAGNOSIS — Z992 Dependence on renal dialysis: Secondary | ICD-10-CM | POA: Diagnosis not present

## 2023-06-27 DIAGNOSIS — N186 End stage renal disease: Secondary | ICD-10-CM | POA: Diagnosis not present

## 2023-06-27 DIAGNOSIS — N2581 Secondary hyperparathyroidism of renal origin: Secondary | ICD-10-CM | POA: Diagnosis not present

## 2023-06-27 DIAGNOSIS — D689 Coagulation defect, unspecified: Secondary | ICD-10-CM | POA: Diagnosis not present

## 2023-06-30 DIAGNOSIS — E440 Moderate protein-calorie malnutrition: Secondary | ICD-10-CM | POA: Diagnosis not present

## 2023-06-30 DIAGNOSIS — Z992 Dependence on renal dialysis: Secondary | ICD-10-CM | POA: Diagnosis not present

## 2023-06-30 DIAGNOSIS — E876 Hypokalemia: Secondary | ICD-10-CM | POA: Diagnosis not present

## 2023-06-30 DIAGNOSIS — I4891 Unspecified atrial fibrillation: Secondary | ICD-10-CM | POA: Diagnosis not present

## 2023-06-30 DIAGNOSIS — N186 End stage renal disease: Secondary | ICD-10-CM | POA: Diagnosis not present

## 2023-06-30 DIAGNOSIS — A419 Sepsis, unspecified organism: Secondary | ICD-10-CM | POA: Diagnosis not present

## 2023-06-30 DIAGNOSIS — N2581 Secondary hyperparathyroidism of renal origin: Secondary | ICD-10-CM | POA: Diagnosis not present

## 2023-06-30 DIAGNOSIS — R6521 Severe sepsis with septic shock: Secondary | ICD-10-CM | POA: Diagnosis not present

## 2023-06-30 DIAGNOSIS — D689 Coagulation defect, unspecified: Secondary | ICD-10-CM | POA: Diagnosis not present

## 2023-07-02 DIAGNOSIS — E876 Hypokalemia: Secondary | ICD-10-CM | POA: Diagnosis not present

## 2023-07-02 DIAGNOSIS — D689 Coagulation defect, unspecified: Secondary | ICD-10-CM | POA: Diagnosis not present

## 2023-07-02 DIAGNOSIS — Z992 Dependence on renal dialysis: Secondary | ICD-10-CM | POA: Diagnosis not present

## 2023-07-02 DIAGNOSIS — N186 End stage renal disease: Secondary | ICD-10-CM | POA: Diagnosis not present

## 2023-07-02 DIAGNOSIS — N2581 Secondary hyperparathyroidism of renal origin: Secondary | ICD-10-CM | POA: Diagnosis not present

## 2023-07-04 DIAGNOSIS — E876 Hypokalemia: Secondary | ICD-10-CM | POA: Diagnosis not present

## 2023-07-04 DIAGNOSIS — D689 Coagulation defect, unspecified: Secondary | ICD-10-CM | POA: Diagnosis not present

## 2023-07-04 DIAGNOSIS — N2581 Secondary hyperparathyroidism of renal origin: Secondary | ICD-10-CM | POA: Diagnosis not present

## 2023-07-04 DIAGNOSIS — N186 End stage renal disease: Secondary | ICD-10-CM | POA: Diagnosis not present

## 2023-07-04 DIAGNOSIS — Z992 Dependence on renal dialysis: Secondary | ICD-10-CM | POA: Diagnosis not present

## 2023-07-07 DIAGNOSIS — N186 End stage renal disease: Secondary | ICD-10-CM | POA: Diagnosis not present

## 2023-07-07 DIAGNOSIS — E876 Hypokalemia: Secondary | ICD-10-CM | POA: Diagnosis not present

## 2023-07-07 DIAGNOSIS — D689 Coagulation defect, unspecified: Secondary | ICD-10-CM | POA: Diagnosis not present

## 2023-07-07 DIAGNOSIS — N2581 Secondary hyperparathyroidism of renal origin: Secondary | ICD-10-CM | POA: Diagnosis not present

## 2023-07-07 DIAGNOSIS — Z992 Dependence on renal dialysis: Secondary | ICD-10-CM | POA: Diagnosis not present

## 2023-07-09 DIAGNOSIS — N186 End stage renal disease: Secondary | ICD-10-CM | POA: Diagnosis not present

## 2023-07-09 DIAGNOSIS — E876 Hypokalemia: Secondary | ICD-10-CM | POA: Diagnosis not present

## 2023-07-09 DIAGNOSIS — Z992 Dependence on renal dialysis: Secondary | ICD-10-CM | POA: Diagnosis not present

## 2023-07-09 DIAGNOSIS — N2581 Secondary hyperparathyroidism of renal origin: Secondary | ICD-10-CM | POA: Diagnosis not present

## 2023-07-09 DIAGNOSIS — D689 Coagulation defect, unspecified: Secondary | ICD-10-CM | POA: Diagnosis not present

## 2023-07-11 DIAGNOSIS — N2581 Secondary hyperparathyroidism of renal origin: Secondary | ICD-10-CM | POA: Diagnosis not present

## 2023-07-11 DIAGNOSIS — E876 Hypokalemia: Secondary | ICD-10-CM | POA: Diagnosis not present

## 2023-07-11 DIAGNOSIS — N186 End stage renal disease: Secondary | ICD-10-CM | POA: Diagnosis not present

## 2023-07-11 DIAGNOSIS — D689 Coagulation defect, unspecified: Secondary | ICD-10-CM | POA: Diagnosis not present

## 2023-07-11 DIAGNOSIS — Z992 Dependence on renal dialysis: Secondary | ICD-10-CM | POA: Diagnosis not present

## 2023-07-14 DIAGNOSIS — N186 End stage renal disease: Secondary | ICD-10-CM | POA: Diagnosis not present

## 2023-07-14 DIAGNOSIS — D689 Coagulation defect, unspecified: Secondary | ICD-10-CM | POA: Diagnosis not present

## 2023-07-14 DIAGNOSIS — N2581 Secondary hyperparathyroidism of renal origin: Secondary | ICD-10-CM | POA: Diagnosis not present

## 2023-07-14 DIAGNOSIS — E876 Hypokalemia: Secondary | ICD-10-CM | POA: Diagnosis not present

## 2023-07-14 DIAGNOSIS — Z992 Dependence on renal dialysis: Secondary | ICD-10-CM | POA: Diagnosis not present

## 2023-07-16 DIAGNOSIS — D689 Coagulation defect, unspecified: Secondary | ICD-10-CM | POA: Diagnosis not present

## 2023-07-16 DIAGNOSIS — N186 End stage renal disease: Secondary | ICD-10-CM | POA: Diagnosis not present

## 2023-07-16 DIAGNOSIS — Z992 Dependence on renal dialysis: Secondary | ICD-10-CM | POA: Diagnosis not present

## 2023-07-16 DIAGNOSIS — N2581 Secondary hyperparathyroidism of renal origin: Secondary | ICD-10-CM | POA: Diagnosis not present

## 2023-07-16 DIAGNOSIS — E876 Hypokalemia: Secondary | ICD-10-CM | POA: Diagnosis not present

## 2023-07-17 DIAGNOSIS — C44629 Squamous cell carcinoma of skin of left upper limb, including shoulder: Secondary | ICD-10-CM | POA: Diagnosis not present

## 2023-07-18 DIAGNOSIS — N2581 Secondary hyperparathyroidism of renal origin: Secondary | ICD-10-CM | POA: Diagnosis not present

## 2023-07-18 DIAGNOSIS — N186 End stage renal disease: Secondary | ICD-10-CM | POA: Diagnosis not present

## 2023-07-18 DIAGNOSIS — Z992 Dependence on renal dialysis: Secondary | ICD-10-CM | POA: Diagnosis not present

## 2023-07-18 DIAGNOSIS — E876 Hypokalemia: Secondary | ICD-10-CM | POA: Diagnosis not present

## 2023-07-18 DIAGNOSIS — D689 Coagulation defect, unspecified: Secondary | ICD-10-CM | POA: Diagnosis not present

## 2023-07-21 DIAGNOSIS — N2581 Secondary hyperparathyroidism of renal origin: Secondary | ICD-10-CM | POA: Diagnosis not present

## 2023-07-21 DIAGNOSIS — E876 Hypokalemia: Secondary | ICD-10-CM | POA: Diagnosis not present

## 2023-07-21 DIAGNOSIS — D689 Coagulation defect, unspecified: Secondary | ICD-10-CM | POA: Diagnosis not present

## 2023-07-21 DIAGNOSIS — N186 End stage renal disease: Secondary | ICD-10-CM | POA: Diagnosis not present

## 2023-07-21 DIAGNOSIS — Z992 Dependence on renal dialysis: Secondary | ICD-10-CM | POA: Diagnosis not present

## 2023-07-23 DIAGNOSIS — N2581 Secondary hyperparathyroidism of renal origin: Secondary | ICD-10-CM | POA: Diagnosis not present

## 2023-07-23 DIAGNOSIS — Z992 Dependence on renal dialysis: Secondary | ICD-10-CM | POA: Diagnosis not present

## 2023-07-23 DIAGNOSIS — E876 Hypokalemia: Secondary | ICD-10-CM | POA: Diagnosis not present

## 2023-07-23 DIAGNOSIS — E1129 Type 2 diabetes mellitus with other diabetic kidney complication: Secondary | ICD-10-CM | POA: Diagnosis not present

## 2023-07-23 DIAGNOSIS — N186 End stage renal disease: Secondary | ICD-10-CM | POA: Diagnosis not present

## 2023-07-23 DIAGNOSIS — D689 Coagulation defect, unspecified: Secondary | ICD-10-CM | POA: Diagnosis not present

## 2023-07-25 DIAGNOSIS — N2581 Secondary hyperparathyroidism of renal origin: Secondary | ICD-10-CM | POA: Diagnosis not present

## 2023-07-25 DIAGNOSIS — E876 Hypokalemia: Secondary | ICD-10-CM | POA: Diagnosis not present

## 2023-07-25 DIAGNOSIS — N186 End stage renal disease: Secondary | ICD-10-CM | POA: Diagnosis not present

## 2023-07-25 DIAGNOSIS — Z992 Dependence on renal dialysis: Secondary | ICD-10-CM | POA: Diagnosis not present

## 2023-07-25 DIAGNOSIS — D689 Coagulation defect, unspecified: Secondary | ICD-10-CM | POA: Diagnosis not present

## 2023-07-28 DIAGNOSIS — D689 Coagulation defect, unspecified: Secondary | ICD-10-CM | POA: Diagnosis not present

## 2023-07-28 DIAGNOSIS — E876 Hypokalemia: Secondary | ICD-10-CM | POA: Diagnosis not present

## 2023-07-28 DIAGNOSIS — N2581 Secondary hyperparathyroidism of renal origin: Secondary | ICD-10-CM | POA: Diagnosis not present

## 2023-07-28 DIAGNOSIS — Z992 Dependence on renal dialysis: Secondary | ICD-10-CM | POA: Diagnosis not present

## 2023-07-28 DIAGNOSIS — N186 End stage renal disease: Secondary | ICD-10-CM | POA: Diagnosis not present

## 2023-07-30 DIAGNOSIS — N2581 Secondary hyperparathyroidism of renal origin: Secondary | ICD-10-CM | POA: Diagnosis not present

## 2023-07-30 DIAGNOSIS — Z992 Dependence on renal dialysis: Secondary | ICD-10-CM | POA: Diagnosis not present

## 2023-07-30 DIAGNOSIS — D689 Coagulation defect, unspecified: Secondary | ICD-10-CM | POA: Diagnosis not present

## 2023-07-30 DIAGNOSIS — E876 Hypokalemia: Secondary | ICD-10-CM | POA: Diagnosis not present

## 2023-07-30 DIAGNOSIS — N186 End stage renal disease: Secondary | ICD-10-CM | POA: Diagnosis not present

## 2023-07-31 DIAGNOSIS — A419 Sepsis, unspecified organism: Secondary | ICD-10-CM | POA: Diagnosis not present

## 2023-07-31 DIAGNOSIS — R6521 Severe sepsis with septic shock: Secondary | ICD-10-CM | POA: Diagnosis not present

## 2023-07-31 DIAGNOSIS — N186 End stage renal disease: Secondary | ICD-10-CM | POA: Diagnosis not present

## 2023-07-31 DIAGNOSIS — E440 Moderate protein-calorie malnutrition: Secondary | ICD-10-CM | POA: Diagnosis not present

## 2023-07-31 DIAGNOSIS — I4891 Unspecified atrial fibrillation: Secondary | ICD-10-CM | POA: Diagnosis not present

## 2023-08-01 DIAGNOSIS — D689 Coagulation defect, unspecified: Secondary | ICD-10-CM | POA: Diagnosis not present

## 2023-08-01 DIAGNOSIS — E876 Hypokalemia: Secondary | ICD-10-CM | POA: Diagnosis not present

## 2023-08-01 DIAGNOSIS — N186 End stage renal disease: Secondary | ICD-10-CM | POA: Diagnosis not present

## 2023-08-01 DIAGNOSIS — N2581 Secondary hyperparathyroidism of renal origin: Secondary | ICD-10-CM | POA: Diagnosis not present

## 2023-08-01 DIAGNOSIS — Z992 Dependence on renal dialysis: Secondary | ICD-10-CM | POA: Diagnosis not present

## 2023-08-04 DIAGNOSIS — N186 End stage renal disease: Secondary | ICD-10-CM | POA: Diagnosis not present

## 2023-08-04 DIAGNOSIS — N2581 Secondary hyperparathyroidism of renal origin: Secondary | ICD-10-CM | POA: Diagnosis not present

## 2023-08-04 DIAGNOSIS — D689 Coagulation defect, unspecified: Secondary | ICD-10-CM | POA: Diagnosis not present

## 2023-08-04 DIAGNOSIS — E876 Hypokalemia: Secondary | ICD-10-CM | POA: Diagnosis not present

## 2023-08-04 DIAGNOSIS — Z992 Dependence on renal dialysis: Secondary | ICD-10-CM | POA: Diagnosis not present

## 2023-08-06 DIAGNOSIS — E876 Hypokalemia: Secondary | ICD-10-CM | POA: Diagnosis not present

## 2023-08-06 DIAGNOSIS — Z992 Dependence on renal dialysis: Secondary | ICD-10-CM | POA: Diagnosis not present

## 2023-08-06 DIAGNOSIS — N186 End stage renal disease: Secondary | ICD-10-CM | POA: Diagnosis not present

## 2023-08-06 DIAGNOSIS — N2581 Secondary hyperparathyroidism of renal origin: Secondary | ICD-10-CM | POA: Diagnosis not present

## 2023-08-06 DIAGNOSIS — D689 Coagulation defect, unspecified: Secondary | ICD-10-CM | POA: Diagnosis not present

## 2023-08-08 DIAGNOSIS — N186 End stage renal disease: Secondary | ICD-10-CM | POA: Diagnosis not present

## 2023-08-08 DIAGNOSIS — N2581 Secondary hyperparathyroidism of renal origin: Secondary | ICD-10-CM | POA: Diagnosis not present

## 2023-08-08 DIAGNOSIS — D689 Coagulation defect, unspecified: Secondary | ICD-10-CM | POA: Diagnosis not present

## 2023-08-08 DIAGNOSIS — Z992 Dependence on renal dialysis: Secondary | ICD-10-CM | POA: Diagnosis not present

## 2023-08-08 DIAGNOSIS — E876 Hypokalemia: Secondary | ICD-10-CM | POA: Diagnosis not present

## 2023-08-11 DIAGNOSIS — N2581 Secondary hyperparathyroidism of renal origin: Secondary | ICD-10-CM | POA: Diagnosis not present

## 2023-08-11 DIAGNOSIS — D689 Coagulation defect, unspecified: Secondary | ICD-10-CM | POA: Diagnosis not present

## 2023-08-11 DIAGNOSIS — E876 Hypokalemia: Secondary | ICD-10-CM | POA: Diagnosis not present

## 2023-08-11 DIAGNOSIS — Z992 Dependence on renal dialysis: Secondary | ICD-10-CM | POA: Diagnosis not present

## 2023-08-11 DIAGNOSIS — N186 End stage renal disease: Secondary | ICD-10-CM | POA: Diagnosis not present

## 2023-08-13 DIAGNOSIS — E876 Hypokalemia: Secondary | ICD-10-CM | POA: Diagnosis not present

## 2023-08-13 DIAGNOSIS — N2581 Secondary hyperparathyroidism of renal origin: Secondary | ICD-10-CM | POA: Diagnosis not present

## 2023-08-13 DIAGNOSIS — D689 Coagulation defect, unspecified: Secondary | ICD-10-CM | POA: Diagnosis not present

## 2023-08-13 DIAGNOSIS — N186 End stage renal disease: Secondary | ICD-10-CM | POA: Diagnosis not present

## 2023-08-13 DIAGNOSIS — Z992 Dependence on renal dialysis: Secondary | ICD-10-CM | POA: Diagnosis not present

## 2023-08-15 DIAGNOSIS — Z992 Dependence on renal dialysis: Secondary | ICD-10-CM | POA: Diagnosis not present

## 2023-08-15 DIAGNOSIS — E876 Hypokalemia: Secondary | ICD-10-CM | POA: Diagnosis not present

## 2023-08-15 DIAGNOSIS — N2581 Secondary hyperparathyroidism of renal origin: Secondary | ICD-10-CM | POA: Diagnosis not present

## 2023-08-15 DIAGNOSIS — D689 Coagulation defect, unspecified: Secondary | ICD-10-CM | POA: Diagnosis not present

## 2023-08-15 DIAGNOSIS — N186 End stage renal disease: Secondary | ICD-10-CM | POA: Diagnosis not present

## 2023-08-18 DIAGNOSIS — Z992 Dependence on renal dialysis: Secondary | ICD-10-CM | POA: Diagnosis not present

## 2023-08-18 DIAGNOSIS — N186 End stage renal disease: Secondary | ICD-10-CM | POA: Diagnosis not present

## 2023-08-18 DIAGNOSIS — N2581 Secondary hyperparathyroidism of renal origin: Secondary | ICD-10-CM | POA: Diagnosis not present

## 2023-08-18 DIAGNOSIS — D689 Coagulation defect, unspecified: Secondary | ICD-10-CM | POA: Diagnosis not present

## 2023-08-18 DIAGNOSIS — E876 Hypokalemia: Secondary | ICD-10-CM | POA: Diagnosis not present

## 2023-08-18 NOTE — Progress Notes (Unsigned)
[facility] [place]  ASSESSMENT / PLAN: Russell Bowers is a 72 y.o. right handed male in need of permanent dialysis access. I reviewed options for dialysis in detail with the patient, including hemodialysis and peritoneal dialysis. I counseled the patient to ask their nephrologist about their candidacy for renal transplant. I counseled the patient that dialysis access requires surveillance and periodic maintenance. Plan to proceed with laparoscopic peritoneal dialysis catheter placement in the near future as schedule allows.   CHIEF COMPLAINT: Desires transition to peritoneal dialysis  HISTORY OF PRESENT ILLNESS: Russell Bowers is a 72 y.o. male with end-stage renal disease previously managed with kidney transplant.  This is unfortunately failed after 11 years of successful use.  He previously dialyzed through a left radiocephalic arteriovenous fistula which is remarkably still patent nearly 16 years after its creation.  The patient desires transition to peritoneal dialysis.  We reviewed peritoneal dialysis in detail and its benefits and drawbacks.  We reviewed surgical placement of laparoscopic peritoneal dialysis catheter and its risk, benefits and alternatives.   Past Medical History:  Diagnosis Date   ADHD (attention deficit hyperactivity disorder)    Allergic rhinitis    Cancer (HCC)    Prostate cancer   Cellulitis and abscess of trunk 2014   history of back abscess   Cellulitis and abscess of unspecified site 2014   lower back   Detached retina    Diabetes mellitus without complication (HCC)    Elevated troponin    Fistula    OF LEFT ARM   Gout    H/O kidney transplant    History of congestive heart failure    FOLLOWED BY DR. 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 Sherene Sires   Hypertension    Mild concentric left  ventricular hypertrophy (LVH)    AND AORTIC STENOSIS FOLLOWED IN THE PAST BY CARDIOLOGIST DR. Anne Fu.   OSA treated with BiPAP    Personal history of colonic polyps    Prostate cancer (HCC) 11/2010   PROSTATECTOMY WITH UROLOGIST DR. DAVIS   Renal failure    Shortness of breath    Sleep disorder breathing    FOLLOWED BY PULMONOLOGIST DR. Vassie Loll    Past Surgical History:  Procedure Laterality Date   CATARACT EXTRACTION, BILATERAL  11/2014   COLONOSCOPY  09/2014   KIDNEY TRANSPLANT  12/01/2011   PROSTATECTOMY     FOR PROSTATE CANCER   prostectomy  11/2010   RETINAL DETACHMENT SURGERY  06/2002    Family History  Problem Relation Age of Onset   Diabetes Mother    Hypertension Mother    Atrial fibrillation Mother    Alzheimer's disease Father    Heart Problems Sister    Heart disease Maternal Grandfather    Heart disease Maternal Grandmother     Social History   Socioeconomic History   Marital status: Married    Spouse name: Not on file   Number of children: Not on file   Years of education: Not on file   Highest education level: Not on file  Occupational History   Occupation: Retired  Tobacco Use   Smoking status: Former    Current packs/day: 0.00    Average packs/day: 1 pack/day for 21.0 years (21.0 ttl pk-yrs)    Types: Cigarettes    Start date: 03/16/1972    Quit date: 03/16/1993    Years  since quitting: 30.4   Smokeless tobacco: Never  Vaping Use   Vaping status: Never Used  Substance and Sexual Activity   Alcohol use: Yes    Alcohol/week: 1.0 - 2.0 standard drink of alcohol    Types: 1 - 2 Glasses of wine per week   Drug use: No   Sexual activity: Not on file  Other Topics Concern   Not on file  Social History Narrative   Not on file   Social Determinants of Health   Financial Resource Strain: Not on file  Food Insecurity: No Food Insecurity (01/25/2023)   Hunger Vital Sign    Worried About Running Out of Food in the Last Year: Never true    Ran Out of  Food in the Last Year: Never true  Transportation Needs: No Transportation Needs (01/25/2023)   PRAPARE - Administrator, Civil Service (Medical): No    Lack of Transportation (Non-Medical): No  Physical Activity: Not on file  Stress: Not on file  Social Connections: Not on file  Intimate Partner Violence: Not At Risk (01/25/2023)   Humiliation, Afraid, Rape, and Kick questionnaire    Fear of Current or Ex-Partner: No    Emotionally Abused: No    Physically Abused: No    Sexually Abused: No    Allergies  Allergen Reactions   Azathioprine Other (See Comments)   Phenergan [Promethazine Hcl] Other (See Comments)    Retention/obtundation, needed intubation   Pollen Extract Other (See Comments)    stuffy nose    Current Outpatient Medications  Medication Sig Dispense Refill   allopurinol (ZYLOPRIM) 100 MG tablet Take 100 mg by mouth daily.     apixaban (ELIQUIS) 2.5 MG TABS tablet Take 2.5 mg by mouth 2 (two) times daily.     B-D ULTRAFINE III SHORT PEN 31G X 8 MM MISC Inject into the skin daily.     calcitRIOL (ROCALTROL) 0.25 MCG capsule Take 0.25 mcg by mouth daily.     cinacalcet (SENSIPAR) 30 MG tablet Take 30 mg by mouth every other day.     docusate sodium (COLACE) 100 MG capsule Take 100 mg by mouth daily.     famotidine (PEPCID) 20 MG tablet Take 20 mg by mouth daily.     folic acid (FOLVITE) 1 MG tablet TAKE 2 TABLETS BY MOUTH EVERY DAY 180 tablet 4   insulin glargine (LANTUS) 100 UNIT/ML injection Inject 0.1 mLs (10 Units total) into the skin at bedtime. 10 mL 11   loperamide (IMODIUM) 2 MG capsule Take 2 mg by mouth as needed for diarrhea or loose stools.     loratadine (CLARITIN) 10 MG tablet Take 10 mg by mouth daily.     Magnesium Cl-Calcium Carbonate (SLOW-MAG PO) Take 1 tablet by mouth daily.      metoprolol tartrate (LOPRESSOR) 25 MG tablet Take 25 mg by mouth 2 (two) times daily.     ONETOUCH ULTRA test strip 3 (three) times daily.     pravastatin  (PRAVACHOL) 10 MG tablet Take 10 mg by mouth at bedtime.      tacrolimus (PROGRAF) 0.5 MG capsule Place 4 capsules (2 mg total) under the tongue 2 (two) times daily.     vitamin B-12 (CYANOCOBALAMIN) 500 MCG tablet Take 1,000 mcg by mouth at bedtime.     No current facility-administered medications for this visit.    PHYSICAL EXAM Vitals:   08/19/23 1037  BP: 111/71  Pulse: 93  Resp: 20  Temp: 98.8  F (37.1 C)  SpO2: 98%  Weight: 170 lb (77.1 kg)  Height: 5\' 9"  (1.753 m)    Elderly man in no distress Regular rate and rhythm Unlabored breathing Nondistended abdomen Scar from prior kidney transplant noted  PERTINENT LABORATORY AND RADIOLOGIC DATA  Most recent CBC    Latest Ref Rng & Units 01/29/2023    2:55 AM 01/28/2023   12:56 AM 01/27/2023    8:38 AM  CBC  WBC 4.0 - 10.5 K/uL 8.6  9.1    Hemoglobin 13.0 - 17.0 g/dL 40.3  47.4  25.9   Hematocrit 39.0 - 52.0 % 36.5  39.4  39.0   Platelets 150 - 400 K/uL 71  79       Most recent CMP    Latest Ref Rng & Units 01/30/2023    1:59 AM 01/29/2023    2:55 AM 01/28/2023   11:28 AM  CMP  Glucose 70 - 99 mg/dL 563  875  643   BUN 8 - 23 mg/dL 35  46  32   Creatinine 0.61 - 1.24 mg/dL 3.29  5.18  8.41   Sodium 135 - 145 mmol/L 133  131  134   Potassium 3.5 - 5.1 mmol/L 3.9  3.4  4.2   Chloride 98 - 111 mmol/L 90  93  91   CO2 22 - 32 mmol/L 29  27  30    Calcium 8.9 - 10.3 mg/dL 9.3  9.2  9.6     Renal function CrCl cannot be calculated (Patient's most recent lab result is older than the maximum 21 days allowed.).  Hgb A1c MFr Bld (%)  Date Value  01/15/2023 7.7 (H)      Rande Brunt. Lenell Antu, MD FACS [facility] Phone Number: [phone] 08/19/2023 11:48 AM   Total time spent on preparing this encounter including chart review, data review, collecting history, examining the patient, coordinating care for this new patient, 60 minutes.  Portions of this report may have been  transcribed using voice recognition software.  Every effort has been made to ensure accuracy; however, inadvertent computerized transcription errors may still be present.

## 2023-08-18 NOTE — H&P (View-Only) (Signed)
[facility] [place]  ASSESSMENT / PLAN: Russell Bowers is a 72 y.o. right handed male in need of permanent dialysis access. I reviewed options for dialysis in detail with the patient, including hemodialysis and peritoneal dialysis. I counseled the patient to ask their nephrologist about their candidacy for renal transplant. I counseled the patient that dialysis access requires surveillance and periodic maintenance. Plan to proceed with laparoscopic peritoneal dialysis catheter placement in the near future as schedule allows.   CHIEF COMPLAINT: Desires transition to peritoneal dialysis  HISTORY OF PRESENT ILLNESS: Russell Bowers is a 72 y.o. male with end-stage renal disease previously managed with kidney transplant.  This is unfortunately failed after 11 years of successful use.  He previously dialyzed through a left radiocephalic arteriovenous fistula which is remarkably still patent nearly 16 years after its creation.  The patient desires transition to peritoneal dialysis.  We reviewed peritoneal dialysis in detail and its benefits and drawbacks.  We reviewed surgical placement of laparoscopic peritoneal dialysis catheter and its risk, benefits and alternatives.   Past Medical History:  Diagnosis Date   ADHD (attention deficit hyperactivity disorder)    Allergic rhinitis    Cancer (HCC)    Prostate cancer   Cellulitis and abscess of trunk 2014   history of back abscess   Cellulitis and abscess of unspecified site 2014   lower back   Detached retina    Diabetes mellitus without complication (HCC)    Elevated troponin    Fistula    OF LEFT ARM   Gout    H/O kidney transplant    History of congestive heart failure    FOLLOWED BY DR. 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 Sherene Sires   Hypertension    Mild concentric left  ventricular hypertrophy (LVH)    AND AORTIC STENOSIS FOLLOWED IN THE PAST BY CARDIOLOGIST DR. Anne Fu.   OSA treated with BiPAP    Personal history of colonic polyps    Prostate cancer (HCC) 11/2010   PROSTATECTOMY WITH UROLOGIST DR. DAVIS   Renal failure    Shortness of breath    Sleep disorder breathing    FOLLOWED BY PULMONOLOGIST DR. Vassie Loll    Past Surgical History:  Procedure Laterality Date   CATARACT EXTRACTION, BILATERAL  11/2014   COLONOSCOPY  09/2014   KIDNEY TRANSPLANT  12/01/2011   PROSTATECTOMY     FOR PROSTATE CANCER   prostectomy  11/2010   RETINAL DETACHMENT SURGERY  06/2002    Family History  Problem Relation Age of Onset   Diabetes Mother    Hypertension Mother    Atrial fibrillation Mother    Alzheimer's disease Father    Heart Problems Sister    Heart disease Maternal Grandfather    Heart disease Maternal Grandmother     Social History   Socioeconomic History   Marital status: Married    Spouse name: Not on file   Number of children: Not on file   Years of education: Not on file   Highest education level: Not on file  Occupational History   Occupation: Retired  Tobacco Use   Smoking status: Former    Current packs/day: 0.00    Average packs/day: 1 pack/day for 21.0 years (21.0 ttl pk-yrs)    Types: Cigarettes    Start date: 03/16/1972    Quit date: 03/16/1993    Years  since quitting: 30.4   Smokeless tobacco: Never  Vaping Use   Vaping status: Never Used  Substance and Sexual Activity   Alcohol use: Yes    Alcohol/week: 1.0 - 2.0 standard drink of alcohol    Types: 1 - 2 Glasses of wine per week   Drug use: No   Sexual activity: Not on file  Other Topics Concern   Not on file  Social History Narrative   Not on file   Social Determinants of Health   Financial Resource Strain: Not on file  Food Insecurity: No Food Insecurity (01/25/2023)   Hunger Vital Sign    Worried About Running Out of Food in the Last Year: Never true    Ran Out of  Food in the Last Year: Never true  Transportation Needs: No Transportation Needs (01/25/2023)   PRAPARE - Administrator, Civil Service (Medical): No    Lack of Transportation (Non-Medical): No  Physical Activity: Not on file  Stress: Not on file  Social Connections: Not on file  Intimate Partner Violence: Not At Risk (01/25/2023)   Humiliation, Afraid, Rape, and Kick questionnaire    Fear of Current or Ex-Partner: No    Emotionally Abused: No    Physically Abused: No    Sexually Abused: No    Allergies  Allergen Reactions   Azathioprine Other (See Comments)   Phenergan [Promethazine Hcl] Other (See Comments)    Retention/obtundation, needed intubation   Pollen Extract Other (See Comments)    stuffy nose    Current Outpatient Medications  Medication Sig Dispense Refill   allopurinol (ZYLOPRIM) 100 MG tablet Take 100 mg by mouth daily.     apixaban (ELIQUIS) 2.5 MG TABS tablet Take 2.5 mg by mouth 2 (two) times daily.     B-D ULTRAFINE III SHORT PEN 31G X 8 MM MISC Inject into the skin daily.     calcitRIOL (ROCALTROL) 0.25 MCG capsule Take 0.25 mcg by mouth daily.     cinacalcet (SENSIPAR) 30 MG tablet Take 30 mg by mouth every other day.     docusate sodium (COLACE) 100 MG capsule Take 100 mg by mouth daily.     famotidine (PEPCID) 20 MG tablet Take 20 mg by mouth daily.     folic acid (FOLVITE) 1 MG tablet TAKE 2 TABLETS BY MOUTH EVERY DAY 180 tablet 4   insulin glargine (LANTUS) 100 UNIT/ML injection Inject 0.1 mLs (10 Units total) into the skin at bedtime. 10 mL 11   loperamide (IMODIUM) 2 MG capsule Take 2 mg by mouth as needed for diarrhea or loose stools.     loratadine (CLARITIN) 10 MG tablet Take 10 mg by mouth daily.     Magnesium Cl-Calcium Carbonate (SLOW-MAG PO) Take 1 tablet by mouth daily.      metoprolol tartrate (LOPRESSOR) 25 MG tablet Take 25 mg by mouth 2 (two) times daily.     ONETOUCH ULTRA test strip 3 (three) times daily.     pravastatin  (PRAVACHOL) 10 MG tablet Take 10 mg by mouth at bedtime.      tacrolimus (PROGRAF) 0.5 MG capsule Place 4 capsules (2 mg total) under the tongue 2 (two) times daily.     vitamin B-12 (CYANOCOBALAMIN) 500 MCG tablet Take 1,000 mcg by mouth at bedtime.     No current facility-administered medications for this visit.    PHYSICAL EXAM Vitals:   08/19/23 1037  BP: 111/71  Pulse: 93  Resp: 20  Temp: 98.8  F (37.1 C)  SpO2: 98%  Weight: 170 lb (77.1 kg)  Height: 5\' 9"  (1.753 m)    Elderly man in no distress Regular rate and rhythm Unlabored breathing Nondistended abdomen Scar from prior kidney transplant noted  PERTINENT LABORATORY AND RADIOLOGIC DATA  Most recent CBC    Latest Ref Rng & Units 01/29/2023    2:55 AM 01/28/2023   12:56 AM 01/27/2023    8:38 AM  CBC  WBC 4.0 - 10.5 K/uL 8.6  9.1    Hemoglobin 13.0 - 17.0 g/dL 40.3  47.4  25.9   Hematocrit 39.0 - 52.0 % 36.5  39.4  39.0   Platelets 150 - 400 K/uL 71  79       Most recent CMP    Latest Ref Rng & Units 01/30/2023    1:59 AM 01/29/2023    2:55 AM 01/28/2023   11:28 AM  CMP  Glucose 70 - 99 mg/dL 563  875  643   BUN 8 - 23 mg/dL 35  46  32   Creatinine 0.61 - 1.24 mg/dL 3.29  5.18  8.41   Sodium 135 - 145 mmol/L 133  131  134   Potassium 3.5 - 5.1 mmol/L 3.9  3.4  4.2   Chloride 98 - 111 mmol/L 90  93  91   CO2 22 - 32 mmol/L 29  27  30    Calcium 8.9 - 10.3 mg/dL 9.3  9.2  9.6     Renal function CrCl cannot be calculated (Patient's most recent lab result is older than the maximum 21 days allowed.).  Hgb A1c MFr Bld (%)  Date Value  01/15/2023 7.7 (H)      Rande Brunt. Lenell Antu, MD FACS [facility] Phone Number: [phone] 08/19/2023 11:48 AM   Total time spent on preparing this encounter including chart review, data review, collecting history, examining the patient, coordinating care for this new patient, 60 minutes.  Portions of this report may have been  transcribed using voice recognition software.  Every effort has been made to ensure accuracy; however, inadvertent computerized transcription errors may still be present.

## 2023-08-19 ENCOUNTER — Encounter: Payer: Self-pay | Admitting: Vascular Surgery

## 2023-08-19 ENCOUNTER — Ambulatory Visit (INDEPENDENT_AMBULATORY_CARE_PROVIDER_SITE_OTHER): Payer: Medicare Other | Admitting: Vascular Surgery

## 2023-08-19 VITALS — BP 111/71 | HR 93 | Temp 98.8°F | Resp 20 | Ht 69.0 in | Wt 170.0 lb

## 2023-08-19 DIAGNOSIS — N186 End stage renal disease: Secondary | ICD-10-CM | POA: Diagnosis not present

## 2023-08-19 DIAGNOSIS — Z992 Dependence on renal dialysis: Secondary | ICD-10-CM

## 2023-08-20 ENCOUNTER — Telehealth: Payer: Self-pay | Admitting: Pulmonary Disease

## 2023-08-20 ENCOUNTER — Other Ambulatory Visit: Payer: Self-pay

## 2023-08-20 DIAGNOSIS — N186 End stage renal disease: Secondary | ICD-10-CM | POA: Diagnosis not present

## 2023-08-20 DIAGNOSIS — Z992 Dependence on renal dialysis: Secondary | ICD-10-CM | POA: Diagnosis not present

## 2023-08-20 DIAGNOSIS — D689 Coagulation defect, unspecified: Secondary | ICD-10-CM | POA: Diagnosis not present

## 2023-08-20 DIAGNOSIS — N2581 Secondary hyperparathyroidism of renal origin: Secondary | ICD-10-CM | POA: Diagnosis not present

## 2023-08-20 DIAGNOSIS — E876 Hypokalemia: Secondary | ICD-10-CM | POA: Diagnosis not present

## 2023-08-21 ENCOUNTER — Encounter (HOSPITAL_BASED_OUTPATIENT_CLINIC_OR_DEPARTMENT_OTHER): Payer: Self-pay

## 2023-08-22 DIAGNOSIS — N2581 Secondary hyperparathyroidism of renal origin: Secondary | ICD-10-CM | POA: Diagnosis not present

## 2023-08-22 DIAGNOSIS — E876 Hypokalemia: Secondary | ICD-10-CM | POA: Diagnosis not present

## 2023-08-22 DIAGNOSIS — Z992 Dependence on renal dialysis: Secondary | ICD-10-CM | POA: Diagnosis not present

## 2023-08-22 DIAGNOSIS — N186 End stage renal disease: Secondary | ICD-10-CM | POA: Diagnosis not present

## 2023-08-22 DIAGNOSIS — D689 Coagulation defect, unspecified: Secondary | ICD-10-CM | POA: Diagnosis not present

## 2023-08-23 DIAGNOSIS — Z992 Dependence on renal dialysis: Secondary | ICD-10-CM | POA: Diagnosis not present

## 2023-08-23 DIAGNOSIS — E1129 Type 2 diabetes mellitus with other diabetic kidney complication: Secondary | ICD-10-CM | POA: Diagnosis not present

## 2023-08-23 DIAGNOSIS — N186 End stage renal disease: Secondary | ICD-10-CM | POA: Diagnosis not present

## 2023-08-25 DIAGNOSIS — N186 End stage renal disease: Secondary | ICD-10-CM | POA: Diagnosis not present

## 2023-08-25 DIAGNOSIS — D689 Coagulation defect, unspecified: Secondary | ICD-10-CM | POA: Diagnosis not present

## 2023-08-25 DIAGNOSIS — E876 Hypokalemia: Secondary | ICD-10-CM | POA: Diagnosis not present

## 2023-08-25 DIAGNOSIS — Z992 Dependence on renal dialysis: Secondary | ICD-10-CM | POA: Diagnosis not present

## 2023-08-25 DIAGNOSIS — N2581 Secondary hyperparathyroidism of renal origin: Secondary | ICD-10-CM | POA: Diagnosis not present

## 2023-08-27 DIAGNOSIS — N186 End stage renal disease: Secondary | ICD-10-CM | POA: Diagnosis not present

## 2023-08-27 DIAGNOSIS — Z992 Dependence on renal dialysis: Secondary | ICD-10-CM | POA: Diagnosis not present

## 2023-08-27 DIAGNOSIS — E876 Hypokalemia: Secondary | ICD-10-CM | POA: Diagnosis not present

## 2023-08-27 DIAGNOSIS — N2581 Secondary hyperparathyroidism of renal origin: Secondary | ICD-10-CM | POA: Diagnosis not present

## 2023-08-27 DIAGNOSIS — D689 Coagulation defect, unspecified: Secondary | ICD-10-CM | POA: Diagnosis not present

## 2023-08-29 DIAGNOSIS — D689 Coagulation defect, unspecified: Secondary | ICD-10-CM | POA: Diagnosis not present

## 2023-08-29 DIAGNOSIS — Z992 Dependence on renal dialysis: Secondary | ICD-10-CM | POA: Diagnosis not present

## 2023-08-29 DIAGNOSIS — E876 Hypokalemia: Secondary | ICD-10-CM | POA: Diagnosis not present

## 2023-08-29 DIAGNOSIS — N2581 Secondary hyperparathyroidism of renal origin: Secondary | ICD-10-CM | POA: Diagnosis not present

## 2023-08-29 DIAGNOSIS — N186 End stage renal disease: Secondary | ICD-10-CM | POA: Diagnosis not present

## 2023-08-31 DIAGNOSIS — A419 Sepsis, unspecified organism: Secondary | ICD-10-CM | POA: Diagnosis not present

## 2023-08-31 DIAGNOSIS — I4891 Unspecified atrial fibrillation: Secondary | ICD-10-CM | POA: Diagnosis not present

## 2023-08-31 DIAGNOSIS — E440 Moderate protein-calorie malnutrition: Secondary | ICD-10-CM | POA: Diagnosis not present

## 2023-08-31 DIAGNOSIS — R6521 Severe sepsis with septic shock: Secondary | ICD-10-CM | POA: Diagnosis not present

## 2023-08-31 DIAGNOSIS — N186 End stage renal disease: Secondary | ICD-10-CM | POA: Diagnosis not present

## 2023-09-01 DIAGNOSIS — N186 End stage renal disease: Secondary | ICD-10-CM | POA: Diagnosis not present

## 2023-09-01 DIAGNOSIS — E876 Hypokalemia: Secondary | ICD-10-CM | POA: Diagnosis not present

## 2023-09-01 DIAGNOSIS — N2581 Secondary hyperparathyroidism of renal origin: Secondary | ICD-10-CM | POA: Diagnosis not present

## 2023-09-01 DIAGNOSIS — Z992 Dependence on renal dialysis: Secondary | ICD-10-CM | POA: Diagnosis not present

## 2023-09-01 DIAGNOSIS — D689 Coagulation defect, unspecified: Secondary | ICD-10-CM | POA: Diagnosis not present

## 2023-09-03 ENCOUNTER — Encounter (HOSPITAL_COMMUNITY): Payer: Self-pay | Admitting: Vascular Surgery

## 2023-09-03 ENCOUNTER — Other Ambulatory Visit: Payer: Self-pay

## 2023-09-03 DIAGNOSIS — N186 End stage renal disease: Secondary | ICD-10-CM | POA: Diagnosis not present

## 2023-09-03 DIAGNOSIS — D689 Coagulation defect, unspecified: Secondary | ICD-10-CM | POA: Diagnosis not present

## 2023-09-03 DIAGNOSIS — Z992 Dependence on renal dialysis: Secondary | ICD-10-CM | POA: Diagnosis not present

## 2023-09-03 DIAGNOSIS — N2581 Secondary hyperparathyroidism of renal origin: Secondary | ICD-10-CM | POA: Diagnosis not present

## 2023-09-03 DIAGNOSIS — E876 Hypokalemia: Secondary | ICD-10-CM | POA: Diagnosis not present

## 2023-09-03 NOTE — Progress Notes (Signed)
SDW CALL  Patient was given pre-op instructions over the phone. The opportunity was given for the patient to ask questions. No further questions asked. Patient verbalized understanding of instructions given.   PCP - Darlen Round Ross,MD Cardiologist - Donato Schultz, MD Pulmonologist - Cyril Mourning, MD Nephrologist - Tyson Babinski  PPM/ICD - denies Device Orders -  Rep Notified -   Chest x-ray - 01/25/23 EKG - 05/27/23 Stress Test - 08/2011 ECHO - 12/2422 Cardiac Cath - denies  Sleep Study - 03/29/2014 CPAP - BiPAP  Fasting Blood Sugar - doesn't know Checks Blood Sugar at bedtime  Blood Thinner Instructions: Pt's wife reports, pt last took Eliquis 9/9 per Dr. Verita Lamb instructions to hold Eliquis two days prior to surgery.  Aspirin Instructions:na  ERAS Protcol -no PRE-SURGERY Ensure or G2-   COVID TEST- na   Anesthesia review: yes  Patient's wife denies patient having shortness of breath, fever, cough and chest pain over the phone call. She stated patient's oxygen levels have "good" and he doesn't use home oxygen anymore.    Surgical Instructions    Your procedure is scheduled on September 12  Report to [facility] Main Entrance "A" at 0650 A.M., then check in with the Admitting office.  Call this number if you have problems the morning of surgery:  [phone]    Remember:  Do not eat or drink anything after midnight the night before your surgery   Take these medicines the morning of surgery with A SIP OF WATER: allopurinol,Pepcid,Claritin,Metoprolol,Prograf. PRN- Tylenol  As of today, STOP taking any Aspirin (unless otherwise instructed by your surgeon) Aleve, Naproxen, Ibuprofen, Motrin, Advil, Goody's, BC's, all herbal medications, fish oil, and all vitamins.  WHAT DO I DO ABOUT MY DIABETES MEDICATION?  THE NIGHT BEFORE SURGERY, take 5-10(50%of your normal dose)units of glargine(lantus)insulin.       HOW TO MANAGE YOUR DIABETES BEFORE AND AFTER  SURGERY  Check your blood sugar the morning of your surgery when you wake up and every 2 hours until you get to the Short Stay unit.  If your blood sugar is less than 70 mg/dL, you will need to treat for low blood sugar: Do not take insulin. Treat a low blood sugar (less than 70 mg/dL) with  cup of clear juice (cranberry or apple), 4 glucose tablets, OR glucose gel. Recheck blood sugar in 15 minutes after treatment (to make sure it is greater than 70 mg/dL). If your blood sugar is not greater than 70 mg/dL on recheck, call [phone] for further instructions. Report your blood sugar to the short stay nurse when you get to Short Stay.  [facility] is not responsible for any belongings or valuables. .   Do NOT Smoke (Tobacco/Vaping)  24 hours prior to your procedure  If you use a CPAP at night, you may bring your mask for your overnight stay.   Contacts, glasses, hearing aids, dentures or partials may not be worn into surgery, please bring cases for these belongings   Patients discharged the day of surgery will not be allowed to drive home, and someone needs to stay with them for 24 hours.   SURGICAL WAITING ROOM VISITATION You may have 1 visitor in the pre-op area at a time determined by the pre-op nurse. (Visitor may not switch out) Patients having surgery or a procedure in a hospital may have two support people in the waiting room. Children under the age of 64 must have an adult with them who is not the  patient. They may stay in the waiting area during the procedure and may switch out with other visitors. If the patient needs to stay at the hospital during part of their recovery, the visitor guidelines for inpatient rooms apply.  Please refer to the [facility] website for the visitor guidelines for Inpatients (after your surgery is over and you are in a regular room).     Special instructions:    Oral Hygiene is also important to reduce your risk of infection.  Remember - BRUSH  YOUR TEETH THE MORNING OF SURGERY WITH YOUR REGULAR TOOTHPASTE   Day of Surgery:  Take a shower the day of or night before with antibacterial soap. Wear Clean/Comfortable clothing the morning of surgery Do not apply any deodorants/lotions.   Do not wear jewelry or makeup Do not wear lotions, powders, perfumes/colognes, or deodorant. Do not shave 48 hours prior to surgery.  Men may shave face and neck. Do not bring valuables to the hospital. Do not wear nail polish, gel polish, artificial nails, or any other type of covering on natural nails (fingers and toes) If you have artificial nails or gel coating that need to be removed by a nail salon, please have this removed prior to surgery. Artificial nails or gel coating may interfere with anesthesia's ability to adequately monitor your vital signs. Remember to brush your teeth WITH YOUR REGULAR TOOTHPASTE.

## 2023-09-03 NOTE — Anesthesia Preprocedure Evaluation (Signed)
Anesthesia Evaluation  Patient identified by MRN, date of birth, ID band Patient awake    Reviewed: Allergy & Precautions, H&P , NPO status , Patient's Chart, lab work & pertinent test results  Airway Mallampati: II  TM Distance: >3 FB Neck ROM: Full    Dental no notable dental hx.    Pulmonary sleep apnea , former smoker   Pulmonary exam normal breath sounds clear to auscultation       Cardiovascular hypertension, +CHF  + dysrhythmias Atrial Fibrillation  Rhythm:Regular Rate:Normal  TTE 2022: normal BiV function. Mild AI / AS   Neuro/Psych negative neurological ROS     GI/Hepatic negative GI ROS, Neg liver ROS,,,  Endo/Other  diabetes    Renal/GU DialysisRenal diseaseHx of kidney transplant  negative genitourinary   Musculoskeletal negative musculoskeletal ROS (+)    Abdominal   Peds negative pediatric ROS (+)  Hematology  (+) Blood dyscrasia, anemia   Anesthesia Other Findings   Reproductive/Obstetrics negative OB ROS                              Anesthesia Physical Anesthesia Plan  ASA: 3  Anesthesia Plan: General   Post-op Pain Management:    Induction: Intravenous  PONV Risk Score and Plan: Ondansetron and Dexamethasone  Airway Management Planned: Oral ETT  Additional Equipment:   Intra-op Plan:   Post-operative Plan: Extubation in OR  Informed Consent: I have reviewed the patients History and Physical, chart, labs and discussed the procedure including the risks, benefits and alternatives for the proposed anesthesia with the patient or authorized representative who has indicated his/her understanding and acceptance.     Dental advisory given  Plan Discussed with: CRNA  Anesthesia Plan Comments: (History includes former smoker (quit 03/16/93), HTN, DM2, CKD (due to IgA nephropathy; DDKT 12/01/11; left radiocephalic AVF 12/02/06; progression to ESRD 01/08/23),  dyspnea, chronic diastolic CHF, aortic stenosis (mild-moderate 12/2022), Wenckebach (05/27/23), prostate cancer (s/p robot-assisted laparoscopic radical prostatectomy with bilateral pelvic lymphadenectomy on 11/27/10), OSA (O2 blended into BiPAP), right retinal detachment (s/p repair 07/13/02), elevated troponin (03/2013 in setting of acute hypercarbic respiratory failure, H. Influenza CAP, and tachycardia, likely demand ischemia), pancytopenia (2022, felt related to Imuran, evaluated by Dr. Arlan Organ).   )        Anesthesia Quick Evaluation

## 2023-09-03 NOTE — Progress Notes (Signed)
Anesthesia Chart Review: Russell Bowers  Case: [id number] Date/Time: 09/04/23 0902   Procedures:      LAPAROSCOPIC INSERTION CONTINUOUS AMBULATORY PERITONEAL DIALYSIS  (CAPD) CATHETER     POSSIBLE LAPAROSCOPIC OMENTOPEXY   Anesthesia type: General   Pre-op diagnosis: ESRD   Location: [facility] ROOM 11 / [facility]   Surgeons: Leonie Douglas, MD       DISCUSSION: Patient is a 72 year old male scheduled for the above procedure.  History includes former smoker (quit 03/16/93), HTN, DM2, CKD (due to IgA nephropathy; DDKT 12/01/11; left radiocephalic AVF 12/02/06; progression to ESRD 01/08/23), dyspnea, chronic diastolic CHF, aortic stenosis (mild-moderate 12/2022), Wenckebach (05/27/23), prostate cancer (s/p robot-assisted laparoscopic radical prostatectomy with bilateral pelvic lymphadenectomy on 11/27/10), OSA (O2 blended into BiPAP), right retinal detachment (s/p repair 07/13/02), elevated troponin (03/2013 in setting of acute hypercarbic respiratory failure, H. Influenza CAP, and tachycardia, likely demand ischemia), pancytopenia (2022, felt related to Imuran, evaluated by Dr. Arlan Organ).   Admitted to [facility] 01/14/23 - 01/30/23 for COVID-19, acute on chronic diastolic CHF, and progression of renal failure (prior renal transplant 2012) requiring initiation of hemodialysis.  On presentation, he initially required BiPAP. CXR showed reticulonodular opacities bilaterally which may reflect atypical/viral infection. He was treated with Solu-Medrol, ceftriaxone and azithromycin. EKG concerning for new onset afib; however, cardiology consulted and felt telemetry was more consistent with SR with 2nd degree type 1 AV block with low amplitude p waves. No significant afib burden noted. 01/15/23 echo showed LVEF 60-65%, no regional wall motion abnormalities, moderately reduced RV systolic function, moderately dilated LA, moderate mitral annular calcification without evidence of MR or MS, functionally bicuspid AV with  severe calcification, trivial AR, mild to moderate AS. On 01/18/23 he required renal replacement therapy for renal failure. On 01/21/23 he required intubation for progressive respiratory failure. He self extubated 01/23/12 and was stable on high flow [place], 55L with 75% FiO2, but developed progressive lethargy and hypotension requiring reintubation and norepinephrine infusion. He was extubated 01/26/23 and transitioned to midodrine. Volume status improved on dialysis with plans for HD MWF. Given arrhythmia in setting of acute illness without noted high afib burden, cardiology did not initially recommend out-patient anticoagulation; however, it appear subsequent EKGs still concerning for PAF so he was discharged on apixaban.    He had out-patient cardiology follow-up with Dr. Anne Fu on 05/27/23. He noted EKG then showed SR with 2nd degree AV block, type 1, right BBB (chronic). He wrote, "We had lengthy discussion today about Eliquis.  Atrial fibrillation could return.  We would like him to be protected to help reduce stroke risk.  Understands.  Obviously of bleeding significant occurs, we may need to stop the Eliquis.  Currently he is in sinus rhythm with second-degree heart block type I.  No changes made.  Continue to monitor.  Watch for any signs of worsening conduction disorder.  For now heart rate is normal." Six month follow-up is planned.    Last pulmonology visit with Dr. Vassie Loll was on 05/01/23 for OSA and chronic respiratory failure follow-up. Note summaries his respiratory history as: "Polysomnogram at [facility] in 02/2012 showed moderate obstructive sleep apnea with AHI of 25 events per hour, lowest desaturation of 71%. Unfortunately he did not tolerate CPAP on a subsequent titration study.   -admitted 03/2013 with acute hypercarbic resp failure in setting H Flu HCAP (similar recent admit February at [facility]) and likely decompensated OSA. Required mechanical ventilation Course c/b acute on chronic renal  insufficiency, poor  glycemic control and back abscess with hx peptostreptococcus.    PFTs 02/2014  showed severe restriction with a ratio 77, FVC 1.35-32%, FEV1 1.04-32% and TLC of 3.0 L-46% with DLCO at 52% that corrects for alveolar volumes suggestive of extra parenchymal restriction. Sniff test showed minimal mobility of diaphragms.    Nocturnal oximetry showed severe desaturation on 2 L of oxygen with a pattern suggestive hypoventilation, he spent 393 minutes with sats less than 88%.    BiPAP titration- 218 pounds- 13/9 cm with a medium fullface mask with 2 liters of oxygen blended  >>04/2014 could not tolerate high pressure, hence changed to autobipap, finally decreased to 10/5 cm"  He is currently compliant with O2 blended into BiPAP with O2. Follow-up in six month planned with nocturnal oximetry BiPAP/room air prior to visit.    Staff to contact patient for pre-operative interview/instructions. Per posting, [facility] has advised that he hold Eliquis for 2 days prior to surgery. Anesthesia team to evaluate on the day of surgery.   VS:  Wt Readings from Last 3 Encounters:  08/19/23 77.1 kg  05/27/23 78.2 kg  05/01/23 78.5 kg   BP Readings from Last 3 Encounters:  08/19/23 111/71  05/27/23 116/62  05/01/23 128/64   Pulse Readings from Last 3 Encounters:  08/19/23 93  05/27/23 93  05/01/23 94     PROVIDERS: Daisy Floro, MD is PCP  Donato Schultz, MD is cardiologist Cyril Mourning, MD is pulmonologist Crista Elliot, MD is nephrologist Gaynelle Arabian, MD is urologist   LABS: For day of surgery as indicated. Last results in [facility] include: Lab Results  Component Value Date   WBC 8.6 01/29/2023   HGB 11.5 (L) 01/29/2023   HCT 36.5 (L) 01/29/2023   PLT 71 (L) 01/29/2023   GLUCOSE 178 (H) 01/30/2023   ALT 26 01/21/2023   AST 18 01/21/2023   NA 133 (L) 01/30/2023   K 3.9 01/30/2023   CL 90 (L) 01/30/2023   CREATININE 3.61 (H) 01/30/2023   BUN 35 (H) 01/30/2023   CO2 29  01/30/2023   TSH 0.311 (L) 01/19/2023   INR 1.7 (H) 01/23/2023   HGBA1C 7.7 (H) 01/15/2023     IMAGES: CXR 04/17/23 ([facility]): FINDINGS:  . Supportive devices: No visualized internal supportive devices.  .  Cardiovascular/Mediastinum: Normal size and contours of the cardiomediastinal silhouette.  .  Lungs/Pleura: Low lung volumes with bibasilar linear opacities, favored to represent atelectasis. No focal consolidation. No pleural effusion. No pneumothorax.  .  Other: Visualized abdomen is unremarkable. No interval osseous abnormality.  No radiographic evidence of acute cardiopulmonary abnormality.    EKG: EKG 05/27/23 ([facility]): SR with 2nd degree AV block, type 1, right BBB (chronic)   CV: Echo 01/15/23: IMPRESSIONS   1. Left ventricular ejection fraction, by estimation, is 60 to 65%. The  left ventricle has normal function. The left ventricle has no regional  wall motion abnormalities. Left ventricular diastolic parameters are  indeterminate.   2. Right ventricular systolic function is moderately reduced. The right  ventricular size is normal.   3. Left atrial size was moderately dilated.   4. The mitral valve is abnormal. No evidence of mitral valve  regurgitation. No evidence of mitral stenosis. Moderate mitral annular  calcification.   5. Fucntionally bicuspid with fuse right and left cusps gradietns similar  to those obtained 02/18/21 . The aortic valve is normal in structure. There  is severe calcifcation of the aortic valve. There is severe thickening of  the aortic valve. Aortic valve   regurgitation is trivial. Mild to moderate aortic valve stenosis. Aortic valve mean gradient measures 17.0 mmHg. Aortic valve peak  gradient measures 27.9 mmHg. Aortic valve area, by VTI measures 1.64 cm.   6. The inferior vena cava is normal in size with greater than 50%  respiratory variability, suggesting right atrial pressure of 3 mmHg.    Past Medical History:  Diagnosis  Date   ADHD (attention deficit hyperactivity disorder)    Allergic rhinitis    Cancer (HCC)    Prostate cancer   Cellulitis and abscess of trunk 2014   history of back abscess   Cellulitis and abscess of unspecified site 2014   lower back   Detached retina 2003   Diabetes mellitus without complication (HCC)    Dysrhythmia    PAF, 2nd degree AV block, type 1   Elevated troponin 03/2013   Fistula    OF LEFT ARM   Gout    H/O kidney transplant    History of congestive heart failure    FOLLOWED BY DR. 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 Sherene Sires   Hypertension    Mild concentric left ventricular hypertrophy (LVH)    AND AORTIC STENOSIS FOLLOWED IN THE PAST BY CARDIOLOGIST DR. Anne Fu.   OSA treated with BiPAP    Personal history of colonic polyps    Prostate cancer (HCC) 11/2010   PROSTATECTOMY WITH UROLOGIST DR. DAVIS   Renal failure    Shortness of breath    Sleep disorder breathing    FOLLOWED BY PULMONOLOGIST DR. Vassie Loll    Past Surgical History:  Procedure Laterality Date   CATARACT EXTRACTION, BILATERAL  11/2014   COLONOSCOPY  09/2014   KIDNEY TRANSPLANT  12/01/2011   PROSTATECTOMY     FOR PROSTATE CANCER   prostectomy  11/2010   RETINAL DETACHMENT SURGERY  06/2002    MEDICATIONS: No current facility-administered medications for this encounter.    acetaminophen (TYLENOL) 325 MG tablet   allopurinol (ZYLOPRIM) 100 MG tablet   amoxicillin-clavulanate (AUGMENTIN) 500-125 MG tablet   apixaban (ELIQUIS) 2.5 MG TABS tablet   calcitRIOL (ROCALTROL) 0.25 MCG capsule   cinacalcet (SENSIPAR) 30 MG tablet   cyanocobalamin (VITAMIN B12) 1000 MCG tablet   famotidine (PEPCID) 20 MG tablet   folic acid (FOLVITE) 1 MG tablet   furosemide (LASIX) 40 MG tablet   insulin glargine (LANTUS) 100 UNIT/ML injection   loratadine (CLARITIN) 10 MG  tablet   Magnesium Cl-Calcium Carbonate (SLOW-MAG PO)   Melatonin 10 MG CAPS   metoprolol tartrate (LOPRESSOR) 25 MG tablet   multivitamin (RENA-VIT) TABS tablet   pravastatin (PRAVACHOL) 10 MG tablet   senna-docusate (SENOKOT-S) 8.6-50 MG tablet   tacrolimus (PROGRAF) 0.5 MG capsule   B-D ULTRAFINE III SHORT PEN 31G X 8 MM MISC   ONETOUCH ULTRA test strip    Shonna Chock, PA-C Surgical Short Stay/Anesthesiology [facility] Phone [phone] [facility] Phone [phone] 09/03/2023 10:34 AM

## 2023-09-04 ENCOUNTER — Ambulatory Visit (HOSPITAL_BASED_OUTPATIENT_CLINIC_OR_DEPARTMENT_OTHER): Payer: Medicare Other | Admitting: Vascular Surgery

## 2023-09-04 ENCOUNTER — Ambulatory Visit (HOSPITAL_COMMUNITY): Payer: Medicare Other | Admitting: Vascular Surgery

## 2023-09-04 ENCOUNTER — Other Ambulatory Visit: Payer: Self-pay

## 2023-09-04 ENCOUNTER — Encounter (HOSPITAL_COMMUNITY)
Admission: RE | Disposition: A | Discharge status: Home / Self Care | Payer: Self-pay | Source: Home / Self Care | Attending: Vascular Surgery

## 2023-09-04 ENCOUNTER — Emergency Department (HOSPITAL_COMMUNITY)
Admission: EM | Admit: 2023-09-04 | Discharge: 2023-09-04 | Disposition: A | Discharge status: Home / Self Care | Payer: Medicare Other | Source: Home / Self Care | Attending: Emergency Medicine | Admitting: Emergency Medicine

## 2023-09-04 ENCOUNTER — Ambulatory Visit (HOSPITAL_COMMUNITY)
Admission: RE | Admit: 2023-09-04 | Discharge: 2023-09-04 | Disposition: A | Discharge status: Home / Self Care | Payer: Medicare Other | Attending: Vascular Surgery | Admitting: Vascular Surgery

## 2023-09-04 ENCOUNTER — Encounter (HOSPITAL_COMMUNITY): Payer: Self-pay | Admitting: Vascular Surgery

## 2023-09-04 DIAGNOSIS — T8189XA Other complications of procedures, not elsewhere classified, initial encounter: Secondary | ICD-10-CM | POA: Insufficient documentation

## 2023-09-04 DIAGNOSIS — I4891 Unspecified atrial fibrillation: Secondary | ICD-10-CM | POA: Insufficient documentation

## 2023-09-04 DIAGNOSIS — N186 End stage renal disease: Secondary | ICD-10-CM | POA: Insufficient documentation

## 2023-09-04 DIAGNOSIS — E1122 Type 2 diabetes mellitus with diabetic chronic kidney disease: Secondary | ICD-10-CM | POA: Insufficient documentation

## 2023-09-04 DIAGNOSIS — I4510 Unspecified right bundle-branch block: Secondary | ICD-10-CM | POA: Insufficient documentation

## 2023-09-04 DIAGNOSIS — Z8546 Personal history of malignant neoplasm of prostate: Secondary | ICD-10-CM | POA: Diagnosis not present

## 2023-09-04 DIAGNOSIS — I5033 Acute on chronic diastolic (congestive) heart failure: Secondary | ICD-10-CM | POA: Diagnosis not present

## 2023-09-04 DIAGNOSIS — N185 Chronic kidney disease, stage 5: Secondary | ICD-10-CM | POA: Diagnosis not present

## 2023-09-04 DIAGNOSIS — I441 Atrioventricular block, second degree: Secondary | ICD-10-CM | POA: Diagnosis not present

## 2023-09-04 DIAGNOSIS — G4733 Obstructive sleep apnea (adult) (pediatric): Secondary | ICD-10-CM | POA: Insufficient documentation

## 2023-09-04 DIAGNOSIS — T8131XA Disruption of external operation (surgical) wound, not elsewhere classified, initial encounter: Secondary | ICD-10-CM | POA: Insufficient documentation

## 2023-09-04 DIAGNOSIS — Z992 Dependence on renal dialysis: Secondary | ICD-10-CM | POA: Insufficient documentation

## 2023-09-04 DIAGNOSIS — Z7901 Long term (current) use of anticoagulants: Secondary | ICD-10-CM | POA: Insufficient documentation

## 2023-09-04 DIAGNOSIS — Z794 Long term (current) use of insulin: Secondary | ICD-10-CM | POA: Insufficient documentation

## 2023-09-04 DIAGNOSIS — Z87891 Personal history of nicotine dependence: Secondary | ICD-10-CM

## 2023-09-04 DIAGNOSIS — I499 Cardiac arrhythmia, unspecified: Secondary | ICD-10-CM | POA: Diagnosis not present

## 2023-09-04 DIAGNOSIS — I509 Heart failure, unspecified: Secondary | ICD-10-CM | POA: Insufficient documentation

## 2023-09-04 DIAGNOSIS — I5032 Chronic diastolic (congestive) heart failure: Secondary | ICD-10-CM | POA: Diagnosis not present

## 2023-09-04 DIAGNOSIS — R000 Tachycardia, unspecified: Secondary | ICD-10-CM | POA: Insufficient documentation

## 2023-09-04 DIAGNOSIS — I132 Hypertensive heart and chronic kidney disease with heart failure and with stage 5 chronic kidney disease, or end stage renal disease: Secondary | ICD-10-CM | POA: Insufficient documentation

## 2023-09-04 DIAGNOSIS — I5023 Acute on chronic systolic (congestive) heart failure: Secondary | ICD-10-CM | POA: Diagnosis not present

## 2023-09-04 DIAGNOSIS — D649 Anemia, unspecified: Secondary | ICD-10-CM | POA: Insufficient documentation

## 2023-09-04 DIAGNOSIS — Z940 Kidney transplant status: Secondary | ICD-10-CM | POA: Insufficient documentation

## 2023-09-04 DIAGNOSIS — Z743 Need for continuous supervision: Secondary | ICD-10-CM | POA: Diagnosis not present

## 2023-09-04 DIAGNOSIS — Z79899 Other long term (current) drug therapy: Secondary | ICD-10-CM | POA: Insufficient documentation

## 2023-09-04 DIAGNOSIS — T8130XA Disruption of wound, unspecified, initial encounter: Secondary | ICD-10-CM

## 2023-09-04 DIAGNOSIS — R739 Hyperglycemia, unspecified: Secondary | ICD-10-CM | POA: Diagnosis not present

## 2023-09-04 HISTORY — PX: CAPD INSERTION: SHX5233

## 2023-09-04 HISTORY — DX: Cardiac arrhythmia, unspecified: I49.9

## 2023-09-04 HISTORY — PX: LAPAROSCOPIC LYSIS OF ADHESIONS: SHX5905

## 2023-09-04 LAB — CBC WITH DIFFERENTIAL/PLATELET
Abs Immature Granulocytes: 0.04 10*3/uL (ref 0.00–0.07)
Basophils Absolute: 0 10*3/uL (ref 0.0–0.1)
Basophils Relative: 0 %
Eosinophils Absolute: 0 10*3/uL (ref 0.0–0.5)
Eosinophils Relative: 0 %
HCT: 37.3 % — ABNORMAL LOW (ref 39.0–52.0)
Hemoglobin: 11.9 g/dL — ABNORMAL LOW (ref 13.0–17.0)
Immature Granulocytes: 1 %
Lymphocytes Relative: 5 %
Lymphs Abs: 0.5 10*3/uL — ABNORMAL LOW (ref 0.7–4.0)
MCH: 31.3 pg (ref 26.0–34.0)
MCHC: 31.9 g/dL (ref 30.0–36.0)
MCV: 98.2 fL (ref 80.0–100.0)
Monocytes Absolute: 0.4 10*3/uL (ref 0.1–1.0)
Monocytes Relative: 5 %
Neutro Abs: 7.5 10*3/uL (ref 1.7–7.7)
Neutrophils Relative %: 89 %
Platelets: 138 10*3/uL — ABNORMAL LOW (ref 150–400)
RBC: 3.8 MIL/uL — ABNORMAL LOW (ref 4.22–5.81)
RDW: 12.5 % (ref 11.5–15.5)
WBC: 8.5 10*3/uL (ref 4.0–10.5)
nRBC: 0 % (ref 0.0–0.2)

## 2023-09-04 LAB — POCT I-STAT, CHEM 8
BUN: 48 mg/dL — ABNORMAL HIGH (ref 8–23)
Calcium, Ion: 1.13 mmol/L — ABNORMAL LOW (ref 1.15–1.40)
Chloride: 99 mmol/L (ref 98–111)
Creatinine, Ser: 4 mg/dL — ABNORMAL HIGH (ref 0.61–1.24)
Glucose, Bld: 160 mg/dL — ABNORMAL HIGH (ref 70–99)
HCT: 40 % (ref 39.0–52.0)
Hemoglobin: 13.6 g/dL (ref 13.0–17.0)
Potassium: 4.2 mmol/L (ref 3.5–5.1)
Sodium: 137 mmol/L (ref 135–145)
TCO2: 28 mmol/L (ref 22–32)

## 2023-09-04 LAB — GLUCOSE, CAPILLARY
Glucose-Capillary: 148 mg/dL — ABNORMAL HIGH (ref 70–99)
Glucose-Capillary: 192 mg/dL — ABNORMAL HIGH (ref 70–99)

## 2023-09-04 SURGERY — LAPAROSCOPIC INSERTION CONTINUOUS AMBULATORY PERITONEAL DIALYSIS  (CAPD) CATHETER
Anesthesia: General | Site: Abdomen

## 2023-09-04 MED ORDER — SODIUM CHLORIDE 0.9 % IV SOLN: INTRAVENOUS | Status: DC

## 2023-09-04 MED ORDER — INSULIN ASPART 100 UNIT/ML IJ SOLN: 0.0000 [IU] | INTRAMUSCULAR | Status: DC | PRN

## 2023-09-04 MED ORDER — FENTANYL CITRATE (PF) 100 MCG/2ML IJ SOLN
25.0000 ug | INTRAMUSCULAR | Status: DC | PRN
  Administered 2023-09-04: 50 ug via INTRAVENOUS

## 2023-09-04 MED ORDER — CHLORHEXIDINE GLUCONATE 4 % EX SOLN: 60.0000 mL | Freq: Once | CUTANEOUS | Status: DC

## 2023-09-04 MED ORDER — APIXABAN 2.5 MG PO TABS: 2.5000 mg | ORAL_TABLET | Freq: Two times a day (BID) | ORAL | Status: AC

## 2023-09-04 MED ORDER — SODIUM CHLORIDE 0.9 % IV BOLUS
500.0000 mL | Freq: Once | INTRAVENOUS | Status: AC
  Administered 2023-09-04: 500 mL via INTRAVENOUS

## 2023-09-04 MED ORDER — ROCURONIUM BROMIDE 10 MG/ML (PF) SYRINGE
PREFILLED_SYRINGE | INTRAVENOUS | Status: DC | PRN
  Administered 2023-09-04: 50 mg via INTRAVENOUS

## 2023-09-04 MED ORDER — DROPERIDOL 2.5 MG/ML IJ SOLN
0.6250 mg | Freq: Once | INTRAMUSCULAR | Status: AC | PRN
  Administered 2023-09-04: 0.625 mg via INTRAVENOUS

## 2023-09-04 MED ORDER — OXYCODONE HCL 5 MG PO TABS: 5.0000 mg | ORAL_TABLET | Freq: Once | ORAL | Status: DC | PRN

## 2023-09-04 MED ORDER — OXYCODONE-ACETAMINOPHEN 5-325 MG PO TABS
1.0000 | ORAL_TABLET | Freq: Four times a day (QID) | ORAL | 0 refills | Status: AC | PRN
Start: 2023-09-04 — End: ?

## 2023-09-04 MED ORDER — ORAL CARE MOUTH RINSE: 15.0000 mL | Freq: Once | OROMUCOSAL | Status: AC

## 2023-09-04 MED ORDER — SUGAMMADEX SODIUM 200 MG/2ML IV SOLN
INTRAVENOUS | Status: DC | PRN
  Administered 2023-09-04: 200 mg via INTRAVENOUS

## 2023-09-04 MED ORDER — FENTANYL CITRATE (PF) 250 MCG/5ML IJ SOLN
INTRAMUSCULAR | Status: AC
  Filled 2023-09-04: qty 5

## 2023-09-04 MED ORDER — PROPOFOL 10 MG/ML IV BOLUS
INTRAVENOUS | Status: AC
  Filled 2023-09-04: qty 20

## 2023-09-04 MED ORDER — 0.9 % SODIUM CHLORIDE (POUR BTL) OPTIME
TOPICAL | Status: DC | PRN
  Administered 2023-09-04: 1000 mL

## 2023-09-04 MED ORDER — LIDOCAINE HCL (PF) 1 % IJ SOLN
INTRAMUSCULAR | Status: AC
  Administered 2023-09-04: 5 mL via INTRADERMAL
  Filled 2023-09-04: qty 5

## 2023-09-04 MED ORDER — SODIUM CHLORIDE 0.9 % IR SOLN
Status: DC | PRN
  Administered 2023-09-04: 1

## 2023-09-04 MED ORDER — ONDANSETRON HCL 4 MG/2ML IJ SOLN
INTRAMUSCULAR | Status: DC | PRN
  Administered 2023-09-04: 4 mg via INTRAVENOUS

## 2023-09-04 MED ORDER — PROPOFOL 10 MG/ML IV BOLUS
INTRAVENOUS | Status: DC | PRN
  Administered 2023-09-04: 130 mg via INTRAVENOUS

## 2023-09-04 MED ORDER — FENTANYL CITRATE (PF) 250 MCG/5ML IJ SOLN
INTRAMUSCULAR | Status: DC | PRN
  Administered 2023-09-04 (×2): 50 ug via INTRAVENOUS

## 2023-09-04 MED ORDER — LIDOCAINE HCL (PF) 1 % IJ SOLN: 5.0000 mL | Freq: Once | INTRAMUSCULAR | Status: AC

## 2023-09-04 MED ORDER — CHLORHEXIDINE GLUCONATE 0.12 % MT SOLN: 15.0000 mL | Freq: Once | OROMUCOSAL | Status: AC

## 2023-09-04 MED ORDER — ACETAMINOPHEN 10 MG/ML IV SOLN
INTRAVENOUS | Status: AC
  Filled 2023-09-04: qty 100

## 2023-09-04 MED ORDER — DEXAMETHASONE SODIUM PHOSPHATE 10 MG/ML IJ SOLN
INTRAMUSCULAR | Status: DC | PRN
  Administered 2023-09-04: 5 mg via INTRAVENOUS

## 2023-09-04 MED ORDER — DROPERIDOL 2.5 MG/ML IJ SOLN
INTRAMUSCULAR | Status: AC
  Filled 2023-09-04: qty 2

## 2023-09-04 MED ORDER — ACETAMINOPHEN 10 MG/ML IV SOLN
1000.0000 mg | Freq: Once | INTRAVENOUS | Status: DC | PRN
  Administered 2023-09-04: 1000 mg via INTRAVENOUS

## 2023-09-04 MED ORDER — FENTANYL CITRATE (PF) 100 MCG/2ML IJ SOLN
INTRAMUSCULAR | Status: AC
  Filled 2023-09-04: qty 2

## 2023-09-04 MED ORDER — CEFAZOLIN SODIUM-DEXTROSE 2-4 GM/100ML-% IV SOLN
2.0000 g | INTRAVENOUS | Status: AC
  Administered 2023-09-04: 2 g via INTRAVENOUS
  Filled 2023-09-04: qty 100

## 2023-09-04 MED ORDER — CHLORHEXIDINE GLUCONATE 0.12 % MT SOLN
OROMUCOSAL | Status: AC
  Administered 2023-09-04: 15 mL via OROMUCOSAL
  Filled 2023-09-04: qty 15

## 2023-09-04 MED ORDER — OXYCODONE HCL 5 MG/5ML PO SOLN: 5.0000 mg | Freq: Once | ORAL | Status: DC | PRN

## 2023-09-04 SURGICAL SUPPLY — 59 items
ADAPTER TITANIUM MEDIONICS (MISCELLANEOUS) ×3 IMPLANT
ADH SKN CLS APL DERMABOND .7 (GAUZE/BANDAGES/DRESSINGS) ×2
ADPR DLYS CATH STRL LF DISP (MISCELLANEOUS) ×2
APL PRP STRL LF DISP 70% ISPRP (MISCELLANEOUS) ×2
APPLIER CLIP 5 13 M/L LIGAMAX5 (MISCELLANEOUS)
APR CLP MED LRG 5 ANG JAW (MISCELLANEOUS)
BAG DECANTER FOR FLEXI CONT (MISCELLANEOUS) ×3 IMPLANT
BIOPATCH RED 1 DISK 7.0 (GAUZE/BANDAGES/DRESSINGS) ×3 IMPLANT
BLADE CLIPPER SURG (BLADE) IMPLANT
BLADE SURG 11 STRL SS (BLADE) ×3 IMPLANT
CATH EXTENDED DIALYSIS (CATHETERS) IMPLANT
CHLORAPREP W/TINT 26 (MISCELLANEOUS) ×3 IMPLANT
CLIP APPLIE 5 13 M/L LIGAMAX5 (MISCELLANEOUS) IMPLANT
COVER SURGICAL LIGHT HANDLE (MISCELLANEOUS) ×3 IMPLANT
DERMABOND ADVANCED .7 DNX12 (GAUZE/BANDAGES/DRESSINGS) ×3 IMPLANT
DEVICE TROCAR PUNCTURE CLOSURE (ENDOMECHANICALS) ×3 IMPLANT
DRSG TEGADERM 4X4.75 (GAUZE/BANDAGES/DRESSINGS) ×9 IMPLANT
ELECT REM PT RETURN 9FT ADLT (ELECTROSURGICAL) ×2
ELECTRODE REM PT RTRN 9FT ADLT (ELECTROSURGICAL) ×3 IMPLANT
GAUZE SPONGE 4X4 12PLY STRL (GAUZE/BANDAGES/DRESSINGS) ×3 IMPLANT
GLOVE INDICATOR 6.5 STRL GRN (GLOVE) ×3 IMPLANT
GLOVE SURG UNDER LTX SZ7.5 (GLOVE) ×3 IMPLANT
GOWN STRL REUS W/ TWL LRG LVL3 (GOWN DISPOSABLE) ×6 IMPLANT
GOWN STRL REUS W/ TWL XL LVL3 (GOWN DISPOSABLE) ×3 IMPLANT
GOWN STRL REUS W/TWL LRG LVL3 (GOWN DISPOSABLE) ×4
GOWN STRL REUS W/TWL XL LVL3 (GOWN DISPOSABLE) ×2
GRASPER SUT TROCAR 14GX15 (MISCELLANEOUS) ×3 IMPLANT
IRRIG SUCT STRYKERFLOW 2 WTIP (MISCELLANEOUS)
IRRIGATION SUCT STRKRFLW 2 WTP (MISCELLANEOUS) IMPLANT
IV NS 1000ML (IV SOLUTION) ×2
IV NS 1000ML BAXH (IV SOLUTION) ×3 IMPLANT
KIT BASIN OR (CUSTOM PROCEDURE TRAY) ×3 IMPLANT
KIT TURNOVER KIT B (KITS) ×3 IMPLANT
NDL INSUFFLATION 14GA 120MM (NEEDLE) ×3 IMPLANT
NEEDLE INSUFFLATION 14GA 120MM (NEEDLE) ×2 IMPLANT
NS IRRIG 1000ML POUR BTL (IV SOLUTION) ×3 IMPLANT
PAD ARMBOARD 7.5X6 YLW CONV (MISCELLANEOUS) ×6 IMPLANT
POWDER SURGICEL 3.0 GRAM (HEMOSTASIS) IMPLANT
SCISSORS LAP 5X35 DISP (ENDOMECHANICALS) IMPLANT
SET CYSTO W/LG BORE CLAMP LF (SET/KITS/TRAYS/PACK) ×3 IMPLANT
SET EXT 12IN DIALYSIS STAY-SAF (MISCELLANEOUS) ×3 IMPLANT
SET TUBE SMOKE EVAC HIGH FLOW (TUBING) ×3 IMPLANT
SLEEVE Z-THREAD 5X100MM (TROCAR) ×6 IMPLANT
SPIKE FLUID TRANSFER (MISCELLANEOUS) ×3 IMPLANT
STYLET FALLER (MISCELLANEOUS) ×3 IMPLANT
STYLET FALLER MEDIONICS (MISCELLANEOUS) ×3 IMPLANT
SUT MNCRL AB 4-0 PS2 18 (SUTURE) ×3 IMPLANT
SUT PROLENE 0 SH 30 (SUTURE) ×6 IMPLANT
SUT SILK 0 TIES 10X30 (SUTURE) ×3 IMPLANT
SUT VICRYL 3 0 (SUTURE) IMPLANT
TAPE CLOTH SURG 4X10 WHT LF (GAUZE/BANDAGES/DRESSINGS) IMPLANT
TOWEL GREEN STERILE (TOWEL DISPOSABLE) ×3 IMPLANT
TOWEL GREEN STERILE FF (TOWEL DISPOSABLE) ×3 IMPLANT
TRAY LAPAROSCOPIC MC (CUSTOM PROCEDURE TRAY) ×3 IMPLANT
TROCAR 11X100 Z THREAD (TROCAR) IMPLANT
TROCAR 5MMX150MM (TROCAR) ×3 IMPLANT
TROCAR XCEL NON-BLD 5MMX100MML (ENDOMECHANICALS) ×3 IMPLANT
TROCAR Z-THREAD OPTICAL 5X100M (TROCAR) ×3 IMPLANT
WATER STERILE IRR 1000ML POUR (IV SOLUTION) ×3 IMPLANT

## 2023-09-04 NOTE — Discharge Instructions (Addendum)
Peritoneal Dialysis Catheter Placement, Care After  Please flush peritoneal catheter on 09/05/2023.  The following information offers guidance on how to care for yourself after your procedure. Your health care provider may also give you more specific instructions. If you have problems or questions, contact your health care provider. What can I expect after the procedure? After the procedure, it is common to have some pain or discomfort in your abdomen and your incision area. You may need to wait 2 weeks after your procedure before you can start peritoneal dialysis treatment. If you need dialysis before that time, your health care provider may begin peritoneal dialysis treatment early or offer kidney dialysis treatments (hemodialysis) until you heal. Follow these instructions at home: Incision care  Follow instructions from your health care provider about how to take care of your incision or incisions. Make sure you: Wash your hands with soap and water for at least 20 seconds before and after you change your bandage (dressing). If soap and water are not available, use hand sanitizer. Change your dressing only as told by your health care provider. Your health care provider may tell you not to touch or change your dressing. Leave stitches (sutures), staples, skin glue, or adhesive strips in place. These skin closures may need to stay in place for 2 weeks or longer. If adhesive strip edges start to loosen and curl up, you may trim the loose edges. Do not remove adhesive strips completely unless your health care provider tells you to do that. Check your incision areas every day for signs of infection. If you were instructed not to touch or change your dressing, look at your dressing for signs of infection. Check for: Redness, swelling, or more pain. Fluid or blood. Warmth. Pus or a bad smell. Medicines Take over-the-counter and prescription medicines only as told by your health care provider.   Do  not take Tylenol while taking pain medication.  RESTART YOUR ELIQUIS ON 09/06/2023 Ask your health care provider if the medicine prescribed to you requires you to avoid driving or using machinery. Driving Do not drive or ride in a car until your health care provider approves. Your seat belt could move the catheter out of position or cause irritation by rubbing on your incision. Activity  Rest and limit your activity. Do not lift anything that is heavier than 10 lb (4.5 kg), or the limit that you are told, until your health care provider says that it is safe. Return to your normal activities as told by your health care provider. Ask your health care provider what activities are safe for you. Managing constipation Your condition may cause constipation. To prevent or treat constipation, you may need to: Drink enough fluid to keep your urine pale yellow. Take over-the-counter or prescription medicines. Eat foods that are high in fiber, such as beans, whole grains, and fresh fruits and vegetables. Limit foods that are high in fat and processed sugars, such as fried or sweet foods. General instructions Do not use any products that contain nicotine or tobacco. These products include cigarettes, chewing tobacco, and vaping devices, such as e-cigarettes. If you need help quitting, ask your health care provider. Follow instructions from your health care provider about eating or drinking restrictions. Do not take baths, swim, or use a hot tub until your health care provider approves. Ask your health care provider if you may take showers. You may only be allowed to take sponge baths. Wear loose-fitting clothing that keeps the catheter covered so that  it cannot get caught on something. Keep your catheter clean and dry. Keep all follow-up visits. This is important. Contact a health care provider if: You have a fever or chills. You have warmth, redness, swelling, or more pain around an incision. You have  fluid or blood coming from an incision. You have pus or a bad smell coming from an incision. You cannot eat or drink without vomiting. Get help right away if: You have problems breathing. You are confused. You have trouble speaking. You have severe pain in your abdomen that does not get better with treatment. You have bright red blood in your stool (feces), or your stool is dark black and looks like tar. These symptoms may represent a serious problem that is an emergency. Do not wait to see if the symptoms will go away. Get medical help right away. Call your local emergency services (911 in the [place] Do not drive yourself to the hospital. Summary After the procedure, it is common to have some pain or discomfort in your abdomen, your incision area, or both. You may have to wait 2 weeks after your procedure before you can start peritoneal dialysis treatment. Check your incision area every day for signs of infection. Get medical help right away if you have severe pain in your abdomen that does not get better with treatment. This information is not intended to replace advice given to you by your health care provider. Make sure you discuss any questions you have with your health care provider.

## 2023-09-04 NOTE — Op Note (Signed)
DATE OF SERVICE: 09/04/2023  PATIENT:  Russell Bowers  72 y.o. male  PRE-OPERATIVE DIAGNOSIS:  ESRD  POST-OPERATIVE DIAGNOSIS:  Same  PROCEDURE:   1) laparoscopic lysis of adhesions 2) laparoscopic omentopexy 3) laparoscopic peritoneal dialysis catheter placement  SURGEON:  Surgeons and Role:    * Leonie Douglas, MD - Primary  ASSISTANT: Doreatha Massed, PA-C  An experienced assistant was required given the complexity of this procedure and the standard of surgical care. My assistant helped with exposure through counter tension, suctioning, ligation and retraction to better visualize the surgical field.  My assistant expedited sewing during the case by following my sutures. Wherever I use the term "we" in the report, my assistant actively helped me with that portion of the procedure.  ANESTHESIA:   general  EBL: minimal  BLOOD ADMINISTERED:none  DRAINS: none   LOCAL MEDICATIONS USED:  NONE  SPECIMEN:  none  COUNTS: confirmed correct.  TOURNIQUET:  none  PATIENT DISPOSITION:  PACU - hemodynamically stable.   Delay start of Pharmacological VTE agent (>24hrs) due to surgical blood loss or risk of bleeding: no  INDICATION FOR PROCEDURE: CALON FILAR is a 72 y.o. male with ESRD in need of permanent dialysis access. After careful discussion of risks, benefits, and alternatives the patient was offered laparoscopic peritoneal dialysis catheter. The patient understood and wished to proceed.  OPERATIVE FINDINGS: significant scar in abdomen. Successful lysis of adhesions. Successful omentopexy. Successful placement of PD catheter. Catheter flushed and drained appropriately in OR.  DESCRIPTION OF PROCEDURE: After identification of the patient in the pre-operative holding area, the patient was transferred to the operating room. The patient was positioned supine on the operating room table. Anesthesia was induced. The abdomen was prepped and draped in standard fashion. A  surgical pause was performed confirming correct patient, procedure, and operative location.  A Veress needle was introduced into the abdomen at Palmer's point, immediately below the left costal margin.  A saline drop test was used to confirm intra-abdominal position.  The Veress needle was connected to insufflation tubing and insufflation initiated.  A low opening pressure and good flow rate were noted.  A 5 mm trocar was introduced into the right upper quadrant using Visi-View technique.  The obturator was removed and the abdomen inspected with a 30 degree angled laparoscope.  No evidence of Veress needle injury was noted.  An additional 5 mm trocar was inserted a handsbreadth away to facilitate the case.  Significant adhesions were seen throughout the peritoneum. Thankfully these were flimsy and did not involve the bowel. The scar was taken down bluntly with an atraumatic grasper.   The omentum was grasped with an atraumatic grasper and elevated to the upper abdomen.  A laparoscopic suture passer was used to deliver a 0 Prolene suture through the omentum.  The suture was secured down to the anterior abdominal wall with a knot.  The omentum was pexied in place.  The abdomen was desufflated.  The peritoneal catheter was measured across the abdominal wall surface.  A mark was made by the cuff in the periumbilical abdomen.  The abdomen was reinsufflated.  An advantage 5 mm trocar was then used to create a skiving path through the anterior rectus fascia, rectus muscle, and peritoneum.  The laparoscopic trocar entered in the suprapubic abdomen directing towards the pelvis.  The working end of the catheter was delivered through the trocar.  The cuff was brought into the abdomen and then placed back into the rectus muscle.  The abdomen was desufflated again.  A "swan-neck" extension tubing for the dialysis catheter was brought onto the field.  The catheter was laid across the desufflated abdomen to lay across  the upper quadrant and exit the abdomen.  The catheter was cut.  The 2 pieces of catheter were then connected using a Christmas tree adapter.  This was secured in place with 2 interrupted sutures of 0 Prolene.  The connected catheter was then tunneled to a counterincision in the upper quadrant and then out the lateral abdomen in a downward deflection.  Great care was taken to avoid twisting or kinking the catheter.  The abdomen was reinsufflated.  The peritoneum was inspected to ensure the catheter did not enter the cavity.  Satisfied we desufflated the abdomen.  The catheter was connected to cystoscopy tubing.  The catheter flushed and drained without any difficulty.  The trochars were removed.  All incisions were closed with interrupted 4-0 Monocryl sutures.  Dermabond was applied.  A sterile bandage was applied to the peritoneal dialysis catheter.   Upon completion of the case instrument and sharps counts were confirmed correct. The patient was transferred to the PACU in good condition. I was present for all portions of the procedure.  FOLLOW UP PLAN: Encouraged to start flushing the catheter as soon as possible given lysis of adhesions. Follow up with me for any catheter related issues on an as needed basis.  Rande Brunt. Lenell Antu, MD [facility] Phone Number: [phone] 09/04/2023 10:42 AM

## 2023-09-04 NOTE — ED Triage Notes (Signed)
Patient comes in after having a catheter placed. He has had intermittent bleeding from noon onwards. He states that he is on Eliquis. The bleeding is controlled at this time he is holding pressure with an ABD pad. There is no bleeding at this time. He has no pain right now. A&Ox4 and ambulatory at baseline.

## 2023-09-04 NOTE — ED Provider Notes (Signed)
[place] [facility] AT [facility] Provider Note   CSN: [id number] Arrival date & time: 09/04/23  1929     History  Chief Complaint  Patient presents with   Post-op Problem    Russell Bowers is a 72 y.o. male.  HPI Patient reports that this morning he had a peritoneal dialysis catheter placed.  He initially went home and was feeling okay however he then noticed bleeding from one of the surgical sites.  This has not stopped despite his holding pressure so he came to the emergency department to be evaluated.    Home Medications Prior to Admission medications   Medication Sig Start Date End Date Taking? Authorizing Provider  acetaminophen (TYLENOL) 325 MG tablet Take 325-650 mg by mouth every 6 (six) hours as needed for moderate pain.    [provider]  allopurinol (ZYLOPRIM) 100 MG tablet Take 100 mg by mouth daily. 07/30/22   [provider]  amoxicillin-clavulanate (AUGMENTIN) 500-125 MG tablet Take 1 tablet by mouth 2 (two) times daily. 08/18/23   [provider]  apixaban (ELIQUIS) 2.5 MG TABS tablet Take 1 tablet (2.5 mg total) by mouth 2 (two) times daily. 09/06/23   Rhyne, Samantha J, PA-C  B-D ULTRAFINE III SHORT PEN 31G X 8 MM MISC Inject into the skin daily. 04/11/21   [provider]  calcitRIOL (ROCALTROL) 0.25 MCG capsule Take 0.25 mcg by mouth every Monday, Wednesday, and Friday with hemodialysis. 11/08/20   [provider]  cinacalcet (SENSIPAR) 30 MG tablet Take 30 mg by mouth every other day.    [provider]  cyanocobalamin (VITAMIN B12) 1000 MCG tablet Take 1,000 mcg by mouth daily.    [provider]  famotidine (PEPCID) 20 MG tablet Take 20 mg by mouth daily.    [provider]  folic acid (FOLVITE) 1 MG tablet TAKE 2 TABLETS BY MOUTH EVERY DAY 04/24/23   Josph Macho, MD  furosemide (LASIX) 40 MG tablet Take 40 mg by mouth 4 (four) times a week. Ever[facility] Bio, Thurs, and  Sat (non-dialysis days)    [provider]  insulin glargine (LANTUS) 100 UNIT/ML injection Inject 0.1 mLs (10 Units total) into the skin at bedtime. Patient taking differently: Inject 10-20 Units into the skin at bedtime. 01/30/23   Arrien, York Ram, MD  loratadine (CLARITIN) 10 MG tablet Take 10 mg by mouth daily.    [provider]  Magnesium Cl-Calcium Carbonate (SLOW-MAG PO) Take 1 tablet by mouth daily.     [provider]  Melatonin 10 MG CAPS Take 10 mg by mouth at bedtime as needed (sleep).    [provider]  metoprolol tartrate (LOPRESSOR) 25 MG tablet Take 25 mg by mouth 2 (two) times daily.    [provider]  multivitamin (RENA-VIT) TABS tablet Take 1 tablet by mouth daily.    [provider]  [facility] ULTRA test strip 3 (three) times daily. 02/26/21   [provider]  oxyCODONE-acetaminophen (PERCOCET) 5-325 MG tablet Take 1 tablet by mouth every 6 (six) hours as needed for severe pain. 09/04/23   Rhyne, Ames Coupe, PA-C  pravastatin (PRAVACHOL) 10 MG tablet Take 10 mg by mouth at bedtime.     [provider]  senna-docusate (SENOKOT-S) 8.6-50 MG tablet Take 1 tablet by mouth daily.    [provider]  tacrolimus (PROGRAF) 0.5 MG capsule Place 4 capsules (2 mg total) under the tongue 2 (two) times daily. Patient taking  differently: Place 1 mg under the tongue 2 (two) times daily. 01/30/23   Arrien, York Ram, MD      Allergies    Azathioprine, Myfortic [mycophenolate], Phenergan [promethazine hcl], and Pollen extract    Review of Systems   Review of Systems  Physical Exam Updated Vital Signs BP 104/71   Pulse (!) 102   Temp 98 F (36.7 C) (Oral)   Resp 17   SpO2 96%  Physical Exam Vitals and nursing note reviewed.  Constitutional:      General: He is not in acute distress.    Appearance: He is well-developed.  HENT:     Head: Normocephalic and atraumatic.  Eyes:      Conjunctiva/sclera: Conjunctivae normal.  Cardiovascular:     Rate and Rhythm: Regular rhythm. Tachycardia present.     Heart sounds: No murmur heard. Pulmonary:     Effort: Pulmonary effort is normal. No respiratory distress.     Breath sounds: Normal breath sounds.  Abdominal:     Palpations: Abdomen is soft.     Tenderness: There is no abdominal tenderness.     Comments: Multiple surgical incisions closed with Dermabond which are well-approximated and hemostatic.  Dehiscence of incision present just lateral to the umbilicus with oozing blood, no arterial bleeding noted.  Musculoskeletal:        General: No swelling.     Cervical back: Neck supple.  Skin:    General: Skin is warm and dry.     Capillary Refill: Capillary refill takes less than 2 seconds.  Neurological:     Mental Status: He is alert.  Psychiatric:        Mood and Affect: Mood normal.     ED Results / Procedures / Treatments   Labs (all labs ordered are listed, but only abnormal results are displayed) Labs Reviewed  CBC WITH DIFFERENTIAL/PLATELET - Abnormal; Notable for the following components:      Result Value   RBC 3.80 (*)    Hemoglobin 11.9 (*)    HCT 37.3 (*)    Platelets 138 (*)    Lymphs Abs 0.5 (*)    All other components within normal limits    EKG None  Radiology No results found.  Procedures Procedures    Medications Ordered in ED Medications  sodium chloride 0.9 % bolus 500 mL (0 mLs Intravenous Stopped 09/04/23 2218)  lidocaine (PF) (XYLOCAINE) 1 % injection 5 mL (5 mLs Intradermal Given 09/04/23 2048)    ED Course/ Medical Decision Making/ A&P                                 Medical Decision Making Amount and/or Complexity of Data Reviewed Labs: ordered.  Risk Prescription drug management.   Patient is a 72 year old male with a history of CHF, A-fib on Eliquis, OSA, T2DM, ESRD on HD presenting for postop complication.  On my initial evaluation, he is afebrile,  tachycardic but maintaining stable blood pressure, in no acute distress.  Reports that he noticed bleeding from surgical site earlier this afternoon which has not stopped despite pressure.  Reports that his last dose of Eliquis was on Monday.  On exam, there is oozing venous blood from dehisced surgical incision present just lateral to the umbilicus.  Obtain CBC to evaluate for anemia.  Hemoglobin is lower than prior but has had less than a 2 point drop which in the setting of surgery and  some blood loss is consistent.  IV fluid bolus given due to his tachycardia and decreased p.o. intake today.  Vascular surgery consulted.  Patient was evaluated in the emergency department by vascular surgery.  His incision was closed and hemostatic throughout the duration of his ED course.  Patient reports that he is feeling well and his heart rate has improved.  He does states that he desires discharge.  Given reassuring exam and workup as well as improvement in bleeding, feel this is appropriate.  He does have outpatient follow-up scheduled for tomorrow.  Discharged without further acute event.        Final Clinical Impression(s) / ED Diagnoses Final diagnoses:  Wound dehiscence    Rx / DC Orders ED Discharge Orders     None         Claretha Cooper, DO 09/05/23 1442    Lorre Nick, MD 09/05/23 [phone]

## 2023-09-04 NOTE — Consult Note (Signed)
[facility] [place]  ASSESSMENT / PLAN: 72 y.o. male with oozing from partially dehisced laparoscopic incision from earlier today. Wound cleansed and closed with 3-0 nylon x 3. Hemostatic after suturing. Bandage placed. As long as incision is hemostatic after lab work, ER evaluation, etc., patient may be discharged from my standpoint.  CHIEF COMPLAINT: bleeding from incision  HISTORY OF PRESENT ILLNESS: Russell Bowers is a 72 y.o. male who presents to [facility] ER for evaluation of bleeding after laparoscopic peritoneal dialysis catheter placement today. Patient has noted persistent bleeding from periumbilical incision. Bandages did not render the wound hemostatic.   Past Medical History:  Diagnosis Date   ADHD (attention deficit hyperactivity disorder)    Allergic rhinitis    Cancer (HCC)    Prostate cancer   Cellulitis and abscess of trunk 2014   history of back abscess   Cellulitis and abscess of unspecified site 2014   lower back   Detached retina 2003   Diabetes mellitus without complication (HCC)    Dysrhythmia    PAF, 2nd degree AV block, type 1   Elevated troponin 03/2013   Fistula    OF LEFT ARM   Gout    H/O kidney transplant    History of congestive heart failure    FOLLOWED BY DR. 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 Sherene Sires   Hypertension    Mild concentric left ventricular hypertrophy (LVH)    AND AORTIC STENOSIS FOLLOWED IN THE PAST BY CARDIOLOGIST DR. Anne Fu.   OSA treated with BiPAP    Personal history of colonic polyps    Prostate cancer (HCC) 11/2010   PROSTATECTOMY WITH UROLOGIST DR. DAVIS   Renal failure    Shortness of breath    Sleep disorder breathing    FOLLOWED BY PULMONOLOGIST DR. Vassie Loll    Past Surgical History:  Procedure Laterality Date   CATARACT EXTRACTION, BILATERAL  11/2014    COLONOSCOPY  09/2014   KIDNEY TRANSPLANT  12/01/2011   PROSTATECTOMY     FOR PROSTATE CANCER   prostectomy  11/2010   RETINAL DETACHMENT SURGERY  06/2002    Family History  Problem Relation Age of Onset   Diabetes Mother    Hypertension Mother    Atrial fibrillation Mother    Alzheimer's disease Father    Heart Problems Sister    Heart disease Maternal Grandfather    Heart disease Maternal Grandmother     Social History   Socioeconomic History   Marital status: Married    Spouse name: Not on file   Number of children: Not on file   Years of education: Not on file   Highest education level: Not on file  Occupational History   Occupation: Retired  Tobacco Use   Smoking status: Former    Current packs/day: 0.00    Average packs/day: 1 pack/day for 21.0 years (21.0 ttl pk-yrs)    Types: Cigarettes    Start date: 03/16/1972    Quit date: 03/16/1993    Years since quitting: 30.4   Smokeless tobacco: Never  Vaping Use   Vaping status: Never Used  Substance and Sexual Activity   Alcohol use: Yes    Alcohol/week: 1.0 - 2.0 standard drink of alcohol    Types: 1 - 2 Glasses of wine per week   Drug use: No   Sexual activity: Not on file  Other Topics Concern   Not on file  Social History Narrative   Not on file   Social Determinants of Health   Financial Resource Strain: Not on file  Food Insecurity: No Food Insecurity (01/25/2023)   Hunger Vital Sign    Worried About Running Out of Food in the Last Year: Never true    Ran Out of Food in the Last Year: Never true  Transportation Needs: No Transportation Needs (01/25/2023)   PRAPARE - Administrator, Civil Service (Medical): No    Lack of Transportation (Non-Medical): No  Physical Activity: Not on file  Stress: Not on file  Social Connections: Not on file  Intimate Partner Violence: Not At Risk (01/25/2023)   Humiliation, Afraid, Rape, and Kick questionnaire    Fear of Current or Ex-Partner: No     Emotionally Abused: No    Physically Abused: No    Sexually Abused: No    Allergies  Allergen Reactions   Azathioprine Other (See Comments)    Suppressed bone marrow, couldn't make blood   Myfortic [Mycophenolate]     Digestive issues   Phenergan [Promethazine Hcl] Other (See Comments)    Retention/obtundation, needed intubation   Pollen Extract Other (See Comments)    stuffy nose    Current Facility-Administered Medications  Medication Dose Route Frequency Provider Last Rate Last Admin   lidocaine (PF) (XYLOCAINE) 1 % injection            lidocaine (PF) (XYLOCAINE) 1 % injection 5 mL  5 mL Intradermal Once Lorre Nick, MD       sodium chloride 0.9 % bolus 500 mL  500 mL Intravenous Once Claretha Cooper, DO       Current Outpatient Medications  Medication Sig Dispense Refill   acetaminophen (TYLENOL) 325 MG tablet Take 325-650 mg by mouth every 6 (six) hours as needed for moderate pain.     allopurinol (ZYLOPRIM) 100 MG tablet Take 100 mg by mouth daily.     amoxicillin-clavulanate (AUGMENTIN) 500-125 MG tablet Take 1 tablet by mouth 2 (two) times daily.     [START ON 09/06/2023] apixaban (ELIQUIS) 2.5 MG TABS tablet Take 1 tablet (2.5 mg total) by mouth 2 (two) times daily.     B-D ULTRAFINE III SHORT PEN 31G X 8 MM MISC Inject into the skin daily.     calcitRIOL (ROCALTROL) 0.25 MCG capsule Take 0.25 mcg by mouth every Monday, Wednesday, and Friday with hemodialysis.     cinacalcet (SENSIPAR) 30 MG tablet Take 30 mg by mouth every other day.     cyanocobalamin (VITAMIN B12) 1000 MCG tablet Take 1,000 mcg by mouth daily.     famotidine (PEPCID) 20 MG tablet Take 20 mg by mouth daily.     folic acid (FOLVITE) 1 MG tablet TAKE 2 TABLETS BY MOUTH EVERY DAY 180 tablet 4   furosemide (LASIX) 40 MG tablet Take 40 mg by mouth 4 (four) times a week. [facility] Bio, Thurs, and Sat (non-dialysis days)     insulin glargine (LANTUS) 100 UNIT/ML injection Inject 0.1 mLs (10 Units total) into  the skin at bedtime. (Patient taking differently: Inject 10-20 Units into the skin at bedtime.) 10 mL 11   loratadine (CLARITIN) 10 MG tablet Take 10 mg by mouth daily.     Magnesium Cl-Calcium Carbonate (SLOW-MAG PO) Take 1 tablet by mouth daily.      Melatonin 10 MG CAPS Take 10 mg by mouth at bedtime as needed (sleep).  metoprolol tartrate (LOPRESSOR) 25 MG tablet Take 25 mg by mouth 2 (two) times daily.     multivitamin (RENA-VIT) TABS tablet Take 1 tablet by mouth daily.     ONETOUCH ULTRA test strip 3 (three) times daily.     oxyCODONE-acetaminophen (PERCOCET) 5-325 MG tablet Take 1 tablet by mouth every 6 (six) hours as needed for severe pain. 12 tablet 0   pravastatin (PRAVACHOL) 10 MG tablet Take 10 mg by mouth at bedtime.      senna-docusate (SENOKOT-S) 8.6-50 MG tablet Take 1 tablet by mouth daily.     tacrolimus (PROGRAF) 0.5 MG capsule Place 4 capsules (2 mg total) under the tongue 2 (two) times daily. (Patient taking differently: Place 1 mg under the tongue 2 (two) times daily.)      PHYSICAL EXAM Vitals:   09/04/23 1935 09/04/23 1945 09/04/23 2000  BP: 107/69 100/71 108/73  Pulse: (!) 109 (!) 121 (!) 119  Resp: 16 (!) 24 (!) 23  Temp: 98 F (36.7 C)    TempSrc: Oral    SpO2: 97% 95% 95%   Elderly man in no distress Regular rate and rhythm Unlabored breathing Superficial dehiscence of periumbilical incision. Repaired with lidocaine infiltration and 3-O nylon suture. Hemostatic after nylons placed  PERTINENT LABORATORY AND RADIOLOGIC DATA  Most recent CBC    Latest Ref Rng & Units 09/04/2023    7:41 AM 01/29/2023    2:55 AM 01/28/2023   12:56 AM  CBC  WBC 4.0 - 10.5 K/uL  8.6  9.1   Hemoglobin 13.0 - 17.0 g/dL 96.2  95.2  84.1   Hematocrit 39.0 - 52.0 % 40.0  36.5  39.4   Platelets 150 - 400 K/uL  71  79      Most recent CMP    Latest Ref Rng & Units 09/04/2023    7:41 AM 01/30/2023    1:59 AM 01/29/2023    2:55 AM  CMP  Glucose 70 - 99 mg/dL 324  401  027    BUN 8 - 23 mg/dL 48  35  46   Creatinine 0.61 - 1.24 mg/dL 2.53  6.64  4.03   Sodium 135 - 145 mmol/L 137  133  131   Potassium 3.5 - 5.1 mmol/L 4.2  3.9  3.4   Chloride 98 - 111 mmol/L 99  90  93   CO2 22 - 32 mmol/L  29  27   Calcium 8.9 - 10.3 mg/dL  9.3  9.2     Renal function Estimated Creatinine Clearance: 16.7 mL/min (A) (by C-G formula based on SCr of 4 mg/dL (H)).  Hgb A1c MFr Bld (%)  Date Value  01/15/2023 7.7 (H)    Rande Brunt. Lenell Antu, MD [facility] Phone Number: [phone] 09/04/2023 8:45 PM   Total time spent on preparing this encounter including chart review, data review, collecting history, examining the patient, coordinating care for this established patient, 20 minutes.  Portions of this report may have been transcribed using voice recognition software.  Every effort has been made to ensure accuracy; however, inadvertent computerized transcription errors may still be present.

## 2023-09-04 NOTE — Interval H&P Note (Signed)
History and Physical Interval Note:  09/04/2023 7:30 AM  Russell Bowers  has presented today for surgery, with the diagnosis of ESRD.  The various methods of treatment have been discussed with the patient and family. After consideration of risks, benefits and other options for treatment, the patient has consented to  Procedure(s): LAPAROSCOPIC INSERTION CONTINUOUS AMBULATORY PERITONEAL DIALYSIS  (CAPD) CATHETER (N/A) POSSIBLE LAPAROSCOPIC OMENTOPEXY (N/A) as a surgical intervention.  The patient's history has been reviewed, patient examined, no change in status, stable for surgery.  I have reviewed the patient's chart and labs.  Questions were answered to the patient's satisfaction.     Leonie Douglas

## 2023-09-04 NOTE — Transfer of Care (Signed)
Immediate Anesthesia Transfer of Care Note  Patient: Russell Bowers  Procedure(s) Performed: LAPAROSCOPIC INSERTION CONTINUOUS AMBULATORY PERITONEAL DIALYSIS  (CAPD) CATHETER LAPAROSCOPIC OMENTOPEXY (Abdomen) LAPAROSCOPIC LYSIS OF ADHESIONS (Abdomen)  Patient Location: PACU  Anesthesia Type:General  Level of Consciousness: awake and oriented  Airway & Oxygen Therapy: Patient Spontanous Breathing  Post-op Assessment: Report given to RN and Post -op Vital signs reviewed and stable  Post vital signs: Reviewed and stable  Last Vitals:  Vitals Value Taken Time  BP 141/75 09/04/23 1028  Temp    Pulse 87 09/04/23 1029  Resp 18 09/04/23 1029  SpO2 96 % 09/04/23 1029  Vitals shown include unfiled device data.  Last Pain:  Vitals:   09/04/23 0720  TempSrc:   PainSc: 0-No pain         Complications: No notable events documented.

## 2023-09-04 NOTE — Anesthesia Procedure Notes (Signed)
Procedure Name: Intubation Date/Time: 09/04/2023 9:28 AM  Performed by: Sandie Ano, CRNAPre-anesthesia Checklist: Patient identified, Emergency Drugs available, Suction available and Patient being monitored Patient Re-evaluated:Patient Re-evaluated prior to induction Oxygen Delivery Method: Circle System Utilized Preoxygenation: Pre-oxygenation with 100% oxygen Induction Type: IV induction Ventilation: Mask ventilation without difficulty Laryngoscope Size: Mac and 3 Grade View: Grade II Tube type: Oral Tube size: 7.0 mm Number of attempts: 1 Airway Equipment and Method: Stylet and Oral airway Placement Confirmation: ETT inserted through vocal cords under direct vision, positive ETCO2 and breath sounds checked- equal and bilateral Secured at: 22 cm Tube secured with: Tape Dental Injury: Teeth and Oropharynx as per pre-operative assessment

## 2023-09-04 NOTE — ED Provider Notes (Signed)
I saw and evaluated the patient, reviewed the resident's note and I agree with the findings and plan.   Patient here due to postop bleeding after having peritoneal dialysis catheter placed.  Seen by vascular surgery and had site was repaired.  Hemoglobin is stable and he will go home      Lorre Nick, MD 09/04/23 2141

## 2023-09-04 NOTE — Anesthesia Postprocedure Evaluation (Signed)
Anesthesia Post Note  Patient: MAJOUR LAMM  Procedure(s) Performed: LAPAROSCOPIC INSERTION CONTINUOUS AMBULATORY PERITONEAL DIALYSIS  (CAPD) CATHETER LAPAROSCOPIC OMENTOPEXY (Abdomen) LAPAROSCOPIC LYSIS OF ADHESIONS (Abdomen)     Patient location during evaluation: PACU Anesthesia Type: General Level of consciousness: awake and alert Pain management: pain level controlled Vital Signs Assessment: post-procedure vital signs reviewed and stable Respiratory status: spontaneous breathing, nonlabored ventilation, respiratory function stable and patient connected to nasal cannula oxygen Cardiovascular status: blood pressure returned to baseline and stable Postop Assessment: no apparent nausea or vomiting Anesthetic complications: no   No notable events documented.  Last Vitals:  Vitals:   09/04/23 1045 09/04/23 1100  BP: 126/79 (!) 127/59  Pulse: 85 88  Resp: 16 15  Temp:  36.8 C  SpO2: 98% 96%    Last Pain:  Vitals:   09/04/23 1100  TempSrc:   PainSc: 3                  [place]

## 2023-09-05 ENCOUNTER — Encounter (HOSPITAL_COMMUNITY): Payer: Self-pay | Admitting: Vascular Surgery

## 2023-09-05 DIAGNOSIS — R58 Hemorrhage, not elsewhere classified: Secondary | ICD-10-CM | POA: Diagnosis not present

## 2023-09-05 DIAGNOSIS — N186 End stage renal disease: Secondary | ICD-10-CM | POA: Diagnosis not present

## 2023-09-05 DIAGNOSIS — N2581 Secondary hyperparathyroidism of renal origin: Secondary | ICD-10-CM | POA: Diagnosis not present

## 2023-09-05 DIAGNOSIS — Z992 Dependence on renal dialysis: Secondary | ICD-10-CM | POA: Diagnosis not present

## 2023-09-05 DIAGNOSIS — E876 Hypokalemia: Secondary | ICD-10-CM | POA: Diagnosis not present

## 2023-09-05 DIAGNOSIS — D689 Coagulation defect, unspecified: Secondary | ICD-10-CM | POA: Diagnosis not present

## 2023-09-08 DIAGNOSIS — N186 End stage renal disease: Secondary | ICD-10-CM | POA: Diagnosis not present

## 2023-09-08 DIAGNOSIS — D689 Coagulation defect, unspecified: Secondary | ICD-10-CM | POA: Diagnosis not present

## 2023-09-08 DIAGNOSIS — Z992 Dependence on renal dialysis: Secondary | ICD-10-CM | POA: Diagnosis not present

## 2023-09-08 DIAGNOSIS — N2581 Secondary hyperparathyroidism of renal origin: Secondary | ICD-10-CM | POA: Diagnosis not present

## 2023-09-08 DIAGNOSIS — E876 Hypokalemia: Secondary | ICD-10-CM | POA: Diagnosis not present

## 2023-09-10 DIAGNOSIS — N186 End stage renal disease: Secondary | ICD-10-CM | POA: Diagnosis not present

## 2023-09-10 DIAGNOSIS — D689 Coagulation defect, unspecified: Secondary | ICD-10-CM | POA: Diagnosis not present

## 2023-09-10 DIAGNOSIS — N2581 Secondary hyperparathyroidism of renal origin: Secondary | ICD-10-CM | POA: Diagnosis not present

## 2023-09-10 DIAGNOSIS — E876 Hypokalemia: Secondary | ICD-10-CM | POA: Diagnosis not present

## 2023-09-10 DIAGNOSIS — Z992 Dependence on renal dialysis: Secondary | ICD-10-CM | POA: Diagnosis not present

## 2023-09-12 DIAGNOSIS — Z992 Dependence on renal dialysis: Secondary | ICD-10-CM | POA: Diagnosis not present

## 2023-09-12 DIAGNOSIS — E876 Hypokalemia: Secondary | ICD-10-CM | POA: Diagnosis not present

## 2023-09-12 DIAGNOSIS — N2581 Secondary hyperparathyroidism of renal origin: Secondary | ICD-10-CM | POA: Diagnosis not present

## 2023-09-12 DIAGNOSIS — N186 End stage renal disease: Secondary | ICD-10-CM | POA: Diagnosis not present

## 2023-09-12 DIAGNOSIS — D689 Coagulation defect, unspecified: Secondary | ICD-10-CM | POA: Diagnosis not present

## 2023-09-15 DIAGNOSIS — Z992 Dependence on renal dialysis: Secondary | ICD-10-CM | POA: Diagnosis not present

## 2023-09-15 DIAGNOSIS — D689 Coagulation defect, unspecified: Secondary | ICD-10-CM | POA: Diagnosis not present

## 2023-09-15 DIAGNOSIS — N2581 Secondary hyperparathyroidism of renal origin: Secondary | ICD-10-CM | POA: Diagnosis not present

## 2023-09-15 DIAGNOSIS — N186 End stage renal disease: Secondary | ICD-10-CM | POA: Diagnosis not present

## 2023-09-15 DIAGNOSIS — E876 Hypokalemia: Secondary | ICD-10-CM | POA: Diagnosis not present

## 2023-09-17 DIAGNOSIS — E876 Hypokalemia: Secondary | ICD-10-CM | POA: Diagnosis not present

## 2023-09-17 DIAGNOSIS — Z992 Dependence on renal dialysis: Secondary | ICD-10-CM | POA: Diagnosis not present

## 2023-09-17 DIAGNOSIS — N2581 Secondary hyperparathyroidism of renal origin: Secondary | ICD-10-CM | POA: Diagnosis not present

## 2023-09-17 DIAGNOSIS — D689 Coagulation defect, unspecified: Secondary | ICD-10-CM | POA: Diagnosis not present

## 2023-09-17 DIAGNOSIS — N186 End stage renal disease: Secondary | ICD-10-CM | POA: Diagnosis not present

## 2023-09-18 DIAGNOSIS — Z4889 Encounter for other specified surgical aftercare: Secondary | ICD-10-CM | POA: Diagnosis not present

## 2023-09-19 DIAGNOSIS — E876 Hypokalemia: Secondary | ICD-10-CM | POA: Diagnosis not present

## 2023-09-19 DIAGNOSIS — D689 Coagulation defect, unspecified: Secondary | ICD-10-CM | POA: Diagnosis not present

## 2023-09-19 DIAGNOSIS — Z992 Dependence on renal dialysis: Secondary | ICD-10-CM | POA: Diagnosis not present

## 2023-09-19 DIAGNOSIS — N2581 Secondary hyperparathyroidism of renal origin: Secondary | ICD-10-CM | POA: Diagnosis not present

## 2023-09-19 DIAGNOSIS — N186 End stage renal disease: Secondary | ICD-10-CM | POA: Diagnosis not present

## 2023-09-21 DIAGNOSIS — J9610 Chronic respiratory failure, unspecified whether with hypoxia or hypercapnia: Secondary | ICD-10-CM | POA: Diagnosis not present

## 2023-09-21 DIAGNOSIS — G4733 Obstructive sleep apnea (adult) (pediatric): Secondary | ICD-10-CM | POA: Diagnosis not present

## 2023-09-22 DIAGNOSIS — N2581 Secondary hyperparathyroidism of renal origin: Secondary | ICD-10-CM | POA: Diagnosis not present

## 2023-09-22 DIAGNOSIS — E876 Hypokalemia: Secondary | ICD-10-CM | POA: Diagnosis not present

## 2023-09-22 DIAGNOSIS — N186 End stage renal disease: Secondary | ICD-10-CM | POA: Diagnosis not present

## 2023-09-22 DIAGNOSIS — Z992 Dependence on renal dialysis: Secondary | ICD-10-CM | POA: Diagnosis not present

## 2023-09-22 DIAGNOSIS — D689 Coagulation defect, unspecified: Secondary | ICD-10-CM | POA: Diagnosis not present

## 2023-09-22 DIAGNOSIS — E1129 Type 2 diabetes mellitus with other diabetic kidney complication: Secondary | ICD-10-CM | POA: Diagnosis not present

## 2023-09-23 DIAGNOSIS — Z992 Dependence on renal dialysis: Secondary | ICD-10-CM | POA: Diagnosis not present

## 2023-09-23 DIAGNOSIS — Z4932 Encounter for adequacy testing for peritoneal dialysis: Secondary | ICD-10-CM | POA: Diagnosis not present

## 2023-09-23 DIAGNOSIS — Z79899 Other long term (current) drug therapy: Secondary | ICD-10-CM | POA: Diagnosis not present

## 2023-09-23 DIAGNOSIS — D509 Iron deficiency anemia, unspecified: Secondary | ICD-10-CM | POA: Diagnosis not present

## 2023-09-23 DIAGNOSIS — N2581 Secondary hyperparathyroidism of renal origin: Secondary | ICD-10-CM | POA: Diagnosis not present

## 2023-09-23 DIAGNOSIS — N186 End stage renal disease: Secondary | ICD-10-CM | POA: Diagnosis not present

## 2023-09-23 DIAGNOSIS — N2589 Other disorders resulting from impaired renal tubular function: Secondary | ICD-10-CM | POA: Diagnosis not present

## 2023-09-23 DIAGNOSIS — K7689 Other specified diseases of liver: Secondary | ICD-10-CM | POA: Diagnosis not present

## 2023-09-23 DIAGNOSIS — R17 Unspecified jaundice: Secondary | ICD-10-CM | POA: Diagnosis not present

## 2023-09-23 DIAGNOSIS — E878 Other disorders of electrolyte and fluid balance, not elsewhere classified: Secondary | ICD-10-CM | POA: Diagnosis not present

## 2023-09-23 DIAGNOSIS — D631 Anemia in chronic kidney disease: Secondary | ICD-10-CM | POA: Diagnosis not present

## 2023-09-23 DIAGNOSIS — E440 Moderate protein-calorie malnutrition: Secondary | ICD-10-CM | POA: Diagnosis not present

## 2023-09-23 DIAGNOSIS — E1122 Type 2 diabetes mellitus with diabetic chronic kidney disease: Secondary | ICD-10-CM | POA: Diagnosis not present

## 2023-09-24 DIAGNOSIS — D509 Iron deficiency anemia, unspecified: Secondary | ICD-10-CM | POA: Diagnosis not present

## 2023-09-24 DIAGNOSIS — E440 Moderate protein-calorie malnutrition: Secondary | ICD-10-CM | POA: Diagnosis not present

## 2023-09-24 DIAGNOSIS — Z992 Dependence on renal dialysis: Secondary | ICD-10-CM | POA: Diagnosis not present

## 2023-09-24 DIAGNOSIS — E878 Other disorders of electrolyte and fluid balance, not elsewhere classified: Secondary | ICD-10-CM | POA: Diagnosis not present

## 2023-09-24 DIAGNOSIS — Z79899 Other long term (current) drug therapy: Secondary | ICD-10-CM | POA: Diagnosis not present

## 2023-09-24 DIAGNOSIS — K7689 Other specified diseases of liver: Secondary | ICD-10-CM | POA: Diagnosis not present

## 2023-09-24 DIAGNOSIS — Z4932 Encounter for adequacy testing for peritoneal dialysis: Secondary | ICD-10-CM | POA: Diagnosis not present

## 2023-09-24 DIAGNOSIS — N2589 Other disorders resulting from impaired renal tubular function: Secondary | ICD-10-CM | POA: Diagnosis not present

## 2023-09-24 DIAGNOSIS — N186 End stage renal disease: Secondary | ICD-10-CM | POA: Diagnosis not present

## 2023-09-24 DIAGNOSIS — N2581 Secondary hyperparathyroidism of renal origin: Secondary | ICD-10-CM | POA: Diagnosis not present

## 2023-09-24 DIAGNOSIS — R17 Unspecified jaundice: Secondary | ICD-10-CM | POA: Diagnosis not present

## 2023-09-24 DIAGNOSIS — D631 Anemia in chronic kidney disease: Secondary | ICD-10-CM | POA: Diagnosis not present

## 2023-09-24 DIAGNOSIS — E1122 Type 2 diabetes mellitus with diabetic chronic kidney disease: Secondary | ICD-10-CM | POA: Diagnosis not present

## 2023-09-25 DIAGNOSIS — N186 End stage renal disease: Secondary | ICD-10-CM | POA: Diagnosis not present

## 2023-09-25 DIAGNOSIS — N2581 Secondary hyperparathyroidism of renal origin: Secondary | ICD-10-CM | POA: Diagnosis not present

## 2023-09-25 DIAGNOSIS — Z992 Dependence on renal dialysis: Secondary | ICD-10-CM | POA: Diagnosis not present

## 2023-09-26 DIAGNOSIS — N2581 Secondary hyperparathyroidism of renal origin: Secondary | ICD-10-CM | POA: Diagnosis not present

## 2023-09-26 DIAGNOSIS — N186 End stage renal disease: Secondary | ICD-10-CM | POA: Diagnosis not present

## 2023-09-26 DIAGNOSIS — Z992 Dependence on renal dialysis: Secondary | ICD-10-CM | POA: Diagnosis not present

## 2023-09-29 DIAGNOSIS — Z992 Dependence on renal dialysis: Secondary | ICD-10-CM | POA: Diagnosis not present

## 2023-09-29 DIAGNOSIS — N2581 Secondary hyperparathyroidism of renal origin: Secondary | ICD-10-CM | POA: Diagnosis not present

## 2023-09-29 DIAGNOSIS — N186 End stage renal disease: Secondary | ICD-10-CM | POA: Diagnosis not present

## 2023-09-30 DIAGNOSIS — N2581 Secondary hyperparathyroidism of renal origin: Secondary | ICD-10-CM | POA: Diagnosis not present

## 2023-09-30 DIAGNOSIS — I4891 Unspecified atrial fibrillation: Secondary | ICD-10-CM | POA: Diagnosis not present

## 2023-09-30 DIAGNOSIS — A419 Sepsis, unspecified organism: Secondary | ICD-10-CM | POA: Diagnosis not present

## 2023-09-30 DIAGNOSIS — N186 End stage renal disease: Secondary | ICD-10-CM | POA: Diagnosis not present

## 2023-09-30 DIAGNOSIS — E440 Moderate protein-calorie malnutrition: Secondary | ICD-10-CM | POA: Diagnosis not present

## 2023-09-30 DIAGNOSIS — Z992 Dependence on renal dialysis: Secondary | ICD-10-CM | POA: Diagnosis not present

## 2023-09-30 DIAGNOSIS — R6521 Severe sepsis with septic shock: Secondary | ICD-10-CM | POA: Diagnosis not present

## 2023-10-02 DIAGNOSIS — N2581 Secondary hyperparathyroidism of renal origin: Secondary | ICD-10-CM | POA: Diagnosis not present

## 2023-10-02 DIAGNOSIS — N186 End stage renal disease: Secondary | ICD-10-CM | POA: Diagnosis not present

## 2023-10-02 DIAGNOSIS — Z992 Dependence on renal dialysis: Secondary | ICD-10-CM | POA: Diagnosis not present

## 2023-10-03 DIAGNOSIS — N2581 Secondary hyperparathyroidism of renal origin: Secondary | ICD-10-CM | POA: Diagnosis not present

## 2023-10-03 DIAGNOSIS — N186 End stage renal disease: Secondary | ICD-10-CM | POA: Diagnosis not present

## 2023-10-03 DIAGNOSIS — Z992 Dependence on renal dialysis: Secondary | ICD-10-CM | POA: Diagnosis not present

## 2023-10-05 DIAGNOSIS — Z992 Dependence on renal dialysis: Secondary | ICD-10-CM | POA: Diagnosis not present

## 2023-10-05 DIAGNOSIS — N186 End stage renal disease: Secondary | ICD-10-CM | POA: Diagnosis not present

## 2023-10-05 DIAGNOSIS — N2581 Secondary hyperparathyroidism of renal origin: Secondary | ICD-10-CM | POA: Diagnosis not present

## 2023-10-06 DIAGNOSIS — Z992 Dependence on renal dialysis: Secondary | ICD-10-CM | POA: Diagnosis not present

## 2023-10-06 DIAGNOSIS — N2581 Secondary hyperparathyroidism of renal origin: Secondary | ICD-10-CM | POA: Diagnosis not present

## 2023-10-06 DIAGNOSIS — N186 End stage renal disease: Secondary | ICD-10-CM | POA: Diagnosis not present

## 2023-10-07 DIAGNOSIS — N2581 Secondary hyperparathyroidism of renal origin: Secondary | ICD-10-CM | POA: Diagnosis not present

## 2023-10-07 DIAGNOSIS — N186 End stage renal disease: Secondary | ICD-10-CM | POA: Diagnosis not present

## 2023-10-07 DIAGNOSIS — Z992 Dependence on renal dialysis: Secondary | ICD-10-CM | POA: Diagnosis not present

## 2023-10-08 DIAGNOSIS — Z992 Dependence on renal dialysis: Secondary | ICD-10-CM | POA: Diagnosis not present

## 2023-10-08 DIAGNOSIS — N2581 Secondary hyperparathyroidism of renal origin: Secondary | ICD-10-CM | POA: Diagnosis not present

## 2023-10-08 DIAGNOSIS — N186 End stage renal disease: Secondary | ICD-10-CM | POA: Diagnosis not present

## 2023-10-09 DIAGNOSIS — N2581 Secondary hyperparathyroidism of renal origin: Secondary | ICD-10-CM | POA: Diagnosis not present

## 2023-10-09 DIAGNOSIS — Z992 Dependence on renal dialysis: Secondary | ICD-10-CM | POA: Diagnosis not present

## 2023-10-09 DIAGNOSIS — N186 End stage renal disease: Secondary | ICD-10-CM | POA: Diagnosis not present

## 2023-10-10 DIAGNOSIS — M109 Gout, unspecified: Secondary | ICD-10-CM | POA: Diagnosis not present

## 2023-10-10 DIAGNOSIS — N2581 Secondary hyperparathyroidism of renal origin: Secondary | ICD-10-CM | POA: Diagnosis not present

## 2023-10-10 DIAGNOSIS — E1122 Type 2 diabetes mellitus with diabetic chronic kidney disease: Secondary | ICD-10-CM | POA: Diagnosis not present

## 2023-10-10 DIAGNOSIS — E7800 Pure hypercholesterolemia, unspecified: Secondary | ICD-10-CM | POA: Diagnosis not present

## 2023-10-10 DIAGNOSIS — E538 Deficiency of other specified B group vitamins: Secondary | ICD-10-CM | POA: Diagnosis not present

## 2023-10-10 DIAGNOSIS — N186 End stage renal disease: Secondary | ICD-10-CM | POA: Diagnosis not present

## 2023-10-10 DIAGNOSIS — Z992 Dependence on renal dialysis: Secondary | ICD-10-CM | POA: Diagnosis not present

## 2023-10-11 DIAGNOSIS — N2581 Secondary hyperparathyroidism of renal origin: Secondary | ICD-10-CM | POA: Diagnosis not present

## 2023-10-11 DIAGNOSIS — Z992 Dependence on renal dialysis: Secondary | ICD-10-CM | POA: Diagnosis not present

## 2023-10-11 DIAGNOSIS — N186 End stage renal disease: Secondary | ICD-10-CM | POA: Diagnosis not present

## 2023-10-12 DIAGNOSIS — N186 End stage renal disease: Secondary | ICD-10-CM | POA: Diagnosis not present

## 2023-10-12 DIAGNOSIS — Z992 Dependence on renal dialysis: Secondary | ICD-10-CM | POA: Diagnosis not present

## 2023-10-12 DIAGNOSIS — N2581 Secondary hyperparathyroidism of renal origin: Secondary | ICD-10-CM | POA: Diagnosis not present

## 2023-10-13 DIAGNOSIS — N2581 Secondary hyperparathyroidism of renal origin: Secondary | ICD-10-CM | POA: Diagnosis not present

## 2023-10-13 DIAGNOSIS — Z992 Dependence on renal dialysis: Secondary | ICD-10-CM | POA: Diagnosis not present

## 2023-10-13 DIAGNOSIS — N186 End stage renal disease: Secondary | ICD-10-CM | POA: Diagnosis not present

## 2023-10-14 DIAGNOSIS — J309 Allergic rhinitis, unspecified: Secondary | ICD-10-CM | POA: Diagnosis not present

## 2023-10-14 DIAGNOSIS — C44722 Squamous cell carcinoma of skin of right lower limb, including hip: Secondary | ICD-10-CM | POA: Diagnosis not present

## 2023-10-14 DIAGNOSIS — D225 Melanocytic nevi of trunk: Secondary | ICD-10-CM | POA: Diagnosis not present

## 2023-10-14 DIAGNOSIS — Z992 Dependence on renal dialysis: Secondary | ICD-10-CM | POA: Diagnosis not present

## 2023-10-14 DIAGNOSIS — Z1159 Encounter for screening for other viral diseases: Secondary | ICD-10-CM | POA: Diagnosis not present

## 2023-10-14 DIAGNOSIS — Z862 Personal history of diseases of the blood and blood-forming organs and certain disorders involving the immune mechanism: Secondary | ICD-10-CM | POA: Diagnosis not present

## 2023-10-14 DIAGNOSIS — Z85828 Personal history of other malignant neoplasm of skin: Secondary | ICD-10-CM | POA: Diagnosis not present

## 2023-10-14 DIAGNOSIS — N2581 Secondary hyperparathyroidism of renal origin: Secondary | ICD-10-CM | POA: Diagnosis not present

## 2023-10-14 DIAGNOSIS — D84821 Immunodeficiency due to drugs: Secondary | ICD-10-CM | POA: Diagnosis not present

## 2023-10-14 DIAGNOSIS — L821 Other seborrheic keratosis: Secondary | ICD-10-CM | POA: Diagnosis not present

## 2023-10-14 DIAGNOSIS — C4441 Basal cell carcinoma of skin of scalp and neck: Secondary | ICD-10-CM | POA: Diagnosis not present

## 2023-10-14 DIAGNOSIS — C44629 Squamous cell carcinoma of skin of left upper limb, including shoulder: Secondary | ICD-10-CM | POA: Diagnosis not present

## 2023-10-14 DIAGNOSIS — L738 Other specified follicular disorders: Secondary | ICD-10-CM | POA: Diagnosis not present

## 2023-10-14 DIAGNOSIS — E1165 Type 2 diabetes mellitus with hyperglycemia: Secondary | ICD-10-CM | POA: Diagnosis not present

## 2023-10-14 DIAGNOSIS — Z0000 Encounter for general adult medical examination without abnormal findings: Secondary | ICD-10-CM | POA: Diagnosis not present

## 2023-10-14 DIAGNOSIS — D6869 Other thrombophilia: Secondary | ICD-10-CM | POA: Diagnosis not present

## 2023-10-14 DIAGNOSIS — Z940 Kidney transplant status: Secondary | ICD-10-CM | POA: Diagnosis not present

## 2023-10-14 DIAGNOSIS — M109 Gout, unspecified: Secondary | ICD-10-CM | POA: Diagnosis not present

## 2023-10-14 DIAGNOSIS — I4891 Unspecified atrial fibrillation: Secondary | ICD-10-CM | POA: Diagnosis not present

## 2023-10-14 DIAGNOSIS — L570 Actinic keratosis: Secondary | ICD-10-CM | POA: Diagnosis not present

## 2023-10-14 DIAGNOSIS — L814 Other melanin hyperpigmentation: Secondary | ICD-10-CM | POA: Diagnosis not present

## 2023-10-14 DIAGNOSIS — N186 End stage renal disease: Secondary | ICD-10-CM | POA: Diagnosis not present

## 2023-10-14 DIAGNOSIS — E782 Mixed hyperlipidemia: Secondary | ICD-10-CM | POA: Diagnosis not present

## 2023-10-15 DIAGNOSIS — N2581 Secondary hyperparathyroidism of renal origin: Secondary | ICD-10-CM | POA: Diagnosis not present

## 2023-10-15 DIAGNOSIS — Z992 Dependence on renal dialysis: Secondary | ICD-10-CM | POA: Diagnosis not present

## 2023-10-15 DIAGNOSIS — N186 End stage renal disease: Secondary | ICD-10-CM | POA: Diagnosis not present

## 2023-10-16 DIAGNOSIS — Z992 Dependence on renal dialysis: Secondary | ICD-10-CM | POA: Diagnosis not present

## 2023-10-16 DIAGNOSIS — N2581 Secondary hyperparathyroidism of renal origin: Secondary | ICD-10-CM | POA: Diagnosis not present

## 2023-10-16 DIAGNOSIS — N186 End stage renal disease: Secondary | ICD-10-CM | POA: Diagnosis not present

## 2023-10-17 DIAGNOSIS — N2581 Secondary hyperparathyroidism of renal origin: Secondary | ICD-10-CM | POA: Diagnosis not present

## 2023-10-17 DIAGNOSIS — N186 End stage renal disease: Secondary | ICD-10-CM | POA: Diagnosis not present

## 2023-10-17 DIAGNOSIS — Z992 Dependence on renal dialysis: Secondary | ICD-10-CM | POA: Diagnosis not present

## 2023-10-18 DIAGNOSIS — N186 End stage renal disease: Secondary | ICD-10-CM | POA: Diagnosis not present

## 2023-10-18 DIAGNOSIS — Z992 Dependence on renal dialysis: Secondary | ICD-10-CM | POA: Diagnosis not present

## 2023-10-18 DIAGNOSIS — N2581 Secondary hyperparathyroidism of renal origin: Secondary | ICD-10-CM | POA: Diagnosis not present

## 2023-10-19 DIAGNOSIS — N186 End stage renal disease: Secondary | ICD-10-CM | POA: Diagnosis not present

## 2023-10-19 DIAGNOSIS — N2581 Secondary hyperparathyroidism of renal origin: Secondary | ICD-10-CM | POA: Diagnosis not present

## 2023-10-19 DIAGNOSIS — Z992 Dependence on renal dialysis: Secondary | ICD-10-CM | POA: Diagnosis not present

## 2023-10-20 DIAGNOSIS — N2581 Secondary hyperparathyroidism of renal origin: Secondary | ICD-10-CM | POA: Diagnosis not present

## 2023-10-20 DIAGNOSIS — N186 End stage renal disease: Secondary | ICD-10-CM | POA: Diagnosis not present

## 2023-10-20 DIAGNOSIS — Z992 Dependence on renal dialysis: Secondary | ICD-10-CM | POA: Diagnosis not present

## 2023-10-21 DIAGNOSIS — N186 End stage renal disease: Secondary | ICD-10-CM | POA: Diagnosis not present

## 2023-10-21 DIAGNOSIS — N2581 Secondary hyperparathyroidism of renal origin: Secondary | ICD-10-CM | POA: Diagnosis not present

## 2023-10-21 DIAGNOSIS — Z992 Dependence on renal dialysis: Secondary | ICD-10-CM | POA: Diagnosis not present

## 2023-10-22 DIAGNOSIS — N2581 Secondary hyperparathyroidism of renal origin: Secondary | ICD-10-CM | POA: Diagnosis not present

## 2023-10-22 DIAGNOSIS — Z992 Dependence on renal dialysis: Secondary | ICD-10-CM | POA: Diagnosis not present

## 2023-10-22 DIAGNOSIS — N186 End stage renal disease: Secondary | ICD-10-CM | POA: Diagnosis not present

## 2023-10-23 DIAGNOSIS — Z992 Dependence on renal dialysis: Secondary | ICD-10-CM | POA: Diagnosis not present

## 2023-10-23 DIAGNOSIS — N186 End stage renal disease: Secondary | ICD-10-CM | POA: Diagnosis not present

## 2023-10-23 DIAGNOSIS — N2581 Secondary hyperparathyroidism of renal origin: Secondary | ICD-10-CM | POA: Diagnosis not present

## 2023-10-24 DIAGNOSIS — D509 Iron deficiency anemia, unspecified: Secondary | ICD-10-CM | POA: Diagnosis not present

## 2023-10-24 DIAGNOSIS — E440 Moderate protein-calorie malnutrition: Secondary | ICD-10-CM | POA: Diagnosis not present

## 2023-10-24 DIAGNOSIS — D630 Anemia in neoplastic disease: Secondary | ICD-10-CM | POA: Diagnosis not present

## 2023-10-24 DIAGNOSIS — N186 End stage renal disease: Secondary | ICD-10-CM | POA: Diagnosis not present

## 2023-10-24 DIAGNOSIS — Z4932 Encounter for adequacy testing for peritoneal dialysis: Secondary | ICD-10-CM | POA: Diagnosis not present

## 2023-10-24 DIAGNOSIS — E878 Other disorders of electrolyte and fluid balance, not elsewhere classified: Secondary | ICD-10-CM | POA: Diagnosis not present

## 2023-10-24 DIAGNOSIS — Z79899 Other long term (current) drug therapy: Secondary | ICD-10-CM | POA: Diagnosis not present

## 2023-10-24 DIAGNOSIS — K7689 Other specified diseases of liver: Secondary | ICD-10-CM | POA: Diagnosis not present

## 2023-10-24 DIAGNOSIS — N2581 Secondary hyperparathyroidism of renal origin: Secondary | ICD-10-CM | POA: Diagnosis not present

## 2023-10-24 DIAGNOSIS — Z992 Dependence on renal dialysis: Secondary | ICD-10-CM | POA: Diagnosis not present

## 2023-10-25 DIAGNOSIS — N186 End stage renal disease: Secondary | ICD-10-CM | POA: Diagnosis not present

## 2023-10-25 DIAGNOSIS — E878 Other disorders of electrolyte and fluid balance, not elsewhere classified: Secondary | ICD-10-CM | POA: Diagnosis not present

## 2023-10-25 DIAGNOSIS — Z4932 Encounter for adequacy testing for peritoneal dialysis: Secondary | ICD-10-CM | POA: Diagnosis not present

## 2023-10-25 DIAGNOSIS — N2581 Secondary hyperparathyroidism of renal origin: Secondary | ICD-10-CM | POA: Diagnosis not present

## 2023-10-25 DIAGNOSIS — E440 Moderate protein-calorie malnutrition: Secondary | ICD-10-CM | POA: Diagnosis not present

## 2023-10-25 DIAGNOSIS — Z992 Dependence on renal dialysis: Secondary | ICD-10-CM | POA: Diagnosis not present

## 2023-10-25 DIAGNOSIS — K7689 Other specified diseases of liver: Secondary | ICD-10-CM | POA: Diagnosis not present

## 2023-10-25 DIAGNOSIS — D630 Anemia in neoplastic disease: Secondary | ICD-10-CM | POA: Diagnosis not present

## 2023-10-25 DIAGNOSIS — Z79899 Other long term (current) drug therapy: Secondary | ICD-10-CM | POA: Diagnosis not present

## 2023-10-25 DIAGNOSIS — D509 Iron deficiency anemia, unspecified: Secondary | ICD-10-CM | POA: Diagnosis not present

## 2023-10-26 DIAGNOSIS — Z79899 Other long term (current) drug therapy: Secondary | ICD-10-CM | POA: Diagnosis not present

## 2023-10-26 DIAGNOSIS — D509 Iron deficiency anemia, unspecified: Secondary | ICD-10-CM | POA: Diagnosis not present

## 2023-10-26 DIAGNOSIS — D630 Anemia in neoplastic disease: Secondary | ICD-10-CM | POA: Diagnosis not present

## 2023-10-26 DIAGNOSIS — E878 Other disorders of electrolyte and fluid balance, not elsewhere classified: Secondary | ICD-10-CM | POA: Diagnosis not present

## 2023-10-26 DIAGNOSIS — Z992 Dependence on renal dialysis: Secondary | ICD-10-CM | POA: Diagnosis not present

## 2023-10-26 DIAGNOSIS — K7689 Other specified diseases of liver: Secondary | ICD-10-CM | POA: Diagnosis not present

## 2023-10-26 DIAGNOSIS — N2581 Secondary hyperparathyroidism of renal origin: Secondary | ICD-10-CM | POA: Diagnosis not present

## 2023-10-26 DIAGNOSIS — N186 End stage renal disease: Secondary | ICD-10-CM | POA: Diagnosis not present

## 2023-10-26 DIAGNOSIS — Z4932 Encounter for adequacy testing for peritoneal dialysis: Secondary | ICD-10-CM | POA: Diagnosis not present

## 2023-10-26 DIAGNOSIS — E440 Moderate protein-calorie malnutrition: Secondary | ICD-10-CM | POA: Diagnosis not present

## 2023-10-27 DIAGNOSIS — E878 Other disorders of electrolyte and fluid balance, not elsewhere classified: Secondary | ICD-10-CM | POA: Diagnosis not present

## 2023-10-27 DIAGNOSIS — N2581 Secondary hyperparathyroidism of renal origin: Secondary | ICD-10-CM | POA: Diagnosis not present

## 2023-10-27 DIAGNOSIS — D509 Iron deficiency anemia, unspecified: Secondary | ICD-10-CM | POA: Diagnosis not present

## 2023-10-27 DIAGNOSIS — N186 End stage renal disease: Secondary | ICD-10-CM | POA: Diagnosis not present

## 2023-10-27 DIAGNOSIS — Z4932 Encounter for adequacy testing for peritoneal dialysis: Secondary | ICD-10-CM | POA: Diagnosis not present

## 2023-10-27 DIAGNOSIS — Z992 Dependence on renal dialysis: Secondary | ICD-10-CM | POA: Diagnosis not present

## 2023-10-27 DIAGNOSIS — E440 Moderate protein-calorie malnutrition: Secondary | ICD-10-CM | POA: Diagnosis not present

## 2023-10-27 DIAGNOSIS — K7689 Other specified diseases of liver: Secondary | ICD-10-CM | POA: Diagnosis not present

## 2023-10-27 DIAGNOSIS — D630 Anemia in neoplastic disease: Secondary | ICD-10-CM | POA: Diagnosis not present

## 2023-10-27 DIAGNOSIS — Z79899 Other long term (current) drug therapy: Secondary | ICD-10-CM | POA: Diagnosis not present

## 2023-10-28 DIAGNOSIS — D630 Anemia in neoplastic disease: Secondary | ICD-10-CM | POA: Diagnosis not present

## 2023-10-28 DIAGNOSIS — K7689 Other specified diseases of liver: Secondary | ICD-10-CM | POA: Diagnosis not present

## 2023-10-28 DIAGNOSIS — Z992 Dependence on renal dialysis: Secondary | ICD-10-CM | POA: Diagnosis not present

## 2023-10-28 DIAGNOSIS — N186 End stage renal disease: Secondary | ICD-10-CM | POA: Diagnosis not present

## 2023-10-28 DIAGNOSIS — N2581 Secondary hyperparathyroidism of renal origin: Secondary | ICD-10-CM | POA: Diagnosis not present

## 2023-10-28 DIAGNOSIS — E440 Moderate protein-calorie malnutrition: Secondary | ICD-10-CM | POA: Diagnosis not present

## 2023-10-28 DIAGNOSIS — Z4932 Encounter for adequacy testing for peritoneal dialysis: Secondary | ICD-10-CM | POA: Diagnosis not present

## 2023-10-28 DIAGNOSIS — E878 Other disorders of electrolyte and fluid balance, not elsewhere classified: Secondary | ICD-10-CM | POA: Diagnosis not present

## 2023-10-28 DIAGNOSIS — D509 Iron deficiency anemia, unspecified: Secondary | ICD-10-CM | POA: Diagnosis not present

## 2023-10-28 DIAGNOSIS — Z79899 Other long term (current) drug therapy: Secondary | ICD-10-CM | POA: Diagnosis not present

## 2023-10-29 DIAGNOSIS — E878 Other disorders of electrolyte and fluid balance, not elsewhere classified: Secondary | ICD-10-CM | POA: Diagnosis not present

## 2023-10-29 DIAGNOSIS — Z79899 Other long term (current) drug therapy: Secondary | ICD-10-CM | POA: Diagnosis not present

## 2023-10-29 DIAGNOSIS — Z4932 Encounter for adequacy testing for peritoneal dialysis: Secondary | ICD-10-CM | POA: Diagnosis not present

## 2023-10-29 DIAGNOSIS — D630 Anemia in neoplastic disease: Secondary | ICD-10-CM | POA: Diagnosis not present

## 2023-10-29 DIAGNOSIS — E440 Moderate protein-calorie malnutrition: Secondary | ICD-10-CM | POA: Diagnosis not present

## 2023-10-29 DIAGNOSIS — K7689 Other specified diseases of liver: Secondary | ICD-10-CM | POA: Diagnosis not present

## 2023-10-29 DIAGNOSIS — Z992 Dependence on renal dialysis: Secondary | ICD-10-CM | POA: Diagnosis not present

## 2023-10-29 DIAGNOSIS — D509 Iron deficiency anemia, unspecified: Secondary | ICD-10-CM | POA: Diagnosis not present

## 2023-10-29 DIAGNOSIS — N186 End stage renal disease: Secondary | ICD-10-CM | POA: Diagnosis not present

## 2023-10-29 DIAGNOSIS — N2581 Secondary hyperparathyroidism of renal origin: Secondary | ICD-10-CM | POA: Diagnosis not present

## 2023-10-30 DIAGNOSIS — Z992 Dependence on renal dialysis: Secondary | ICD-10-CM | POA: Diagnosis not present

## 2023-10-30 DIAGNOSIS — E878 Other disorders of electrolyte and fluid balance, not elsewhere classified: Secondary | ICD-10-CM | POA: Diagnosis not present

## 2023-10-30 DIAGNOSIS — D630 Anemia in neoplastic disease: Secondary | ICD-10-CM | POA: Diagnosis not present

## 2023-10-30 DIAGNOSIS — D509 Iron deficiency anemia, unspecified: Secondary | ICD-10-CM | POA: Diagnosis not present

## 2023-10-30 DIAGNOSIS — N2581 Secondary hyperparathyroidism of renal origin: Secondary | ICD-10-CM | POA: Diagnosis not present

## 2023-10-30 DIAGNOSIS — E440 Moderate protein-calorie malnutrition: Secondary | ICD-10-CM | POA: Diagnosis not present

## 2023-10-30 DIAGNOSIS — K7689 Other specified diseases of liver: Secondary | ICD-10-CM | POA: Diagnosis not present

## 2023-10-30 DIAGNOSIS — Z4932 Encounter for adequacy testing for peritoneal dialysis: Secondary | ICD-10-CM | POA: Diagnosis not present

## 2023-10-30 DIAGNOSIS — N186 End stage renal disease: Secondary | ICD-10-CM | POA: Diagnosis not present

## 2023-10-30 DIAGNOSIS — Z79899 Other long term (current) drug therapy: Secondary | ICD-10-CM | POA: Diagnosis not present

## 2023-10-31 DIAGNOSIS — Z992 Dependence on renal dialysis: Secondary | ICD-10-CM | POA: Diagnosis not present

## 2023-10-31 DIAGNOSIS — N2581 Secondary hyperparathyroidism of renal origin: Secondary | ICD-10-CM | POA: Diagnosis not present

## 2023-10-31 DIAGNOSIS — N186 End stage renal disease: Secondary | ICD-10-CM | POA: Diagnosis not present

## 2023-10-31 DIAGNOSIS — R6521 Severe sepsis with septic shock: Secondary | ICD-10-CM | POA: Diagnosis not present

## 2023-10-31 DIAGNOSIS — D630 Anemia in neoplastic disease: Secondary | ICD-10-CM | POA: Diagnosis not present

## 2023-10-31 DIAGNOSIS — E878 Other disorders of electrolyte and fluid balance, not elsewhere classified: Secondary | ICD-10-CM | POA: Diagnosis not present

## 2023-10-31 DIAGNOSIS — Z4932 Encounter for adequacy testing for peritoneal dialysis: Secondary | ICD-10-CM | POA: Diagnosis not present

## 2023-10-31 DIAGNOSIS — Z79899 Other long term (current) drug therapy: Secondary | ICD-10-CM | POA: Diagnosis not present

## 2023-10-31 DIAGNOSIS — E440 Moderate protein-calorie malnutrition: Secondary | ICD-10-CM | POA: Diagnosis not present

## 2023-10-31 DIAGNOSIS — A419 Sepsis, unspecified organism: Secondary | ICD-10-CM | POA: Diagnosis not present

## 2023-10-31 DIAGNOSIS — I4891 Unspecified atrial fibrillation: Secondary | ICD-10-CM | POA: Diagnosis not present

## 2023-10-31 DIAGNOSIS — K7689 Other specified diseases of liver: Secondary | ICD-10-CM | POA: Diagnosis not present

## 2023-10-31 DIAGNOSIS — D509 Iron deficiency anemia, unspecified: Secondary | ICD-10-CM | POA: Diagnosis not present

## 2023-11-01 DIAGNOSIS — D509 Iron deficiency anemia, unspecified: Secondary | ICD-10-CM | POA: Diagnosis not present

## 2023-11-01 DIAGNOSIS — E878 Other disorders of electrolyte and fluid balance, not elsewhere classified: Secondary | ICD-10-CM | POA: Diagnosis not present

## 2023-11-01 DIAGNOSIS — K7689 Other specified diseases of liver: Secondary | ICD-10-CM | POA: Diagnosis not present

## 2023-11-01 DIAGNOSIS — N186 End stage renal disease: Secondary | ICD-10-CM | POA: Diagnosis not present

## 2023-11-01 DIAGNOSIS — Z992 Dependence on renal dialysis: Secondary | ICD-10-CM | POA: Diagnosis not present

## 2023-11-01 DIAGNOSIS — D630 Anemia in neoplastic disease: Secondary | ICD-10-CM | POA: Diagnosis not present

## 2023-11-01 DIAGNOSIS — Z79899 Other long term (current) drug therapy: Secondary | ICD-10-CM | POA: Diagnosis not present

## 2023-11-01 DIAGNOSIS — Z4932 Encounter for adequacy testing for peritoneal dialysis: Secondary | ICD-10-CM | POA: Diagnosis not present

## 2023-11-01 DIAGNOSIS — E440 Moderate protein-calorie malnutrition: Secondary | ICD-10-CM | POA: Diagnosis not present

## 2023-11-01 DIAGNOSIS — N2581 Secondary hyperparathyroidism of renal origin: Secondary | ICD-10-CM | POA: Diagnosis not present

## 2023-11-02 DIAGNOSIS — Z4932 Encounter for adequacy testing for peritoneal dialysis: Secondary | ICD-10-CM | POA: Diagnosis not present

## 2023-11-02 DIAGNOSIS — Z992 Dependence on renal dialysis: Secondary | ICD-10-CM | POA: Diagnosis not present

## 2023-11-02 DIAGNOSIS — D509 Iron deficiency anemia, unspecified: Secondary | ICD-10-CM | POA: Diagnosis not present

## 2023-11-02 DIAGNOSIS — Z79899 Other long term (current) drug therapy: Secondary | ICD-10-CM | POA: Diagnosis not present

## 2023-11-02 DIAGNOSIS — E440 Moderate protein-calorie malnutrition: Secondary | ICD-10-CM | POA: Diagnosis not present

## 2023-11-02 DIAGNOSIS — D630 Anemia in neoplastic disease: Secondary | ICD-10-CM | POA: Diagnosis not present

## 2023-11-02 DIAGNOSIS — E878 Other disorders of electrolyte and fluid balance, not elsewhere classified: Secondary | ICD-10-CM | POA: Diagnosis not present

## 2023-11-02 DIAGNOSIS — K7689 Other specified diseases of liver: Secondary | ICD-10-CM | POA: Diagnosis not present

## 2023-11-02 DIAGNOSIS — N2581 Secondary hyperparathyroidism of renal origin: Secondary | ICD-10-CM | POA: Diagnosis not present

## 2023-11-02 DIAGNOSIS — N186 End stage renal disease: Secondary | ICD-10-CM | POA: Diagnosis not present

## 2023-11-03 DIAGNOSIS — E440 Moderate protein-calorie malnutrition: Secondary | ICD-10-CM | POA: Diagnosis not present

## 2023-11-03 DIAGNOSIS — K7689 Other specified diseases of liver: Secondary | ICD-10-CM | POA: Diagnosis not present

## 2023-11-03 DIAGNOSIS — Z4932 Encounter for adequacy testing for peritoneal dialysis: Secondary | ICD-10-CM | POA: Diagnosis not present

## 2023-11-03 DIAGNOSIS — Z79899 Other long term (current) drug therapy: Secondary | ICD-10-CM | POA: Diagnosis not present

## 2023-11-03 DIAGNOSIS — Z992 Dependence on renal dialysis: Secondary | ICD-10-CM | POA: Diagnosis not present

## 2023-11-03 DIAGNOSIS — N2581 Secondary hyperparathyroidism of renal origin: Secondary | ICD-10-CM | POA: Diagnosis not present

## 2023-11-03 DIAGNOSIS — N186 End stage renal disease: Secondary | ICD-10-CM | POA: Diagnosis not present

## 2023-11-03 DIAGNOSIS — E878 Other disorders of electrolyte and fluid balance, not elsewhere classified: Secondary | ICD-10-CM | POA: Diagnosis not present

## 2023-11-03 DIAGNOSIS — D509 Iron deficiency anemia, unspecified: Secondary | ICD-10-CM | POA: Diagnosis not present

## 2023-11-03 DIAGNOSIS — D630 Anemia in neoplastic disease: Secondary | ICD-10-CM | POA: Diagnosis not present

## 2023-11-04 DIAGNOSIS — Z4932 Encounter for adequacy testing for peritoneal dialysis: Secondary | ICD-10-CM | POA: Diagnosis not present

## 2023-11-04 DIAGNOSIS — E440 Moderate protein-calorie malnutrition: Secondary | ICD-10-CM | POA: Diagnosis not present

## 2023-11-04 DIAGNOSIS — Z79899 Other long term (current) drug therapy: Secondary | ICD-10-CM | POA: Diagnosis not present

## 2023-11-04 DIAGNOSIS — D509 Iron deficiency anemia, unspecified: Secondary | ICD-10-CM | POA: Diagnosis not present

## 2023-11-04 DIAGNOSIS — D630 Anemia in neoplastic disease: Secondary | ICD-10-CM | POA: Diagnosis not present

## 2023-11-04 DIAGNOSIS — Z992 Dependence on renal dialysis: Secondary | ICD-10-CM | POA: Diagnosis not present

## 2023-11-04 DIAGNOSIS — N2581 Secondary hyperparathyroidism of renal origin: Secondary | ICD-10-CM | POA: Diagnosis not present

## 2023-11-04 DIAGNOSIS — K7689 Other specified diseases of liver: Secondary | ICD-10-CM | POA: Diagnosis not present

## 2023-11-04 DIAGNOSIS — N186 End stage renal disease: Secondary | ICD-10-CM | POA: Diagnosis not present

## 2023-11-04 DIAGNOSIS — E878 Other disorders of electrolyte and fluid balance, not elsewhere classified: Secondary | ICD-10-CM | POA: Diagnosis not present

## 2023-11-05 DIAGNOSIS — Z79899 Other long term (current) drug therapy: Secondary | ICD-10-CM | POA: Diagnosis not present

## 2023-11-05 DIAGNOSIS — D630 Anemia in neoplastic disease: Secondary | ICD-10-CM | POA: Diagnosis not present

## 2023-11-05 DIAGNOSIS — K7689 Other specified diseases of liver: Secondary | ICD-10-CM | POA: Diagnosis not present

## 2023-11-05 DIAGNOSIS — Z4932 Encounter for adequacy testing for peritoneal dialysis: Secondary | ICD-10-CM | POA: Diagnosis not present

## 2023-11-05 DIAGNOSIS — E440 Moderate protein-calorie malnutrition: Secondary | ICD-10-CM | POA: Diagnosis not present

## 2023-11-05 DIAGNOSIS — Z992 Dependence on renal dialysis: Secondary | ICD-10-CM | POA: Diagnosis not present

## 2023-11-05 DIAGNOSIS — N186 End stage renal disease: Secondary | ICD-10-CM | POA: Diagnosis not present

## 2023-11-05 DIAGNOSIS — D509 Iron deficiency anemia, unspecified: Secondary | ICD-10-CM | POA: Diagnosis not present

## 2023-11-05 DIAGNOSIS — E878 Other disorders of electrolyte and fluid balance, not elsewhere classified: Secondary | ICD-10-CM | POA: Diagnosis not present

## 2023-11-05 DIAGNOSIS — N2581 Secondary hyperparathyroidism of renal origin: Secondary | ICD-10-CM | POA: Diagnosis not present

## 2023-11-06 DIAGNOSIS — Z992 Dependence on renal dialysis: Secondary | ICD-10-CM | POA: Diagnosis not present

## 2023-11-06 DIAGNOSIS — E440 Moderate protein-calorie malnutrition: Secondary | ICD-10-CM | POA: Diagnosis not present

## 2023-11-06 DIAGNOSIS — N186 End stage renal disease: Secondary | ICD-10-CM | POA: Diagnosis not present

## 2023-11-06 DIAGNOSIS — E878 Other disorders of electrolyte and fluid balance, not elsewhere classified: Secondary | ICD-10-CM | POA: Diagnosis not present

## 2023-11-06 DIAGNOSIS — D509 Iron deficiency anemia, unspecified: Secondary | ICD-10-CM | POA: Diagnosis not present

## 2023-11-06 DIAGNOSIS — Z79899 Other long term (current) drug therapy: Secondary | ICD-10-CM | POA: Diagnosis not present

## 2023-11-06 DIAGNOSIS — N2581 Secondary hyperparathyroidism of renal origin: Secondary | ICD-10-CM | POA: Diagnosis not present

## 2023-11-06 DIAGNOSIS — Z4932 Encounter for adequacy testing for peritoneal dialysis: Secondary | ICD-10-CM | POA: Diagnosis not present

## 2023-11-06 DIAGNOSIS — K7689 Other specified diseases of liver: Secondary | ICD-10-CM | POA: Diagnosis not present

## 2023-11-06 DIAGNOSIS — D630 Anemia in neoplastic disease: Secondary | ICD-10-CM | POA: Diagnosis not present

## 2023-11-07 DIAGNOSIS — D509 Iron deficiency anemia, unspecified: Secondary | ICD-10-CM | POA: Diagnosis not present

## 2023-11-07 DIAGNOSIS — E440 Moderate protein-calorie malnutrition: Secondary | ICD-10-CM | POA: Diagnosis not present

## 2023-11-07 DIAGNOSIS — Z4932 Encounter for adequacy testing for peritoneal dialysis: Secondary | ICD-10-CM | POA: Diagnosis not present

## 2023-11-07 DIAGNOSIS — K7689 Other specified diseases of liver: Secondary | ICD-10-CM | POA: Diagnosis not present

## 2023-11-07 DIAGNOSIS — D630 Anemia in neoplastic disease: Secondary | ICD-10-CM | POA: Diagnosis not present

## 2023-11-07 DIAGNOSIS — N186 End stage renal disease: Secondary | ICD-10-CM | POA: Diagnosis not present

## 2023-11-07 DIAGNOSIS — E878 Other disorders of electrolyte and fluid balance, not elsewhere classified: Secondary | ICD-10-CM | POA: Diagnosis not present

## 2023-11-07 DIAGNOSIS — Z992 Dependence on renal dialysis: Secondary | ICD-10-CM | POA: Diagnosis not present

## 2023-11-07 DIAGNOSIS — Z79899 Other long term (current) drug therapy: Secondary | ICD-10-CM | POA: Diagnosis not present

## 2023-11-07 DIAGNOSIS — N2581 Secondary hyperparathyroidism of renal origin: Secondary | ICD-10-CM | POA: Diagnosis not present

## 2023-11-08 DIAGNOSIS — Z79899 Other long term (current) drug therapy: Secondary | ICD-10-CM | POA: Diagnosis not present

## 2023-11-08 DIAGNOSIS — N2581 Secondary hyperparathyroidism of renal origin: Secondary | ICD-10-CM | POA: Diagnosis not present

## 2023-11-08 DIAGNOSIS — Z4932 Encounter for adequacy testing for peritoneal dialysis: Secondary | ICD-10-CM | POA: Diagnosis not present

## 2023-11-08 DIAGNOSIS — Z992 Dependence on renal dialysis: Secondary | ICD-10-CM | POA: Diagnosis not present

## 2023-11-08 DIAGNOSIS — D509 Iron deficiency anemia, unspecified: Secondary | ICD-10-CM | POA: Diagnosis not present

## 2023-11-08 DIAGNOSIS — K7689 Other specified diseases of liver: Secondary | ICD-10-CM | POA: Diagnosis not present

## 2023-11-08 DIAGNOSIS — D630 Anemia in neoplastic disease: Secondary | ICD-10-CM | POA: Diagnosis not present

## 2023-11-08 DIAGNOSIS — E878 Other disorders of electrolyte and fluid balance, not elsewhere classified: Secondary | ICD-10-CM | POA: Diagnosis not present

## 2023-11-08 DIAGNOSIS — N186 End stage renal disease: Secondary | ICD-10-CM | POA: Diagnosis not present

## 2023-11-08 DIAGNOSIS — E440 Moderate protein-calorie malnutrition: Secondary | ICD-10-CM | POA: Diagnosis not present

## 2023-11-09 DIAGNOSIS — Z4932 Encounter for adequacy testing for peritoneal dialysis: Secondary | ICD-10-CM | POA: Diagnosis not present

## 2023-11-09 DIAGNOSIS — E440 Moderate protein-calorie malnutrition: Secondary | ICD-10-CM | POA: Diagnosis not present

## 2023-11-09 DIAGNOSIS — D509 Iron deficiency anemia, unspecified: Secondary | ICD-10-CM | POA: Diagnosis not present

## 2023-11-09 DIAGNOSIS — Z79899 Other long term (current) drug therapy: Secondary | ICD-10-CM | POA: Diagnosis not present

## 2023-11-09 DIAGNOSIS — N2581 Secondary hyperparathyroidism of renal origin: Secondary | ICD-10-CM | POA: Diagnosis not present

## 2023-11-09 DIAGNOSIS — D630 Anemia in neoplastic disease: Secondary | ICD-10-CM | POA: Diagnosis not present

## 2023-11-09 DIAGNOSIS — N186 End stage renal disease: Secondary | ICD-10-CM | POA: Diagnosis not present

## 2023-11-09 DIAGNOSIS — K7689 Other specified diseases of liver: Secondary | ICD-10-CM | POA: Diagnosis not present

## 2023-11-09 DIAGNOSIS — Z992 Dependence on renal dialysis: Secondary | ICD-10-CM | POA: Diagnosis not present

## 2023-11-09 DIAGNOSIS — E878 Other disorders of electrolyte and fluid balance, not elsewhere classified: Secondary | ICD-10-CM | POA: Diagnosis not present

## 2023-11-10 DIAGNOSIS — N186 End stage renal disease: Secondary | ICD-10-CM | POA: Diagnosis not present

## 2023-11-10 DIAGNOSIS — Z4932 Encounter for adequacy testing for peritoneal dialysis: Secondary | ICD-10-CM | POA: Diagnosis not present

## 2023-11-10 DIAGNOSIS — N2581 Secondary hyperparathyroidism of renal origin: Secondary | ICD-10-CM | POA: Diagnosis not present

## 2023-11-10 DIAGNOSIS — Z992 Dependence on renal dialysis: Secondary | ICD-10-CM | POA: Diagnosis not present

## 2023-11-10 DIAGNOSIS — D509 Iron deficiency anemia, unspecified: Secondary | ICD-10-CM | POA: Diagnosis not present

## 2023-11-10 DIAGNOSIS — Z79899 Other long term (current) drug therapy: Secondary | ICD-10-CM | POA: Diagnosis not present

## 2023-11-10 DIAGNOSIS — K7689 Other specified diseases of liver: Secondary | ICD-10-CM | POA: Diagnosis not present

## 2023-11-10 DIAGNOSIS — E440 Moderate protein-calorie malnutrition: Secondary | ICD-10-CM | POA: Diagnosis not present

## 2023-11-10 DIAGNOSIS — E878 Other disorders of electrolyte and fluid balance, not elsewhere classified: Secondary | ICD-10-CM | POA: Diagnosis not present

## 2023-11-10 DIAGNOSIS — D630 Anemia in neoplastic disease: Secondary | ICD-10-CM | POA: Diagnosis not present

## 2023-11-11 DIAGNOSIS — E878 Other disorders of electrolyte and fluid balance, not elsewhere classified: Secondary | ICD-10-CM | POA: Diagnosis not present

## 2023-11-11 DIAGNOSIS — Z4932 Encounter for adequacy testing for peritoneal dialysis: Secondary | ICD-10-CM | POA: Diagnosis not present

## 2023-11-11 DIAGNOSIS — K7689 Other specified diseases of liver: Secondary | ICD-10-CM | POA: Diagnosis not present

## 2023-11-11 DIAGNOSIS — N186 End stage renal disease: Secondary | ICD-10-CM | POA: Diagnosis not present

## 2023-11-11 DIAGNOSIS — N2581 Secondary hyperparathyroidism of renal origin: Secondary | ICD-10-CM | POA: Diagnosis not present

## 2023-11-11 DIAGNOSIS — D630 Anemia in neoplastic disease: Secondary | ICD-10-CM | POA: Diagnosis not present

## 2023-11-11 DIAGNOSIS — Z79899 Other long term (current) drug therapy: Secondary | ICD-10-CM | POA: Diagnosis not present

## 2023-11-11 DIAGNOSIS — D509 Iron deficiency anemia, unspecified: Secondary | ICD-10-CM | POA: Diagnosis not present

## 2023-11-11 DIAGNOSIS — Z992 Dependence on renal dialysis: Secondary | ICD-10-CM | POA: Diagnosis not present

## 2023-11-11 DIAGNOSIS — E440 Moderate protein-calorie malnutrition: Secondary | ICD-10-CM | POA: Diagnosis not present

## 2023-11-12 DIAGNOSIS — N2581 Secondary hyperparathyroidism of renal origin: Secondary | ICD-10-CM | POA: Diagnosis not present

## 2023-11-12 DIAGNOSIS — D509 Iron deficiency anemia, unspecified: Secondary | ICD-10-CM | POA: Diagnosis not present

## 2023-11-12 DIAGNOSIS — K7689 Other specified diseases of liver: Secondary | ICD-10-CM | POA: Diagnosis not present

## 2023-11-12 DIAGNOSIS — E878 Other disorders of electrolyte and fluid balance, not elsewhere classified: Secondary | ICD-10-CM | POA: Diagnosis not present

## 2023-11-12 DIAGNOSIS — E440 Moderate protein-calorie malnutrition: Secondary | ICD-10-CM | POA: Diagnosis not present

## 2023-11-12 DIAGNOSIS — D630 Anemia in neoplastic disease: Secondary | ICD-10-CM | POA: Diagnosis not present

## 2023-11-12 DIAGNOSIS — N186 End stage renal disease: Secondary | ICD-10-CM | POA: Diagnosis not present

## 2023-11-12 DIAGNOSIS — Z992 Dependence on renal dialysis: Secondary | ICD-10-CM | POA: Diagnosis not present

## 2023-11-12 DIAGNOSIS — Z4932 Encounter for adequacy testing for peritoneal dialysis: Secondary | ICD-10-CM | POA: Diagnosis not present

## 2023-11-12 DIAGNOSIS — Z79899 Other long term (current) drug therapy: Secondary | ICD-10-CM | POA: Diagnosis not present

## 2023-11-13 DIAGNOSIS — D509 Iron deficiency anemia, unspecified: Secondary | ICD-10-CM | POA: Diagnosis not present

## 2023-11-13 DIAGNOSIS — D630 Anemia in neoplastic disease: Secondary | ICD-10-CM | POA: Diagnosis not present

## 2023-11-13 DIAGNOSIS — Z992 Dependence on renal dialysis: Secondary | ICD-10-CM | POA: Diagnosis not present

## 2023-11-13 DIAGNOSIS — E878 Other disorders of electrolyte and fluid balance, not elsewhere classified: Secondary | ICD-10-CM | POA: Diagnosis not present

## 2023-11-13 DIAGNOSIS — Z79899 Other long term (current) drug therapy: Secondary | ICD-10-CM | POA: Diagnosis not present

## 2023-11-13 DIAGNOSIS — N2581 Secondary hyperparathyroidism of renal origin: Secondary | ICD-10-CM | POA: Diagnosis not present

## 2023-11-13 DIAGNOSIS — K7689 Other specified diseases of liver: Secondary | ICD-10-CM | POA: Diagnosis not present

## 2023-11-13 DIAGNOSIS — Z4932 Encounter for adequacy testing for peritoneal dialysis: Secondary | ICD-10-CM | POA: Diagnosis not present

## 2023-11-13 DIAGNOSIS — N186 End stage renal disease: Secondary | ICD-10-CM | POA: Diagnosis not present

## 2023-11-13 DIAGNOSIS — E440 Moderate protein-calorie malnutrition: Secondary | ICD-10-CM | POA: Diagnosis not present

## 2023-11-14 DIAGNOSIS — D509 Iron deficiency anemia, unspecified: Secondary | ICD-10-CM | POA: Diagnosis not present

## 2023-11-14 DIAGNOSIS — E878 Other disorders of electrolyte and fluid balance, not elsewhere classified: Secondary | ICD-10-CM | POA: Diagnosis not present

## 2023-11-14 DIAGNOSIS — D630 Anemia in neoplastic disease: Secondary | ICD-10-CM | POA: Diagnosis not present

## 2023-11-14 DIAGNOSIS — Z4932 Encounter for adequacy testing for peritoneal dialysis: Secondary | ICD-10-CM | POA: Diagnosis not present

## 2023-11-14 DIAGNOSIS — E440 Moderate protein-calorie malnutrition: Secondary | ICD-10-CM | POA: Diagnosis not present

## 2023-11-14 DIAGNOSIS — N2581 Secondary hyperparathyroidism of renal origin: Secondary | ICD-10-CM | POA: Diagnosis not present

## 2023-11-14 DIAGNOSIS — Z992 Dependence on renal dialysis: Secondary | ICD-10-CM | POA: Diagnosis not present

## 2023-11-14 DIAGNOSIS — Z79899 Other long term (current) drug therapy: Secondary | ICD-10-CM | POA: Diagnosis not present

## 2023-11-14 DIAGNOSIS — N186 End stage renal disease: Secondary | ICD-10-CM | POA: Diagnosis not present

## 2023-11-14 DIAGNOSIS — K7689 Other specified diseases of liver: Secondary | ICD-10-CM | POA: Diagnosis not present

## 2023-11-15 DIAGNOSIS — D509 Iron deficiency anemia, unspecified: Secondary | ICD-10-CM | POA: Diagnosis not present

## 2023-11-15 DIAGNOSIS — Z4932 Encounter for adequacy testing for peritoneal dialysis: Secondary | ICD-10-CM | POA: Diagnosis not present

## 2023-11-15 DIAGNOSIS — D630 Anemia in neoplastic disease: Secondary | ICD-10-CM | POA: Diagnosis not present

## 2023-11-15 DIAGNOSIS — N2581 Secondary hyperparathyroidism of renal origin: Secondary | ICD-10-CM | POA: Diagnosis not present

## 2023-11-15 DIAGNOSIS — N186 End stage renal disease: Secondary | ICD-10-CM | POA: Diagnosis not present

## 2023-11-15 DIAGNOSIS — Z79899 Other long term (current) drug therapy: Secondary | ICD-10-CM | POA: Diagnosis not present

## 2023-11-15 DIAGNOSIS — K7689 Other specified diseases of liver: Secondary | ICD-10-CM | POA: Diagnosis not present

## 2023-11-15 DIAGNOSIS — E440 Moderate protein-calorie malnutrition: Secondary | ICD-10-CM | POA: Diagnosis not present

## 2023-11-15 DIAGNOSIS — Z992 Dependence on renal dialysis: Secondary | ICD-10-CM | POA: Diagnosis not present

## 2023-11-15 DIAGNOSIS — E878 Other disorders of electrolyte and fluid balance, not elsewhere classified: Secondary | ICD-10-CM | POA: Diagnosis not present

## 2023-11-16 DIAGNOSIS — Z79899 Other long term (current) drug therapy: Secondary | ICD-10-CM | POA: Diagnosis not present

## 2023-11-16 DIAGNOSIS — N186 End stage renal disease: Secondary | ICD-10-CM | POA: Diagnosis not present

## 2023-11-16 DIAGNOSIS — E878 Other disorders of electrolyte and fluid balance, not elsewhere classified: Secondary | ICD-10-CM | POA: Diagnosis not present

## 2023-11-16 DIAGNOSIS — Z992 Dependence on renal dialysis: Secondary | ICD-10-CM | POA: Diagnosis not present

## 2023-11-16 DIAGNOSIS — K7689 Other specified diseases of liver: Secondary | ICD-10-CM | POA: Diagnosis not present

## 2023-11-16 DIAGNOSIS — Z4932 Encounter for adequacy testing for peritoneal dialysis: Secondary | ICD-10-CM | POA: Diagnosis not present

## 2023-11-16 DIAGNOSIS — D630 Anemia in neoplastic disease: Secondary | ICD-10-CM | POA: Diagnosis not present

## 2023-11-16 DIAGNOSIS — N2581 Secondary hyperparathyroidism of renal origin: Secondary | ICD-10-CM | POA: Diagnosis not present

## 2023-11-16 DIAGNOSIS — D509 Iron deficiency anemia, unspecified: Secondary | ICD-10-CM | POA: Diagnosis not present

## 2023-11-16 DIAGNOSIS — E440 Moderate protein-calorie malnutrition: Secondary | ICD-10-CM | POA: Diagnosis not present

## 2023-11-17 DIAGNOSIS — E440 Moderate protein-calorie malnutrition: Secondary | ICD-10-CM | POA: Diagnosis not present

## 2023-11-17 DIAGNOSIS — E878 Other disorders of electrolyte and fluid balance, not elsewhere classified: Secondary | ICD-10-CM | POA: Diagnosis not present

## 2023-11-17 DIAGNOSIS — Z992 Dependence on renal dialysis: Secondary | ICD-10-CM | POA: Diagnosis not present

## 2023-11-17 DIAGNOSIS — Z79899 Other long term (current) drug therapy: Secondary | ICD-10-CM | POA: Diagnosis not present

## 2023-11-17 DIAGNOSIS — D509 Iron deficiency anemia, unspecified: Secondary | ICD-10-CM | POA: Diagnosis not present

## 2023-11-17 DIAGNOSIS — D630 Anemia in neoplastic disease: Secondary | ICD-10-CM | POA: Diagnosis not present

## 2023-11-17 DIAGNOSIS — Z4932 Encounter for adequacy testing for peritoneal dialysis: Secondary | ICD-10-CM | POA: Diagnosis not present

## 2023-11-17 DIAGNOSIS — N186 End stage renal disease: Secondary | ICD-10-CM | POA: Diagnosis not present

## 2023-11-17 DIAGNOSIS — N2581 Secondary hyperparathyroidism of renal origin: Secondary | ICD-10-CM | POA: Diagnosis not present

## 2023-11-17 DIAGNOSIS — K7689 Other specified diseases of liver: Secondary | ICD-10-CM | POA: Diagnosis not present

## 2023-11-18 DIAGNOSIS — K7689 Other specified diseases of liver: Secondary | ICD-10-CM | POA: Diagnosis not present

## 2023-11-18 DIAGNOSIS — Z992 Dependence on renal dialysis: Secondary | ICD-10-CM | POA: Diagnosis not present

## 2023-11-18 DIAGNOSIS — E440 Moderate protein-calorie malnutrition: Secondary | ICD-10-CM | POA: Diagnosis not present

## 2023-11-18 DIAGNOSIS — E878 Other disorders of electrolyte and fluid balance, not elsewhere classified: Secondary | ICD-10-CM | POA: Diagnosis not present

## 2023-11-18 DIAGNOSIS — D509 Iron deficiency anemia, unspecified: Secondary | ICD-10-CM | POA: Diagnosis not present

## 2023-11-18 DIAGNOSIS — N186 End stage renal disease: Secondary | ICD-10-CM | POA: Diagnosis not present

## 2023-11-18 DIAGNOSIS — Z79899 Other long term (current) drug therapy: Secondary | ICD-10-CM | POA: Diagnosis not present

## 2023-11-18 DIAGNOSIS — D630 Anemia in neoplastic disease: Secondary | ICD-10-CM | POA: Diagnosis not present

## 2023-11-18 DIAGNOSIS — Z4932 Encounter for adequacy testing for peritoneal dialysis: Secondary | ICD-10-CM | POA: Diagnosis not present

## 2023-11-18 DIAGNOSIS — N2581 Secondary hyperparathyroidism of renal origin: Secondary | ICD-10-CM | POA: Diagnosis not present

## 2023-11-19 DIAGNOSIS — Z79899 Other long term (current) drug therapy: Secondary | ICD-10-CM | POA: Diagnosis not present

## 2023-11-19 DIAGNOSIS — E440 Moderate protein-calorie malnutrition: Secondary | ICD-10-CM | POA: Diagnosis not present

## 2023-11-19 DIAGNOSIS — K7689 Other specified diseases of liver: Secondary | ICD-10-CM | POA: Diagnosis not present

## 2023-11-19 DIAGNOSIS — N186 End stage renal disease: Secondary | ICD-10-CM | POA: Diagnosis not present

## 2023-11-19 DIAGNOSIS — Z794 Long term (current) use of insulin: Secondary | ICD-10-CM | POA: Diagnosis not present

## 2023-11-19 DIAGNOSIS — D630 Anemia in neoplastic disease: Secondary | ICD-10-CM | POA: Diagnosis not present

## 2023-11-19 DIAGNOSIS — E1165 Type 2 diabetes mellitus with hyperglycemia: Secondary | ICD-10-CM | POA: Diagnosis not present

## 2023-11-19 DIAGNOSIS — D509 Iron deficiency anemia, unspecified: Secondary | ICD-10-CM | POA: Diagnosis not present

## 2023-11-19 DIAGNOSIS — N2581 Secondary hyperparathyroidism of renal origin: Secondary | ICD-10-CM | POA: Diagnosis not present

## 2023-11-19 DIAGNOSIS — Z4932 Encounter for adequacy testing for peritoneal dialysis: Secondary | ICD-10-CM | POA: Diagnosis not present

## 2023-11-19 DIAGNOSIS — E162 Hypoglycemia, unspecified: Secondary | ICD-10-CM | POA: Diagnosis not present

## 2023-11-19 DIAGNOSIS — E878 Other disorders of electrolyte and fluid balance, not elsewhere classified: Secondary | ICD-10-CM | POA: Diagnosis not present

## 2023-11-19 DIAGNOSIS — E1122 Type 2 diabetes mellitus with diabetic chronic kidney disease: Secondary | ICD-10-CM | POA: Diagnosis not present

## 2023-11-19 DIAGNOSIS — Z992 Dependence on renal dialysis: Secondary | ICD-10-CM | POA: Diagnosis not present

## 2023-11-20 DIAGNOSIS — E440 Moderate protein-calorie malnutrition: Secondary | ICD-10-CM | POA: Diagnosis not present

## 2023-11-20 DIAGNOSIS — D630 Anemia in neoplastic disease: Secondary | ICD-10-CM | POA: Diagnosis not present

## 2023-11-20 DIAGNOSIS — Z4932 Encounter for adequacy testing for peritoneal dialysis: Secondary | ICD-10-CM | POA: Diagnosis not present

## 2023-11-20 DIAGNOSIS — Z992 Dependence on renal dialysis: Secondary | ICD-10-CM | POA: Diagnosis not present

## 2023-11-20 DIAGNOSIS — N186 End stage renal disease: Secondary | ICD-10-CM | POA: Diagnosis not present

## 2023-11-20 DIAGNOSIS — D509 Iron deficiency anemia, unspecified: Secondary | ICD-10-CM | POA: Diagnosis not present

## 2023-11-20 DIAGNOSIS — E878 Other disorders of electrolyte and fluid balance, not elsewhere classified: Secondary | ICD-10-CM | POA: Diagnosis not present

## 2023-11-20 DIAGNOSIS — N2581 Secondary hyperparathyroidism of renal origin: Secondary | ICD-10-CM | POA: Diagnosis not present

## 2023-11-20 DIAGNOSIS — Z79899 Other long term (current) drug therapy: Secondary | ICD-10-CM | POA: Diagnosis not present

## 2023-11-20 DIAGNOSIS — K7689 Other specified diseases of liver: Secondary | ICD-10-CM | POA: Diagnosis not present

## 2023-11-21 DIAGNOSIS — E440 Moderate protein-calorie malnutrition: Secondary | ICD-10-CM | POA: Diagnosis not present

## 2023-11-21 DIAGNOSIS — Z79899 Other long term (current) drug therapy: Secondary | ICD-10-CM | POA: Diagnosis not present

## 2023-11-21 DIAGNOSIS — N186 End stage renal disease: Secondary | ICD-10-CM | POA: Diagnosis not present

## 2023-11-21 DIAGNOSIS — K7689 Other specified diseases of liver: Secondary | ICD-10-CM | POA: Diagnosis not present

## 2023-11-21 DIAGNOSIS — Z4932 Encounter for adequacy testing for peritoneal dialysis: Secondary | ICD-10-CM | POA: Diagnosis not present

## 2023-11-21 DIAGNOSIS — N2581 Secondary hyperparathyroidism of renal origin: Secondary | ICD-10-CM | POA: Diagnosis not present

## 2023-11-21 DIAGNOSIS — Z992 Dependence on renal dialysis: Secondary | ICD-10-CM | POA: Diagnosis not present

## 2023-11-21 DIAGNOSIS — D630 Anemia in neoplastic disease: Secondary | ICD-10-CM | POA: Diagnosis not present

## 2023-11-21 DIAGNOSIS — D509 Iron deficiency anemia, unspecified: Secondary | ICD-10-CM | POA: Diagnosis not present

## 2023-11-21 DIAGNOSIS — E878 Other disorders of electrolyte and fluid balance, not elsewhere classified: Secondary | ICD-10-CM | POA: Diagnosis not present

## 2023-11-22 DIAGNOSIS — N2581 Secondary hyperparathyroidism of renal origin: Secondary | ICD-10-CM | POA: Diagnosis not present

## 2023-11-22 DIAGNOSIS — D630 Anemia in neoplastic disease: Secondary | ICD-10-CM | POA: Diagnosis not present

## 2023-11-22 DIAGNOSIS — E440 Moderate protein-calorie malnutrition: Secondary | ICD-10-CM | POA: Diagnosis not present

## 2023-11-22 DIAGNOSIS — Z4932 Encounter for adequacy testing for peritoneal dialysis: Secondary | ICD-10-CM | POA: Diagnosis not present

## 2023-11-22 DIAGNOSIS — Z992 Dependence on renal dialysis: Secondary | ICD-10-CM | POA: Diagnosis not present

## 2023-11-22 DIAGNOSIS — E1129 Type 2 diabetes mellitus with other diabetic kidney complication: Secondary | ICD-10-CM | POA: Diagnosis not present

## 2023-11-22 DIAGNOSIS — Z79899 Other long term (current) drug therapy: Secondary | ICD-10-CM | POA: Diagnosis not present

## 2023-11-22 DIAGNOSIS — N186 End stage renal disease: Secondary | ICD-10-CM | POA: Diagnosis not present

## 2023-11-22 DIAGNOSIS — D509 Iron deficiency anemia, unspecified: Secondary | ICD-10-CM | POA: Diagnosis not present

## 2023-11-22 DIAGNOSIS — E878 Other disorders of electrolyte and fluid balance, not elsewhere classified: Secondary | ICD-10-CM | POA: Diagnosis not present

## 2023-11-22 DIAGNOSIS — K7689 Other specified diseases of liver: Secondary | ICD-10-CM | POA: Diagnosis not present

## 2023-11-23 DIAGNOSIS — D631 Anemia in chronic kidney disease: Secondary | ICD-10-CM | POA: Diagnosis not present

## 2023-11-23 DIAGNOSIS — Z4932 Encounter for adequacy testing for peritoneal dialysis: Secondary | ICD-10-CM | POA: Diagnosis not present

## 2023-11-23 DIAGNOSIS — Z992 Dependence on renal dialysis: Secondary | ICD-10-CM | POA: Diagnosis not present

## 2023-11-23 DIAGNOSIS — N2581 Secondary hyperparathyroidism of renal origin: Secondary | ICD-10-CM | POA: Diagnosis not present

## 2023-11-23 DIAGNOSIS — N186 End stage renal disease: Secondary | ICD-10-CM | POA: Diagnosis not present

## 2023-11-23 DIAGNOSIS — E440 Moderate protein-calorie malnutrition: Secondary | ICD-10-CM | POA: Diagnosis not present

## 2023-11-23 DIAGNOSIS — K7689 Other specified diseases of liver: Secondary | ICD-10-CM | POA: Diagnosis not present

## 2023-11-23 DIAGNOSIS — Z79899 Other long term (current) drug therapy: Secondary | ICD-10-CM | POA: Diagnosis not present

## 2023-11-23 DIAGNOSIS — E878 Other disorders of electrolyte and fluid balance, not elsewhere classified: Secondary | ICD-10-CM | POA: Diagnosis not present

## 2023-11-23 DIAGNOSIS — N185 Chronic kidney disease, stage 5: Secondary | ICD-10-CM | POA: Diagnosis not present

## 2023-11-23 DIAGNOSIS — D509 Iron deficiency anemia, unspecified: Secondary | ICD-10-CM | POA: Diagnosis not present

## 2023-11-24 DIAGNOSIS — D631 Anemia in chronic kidney disease: Secondary | ICD-10-CM | POA: Diagnosis not present

## 2023-11-24 DIAGNOSIS — E878 Other disorders of electrolyte and fluid balance, not elsewhere classified: Secondary | ICD-10-CM | POA: Diagnosis not present

## 2023-11-24 DIAGNOSIS — N185 Chronic kidney disease, stage 5: Secondary | ICD-10-CM | POA: Diagnosis not present

## 2023-11-24 DIAGNOSIS — N2581 Secondary hyperparathyroidism of renal origin: Secondary | ICD-10-CM | POA: Diagnosis not present

## 2023-11-24 DIAGNOSIS — Z992 Dependence on renal dialysis: Secondary | ICD-10-CM | POA: Diagnosis not present

## 2023-11-24 DIAGNOSIS — K7689 Other specified diseases of liver: Secondary | ICD-10-CM | POA: Diagnosis not present

## 2023-11-24 DIAGNOSIS — D509 Iron deficiency anemia, unspecified: Secondary | ICD-10-CM | POA: Diagnosis not present

## 2023-11-24 DIAGNOSIS — Z79899 Other long term (current) drug therapy: Secondary | ICD-10-CM | POA: Diagnosis not present

## 2023-11-24 DIAGNOSIS — N186 End stage renal disease: Secondary | ICD-10-CM | POA: Diagnosis not present

## 2023-11-24 DIAGNOSIS — E440 Moderate protein-calorie malnutrition: Secondary | ICD-10-CM | POA: Diagnosis not present

## 2023-11-24 DIAGNOSIS — Z4932 Encounter for adequacy testing for peritoneal dialysis: Secondary | ICD-10-CM | POA: Diagnosis not present

## 2023-11-25 DIAGNOSIS — E878 Other disorders of electrolyte and fluid balance, not elsewhere classified: Secondary | ICD-10-CM | POA: Diagnosis not present

## 2023-11-25 DIAGNOSIS — Z79899 Other long term (current) drug therapy: Secondary | ICD-10-CM | POA: Diagnosis not present

## 2023-11-25 DIAGNOSIS — E440 Moderate protein-calorie malnutrition: Secondary | ICD-10-CM | POA: Diagnosis not present

## 2023-11-25 DIAGNOSIS — E119 Type 2 diabetes mellitus without complications: Secondary | ICD-10-CM | POA: Diagnosis not present

## 2023-11-25 DIAGNOSIS — H35371 Puckering of macula, right eye: Secondary | ICD-10-CM | POA: Diagnosis not present

## 2023-11-25 DIAGNOSIS — H26491 Other secondary cataract, right eye: Secondary | ICD-10-CM | POA: Diagnosis not present

## 2023-11-25 DIAGNOSIS — H40013 Open angle with borderline findings, low risk, bilateral: Secondary | ICD-10-CM | POA: Diagnosis not present

## 2023-11-25 DIAGNOSIS — N2581 Secondary hyperparathyroidism of renal origin: Secondary | ICD-10-CM | POA: Diagnosis not present

## 2023-11-25 DIAGNOSIS — Z992 Dependence on renal dialysis: Secondary | ICD-10-CM | POA: Diagnosis not present

## 2023-11-25 DIAGNOSIS — N185 Chronic kidney disease, stage 5: Secondary | ICD-10-CM | POA: Diagnosis not present

## 2023-11-25 DIAGNOSIS — D631 Anemia in chronic kidney disease: Secondary | ICD-10-CM | POA: Diagnosis not present

## 2023-11-25 DIAGNOSIS — K7689 Other specified diseases of liver: Secondary | ICD-10-CM | POA: Diagnosis not present

## 2023-11-25 DIAGNOSIS — D509 Iron deficiency anemia, unspecified: Secondary | ICD-10-CM | POA: Diagnosis not present

## 2023-11-25 DIAGNOSIS — N186 End stage renal disease: Secondary | ICD-10-CM | POA: Diagnosis not present

## 2023-11-25 DIAGNOSIS — Z4932 Encounter for adequacy testing for peritoneal dialysis: Secondary | ICD-10-CM | POA: Diagnosis not present

## 2023-11-26 DIAGNOSIS — E878 Other disorders of electrolyte and fluid balance, not elsewhere classified: Secondary | ICD-10-CM | POA: Diagnosis not present

## 2023-11-26 DIAGNOSIS — E440 Moderate protein-calorie malnutrition: Secondary | ICD-10-CM | POA: Diagnosis not present

## 2023-11-26 DIAGNOSIS — D631 Anemia in chronic kidney disease: Secondary | ICD-10-CM | POA: Diagnosis not present

## 2023-11-26 DIAGNOSIS — Z79899 Other long term (current) drug therapy: Secondary | ICD-10-CM | POA: Diagnosis not present

## 2023-11-26 DIAGNOSIS — D509 Iron deficiency anemia, unspecified: Secondary | ICD-10-CM | POA: Diagnosis not present

## 2023-11-26 DIAGNOSIS — Z4932 Encounter for adequacy testing for peritoneal dialysis: Secondary | ICD-10-CM | POA: Diagnosis not present

## 2023-11-26 DIAGNOSIS — N2581 Secondary hyperparathyroidism of renal origin: Secondary | ICD-10-CM | POA: Diagnosis not present

## 2023-11-26 DIAGNOSIS — Z992 Dependence on renal dialysis: Secondary | ICD-10-CM | POA: Diagnosis not present

## 2023-11-26 DIAGNOSIS — K7689 Other specified diseases of liver: Secondary | ICD-10-CM | POA: Diagnosis not present

## 2023-11-26 DIAGNOSIS — N185 Chronic kidney disease, stage 5: Secondary | ICD-10-CM | POA: Diagnosis not present

## 2023-11-26 DIAGNOSIS — N186 End stage renal disease: Secondary | ICD-10-CM | POA: Diagnosis not present

## 2023-11-27 DIAGNOSIS — E440 Moderate protein-calorie malnutrition: Secondary | ICD-10-CM | POA: Diagnosis not present

## 2023-11-27 DIAGNOSIS — D631 Anemia in chronic kidney disease: Secondary | ICD-10-CM | POA: Diagnosis not present

## 2023-11-27 DIAGNOSIS — D509 Iron deficiency anemia, unspecified: Secondary | ICD-10-CM | POA: Diagnosis not present

## 2023-11-27 DIAGNOSIS — N186 End stage renal disease: Secondary | ICD-10-CM | POA: Diagnosis not present

## 2023-11-27 DIAGNOSIS — E878 Other disorders of electrolyte and fluid balance, not elsewhere classified: Secondary | ICD-10-CM | POA: Diagnosis not present

## 2023-11-27 DIAGNOSIS — N2581 Secondary hyperparathyroidism of renal origin: Secondary | ICD-10-CM | POA: Diagnosis not present

## 2023-11-27 DIAGNOSIS — Z4932 Encounter for adequacy testing for peritoneal dialysis: Secondary | ICD-10-CM | POA: Diagnosis not present

## 2023-11-27 DIAGNOSIS — Z992 Dependence on renal dialysis: Secondary | ICD-10-CM | POA: Diagnosis not present

## 2023-11-27 DIAGNOSIS — N185 Chronic kidney disease, stage 5: Secondary | ICD-10-CM | POA: Diagnosis not present

## 2023-11-27 DIAGNOSIS — Z79899 Other long term (current) drug therapy: Secondary | ICD-10-CM | POA: Diagnosis not present

## 2023-11-27 DIAGNOSIS — K7689 Other specified diseases of liver: Secondary | ICD-10-CM | POA: Diagnosis not present

## 2023-11-28 DIAGNOSIS — E440 Moderate protein-calorie malnutrition: Secondary | ICD-10-CM | POA: Diagnosis not present

## 2023-11-28 DIAGNOSIS — Z79899 Other long term (current) drug therapy: Secondary | ICD-10-CM | POA: Diagnosis not present

## 2023-11-28 DIAGNOSIS — N185 Chronic kidney disease, stage 5: Secondary | ICD-10-CM | POA: Diagnosis not present

## 2023-11-28 DIAGNOSIS — K7689 Other specified diseases of liver: Secondary | ICD-10-CM | POA: Diagnosis not present

## 2023-11-28 DIAGNOSIS — D509 Iron deficiency anemia, unspecified: Secondary | ICD-10-CM | POA: Diagnosis not present

## 2023-11-28 DIAGNOSIS — E878 Other disorders of electrolyte and fluid balance, not elsewhere classified: Secondary | ICD-10-CM | POA: Diagnosis not present

## 2023-11-28 DIAGNOSIS — D631 Anemia in chronic kidney disease: Secondary | ICD-10-CM | POA: Diagnosis not present

## 2023-11-28 DIAGNOSIS — Z992 Dependence on renal dialysis: Secondary | ICD-10-CM | POA: Diagnosis not present

## 2023-11-28 DIAGNOSIS — N2581 Secondary hyperparathyroidism of renal origin: Secondary | ICD-10-CM | POA: Diagnosis not present

## 2023-11-28 DIAGNOSIS — N186 End stage renal disease: Secondary | ICD-10-CM | POA: Diagnosis not present

## 2023-11-28 DIAGNOSIS — Z4932 Encounter for adequacy testing for peritoneal dialysis: Secondary | ICD-10-CM | POA: Diagnosis not present

## 2023-11-29 DIAGNOSIS — E440 Moderate protein-calorie malnutrition: Secondary | ICD-10-CM | POA: Diagnosis not present

## 2023-11-29 DIAGNOSIS — Z4932 Encounter for adequacy testing for peritoneal dialysis: Secondary | ICD-10-CM | POA: Diagnosis not present

## 2023-11-29 DIAGNOSIS — N185 Chronic kidney disease, stage 5: Secondary | ICD-10-CM | POA: Diagnosis not present

## 2023-11-29 DIAGNOSIS — D509 Iron deficiency anemia, unspecified: Secondary | ICD-10-CM | POA: Diagnosis not present

## 2023-11-29 DIAGNOSIS — Z992 Dependence on renal dialysis: Secondary | ICD-10-CM | POA: Diagnosis not present

## 2023-11-29 DIAGNOSIS — N2581 Secondary hyperparathyroidism of renal origin: Secondary | ICD-10-CM | POA: Diagnosis not present

## 2023-11-29 DIAGNOSIS — D631 Anemia in chronic kidney disease: Secondary | ICD-10-CM | POA: Diagnosis not present

## 2023-11-29 DIAGNOSIS — E878 Other disorders of electrolyte and fluid balance, not elsewhere classified: Secondary | ICD-10-CM | POA: Diagnosis not present

## 2023-11-29 DIAGNOSIS — N186 End stage renal disease: Secondary | ICD-10-CM | POA: Diagnosis not present

## 2023-11-29 DIAGNOSIS — Z79899 Other long term (current) drug therapy: Secondary | ICD-10-CM | POA: Diagnosis not present

## 2023-11-29 DIAGNOSIS — K7689 Other specified diseases of liver: Secondary | ICD-10-CM | POA: Diagnosis not present

## 2023-11-30 DIAGNOSIS — I4891 Unspecified atrial fibrillation: Secondary | ICD-10-CM | POA: Diagnosis not present

## 2023-11-30 DIAGNOSIS — R6521 Severe sepsis with septic shock: Secondary | ICD-10-CM | POA: Diagnosis not present

## 2023-11-30 DIAGNOSIS — N186 End stage renal disease: Secondary | ICD-10-CM | POA: Diagnosis not present

## 2023-11-30 DIAGNOSIS — Z992 Dependence on renal dialysis: Secondary | ICD-10-CM | POA: Diagnosis not present

## 2023-11-30 DIAGNOSIS — E878 Other disorders of electrolyte and fluid balance, not elsewhere classified: Secondary | ICD-10-CM | POA: Diagnosis not present

## 2023-11-30 DIAGNOSIS — Z79899 Other long term (current) drug therapy: Secondary | ICD-10-CM | POA: Diagnosis not present

## 2023-11-30 DIAGNOSIS — K7689 Other specified diseases of liver: Secondary | ICD-10-CM | POA: Diagnosis not present

## 2023-11-30 DIAGNOSIS — E440 Moderate protein-calorie malnutrition: Secondary | ICD-10-CM | POA: Diagnosis not present

## 2023-11-30 DIAGNOSIS — D509 Iron deficiency anemia, unspecified: Secondary | ICD-10-CM | POA: Diagnosis not present

## 2023-11-30 DIAGNOSIS — D631 Anemia in chronic kidney disease: Secondary | ICD-10-CM | POA: Diagnosis not present

## 2023-11-30 DIAGNOSIS — N185 Chronic kidney disease, stage 5: Secondary | ICD-10-CM | POA: Diagnosis not present

## 2023-11-30 DIAGNOSIS — Z4932 Encounter for adequacy testing for peritoneal dialysis: Secondary | ICD-10-CM | POA: Diagnosis not present

## 2023-11-30 DIAGNOSIS — A419 Sepsis, unspecified organism: Secondary | ICD-10-CM | POA: Diagnosis not present

## 2023-11-30 DIAGNOSIS — N2581 Secondary hyperparathyroidism of renal origin: Secondary | ICD-10-CM | POA: Diagnosis not present

## 2023-12-01 DIAGNOSIS — E878 Other disorders of electrolyte and fluid balance, not elsewhere classified: Secondary | ICD-10-CM | POA: Diagnosis not present

## 2023-12-01 DIAGNOSIS — Z79899 Other long term (current) drug therapy: Secondary | ICD-10-CM | POA: Diagnosis not present

## 2023-12-01 DIAGNOSIS — E440 Moderate protein-calorie malnutrition: Secondary | ICD-10-CM | POA: Diagnosis not present

## 2023-12-01 DIAGNOSIS — N185 Chronic kidney disease, stage 5: Secondary | ICD-10-CM | POA: Diagnosis not present

## 2023-12-01 DIAGNOSIS — K7689 Other specified diseases of liver: Secondary | ICD-10-CM | POA: Diagnosis not present

## 2023-12-01 DIAGNOSIS — D509 Iron deficiency anemia, unspecified: Secondary | ICD-10-CM | POA: Diagnosis not present

## 2023-12-01 DIAGNOSIS — D631 Anemia in chronic kidney disease: Secondary | ICD-10-CM | POA: Diagnosis not present

## 2023-12-01 DIAGNOSIS — Z4932 Encounter for adequacy testing for peritoneal dialysis: Secondary | ICD-10-CM | POA: Diagnosis not present

## 2023-12-01 DIAGNOSIS — Z992 Dependence on renal dialysis: Secondary | ICD-10-CM | POA: Diagnosis not present

## 2023-12-01 DIAGNOSIS — N186 End stage renal disease: Secondary | ICD-10-CM | POA: Diagnosis not present

## 2023-12-01 DIAGNOSIS — N2581 Secondary hyperparathyroidism of renal origin: Secondary | ICD-10-CM | POA: Diagnosis not present

## 2023-12-02 DIAGNOSIS — N2581 Secondary hyperparathyroidism of renal origin: Secondary | ICD-10-CM | POA: Diagnosis not present

## 2023-12-02 DIAGNOSIS — D509 Iron deficiency anemia, unspecified: Secondary | ICD-10-CM | POA: Diagnosis not present

## 2023-12-02 DIAGNOSIS — D631 Anemia in chronic kidney disease: Secondary | ICD-10-CM | POA: Diagnosis not present

## 2023-12-02 DIAGNOSIS — N185 Chronic kidney disease, stage 5: Secondary | ICD-10-CM | POA: Diagnosis not present

## 2023-12-02 DIAGNOSIS — Z79899 Other long term (current) drug therapy: Secondary | ICD-10-CM | POA: Diagnosis not present

## 2023-12-02 DIAGNOSIS — N186 End stage renal disease: Secondary | ICD-10-CM | POA: Diagnosis not present

## 2023-12-02 DIAGNOSIS — K7689 Other specified diseases of liver: Secondary | ICD-10-CM | POA: Diagnosis not present

## 2023-12-02 DIAGNOSIS — Z992 Dependence on renal dialysis: Secondary | ICD-10-CM | POA: Diagnosis not present

## 2023-12-02 DIAGNOSIS — E440 Moderate protein-calorie malnutrition: Secondary | ICD-10-CM | POA: Diagnosis not present

## 2023-12-02 DIAGNOSIS — E878 Other disorders of electrolyte and fluid balance, not elsewhere classified: Secondary | ICD-10-CM | POA: Diagnosis not present

## 2023-12-02 DIAGNOSIS — Z4932 Encounter for adequacy testing for peritoneal dialysis: Secondary | ICD-10-CM | POA: Diagnosis not present

## 2023-12-03 DIAGNOSIS — N186 End stage renal disease: Secondary | ICD-10-CM | POA: Diagnosis not present

## 2023-12-03 DIAGNOSIS — E878 Other disorders of electrolyte and fluid balance, not elsewhere classified: Secondary | ICD-10-CM | POA: Diagnosis not present

## 2023-12-03 DIAGNOSIS — Z992 Dependence on renal dialysis: Secondary | ICD-10-CM | POA: Diagnosis not present

## 2023-12-03 DIAGNOSIS — D631 Anemia in chronic kidney disease: Secondary | ICD-10-CM | POA: Diagnosis not present

## 2023-12-03 DIAGNOSIS — Z79899 Other long term (current) drug therapy: Secondary | ICD-10-CM | POA: Diagnosis not present

## 2023-12-03 DIAGNOSIS — E440 Moderate protein-calorie malnutrition: Secondary | ICD-10-CM | POA: Diagnosis not present

## 2023-12-03 DIAGNOSIS — K7689 Other specified diseases of liver: Secondary | ICD-10-CM | POA: Diagnosis not present

## 2023-12-03 DIAGNOSIS — D509 Iron deficiency anemia, unspecified: Secondary | ICD-10-CM | POA: Diagnosis not present

## 2023-12-03 DIAGNOSIS — Z4932 Encounter for adequacy testing for peritoneal dialysis: Secondary | ICD-10-CM | POA: Diagnosis not present

## 2023-12-03 DIAGNOSIS — N2581 Secondary hyperparathyroidism of renal origin: Secondary | ICD-10-CM | POA: Diagnosis not present

## 2023-12-03 DIAGNOSIS — N185 Chronic kidney disease, stage 5: Secondary | ICD-10-CM | POA: Diagnosis not present

## 2023-12-04 DIAGNOSIS — N2581 Secondary hyperparathyroidism of renal origin: Secondary | ICD-10-CM | POA: Diagnosis not present

## 2023-12-04 DIAGNOSIS — Z4932 Encounter for adequacy testing for peritoneal dialysis: Secondary | ICD-10-CM | POA: Diagnosis not present

## 2023-12-04 DIAGNOSIS — Z992 Dependence on renal dialysis: Secondary | ICD-10-CM | POA: Diagnosis not present

## 2023-12-04 DIAGNOSIS — K7689 Other specified diseases of liver: Secondary | ICD-10-CM | POA: Diagnosis not present

## 2023-12-04 DIAGNOSIS — E440 Moderate protein-calorie malnutrition: Secondary | ICD-10-CM | POA: Diagnosis not present

## 2023-12-04 DIAGNOSIS — N185 Chronic kidney disease, stage 5: Secondary | ICD-10-CM | POA: Diagnosis not present

## 2023-12-04 DIAGNOSIS — D631 Anemia in chronic kidney disease: Secondary | ICD-10-CM | POA: Diagnosis not present

## 2023-12-04 DIAGNOSIS — Z79899 Other long term (current) drug therapy: Secondary | ICD-10-CM | POA: Diagnosis not present

## 2023-12-04 DIAGNOSIS — D509 Iron deficiency anemia, unspecified: Secondary | ICD-10-CM | POA: Diagnosis not present

## 2023-12-04 DIAGNOSIS — N186 End stage renal disease: Secondary | ICD-10-CM | POA: Diagnosis not present

## 2023-12-04 DIAGNOSIS — E878 Other disorders of electrolyte and fluid balance, not elsewhere classified: Secondary | ICD-10-CM | POA: Diagnosis not present

## 2023-12-05 DIAGNOSIS — Z4932 Encounter for adequacy testing for peritoneal dialysis: Secondary | ICD-10-CM | POA: Diagnosis not present

## 2023-12-05 DIAGNOSIS — Z79899 Other long term (current) drug therapy: Secondary | ICD-10-CM | POA: Diagnosis not present

## 2023-12-05 DIAGNOSIS — N186 End stage renal disease: Secondary | ICD-10-CM | POA: Diagnosis not present

## 2023-12-05 DIAGNOSIS — E440 Moderate protein-calorie malnutrition: Secondary | ICD-10-CM | POA: Diagnosis not present

## 2023-12-05 DIAGNOSIS — E878 Other disorders of electrolyte and fluid balance, not elsewhere classified: Secondary | ICD-10-CM | POA: Diagnosis not present

## 2023-12-05 DIAGNOSIS — Z992 Dependence on renal dialysis: Secondary | ICD-10-CM | POA: Diagnosis not present

## 2023-12-05 DIAGNOSIS — N185 Chronic kidney disease, stage 5: Secondary | ICD-10-CM | POA: Diagnosis not present

## 2023-12-05 DIAGNOSIS — D509 Iron deficiency anemia, unspecified: Secondary | ICD-10-CM | POA: Diagnosis not present

## 2023-12-05 DIAGNOSIS — D631 Anemia in chronic kidney disease: Secondary | ICD-10-CM | POA: Diagnosis not present

## 2023-12-05 DIAGNOSIS — K7689 Other specified diseases of liver: Secondary | ICD-10-CM | POA: Diagnosis not present

## 2023-12-05 DIAGNOSIS — N2581 Secondary hyperparathyroidism of renal origin: Secondary | ICD-10-CM | POA: Diagnosis not present

## 2023-12-06 DIAGNOSIS — Z79899 Other long term (current) drug therapy: Secondary | ICD-10-CM | POA: Diagnosis not present

## 2023-12-06 DIAGNOSIS — D509 Iron deficiency anemia, unspecified: Secondary | ICD-10-CM | POA: Diagnosis not present

## 2023-12-06 DIAGNOSIS — Z992 Dependence on renal dialysis: Secondary | ICD-10-CM | POA: Diagnosis not present

## 2023-12-06 DIAGNOSIS — N2581 Secondary hyperparathyroidism of renal origin: Secondary | ICD-10-CM | POA: Diagnosis not present

## 2023-12-06 DIAGNOSIS — D631 Anemia in chronic kidney disease: Secondary | ICD-10-CM | POA: Diagnosis not present

## 2023-12-06 DIAGNOSIS — K7689 Other specified diseases of liver: Secondary | ICD-10-CM | POA: Diagnosis not present

## 2023-12-06 DIAGNOSIS — N186 End stage renal disease: Secondary | ICD-10-CM | POA: Diagnosis not present

## 2023-12-06 DIAGNOSIS — E878 Other disorders of electrolyte and fluid balance, not elsewhere classified: Secondary | ICD-10-CM | POA: Diagnosis not present

## 2023-12-06 DIAGNOSIS — N185 Chronic kidney disease, stage 5: Secondary | ICD-10-CM | POA: Diagnosis not present

## 2023-12-06 DIAGNOSIS — Z4932 Encounter for adequacy testing for peritoneal dialysis: Secondary | ICD-10-CM | POA: Diagnosis not present

## 2023-12-06 DIAGNOSIS — E440 Moderate protein-calorie malnutrition: Secondary | ICD-10-CM | POA: Diagnosis not present

## 2023-12-07 DIAGNOSIS — K7689 Other specified diseases of liver: Secondary | ICD-10-CM | POA: Diagnosis not present

## 2023-12-07 DIAGNOSIS — D631 Anemia in chronic kidney disease: Secondary | ICD-10-CM | POA: Diagnosis not present

## 2023-12-07 DIAGNOSIS — E878 Other disorders of electrolyte and fluid balance, not elsewhere classified: Secondary | ICD-10-CM | POA: Diagnosis not present

## 2023-12-07 DIAGNOSIS — Z992 Dependence on renal dialysis: Secondary | ICD-10-CM | POA: Diagnosis not present

## 2023-12-07 DIAGNOSIS — N185 Chronic kidney disease, stage 5: Secondary | ICD-10-CM | POA: Diagnosis not present

## 2023-12-07 DIAGNOSIS — D509 Iron deficiency anemia, unspecified: Secondary | ICD-10-CM | POA: Diagnosis not present

## 2023-12-07 DIAGNOSIS — N186 End stage renal disease: Secondary | ICD-10-CM | POA: Diagnosis not present

## 2023-12-07 DIAGNOSIS — Z4932 Encounter for adequacy testing for peritoneal dialysis: Secondary | ICD-10-CM | POA: Diagnosis not present

## 2023-12-07 DIAGNOSIS — Z79899 Other long term (current) drug therapy: Secondary | ICD-10-CM | POA: Diagnosis not present

## 2023-12-07 DIAGNOSIS — E440 Moderate protein-calorie malnutrition: Secondary | ICD-10-CM | POA: Diagnosis not present

## 2023-12-07 DIAGNOSIS — N2581 Secondary hyperparathyroidism of renal origin: Secondary | ICD-10-CM | POA: Diagnosis not present

## 2023-12-08 DIAGNOSIS — D509 Iron deficiency anemia, unspecified: Secondary | ICD-10-CM | POA: Diagnosis not present

## 2023-12-08 DIAGNOSIS — E440 Moderate protein-calorie malnutrition: Secondary | ICD-10-CM | POA: Diagnosis not present

## 2023-12-08 DIAGNOSIS — Z79899 Other long term (current) drug therapy: Secondary | ICD-10-CM | POA: Diagnosis not present

## 2023-12-08 DIAGNOSIS — Z4932 Encounter for adequacy testing for peritoneal dialysis: Secondary | ICD-10-CM | POA: Diagnosis not present

## 2023-12-08 DIAGNOSIS — E878 Other disorders of electrolyte and fluid balance, not elsewhere classified: Secondary | ICD-10-CM | POA: Diagnosis not present

## 2023-12-08 DIAGNOSIS — D631 Anemia in chronic kidney disease: Secondary | ICD-10-CM | POA: Diagnosis not present

## 2023-12-08 DIAGNOSIS — Z992 Dependence on renal dialysis: Secondary | ICD-10-CM | POA: Diagnosis not present

## 2023-12-08 DIAGNOSIS — N185 Chronic kidney disease, stage 5: Secondary | ICD-10-CM | POA: Diagnosis not present

## 2023-12-08 DIAGNOSIS — N186 End stage renal disease: Secondary | ICD-10-CM | POA: Diagnosis not present

## 2023-12-08 DIAGNOSIS — N2581 Secondary hyperparathyroidism of renal origin: Secondary | ICD-10-CM | POA: Diagnosis not present

## 2023-12-08 DIAGNOSIS — K7689 Other specified diseases of liver: Secondary | ICD-10-CM | POA: Diagnosis not present

## 2023-12-09 DIAGNOSIS — K7689 Other specified diseases of liver: Secondary | ICD-10-CM | POA: Diagnosis not present

## 2023-12-09 DIAGNOSIS — N2581 Secondary hyperparathyroidism of renal origin: Secondary | ICD-10-CM | POA: Diagnosis not present

## 2023-12-09 DIAGNOSIS — D509 Iron deficiency anemia, unspecified: Secondary | ICD-10-CM | POA: Diagnosis not present

## 2023-12-09 DIAGNOSIS — D631 Anemia in chronic kidney disease: Secondary | ICD-10-CM | POA: Diagnosis not present

## 2023-12-09 DIAGNOSIS — E440 Moderate protein-calorie malnutrition: Secondary | ICD-10-CM | POA: Diagnosis not present

## 2023-12-09 DIAGNOSIS — N185 Chronic kidney disease, stage 5: Secondary | ICD-10-CM | POA: Diagnosis not present

## 2023-12-09 DIAGNOSIS — Z79899 Other long term (current) drug therapy: Secondary | ICD-10-CM | POA: Diagnosis not present

## 2023-12-09 DIAGNOSIS — E878 Other disorders of electrolyte and fluid balance, not elsewhere classified: Secondary | ICD-10-CM | POA: Diagnosis not present

## 2023-12-09 DIAGNOSIS — Z4932 Encounter for adequacy testing for peritoneal dialysis: Secondary | ICD-10-CM | POA: Diagnosis not present

## 2023-12-09 DIAGNOSIS — Z992 Dependence on renal dialysis: Secondary | ICD-10-CM | POA: Diagnosis not present

## 2023-12-09 DIAGNOSIS — N186 End stage renal disease: Secondary | ICD-10-CM | POA: Diagnosis not present

## 2023-12-10 DIAGNOSIS — Z79899 Other long term (current) drug therapy: Secondary | ICD-10-CM | POA: Diagnosis not present

## 2023-12-10 DIAGNOSIS — E440 Moderate protein-calorie malnutrition: Secondary | ICD-10-CM | POA: Diagnosis not present

## 2023-12-10 DIAGNOSIS — K7689 Other specified diseases of liver: Secondary | ICD-10-CM | POA: Diagnosis not present

## 2023-12-10 DIAGNOSIS — N185 Chronic kidney disease, stage 5: Secondary | ICD-10-CM | POA: Diagnosis not present

## 2023-12-10 DIAGNOSIS — N186 End stage renal disease: Secondary | ICD-10-CM | POA: Diagnosis not present

## 2023-12-10 DIAGNOSIS — E878 Other disorders of electrolyte and fluid balance, not elsewhere classified: Secondary | ICD-10-CM | POA: Diagnosis not present

## 2023-12-10 DIAGNOSIS — Z4932 Encounter for adequacy testing for peritoneal dialysis: Secondary | ICD-10-CM | POA: Diagnosis not present

## 2023-12-10 DIAGNOSIS — Z992 Dependence on renal dialysis: Secondary | ICD-10-CM | POA: Diagnosis not present

## 2023-12-10 DIAGNOSIS — D509 Iron deficiency anemia, unspecified: Secondary | ICD-10-CM | POA: Diagnosis not present

## 2023-12-10 DIAGNOSIS — N2581 Secondary hyperparathyroidism of renal origin: Secondary | ICD-10-CM | POA: Diagnosis not present

## 2023-12-10 DIAGNOSIS — D631 Anemia in chronic kidney disease: Secondary | ICD-10-CM | POA: Diagnosis not present

## 2023-12-11 DIAGNOSIS — Z79899 Other long term (current) drug therapy: Secondary | ICD-10-CM | POA: Diagnosis not present

## 2023-12-11 DIAGNOSIS — Z4932 Encounter for adequacy testing for peritoneal dialysis: Secondary | ICD-10-CM | POA: Diagnosis not present

## 2023-12-11 DIAGNOSIS — N2581 Secondary hyperparathyroidism of renal origin: Secondary | ICD-10-CM | POA: Diagnosis not present

## 2023-12-11 DIAGNOSIS — D631 Anemia in chronic kidney disease: Secondary | ICD-10-CM | POA: Diagnosis not present

## 2023-12-11 DIAGNOSIS — Z992 Dependence on renal dialysis: Secondary | ICD-10-CM | POA: Diagnosis not present

## 2023-12-11 DIAGNOSIS — K7689 Other specified diseases of liver: Secondary | ICD-10-CM | POA: Diagnosis not present

## 2023-12-11 DIAGNOSIS — N185 Chronic kidney disease, stage 5: Secondary | ICD-10-CM | POA: Diagnosis not present

## 2023-12-11 DIAGNOSIS — D509 Iron deficiency anemia, unspecified: Secondary | ICD-10-CM | POA: Diagnosis not present

## 2023-12-11 DIAGNOSIS — N186 End stage renal disease: Secondary | ICD-10-CM | POA: Diagnosis not present

## 2023-12-11 DIAGNOSIS — E440 Moderate protein-calorie malnutrition: Secondary | ICD-10-CM | POA: Diagnosis not present

## 2023-12-11 DIAGNOSIS — E878 Other disorders of electrolyte and fluid balance, not elsewhere classified: Secondary | ICD-10-CM | POA: Diagnosis not present

## 2023-12-12 DIAGNOSIS — Z4932 Encounter for adequacy testing for peritoneal dialysis: Secondary | ICD-10-CM | POA: Diagnosis not present

## 2023-12-12 DIAGNOSIS — N2581 Secondary hyperparathyroidism of renal origin: Secondary | ICD-10-CM | POA: Diagnosis not present

## 2023-12-12 DIAGNOSIS — E878 Other disorders of electrolyte and fluid balance, not elsewhere classified: Secondary | ICD-10-CM | POA: Diagnosis not present

## 2023-12-12 DIAGNOSIS — Z79899 Other long term (current) drug therapy: Secondary | ICD-10-CM | POA: Diagnosis not present

## 2023-12-12 DIAGNOSIS — N186 End stage renal disease: Secondary | ICD-10-CM | POA: Diagnosis not present

## 2023-12-12 DIAGNOSIS — D509 Iron deficiency anemia, unspecified: Secondary | ICD-10-CM | POA: Diagnosis not present

## 2023-12-12 DIAGNOSIS — K7689 Other specified diseases of liver: Secondary | ICD-10-CM | POA: Diagnosis not present

## 2023-12-12 DIAGNOSIS — Z992 Dependence on renal dialysis: Secondary | ICD-10-CM | POA: Diagnosis not present

## 2023-12-12 DIAGNOSIS — N185 Chronic kidney disease, stage 5: Secondary | ICD-10-CM | POA: Diagnosis not present

## 2023-12-12 DIAGNOSIS — D631 Anemia in chronic kidney disease: Secondary | ICD-10-CM | POA: Diagnosis not present

## 2023-12-12 DIAGNOSIS — E440 Moderate protein-calorie malnutrition: Secondary | ICD-10-CM | POA: Diagnosis not present

## 2023-12-13 DIAGNOSIS — N2581 Secondary hyperparathyroidism of renal origin: Secondary | ICD-10-CM | POA: Diagnosis not present

## 2023-12-13 DIAGNOSIS — N186 End stage renal disease: Secondary | ICD-10-CM | POA: Diagnosis not present

## 2023-12-13 DIAGNOSIS — Z992 Dependence on renal dialysis: Secondary | ICD-10-CM | POA: Diagnosis not present

## 2023-12-13 DIAGNOSIS — D631 Anemia in chronic kidney disease: Secondary | ICD-10-CM | POA: Diagnosis not present

## 2023-12-13 DIAGNOSIS — E440 Moderate protein-calorie malnutrition: Secondary | ICD-10-CM | POA: Diagnosis not present

## 2023-12-13 DIAGNOSIS — Z4932 Encounter for adequacy testing for peritoneal dialysis: Secondary | ICD-10-CM | POA: Diagnosis not present

## 2023-12-13 DIAGNOSIS — E878 Other disorders of electrolyte and fluid balance, not elsewhere classified: Secondary | ICD-10-CM | POA: Diagnosis not present

## 2023-12-13 DIAGNOSIS — N185 Chronic kidney disease, stage 5: Secondary | ICD-10-CM | POA: Diagnosis not present

## 2023-12-13 DIAGNOSIS — K7689 Other specified diseases of liver: Secondary | ICD-10-CM | POA: Diagnosis not present

## 2023-12-13 DIAGNOSIS — Z79899 Other long term (current) drug therapy: Secondary | ICD-10-CM | POA: Diagnosis not present

## 2023-12-13 DIAGNOSIS — D509 Iron deficiency anemia, unspecified: Secondary | ICD-10-CM | POA: Diagnosis not present

## 2023-12-14 DIAGNOSIS — N2581 Secondary hyperparathyroidism of renal origin: Secondary | ICD-10-CM | POA: Diagnosis not present

## 2023-12-14 DIAGNOSIS — Z4932 Encounter for adequacy testing for peritoneal dialysis: Secondary | ICD-10-CM | POA: Diagnosis not present

## 2023-12-14 DIAGNOSIS — E878 Other disorders of electrolyte and fluid balance, not elsewhere classified: Secondary | ICD-10-CM | POA: Diagnosis not present

## 2023-12-14 DIAGNOSIS — N186 End stage renal disease: Secondary | ICD-10-CM | POA: Diagnosis not present

## 2023-12-14 DIAGNOSIS — Z79899 Other long term (current) drug therapy: Secondary | ICD-10-CM | POA: Diagnosis not present

## 2023-12-14 DIAGNOSIS — K7689 Other specified diseases of liver: Secondary | ICD-10-CM | POA: Diagnosis not present

## 2023-12-14 DIAGNOSIS — D509 Iron deficiency anemia, unspecified: Secondary | ICD-10-CM | POA: Diagnosis not present

## 2023-12-14 DIAGNOSIS — N185 Chronic kidney disease, stage 5: Secondary | ICD-10-CM | POA: Diagnosis not present

## 2023-12-14 DIAGNOSIS — E440 Moderate protein-calorie malnutrition: Secondary | ICD-10-CM | POA: Diagnosis not present

## 2023-12-14 DIAGNOSIS — D631 Anemia in chronic kidney disease: Secondary | ICD-10-CM | POA: Diagnosis not present

## 2023-12-14 DIAGNOSIS — Z992 Dependence on renal dialysis: Secondary | ICD-10-CM | POA: Diagnosis not present

## 2023-12-15 DIAGNOSIS — D509 Iron deficiency anemia, unspecified: Secondary | ICD-10-CM | POA: Diagnosis not present

## 2023-12-15 DIAGNOSIS — Z4932 Encounter for adequacy testing for peritoneal dialysis: Secondary | ICD-10-CM | POA: Diagnosis not present

## 2023-12-15 DIAGNOSIS — N185 Chronic kidney disease, stage 5: Secondary | ICD-10-CM | POA: Diagnosis not present

## 2023-12-15 DIAGNOSIS — K7689 Other specified diseases of liver: Secondary | ICD-10-CM | POA: Diagnosis not present

## 2023-12-15 DIAGNOSIS — N2581 Secondary hyperparathyroidism of renal origin: Secondary | ICD-10-CM | POA: Diagnosis not present

## 2023-12-15 DIAGNOSIS — N186 End stage renal disease: Secondary | ICD-10-CM | POA: Diagnosis not present

## 2023-12-15 DIAGNOSIS — E440 Moderate protein-calorie malnutrition: Secondary | ICD-10-CM | POA: Diagnosis not present

## 2023-12-15 DIAGNOSIS — D631 Anemia in chronic kidney disease: Secondary | ICD-10-CM | POA: Diagnosis not present

## 2023-12-15 DIAGNOSIS — Z79899 Other long term (current) drug therapy: Secondary | ICD-10-CM | POA: Diagnosis not present

## 2023-12-15 DIAGNOSIS — Z992 Dependence on renal dialysis: Secondary | ICD-10-CM | POA: Diagnosis not present

## 2023-12-15 DIAGNOSIS — E878 Other disorders of electrolyte and fluid balance, not elsewhere classified: Secondary | ICD-10-CM | POA: Diagnosis not present

## 2023-12-16 DIAGNOSIS — Z79899 Other long term (current) drug therapy: Secondary | ICD-10-CM | POA: Diagnosis not present

## 2023-12-16 DIAGNOSIS — Z4932 Encounter for adequacy testing for peritoneal dialysis: Secondary | ICD-10-CM | POA: Diagnosis not present

## 2023-12-16 DIAGNOSIS — E440 Moderate protein-calorie malnutrition: Secondary | ICD-10-CM | POA: Diagnosis not present

## 2023-12-16 DIAGNOSIS — E878 Other disorders of electrolyte and fluid balance, not elsewhere classified: Secondary | ICD-10-CM | POA: Diagnosis not present

## 2023-12-16 DIAGNOSIS — Z992 Dependence on renal dialysis: Secondary | ICD-10-CM | POA: Diagnosis not present

## 2023-12-16 DIAGNOSIS — N185 Chronic kidney disease, stage 5: Secondary | ICD-10-CM | POA: Diagnosis not present

## 2023-12-16 DIAGNOSIS — D631 Anemia in chronic kidney disease: Secondary | ICD-10-CM | POA: Diagnosis not present

## 2023-12-16 DIAGNOSIS — K7689 Other specified diseases of liver: Secondary | ICD-10-CM | POA: Diagnosis not present

## 2023-12-16 DIAGNOSIS — N186 End stage renal disease: Secondary | ICD-10-CM | POA: Diagnosis not present

## 2023-12-16 DIAGNOSIS — D509 Iron deficiency anemia, unspecified: Secondary | ICD-10-CM | POA: Diagnosis not present

## 2023-12-16 DIAGNOSIS — N2581 Secondary hyperparathyroidism of renal origin: Secondary | ICD-10-CM | POA: Diagnosis not present

## 2023-12-17 DIAGNOSIS — D631 Anemia in chronic kidney disease: Secondary | ICD-10-CM | POA: Diagnosis not present

## 2023-12-17 DIAGNOSIS — N2581 Secondary hyperparathyroidism of renal origin: Secondary | ICD-10-CM | POA: Diagnosis not present

## 2023-12-17 DIAGNOSIS — Z992 Dependence on renal dialysis: Secondary | ICD-10-CM | POA: Diagnosis not present

## 2023-12-17 DIAGNOSIS — E878 Other disorders of electrolyte and fluid balance, not elsewhere classified: Secondary | ICD-10-CM | POA: Diagnosis not present

## 2023-12-17 DIAGNOSIS — K7689 Other specified diseases of liver: Secondary | ICD-10-CM | POA: Diagnosis not present

## 2023-12-17 DIAGNOSIS — N186 End stage renal disease: Secondary | ICD-10-CM | POA: Diagnosis not present

## 2023-12-17 DIAGNOSIS — E440 Moderate protein-calorie malnutrition: Secondary | ICD-10-CM | POA: Diagnosis not present

## 2023-12-17 DIAGNOSIS — Z4932 Encounter for adequacy testing for peritoneal dialysis: Secondary | ICD-10-CM | POA: Diagnosis not present

## 2023-12-17 DIAGNOSIS — Z79899 Other long term (current) drug therapy: Secondary | ICD-10-CM | POA: Diagnosis not present

## 2023-12-17 DIAGNOSIS — D509 Iron deficiency anemia, unspecified: Secondary | ICD-10-CM | POA: Diagnosis not present

## 2023-12-17 DIAGNOSIS — N185 Chronic kidney disease, stage 5: Secondary | ICD-10-CM | POA: Diagnosis not present

## 2023-12-18 DIAGNOSIS — Z992 Dependence on renal dialysis: Secondary | ICD-10-CM | POA: Diagnosis not present

## 2023-12-18 DIAGNOSIS — K7689 Other specified diseases of liver: Secondary | ICD-10-CM | POA: Diagnosis not present

## 2023-12-18 DIAGNOSIS — E440 Moderate protein-calorie malnutrition: Secondary | ICD-10-CM | POA: Diagnosis not present

## 2023-12-18 DIAGNOSIS — E878 Other disorders of electrolyte and fluid balance, not elsewhere classified: Secondary | ICD-10-CM | POA: Diagnosis not present

## 2023-12-18 DIAGNOSIS — N2581 Secondary hyperparathyroidism of renal origin: Secondary | ICD-10-CM | POA: Diagnosis not present

## 2023-12-18 DIAGNOSIS — N186 End stage renal disease: Secondary | ICD-10-CM | POA: Diagnosis not present

## 2023-12-18 DIAGNOSIS — Z4932 Encounter for adequacy testing for peritoneal dialysis: Secondary | ICD-10-CM | POA: Diagnosis not present

## 2023-12-18 DIAGNOSIS — D509 Iron deficiency anemia, unspecified: Secondary | ICD-10-CM | POA: Diagnosis not present

## 2023-12-18 DIAGNOSIS — D631 Anemia in chronic kidney disease: Secondary | ICD-10-CM | POA: Diagnosis not present

## 2023-12-18 DIAGNOSIS — Z79899 Other long term (current) drug therapy: Secondary | ICD-10-CM | POA: Diagnosis not present

## 2023-12-18 DIAGNOSIS — N185 Chronic kidney disease, stage 5: Secondary | ICD-10-CM | POA: Diagnosis not present

## 2023-12-19 DIAGNOSIS — N2581 Secondary hyperparathyroidism of renal origin: Secondary | ICD-10-CM | POA: Diagnosis not present

## 2023-12-19 DIAGNOSIS — E878 Other disorders of electrolyte and fluid balance, not elsewhere classified: Secondary | ICD-10-CM | POA: Diagnosis not present

## 2023-12-19 DIAGNOSIS — Z79899 Other long term (current) drug therapy: Secondary | ICD-10-CM | POA: Diagnosis not present

## 2023-12-19 DIAGNOSIS — Z992 Dependence on renal dialysis: Secondary | ICD-10-CM | POA: Diagnosis not present

## 2023-12-19 DIAGNOSIS — D509 Iron deficiency anemia, unspecified: Secondary | ICD-10-CM | POA: Diagnosis not present

## 2023-12-19 DIAGNOSIS — N186 End stage renal disease: Secondary | ICD-10-CM | POA: Diagnosis not present

## 2023-12-19 DIAGNOSIS — D631 Anemia in chronic kidney disease: Secondary | ICD-10-CM | POA: Diagnosis not present

## 2023-12-19 DIAGNOSIS — Z4932 Encounter for adequacy testing for peritoneal dialysis: Secondary | ICD-10-CM | POA: Diagnosis not present

## 2023-12-19 DIAGNOSIS — E440 Moderate protein-calorie malnutrition: Secondary | ICD-10-CM | POA: Diagnosis not present

## 2023-12-19 DIAGNOSIS — N185 Chronic kidney disease, stage 5: Secondary | ICD-10-CM | POA: Diagnosis not present

## 2023-12-19 DIAGNOSIS — K7689 Other specified diseases of liver: Secondary | ICD-10-CM | POA: Diagnosis not present

## 2023-12-20 DIAGNOSIS — K7689 Other specified diseases of liver: Secondary | ICD-10-CM | POA: Diagnosis not present

## 2023-12-20 DIAGNOSIS — E878 Other disorders of electrolyte and fluid balance, not elsewhere classified: Secondary | ICD-10-CM | POA: Diagnosis not present

## 2023-12-20 DIAGNOSIS — D509 Iron deficiency anemia, unspecified: Secondary | ICD-10-CM | POA: Diagnosis not present

## 2023-12-20 DIAGNOSIS — Z4932 Encounter for adequacy testing for peritoneal dialysis: Secondary | ICD-10-CM | POA: Diagnosis not present

## 2023-12-20 DIAGNOSIS — N186 End stage renal disease: Secondary | ICD-10-CM | POA: Diagnosis not present

## 2023-12-20 DIAGNOSIS — N2581 Secondary hyperparathyroidism of renal origin: Secondary | ICD-10-CM | POA: Diagnosis not present

## 2023-12-20 DIAGNOSIS — D631 Anemia in chronic kidney disease: Secondary | ICD-10-CM | POA: Diagnosis not present

## 2023-12-20 DIAGNOSIS — N185 Chronic kidney disease, stage 5: Secondary | ICD-10-CM | POA: Diagnosis not present

## 2023-12-20 DIAGNOSIS — Z992 Dependence on renal dialysis: Secondary | ICD-10-CM | POA: Diagnosis not present

## 2023-12-20 DIAGNOSIS — Z79899 Other long term (current) drug therapy: Secondary | ICD-10-CM | POA: Diagnosis not present

## 2023-12-20 DIAGNOSIS — E440 Moderate protein-calorie malnutrition: Secondary | ICD-10-CM | POA: Diagnosis not present

## 2023-12-21 DIAGNOSIS — K7689 Other specified diseases of liver: Secondary | ICD-10-CM | POA: Diagnosis not present

## 2023-12-21 DIAGNOSIS — Z4932 Encounter for adequacy testing for peritoneal dialysis: Secondary | ICD-10-CM | POA: Diagnosis not present

## 2023-12-21 DIAGNOSIS — E440 Moderate protein-calorie malnutrition: Secondary | ICD-10-CM | POA: Diagnosis not present

## 2023-12-21 DIAGNOSIS — N2581 Secondary hyperparathyroidism of renal origin: Secondary | ICD-10-CM | POA: Diagnosis not present

## 2023-12-21 DIAGNOSIS — E878 Other disorders of electrolyte and fluid balance, not elsewhere classified: Secondary | ICD-10-CM | POA: Diagnosis not present

## 2023-12-21 DIAGNOSIS — N185 Chronic kidney disease, stage 5: Secondary | ICD-10-CM | POA: Diagnosis not present

## 2023-12-21 DIAGNOSIS — D509 Iron deficiency anemia, unspecified: Secondary | ICD-10-CM | POA: Diagnosis not present

## 2023-12-21 DIAGNOSIS — D631 Anemia in chronic kidney disease: Secondary | ICD-10-CM | POA: Diagnosis not present

## 2023-12-21 DIAGNOSIS — Z79899 Other long term (current) drug therapy: Secondary | ICD-10-CM | POA: Diagnosis not present

## 2023-12-21 DIAGNOSIS — Z992 Dependence on renal dialysis: Secondary | ICD-10-CM | POA: Diagnosis not present

## 2023-12-21 DIAGNOSIS — N186 End stage renal disease: Secondary | ICD-10-CM | POA: Diagnosis not present

## 2023-12-22 DIAGNOSIS — E440 Moderate protein-calorie malnutrition: Secondary | ICD-10-CM | POA: Diagnosis not present

## 2023-12-22 DIAGNOSIS — N2581 Secondary hyperparathyroidism of renal origin: Secondary | ICD-10-CM | POA: Diagnosis not present

## 2023-12-22 DIAGNOSIS — K7689 Other specified diseases of liver: Secondary | ICD-10-CM | POA: Diagnosis not present

## 2023-12-22 DIAGNOSIS — N185 Chronic kidney disease, stage 5: Secondary | ICD-10-CM | POA: Diagnosis not present

## 2023-12-22 DIAGNOSIS — D509 Iron deficiency anemia, unspecified: Secondary | ICD-10-CM | POA: Diagnosis not present

## 2023-12-22 DIAGNOSIS — D631 Anemia in chronic kidney disease: Secondary | ICD-10-CM | POA: Diagnosis not present

## 2023-12-22 DIAGNOSIS — N186 End stage renal disease: Secondary | ICD-10-CM | POA: Diagnosis not present

## 2023-12-22 DIAGNOSIS — Z992 Dependence on renal dialysis: Secondary | ICD-10-CM | POA: Diagnosis not present

## 2023-12-22 DIAGNOSIS — Z79899 Other long term (current) drug therapy: Secondary | ICD-10-CM | POA: Diagnosis not present

## 2023-12-22 DIAGNOSIS — Z4932 Encounter for adequacy testing for peritoneal dialysis: Secondary | ICD-10-CM | POA: Diagnosis not present

## 2023-12-22 DIAGNOSIS — E878 Other disorders of electrolyte and fluid balance, not elsewhere classified: Secondary | ICD-10-CM | POA: Diagnosis not present

## 2023-12-23 DIAGNOSIS — J9610 Chronic respiratory failure, unspecified whether with hypoxia or hypercapnia: Secondary | ICD-10-CM | POA: Diagnosis not present

## 2023-12-23 DIAGNOSIS — E1129 Type 2 diabetes mellitus with other diabetic kidney complication: Secondary | ICD-10-CM | POA: Diagnosis not present

## 2023-12-23 DIAGNOSIS — E440 Moderate protein-calorie malnutrition: Secondary | ICD-10-CM | POA: Diagnosis not present

## 2023-12-23 DIAGNOSIS — E878 Other disorders of electrolyte and fluid balance, not elsewhere classified: Secondary | ICD-10-CM | POA: Diagnosis not present

## 2023-12-23 DIAGNOSIS — N186 End stage renal disease: Secondary | ICD-10-CM | POA: Diagnosis not present

## 2023-12-23 DIAGNOSIS — N185 Chronic kidney disease, stage 5: Secondary | ICD-10-CM | POA: Diagnosis not present

## 2023-12-23 DIAGNOSIS — N2581 Secondary hyperparathyroidism of renal origin: Secondary | ICD-10-CM | POA: Diagnosis not present

## 2023-12-23 DIAGNOSIS — K7689 Other specified diseases of liver: Secondary | ICD-10-CM | POA: Diagnosis not present

## 2023-12-23 DIAGNOSIS — Z79899 Other long term (current) drug therapy: Secondary | ICD-10-CM | POA: Diagnosis not present

## 2023-12-23 DIAGNOSIS — D631 Anemia in chronic kidney disease: Secondary | ICD-10-CM | POA: Diagnosis not present

## 2023-12-23 DIAGNOSIS — Z4932 Encounter for adequacy testing for peritoneal dialysis: Secondary | ICD-10-CM | POA: Diagnosis not present

## 2023-12-23 DIAGNOSIS — Z992 Dependence on renal dialysis: Secondary | ICD-10-CM | POA: Diagnosis not present

## 2023-12-23 DIAGNOSIS — G4733 Obstructive sleep apnea (adult) (pediatric): Secondary | ICD-10-CM | POA: Diagnosis not present

## 2023-12-23 DIAGNOSIS — D509 Iron deficiency anemia, unspecified: Secondary | ICD-10-CM | POA: Diagnosis not present

## 2023-12-24 DIAGNOSIS — D630 Anemia in neoplastic disease: Secondary | ICD-10-CM | POA: Diagnosis not present

## 2023-12-24 DIAGNOSIS — Z4932 Encounter for adequacy testing for peritoneal dialysis: Secondary | ICD-10-CM | POA: Diagnosis not present

## 2023-12-24 DIAGNOSIS — K7689 Other specified diseases of liver: Secondary | ICD-10-CM | POA: Diagnosis not present

## 2023-12-24 DIAGNOSIS — N2581 Secondary hyperparathyroidism of renal origin: Secondary | ICD-10-CM | POA: Diagnosis not present

## 2023-12-24 DIAGNOSIS — N2589 Other disorders resulting from impaired renal tubular function: Secondary | ICD-10-CM | POA: Diagnosis not present

## 2023-12-24 DIAGNOSIS — N186 End stage renal disease: Secondary | ICD-10-CM | POA: Diagnosis not present

## 2023-12-24 DIAGNOSIS — E878 Other disorders of electrolyte and fluid balance, not elsewhere classified: Secondary | ICD-10-CM | POA: Diagnosis not present

## 2023-12-24 DIAGNOSIS — E1122 Type 2 diabetes mellitus with diabetic chronic kidney disease: Secondary | ICD-10-CM | POA: Diagnosis not present

## 2023-12-24 DIAGNOSIS — E440 Moderate protein-calorie malnutrition: Secondary | ICD-10-CM | POA: Diagnosis not present

## 2023-12-24 DIAGNOSIS — Z992 Dependence on renal dialysis: Secondary | ICD-10-CM | POA: Diagnosis not present

## 2023-12-24 DIAGNOSIS — D509 Iron deficiency anemia, unspecified: Secondary | ICD-10-CM | POA: Diagnosis not present

## 2023-12-24 DIAGNOSIS — Z79899 Other long term (current) drug therapy: Secondary | ICD-10-CM | POA: Diagnosis not present

## 2023-12-25 DIAGNOSIS — E1122 Type 2 diabetes mellitus with diabetic chronic kidney disease: Secondary | ICD-10-CM | POA: Diagnosis not present

## 2023-12-25 DIAGNOSIS — Z4932 Encounter for adequacy testing for peritoneal dialysis: Secondary | ICD-10-CM | POA: Diagnosis not present

## 2023-12-25 DIAGNOSIS — E440 Moderate protein-calorie malnutrition: Secondary | ICD-10-CM | POA: Diagnosis not present

## 2023-12-25 DIAGNOSIS — N2581 Secondary hyperparathyroidism of renal origin: Secondary | ICD-10-CM | POA: Diagnosis not present

## 2023-12-25 DIAGNOSIS — K7689 Other specified diseases of liver: Secondary | ICD-10-CM | POA: Diagnosis not present

## 2023-12-25 DIAGNOSIS — N186 End stage renal disease: Secondary | ICD-10-CM | POA: Diagnosis not present

## 2023-12-25 DIAGNOSIS — D630 Anemia in neoplastic disease: Secondary | ICD-10-CM | POA: Diagnosis not present

## 2023-12-25 DIAGNOSIS — N2589 Other disorders resulting from impaired renal tubular function: Secondary | ICD-10-CM | POA: Diagnosis not present

## 2023-12-25 DIAGNOSIS — D509 Iron deficiency anemia, unspecified: Secondary | ICD-10-CM | POA: Diagnosis not present

## 2023-12-25 DIAGNOSIS — Z79899 Other long term (current) drug therapy: Secondary | ICD-10-CM | POA: Diagnosis not present

## 2023-12-25 DIAGNOSIS — E878 Other disorders of electrolyte and fluid balance, not elsewhere classified: Secondary | ICD-10-CM | POA: Diagnosis not present

## 2023-12-25 DIAGNOSIS — Z992 Dependence on renal dialysis: Secondary | ICD-10-CM | POA: Diagnosis not present

## 2023-12-26 DIAGNOSIS — Z992 Dependence on renal dialysis: Secondary | ICD-10-CM | POA: Diagnosis not present

## 2023-12-26 DIAGNOSIS — Z79899 Other long term (current) drug therapy: Secondary | ICD-10-CM | POA: Diagnosis not present

## 2023-12-26 DIAGNOSIS — E1122 Type 2 diabetes mellitus with diabetic chronic kidney disease: Secondary | ICD-10-CM | POA: Diagnosis not present

## 2023-12-26 DIAGNOSIS — N186 End stage renal disease: Secondary | ICD-10-CM | POA: Diagnosis not present

## 2023-12-26 DIAGNOSIS — D630 Anemia in neoplastic disease: Secondary | ICD-10-CM | POA: Diagnosis not present

## 2023-12-26 DIAGNOSIS — N2589 Other disorders resulting from impaired renal tubular function: Secondary | ICD-10-CM | POA: Diagnosis not present

## 2023-12-26 DIAGNOSIS — K7689 Other specified diseases of liver: Secondary | ICD-10-CM | POA: Diagnosis not present

## 2023-12-26 DIAGNOSIS — N2581 Secondary hyperparathyroidism of renal origin: Secondary | ICD-10-CM | POA: Diagnosis not present

## 2023-12-26 DIAGNOSIS — E878 Other disorders of electrolyte and fluid balance, not elsewhere classified: Secondary | ICD-10-CM | POA: Diagnosis not present

## 2023-12-26 DIAGNOSIS — D509 Iron deficiency anemia, unspecified: Secondary | ICD-10-CM | POA: Diagnosis not present

## 2023-12-26 DIAGNOSIS — Z4932 Encounter for adequacy testing for peritoneal dialysis: Secondary | ICD-10-CM | POA: Diagnosis not present

## 2023-12-26 DIAGNOSIS — E440 Moderate protein-calorie malnutrition: Secondary | ICD-10-CM | POA: Diagnosis not present

## 2023-12-27 DIAGNOSIS — Z992 Dependence on renal dialysis: Secondary | ICD-10-CM | POA: Diagnosis not present

## 2023-12-27 DIAGNOSIS — E1122 Type 2 diabetes mellitus with diabetic chronic kidney disease: Secondary | ICD-10-CM | POA: Diagnosis not present

## 2023-12-27 DIAGNOSIS — E878 Other disorders of electrolyte and fluid balance, not elsewhere classified: Secondary | ICD-10-CM | POA: Diagnosis not present

## 2023-12-27 DIAGNOSIS — N2581 Secondary hyperparathyroidism of renal origin: Secondary | ICD-10-CM | POA: Diagnosis not present

## 2023-12-27 DIAGNOSIS — Z79899 Other long term (current) drug therapy: Secondary | ICD-10-CM | POA: Diagnosis not present

## 2023-12-27 DIAGNOSIS — D509 Iron deficiency anemia, unspecified: Secondary | ICD-10-CM | POA: Diagnosis not present

## 2023-12-27 DIAGNOSIS — K7689 Other specified diseases of liver: Secondary | ICD-10-CM | POA: Diagnosis not present

## 2023-12-27 DIAGNOSIS — Z4932 Encounter for adequacy testing for peritoneal dialysis: Secondary | ICD-10-CM | POA: Diagnosis not present

## 2023-12-27 DIAGNOSIS — N186 End stage renal disease: Secondary | ICD-10-CM | POA: Diagnosis not present

## 2023-12-27 DIAGNOSIS — E440 Moderate protein-calorie malnutrition: Secondary | ICD-10-CM | POA: Diagnosis not present

## 2023-12-27 DIAGNOSIS — D630 Anemia in neoplastic disease: Secondary | ICD-10-CM | POA: Diagnosis not present

## 2023-12-27 DIAGNOSIS — N2589 Other disorders resulting from impaired renal tubular function: Secondary | ICD-10-CM | POA: Diagnosis not present

## 2023-12-28 DIAGNOSIS — D630 Anemia in neoplastic disease: Secondary | ICD-10-CM | POA: Diagnosis not present

## 2023-12-28 DIAGNOSIS — N186 End stage renal disease: Secondary | ICD-10-CM | POA: Diagnosis not present

## 2023-12-28 DIAGNOSIS — Z4932 Encounter for adequacy testing for peritoneal dialysis: Secondary | ICD-10-CM | POA: Diagnosis not present

## 2023-12-28 DIAGNOSIS — E440 Moderate protein-calorie malnutrition: Secondary | ICD-10-CM | POA: Diagnosis not present

## 2023-12-28 DIAGNOSIS — E1122 Type 2 diabetes mellitus with diabetic chronic kidney disease: Secondary | ICD-10-CM | POA: Diagnosis not present

## 2023-12-28 DIAGNOSIS — Z992 Dependence on renal dialysis: Secondary | ICD-10-CM | POA: Diagnosis not present

## 2023-12-28 DIAGNOSIS — Z79899 Other long term (current) drug therapy: Secondary | ICD-10-CM | POA: Diagnosis not present

## 2023-12-28 DIAGNOSIS — K7689 Other specified diseases of liver: Secondary | ICD-10-CM | POA: Diagnosis not present

## 2023-12-28 DIAGNOSIS — D509 Iron deficiency anemia, unspecified: Secondary | ICD-10-CM | POA: Diagnosis not present

## 2023-12-28 DIAGNOSIS — N2589 Other disorders resulting from impaired renal tubular function: Secondary | ICD-10-CM | POA: Diagnosis not present

## 2023-12-28 DIAGNOSIS — E878 Other disorders of electrolyte and fluid balance, not elsewhere classified: Secondary | ICD-10-CM | POA: Diagnosis not present

## 2023-12-28 DIAGNOSIS — N2581 Secondary hyperparathyroidism of renal origin: Secondary | ICD-10-CM | POA: Diagnosis not present

## 2023-12-29 ENCOUNTER — Encounter (HOSPITAL_BASED_OUTPATIENT_CLINIC_OR_DEPARTMENT_OTHER): Payer: Self-pay

## 2023-12-29 DIAGNOSIS — E1122 Type 2 diabetes mellitus with diabetic chronic kidney disease: Secondary | ICD-10-CM | POA: Diagnosis not present

## 2023-12-29 DIAGNOSIS — E878 Other disorders of electrolyte and fluid balance, not elsewhere classified: Secondary | ICD-10-CM | POA: Diagnosis not present

## 2023-12-29 DIAGNOSIS — N186 End stage renal disease: Secondary | ICD-10-CM | POA: Diagnosis not present

## 2023-12-29 DIAGNOSIS — Z992 Dependence on renal dialysis: Secondary | ICD-10-CM | POA: Diagnosis not present

## 2023-12-29 DIAGNOSIS — E440 Moderate protein-calorie malnutrition: Secondary | ICD-10-CM | POA: Diagnosis not present

## 2023-12-29 DIAGNOSIS — D509 Iron deficiency anemia, unspecified: Secondary | ICD-10-CM | POA: Diagnosis not present

## 2023-12-29 DIAGNOSIS — N2589 Other disorders resulting from impaired renal tubular function: Secondary | ICD-10-CM | POA: Diagnosis not present

## 2023-12-29 DIAGNOSIS — K7689 Other specified diseases of liver: Secondary | ICD-10-CM | POA: Diagnosis not present

## 2023-12-29 DIAGNOSIS — Z79899 Other long term (current) drug therapy: Secondary | ICD-10-CM | POA: Diagnosis not present

## 2023-12-29 DIAGNOSIS — N2581 Secondary hyperparathyroidism of renal origin: Secondary | ICD-10-CM | POA: Diagnosis not present

## 2023-12-29 DIAGNOSIS — D630 Anemia in neoplastic disease: Secondary | ICD-10-CM | POA: Diagnosis not present

## 2023-12-29 DIAGNOSIS — Z4932 Encounter for adequacy testing for peritoneal dialysis: Secondary | ICD-10-CM | POA: Diagnosis not present

## 2023-12-30 ENCOUNTER — Encounter (HOSPITAL_BASED_OUTPATIENT_CLINIC_OR_DEPARTMENT_OTHER): Payer: Self-pay | Admitting: Pulmonary Disease

## 2023-12-30 ENCOUNTER — Ambulatory Visit (HOSPITAL_BASED_OUTPATIENT_CLINIC_OR_DEPARTMENT_OTHER): Payer: Medicare Other | Admitting: Pulmonary Disease

## 2023-12-30 VITALS — BP 128/58 | HR 91 | Resp 16 | Ht 69.0 in | Wt 174.0 lb

## 2023-12-30 DIAGNOSIS — E1122 Type 2 diabetes mellitus with diabetic chronic kidney disease: Secondary | ICD-10-CM | POA: Diagnosis not present

## 2023-12-30 DIAGNOSIS — Z79899 Other long term (current) drug therapy: Secondary | ICD-10-CM | POA: Diagnosis not present

## 2023-12-30 DIAGNOSIS — E440 Moderate protein-calorie malnutrition: Secondary | ICD-10-CM | POA: Diagnosis not present

## 2023-12-30 DIAGNOSIS — E878 Other disorders of electrolyte and fluid balance, not elsewhere classified: Secondary | ICD-10-CM | POA: Diagnosis not present

## 2023-12-30 DIAGNOSIS — Z992 Dependence on renal dialysis: Secondary | ICD-10-CM | POA: Diagnosis not present

## 2023-12-30 DIAGNOSIS — N2589 Other disorders resulting from impaired renal tubular function: Secondary | ICD-10-CM | POA: Diagnosis not present

## 2023-12-30 DIAGNOSIS — N2581 Secondary hyperparathyroidism of renal origin: Secondary | ICD-10-CM | POA: Diagnosis not present

## 2023-12-30 DIAGNOSIS — G4733 Obstructive sleep apnea (adult) (pediatric): Secondary | ICD-10-CM

## 2023-12-30 DIAGNOSIS — N186 End stage renal disease: Secondary | ICD-10-CM | POA: Diagnosis not present

## 2023-12-30 DIAGNOSIS — Z4932 Encounter for adequacy testing for peritoneal dialysis: Secondary | ICD-10-CM | POA: Diagnosis not present

## 2023-12-30 DIAGNOSIS — K7689 Other specified diseases of liver: Secondary | ICD-10-CM | POA: Diagnosis not present

## 2023-12-30 DIAGNOSIS — D630 Anemia in neoplastic disease: Secondary | ICD-10-CM | POA: Diagnosis not present

## 2023-12-30 DIAGNOSIS — D509 Iron deficiency anemia, unspecified: Secondary | ICD-10-CM | POA: Diagnosis not present

## 2023-12-30 DIAGNOSIS — J9611 Chronic respiratory failure with hypoxia: Secondary | ICD-10-CM

## 2023-12-30 NOTE — Patient Instructions (Addendum)
 X check nocturnal oximetry on BiPAP/room air

## 2023-12-30 NOTE — Assessment & Plan Note (Addendum)
 events seem to be well-controlled on nocturnal BiPAP He is very compliant and this is only helped improve his daytime somnolence and fatigue We will recheck nocturnal oximetry on BiPAP/room air.  He will continue oxygen  until then  compliance with goal of at least 4-6 hrs every night is the expectation. Advised against medications with sedative side effects Cautioned against driving when sleepy - understanding that sleepiness will vary on a day to day basis

## 2023-12-30 NOTE — Progress Notes (Signed)
   Subjective:    Patient ID: Russell Bowers, male    DOB: [birth year], 73 y.o.   MRN: [id number]  HPI  73  yo ex-smoker, renal transplant recipient for FU of  mod OSA, diaphragmatic paralysis  -mild bronchomalacia on bronchoscopy 01/2023   He had an episode of hypercarbic respiratory failure in 2014 At that time he was diagnosed with diaphragmatic paralysis and moderate OSA and is maintained on nocturnal BiPAP since then, settings changed to 10/5 He quit smoking 1994 (20-pack-year)   Serial nocturnal oximetry  showed oxygen  desaturations but he  quit using oxygen   in 2017   PMH - renal transplant Prostate CA sp radical prostatectomy in 2011  AS PAF   hospitalized 1/23 to 01/30/2023 for hypoxia and COVID-pneumonia, on HD since  12-month follow-up   He denies any problems with mask or pressure.  He is compliant with his machine.  He would really like to discontinue oxygen .  We reviewed nocturnal oximetry from 2024 he denies significant dyspnea in the daytime He is now on home peritoneal dialysis, is still considering transplant  BiPAP download was reviewed which shows excellent compliance more than 8.5 hours per night, no residual events and minimal leak average BiPAP settings are 10/6.  He is very compliant and BiPAP is only helped improve his daytime somnolence and fatigue  Significant tests/ events reviewed   02/2023 ONO on bipap/RA showed desat x 2 h -he was asked to continue oxygen  blended into his CPAP machine.    Polysomnogram at [facility] in 02/2012 showed moderate obstructive sleep apnea with AHI of 25 events per hour, lowest desaturation of 71%. Unfortunately he did not tolerate CPAP on a subsequent titration study.   -admitted 03/2013 with acute hypercarbic resp failure in setting H Flu HCAP (similar recent admit February at [facility]) and likely decompensated OSA. Required mechanical ventilation Course c/b acute on chronic renal insufficiency, poor glycemic control and back  abscess with hx peptostreptococcus.    PFTs 02/2014  showed severe restriction with a ratio 77, FVC 1.35-32%, FEV1 1.04-32% and TLC of 3.0 L-46% with DLCO at 52% that corrects for alveolar volumes suggestive of extra parenchymal restriction. Sniff test showed minimal mobility of diaphragms.    Nocturnal oximetry showed severe desaturation on 2 L of oxygen  with a pattern suggestive hypoventilation, he spent 393 minutes with sats less than 88%.    BiPAP titration- 218 pounds- 13/9 cm with a medium fullface mask with 2 liters of oxygen  blended  >>04/2014 could not tolerate high pressure, hence changed to autobipap, finally decreased to 10/5 cm  Review of Systems neg for any significant sore throat, dysphagia, itching, sneezing, nasal congestion or excess/ purulent secretions, fever, chills, sweats, unintended wt loss, pleuritic or exertional cp, hempoptysis, orthopnea pnd or change in chronic leg swelling. Also denies presyncope, palpitations, heartburn, abdominal pain, nausea, vomiting, diarrhea or change in bowel or urinary habits, dysuria,hematuria, rash, arthralgias, visual complaints, headache, numbness weakness or ataxia.     Objective:   Physical Exam  Gen. Pleasant, obese, in no distress ENT - no lesions, no post nasal drip Neck: No JVD, no thyromegaly, no carotid bruits Lungs: no use of accessory muscles, no dullness to percussion, decreased without rales or rhonchi  Cardiovascular: Rhythm regular, heart sounds  normal, no murmurs or gallops, no peripheral edema Musculoskeletal: No deformities, no cyanosis or clubbing , no tremors       Assessment & Plan:

## 2023-12-31 DIAGNOSIS — E1122 Type 2 diabetes mellitus with diabetic chronic kidney disease: Secondary | ICD-10-CM | POA: Diagnosis not present

## 2023-12-31 DIAGNOSIS — A419 Sepsis, unspecified organism: Secondary | ICD-10-CM | POA: Diagnosis not present

## 2023-12-31 DIAGNOSIS — R6521 Severe sepsis with septic shock: Secondary | ICD-10-CM | POA: Diagnosis not present

## 2023-12-31 DIAGNOSIS — N2581 Secondary hyperparathyroidism of renal origin: Secondary | ICD-10-CM | POA: Diagnosis not present

## 2023-12-31 DIAGNOSIS — K7689 Other specified diseases of liver: Secondary | ICD-10-CM | POA: Diagnosis not present

## 2023-12-31 DIAGNOSIS — D509 Iron deficiency anemia, unspecified: Secondary | ICD-10-CM | POA: Diagnosis not present

## 2023-12-31 DIAGNOSIS — N2589 Other disorders resulting from impaired renal tubular function: Secondary | ICD-10-CM | POA: Diagnosis not present

## 2023-12-31 DIAGNOSIS — D630 Anemia in neoplastic disease: Secondary | ICD-10-CM | POA: Diagnosis not present

## 2023-12-31 DIAGNOSIS — N186 End stage renal disease: Secondary | ICD-10-CM | POA: Diagnosis not present

## 2023-12-31 DIAGNOSIS — Z992 Dependence on renal dialysis: Secondary | ICD-10-CM | POA: Diagnosis not present

## 2023-12-31 DIAGNOSIS — E878 Other disorders of electrolyte and fluid balance, not elsewhere classified: Secondary | ICD-10-CM | POA: Diagnosis not present

## 2023-12-31 DIAGNOSIS — Z4932 Encounter for adequacy testing for peritoneal dialysis: Secondary | ICD-10-CM | POA: Diagnosis not present

## 2023-12-31 DIAGNOSIS — I4891 Unspecified atrial fibrillation: Secondary | ICD-10-CM | POA: Diagnosis not present

## 2023-12-31 DIAGNOSIS — E440 Moderate protein-calorie malnutrition: Secondary | ICD-10-CM | POA: Diagnosis not present

## 2023-12-31 DIAGNOSIS — Z79899 Other long term (current) drug therapy: Secondary | ICD-10-CM | POA: Diagnosis not present

## 2024-01-01 DIAGNOSIS — N2581 Secondary hyperparathyroidism of renal origin: Secondary | ICD-10-CM | POA: Diagnosis not present

## 2024-01-01 DIAGNOSIS — Z4932 Encounter for adequacy testing for peritoneal dialysis: Secondary | ICD-10-CM | POA: Diagnosis not present

## 2024-01-01 DIAGNOSIS — E878 Other disorders of electrolyte and fluid balance, not elsewhere classified: Secondary | ICD-10-CM | POA: Diagnosis not present

## 2024-01-01 DIAGNOSIS — Z992 Dependence on renal dialysis: Secondary | ICD-10-CM | POA: Diagnosis not present

## 2024-01-01 DIAGNOSIS — N2589 Other disorders resulting from impaired renal tubular function: Secondary | ICD-10-CM | POA: Diagnosis not present

## 2024-01-01 DIAGNOSIS — D509 Iron deficiency anemia, unspecified: Secondary | ICD-10-CM | POA: Diagnosis not present

## 2024-01-01 DIAGNOSIS — Z79899 Other long term (current) drug therapy: Secondary | ICD-10-CM | POA: Diagnosis not present

## 2024-01-01 DIAGNOSIS — N186 End stage renal disease: Secondary | ICD-10-CM | POA: Diagnosis not present

## 2024-01-01 DIAGNOSIS — E1122 Type 2 diabetes mellitus with diabetic chronic kidney disease: Secondary | ICD-10-CM | POA: Diagnosis not present

## 2024-01-01 DIAGNOSIS — D630 Anemia in neoplastic disease: Secondary | ICD-10-CM | POA: Diagnosis not present

## 2024-01-01 DIAGNOSIS — K7689 Other specified diseases of liver: Secondary | ICD-10-CM | POA: Diagnosis not present

## 2024-01-01 DIAGNOSIS — E440 Moderate protein-calorie malnutrition: Secondary | ICD-10-CM | POA: Diagnosis not present

## 2024-01-02 DIAGNOSIS — D630 Anemia in neoplastic disease: Secondary | ICD-10-CM | POA: Diagnosis not present

## 2024-01-02 DIAGNOSIS — Z4932 Encounter for adequacy testing for peritoneal dialysis: Secondary | ICD-10-CM | POA: Diagnosis not present

## 2024-01-02 DIAGNOSIS — K7689 Other specified diseases of liver: Secondary | ICD-10-CM | POA: Diagnosis not present

## 2024-01-02 DIAGNOSIS — E878 Other disorders of electrolyte and fluid balance, not elsewhere classified: Secondary | ICD-10-CM | POA: Diagnosis not present

## 2024-01-02 DIAGNOSIS — Z992 Dependence on renal dialysis: Secondary | ICD-10-CM | POA: Diagnosis not present

## 2024-01-02 DIAGNOSIS — E440 Moderate protein-calorie malnutrition: Secondary | ICD-10-CM | POA: Diagnosis not present

## 2024-01-02 DIAGNOSIS — N2589 Other disorders resulting from impaired renal tubular function: Secondary | ICD-10-CM | POA: Diagnosis not present

## 2024-01-02 DIAGNOSIS — N2581 Secondary hyperparathyroidism of renal origin: Secondary | ICD-10-CM | POA: Diagnosis not present

## 2024-01-02 DIAGNOSIS — D509 Iron deficiency anemia, unspecified: Secondary | ICD-10-CM | POA: Diagnosis not present

## 2024-01-02 DIAGNOSIS — N186 End stage renal disease: Secondary | ICD-10-CM | POA: Diagnosis not present

## 2024-01-02 DIAGNOSIS — Z79899 Other long term (current) drug therapy: Secondary | ICD-10-CM | POA: Diagnosis not present

## 2024-01-02 DIAGNOSIS — E1122 Type 2 diabetes mellitus with diabetic chronic kidney disease: Secondary | ICD-10-CM | POA: Diagnosis not present

## 2024-01-03 DIAGNOSIS — N2581 Secondary hyperparathyroidism of renal origin: Secondary | ICD-10-CM | POA: Diagnosis not present

## 2024-01-03 DIAGNOSIS — N186 End stage renal disease: Secondary | ICD-10-CM | POA: Diagnosis not present

## 2024-01-03 DIAGNOSIS — D630 Anemia in neoplastic disease: Secondary | ICD-10-CM | POA: Diagnosis not present

## 2024-01-03 DIAGNOSIS — K7689 Other specified diseases of liver: Secondary | ICD-10-CM | POA: Diagnosis not present

## 2024-01-03 DIAGNOSIS — E878 Other disorders of electrolyte and fluid balance, not elsewhere classified: Secondary | ICD-10-CM | POA: Diagnosis not present

## 2024-01-03 DIAGNOSIS — Z992 Dependence on renal dialysis: Secondary | ICD-10-CM | POA: Diagnosis not present

## 2024-01-03 DIAGNOSIS — D509 Iron deficiency anemia, unspecified: Secondary | ICD-10-CM | POA: Diagnosis not present

## 2024-01-03 DIAGNOSIS — N2589 Other disorders resulting from impaired renal tubular function: Secondary | ICD-10-CM | POA: Diagnosis not present

## 2024-01-03 DIAGNOSIS — Z79899 Other long term (current) drug therapy: Secondary | ICD-10-CM | POA: Diagnosis not present

## 2024-01-03 DIAGNOSIS — E1122 Type 2 diabetes mellitus with diabetic chronic kidney disease: Secondary | ICD-10-CM | POA: Diagnosis not present

## 2024-01-03 DIAGNOSIS — E440 Moderate protein-calorie malnutrition: Secondary | ICD-10-CM | POA: Diagnosis not present

## 2024-01-03 DIAGNOSIS — Z4932 Encounter for adequacy testing for peritoneal dialysis: Secondary | ICD-10-CM | POA: Diagnosis not present

## 2024-01-04 DIAGNOSIS — E440 Moderate protein-calorie malnutrition: Secondary | ICD-10-CM | POA: Diagnosis not present

## 2024-01-04 DIAGNOSIS — N186 End stage renal disease: Secondary | ICD-10-CM | POA: Diagnosis not present

## 2024-01-04 DIAGNOSIS — E878 Other disorders of electrolyte and fluid balance, not elsewhere classified: Secondary | ICD-10-CM | POA: Diagnosis not present

## 2024-01-04 DIAGNOSIS — Z4932 Encounter for adequacy testing for peritoneal dialysis: Secondary | ICD-10-CM | POA: Diagnosis not present

## 2024-01-04 DIAGNOSIS — N2589 Other disorders resulting from impaired renal tubular function: Secondary | ICD-10-CM | POA: Diagnosis not present

## 2024-01-04 DIAGNOSIS — N2581 Secondary hyperparathyroidism of renal origin: Secondary | ICD-10-CM | POA: Diagnosis not present

## 2024-01-04 DIAGNOSIS — E1122 Type 2 diabetes mellitus with diabetic chronic kidney disease: Secondary | ICD-10-CM | POA: Diagnosis not present

## 2024-01-04 DIAGNOSIS — K7689 Other specified diseases of liver: Secondary | ICD-10-CM | POA: Diagnosis not present

## 2024-01-04 DIAGNOSIS — Z79899 Other long term (current) drug therapy: Secondary | ICD-10-CM | POA: Diagnosis not present

## 2024-01-04 DIAGNOSIS — D630 Anemia in neoplastic disease: Secondary | ICD-10-CM | POA: Diagnosis not present

## 2024-01-04 DIAGNOSIS — D509 Iron deficiency anemia, unspecified: Secondary | ICD-10-CM | POA: Diagnosis not present

## 2024-01-04 DIAGNOSIS — Z992 Dependence on renal dialysis: Secondary | ICD-10-CM | POA: Diagnosis not present

## 2024-01-05 DIAGNOSIS — E878 Other disorders of electrolyte and fluid balance, not elsewhere classified: Secondary | ICD-10-CM | POA: Diagnosis not present

## 2024-01-05 DIAGNOSIS — E440 Moderate protein-calorie malnutrition: Secondary | ICD-10-CM | POA: Diagnosis not present

## 2024-01-05 DIAGNOSIS — E1122 Type 2 diabetes mellitus with diabetic chronic kidney disease: Secondary | ICD-10-CM | POA: Diagnosis not present

## 2024-01-05 DIAGNOSIS — Z79899 Other long term (current) drug therapy: Secondary | ICD-10-CM | POA: Diagnosis not present

## 2024-01-05 DIAGNOSIS — D630 Anemia in neoplastic disease: Secondary | ICD-10-CM | POA: Diagnosis not present

## 2024-01-05 DIAGNOSIS — N2581 Secondary hyperparathyroidism of renal origin: Secondary | ICD-10-CM | POA: Diagnosis not present

## 2024-01-05 DIAGNOSIS — Z992 Dependence on renal dialysis: Secondary | ICD-10-CM | POA: Diagnosis not present

## 2024-01-05 DIAGNOSIS — N186 End stage renal disease: Secondary | ICD-10-CM | POA: Diagnosis not present

## 2024-01-05 DIAGNOSIS — K7689 Other specified diseases of liver: Secondary | ICD-10-CM | POA: Diagnosis not present

## 2024-01-05 DIAGNOSIS — D509 Iron deficiency anemia, unspecified: Secondary | ICD-10-CM | POA: Diagnosis not present

## 2024-01-05 DIAGNOSIS — N2589 Other disorders resulting from impaired renal tubular function: Secondary | ICD-10-CM | POA: Diagnosis not present

## 2024-01-05 DIAGNOSIS — Z4932 Encounter for adequacy testing for peritoneal dialysis: Secondary | ICD-10-CM | POA: Diagnosis not present

## 2024-01-06 DIAGNOSIS — N2581 Secondary hyperparathyroidism of renal origin: Secondary | ICD-10-CM | POA: Diagnosis not present

## 2024-01-06 DIAGNOSIS — Z992 Dependence on renal dialysis: Secondary | ICD-10-CM | POA: Diagnosis not present

## 2024-01-06 DIAGNOSIS — Z79899 Other long term (current) drug therapy: Secondary | ICD-10-CM | POA: Diagnosis not present

## 2024-01-06 DIAGNOSIS — Z4932 Encounter for adequacy testing for peritoneal dialysis: Secondary | ICD-10-CM | POA: Diagnosis not present

## 2024-01-06 DIAGNOSIS — E1122 Type 2 diabetes mellitus with diabetic chronic kidney disease: Secondary | ICD-10-CM | POA: Diagnosis not present

## 2024-01-06 DIAGNOSIS — N2589 Other disorders resulting from impaired renal tubular function: Secondary | ICD-10-CM | POA: Diagnosis not present

## 2024-01-06 DIAGNOSIS — N186 End stage renal disease: Secondary | ICD-10-CM | POA: Diagnosis not present

## 2024-01-06 DIAGNOSIS — K7689 Other specified diseases of liver: Secondary | ICD-10-CM | POA: Diagnosis not present

## 2024-01-06 DIAGNOSIS — E440 Moderate protein-calorie malnutrition: Secondary | ICD-10-CM | POA: Diagnosis not present

## 2024-01-06 DIAGNOSIS — D509 Iron deficiency anemia, unspecified: Secondary | ICD-10-CM | POA: Diagnosis not present

## 2024-01-06 DIAGNOSIS — E878 Other disorders of electrolyte and fluid balance, not elsewhere classified: Secondary | ICD-10-CM | POA: Diagnosis not present

## 2024-01-06 DIAGNOSIS — D630 Anemia in neoplastic disease: Secondary | ICD-10-CM | POA: Diagnosis not present

## 2024-01-07 DIAGNOSIS — E1122 Type 2 diabetes mellitus with diabetic chronic kidney disease: Secondary | ICD-10-CM | POA: Diagnosis not present

## 2024-01-07 DIAGNOSIS — N186 End stage renal disease: Secondary | ICD-10-CM | POA: Diagnosis not present

## 2024-01-07 DIAGNOSIS — Z992 Dependence on renal dialysis: Secondary | ICD-10-CM | POA: Diagnosis not present

## 2024-01-07 DIAGNOSIS — K7689 Other specified diseases of liver: Secondary | ICD-10-CM | POA: Diagnosis not present

## 2024-01-07 DIAGNOSIS — D509 Iron deficiency anemia, unspecified: Secondary | ICD-10-CM | POA: Diagnosis not present

## 2024-01-07 DIAGNOSIS — N2581 Secondary hyperparathyroidism of renal origin: Secondary | ICD-10-CM | POA: Diagnosis not present

## 2024-01-07 DIAGNOSIS — Z79899 Other long term (current) drug therapy: Secondary | ICD-10-CM | POA: Diagnosis not present

## 2024-01-07 DIAGNOSIS — E440 Moderate protein-calorie malnutrition: Secondary | ICD-10-CM | POA: Diagnosis not present

## 2024-01-07 DIAGNOSIS — D630 Anemia in neoplastic disease: Secondary | ICD-10-CM | POA: Diagnosis not present

## 2024-01-07 DIAGNOSIS — N2589 Other disorders resulting from impaired renal tubular function: Secondary | ICD-10-CM | POA: Diagnosis not present

## 2024-01-07 DIAGNOSIS — Z4932 Encounter for adequacy testing for peritoneal dialysis: Secondary | ICD-10-CM | POA: Diagnosis not present

## 2024-01-07 DIAGNOSIS — E878 Other disorders of electrolyte and fluid balance, not elsewhere classified: Secondary | ICD-10-CM | POA: Diagnosis not present

## 2024-01-08 DIAGNOSIS — N2581 Secondary hyperparathyroidism of renal origin: Secondary | ICD-10-CM | POA: Diagnosis not present

## 2024-01-08 DIAGNOSIS — G4730 Sleep apnea, unspecified: Secondary | ICD-10-CM | POA: Diagnosis not present

## 2024-01-08 DIAGNOSIS — E878 Other disorders of electrolyte and fluid balance, not elsewhere classified: Secondary | ICD-10-CM | POA: Diagnosis not present

## 2024-01-08 DIAGNOSIS — Z992 Dependence on renal dialysis: Secondary | ICD-10-CM | POA: Diagnosis not present

## 2024-01-08 DIAGNOSIS — N186 End stage renal disease: Secondary | ICD-10-CM | POA: Diagnosis not present

## 2024-01-08 DIAGNOSIS — D509 Iron deficiency anemia, unspecified: Secondary | ICD-10-CM | POA: Diagnosis not present

## 2024-01-08 DIAGNOSIS — Z79899 Other long term (current) drug therapy: Secondary | ICD-10-CM | POA: Diagnosis not present

## 2024-01-08 DIAGNOSIS — E162 Hypoglycemia, unspecified: Secondary | ICD-10-CM | POA: Diagnosis not present

## 2024-01-08 DIAGNOSIS — D630 Anemia in neoplastic disease: Secondary | ICD-10-CM | POA: Diagnosis not present

## 2024-01-08 DIAGNOSIS — Z794 Long term (current) use of insulin: Secondary | ICD-10-CM | POA: Diagnosis not present

## 2024-01-08 DIAGNOSIS — E1122 Type 2 diabetes mellitus with diabetic chronic kidney disease: Secondary | ICD-10-CM | POA: Diagnosis not present

## 2024-01-08 DIAGNOSIS — Z4932 Encounter for adequacy testing for peritoneal dialysis: Secondary | ICD-10-CM | POA: Diagnosis not present

## 2024-01-08 DIAGNOSIS — I4891 Unspecified atrial fibrillation: Secondary | ICD-10-CM | POA: Diagnosis not present

## 2024-01-08 DIAGNOSIS — N2589 Other disorders resulting from impaired renal tubular function: Secondary | ICD-10-CM | POA: Diagnosis not present

## 2024-01-08 DIAGNOSIS — E440 Moderate protein-calorie malnutrition: Secondary | ICD-10-CM | POA: Diagnosis not present

## 2024-01-08 DIAGNOSIS — R0902 Hypoxemia: Secondary | ICD-10-CM | POA: Diagnosis not present

## 2024-01-08 DIAGNOSIS — K7689 Other specified diseases of liver: Secondary | ICD-10-CM | POA: Diagnosis not present

## 2024-01-09 DIAGNOSIS — Z992 Dependence on renal dialysis: Secondary | ICD-10-CM | POA: Diagnosis not present

## 2024-01-09 DIAGNOSIS — D630 Anemia in neoplastic disease: Secondary | ICD-10-CM | POA: Diagnosis not present

## 2024-01-09 DIAGNOSIS — K7689 Other specified diseases of liver: Secondary | ICD-10-CM | POA: Diagnosis not present

## 2024-01-09 DIAGNOSIS — N2581 Secondary hyperparathyroidism of renal origin: Secondary | ICD-10-CM | POA: Diagnosis not present

## 2024-01-09 DIAGNOSIS — Z79899 Other long term (current) drug therapy: Secondary | ICD-10-CM | POA: Diagnosis not present

## 2024-01-09 DIAGNOSIS — Z4932 Encounter for adequacy testing for peritoneal dialysis: Secondary | ICD-10-CM | POA: Diagnosis not present

## 2024-01-09 DIAGNOSIS — D509 Iron deficiency anemia, unspecified: Secondary | ICD-10-CM | POA: Diagnosis not present

## 2024-01-09 DIAGNOSIS — E1122 Type 2 diabetes mellitus with diabetic chronic kidney disease: Secondary | ICD-10-CM | POA: Diagnosis not present

## 2024-01-09 DIAGNOSIS — N2589 Other disorders resulting from impaired renal tubular function: Secondary | ICD-10-CM | POA: Diagnosis not present

## 2024-01-09 DIAGNOSIS — E878 Other disorders of electrolyte and fluid balance, not elsewhere classified: Secondary | ICD-10-CM | POA: Diagnosis not present

## 2024-01-09 DIAGNOSIS — E440 Moderate protein-calorie malnutrition: Secondary | ICD-10-CM | POA: Diagnosis not present

## 2024-01-09 DIAGNOSIS — N186 End stage renal disease: Secondary | ICD-10-CM | POA: Diagnosis not present

## 2024-01-10 DIAGNOSIS — E878 Other disorders of electrolyte and fluid balance, not elsewhere classified: Secondary | ICD-10-CM | POA: Diagnosis not present

## 2024-01-10 DIAGNOSIS — Z79899 Other long term (current) drug therapy: Secondary | ICD-10-CM | POA: Diagnosis not present

## 2024-01-10 DIAGNOSIS — Z4932 Encounter for adequacy testing for peritoneal dialysis: Secondary | ICD-10-CM | POA: Diagnosis not present

## 2024-01-10 DIAGNOSIS — N2589 Other disorders resulting from impaired renal tubular function: Secondary | ICD-10-CM | POA: Diagnosis not present

## 2024-01-10 DIAGNOSIS — N186 End stage renal disease: Secondary | ICD-10-CM | POA: Diagnosis not present

## 2024-01-10 DIAGNOSIS — E440 Moderate protein-calorie malnutrition: Secondary | ICD-10-CM | POA: Diagnosis not present

## 2024-01-10 DIAGNOSIS — D630 Anemia in neoplastic disease: Secondary | ICD-10-CM | POA: Diagnosis not present

## 2024-01-10 DIAGNOSIS — E1122 Type 2 diabetes mellitus with diabetic chronic kidney disease: Secondary | ICD-10-CM | POA: Diagnosis not present

## 2024-01-10 DIAGNOSIS — K7689 Other specified diseases of liver: Secondary | ICD-10-CM | POA: Diagnosis not present

## 2024-01-10 DIAGNOSIS — D509 Iron deficiency anemia, unspecified: Secondary | ICD-10-CM | POA: Diagnosis not present

## 2024-01-10 DIAGNOSIS — N2581 Secondary hyperparathyroidism of renal origin: Secondary | ICD-10-CM | POA: Diagnosis not present

## 2024-01-10 DIAGNOSIS — Z992 Dependence on renal dialysis: Secondary | ICD-10-CM | POA: Diagnosis not present

## 2024-01-11 DIAGNOSIS — Z992 Dependence on renal dialysis: Secondary | ICD-10-CM | POA: Diagnosis not present

## 2024-01-11 DIAGNOSIS — E440 Moderate protein-calorie malnutrition: Secondary | ICD-10-CM | POA: Diagnosis not present

## 2024-01-11 DIAGNOSIS — E878 Other disorders of electrolyte and fluid balance, not elsewhere classified: Secondary | ICD-10-CM | POA: Diagnosis not present

## 2024-01-11 DIAGNOSIS — N2589 Other disorders resulting from impaired renal tubular function: Secondary | ICD-10-CM | POA: Diagnosis not present

## 2024-01-11 DIAGNOSIS — D630 Anemia in neoplastic disease: Secondary | ICD-10-CM | POA: Diagnosis not present

## 2024-01-11 DIAGNOSIS — D509 Iron deficiency anemia, unspecified: Secondary | ICD-10-CM | POA: Diagnosis not present

## 2024-01-11 DIAGNOSIS — E1122 Type 2 diabetes mellitus with diabetic chronic kidney disease: Secondary | ICD-10-CM | POA: Diagnosis not present

## 2024-01-11 DIAGNOSIS — Z79899 Other long term (current) drug therapy: Secondary | ICD-10-CM | POA: Diagnosis not present

## 2024-01-11 DIAGNOSIS — K7689 Other specified diseases of liver: Secondary | ICD-10-CM | POA: Diagnosis not present

## 2024-01-11 DIAGNOSIS — N186 End stage renal disease: Secondary | ICD-10-CM | POA: Diagnosis not present

## 2024-01-11 DIAGNOSIS — Z4932 Encounter for adequacy testing for peritoneal dialysis: Secondary | ICD-10-CM | POA: Diagnosis not present

## 2024-01-11 DIAGNOSIS — N2581 Secondary hyperparathyroidism of renal origin: Secondary | ICD-10-CM | POA: Diagnosis not present

## 2024-01-12 DIAGNOSIS — E440 Moderate protein-calorie malnutrition: Secondary | ICD-10-CM | POA: Diagnosis not present

## 2024-01-12 DIAGNOSIS — D509 Iron deficiency anemia, unspecified: Secondary | ICD-10-CM | POA: Diagnosis not present

## 2024-01-12 DIAGNOSIS — N186 End stage renal disease: Secondary | ICD-10-CM | POA: Diagnosis not present

## 2024-01-12 DIAGNOSIS — Z992 Dependence on renal dialysis: Secondary | ICD-10-CM | POA: Diagnosis not present

## 2024-01-12 DIAGNOSIS — K7689 Other specified diseases of liver: Secondary | ICD-10-CM | POA: Diagnosis not present

## 2024-01-12 DIAGNOSIS — Z79899 Other long term (current) drug therapy: Secondary | ICD-10-CM | POA: Diagnosis not present

## 2024-01-12 DIAGNOSIS — N2581 Secondary hyperparathyroidism of renal origin: Secondary | ICD-10-CM | POA: Diagnosis not present

## 2024-01-12 DIAGNOSIS — E878 Other disorders of electrolyte and fluid balance, not elsewhere classified: Secondary | ICD-10-CM | POA: Diagnosis not present

## 2024-01-12 DIAGNOSIS — N2589 Other disorders resulting from impaired renal tubular function: Secondary | ICD-10-CM | POA: Diagnosis not present

## 2024-01-12 DIAGNOSIS — D630 Anemia in neoplastic disease: Secondary | ICD-10-CM | POA: Diagnosis not present

## 2024-01-12 DIAGNOSIS — Z4932 Encounter for adequacy testing for peritoneal dialysis: Secondary | ICD-10-CM | POA: Diagnosis not present

## 2024-01-12 DIAGNOSIS — E1122 Type 2 diabetes mellitus with diabetic chronic kidney disease: Secondary | ICD-10-CM | POA: Diagnosis not present

## 2024-01-13 DIAGNOSIS — Z992 Dependence on renal dialysis: Secondary | ICD-10-CM | POA: Diagnosis not present

## 2024-01-13 DIAGNOSIS — K7689 Other specified diseases of liver: Secondary | ICD-10-CM | POA: Diagnosis not present

## 2024-01-13 DIAGNOSIS — Z79899 Other long term (current) drug therapy: Secondary | ICD-10-CM | POA: Diagnosis not present

## 2024-01-13 DIAGNOSIS — E1122 Type 2 diabetes mellitus with diabetic chronic kidney disease: Secondary | ICD-10-CM | POA: Diagnosis not present

## 2024-01-13 DIAGNOSIS — N2589 Other disorders resulting from impaired renal tubular function: Secondary | ICD-10-CM | POA: Diagnosis not present

## 2024-01-13 DIAGNOSIS — D630 Anemia in neoplastic disease: Secondary | ICD-10-CM | POA: Diagnosis not present

## 2024-01-13 DIAGNOSIS — D509 Iron deficiency anemia, unspecified: Secondary | ICD-10-CM | POA: Diagnosis not present

## 2024-01-13 DIAGNOSIS — Z4932 Encounter for adequacy testing for peritoneal dialysis: Secondary | ICD-10-CM | POA: Diagnosis not present

## 2024-01-13 DIAGNOSIS — E878 Other disorders of electrolyte and fluid balance, not elsewhere classified: Secondary | ICD-10-CM | POA: Diagnosis not present

## 2024-01-13 DIAGNOSIS — N186 End stage renal disease: Secondary | ICD-10-CM | POA: Diagnosis not present

## 2024-01-13 DIAGNOSIS — N2581 Secondary hyperparathyroidism of renal origin: Secondary | ICD-10-CM | POA: Diagnosis not present

## 2024-01-13 DIAGNOSIS — E440 Moderate protein-calorie malnutrition: Secondary | ICD-10-CM | POA: Diagnosis not present

## 2024-01-14 DIAGNOSIS — N2581 Secondary hyperparathyroidism of renal origin: Secondary | ICD-10-CM | POA: Diagnosis not present

## 2024-01-14 DIAGNOSIS — E440 Moderate protein-calorie malnutrition: Secondary | ICD-10-CM | POA: Diagnosis not present

## 2024-01-14 DIAGNOSIS — Z992 Dependence on renal dialysis: Secondary | ICD-10-CM | POA: Diagnosis not present

## 2024-01-14 DIAGNOSIS — Z79899 Other long term (current) drug therapy: Secondary | ICD-10-CM | POA: Diagnosis not present

## 2024-01-14 DIAGNOSIS — E1122 Type 2 diabetes mellitus with diabetic chronic kidney disease: Secondary | ICD-10-CM | POA: Diagnosis not present

## 2024-01-14 DIAGNOSIS — D630 Anemia in neoplastic disease: Secondary | ICD-10-CM | POA: Diagnosis not present

## 2024-01-14 DIAGNOSIS — N2589 Other disorders resulting from impaired renal tubular function: Secondary | ICD-10-CM | POA: Diagnosis not present

## 2024-01-14 DIAGNOSIS — E878 Other disorders of electrolyte and fluid balance, not elsewhere classified: Secondary | ICD-10-CM | POA: Diagnosis not present

## 2024-01-14 DIAGNOSIS — N186 End stage renal disease: Secondary | ICD-10-CM | POA: Diagnosis not present

## 2024-01-14 DIAGNOSIS — K7689 Other specified diseases of liver: Secondary | ICD-10-CM | POA: Diagnosis not present

## 2024-01-14 DIAGNOSIS — Z4932 Encounter for adequacy testing for peritoneal dialysis: Secondary | ICD-10-CM | POA: Diagnosis not present

## 2024-01-14 DIAGNOSIS — D509 Iron deficiency anemia, unspecified: Secondary | ICD-10-CM | POA: Diagnosis not present

## 2024-01-15 DIAGNOSIS — N2589 Other disorders resulting from impaired renal tubular function: Secondary | ICD-10-CM | POA: Diagnosis not present

## 2024-01-15 DIAGNOSIS — D630 Anemia in neoplastic disease: Secondary | ICD-10-CM | POA: Diagnosis not present

## 2024-01-15 DIAGNOSIS — E440 Moderate protein-calorie malnutrition: Secondary | ICD-10-CM | POA: Diagnosis not present

## 2024-01-15 DIAGNOSIS — D509 Iron deficiency anemia, unspecified: Secondary | ICD-10-CM | POA: Diagnosis not present

## 2024-01-15 DIAGNOSIS — N186 End stage renal disease: Secondary | ICD-10-CM | POA: Diagnosis not present

## 2024-01-15 DIAGNOSIS — E878 Other disorders of electrolyte and fluid balance, not elsewhere classified: Secondary | ICD-10-CM | POA: Diagnosis not present

## 2024-01-15 DIAGNOSIS — E1122 Type 2 diabetes mellitus with diabetic chronic kidney disease: Secondary | ICD-10-CM | POA: Diagnosis not present

## 2024-01-15 DIAGNOSIS — Z992 Dependence on renal dialysis: Secondary | ICD-10-CM | POA: Diagnosis not present

## 2024-01-15 DIAGNOSIS — N2581 Secondary hyperparathyroidism of renal origin: Secondary | ICD-10-CM | POA: Diagnosis not present

## 2024-01-15 DIAGNOSIS — Z4932 Encounter for adequacy testing for peritoneal dialysis: Secondary | ICD-10-CM | POA: Diagnosis not present

## 2024-01-15 DIAGNOSIS — K7689 Other specified diseases of liver: Secondary | ICD-10-CM | POA: Diagnosis not present

## 2024-01-15 DIAGNOSIS — Z79899 Other long term (current) drug therapy: Secondary | ICD-10-CM | POA: Diagnosis not present

## 2024-01-16 DIAGNOSIS — D509 Iron deficiency anemia, unspecified: Secondary | ICD-10-CM | POA: Diagnosis not present

## 2024-01-16 DIAGNOSIS — E1122 Type 2 diabetes mellitus with diabetic chronic kidney disease: Secondary | ICD-10-CM | POA: Diagnosis not present

## 2024-01-16 DIAGNOSIS — E440 Moderate protein-calorie malnutrition: Secondary | ICD-10-CM | POA: Diagnosis not present

## 2024-01-16 DIAGNOSIS — D630 Anemia in neoplastic disease: Secondary | ICD-10-CM | POA: Diagnosis not present

## 2024-01-16 DIAGNOSIS — N2589 Other disorders resulting from impaired renal tubular function: Secondary | ICD-10-CM | POA: Diagnosis not present

## 2024-01-16 DIAGNOSIS — Z4932 Encounter for adequacy testing for peritoneal dialysis: Secondary | ICD-10-CM | POA: Diagnosis not present

## 2024-01-16 DIAGNOSIS — Z79899 Other long term (current) drug therapy: Secondary | ICD-10-CM | POA: Diagnosis not present

## 2024-01-16 DIAGNOSIS — N186 End stage renal disease: Secondary | ICD-10-CM | POA: Diagnosis not present

## 2024-01-16 DIAGNOSIS — N2581 Secondary hyperparathyroidism of renal origin: Secondary | ICD-10-CM | POA: Diagnosis not present

## 2024-01-16 DIAGNOSIS — K7689 Other specified diseases of liver: Secondary | ICD-10-CM | POA: Diagnosis not present

## 2024-01-16 DIAGNOSIS — Z992 Dependence on renal dialysis: Secondary | ICD-10-CM | POA: Diagnosis not present

## 2024-01-16 DIAGNOSIS — E878 Other disorders of electrolyte and fluid balance, not elsewhere classified: Secondary | ICD-10-CM | POA: Diagnosis not present

## 2024-01-17 DIAGNOSIS — E1122 Type 2 diabetes mellitus with diabetic chronic kidney disease: Secondary | ICD-10-CM | POA: Diagnosis not present

## 2024-01-17 DIAGNOSIS — Z4932 Encounter for adequacy testing for peritoneal dialysis: Secondary | ICD-10-CM | POA: Diagnosis not present

## 2024-01-17 DIAGNOSIS — N2589 Other disorders resulting from impaired renal tubular function: Secondary | ICD-10-CM | POA: Diagnosis not present

## 2024-01-17 DIAGNOSIS — E440 Moderate protein-calorie malnutrition: Secondary | ICD-10-CM | POA: Diagnosis not present

## 2024-01-17 DIAGNOSIS — E878 Other disorders of electrolyte and fluid balance, not elsewhere classified: Secondary | ICD-10-CM | POA: Diagnosis not present

## 2024-01-17 DIAGNOSIS — N2581 Secondary hyperparathyroidism of renal origin: Secondary | ICD-10-CM | POA: Diagnosis not present

## 2024-01-17 DIAGNOSIS — Z992 Dependence on renal dialysis: Secondary | ICD-10-CM | POA: Diagnosis not present

## 2024-01-17 DIAGNOSIS — N186 End stage renal disease: Secondary | ICD-10-CM | POA: Diagnosis not present

## 2024-01-17 DIAGNOSIS — D509 Iron deficiency anemia, unspecified: Secondary | ICD-10-CM | POA: Diagnosis not present

## 2024-01-17 DIAGNOSIS — K7689 Other specified diseases of liver: Secondary | ICD-10-CM | POA: Diagnosis not present

## 2024-01-17 DIAGNOSIS — Z79899 Other long term (current) drug therapy: Secondary | ICD-10-CM | POA: Diagnosis not present

## 2024-01-17 DIAGNOSIS — D630 Anemia in neoplastic disease: Secondary | ICD-10-CM | POA: Diagnosis not present

## 2024-01-18 DIAGNOSIS — N186 End stage renal disease: Secondary | ICD-10-CM | POA: Diagnosis not present

## 2024-01-18 DIAGNOSIS — E878 Other disorders of electrolyte and fluid balance, not elsewhere classified: Secondary | ICD-10-CM | POA: Diagnosis not present

## 2024-01-18 DIAGNOSIS — Z4932 Encounter for adequacy testing for peritoneal dialysis: Secondary | ICD-10-CM | POA: Diagnosis not present

## 2024-01-18 DIAGNOSIS — N2589 Other disorders resulting from impaired renal tubular function: Secondary | ICD-10-CM | POA: Diagnosis not present

## 2024-01-18 DIAGNOSIS — K7689 Other specified diseases of liver: Secondary | ICD-10-CM | POA: Diagnosis not present

## 2024-01-18 DIAGNOSIS — D630 Anemia in neoplastic disease: Secondary | ICD-10-CM | POA: Diagnosis not present

## 2024-01-18 DIAGNOSIS — Z992 Dependence on renal dialysis: Secondary | ICD-10-CM | POA: Diagnosis not present

## 2024-01-18 DIAGNOSIS — E1122 Type 2 diabetes mellitus with diabetic chronic kidney disease: Secondary | ICD-10-CM | POA: Diagnosis not present

## 2024-01-18 DIAGNOSIS — E440 Moderate protein-calorie malnutrition: Secondary | ICD-10-CM | POA: Diagnosis not present

## 2024-01-18 DIAGNOSIS — Z79899 Other long term (current) drug therapy: Secondary | ICD-10-CM | POA: Diagnosis not present

## 2024-01-18 DIAGNOSIS — D509 Iron deficiency anemia, unspecified: Secondary | ICD-10-CM | POA: Diagnosis not present

## 2024-01-18 DIAGNOSIS — N2581 Secondary hyperparathyroidism of renal origin: Secondary | ICD-10-CM | POA: Diagnosis not present

## 2024-01-19 DIAGNOSIS — Z992 Dependence on renal dialysis: Secondary | ICD-10-CM | POA: Diagnosis not present

## 2024-01-19 DIAGNOSIS — E440 Moderate protein-calorie malnutrition: Secondary | ICD-10-CM | POA: Diagnosis not present

## 2024-01-19 DIAGNOSIS — D630 Anemia in neoplastic disease: Secondary | ICD-10-CM | POA: Diagnosis not present

## 2024-01-19 DIAGNOSIS — N186 End stage renal disease: Secondary | ICD-10-CM | POA: Diagnosis not present

## 2024-01-19 DIAGNOSIS — Z4932 Encounter for adequacy testing for peritoneal dialysis: Secondary | ICD-10-CM | POA: Diagnosis not present

## 2024-01-19 DIAGNOSIS — D509 Iron deficiency anemia, unspecified: Secondary | ICD-10-CM | POA: Diagnosis not present

## 2024-01-19 DIAGNOSIS — N2589 Other disorders resulting from impaired renal tubular function: Secondary | ICD-10-CM | POA: Diagnosis not present

## 2024-01-19 DIAGNOSIS — E878 Other disorders of electrolyte and fluid balance, not elsewhere classified: Secondary | ICD-10-CM | POA: Diagnosis not present

## 2024-01-19 DIAGNOSIS — Z79899 Other long term (current) drug therapy: Secondary | ICD-10-CM | POA: Diagnosis not present

## 2024-01-19 DIAGNOSIS — N2581 Secondary hyperparathyroidism of renal origin: Secondary | ICD-10-CM | POA: Diagnosis not present

## 2024-01-19 DIAGNOSIS — K7689 Other specified diseases of liver: Secondary | ICD-10-CM | POA: Diagnosis not present

## 2024-01-19 DIAGNOSIS — E1122 Type 2 diabetes mellitus with diabetic chronic kidney disease: Secondary | ICD-10-CM | POA: Diagnosis not present

## 2024-01-20 DIAGNOSIS — E878 Other disorders of electrolyte and fluid balance, not elsewhere classified: Secondary | ICD-10-CM | POA: Diagnosis not present

## 2024-01-20 DIAGNOSIS — N186 End stage renal disease: Secondary | ICD-10-CM | POA: Diagnosis not present

## 2024-01-20 DIAGNOSIS — Z4932 Encounter for adequacy testing for peritoneal dialysis: Secondary | ICD-10-CM | POA: Diagnosis not present

## 2024-01-20 DIAGNOSIS — D509 Iron deficiency anemia, unspecified: Secondary | ICD-10-CM | POA: Diagnosis not present

## 2024-01-20 DIAGNOSIS — N2589 Other disorders resulting from impaired renal tubular function: Secondary | ICD-10-CM | POA: Diagnosis not present

## 2024-01-20 DIAGNOSIS — E440 Moderate protein-calorie malnutrition: Secondary | ICD-10-CM | POA: Diagnosis not present

## 2024-01-20 DIAGNOSIS — E1122 Type 2 diabetes mellitus with diabetic chronic kidney disease: Secondary | ICD-10-CM | POA: Diagnosis not present

## 2024-01-20 DIAGNOSIS — D630 Anemia in neoplastic disease: Secondary | ICD-10-CM | POA: Diagnosis not present

## 2024-01-20 DIAGNOSIS — K7689 Other specified diseases of liver: Secondary | ICD-10-CM | POA: Diagnosis not present

## 2024-01-20 DIAGNOSIS — N2581 Secondary hyperparathyroidism of renal origin: Secondary | ICD-10-CM | POA: Diagnosis not present

## 2024-01-20 DIAGNOSIS — Z79899 Other long term (current) drug therapy: Secondary | ICD-10-CM | POA: Diagnosis not present

## 2024-01-20 DIAGNOSIS — Z992 Dependence on renal dialysis: Secondary | ICD-10-CM | POA: Diagnosis not present

## 2024-01-21 DIAGNOSIS — D630 Anemia in neoplastic disease: Secondary | ICD-10-CM | POA: Diagnosis not present

## 2024-01-21 DIAGNOSIS — N2581 Secondary hyperparathyroidism of renal origin: Secondary | ICD-10-CM | POA: Diagnosis not present

## 2024-01-21 DIAGNOSIS — Z79899 Other long term (current) drug therapy: Secondary | ICD-10-CM | POA: Diagnosis not present

## 2024-01-21 DIAGNOSIS — E878 Other disorders of electrolyte and fluid balance, not elsewhere classified: Secondary | ICD-10-CM | POA: Diagnosis not present

## 2024-01-21 DIAGNOSIS — Z4932 Encounter for adequacy testing for peritoneal dialysis: Secondary | ICD-10-CM | POA: Diagnosis not present

## 2024-01-21 DIAGNOSIS — N186 End stage renal disease: Secondary | ICD-10-CM | POA: Diagnosis not present

## 2024-01-21 DIAGNOSIS — D509 Iron deficiency anemia, unspecified: Secondary | ICD-10-CM | POA: Diagnosis not present

## 2024-01-21 DIAGNOSIS — K7689 Other specified diseases of liver: Secondary | ICD-10-CM | POA: Diagnosis not present

## 2024-01-21 DIAGNOSIS — E1122 Type 2 diabetes mellitus with diabetic chronic kidney disease: Secondary | ICD-10-CM | POA: Diagnosis not present

## 2024-01-21 DIAGNOSIS — E440 Moderate protein-calorie malnutrition: Secondary | ICD-10-CM | POA: Diagnosis not present

## 2024-01-21 DIAGNOSIS — Z992 Dependence on renal dialysis: Secondary | ICD-10-CM | POA: Diagnosis not present

## 2024-01-21 DIAGNOSIS — N2589 Other disorders resulting from impaired renal tubular function: Secondary | ICD-10-CM | POA: Diagnosis not present

## 2024-01-22 DIAGNOSIS — E440 Moderate protein-calorie malnutrition: Secondary | ICD-10-CM | POA: Diagnosis not present

## 2024-01-22 DIAGNOSIS — Z4932 Encounter for adequacy testing for peritoneal dialysis: Secondary | ICD-10-CM | POA: Diagnosis not present

## 2024-01-22 DIAGNOSIS — N2589 Other disorders resulting from impaired renal tubular function: Secondary | ICD-10-CM | POA: Diagnosis not present

## 2024-01-22 DIAGNOSIS — N186 End stage renal disease: Secondary | ICD-10-CM | POA: Diagnosis not present

## 2024-01-22 DIAGNOSIS — D509 Iron deficiency anemia, unspecified: Secondary | ICD-10-CM | POA: Diagnosis not present

## 2024-01-22 DIAGNOSIS — Z79899 Other long term (current) drug therapy: Secondary | ICD-10-CM | POA: Diagnosis not present

## 2024-01-22 DIAGNOSIS — E1122 Type 2 diabetes mellitus with diabetic chronic kidney disease: Secondary | ICD-10-CM | POA: Diagnosis not present

## 2024-01-22 DIAGNOSIS — N2581 Secondary hyperparathyroidism of renal origin: Secondary | ICD-10-CM | POA: Diagnosis not present

## 2024-01-22 DIAGNOSIS — Z992 Dependence on renal dialysis: Secondary | ICD-10-CM | POA: Diagnosis not present

## 2024-01-22 DIAGNOSIS — E878 Other disorders of electrolyte and fluid balance, not elsewhere classified: Secondary | ICD-10-CM | POA: Diagnosis not present

## 2024-01-22 DIAGNOSIS — D630 Anemia in neoplastic disease: Secondary | ICD-10-CM | POA: Diagnosis not present

## 2024-01-22 DIAGNOSIS — K7689 Other specified diseases of liver: Secondary | ICD-10-CM | POA: Diagnosis not present

## 2024-01-23 DIAGNOSIS — D509 Iron deficiency anemia, unspecified: Secondary | ICD-10-CM | POA: Diagnosis not present

## 2024-01-23 DIAGNOSIS — E878 Other disorders of electrolyte and fluid balance, not elsewhere classified: Secondary | ICD-10-CM | POA: Diagnosis not present

## 2024-01-23 DIAGNOSIS — Z79899 Other long term (current) drug therapy: Secondary | ICD-10-CM | POA: Diagnosis not present

## 2024-01-23 DIAGNOSIS — Z992 Dependence on renal dialysis: Secondary | ICD-10-CM | POA: Diagnosis not present

## 2024-01-23 DIAGNOSIS — N186 End stage renal disease: Secondary | ICD-10-CM | POA: Diagnosis not present

## 2024-01-23 DIAGNOSIS — E1122 Type 2 diabetes mellitus with diabetic chronic kidney disease: Secondary | ICD-10-CM | POA: Diagnosis not present

## 2024-01-23 DIAGNOSIS — E440 Moderate protein-calorie malnutrition: Secondary | ICD-10-CM | POA: Diagnosis not present

## 2024-01-23 DIAGNOSIS — E1129 Type 2 diabetes mellitus with other diabetic kidney complication: Secondary | ICD-10-CM | POA: Diagnosis not present

## 2024-01-23 DIAGNOSIS — D630 Anemia in neoplastic disease: Secondary | ICD-10-CM | POA: Diagnosis not present

## 2024-01-23 DIAGNOSIS — Z4932 Encounter for adequacy testing for peritoneal dialysis: Secondary | ICD-10-CM | POA: Diagnosis not present

## 2024-01-23 DIAGNOSIS — K7689 Other specified diseases of liver: Secondary | ICD-10-CM | POA: Diagnosis not present

## 2024-01-23 DIAGNOSIS — N2589 Other disorders resulting from impaired renal tubular function: Secondary | ICD-10-CM | POA: Diagnosis not present

## 2024-01-23 DIAGNOSIS — N2581 Secondary hyperparathyroidism of renal origin: Secondary | ICD-10-CM | POA: Diagnosis not present

## 2024-01-24 DIAGNOSIS — N186 End stage renal disease: Secondary | ICD-10-CM | POA: Diagnosis not present

## 2024-01-24 DIAGNOSIS — Z992 Dependence on renal dialysis: Secondary | ICD-10-CM | POA: Diagnosis not present

## 2024-01-24 DIAGNOSIS — N2581 Secondary hyperparathyroidism of renal origin: Secondary | ICD-10-CM | POA: Diagnosis not present

## 2024-01-25 DIAGNOSIS — N186 End stage renal disease: Secondary | ICD-10-CM | POA: Diagnosis not present

## 2024-01-25 DIAGNOSIS — Z992 Dependence on renal dialysis: Secondary | ICD-10-CM | POA: Diagnosis not present

## 2024-01-25 DIAGNOSIS — N2581 Secondary hyperparathyroidism of renal origin: Secondary | ICD-10-CM | POA: Diagnosis not present

## 2024-01-26 DIAGNOSIS — Z992 Dependence on renal dialysis: Secondary | ICD-10-CM | POA: Diagnosis not present

## 2024-01-26 DIAGNOSIS — N186 End stage renal disease: Secondary | ICD-10-CM | POA: Diagnosis not present

## 2024-01-26 DIAGNOSIS — N2581 Secondary hyperparathyroidism of renal origin: Secondary | ICD-10-CM | POA: Diagnosis not present

## 2024-01-27 DIAGNOSIS — N186 End stage renal disease: Secondary | ICD-10-CM | POA: Diagnosis not present

## 2024-01-27 DIAGNOSIS — N2581 Secondary hyperparathyroidism of renal origin: Secondary | ICD-10-CM | POA: Diagnosis not present

## 2024-01-27 DIAGNOSIS — Z992 Dependence on renal dialysis: Secondary | ICD-10-CM | POA: Diagnosis not present

## 2024-01-28 DIAGNOSIS — Z992 Dependence on renal dialysis: Secondary | ICD-10-CM | POA: Diagnosis not present

## 2024-01-28 DIAGNOSIS — N2581 Secondary hyperparathyroidism of renal origin: Secondary | ICD-10-CM | POA: Diagnosis not present

## 2024-01-28 DIAGNOSIS — N186 End stage renal disease: Secondary | ICD-10-CM | POA: Diagnosis not present

## 2024-01-29 DIAGNOSIS — N2581 Secondary hyperparathyroidism of renal origin: Secondary | ICD-10-CM | POA: Diagnosis not present

## 2024-01-29 DIAGNOSIS — N186 End stage renal disease: Secondary | ICD-10-CM | POA: Diagnosis not present

## 2024-01-29 DIAGNOSIS — Z992 Dependence on renal dialysis: Secondary | ICD-10-CM | POA: Diagnosis not present

## 2024-01-30 DIAGNOSIS — Z992 Dependence on renal dialysis: Secondary | ICD-10-CM | POA: Diagnosis not present

## 2024-01-30 DIAGNOSIS — N186 End stage renal disease: Secondary | ICD-10-CM | POA: Diagnosis not present

## 2024-01-30 DIAGNOSIS — N2581 Secondary hyperparathyroidism of renal origin: Secondary | ICD-10-CM | POA: Diagnosis not present

## 2024-01-31 DIAGNOSIS — Z992 Dependence on renal dialysis: Secondary | ICD-10-CM | POA: Diagnosis not present

## 2024-01-31 DIAGNOSIS — N2581 Secondary hyperparathyroidism of renal origin: Secondary | ICD-10-CM | POA: Diagnosis not present

## 2024-01-31 DIAGNOSIS — N186 End stage renal disease: Secondary | ICD-10-CM | POA: Diagnosis not present

## 2024-02-01 DIAGNOSIS — N2581 Secondary hyperparathyroidism of renal origin: Secondary | ICD-10-CM | POA: Diagnosis not present

## 2024-02-01 DIAGNOSIS — Z992 Dependence on renal dialysis: Secondary | ICD-10-CM | POA: Diagnosis not present

## 2024-02-01 DIAGNOSIS — N186 End stage renal disease: Secondary | ICD-10-CM | POA: Diagnosis not present

## 2024-02-02 DIAGNOSIS — N186 End stage renal disease: Secondary | ICD-10-CM | POA: Diagnosis not present

## 2024-02-02 DIAGNOSIS — Z992 Dependence on renal dialysis: Secondary | ICD-10-CM | POA: Diagnosis not present

## 2024-02-02 DIAGNOSIS — N2581 Secondary hyperparathyroidism of renal origin: Secondary | ICD-10-CM | POA: Diagnosis not present

## 2024-02-03 DIAGNOSIS — N186 End stage renal disease: Secondary | ICD-10-CM | POA: Diagnosis not present

## 2024-02-03 DIAGNOSIS — Z992 Dependence on renal dialysis: Secondary | ICD-10-CM | POA: Diagnosis not present

## 2024-02-03 DIAGNOSIS — N2581 Secondary hyperparathyroidism of renal origin: Secondary | ICD-10-CM | POA: Diagnosis not present

## 2024-02-04 DIAGNOSIS — Z992 Dependence on renal dialysis: Secondary | ICD-10-CM | POA: Diagnosis not present

## 2024-02-04 DIAGNOSIS — N2581 Secondary hyperparathyroidism of renal origin: Secondary | ICD-10-CM | POA: Diagnosis not present

## 2024-02-04 DIAGNOSIS — N186 End stage renal disease: Secondary | ICD-10-CM | POA: Diagnosis not present

## 2024-02-05 DIAGNOSIS — Z992 Dependence on renal dialysis: Secondary | ICD-10-CM | POA: Diagnosis not present

## 2024-02-05 DIAGNOSIS — N2581 Secondary hyperparathyroidism of renal origin: Secondary | ICD-10-CM | POA: Diagnosis not present

## 2024-02-05 DIAGNOSIS — N186 End stage renal disease: Secondary | ICD-10-CM | POA: Diagnosis not present

## 2024-02-06 DIAGNOSIS — N186 End stage renal disease: Secondary | ICD-10-CM | POA: Diagnosis not present

## 2024-02-06 DIAGNOSIS — N2581 Secondary hyperparathyroidism of renal origin: Secondary | ICD-10-CM | POA: Diagnosis not present

## 2024-02-06 DIAGNOSIS — Z992 Dependence on renal dialysis: Secondary | ICD-10-CM | POA: Diagnosis not present

## 2024-02-07 DIAGNOSIS — Z992 Dependence on renal dialysis: Secondary | ICD-10-CM | POA: Diagnosis not present

## 2024-02-07 DIAGNOSIS — N186 End stage renal disease: Secondary | ICD-10-CM | POA: Diagnosis not present

## 2024-02-07 DIAGNOSIS — N2581 Secondary hyperparathyroidism of renal origin: Secondary | ICD-10-CM | POA: Diagnosis not present

## 2024-02-08 DIAGNOSIS — N2581 Secondary hyperparathyroidism of renal origin: Secondary | ICD-10-CM | POA: Diagnosis not present

## 2024-02-08 DIAGNOSIS — Z992 Dependence on renal dialysis: Secondary | ICD-10-CM | POA: Diagnosis not present

## 2024-02-08 DIAGNOSIS — N186 End stage renal disease: Secondary | ICD-10-CM | POA: Diagnosis not present

## 2024-02-09 DIAGNOSIS — N2581 Secondary hyperparathyroidism of renal origin: Secondary | ICD-10-CM | POA: Diagnosis not present

## 2024-02-09 DIAGNOSIS — Z992 Dependence on renal dialysis: Secondary | ICD-10-CM | POA: Diagnosis not present

## 2024-02-09 DIAGNOSIS — N186 End stage renal disease: Secondary | ICD-10-CM | POA: Diagnosis not present

## 2024-02-10 DIAGNOSIS — Z992 Dependence on renal dialysis: Secondary | ICD-10-CM | POA: Diagnosis not present

## 2024-02-10 DIAGNOSIS — N186 End stage renal disease: Secondary | ICD-10-CM | POA: Diagnosis not present

## 2024-02-10 DIAGNOSIS — N2581 Secondary hyperparathyroidism of renal origin: Secondary | ICD-10-CM | POA: Diagnosis not present

## 2024-02-11 DIAGNOSIS — N186 End stage renal disease: Secondary | ICD-10-CM | POA: Diagnosis not present

## 2024-02-11 DIAGNOSIS — Z992 Dependence on renal dialysis: Secondary | ICD-10-CM | POA: Diagnosis not present

## 2024-02-11 DIAGNOSIS — N2581 Secondary hyperparathyroidism of renal origin: Secondary | ICD-10-CM | POA: Diagnosis not present

## 2024-02-12 DIAGNOSIS — N186 End stage renal disease: Secondary | ICD-10-CM | POA: Diagnosis not present

## 2024-02-12 DIAGNOSIS — Z992 Dependence on renal dialysis: Secondary | ICD-10-CM | POA: Diagnosis not present

## 2024-02-12 DIAGNOSIS — N2581 Secondary hyperparathyroidism of renal origin: Secondary | ICD-10-CM | POA: Diagnosis not present

## 2024-02-13 DIAGNOSIS — N186 End stage renal disease: Secondary | ICD-10-CM | POA: Diagnosis not present

## 2024-02-13 DIAGNOSIS — Z992 Dependence on renal dialysis: Secondary | ICD-10-CM | POA: Diagnosis not present

## 2024-02-13 DIAGNOSIS — N2581 Secondary hyperparathyroidism of renal origin: Secondary | ICD-10-CM | POA: Diagnosis not present

## 2024-02-14 DIAGNOSIS — N2581 Secondary hyperparathyroidism of renal origin: Secondary | ICD-10-CM | POA: Diagnosis not present

## 2024-02-14 DIAGNOSIS — N186 End stage renal disease: Secondary | ICD-10-CM | POA: Diagnosis not present

## 2024-02-14 DIAGNOSIS — Z992 Dependence on renal dialysis: Secondary | ICD-10-CM | POA: Diagnosis not present

## 2024-02-15 DIAGNOSIS — N2581 Secondary hyperparathyroidism of renal origin: Secondary | ICD-10-CM | POA: Diagnosis not present

## 2024-02-15 DIAGNOSIS — Z992 Dependence on renal dialysis: Secondary | ICD-10-CM | POA: Diagnosis not present

## 2024-02-15 DIAGNOSIS — N186 End stage renal disease: Secondary | ICD-10-CM | POA: Diagnosis not present

## 2024-02-16 DIAGNOSIS — Z992 Dependence on renal dialysis: Secondary | ICD-10-CM | POA: Diagnosis not present

## 2024-02-16 DIAGNOSIS — N186 End stage renal disease: Secondary | ICD-10-CM | POA: Diagnosis not present

## 2024-02-16 DIAGNOSIS — N2581 Secondary hyperparathyroidism of renal origin: Secondary | ICD-10-CM | POA: Diagnosis not present

## 2024-02-17 DIAGNOSIS — N186 End stage renal disease: Secondary | ICD-10-CM | POA: Diagnosis not present

## 2024-02-17 DIAGNOSIS — N2581 Secondary hyperparathyroidism of renal origin: Secondary | ICD-10-CM | POA: Diagnosis not present

## 2024-02-17 DIAGNOSIS — Z992 Dependence on renal dialysis: Secondary | ICD-10-CM | POA: Diagnosis not present

## 2024-02-18 DIAGNOSIS — D485 Neoplasm of uncertain behavior of skin: Secondary | ICD-10-CM | POA: Diagnosis not present

## 2024-02-18 DIAGNOSIS — L570 Actinic keratosis: Secondary | ICD-10-CM | POA: Diagnosis not present

## 2024-02-18 DIAGNOSIS — N2581 Secondary hyperparathyroidism of renal origin: Secondary | ICD-10-CM | POA: Diagnosis not present

## 2024-02-18 DIAGNOSIS — C44622 Squamous cell carcinoma of skin of right upper limb, including shoulder: Secondary | ICD-10-CM | POA: Diagnosis not present

## 2024-02-18 DIAGNOSIS — L821 Other seborrheic keratosis: Secondary | ICD-10-CM | POA: Diagnosis not present

## 2024-02-18 DIAGNOSIS — Z85828 Personal history of other malignant neoplasm of skin: Secondary | ICD-10-CM | POA: Diagnosis not present

## 2024-02-18 DIAGNOSIS — C44329 Squamous cell carcinoma of skin of other parts of face: Secondary | ICD-10-CM | POA: Diagnosis not present

## 2024-02-18 DIAGNOSIS — D4819 Other specified neoplasm of uncertain behavior of connective and other soft tissue: Secondary | ICD-10-CM | POA: Diagnosis not present

## 2024-02-18 DIAGNOSIS — Z992 Dependence on renal dialysis: Secondary | ICD-10-CM | POA: Diagnosis not present

## 2024-02-18 DIAGNOSIS — N186 End stage renal disease: Secondary | ICD-10-CM | POA: Diagnosis not present

## 2024-02-18 DIAGNOSIS — L814 Other melanin hyperpigmentation: Secondary | ICD-10-CM | POA: Diagnosis not present

## 2024-02-19 DIAGNOSIS — N2581 Secondary hyperparathyroidism of renal origin: Secondary | ICD-10-CM | POA: Diagnosis not present

## 2024-02-19 DIAGNOSIS — N186 End stage renal disease: Secondary | ICD-10-CM | POA: Diagnosis not present

## 2024-02-19 DIAGNOSIS — Z992 Dependence on renal dialysis: Secondary | ICD-10-CM | POA: Diagnosis not present

## 2024-02-20 DIAGNOSIS — Z992 Dependence on renal dialysis: Secondary | ICD-10-CM | POA: Diagnosis not present

## 2024-02-20 DIAGNOSIS — N2581 Secondary hyperparathyroidism of renal origin: Secondary | ICD-10-CM | POA: Diagnosis not present

## 2024-02-20 DIAGNOSIS — N186 End stage renal disease: Secondary | ICD-10-CM | POA: Diagnosis not present

## 2024-02-20 DIAGNOSIS — E1129 Type 2 diabetes mellitus with other diabetic kidney complication: Secondary | ICD-10-CM | POA: Diagnosis not present

## 2024-02-21 DIAGNOSIS — D509 Iron deficiency anemia, unspecified: Secondary | ICD-10-CM | POA: Diagnosis not present

## 2024-02-21 DIAGNOSIS — E878 Other disorders of electrolyte and fluid balance, not elsewhere classified: Secondary | ICD-10-CM | POA: Diagnosis not present

## 2024-02-21 DIAGNOSIS — E440 Moderate protein-calorie malnutrition: Secondary | ICD-10-CM | POA: Diagnosis not present

## 2024-02-21 DIAGNOSIS — N186 End stage renal disease: Secondary | ICD-10-CM | POA: Diagnosis not present

## 2024-02-21 DIAGNOSIS — D631 Anemia in chronic kidney disease: Secondary | ICD-10-CM | POA: Diagnosis not present

## 2024-02-21 DIAGNOSIS — Z992 Dependence on renal dialysis: Secondary | ICD-10-CM | POA: Diagnosis not present

## 2024-02-21 DIAGNOSIS — N2581 Secondary hyperparathyroidism of renal origin: Secondary | ICD-10-CM | POA: Diagnosis not present

## 2024-02-21 DIAGNOSIS — Z4932 Encounter for adequacy testing for peritoneal dialysis: Secondary | ICD-10-CM | POA: Diagnosis not present

## 2024-02-21 DIAGNOSIS — Z79899 Other long term (current) drug therapy: Secondary | ICD-10-CM | POA: Diagnosis not present

## 2024-02-21 DIAGNOSIS — K7689 Other specified diseases of liver: Secondary | ICD-10-CM | POA: Diagnosis not present

## 2024-02-22 DIAGNOSIS — N2581 Secondary hyperparathyroidism of renal origin: Secondary | ICD-10-CM | POA: Diagnosis not present

## 2024-02-22 DIAGNOSIS — Z79899 Other long term (current) drug therapy: Secondary | ICD-10-CM | POA: Diagnosis not present

## 2024-02-22 DIAGNOSIS — D631 Anemia in chronic kidney disease: Secondary | ICD-10-CM | POA: Diagnosis not present

## 2024-02-22 DIAGNOSIS — E878 Other disorders of electrolyte and fluid balance, not elsewhere classified: Secondary | ICD-10-CM | POA: Diagnosis not present

## 2024-02-22 DIAGNOSIS — N186 End stage renal disease: Secondary | ICD-10-CM | POA: Diagnosis not present

## 2024-02-22 DIAGNOSIS — Z4932 Encounter for adequacy testing for peritoneal dialysis: Secondary | ICD-10-CM | POA: Diagnosis not present

## 2024-02-22 DIAGNOSIS — K7689 Other specified diseases of liver: Secondary | ICD-10-CM | POA: Diagnosis not present

## 2024-02-22 DIAGNOSIS — E440 Moderate protein-calorie malnutrition: Secondary | ICD-10-CM | POA: Diagnosis not present

## 2024-02-22 DIAGNOSIS — D509 Iron deficiency anemia, unspecified: Secondary | ICD-10-CM | POA: Diagnosis not present

## 2024-02-22 DIAGNOSIS — Z992 Dependence on renal dialysis: Secondary | ICD-10-CM | POA: Diagnosis not present

## 2024-02-23 DIAGNOSIS — N186 End stage renal disease: Secondary | ICD-10-CM | POA: Diagnosis not present

## 2024-02-23 DIAGNOSIS — D631 Anemia in chronic kidney disease: Secondary | ICD-10-CM | POA: Diagnosis not present

## 2024-02-23 DIAGNOSIS — Z4932 Encounter for adequacy testing for peritoneal dialysis: Secondary | ICD-10-CM | POA: Diagnosis not present

## 2024-02-23 DIAGNOSIS — E878 Other disorders of electrolyte and fluid balance, not elsewhere classified: Secondary | ICD-10-CM | POA: Diagnosis not present

## 2024-02-23 DIAGNOSIS — N2581 Secondary hyperparathyroidism of renal origin: Secondary | ICD-10-CM | POA: Diagnosis not present

## 2024-02-23 DIAGNOSIS — Z79899 Other long term (current) drug therapy: Secondary | ICD-10-CM | POA: Diagnosis not present

## 2024-02-23 DIAGNOSIS — E440 Moderate protein-calorie malnutrition: Secondary | ICD-10-CM | POA: Diagnosis not present

## 2024-02-23 DIAGNOSIS — D509 Iron deficiency anemia, unspecified: Secondary | ICD-10-CM | POA: Diagnosis not present

## 2024-02-23 DIAGNOSIS — K7689 Other specified diseases of liver: Secondary | ICD-10-CM | POA: Diagnosis not present

## 2024-02-23 DIAGNOSIS — Z992 Dependence on renal dialysis: Secondary | ICD-10-CM | POA: Diagnosis not present

## 2024-02-24 DIAGNOSIS — E440 Moderate protein-calorie malnutrition: Secondary | ICD-10-CM | POA: Diagnosis not present

## 2024-02-24 DIAGNOSIS — Z4932 Encounter for adequacy testing for peritoneal dialysis: Secondary | ICD-10-CM | POA: Diagnosis not present

## 2024-02-24 DIAGNOSIS — Z992 Dependence on renal dialysis: Secondary | ICD-10-CM | POA: Diagnosis not present

## 2024-02-24 DIAGNOSIS — D631 Anemia in chronic kidney disease: Secondary | ICD-10-CM | POA: Diagnosis not present

## 2024-02-24 DIAGNOSIS — E878 Other disorders of electrolyte and fluid balance, not elsewhere classified: Secondary | ICD-10-CM | POA: Diagnosis not present

## 2024-02-24 DIAGNOSIS — N2581 Secondary hyperparathyroidism of renal origin: Secondary | ICD-10-CM | POA: Diagnosis not present

## 2024-02-24 DIAGNOSIS — K7689 Other specified diseases of liver: Secondary | ICD-10-CM | POA: Diagnosis not present

## 2024-02-24 DIAGNOSIS — N186 End stage renal disease: Secondary | ICD-10-CM | POA: Diagnosis not present

## 2024-02-24 DIAGNOSIS — Z79899 Other long term (current) drug therapy: Secondary | ICD-10-CM | POA: Diagnosis not present

## 2024-02-24 DIAGNOSIS — D509 Iron deficiency anemia, unspecified: Secondary | ICD-10-CM | POA: Diagnosis not present

## 2024-02-25 DIAGNOSIS — Z992 Dependence on renal dialysis: Secondary | ICD-10-CM | POA: Diagnosis not present

## 2024-02-25 DIAGNOSIS — Z79899 Other long term (current) drug therapy: Secondary | ICD-10-CM | POA: Diagnosis not present

## 2024-02-25 DIAGNOSIS — N2581 Secondary hyperparathyroidism of renal origin: Secondary | ICD-10-CM | POA: Diagnosis not present

## 2024-02-25 DIAGNOSIS — E440 Moderate protein-calorie malnutrition: Secondary | ICD-10-CM | POA: Diagnosis not present

## 2024-02-25 DIAGNOSIS — N186 End stage renal disease: Secondary | ICD-10-CM | POA: Diagnosis not present

## 2024-02-25 DIAGNOSIS — D509 Iron deficiency anemia, unspecified: Secondary | ICD-10-CM | POA: Diagnosis not present

## 2024-02-25 DIAGNOSIS — D631 Anemia in chronic kidney disease: Secondary | ICD-10-CM | POA: Diagnosis not present

## 2024-02-25 DIAGNOSIS — E878 Other disorders of electrolyte and fluid balance, not elsewhere classified: Secondary | ICD-10-CM | POA: Diagnosis not present

## 2024-02-25 DIAGNOSIS — K7689 Other specified diseases of liver: Secondary | ICD-10-CM | POA: Diagnosis not present

## 2024-02-25 DIAGNOSIS — Z4932 Encounter for adequacy testing for peritoneal dialysis: Secondary | ICD-10-CM | POA: Diagnosis not present

## 2024-02-26 DIAGNOSIS — D509 Iron deficiency anemia, unspecified: Secondary | ICD-10-CM | POA: Diagnosis not present

## 2024-02-26 DIAGNOSIS — E878 Other disorders of electrolyte and fluid balance, not elsewhere classified: Secondary | ICD-10-CM | POA: Diagnosis not present

## 2024-02-26 DIAGNOSIS — N186 End stage renal disease: Secondary | ICD-10-CM | POA: Diagnosis not present

## 2024-02-26 DIAGNOSIS — K7689 Other specified diseases of liver: Secondary | ICD-10-CM | POA: Diagnosis not present

## 2024-02-26 DIAGNOSIS — Z79899 Other long term (current) drug therapy: Secondary | ICD-10-CM | POA: Diagnosis not present

## 2024-02-26 DIAGNOSIS — Z4932 Encounter for adequacy testing for peritoneal dialysis: Secondary | ICD-10-CM | POA: Diagnosis not present

## 2024-02-26 DIAGNOSIS — N2581 Secondary hyperparathyroidism of renal origin: Secondary | ICD-10-CM | POA: Diagnosis not present

## 2024-02-26 DIAGNOSIS — D631 Anemia in chronic kidney disease: Secondary | ICD-10-CM | POA: Diagnosis not present

## 2024-02-26 DIAGNOSIS — Z992 Dependence on renal dialysis: Secondary | ICD-10-CM | POA: Diagnosis not present

## 2024-02-26 DIAGNOSIS — E440 Moderate protein-calorie malnutrition: Secondary | ICD-10-CM | POA: Diagnosis not present

## 2024-02-27 DIAGNOSIS — N2581 Secondary hyperparathyroidism of renal origin: Secondary | ICD-10-CM | POA: Diagnosis not present

## 2024-02-27 DIAGNOSIS — Z79899 Other long term (current) drug therapy: Secondary | ICD-10-CM | POA: Diagnosis not present

## 2024-02-27 DIAGNOSIS — Z4932 Encounter for adequacy testing for peritoneal dialysis: Secondary | ICD-10-CM | POA: Diagnosis not present

## 2024-02-27 DIAGNOSIS — D509 Iron deficiency anemia, unspecified: Secondary | ICD-10-CM | POA: Diagnosis not present

## 2024-02-27 DIAGNOSIS — K7689 Other specified diseases of liver: Secondary | ICD-10-CM | POA: Diagnosis not present

## 2024-02-27 DIAGNOSIS — N186 End stage renal disease: Secondary | ICD-10-CM | POA: Diagnosis not present

## 2024-02-27 DIAGNOSIS — E440 Moderate protein-calorie malnutrition: Secondary | ICD-10-CM | POA: Diagnosis not present

## 2024-02-27 DIAGNOSIS — D631 Anemia in chronic kidney disease: Secondary | ICD-10-CM | POA: Diagnosis not present

## 2024-02-27 DIAGNOSIS — E878 Other disorders of electrolyte and fluid balance, not elsewhere classified: Secondary | ICD-10-CM | POA: Diagnosis not present

## 2024-02-27 DIAGNOSIS — Z992 Dependence on renal dialysis: Secondary | ICD-10-CM | POA: Diagnosis not present

## 2024-02-28 DIAGNOSIS — Z992 Dependence on renal dialysis: Secondary | ICD-10-CM | POA: Diagnosis not present

## 2024-02-28 DIAGNOSIS — D509 Iron deficiency anemia, unspecified: Secondary | ICD-10-CM | POA: Diagnosis not present

## 2024-02-28 DIAGNOSIS — N2581 Secondary hyperparathyroidism of renal origin: Secondary | ICD-10-CM | POA: Diagnosis not present

## 2024-02-28 DIAGNOSIS — E440 Moderate protein-calorie malnutrition: Secondary | ICD-10-CM | POA: Diagnosis not present

## 2024-02-28 DIAGNOSIS — Z4932 Encounter for adequacy testing for peritoneal dialysis: Secondary | ICD-10-CM | POA: Diagnosis not present

## 2024-02-28 DIAGNOSIS — N186 End stage renal disease: Secondary | ICD-10-CM | POA: Diagnosis not present

## 2024-02-28 DIAGNOSIS — E878 Other disorders of electrolyte and fluid balance, not elsewhere classified: Secondary | ICD-10-CM | POA: Diagnosis not present

## 2024-02-28 DIAGNOSIS — D631 Anemia in chronic kidney disease: Secondary | ICD-10-CM | POA: Diagnosis not present

## 2024-02-28 DIAGNOSIS — Z79899 Other long term (current) drug therapy: Secondary | ICD-10-CM | POA: Diagnosis not present

## 2024-02-28 DIAGNOSIS — K7689 Other specified diseases of liver: Secondary | ICD-10-CM | POA: Diagnosis not present

## 2024-02-29 DIAGNOSIS — Z4932 Encounter for adequacy testing for peritoneal dialysis: Secondary | ICD-10-CM | POA: Diagnosis not present

## 2024-02-29 DIAGNOSIS — Z79899 Other long term (current) drug therapy: Secondary | ICD-10-CM | POA: Diagnosis not present

## 2024-02-29 DIAGNOSIS — D509 Iron deficiency anemia, unspecified: Secondary | ICD-10-CM | POA: Diagnosis not present

## 2024-02-29 DIAGNOSIS — N186 End stage renal disease: Secondary | ICD-10-CM | POA: Diagnosis not present

## 2024-02-29 DIAGNOSIS — Z992 Dependence on renal dialysis: Secondary | ICD-10-CM | POA: Diagnosis not present

## 2024-02-29 DIAGNOSIS — K7689 Other specified diseases of liver: Secondary | ICD-10-CM | POA: Diagnosis not present

## 2024-02-29 DIAGNOSIS — E440 Moderate protein-calorie malnutrition: Secondary | ICD-10-CM | POA: Diagnosis not present

## 2024-02-29 DIAGNOSIS — E878 Other disorders of electrolyte and fluid balance, not elsewhere classified: Secondary | ICD-10-CM | POA: Diagnosis not present

## 2024-02-29 DIAGNOSIS — D631 Anemia in chronic kidney disease: Secondary | ICD-10-CM | POA: Diagnosis not present

## 2024-02-29 DIAGNOSIS — N2581 Secondary hyperparathyroidism of renal origin: Secondary | ICD-10-CM | POA: Diagnosis not present

## 2024-03-01 DIAGNOSIS — D509 Iron deficiency anemia, unspecified: Secondary | ICD-10-CM | POA: Diagnosis not present

## 2024-03-01 DIAGNOSIS — E878 Other disorders of electrolyte and fluid balance, not elsewhere classified: Secondary | ICD-10-CM | POA: Diagnosis not present

## 2024-03-01 DIAGNOSIS — Z4932 Encounter for adequacy testing for peritoneal dialysis: Secondary | ICD-10-CM | POA: Diagnosis not present

## 2024-03-01 DIAGNOSIS — Z79899 Other long term (current) drug therapy: Secondary | ICD-10-CM | POA: Diagnosis not present

## 2024-03-01 DIAGNOSIS — N186 End stage renal disease: Secondary | ICD-10-CM | POA: Diagnosis not present

## 2024-03-01 DIAGNOSIS — K7689 Other specified diseases of liver: Secondary | ICD-10-CM | POA: Diagnosis not present

## 2024-03-01 DIAGNOSIS — N2581 Secondary hyperparathyroidism of renal origin: Secondary | ICD-10-CM | POA: Diagnosis not present

## 2024-03-01 DIAGNOSIS — D631 Anemia in chronic kidney disease: Secondary | ICD-10-CM | POA: Diagnosis not present

## 2024-03-01 DIAGNOSIS — E440 Moderate protein-calorie malnutrition: Secondary | ICD-10-CM | POA: Diagnosis not present

## 2024-03-01 DIAGNOSIS — Z992 Dependence on renal dialysis: Secondary | ICD-10-CM | POA: Diagnosis not present

## 2024-03-02 DIAGNOSIS — D631 Anemia in chronic kidney disease: Secondary | ICD-10-CM | POA: Diagnosis not present

## 2024-03-02 DIAGNOSIS — Z4932 Encounter for adequacy testing for peritoneal dialysis: Secondary | ICD-10-CM | POA: Diagnosis not present

## 2024-03-02 DIAGNOSIS — E878 Other disorders of electrolyte and fluid balance, not elsewhere classified: Secondary | ICD-10-CM | POA: Diagnosis not present

## 2024-03-02 DIAGNOSIS — N2581 Secondary hyperparathyroidism of renal origin: Secondary | ICD-10-CM | POA: Diagnosis not present

## 2024-03-02 DIAGNOSIS — N186 End stage renal disease: Secondary | ICD-10-CM | POA: Diagnosis not present

## 2024-03-02 DIAGNOSIS — K7689 Other specified diseases of liver: Secondary | ICD-10-CM | POA: Diagnosis not present

## 2024-03-02 DIAGNOSIS — Z79899 Other long term (current) drug therapy: Secondary | ICD-10-CM | POA: Diagnosis not present

## 2024-03-02 DIAGNOSIS — D509 Iron deficiency anemia, unspecified: Secondary | ICD-10-CM | POA: Diagnosis not present

## 2024-03-02 DIAGNOSIS — E440 Moderate protein-calorie malnutrition: Secondary | ICD-10-CM | POA: Diagnosis not present

## 2024-03-02 DIAGNOSIS — Z992 Dependence on renal dialysis: Secondary | ICD-10-CM | POA: Diagnosis not present

## 2024-03-03 DIAGNOSIS — D631 Anemia in chronic kidney disease: Secondary | ICD-10-CM | POA: Diagnosis not present

## 2024-03-03 DIAGNOSIS — E440 Moderate protein-calorie malnutrition: Secondary | ICD-10-CM | POA: Diagnosis not present

## 2024-03-03 DIAGNOSIS — Z79899 Other long term (current) drug therapy: Secondary | ICD-10-CM | POA: Diagnosis not present

## 2024-03-03 DIAGNOSIS — E878 Other disorders of electrolyte and fluid balance, not elsewhere classified: Secondary | ICD-10-CM | POA: Diagnosis not present

## 2024-03-03 DIAGNOSIS — N2581 Secondary hyperparathyroidism of renal origin: Secondary | ICD-10-CM | POA: Diagnosis not present

## 2024-03-03 DIAGNOSIS — K7689 Other specified diseases of liver: Secondary | ICD-10-CM | POA: Diagnosis not present

## 2024-03-03 DIAGNOSIS — D509 Iron deficiency anemia, unspecified: Secondary | ICD-10-CM | POA: Diagnosis not present

## 2024-03-03 DIAGNOSIS — N186 End stage renal disease: Secondary | ICD-10-CM | POA: Diagnosis not present

## 2024-03-03 DIAGNOSIS — Z4932 Encounter for adequacy testing for peritoneal dialysis: Secondary | ICD-10-CM | POA: Diagnosis not present

## 2024-03-03 DIAGNOSIS — Z992 Dependence on renal dialysis: Secondary | ICD-10-CM | POA: Diagnosis not present

## 2024-03-04 DIAGNOSIS — D631 Anemia in chronic kidney disease: Secondary | ICD-10-CM | POA: Diagnosis not present

## 2024-03-04 DIAGNOSIS — K7689 Other specified diseases of liver: Secondary | ICD-10-CM | POA: Diagnosis not present

## 2024-03-04 DIAGNOSIS — Z992 Dependence on renal dialysis: Secondary | ICD-10-CM | POA: Diagnosis not present

## 2024-03-04 DIAGNOSIS — D509 Iron deficiency anemia, unspecified: Secondary | ICD-10-CM | POA: Diagnosis not present

## 2024-03-04 DIAGNOSIS — N186 End stage renal disease: Secondary | ICD-10-CM | POA: Diagnosis not present

## 2024-03-04 DIAGNOSIS — N2581 Secondary hyperparathyroidism of renal origin: Secondary | ICD-10-CM | POA: Diagnosis not present

## 2024-03-04 DIAGNOSIS — E440 Moderate protein-calorie malnutrition: Secondary | ICD-10-CM | POA: Diagnosis not present

## 2024-03-04 DIAGNOSIS — E878 Other disorders of electrolyte and fluid balance, not elsewhere classified: Secondary | ICD-10-CM | POA: Diagnosis not present

## 2024-03-04 DIAGNOSIS — Z79899 Other long term (current) drug therapy: Secondary | ICD-10-CM | POA: Diagnosis not present

## 2024-03-04 DIAGNOSIS — Z4932 Encounter for adequacy testing for peritoneal dialysis: Secondary | ICD-10-CM | POA: Diagnosis not present

## 2024-03-05 DIAGNOSIS — D631 Anemia in chronic kidney disease: Secondary | ICD-10-CM | POA: Diagnosis not present

## 2024-03-05 DIAGNOSIS — E878 Other disorders of electrolyte and fluid balance, not elsewhere classified: Secondary | ICD-10-CM | POA: Diagnosis not present

## 2024-03-05 DIAGNOSIS — N186 End stage renal disease: Secondary | ICD-10-CM | POA: Diagnosis not present

## 2024-03-05 DIAGNOSIS — D509 Iron deficiency anemia, unspecified: Secondary | ICD-10-CM | POA: Diagnosis not present

## 2024-03-05 DIAGNOSIS — K7689 Other specified diseases of liver: Secondary | ICD-10-CM | POA: Diagnosis not present

## 2024-03-05 DIAGNOSIS — Z992 Dependence on renal dialysis: Secondary | ICD-10-CM | POA: Diagnosis not present

## 2024-03-05 DIAGNOSIS — Z79899 Other long term (current) drug therapy: Secondary | ICD-10-CM | POA: Diagnosis not present

## 2024-03-05 DIAGNOSIS — N2581 Secondary hyperparathyroidism of renal origin: Secondary | ICD-10-CM | POA: Diagnosis not present

## 2024-03-05 DIAGNOSIS — Z4932 Encounter for adequacy testing for peritoneal dialysis: Secondary | ICD-10-CM | POA: Diagnosis not present

## 2024-03-05 DIAGNOSIS — E440 Moderate protein-calorie malnutrition: Secondary | ICD-10-CM | POA: Diagnosis not present

## 2024-03-06 DIAGNOSIS — N186 End stage renal disease: Secondary | ICD-10-CM | POA: Diagnosis not present

## 2024-03-06 DIAGNOSIS — D631 Anemia in chronic kidney disease: Secondary | ICD-10-CM | POA: Diagnosis not present

## 2024-03-06 DIAGNOSIS — Z992 Dependence on renal dialysis: Secondary | ICD-10-CM | POA: Diagnosis not present

## 2024-03-06 DIAGNOSIS — E440 Moderate protein-calorie malnutrition: Secondary | ICD-10-CM | POA: Diagnosis not present

## 2024-03-06 DIAGNOSIS — E878 Other disorders of electrolyte and fluid balance, not elsewhere classified: Secondary | ICD-10-CM | POA: Diagnosis not present

## 2024-03-06 DIAGNOSIS — Z4932 Encounter for adequacy testing for peritoneal dialysis: Secondary | ICD-10-CM | POA: Diagnosis not present

## 2024-03-06 DIAGNOSIS — Z79899 Other long term (current) drug therapy: Secondary | ICD-10-CM | POA: Diagnosis not present

## 2024-03-06 DIAGNOSIS — K7689 Other specified diseases of liver: Secondary | ICD-10-CM | POA: Diagnosis not present

## 2024-03-06 DIAGNOSIS — D509 Iron deficiency anemia, unspecified: Secondary | ICD-10-CM | POA: Diagnosis not present

## 2024-03-06 DIAGNOSIS — N2581 Secondary hyperparathyroidism of renal origin: Secondary | ICD-10-CM | POA: Diagnosis not present

## 2024-03-07 DIAGNOSIS — N2581 Secondary hyperparathyroidism of renal origin: Secondary | ICD-10-CM | POA: Diagnosis not present

## 2024-03-07 DIAGNOSIS — K7689 Other specified diseases of liver: Secondary | ICD-10-CM | POA: Diagnosis not present

## 2024-03-07 DIAGNOSIS — D509 Iron deficiency anemia, unspecified: Secondary | ICD-10-CM | POA: Diagnosis not present

## 2024-03-07 DIAGNOSIS — Z4932 Encounter for adequacy testing for peritoneal dialysis: Secondary | ICD-10-CM | POA: Diagnosis not present

## 2024-03-07 DIAGNOSIS — Z79899 Other long term (current) drug therapy: Secondary | ICD-10-CM | POA: Diagnosis not present

## 2024-03-07 DIAGNOSIS — E878 Other disorders of electrolyte and fluid balance, not elsewhere classified: Secondary | ICD-10-CM | POA: Diagnosis not present

## 2024-03-07 DIAGNOSIS — D631 Anemia in chronic kidney disease: Secondary | ICD-10-CM | POA: Diagnosis not present

## 2024-03-07 DIAGNOSIS — N186 End stage renal disease: Secondary | ICD-10-CM | POA: Diagnosis not present

## 2024-03-07 DIAGNOSIS — Z992 Dependence on renal dialysis: Secondary | ICD-10-CM | POA: Diagnosis not present

## 2024-03-07 DIAGNOSIS — E440 Moderate protein-calorie malnutrition: Secondary | ICD-10-CM | POA: Diagnosis not present

## 2024-03-08 DIAGNOSIS — N2581 Secondary hyperparathyroidism of renal origin: Secondary | ICD-10-CM | POA: Diagnosis not present

## 2024-03-08 DIAGNOSIS — E440 Moderate protein-calorie malnutrition: Secondary | ICD-10-CM | POA: Diagnosis not present

## 2024-03-08 DIAGNOSIS — Z992 Dependence on renal dialysis: Secondary | ICD-10-CM | POA: Diagnosis not present

## 2024-03-08 DIAGNOSIS — E878 Other disorders of electrolyte and fluid balance, not elsewhere classified: Secondary | ICD-10-CM | POA: Diagnosis not present

## 2024-03-08 DIAGNOSIS — Z79899 Other long term (current) drug therapy: Secondary | ICD-10-CM | POA: Diagnosis not present

## 2024-03-08 DIAGNOSIS — D631 Anemia in chronic kidney disease: Secondary | ICD-10-CM | POA: Diagnosis not present

## 2024-03-08 DIAGNOSIS — K7689 Other specified diseases of liver: Secondary | ICD-10-CM | POA: Diagnosis not present

## 2024-03-08 DIAGNOSIS — D509 Iron deficiency anemia, unspecified: Secondary | ICD-10-CM | POA: Diagnosis not present

## 2024-03-08 DIAGNOSIS — N186 End stage renal disease: Secondary | ICD-10-CM | POA: Diagnosis not present

## 2024-03-08 DIAGNOSIS — Z4932 Encounter for adequacy testing for peritoneal dialysis: Secondary | ICD-10-CM | POA: Diagnosis not present

## 2024-03-09 DIAGNOSIS — D631 Anemia in chronic kidney disease: Secondary | ICD-10-CM | POA: Diagnosis not present

## 2024-03-09 DIAGNOSIS — Z79899 Other long term (current) drug therapy: Secondary | ICD-10-CM | POA: Diagnosis not present

## 2024-03-09 DIAGNOSIS — E878 Other disorders of electrolyte and fluid balance, not elsewhere classified: Secondary | ICD-10-CM | POA: Diagnosis not present

## 2024-03-09 DIAGNOSIS — Z4932 Encounter for adequacy testing for peritoneal dialysis: Secondary | ICD-10-CM | POA: Diagnosis not present

## 2024-03-09 DIAGNOSIS — N186 End stage renal disease: Secondary | ICD-10-CM | POA: Diagnosis not present

## 2024-03-09 DIAGNOSIS — E440 Moderate protein-calorie malnutrition: Secondary | ICD-10-CM | POA: Diagnosis not present

## 2024-03-09 DIAGNOSIS — K7689 Other specified diseases of liver: Secondary | ICD-10-CM | POA: Diagnosis not present

## 2024-03-09 DIAGNOSIS — Z992 Dependence on renal dialysis: Secondary | ICD-10-CM | POA: Diagnosis not present

## 2024-03-09 DIAGNOSIS — N2581 Secondary hyperparathyroidism of renal origin: Secondary | ICD-10-CM | POA: Diagnosis not present

## 2024-03-09 DIAGNOSIS — D509 Iron deficiency anemia, unspecified: Secondary | ICD-10-CM | POA: Diagnosis not present

## 2024-03-10 DIAGNOSIS — D509 Iron deficiency anemia, unspecified: Secondary | ICD-10-CM | POA: Diagnosis not present

## 2024-03-10 DIAGNOSIS — E878 Other disorders of electrolyte and fluid balance, not elsewhere classified: Secondary | ICD-10-CM | POA: Diagnosis not present

## 2024-03-10 DIAGNOSIS — Z4932 Encounter for adequacy testing for peritoneal dialysis: Secondary | ICD-10-CM | POA: Diagnosis not present

## 2024-03-10 DIAGNOSIS — E440 Moderate protein-calorie malnutrition: Secondary | ICD-10-CM | POA: Diagnosis not present

## 2024-03-10 DIAGNOSIS — N186 End stage renal disease: Secondary | ICD-10-CM | POA: Diagnosis not present

## 2024-03-10 DIAGNOSIS — D631 Anemia in chronic kidney disease: Secondary | ICD-10-CM | POA: Diagnosis not present

## 2024-03-10 DIAGNOSIS — Z992 Dependence on renal dialysis: Secondary | ICD-10-CM | POA: Diagnosis not present

## 2024-03-10 DIAGNOSIS — K7689 Other specified diseases of liver: Secondary | ICD-10-CM | POA: Diagnosis not present

## 2024-03-10 DIAGNOSIS — N2581 Secondary hyperparathyroidism of renal origin: Secondary | ICD-10-CM | POA: Diagnosis not present

## 2024-03-10 DIAGNOSIS — Z79899 Other long term (current) drug therapy: Secondary | ICD-10-CM | POA: Diagnosis not present

## 2024-03-11 DIAGNOSIS — D509 Iron deficiency anemia, unspecified: Secondary | ICD-10-CM | POA: Diagnosis not present

## 2024-03-11 DIAGNOSIS — N2581 Secondary hyperparathyroidism of renal origin: Secondary | ICD-10-CM | POA: Diagnosis not present

## 2024-03-11 DIAGNOSIS — N186 End stage renal disease: Secondary | ICD-10-CM | POA: Diagnosis not present

## 2024-03-11 DIAGNOSIS — K7689 Other specified diseases of liver: Secondary | ICD-10-CM | POA: Diagnosis not present

## 2024-03-11 DIAGNOSIS — Z4932 Encounter for adequacy testing for peritoneal dialysis: Secondary | ICD-10-CM | POA: Diagnosis not present

## 2024-03-11 DIAGNOSIS — D631 Anemia in chronic kidney disease: Secondary | ICD-10-CM | POA: Diagnosis not present

## 2024-03-11 DIAGNOSIS — E878 Other disorders of electrolyte and fluid balance, not elsewhere classified: Secondary | ICD-10-CM | POA: Diagnosis not present

## 2024-03-11 DIAGNOSIS — Z79899 Other long term (current) drug therapy: Secondary | ICD-10-CM | POA: Diagnosis not present

## 2024-03-11 DIAGNOSIS — Z992 Dependence on renal dialysis: Secondary | ICD-10-CM | POA: Diagnosis not present

## 2024-03-11 DIAGNOSIS — E440 Moderate protein-calorie malnutrition: Secondary | ICD-10-CM | POA: Diagnosis not present

## 2024-03-12 DIAGNOSIS — Z992 Dependence on renal dialysis: Secondary | ICD-10-CM | POA: Diagnosis not present

## 2024-03-12 DIAGNOSIS — D509 Iron deficiency anemia, unspecified: Secondary | ICD-10-CM | POA: Diagnosis not present

## 2024-03-12 DIAGNOSIS — E440 Moderate protein-calorie malnutrition: Secondary | ICD-10-CM | POA: Diagnosis not present

## 2024-03-12 DIAGNOSIS — N2581 Secondary hyperparathyroidism of renal origin: Secondary | ICD-10-CM | POA: Diagnosis not present

## 2024-03-12 DIAGNOSIS — Z4932 Encounter for adequacy testing for peritoneal dialysis: Secondary | ICD-10-CM | POA: Diagnosis not present

## 2024-03-12 DIAGNOSIS — N186 End stage renal disease: Secondary | ICD-10-CM | POA: Diagnosis not present

## 2024-03-12 DIAGNOSIS — E878 Other disorders of electrolyte and fluid balance, not elsewhere classified: Secondary | ICD-10-CM | POA: Diagnosis not present

## 2024-03-12 DIAGNOSIS — Z79899 Other long term (current) drug therapy: Secondary | ICD-10-CM | POA: Diagnosis not present

## 2024-03-12 DIAGNOSIS — K7689 Other specified diseases of liver: Secondary | ICD-10-CM | POA: Diagnosis not present

## 2024-03-12 DIAGNOSIS — D631 Anemia in chronic kidney disease: Secondary | ICD-10-CM | POA: Diagnosis not present

## 2024-03-13 DIAGNOSIS — N2581 Secondary hyperparathyroidism of renal origin: Secondary | ICD-10-CM | POA: Diagnosis not present

## 2024-03-13 DIAGNOSIS — Z79899 Other long term (current) drug therapy: Secondary | ICD-10-CM | POA: Diagnosis not present

## 2024-03-13 DIAGNOSIS — D631 Anemia in chronic kidney disease: Secondary | ICD-10-CM | POA: Diagnosis not present

## 2024-03-13 DIAGNOSIS — D509 Iron deficiency anemia, unspecified: Secondary | ICD-10-CM | POA: Diagnosis not present

## 2024-03-13 DIAGNOSIS — E440 Moderate protein-calorie malnutrition: Secondary | ICD-10-CM | POA: Diagnosis not present

## 2024-03-13 DIAGNOSIS — Z4932 Encounter for adequacy testing for peritoneal dialysis: Secondary | ICD-10-CM | POA: Diagnosis not present

## 2024-03-13 DIAGNOSIS — N186 End stage renal disease: Secondary | ICD-10-CM | POA: Diagnosis not present

## 2024-03-13 DIAGNOSIS — Z992 Dependence on renal dialysis: Secondary | ICD-10-CM | POA: Diagnosis not present

## 2024-03-13 DIAGNOSIS — E878 Other disorders of electrolyte and fluid balance, not elsewhere classified: Secondary | ICD-10-CM | POA: Diagnosis not present

## 2024-03-13 DIAGNOSIS — K7689 Other specified diseases of liver: Secondary | ICD-10-CM | POA: Diagnosis not present

## 2024-03-14 DIAGNOSIS — E440 Moderate protein-calorie malnutrition: Secondary | ICD-10-CM | POA: Diagnosis not present

## 2024-03-14 DIAGNOSIS — Z992 Dependence on renal dialysis: Secondary | ICD-10-CM | POA: Diagnosis not present

## 2024-03-14 DIAGNOSIS — Z4932 Encounter for adequacy testing for peritoneal dialysis: Secondary | ICD-10-CM | POA: Diagnosis not present

## 2024-03-14 DIAGNOSIS — K7689 Other specified diseases of liver: Secondary | ICD-10-CM | POA: Diagnosis not present

## 2024-03-14 DIAGNOSIS — D631 Anemia in chronic kidney disease: Secondary | ICD-10-CM | POA: Diagnosis not present

## 2024-03-14 DIAGNOSIS — D509 Iron deficiency anemia, unspecified: Secondary | ICD-10-CM | POA: Diagnosis not present

## 2024-03-14 DIAGNOSIS — N2581 Secondary hyperparathyroidism of renal origin: Secondary | ICD-10-CM | POA: Diagnosis not present

## 2024-03-14 DIAGNOSIS — N186 End stage renal disease: Secondary | ICD-10-CM | POA: Diagnosis not present

## 2024-03-14 DIAGNOSIS — Z79899 Other long term (current) drug therapy: Secondary | ICD-10-CM | POA: Diagnosis not present

## 2024-03-14 DIAGNOSIS — E878 Other disorders of electrolyte and fluid balance, not elsewhere classified: Secondary | ICD-10-CM | POA: Diagnosis not present

## 2024-03-15 DIAGNOSIS — N2581 Secondary hyperparathyroidism of renal origin: Secondary | ICD-10-CM | POA: Diagnosis not present

## 2024-03-15 DIAGNOSIS — Z4932 Encounter for adequacy testing for peritoneal dialysis: Secondary | ICD-10-CM | POA: Diagnosis not present

## 2024-03-15 DIAGNOSIS — K7689 Other specified diseases of liver: Secondary | ICD-10-CM | POA: Diagnosis not present

## 2024-03-15 DIAGNOSIS — D509 Iron deficiency anemia, unspecified: Secondary | ICD-10-CM | POA: Diagnosis not present

## 2024-03-15 DIAGNOSIS — Z79899 Other long term (current) drug therapy: Secondary | ICD-10-CM | POA: Diagnosis not present

## 2024-03-15 DIAGNOSIS — E878 Other disorders of electrolyte and fluid balance, not elsewhere classified: Secondary | ICD-10-CM | POA: Diagnosis not present

## 2024-03-15 DIAGNOSIS — N186 End stage renal disease: Secondary | ICD-10-CM | POA: Diagnosis not present

## 2024-03-15 DIAGNOSIS — Z992 Dependence on renal dialysis: Secondary | ICD-10-CM | POA: Diagnosis not present

## 2024-03-15 DIAGNOSIS — E440 Moderate protein-calorie malnutrition: Secondary | ICD-10-CM | POA: Diagnosis not present

## 2024-03-15 DIAGNOSIS — D631 Anemia in chronic kidney disease: Secondary | ICD-10-CM | POA: Diagnosis not present

## 2024-03-16 DIAGNOSIS — D509 Iron deficiency anemia, unspecified: Secondary | ICD-10-CM | POA: Diagnosis not present

## 2024-03-16 DIAGNOSIS — Z4932 Encounter for adequacy testing for peritoneal dialysis: Secondary | ICD-10-CM | POA: Diagnosis not present

## 2024-03-16 DIAGNOSIS — N186 End stage renal disease: Secondary | ICD-10-CM | POA: Diagnosis not present

## 2024-03-16 DIAGNOSIS — Z992 Dependence on renal dialysis: Secondary | ICD-10-CM | POA: Diagnosis not present

## 2024-03-16 DIAGNOSIS — E878 Other disorders of electrolyte and fluid balance, not elsewhere classified: Secondary | ICD-10-CM | POA: Diagnosis not present

## 2024-03-16 DIAGNOSIS — N2581 Secondary hyperparathyroidism of renal origin: Secondary | ICD-10-CM | POA: Diagnosis not present

## 2024-03-16 DIAGNOSIS — E440 Moderate protein-calorie malnutrition: Secondary | ICD-10-CM | POA: Diagnosis not present

## 2024-03-16 DIAGNOSIS — K7689 Other specified diseases of liver: Secondary | ICD-10-CM | POA: Diagnosis not present

## 2024-03-16 DIAGNOSIS — D631 Anemia in chronic kidney disease: Secondary | ICD-10-CM | POA: Diagnosis not present

## 2024-03-16 DIAGNOSIS — Z79899 Other long term (current) drug therapy: Secondary | ICD-10-CM | POA: Diagnosis not present

## 2024-03-17 DIAGNOSIS — N2581 Secondary hyperparathyroidism of renal origin: Secondary | ICD-10-CM | POA: Diagnosis not present

## 2024-03-17 DIAGNOSIS — D509 Iron deficiency anemia, unspecified: Secondary | ICD-10-CM | POA: Diagnosis not present

## 2024-03-17 DIAGNOSIS — E878 Other disorders of electrolyte and fluid balance, not elsewhere classified: Secondary | ICD-10-CM | POA: Diagnosis not present

## 2024-03-17 DIAGNOSIS — Z79899 Other long term (current) drug therapy: Secondary | ICD-10-CM | POA: Diagnosis not present

## 2024-03-17 DIAGNOSIS — N186 End stage renal disease: Secondary | ICD-10-CM | POA: Diagnosis not present

## 2024-03-17 DIAGNOSIS — E440 Moderate protein-calorie malnutrition: Secondary | ICD-10-CM | POA: Diagnosis not present

## 2024-03-17 DIAGNOSIS — D631 Anemia in chronic kidney disease: Secondary | ICD-10-CM | POA: Diagnosis not present

## 2024-03-17 DIAGNOSIS — K7689 Other specified diseases of liver: Secondary | ICD-10-CM | POA: Diagnosis not present

## 2024-03-17 DIAGNOSIS — Z992 Dependence on renal dialysis: Secondary | ICD-10-CM | POA: Diagnosis not present

## 2024-03-17 DIAGNOSIS — Z4932 Encounter for adequacy testing for peritoneal dialysis: Secondary | ICD-10-CM | POA: Diagnosis not present

## 2024-03-18 DIAGNOSIS — E878 Other disorders of electrolyte and fluid balance, not elsewhere classified: Secondary | ICD-10-CM | POA: Diagnosis not present

## 2024-03-18 DIAGNOSIS — Z992 Dependence on renal dialysis: Secondary | ICD-10-CM | POA: Diagnosis not present

## 2024-03-18 DIAGNOSIS — Z79899 Other long term (current) drug therapy: Secondary | ICD-10-CM | POA: Diagnosis not present

## 2024-03-18 DIAGNOSIS — K7689 Other specified diseases of liver: Secondary | ICD-10-CM | POA: Diagnosis not present

## 2024-03-18 DIAGNOSIS — D631 Anemia in chronic kidney disease: Secondary | ICD-10-CM | POA: Diagnosis not present

## 2024-03-18 DIAGNOSIS — N186 End stage renal disease: Secondary | ICD-10-CM | POA: Diagnosis not present

## 2024-03-18 DIAGNOSIS — Z4932 Encounter for adequacy testing for peritoneal dialysis: Secondary | ICD-10-CM | POA: Diagnosis not present

## 2024-03-18 DIAGNOSIS — D509 Iron deficiency anemia, unspecified: Secondary | ICD-10-CM | POA: Diagnosis not present

## 2024-03-18 DIAGNOSIS — E440 Moderate protein-calorie malnutrition: Secondary | ICD-10-CM | POA: Diagnosis not present

## 2024-03-18 DIAGNOSIS — N2581 Secondary hyperparathyroidism of renal origin: Secondary | ICD-10-CM | POA: Diagnosis not present

## 2024-03-19 DIAGNOSIS — N186 End stage renal disease: Secondary | ICD-10-CM | POA: Diagnosis not present

## 2024-03-19 DIAGNOSIS — D509 Iron deficiency anemia, unspecified: Secondary | ICD-10-CM | POA: Diagnosis not present

## 2024-03-19 DIAGNOSIS — Z79899 Other long term (current) drug therapy: Secondary | ICD-10-CM | POA: Diagnosis not present

## 2024-03-19 DIAGNOSIS — D631 Anemia in chronic kidney disease: Secondary | ICD-10-CM | POA: Diagnosis not present

## 2024-03-19 DIAGNOSIS — Z992 Dependence on renal dialysis: Secondary | ICD-10-CM | POA: Diagnosis not present

## 2024-03-19 DIAGNOSIS — Z4932 Encounter for adequacy testing for peritoneal dialysis: Secondary | ICD-10-CM | POA: Diagnosis not present

## 2024-03-19 DIAGNOSIS — K7689 Other specified diseases of liver: Secondary | ICD-10-CM | POA: Diagnosis not present

## 2024-03-19 DIAGNOSIS — N2581 Secondary hyperparathyroidism of renal origin: Secondary | ICD-10-CM | POA: Diagnosis not present

## 2024-03-19 DIAGNOSIS — E878 Other disorders of electrolyte and fluid balance, not elsewhere classified: Secondary | ICD-10-CM | POA: Diagnosis not present

## 2024-03-19 DIAGNOSIS — E440 Moderate protein-calorie malnutrition: Secondary | ICD-10-CM | POA: Diagnosis not present

## 2024-03-20 DIAGNOSIS — E878 Other disorders of electrolyte and fluid balance, not elsewhere classified: Secondary | ICD-10-CM | POA: Diagnosis not present

## 2024-03-20 DIAGNOSIS — N2581 Secondary hyperparathyroidism of renal origin: Secondary | ICD-10-CM | POA: Diagnosis not present

## 2024-03-20 DIAGNOSIS — Z992 Dependence on renal dialysis: Secondary | ICD-10-CM | POA: Diagnosis not present

## 2024-03-20 DIAGNOSIS — E440 Moderate protein-calorie malnutrition: Secondary | ICD-10-CM | POA: Diagnosis not present

## 2024-03-20 DIAGNOSIS — Z79899 Other long term (current) drug therapy: Secondary | ICD-10-CM | POA: Diagnosis not present

## 2024-03-20 DIAGNOSIS — D631 Anemia in chronic kidney disease: Secondary | ICD-10-CM | POA: Diagnosis not present

## 2024-03-20 DIAGNOSIS — N186 End stage renal disease: Secondary | ICD-10-CM | POA: Diagnosis not present

## 2024-03-20 DIAGNOSIS — D509 Iron deficiency anemia, unspecified: Secondary | ICD-10-CM | POA: Diagnosis not present

## 2024-03-20 DIAGNOSIS — Z4932 Encounter for adequacy testing for peritoneal dialysis: Secondary | ICD-10-CM | POA: Diagnosis not present

## 2024-03-20 DIAGNOSIS — K7689 Other specified diseases of liver: Secondary | ICD-10-CM | POA: Diagnosis not present

## 2024-03-21 DIAGNOSIS — Z992 Dependence on renal dialysis: Secondary | ICD-10-CM | POA: Diagnosis not present

## 2024-03-21 DIAGNOSIS — N186 End stage renal disease: Secondary | ICD-10-CM | POA: Diagnosis not present

## 2024-03-21 DIAGNOSIS — E440 Moderate protein-calorie malnutrition: Secondary | ICD-10-CM | POA: Diagnosis not present

## 2024-03-21 DIAGNOSIS — E878 Other disorders of electrolyte and fluid balance, not elsewhere classified: Secondary | ICD-10-CM | POA: Diagnosis not present

## 2024-03-21 DIAGNOSIS — Z79899 Other long term (current) drug therapy: Secondary | ICD-10-CM | POA: Diagnosis not present

## 2024-03-21 DIAGNOSIS — D509 Iron deficiency anemia, unspecified: Secondary | ICD-10-CM | POA: Diagnosis not present

## 2024-03-21 DIAGNOSIS — K7689 Other specified diseases of liver: Secondary | ICD-10-CM | POA: Diagnosis not present

## 2024-03-21 DIAGNOSIS — N2581 Secondary hyperparathyroidism of renal origin: Secondary | ICD-10-CM | POA: Diagnosis not present

## 2024-03-21 DIAGNOSIS — D631 Anemia in chronic kidney disease: Secondary | ICD-10-CM | POA: Diagnosis not present

## 2024-03-21 DIAGNOSIS — Z4932 Encounter for adequacy testing for peritoneal dialysis: Secondary | ICD-10-CM | POA: Diagnosis not present

## 2024-03-22 DIAGNOSIS — E440 Moderate protein-calorie malnutrition: Secondary | ICD-10-CM | POA: Diagnosis not present

## 2024-03-22 DIAGNOSIS — N186 End stage renal disease: Secondary | ICD-10-CM | POA: Diagnosis not present

## 2024-03-22 DIAGNOSIS — Z4932 Encounter for adequacy testing for peritoneal dialysis: Secondary | ICD-10-CM | POA: Diagnosis not present

## 2024-03-22 DIAGNOSIS — Z79899 Other long term (current) drug therapy: Secondary | ICD-10-CM | POA: Diagnosis not present

## 2024-03-22 DIAGNOSIS — K7689 Other specified diseases of liver: Secondary | ICD-10-CM | POA: Diagnosis not present

## 2024-03-22 DIAGNOSIS — N2581 Secondary hyperparathyroidism of renal origin: Secondary | ICD-10-CM | POA: Diagnosis not present

## 2024-03-22 DIAGNOSIS — D509 Iron deficiency anemia, unspecified: Secondary | ICD-10-CM | POA: Diagnosis not present

## 2024-03-22 DIAGNOSIS — E878 Other disorders of electrolyte and fluid balance, not elsewhere classified: Secondary | ICD-10-CM | POA: Diagnosis not present

## 2024-03-22 DIAGNOSIS — Z992 Dependence on renal dialysis: Secondary | ICD-10-CM | POA: Diagnosis not present

## 2024-03-22 DIAGNOSIS — D631 Anemia in chronic kidney disease: Secondary | ICD-10-CM | POA: Diagnosis not present

## 2024-03-22 DIAGNOSIS — E1129 Type 2 diabetes mellitus with other diabetic kidney complication: Secondary | ICD-10-CM | POA: Diagnosis not present

## 2024-03-23 DIAGNOSIS — N2581 Secondary hyperparathyroidism of renal origin: Secondary | ICD-10-CM | POA: Diagnosis not present

## 2024-03-23 DIAGNOSIS — E440 Moderate protein-calorie malnutrition: Secondary | ICD-10-CM | POA: Diagnosis not present

## 2024-03-23 DIAGNOSIS — E878 Other disorders of electrolyte and fluid balance, not elsewhere classified: Secondary | ICD-10-CM | POA: Diagnosis not present

## 2024-03-23 DIAGNOSIS — Z79899 Other long term (current) drug therapy: Secondary | ICD-10-CM | POA: Diagnosis not present

## 2024-03-23 DIAGNOSIS — D509 Iron deficiency anemia, unspecified: Secondary | ICD-10-CM | POA: Diagnosis not present

## 2024-03-23 DIAGNOSIS — N186 End stage renal disease: Secondary | ICD-10-CM | POA: Diagnosis not present

## 2024-03-23 DIAGNOSIS — K7689 Other specified diseases of liver: Secondary | ICD-10-CM | POA: Diagnosis not present

## 2024-03-23 DIAGNOSIS — Z992 Dependence on renal dialysis: Secondary | ICD-10-CM | POA: Diagnosis not present

## 2024-03-23 DIAGNOSIS — Z4932 Encounter for adequacy testing for peritoneal dialysis: Secondary | ICD-10-CM | POA: Diagnosis not present

## 2024-03-23 DIAGNOSIS — D631 Anemia in chronic kidney disease: Secondary | ICD-10-CM | POA: Diagnosis not present

## 2024-03-23 DIAGNOSIS — N2589 Other disorders resulting from impaired renal tubular function: Secondary | ICD-10-CM | POA: Diagnosis not present

## 2024-03-23 DIAGNOSIS — E1122 Type 2 diabetes mellitus with diabetic chronic kidney disease: Secondary | ICD-10-CM | POA: Diagnosis not present

## 2024-03-24 DIAGNOSIS — Z4932 Encounter for adequacy testing for peritoneal dialysis: Secondary | ICD-10-CM | POA: Diagnosis not present

## 2024-03-24 DIAGNOSIS — Z79899 Other long term (current) drug therapy: Secondary | ICD-10-CM | POA: Diagnosis not present

## 2024-03-24 DIAGNOSIS — N186 End stage renal disease: Secondary | ICD-10-CM | POA: Diagnosis not present

## 2024-03-24 DIAGNOSIS — N2589 Other disorders resulting from impaired renal tubular function: Secondary | ICD-10-CM | POA: Diagnosis not present

## 2024-03-24 DIAGNOSIS — E878 Other disorders of electrolyte and fluid balance, not elsewhere classified: Secondary | ICD-10-CM | POA: Diagnosis not present

## 2024-03-24 DIAGNOSIS — E440 Moderate protein-calorie malnutrition: Secondary | ICD-10-CM | POA: Diagnosis not present

## 2024-03-24 DIAGNOSIS — N2581 Secondary hyperparathyroidism of renal origin: Secondary | ICD-10-CM | POA: Diagnosis not present

## 2024-03-24 DIAGNOSIS — E1122 Type 2 diabetes mellitus with diabetic chronic kidney disease: Secondary | ICD-10-CM | POA: Diagnosis not present

## 2024-03-24 DIAGNOSIS — Z992 Dependence on renal dialysis: Secondary | ICD-10-CM | POA: Diagnosis not present

## 2024-03-24 DIAGNOSIS — D509 Iron deficiency anemia, unspecified: Secondary | ICD-10-CM | POA: Diagnosis not present

## 2024-03-24 DIAGNOSIS — K7689 Other specified diseases of liver: Secondary | ICD-10-CM | POA: Diagnosis not present

## 2024-03-24 DIAGNOSIS — D631 Anemia in chronic kidney disease: Secondary | ICD-10-CM | POA: Diagnosis not present

## 2024-03-25 DIAGNOSIS — D509 Iron deficiency anemia, unspecified: Secondary | ICD-10-CM | POA: Diagnosis not present

## 2024-03-25 DIAGNOSIS — E878 Other disorders of electrolyte and fluid balance, not elsewhere classified: Secondary | ICD-10-CM | POA: Diagnosis not present

## 2024-03-25 DIAGNOSIS — E440 Moderate protein-calorie malnutrition: Secondary | ICD-10-CM | POA: Diagnosis not present

## 2024-03-25 DIAGNOSIS — K7689 Other specified diseases of liver: Secondary | ICD-10-CM | POA: Diagnosis not present

## 2024-03-25 DIAGNOSIS — D631 Anemia in chronic kidney disease: Secondary | ICD-10-CM | POA: Diagnosis not present

## 2024-03-25 DIAGNOSIS — Z992 Dependence on renal dialysis: Secondary | ICD-10-CM | POA: Diagnosis not present

## 2024-03-25 DIAGNOSIS — N186 End stage renal disease: Secondary | ICD-10-CM | POA: Diagnosis not present

## 2024-03-25 DIAGNOSIS — N2581 Secondary hyperparathyroidism of renal origin: Secondary | ICD-10-CM | POA: Diagnosis not present

## 2024-03-25 DIAGNOSIS — Z79899 Other long term (current) drug therapy: Secondary | ICD-10-CM | POA: Diagnosis not present

## 2024-03-25 DIAGNOSIS — Z4932 Encounter for adequacy testing for peritoneal dialysis: Secondary | ICD-10-CM | POA: Diagnosis not present

## 2024-03-25 DIAGNOSIS — E1122 Type 2 diabetes mellitus with diabetic chronic kidney disease: Secondary | ICD-10-CM | POA: Diagnosis not present

## 2024-03-25 DIAGNOSIS — N2589 Other disorders resulting from impaired renal tubular function: Secondary | ICD-10-CM | POA: Diagnosis not present

## 2024-03-26 DIAGNOSIS — Z992 Dependence on renal dialysis: Secondary | ICD-10-CM | POA: Diagnosis not present

## 2024-03-26 DIAGNOSIS — K7689 Other specified diseases of liver: Secondary | ICD-10-CM | POA: Diagnosis not present

## 2024-03-26 DIAGNOSIS — N2589 Other disorders resulting from impaired renal tubular function: Secondary | ICD-10-CM | POA: Diagnosis not present

## 2024-03-26 DIAGNOSIS — N2581 Secondary hyperparathyroidism of renal origin: Secondary | ICD-10-CM | POA: Diagnosis not present

## 2024-03-26 DIAGNOSIS — D631 Anemia in chronic kidney disease: Secondary | ICD-10-CM | POA: Diagnosis not present

## 2024-03-26 DIAGNOSIS — E440 Moderate protein-calorie malnutrition: Secondary | ICD-10-CM | POA: Diagnosis not present

## 2024-03-26 DIAGNOSIS — Z79899 Other long term (current) drug therapy: Secondary | ICD-10-CM | POA: Diagnosis not present

## 2024-03-26 DIAGNOSIS — Z4932 Encounter for adequacy testing for peritoneal dialysis: Secondary | ICD-10-CM | POA: Diagnosis not present

## 2024-03-26 DIAGNOSIS — D509 Iron deficiency anemia, unspecified: Secondary | ICD-10-CM | POA: Diagnosis not present

## 2024-03-26 DIAGNOSIS — E1122 Type 2 diabetes mellitus with diabetic chronic kidney disease: Secondary | ICD-10-CM | POA: Diagnosis not present

## 2024-03-26 DIAGNOSIS — E878 Other disorders of electrolyte and fluid balance, not elsewhere classified: Secondary | ICD-10-CM | POA: Diagnosis not present

## 2024-03-26 DIAGNOSIS — N186 End stage renal disease: Secondary | ICD-10-CM | POA: Diagnosis not present

## 2024-03-27 DIAGNOSIS — D509 Iron deficiency anemia, unspecified: Secondary | ICD-10-CM | POA: Diagnosis not present

## 2024-03-27 DIAGNOSIS — N2581 Secondary hyperparathyroidism of renal origin: Secondary | ICD-10-CM | POA: Diagnosis not present

## 2024-03-27 DIAGNOSIS — E440 Moderate protein-calorie malnutrition: Secondary | ICD-10-CM | POA: Diagnosis not present

## 2024-03-27 DIAGNOSIS — Z79899 Other long term (current) drug therapy: Secondary | ICD-10-CM | POA: Diagnosis not present

## 2024-03-27 DIAGNOSIS — Z992 Dependence on renal dialysis: Secondary | ICD-10-CM | POA: Diagnosis not present

## 2024-03-27 DIAGNOSIS — Z4932 Encounter for adequacy testing for peritoneal dialysis: Secondary | ICD-10-CM | POA: Diagnosis not present

## 2024-03-27 DIAGNOSIS — E878 Other disorders of electrolyte and fluid balance, not elsewhere classified: Secondary | ICD-10-CM | POA: Diagnosis not present

## 2024-03-27 DIAGNOSIS — N186 End stage renal disease: Secondary | ICD-10-CM | POA: Diagnosis not present

## 2024-03-27 DIAGNOSIS — D631 Anemia in chronic kidney disease: Secondary | ICD-10-CM | POA: Diagnosis not present

## 2024-03-27 DIAGNOSIS — E1122 Type 2 diabetes mellitus with diabetic chronic kidney disease: Secondary | ICD-10-CM | POA: Diagnosis not present

## 2024-03-27 DIAGNOSIS — K7689 Other specified diseases of liver: Secondary | ICD-10-CM | POA: Diagnosis not present

## 2024-03-27 DIAGNOSIS — N2589 Other disorders resulting from impaired renal tubular function: Secondary | ICD-10-CM | POA: Diagnosis not present

## 2024-03-28 DIAGNOSIS — Z79899 Other long term (current) drug therapy: Secondary | ICD-10-CM | POA: Diagnosis not present

## 2024-03-28 DIAGNOSIS — E440 Moderate protein-calorie malnutrition: Secondary | ICD-10-CM | POA: Diagnosis not present

## 2024-03-28 DIAGNOSIS — D509 Iron deficiency anemia, unspecified: Secondary | ICD-10-CM | POA: Diagnosis not present

## 2024-03-28 DIAGNOSIS — Z992 Dependence on renal dialysis: Secondary | ICD-10-CM | POA: Diagnosis not present

## 2024-03-28 DIAGNOSIS — K7689 Other specified diseases of liver: Secondary | ICD-10-CM | POA: Diagnosis not present

## 2024-03-28 DIAGNOSIS — N2581 Secondary hyperparathyroidism of renal origin: Secondary | ICD-10-CM | POA: Diagnosis not present

## 2024-03-28 DIAGNOSIS — E1122 Type 2 diabetes mellitus with diabetic chronic kidney disease: Secondary | ICD-10-CM | POA: Diagnosis not present

## 2024-03-28 DIAGNOSIS — D631 Anemia in chronic kidney disease: Secondary | ICD-10-CM | POA: Diagnosis not present

## 2024-03-28 DIAGNOSIS — N2589 Other disorders resulting from impaired renal tubular function: Secondary | ICD-10-CM | POA: Diagnosis not present

## 2024-03-28 DIAGNOSIS — Z4932 Encounter for adequacy testing for peritoneal dialysis: Secondary | ICD-10-CM | POA: Diagnosis not present

## 2024-03-28 DIAGNOSIS — E878 Other disorders of electrolyte and fluid balance, not elsewhere classified: Secondary | ICD-10-CM | POA: Diagnosis not present

## 2024-03-28 DIAGNOSIS — N186 End stage renal disease: Secondary | ICD-10-CM | POA: Diagnosis not present

## 2024-03-29 DIAGNOSIS — K7689 Other specified diseases of liver: Secondary | ICD-10-CM | POA: Diagnosis not present

## 2024-03-29 DIAGNOSIS — E878 Other disorders of electrolyte and fluid balance, not elsewhere classified: Secondary | ICD-10-CM | POA: Diagnosis not present

## 2024-03-29 DIAGNOSIS — Z4932 Encounter for adequacy testing for peritoneal dialysis: Secondary | ICD-10-CM | POA: Diagnosis not present

## 2024-03-29 DIAGNOSIS — E1122 Type 2 diabetes mellitus with diabetic chronic kidney disease: Secondary | ICD-10-CM | POA: Diagnosis not present

## 2024-03-29 DIAGNOSIS — N2589 Other disorders resulting from impaired renal tubular function: Secondary | ICD-10-CM | POA: Diagnosis not present

## 2024-03-29 DIAGNOSIS — Z79899 Other long term (current) drug therapy: Secondary | ICD-10-CM | POA: Diagnosis not present

## 2024-03-29 DIAGNOSIS — E440 Moderate protein-calorie malnutrition: Secondary | ICD-10-CM | POA: Diagnosis not present

## 2024-03-29 DIAGNOSIS — N2581 Secondary hyperparathyroidism of renal origin: Secondary | ICD-10-CM | POA: Diagnosis not present

## 2024-03-29 DIAGNOSIS — N186 End stage renal disease: Secondary | ICD-10-CM | POA: Diagnosis not present

## 2024-03-29 DIAGNOSIS — D509 Iron deficiency anemia, unspecified: Secondary | ICD-10-CM | POA: Diagnosis not present

## 2024-03-29 DIAGNOSIS — D631 Anemia in chronic kidney disease: Secondary | ICD-10-CM | POA: Diagnosis not present

## 2024-03-29 DIAGNOSIS — Z992 Dependence on renal dialysis: Secondary | ICD-10-CM | POA: Diagnosis not present

## 2024-03-30 DIAGNOSIS — E1122 Type 2 diabetes mellitus with diabetic chronic kidney disease: Secondary | ICD-10-CM | POA: Diagnosis not present

## 2024-03-30 DIAGNOSIS — Z992 Dependence on renal dialysis: Secondary | ICD-10-CM | POA: Diagnosis not present

## 2024-03-30 DIAGNOSIS — D509 Iron deficiency anemia, unspecified: Secondary | ICD-10-CM | POA: Diagnosis not present

## 2024-03-30 DIAGNOSIS — K7689 Other specified diseases of liver: Secondary | ICD-10-CM | POA: Diagnosis not present

## 2024-03-30 DIAGNOSIS — Z79899 Other long term (current) drug therapy: Secondary | ICD-10-CM | POA: Diagnosis not present

## 2024-03-30 DIAGNOSIS — E440 Moderate protein-calorie malnutrition: Secondary | ICD-10-CM | POA: Diagnosis not present

## 2024-03-30 DIAGNOSIS — D631 Anemia in chronic kidney disease: Secondary | ICD-10-CM | POA: Diagnosis not present

## 2024-03-30 DIAGNOSIS — N2581 Secondary hyperparathyroidism of renal origin: Secondary | ICD-10-CM | POA: Diagnosis not present

## 2024-03-30 DIAGNOSIS — E878 Other disorders of electrolyte and fluid balance, not elsewhere classified: Secondary | ICD-10-CM | POA: Diagnosis not present

## 2024-03-30 DIAGNOSIS — N186 End stage renal disease: Secondary | ICD-10-CM | POA: Diagnosis not present

## 2024-03-30 DIAGNOSIS — N2589 Other disorders resulting from impaired renal tubular function: Secondary | ICD-10-CM | POA: Diagnosis not present

## 2024-03-30 DIAGNOSIS — Z4932 Encounter for adequacy testing for peritoneal dialysis: Secondary | ICD-10-CM | POA: Diagnosis not present

## 2024-03-31 DIAGNOSIS — E878 Other disorders of electrolyte and fluid balance, not elsewhere classified: Secondary | ICD-10-CM | POA: Diagnosis not present

## 2024-03-31 DIAGNOSIS — G4733 Obstructive sleep apnea (adult) (pediatric): Secondary | ICD-10-CM | POA: Diagnosis not present

## 2024-03-31 DIAGNOSIS — J9612 Chronic respiratory failure with hypercapnia: Secondary | ICD-10-CM | POA: Diagnosis not present

## 2024-03-31 DIAGNOSIS — K7689 Other specified diseases of liver: Secondary | ICD-10-CM | POA: Diagnosis not present

## 2024-03-31 DIAGNOSIS — D631 Anemia in chronic kidney disease: Secondary | ICD-10-CM | POA: Diagnosis not present

## 2024-03-31 DIAGNOSIS — Z79899 Other long term (current) drug therapy: Secondary | ICD-10-CM | POA: Diagnosis not present

## 2024-03-31 DIAGNOSIS — N2581 Secondary hyperparathyroidism of renal origin: Secondary | ICD-10-CM | POA: Diagnosis not present

## 2024-03-31 DIAGNOSIS — D509 Iron deficiency anemia, unspecified: Secondary | ICD-10-CM | POA: Diagnosis not present

## 2024-03-31 DIAGNOSIS — N2589 Other disorders resulting from impaired renal tubular function: Secondary | ICD-10-CM | POA: Diagnosis not present

## 2024-03-31 DIAGNOSIS — Z4932 Encounter for adequacy testing for peritoneal dialysis: Secondary | ICD-10-CM | POA: Diagnosis not present

## 2024-03-31 DIAGNOSIS — J9610 Chronic respiratory failure, unspecified whether with hypoxia or hypercapnia: Secondary | ICD-10-CM | POA: Diagnosis not present

## 2024-03-31 DIAGNOSIS — E440 Moderate protein-calorie malnutrition: Secondary | ICD-10-CM | POA: Diagnosis not present

## 2024-03-31 DIAGNOSIS — Z992 Dependence on renal dialysis: Secondary | ICD-10-CM | POA: Diagnosis not present

## 2024-03-31 DIAGNOSIS — E1122 Type 2 diabetes mellitus with diabetic chronic kidney disease: Secondary | ICD-10-CM | POA: Diagnosis not present

## 2024-03-31 DIAGNOSIS — N186 End stage renal disease: Secondary | ICD-10-CM | POA: Diagnosis not present

## 2024-04-01 DIAGNOSIS — E878 Other disorders of electrolyte and fluid balance, not elsewhere classified: Secondary | ICD-10-CM | POA: Diagnosis not present

## 2024-04-01 DIAGNOSIS — D631 Anemia in chronic kidney disease: Secondary | ICD-10-CM | POA: Diagnosis not present

## 2024-04-01 DIAGNOSIS — E440 Moderate protein-calorie malnutrition: Secondary | ICD-10-CM | POA: Diagnosis not present

## 2024-04-01 DIAGNOSIS — D509 Iron deficiency anemia, unspecified: Secondary | ICD-10-CM | POA: Diagnosis not present

## 2024-04-01 DIAGNOSIS — Z4932 Encounter for adequacy testing for peritoneal dialysis: Secondary | ICD-10-CM | POA: Diagnosis not present

## 2024-04-01 DIAGNOSIS — N2589 Other disorders resulting from impaired renal tubular function: Secondary | ICD-10-CM | POA: Diagnosis not present

## 2024-04-01 DIAGNOSIS — Z992 Dependence on renal dialysis: Secondary | ICD-10-CM | POA: Diagnosis not present

## 2024-04-01 DIAGNOSIS — N2581 Secondary hyperparathyroidism of renal origin: Secondary | ICD-10-CM | POA: Diagnosis not present

## 2024-04-01 DIAGNOSIS — K7689 Other specified diseases of liver: Secondary | ICD-10-CM | POA: Diagnosis not present

## 2024-04-01 DIAGNOSIS — N186 End stage renal disease: Secondary | ICD-10-CM | POA: Diagnosis not present

## 2024-04-01 DIAGNOSIS — E1122 Type 2 diabetes mellitus with diabetic chronic kidney disease: Secondary | ICD-10-CM | POA: Diagnosis not present

## 2024-04-01 DIAGNOSIS — Z79899 Other long term (current) drug therapy: Secondary | ICD-10-CM | POA: Diagnosis not present

## 2024-04-02 DIAGNOSIS — Z79899 Other long term (current) drug therapy: Secondary | ICD-10-CM | POA: Diagnosis not present

## 2024-04-02 DIAGNOSIS — Z992 Dependence on renal dialysis: Secondary | ICD-10-CM | POA: Diagnosis not present

## 2024-04-02 DIAGNOSIS — K7689 Other specified diseases of liver: Secondary | ICD-10-CM | POA: Diagnosis not present

## 2024-04-02 DIAGNOSIS — E1122 Type 2 diabetes mellitus with diabetic chronic kidney disease: Secondary | ICD-10-CM | POA: Diagnosis not present

## 2024-04-02 DIAGNOSIS — N2589 Other disorders resulting from impaired renal tubular function: Secondary | ICD-10-CM | POA: Diagnosis not present

## 2024-04-02 DIAGNOSIS — N2581 Secondary hyperparathyroidism of renal origin: Secondary | ICD-10-CM | POA: Diagnosis not present

## 2024-04-02 DIAGNOSIS — D631 Anemia in chronic kidney disease: Secondary | ICD-10-CM | POA: Diagnosis not present

## 2024-04-02 DIAGNOSIS — N186 End stage renal disease: Secondary | ICD-10-CM | POA: Diagnosis not present

## 2024-04-02 DIAGNOSIS — D509 Iron deficiency anemia, unspecified: Secondary | ICD-10-CM | POA: Diagnosis not present

## 2024-04-02 DIAGNOSIS — E878 Other disorders of electrolyte and fluid balance, not elsewhere classified: Secondary | ICD-10-CM | POA: Diagnosis not present

## 2024-04-02 DIAGNOSIS — Z4932 Encounter for adequacy testing for peritoneal dialysis: Secondary | ICD-10-CM | POA: Diagnosis not present

## 2024-04-02 DIAGNOSIS — E440 Moderate protein-calorie malnutrition: Secondary | ICD-10-CM | POA: Diagnosis not present

## 2024-04-03 DIAGNOSIS — N186 End stage renal disease: Secondary | ICD-10-CM | POA: Diagnosis not present

## 2024-04-03 DIAGNOSIS — E440 Moderate protein-calorie malnutrition: Secondary | ICD-10-CM | POA: Diagnosis not present

## 2024-04-03 DIAGNOSIS — Z992 Dependence on renal dialysis: Secondary | ICD-10-CM | POA: Diagnosis not present

## 2024-04-03 DIAGNOSIS — D509 Iron deficiency anemia, unspecified: Secondary | ICD-10-CM | POA: Diagnosis not present

## 2024-04-03 DIAGNOSIS — N2589 Other disorders resulting from impaired renal tubular function: Secondary | ICD-10-CM | POA: Diagnosis not present

## 2024-04-03 DIAGNOSIS — D631 Anemia in chronic kidney disease: Secondary | ICD-10-CM | POA: Diagnosis not present

## 2024-04-03 DIAGNOSIS — E1122 Type 2 diabetes mellitus with diabetic chronic kidney disease: Secondary | ICD-10-CM | POA: Diagnosis not present

## 2024-04-03 DIAGNOSIS — Z79899 Other long term (current) drug therapy: Secondary | ICD-10-CM | POA: Diagnosis not present

## 2024-04-03 DIAGNOSIS — N2581 Secondary hyperparathyroidism of renal origin: Secondary | ICD-10-CM | POA: Diagnosis not present

## 2024-04-03 DIAGNOSIS — E878 Other disorders of electrolyte and fluid balance, not elsewhere classified: Secondary | ICD-10-CM | POA: Diagnosis not present

## 2024-04-03 DIAGNOSIS — K7689 Other specified diseases of liver: Secondary | ICD-10-CM | POA: Diagnosis not present

## 2024-04-03 DIAGNOSIS — Z4932 Encounter for adequacy testing for peritoneal dialysis: Secondary | ICD-10-CM | POA: Diagnosis not present

## 2024-04-04 DIAGNOSIS — Z4932 Encounter for adequacy testing for peritoneal dialysis: Secondary | ICD-10-CM | POA: Diagnosis not present

## 2024-04-04 DIAGNOSIS — E878 Other disorders of electrolyte and fluid balance, not elsewhere classified: Secondary | ICD-10-CM | POA: Diagnosis not present

## 2024-04-04 DIAGNOSIS — N186 End stage renal disease: Secondary | ICD-10-CM | POA: Diagnosis not present

## 2024-04-04 DIAGNOSIS — Z992 Dependence on renal dialysis: Secondary | ICD-10-CM | POA: Diagnosis not present

## 2024-04-04 DIAGNOSIS — Z79899 Other long term (current) drug therapy: Secondary | ICD-10-CM | POA: Diagnosis not present

## 2024-04-04 DIAGNOSIS — E1122 Type 2 diabetes mellitus with diabetic chronic kidney disease: Secondary | ICD-10-CM | POA: Diagnosis not present

## 2024-04-04 DIAGNOSIS — E440 Moderate protein-calorie malnutrition: Secondary | ICD-10-CM | POA: Diagnosis not present

## 2024-04-04 DIAGNOSIS — K7689 Other specified diseases of liver: Secondary | ICD-10-CM | POA: Diagnosis not present

## 2024-04-04 DIAGNOSIS — D509 Iron deficiency anemia, unspecified: Secondary | ICD-10-CM | POA: Diagnosis not present

## 2024-04-04 DIAGNOSIS — N2581 Secondary hyperparathyroidism of renal origin: Secondary | ICD-10-CM | POA: Diagnosis not present

## 2024-04-04 DIAGNOSIS — N2589 Other disorders resulting from impaired renal tubular function: Secondary | ICD-10-CM | POA: Diagnosis not present

## 2024-04-04 DIAGNOSIS — D631 Anemia in chronic kidney disease: Secondary | ICD-10-CM | POA: Diagnosis not present

## 2024-04-05 DIAGNOSIS — E1122 Type 2 diabetes mellitus with diabetic chronic kidney disease: Secondary | ICD-10-CM | POA: Diagnosis not present

## 2024-04-05 DIAGNOSIS — D509 Iron deficiency anemia, unspecified: Secondary | ICD-10-CM | POA: Diagnosis not present

## 2024-04-05 DIAGNOSIS — Z992 Dependence on renal dialysis: Secondary | ICD-10-CM | POA: Diagnosis not present

## 2024-04-05 DIAGNOSIS — D631 Anemia in chronic kidney disease: Secondary | ICD-10-CM | POA: Diagnosis not present

## 2024-04-05 DIAGNOSIS — K7689 Other specified diseases of liver: Secondary | ICD-10-CM | POA: Diagnosis not present

## 2024-04-05 DIAGNOSIS — E440 Moderate protein-calorie malnutrition: Secondary | ICD-10-CM | POA: Diagnosis not present

## 2024-04-05 DIAGNOSIS — N2581 Secondary hyperparathyroidism of renal origin: Secondary | ICD-10-CM | POA: Diagnosis not present

## 2024-04-05 DIAGNOSIS — N186 End stage renal disease: Secondary | ICD-10-CM | POA: Diagnosis not present

## 2024-04-05 DIAGNOSIS — Z79899 Other long term (current) drug therapy: Secondary | ICD-10-CM | POA: Diagnosis not present

## 2024-04-05 DIAGNOSIS — N2589 Other disorders resulting from impaired renal tubular function: Secondary | ICD-10-CM | POA: Diagnosis not present

## 2024-04-05 DIAGNOSIS — E878 Other disorders of electrolyte and fluid balance, not elsewhere classified: Secondary | ICD-10-CM | POA: Diagnosis not present

## 2024-04-05 DIAGNOSIS — Z4932 Encounter for adequacy testing for peritoneal dialysis: Secondary | ICD-10-CM | POA: Diagnosis not present

## 2024-04-06 DIAGNOSIS — E440 Moderate protein-calorie malnutrition: Secondary | ICD-10-CM | POA: Diagnosis not present

## 2024-04-06 DIAGNOSIS — E1122 Type 2 diabetes mellitus with diabetic chronic kidney disease: Secondary | ICD-10-CM | POA: Diagnosis not present

## 2024-04-06 DIAGNOSIS — E878 Other disorders of electrolyte and fluid balance, not elsewhere classified: Secondary | ICD-10-CM | POA: Diagnosis not present

## 2024-04-06 DIAGNOSIS — Z4932 Encounter for adequacy testing for peritoneal dialysis: Secondary | ICD-10-CM | POA: Diagnosis not present

## 2024-04-06 DIAGNOSIS — N2589 Other disorders resulting from impaired renal tubular function: Secondary | ICD-10-CM | POA: Diagnosis not present

## 2024-04-06 DIAGNOSIS — D509 Iron deficiency anemia, unspecified: Secondary | ICD-10-CM | POA: Diagnosis not present

## 2024-04-06 DIAGNOSIS — N186 End stage renal disease: Secondary | ICD-10-CM | POA: Diagnosis not present

## 2024-04-06 DIAGNOSIS — N2581 Secondary hyperparathyroidism of renal origin: Secondary | ICD-10-CM | POA: Diagnosis not present

## 2024-04-06 DIAGNOSIS — Z79899 Other long term (current) drug therapy: Secondary | ICD-10-CM | POA: Diagnosis not present

## 2024-04-06 DIAGNOSIS — Z992 Dependence on renal dialysis: Secondary | ICD-10-CM | POA: Diagnosis not present

## 2024-04-06 DIAGNOSIS — D631 Anemia in chronic kidney disease: Secondary | ICD-10-CM | POA: Diagnosis not present

## 2024-04-06 DIAGNOSIS — K7689 Other specified diseases of liver: Secondary | ICD-10-CM | POA: Diagnosis not present

## 2024-04-07 DIAGNOSIS — E1122 Type 2 diabetes mellitus with diabetic chronic kidney disease: Secondary | ICD-10-CM | POA: Diagnosis not present

## 2024-04-07 DIAGNOSIS — Z992 Dependence on renal dialysis: Secondary | ICD-10-CM | POA: Diagnosis not present

## 2024-04-07 DIAGNOSIS — D509 Iron deficiency anemia, unspecified: Secondary | ICD-10-CM | POA: Diagnosis not present

## 2024-04-07 DIAGNOSIS — E878 Other disorders of electrolyte and fluid balance, not elsewhere classified: Secondary | ICD-10-CM | POA: Diagnosis not present

## 2024-04-07 DIAGNOSIS — Z79899 Other long term (current) drug therapy: Secondary | ICD-10-CM | POA: Diagnosis not present

## 2024-04-07 DIAGNOSIS — Z4932 Encounter for adequacy testing for peritoneal dialysis: Secondary | ICD-10-CM | POA: Diagnosis not present

## 2024-04-07 DIAGNOSIS — N186 End stage renal disease: Secondary | ICD-10-CM | POA: Diagnosis not present

## 2024-04-07 DIAGNOSIS — N2581 Secondary hyperparathyroidism of renal origin: Secondary | ICD-10-CM | POA: Diagnosis not present

## 2024-04-07 DIAGNOSIS — K7689 Other specified diseases of liver: Secondary | ICD-10-CM | POA: Diagnosis not present

## 2024-04-07 DIAGNOSIS — E440 Moderate protein-calorie malnutrition: Secondary | ICD-10-CM | POA: Diagnosis not present

## 2024-04-07 DIAGNOSIS — N2589 Other disorders resulting from impaired renal tubular function: Secondary | ICD-10-CM | POA: Diagnosis not present

## 2024-04-07 DIAGNOSIS — D631 Anemia in chronic kidney disease: Secondary | ICD-10-CM | POA: Diagnosis not present

## 2024-04-08 DIAGNOSIS — Z79899 Other long term (current) drug therapy: Secondary | ICD-10-CM | POA: Diagnosis not present

## 2024-04-08 DIAGNOSIS — E878 Other disorders of electrolyte and fluid balance, not elsewhere classified: Secondary | ICD-10-CM | POA: Diagnosis not present

## 2024-04-08 DIAGNOSIS — D509 Iron deficiency anemia, unspecified: Secondary | ICD-10-CM | POA: Diagnosis not present

## 2024-04-08 DIAGNOSIS — N2589 Other disorders resulting from impaired renal tubular function: Secondary | ICD-10-CM | POA: Diagnosis not present

## 2024-04-08 DIAGNOSIS — N2581 Secondary hyperparathyroidism of renal origin: Secondary | ICD-10-CM | POA: Diagnosis not present

## 2024-04-08 DIAGNOSIS — E1122 Type 2 diabetes mellitus with diabetic chronic kidney disease: Secondary | ICD-10-CM | POA: Diagnosis not present

## 2024-04-08 DIAGNOSIS — Z4932 Encounter for adequacy testing for peritoneal dialysis: Secondary | ICD-10-CM | POA: Diagnosis not present

## 2024-04-08 DIAGNOSIS — N186 End stage renal disease: Secondary | ICD-10-CM | POA: Diagnosis not present

## 2024-04-08 DIAGNOSIS — K7689 Other specified diseases of liver: Secondary | ICD-10-CM | POA: Diagnosis not present

## 2024-04-08 DIAGNOSIS — Z992 Dependence on renal dialysis: Secondary | ICD-10-CM | POA: Diagnosis not present

## 2024-04-08 DIAGNOSIS — D631 Anemia in chronic kidney disease: Secondary | ICD-10-CM | POA: Diagnosis not present

## 2024-04-08 DIAGNOSIS — E440 Moderate protein-calorie malnutrition: Secondary | ICD-10-CM | POA: Diagnosis not present

## 2024-04-09 DIAGNOSIS — E1122 Type 2 diabetes mellitus with diabetic chronic kidney disease: Secondary | ICD-10-CM | POA: Diagnosis not present

## 2024-04-09 DIAGNOSIS — N2589 Other disorders resulting from impaired renal tubular function: Secondary | ICD-10-CM | POA: Diagnosis not present

## 2024-04-09 DIAGNOSIS — Z4932 Encounter for adequacy testing for peritoneal dialysis: Secondary | ICD-10-CM | POA: Diagnosis not present

## 2024-04-09 DIAGNOSIS — N2581 Secondary hyperparathyroidism of renal origin: Secondary | ICD-10-CM | POA: Diagnosis not present

## 2024-04-09 DIAGNOSIS — E878 Other disorders of electrolyte and fluid balance, not elsewhere classified: Secondary | ICD-10-CM | POA: Diagnosis not present

## 2024-04-09 DIAGNOSIS — D509 Iron deficiency anemia, unspecified: Secondary | ICD-10-CM | POA: Diagnosis not present

## 2024-04-09 DIAGNOSIS — Z992 Dependence on renal dialysis: Secondary | ICD-10-CM | POA: Diagnosis not present

## 2024-04-09 DIAGNOSIS — E440 Moderate protein-calorie malnutrition: Secondary | ICD-10-CM | POA: Diagnosis not present

## 2024-04-09 DIAGNOSIS — Z79899 Other long term (current) drug therapy: Secondary | ICD-10-CM | POA: Diagnosis not present

## 2024-04-09 DIAGNOSIS — N186 End stage renal disease: Secondary | ICD-10-CM | POA: Diagnosis not present

## 2024-04-09 DIAGNOSIS — K7689 Other specified diseases of liver: Secondary | ICD-10-CM | POA: Diagnosis not present

## 2024-04-09 DIAGNOSIS — D631 Anemia in chronic kidney disease: Secondary | ICD-10-CM | POA: Diagnosis not present

## 2024-04-10 DIAGNOSIS — K7689 Other specified diseases of liver: Secondary | ICD-10-CM | POA: Diagnosis not present

## 2024-04-10 DIAGNOSIS — N186 End stage renal disease: Secondary | ICD-10-CM | POA: Diagnosis not present

## 2024-04-10 DIAGNOSIS — D631 Anemia in chronic kidney disease: Secondary | ICD-10-CM | POA: Diagnosis not present

## 2024-04-10 DIAGNOSIS — Z992 Dependence on renal dialysis: Secondary | ICD-10-CM | POA: Diagnosis not present

## 2024-04-10 DIAGNOSIS — D509 Iron deficiency anemia, unspecified: Secondary | ICD-10-CM | POA: Diagnosis not present

## 2024-04-10 DIAGNOSIS — E440 Moderate protein-calorie malnutrition: Secondary | ICD-10-CM | POA: Diagnosis not present

## 2024-04-10 DIAGNOSIS — N2581 Secondary hyperparathyroidism of renal origin: Secondary | ICD-10-CM | POA: Diagnosis not present

## 2024-04-10 DIAGNOSIS — N2589 Other disorders resulting from impaired renal tubular function: Secondary | ICD-10-CM | POA: Diagnosis not present

## 2024-04-10 DIAGNOSIS — E1122 Type 2 diabetes mellitus with diabetic chronic kidney disease: Secondary | ICD-10-CM | POA: Diagnosis not present

## 2024-04-10 DIAGNOSIS — Z4932 Encounter for adequacy testing for peritoneal dialysis: Secondary | ICD-10-CM | POA: Diagnosis not present

## 2024-04-10 DIAGNOSIS — Z79899 Other long term (current) drug therapy: Secondary | ICD-10-CM | POA: Diagnosis not present

## 2024-04-10 DIAGNOSIS — E878 Other disorders of electrolyte and fluid balance, not elsewhere classified: Secondary | ICD-10-CM | POA: Diagnosis not present

## 2024-04-11 DIAGNOSIS — N2581 Secondary hyperparathyroidism of renal origin: Secondary | ICD-10-CM | POA: Diagnosis not present

## 2024-04-11 DIAGNOSIS — D509 Iron deficiency anemia, unspecified: Secondary | ICD-10-CM | POA: Diagnosis not present

## 2024-04-11 DIAGNOSIS — Z4932 Encounter for adequacy testing for peritoneal dialysis: Secondary | ICD-10-CM | POA: Diagnosis not present

## 2024-04-11 DIAGNOSIS — Z992 Dependence on renal dialysis: Secondary | ICD-10-CM | POA: Diagnosis not present

## 2024-04-11 DIAGNOSIS — N2589 Other disorders resulting from impaired renal tubular function: Secondary | ICD-10-CM | POA: Diagnosis not present

## 2024-04-11 DIAGNOSIS — E878 Other disorders of electrolyte and fluid balance, not elsewhere classified: Secondary | ICD-10-CM | POA: Diagnosis not present

## 2024-04-11 DIAGNOSIS — E440 Moderate protein-calorie malnutrition: Secondary | ICD-10-CM | POA: Diagnosis not present

## 2024-04-11 DIAGNOSIS — Z79899 Other long term (current) drug therapy: Secondary | ICD-10-CM | POA: Diagnosis not present

## 2024-04-11 DIAGNOSIS — K7689 Other specified diseases of liver: Secondary | ICD-10-CM | POA: Diagnosis not present

## 2024-04-11 DIAGNOSIS — E1122 Type 2 diabetes mellitus with diabetic chronic kidney disease: Secondary | ICD-10-CM | POA: Diagnosis not present

## 2024-04-11 DIAGNOSIS — D631 Anemia in chronic kidney disease: Secondary | ICD-10-CM | POA: Diagnosis not present

## 2024-04-11 DIAGNOSIS — N186 End stage renal disease: Secondary | ICD-10-CM | POA: Diagnosis not present

## 2024-04-12 DIAGNOSIS — N186 End stage renal disease: Secondary | ICD-10-CM | POA: Diagnosis not present

## 2024-04-12 DIAGNOSIS — N2581 Secondary hyperparathyroidism of renal origin: Secondary | ICD-10-CM | POA: Diagnosis not present

## 2024-04-12 DIAGNOSIS — Z4932 Encounter for adequacy testing for peritoneal dialysis: Secondary | ICD-10-CM | POA: Diagnosis not present

## 2024-04-12 DIAGNOSIS — E878 Other disorders of electrolyte and fluid balance, not elsewhere classified: Secondary | ICD-10-CM | POA: Diagnosis not present

## 2024-04-12 DIAGNOSIS — E440 Moderate protein-calorie malnutrition: Secondary | ICD-10-CM | POA: Diagnosis not present

## 2024-04-12 DIAGNOSIS — N2589 Other disorders resulting from impaired renal tubular function: Secondary | ICD-10-CM | POA: Diagnosis not present

## 2024-04-12 DIAGNOSIS — Z992 Dependence on renal dialysis: Secondary | ICD-10-CM | POA: Diagnosis not present

## 2024-04-12 DIAGNOSIS — D631 Anemia in chronic kidney disease: Secondary | ICD-10-CM | POA: Diagnosis not present

## 2024-04-12 DIAGNOSIS — E1122 Type 2 diabetes mellitus with diabetic chronic kidney disease: Secondary | ICD-10-CM | POA: Diagnosis not present

## 2024-04-12 DIAGNOSIS — D509 Iron deficiency anemia, unspecified: Secondary | ICD-10-CM | POA: Diagnosis not present

## 2024-04-12 DIAGNOSIS — K7689 Other specified diseases of liver: Secondary | ICD-10-CM | POA: Diagnosis not present

## 2024-04-12 DIAGNOSIS — Z79899 Other long term (current) drug therapy: Secondary | ICD-10-CM | POA: Diagnosis not present

## 2024-04-13 DIAGNOSIS — N2589 Other disorders resulting from impaired renal tubular function: Secondary | ICD-10-CM | POA: Diagnosis not present

## 2024-04-13 DIAGNOSIS — E440 Moderate protein-calorie malnutrition: Secondary | ICD-10-CM | POA: Diagnosis not present

## 2024-04-13 DIAGNOSIS — E878 Other disorders of electrolyte and fluid balance, not elsewhere classified: Secondary | ICD-10-CM | POA: Diagnosis not present

## 2024-04-13 DIAGNOSIS — N2581 Secondary hyperparathyroidism of renal origin: Secondary | ICD-10-CM | POA: Diagnosis not present

## 2024-04-13 DIAGNOSIS — Z992 Dependence on renal dialysis: Secondary | ICD-10-CM | POA: Diagnosis not present

## 2024-04-13 DIAGNOSIS — K7689 Other specified diseases of liver: Secondary | ICD-10-CM | POA: Diagnosis not present

## 2024-04-13 DIAGNOSIS — Z4932 Encounter for adequacy testing for peritoneal dialysis: Secondary | ICD-10-CM | POA: Diagnosis not present

## 2024-04-13 DIAGNOSIS — N186 End stage renal disease: Secondary | ICD-10-CM | POA: Diagnosis not present

## 2024-04-13 DIAGNOSIS — D509 Iron deficiency anemia, unspecified: Secondary | ICD-10-CM | POA: Diagnosis not present

## 2024-04-13 DIAGNOSIS — D631 Anemia in chronic kidney disease: Secondary | ICD-10-CM | POA: Diagnosis not present

## 2024-04-13 DIAGNOSIS — Z79899 Other long term (current) drug therapy: Secondary | ICD-10-CM | POA: Diagnosis not present

## 2024-04-13 DIAGNOSIS — E1122 Type 2 diabetes mellitus with diabetic chronic kidney disease: Secondary | ICD-10-CM | POA: Diagnosis not present

## 2024-04-14 DIAGNOSIS — E1122 Type 2 diabetes mellitus with diabetic chronic kidney disease: Secondary | ICD-10-CM | POA: Diagnosis not present

## 2024-04-14 DIAGNOSIS — N2589 Other disorders resulting from impaired renal tubular function: Secondary | ICD-10-CM | POA: Diagnosis not present

## 2024-04-14 DIAGNOSIS — N186 End stage renal disease: Secondary | ICD-10-CM | POA: Diagnosis not present

## 2024-04-14 DIAGNOSIS — E440 Moderate protein-calorie malnutrition: Secondary | ICD-10-CM | POA: Diagnosis not present

## 2024-04-14 DIAGNOSIS — E878 Other disorders of electrolyte and fluid balance, not elsewhere classified: Secondary | ICD-10-CM | POA: Diagnosis not present

## 2024-04-14 DIAGNOSIS — Z992 Dependence on renal dialysis: Secondary | ICD-10-CM | POA: Diagnosis not present

## 2024-04-14 DIAGNOSIS — D509 Iron deficiency anemia, unspecified: Secondary | ICD-10-CM | POA: Diagnosis not present

## 2024-04-14 DIAGNOSIS — Z4932 Encounter for adequacy testing for peritoneal dialysis: Secondary | ICD-10-CM | POA: Diagnosis not present

## 2024-04-14 DIAGNOSIS — K7689 Other specified diseases of liver: Secondary | ICD-10-CM | POA: Diagnosis not present

## 2024-04-14 DIAGNOSIS — N2581 Secondary hyperparathyroidism of renal origin: Secondary | ICD-10-CM | POA: Diagnosis not present

## 2024-04-14 DIAGNOSIS — Z79899 Other long term (current) drug therapy: Secondary | ICD-10-CM | POA: Diagnosis not present

## 2024-04-14 DIAGNOSIS — D631 Anemia in chronic kidney disease: Secondary | ICD-10-CM | POA: Diagnosis not present

## 2024-04-15 DIAGNOSIS — N186 End stage renal disease: Secondary | ICD-10-CM | POA: Diagnosis not present

## 2024-04-15 DIAGNOSIS — Z79899 Other long term (current) drug therapy: Secondary | ICD-10-CM | POA: Diagnosis not present

## 2024-04-15 DIAGNOSIS — D509 Iron deficiency anemia, unspecified: Secondary | ICD-10-CM | POA: Diagnosis not present

## 2024-04-15 DIAGNOSIS — N2581 Secondary hyperparathyroidism of renal origin: Secondary | ICD-10-CM | POA: Diagnosis not present

## 2024-04-15 DIAGNOSIS — N2589 Other disorders resulting from impaired renal tubular function: Secondary | ICD-10-CM | POA: Diagnosis not present

## 2024-04-15 DIAGNOSIS — E1122 Type 2 diabetes mellitus with diabetic chronic kidney disease: Secondary | ICD-10-CM | POA: Diagnosis not present

## 2024-04-15 DIAGNOSIS — E440 Moderate protein-calorie malnutrition: Secondary | ICD-10-CM | POA: Diagnosis not present

## 2024-04-15 DIAGNOSIS — K7689 Other specified diseases of liver: Secondary | ICD-10-CM | POA: Diagnosis not present

## 2024-04-15 DIAGNOSIS — Z4932 Encounter for adequacy testing for peritoneal dialysis: Secondary | ICD-10-CM | POA: Diagnosis not present

## 2024-04-15 DIAGNOSIS — D631 Anemia in chronic kidney disease: Secondary | ICD-10-CM | POA: Diagnosis not present

## 2024-04-15 DIAGNOSIS — E878 Other disorders of electrolyte and fluid balance, not elsewhere classified: Secondary | ICD-10-CM | POA: Diagnosis not present

## 2024-04-15 DIAGNOSIS — Z992 Dependence on renal dialysis: Secondary | ICD-10-CM | POA: Diagnosis not present

## 2024-04-16 DIAGNOSIS — K7689 Other specified diseases of liver: Secondary | ICD-10-CM | POA: Diagnosis not present

## 2024-04-16 DIAGNOSIS — N2581 Secondary hyperparathyroidism of renal origin: Secondary | ICD-10-CM | POA: Diagnosis not present

## 2024-04-16 DIAGNOSIS — Z4932 Encounter for adequacy testing for peritoneal dialysis: Secondary | ICD-10-CM | POA: Diagnosis not present

## 2024-04-16 DIAGNOSIS — Z79899 Other long term (current) drug therapy: Secondary | ICD-10-CM | POA: Diagnosis not present

## 2024-04-16 DIAGNOSIS — D631 Anemia in chronic kidney disease: Secondary | ICD-10-CM | POA: Diagnosis not present

## 2024-04-16 DIAGNOSIS — N186 End stage renal disease: Secondary | ICD-10-CM | POA: Diagnosis not present

## 2024-04-16 DIAGNOSIS — E440 Moderate protein-calorie malnutrition: Secondary | ICD-10-CM | POA: Diagnosis not present

## 2024-04-16 DIAGNOSIS — D509 Iron deficiency anemia, unspecified: Secondary | ICD-10-CM | POA: Diagnosis not present

## 2024-04-16 DIAGNOSIS — E1122 Type 2 diabetes mellitus with diabetic chronic kidney disease: Secondary | ICD-10-CM | POA: Diagnosis not present

## 2024-04-16 DIAGNOSIS — Z992 Dependence on renal dialysis: Secondary | ICD-10-CM | POA: Diagnosis not present

## 2024-04-16 DIAGNOSIS — N2589 Other disorders resulting from impaired renal tubular function: Secondary | ICD-10-CM | POA: Diagnosis not present

## 2024-04-16 DIAGNOSIS — E878 Other disorders of electrolyte and fluid balance, not elsewhere classified: Secondary | ICD-10-CM | POA: Diagnosis not present

## 2024-04-17 DIAGNOSIS — Z4932 Encounter for adequacy testing for peritoneal dialysis: Secondary | ICD-10-CM | POA: Diagnosis not present

## 2024-04-17 DIAGNOSIS — E1122 Type 2 diabetes mellitus with diabetic chronic kidney disease: Secondary | ICD-10-CM | POA: Diagnosis not present

## 2024-04-17 DIAGNOSIS — N186 End stage renal disease: Secondary | ICD-10-CM | POA: Diagnosis not present

## 2024-04-17 DIAGNOSIS — N2589 Other disorders resulting from impaired renal tubular function: Secondary | ICD-10-CM | POA: Diagnosis not present

## 2024-04-17 DIAGNOSIS — Z79899 Other long term (current) drug therapy: Secondary | ICD-10-CM | POA: Diagnosis not present

## 2024-04-17 DIAGNOSIS — D509 Iron deficiency anemia, unspecified: Secondary | ICD-10-CM | POA: Diagnosis not present

## 2024-04-17 DIAGNOSIS — Z992 Dependence on renal dialysis: Secondary | ICD-10-CM | POA: Diagnosis not present

## 2024-04-17 DIAGNOSIS — D631 Anemia in chronic kidney disease: Secondary | ICD-10-CM | POA: Diagnosis not present

## 2024-04-17 DIAGNOSIS — E440 Moderate protein-calorie malnutrition: Secondary | ICD-10-CM | POA: Diagnosis not present

## 2024-04-17 DIAGNOSIS — N2581 Secondary hyperparathyroidism of renal origin: Secondary | ICD-10-CM | POA: Diagnosis not present

## 2024-04-17 DIAGNOSIS — E878 Other disorders of electrolyte and fluid balance, not elsewhere classified: Secondary | ICD-10-CM | POA: Diagnosis not present

## 2024-04-17 DIAGNOSIS — K7689 Other specified diseases of liver: Secondary | ICD-10-CM | POA: Diagnosis not present

## 2024-04-18 DIAGNOSIS — E1122 Type 2 diabetes mellitus with diabetic chronic kidney disease: Secondary | ICD-10-CM | POA: Diagnosis not present

## 2024-04-18 DIAGNOSIS — N2581 Secondary hyperparathyroidism of renal origin: Secondary | ICD-10-CM | POA: Diagnosis not present

## 2024-04-18 DIAGNOSIS — E440 Moderate protein-calorie malnutrition: Secondary | ICD-10-CM | POA: Diagnosis not present

## 2024-04-18 DIAGNOSIS — Z79899 Other long term (current) drug therapy: Secondary | ICD-10-CM | POA: Diagnosis not present

## 2024-04-18 DIAGNOSIS — N186 End stage renal disease: Secondary | ICD-10-CM | POA: Diagnosis not present

## 2024-04-18 DIAGNOSIS — Z992 Dependence on renal dialysis: Secondary | ICD-10-CM | POA: Diagnosis not present

## 2024-04-18 DIAGNOSIS — N2589 Other disorders resulting from impaired renal tubular function: Secondary | ICD-10-CM | POA: Diagnosis not present

## 2024-04-18 DIAGNOSIS — D631 Anemia in chronic kidney disease: Secondary | ICD-10-CM | POA: Diagnosis not present

## 2024-04-18 DIAGNOSIS — K7689 Other specified diseases of liver: Secondary | ICD-10-CM | POA: Diagnosis not present

## 2024-04-18 DIAGNOSIS — Z4932 Encounter for adequacy testing for peritoneal dialysis: Secondary | ICD-10-CM | POA: Diagnosis not present

## 2024-04-18 DIAGNOSIS — E878 Other disorders of electrolyte and fluid balance, not elsewhere classified: Secondary | ICD-10-CM | POA: Diagnosis not present

## 2024-04-18 DIAGNOSIS — D509 Iron deficiency anemia, unspecified: Secondary | ICD-10-CM | POA: Diagnosis not present

## 2024-04-19 DIAGNOSIS — Z4932 Encounter for adequacy testing for peritoneal dialysis: Secondary | ICD-10-CM | POA: Diagnosis not present

## 2024-04-19 DIAGNOSIS — N2581 Secondary hyperparathyroidism of renal origin: Secondary | ICD-10-CM | POA: Diagnosis not present

## 2024-04-19 DIAGNOSIS — E440 Moderate protein-calorie malnutrition: Secondary | ICD-10-CM | POA: Diagnosis not present

## 2024-04-19 DIAGNOSIS — K7689 Other specified diseases of liver: Secondary | ICD-10-CM | POA: Diagnosis not present

## 2024-04-19 DIAGNOSIS — E878 Other disorders of electrolyte and fluid balance, not elsewhere classified: Secondary | ICD-10-CM | POA: Diagnosis not present

## 2024-04-19 DIAGNOSIS — D631 Anemia in chronic kidney disease: Secondary | ICD-10-CM | POA: Diagnosis not present

## 2024-04-19 DIAGNOSIS — D509 Iron deficiency anemia, unspecified: Secondary | ICD-10-CM | POA: Diagnosis not present

## 2024-04-19 DIAGNOSIS — N2589 Other disorders resulting from impaired renal tubular function: Secondary | ICD-10-CM | POA: Diagnosis not present

## 2024-04-19 DIAGNOSIS — Z79899 Other long term (current) drug therapy: Secondary | ICD-10-CM | POA: Diagnosis not present

## 2024-04-19 DIAGNOSIS — Z992 Dependence on renal dialysis: Secondary | ICD-10-CM | POA: Diagnosis not present

## 2024-04-19 DIAGNOSIS — E1122 Type 2 diabetes mellitus with diabetic chronic kidney disease: Secondary | ICD-10-CM | POA: Diagnosis not present

## 2024-04-19 DIAGNOSIS — N186 End stage renal disease: Secondary | ICD-10-CM | POA: Diagnosis not present

## 2024-04-20 DIAGNOSIS — K7689 Other specified diseases of liver: Secondary | ICD-10-CM | POA: Diagnosis not present

## 2024-04-20 DIAGNOSIS — Z79899 Other long term (current) drug therapy: Secondary | ICD-10-CM | POA: Diagnosis not present

## 2024-04-20 DIAGNOSIS — E878 Other disorders of electrolyte and fluid balance, not elsewhere classified: Secondary | ICD-10-CM | POA: Diagnosis not present

## 2024-04-20 DIAGNOSIS — N186 End stage renal disease: Secondary | ICD-10-CM | POA: Diagnosis not present

## 2024-04-20 DIAGNOSIS — N2589 Other disorders resulting from impaired renal tubular function: Secondary | ICD-10-CM | POA: Diagnosis not present

## 2024-04-20 DIAGNOSIS — Z4932 Encounter for adequacy testing for peritoneal dialysis: Secondary | ICD-10-CM | POA: Diagnosis not present

## 2024-04-20 DIAGNOSIS — D509 Iron deficiency anemia, unspecified: Secondary | ICD-10-CM | POA: Diagnosis not present

## 2024-04-20 DIAGNOSIS — Z992 Dependence on renal dialysis: Secondary | ICD-10-CM | POA: Diagnosis not present

## 2024-04-20 DIAGNOSIS — E1122 Type 2 diabetes mellitus with diabetic chronic kidney disease: Secondary | ICD-10-CM | POA: Diagnosis not present

## 2024-04-20 DIAGNOSIS — E440 Moderate protein-calorie malnutrition: Secondary | ICD-10-CM | POA: Diagnosis not present

## 2024-04-20 DIAGNOSIS — N2581 Secondary hyperparathyroidism of renal origin: Secondary | ICD-10-CM | POA: Diagnosis not present

## 2024-04-20 DIAGNOSIS — D631 Anemia in chronic kidney disease: Secondary | ICD-10-CM | POA: Diagnosis not present

## 2024-04-21 DIAGNOSIS — E1129 Type 2 diabetes mellitus with other diabetic kidney complication: Secondary | ICD-10-CM | POA: Diagnosis not present

## 2024-04-21 DIAGNOSIS — E1122 Type 2 diabetes mellitus with diabetic chronic kidney disease: Secondary | ICD-10-CM | POA: Diagnosis not present

## 2024-04-21 DIAGNOSIS — K7689 Other specified diseases of liver: Secondary | ICD-10-CM | POA: Diagnosis not present

## 2024-04-21 DIAGNOSIS — N2581 Secondary hyperparathyroidism of renal origin: Secondary | ICD-10-CM | POA: Diagnosis not present

## 2024-04-21 DIAGNOSIS — D631 Anemia in chronic kidney disease: Secondary | ICD-10-CM | POA: Diagnosis not present

## 2024-04-21 DIAGNOSIS — N186 End stage renal disease: Secondary | ICD-10-CM | POA: Diagnosis not present

## 2024-04-21 DIAGNOSIS — E440 Moderate protein-calorie malnutrition: Secondary | ICD-10-CM | POA: Diagnosis not present

## 2024-04-21 DIAGNOSIS — E878 Other disorders of electrolyte and fluid balance, not elsewhere classified: Secondary | ICD-10-CM | POA: Diagnosis not present

## 2024-04-21 DIAGNOSIS — Z79899 Other long term (current) drug therapy: Secondary | ICD-10-CM | POA: Diagnosis not present

## 2024-04-21 DIAGNOSIS — N2589 Other disorders resulting from impaired renal tubular function: Secondary | ICD-10-CM | POA: Diagnosis not present

## 2024-04-21 DIAGNOSIS — Z4932 Encounter for adequacy testing for peritoneal dialysis: Secondary | ICD-10-CM | POA: Diagnosis not present

## 2024-04-21 DIAGNOSIS — D509 Iron deficiency anemia, unspecified: Secondary | ICD-10-CM | POA: Diagnosis not present

## 2024-04-21 DIAGNOSIS — Z992 Dependence on renal dialysis: Secondary | ICD-10-CM | POA: Diagnosis not present

## 2024-04-22 DIAGNOSIS — Z992 Dependence on renal dialysis: Secondary | ICD-10-CM | POA: Diagnosis not present

## 2024-04-22 DIAGNOSIS — Z4932 Encounter for adequacy testing for peritoneal dialysis: Secondary | ICD-10-CM | POA: Diagnosis not present

## 2024-04-22 DIAGNOSIS — D509 Iron deficiency anemia, unspecified: Secondary | ICD-10-CM | POA: Diagnosis not present

## 2024-04-22 DIAGNOSIS — N186 End stage renal disease: Secondary | ICD-10-CM | POA: Diagnosis not present

## 2024-04-22 DIAGNOSIS — K7689 Other specified diseases of liver: Secondary | ICD-10-CM | POA: Diagnosis not present

## 2024-04-22 DIAGNOSIS — Z79899 Other long term (current) drug therapy: Secondary | ICD-10-CM | POA: Diagnosis not present

## 2024-04-22 DIAGNOSIS — E440 Moderate protein-calorie malnutrition: Secondary | ICD-10-CM | POA: Diagnosis not present

## 2024-04-22 DIAGNOSIS — D630 Anemia in neoplastic disease: Secondary | ICD-10-CM | POA: Diagnosis not present

## 2024-04-22 DIAGNOSIS — N2581 Secondary hyperparathyroidism of renal origin: Secondary | ICD-10-CM | POA: Diagnosis not present

## 2024-04-22 DIAGNOSIS — E878 Other disorders of electrolyte and fluid balance, not elsewhere classified: Secondary | ICD-10-CM | POA: Diagnosis not present

## 2024-04-23 DIAGNOSIS — E440 Moderate protein-calorie malnutrition: Secondary | ICD-10-CM | POA: Diagnosis not present

## 2024-04-23 DIAGNOSIS — D630 Anemia in neoplastic disease: Secondary | ICD-10-CM | POA: Diagnosis not present

## 2024-04-23 DIAGNOSIS — Z79899 Other long term (current) drug therapy: Secondary | ICD-10-CM | POA: Diagnosis not present

## 2024-04-23 DIAGNOSIS — E878 Other disorders of electrolyte and fluid balance, not elsewhere classified: Secondary | ICD-10-CM | POA: Diagnosis not present

## 2024-04-23 DIAGNOSIS — Z992 Dependence on renal dialysis: Secondary | ICD-10-CM | POA: Diagnosis not present

## 2024-04-23 DIAGNOSIS — Z4932 Encounter for adequacy testing for peritoneal dialysis: Secondary | ICD-10-CM | POA: Diagnosis not present

## 2024-04-23 DIAGNOSIS — N186 End stage renal disease: Secondary | ICD-10-CM | POA: Diagnosis not present

## 2024-04-23 DIAGNOSIS — D509 Iron deficiency anemia, unspecified: Secondary | ICD-10-CM | POA: Diagnosis not present

## 2024-04-23 DIAGNOSIS — N2581 Secondary hyperparathyroidism of renal origin: Secondary | ICD-10-CM | POA: Diagnosis not present

## 2024-04-23 DIAGNOSIS — K7689 Other specified diseases of liver: Secondary | ICD-10-CM | POA: Diagnosis not present

## 2024-04-24 DIAGNOSIS — Z4932 Encounter for adequacy testing for peritoneal dialysis: Secondary | ICD-10-CM | POA: Diagnosis not present

## 2024-04-24 DIAGNOSIS — E440 Moderate protein-calorie malnutrition: Secondary | ICD-10-CM | POA: Diagnosis not present

## 2024-04-24 DIAGNOSIS — E878 Other disorders of electrolyte and fluid balance, not elsewhere classified: Secondary | ICD-10-CM | POA: Diagnosis not present

## 2024-04-24 DIAGNOSIS — D630 Anemia in neoplastic disease: Secondary | ICD-10-CM | POA: Diagnosis not present

## 2024-04-24 DIAGNOSIS — N2581 Secondary hyperparathyroidism of renal origin: Secondary | ICD-10-CM | POA: Diagnosis not present

## 2024-04-24 DIAGNOSIS — N186 End stage renal disease: Secondary | ICD-10-CM | POA: Diagnosis not present

## 2024-04-24 DIAGNOSIS — Z79899 Other long term (current) drug therapy: Secondary | ICD-10-CM | POA: Diagnosis not present

## 2024-04-24 DIAGNOSIS — K7689 Other specified diseases of liver: Secondary | ICD-10-CM | POA: Diagnosis not present

## 2024-04-24 DIAGNOSIS — D509 Iron deficiency anemia, unspecified: Secondary | ICD-10-CM | POA: Diagnosis not present

## 2024-04-24 DIAGNOSIS — Z992 Dependence on renal dialysis: Secondary | ICD-10-CM | POA: Diagnosis not present

## 2024-04-25 DIAGNOSIS — Z4932 Encounter for adequacy testing for peritoneal dialysis: Secondary | ICD-10-CM | POA: Diagnosis not present

## 2024-04-25 DIAGNOSIS — D630 Anemia in neoplastic disease: Secondary | ICD-10-CM | POA: Diagnosis not present

## 2024-04-25 DIAGNOSIS — D509 Iron deficiency anemia, unspecified: Secondary | ICD-10-CM | POA: Diagnosis not present

## 2024-04-25 DIAGNOSIS — E440 Moderate protein-calorie malnutrition: Secondary | ICD-10-CM | POA: Diagnosis not present

## 2024-04-25 DIAGNOSIS — Z992 Dependence on renal dialysis: Secondary | ICD-10-CM | POA: Diagnosis not present

## 2024-04-25 DIAGNOSIS — N186 End stage renal disease: Secondary | ICD-10-CM | POA: Diagnosis not present

## 2024-04-25 DIAGNOSIS — N2581 Secondary hyperparathyroidism of renal origin: Secondary | ICD-10-CM | POA: Diagnosis not present

## 2024-04-25 DIAGNOSIS — K7689 Other specified diseases of liver: Secondary | ICD-10-CM | POA: Diagnosis not present

## 2024-04-25 DIAGNOSIS — Z79899 Other long term (current) drug therapy: Secondary | ICD-10-CM | POA: Diagnosis not present

## 2024-04-25 DIAGNOSIS — E878 Other disorders of electrolyte and fluid balance, not elsewhere classified: Secondary | ICD-10-CM | POA: Diagnosis not present

## 2024-04-26 DIAGNOSIS — Z4932 Encounter for adequacy testing for peritoneal dialysis: Secondary | ICD-10-CM | POA: Diagnosis not present

## 2024-04-26 DIAGNOSIS — D509 Iron deficiency anemia, unspecified: Secondary | ICD-10-CM | POA: Diagnosis not present

## 2024-04-26 DIAGNOSIS — Z79899 Other long term (current) drug therapy: Secondary | ICD-10-CM | POA: Diagnosis not present

## 2024-04-26 DIAGNOSIS — K7689 Other specified diseases of liver: Secondary | ICD-10-CM | POA: Diagnosis not present

## 2024-04-26 DIAGNOSIS — N186 End stage renal disease: Secondary | ICD-10-CM | POA: Diagnosis not present

## 2024-04-26 DIAGNOSIS — N2581 Secondary hyperparathyroidism of renal origin: Secondary | ICD-10-CM | POA: Diagnosis not present

## 2024-04-26 DIAGNOSIS — E440 Moderate protein-calorie malnutrition: Secondary | ICD-10-CM | POA: Diagnosis not present

## 2024-04-26 DIAGNOSIS — D630 Anemia in neoplastic disease: Secondary | ICD-10-CM | POA: Diagnosis not present

## 2024-04-26 DIAGNOSIS — Z992 Dependence on renal dialysis: Secondary | ICD-10-CM | POA: Diagnosis not present

## 2024-04-26 DIAGNOSIS — E878 Other disorders of electrolyte and fluid balance, not elsewhere classified: Secondary | ICD-10-CM | POA: Diagnosis not present

## 2024-04-27 DIAGNOSIS — E878 Other disorders of electrolyte and fluid balance, not elsewhere classified: Secondary | ICD-10-CM | POA: Diagnosis not present

## 2024-04-27 DIAGNOSIS — N2581 Secondary hyperparathyroidism of renal origin: Secondary | ICD-10-CM | POA: Diagnosis not present

## 2024-04-27 DIAGNOSIS — Z4932 Encounter for adequacy testing for peritoneal dialysis: Secondary | ICD-10-CM | POA: Diagnosis not present

## 2024-04-27 DIAGNOSIS — D630 Anemia in neoplastic disease: Secondary | ICD-10-CM | POA: Diagnosis not present

## 2024-04-27 DIAGNOSIS — D509 Iron deficiency anemia, unspecified: Secondary | ICD-10-CM | POA: Diagnosis not present

## 2024-04-27 DIAGNOSIS — Z79899 Other long term (current) drug therapy: Secondary | ICD-10-CM | POA: Diagnosis not present

## 2024-04-27 DIAGNOSIS — E440 Moderate protein-calorie malnutrition: Secondary | ICD-10-CM | POA: Diagnosis not present

## 2024-04-27 DIAGNOSIS — Z992 Dependence on renal dialysis: Secondary | ICD-10-CM | POA: Diagnosis not present

## 2024-04-27 DIAGNOSIS — K7689 Other specified diseases of liver: Secondary | ICD-10-CM | POA: Diagnosis not present

## 2024-04-27 DIAGNOSIS — N186 End stage renal disease: Secondary | ICD-10-CM | POA: Diagnosis not present

## 2024-04-28 ENCOUNTER — Ambulatory Visit (INDEPENDENT_AMBULATORY_CARE_PROVIDER_SITE_OTHER)

## 2024-04-28 ENCOUNTER — Encounter: Payer: Self-pay | Admitting: Pulmonary Disease

## 2024-04-28 ENCOUNTER — Ambulatory Visit: Admitting: Pulmonary Disease

## 2024-04-28 VITALS — BP 116/67 | HR 72 | Ht 69.0 in | Wt 177.4 lb

## 2024-04-28 DIAGNOSIS — N186 End stage renal disease: Secondary | ICD-10-CM | POA: Diagnosis not present

## 2024-04-28 DIAGNOSIS — E878 Other disorders of electrolyte and fluid balance, not elsewhere classified: Secondary | ICD-10-CM | POA: Diagnosis not present

## 2024-04-28 DIAGNOSIS — G4733 Obstructive sleep apnea (adult) (pediatric): Secondary | ICD-10-CM | POA: Diagnosis not present

## 2024-04-28 DIAGNOSIS — Z4932 Encounter for adequacy testing for peritoneal dialysis: Secondary | ICD-10-CM | POA: Diagnosis not present

## 2024-04-28 DIAGNOSIS — R0602 Shortness of breath: Secondary | ICD-10-CM

## 2024-04-28 DIAGNOSIS — Z87891 Personal history of nicotine dependence: Secondary | ICD-10-CM

## 2024-04-28 DIAGNOSIS — D509 Iron deficiency anemia, unspecified: Secondary | ICD-10-CM | POA: Diagnosis not present

## 2024-04-28 DIAGNOSIS — N2581 Secondary hyperparathyroidism of renal origin: Secondary | ICD-10-CM | POA: Diagnosis not present

## 2024-04-28 DIAGNOSIS — R918 Other nonspecific abnormal finding of lung field: Secondary | ICD-10-CM | POA: Diagnosis not present

## 2024-04-28 DIAGNOSIS — Z992 Dependence on renal dialysis: Secondary | ICD-10-CM | POA: Diagnosis not present

## 2024-04-28 DIAGNOSIS — Z79899 Other long term (current) drug therapy: Secondary | ICD-10-CM | POA: Diagnosis not present

## 2024-04-28 DIAGNOSIS — K7689 Other specified diseases of liver: Secondary | ICD-10-CM | POA: Diagnosis not present

## 2024-04-28 DIAGNOSIS — E440 Moderate protein-calorie malnutrition: Secondary | ICD-10-CM | POA: Diagnosis not present

## 2024-04-28 DIAGNOSIS — R0989 Other specified symptoms and signs involving the circulatory and respiratory systems: Secondary | ICD-10-CM | POA: Diagnosis not present

## 2024-04-28 DIAGNOSIS — D630 Anemia in neoplastic disease: Secondary | ICD-10-CM | POA: Diagnosis not present

## 2024-04-28 MED ORDER — DOXYCYCLINE HYCLATE 100 MG PO TABS: 100.0000 mg | ORAL_TABLET | Freq: Two times a day (BID) | ORAL | 0 refills | Status: DC

## 2024-04-28 MED ORDER — PREDNISONE 20 MG PO TABS: 20.0000 mg | ORAL_TABLET | Freq: Every day | ORAL | 0 refills | Status: DC

## 2024-04-28 NOTE — Progress Notes (Signed)
 Russell Bowers    [id number]    April 07, 1951  Primary Care Physician:Bhandari, Waldemar Guillaume, MD  Referring Physician: Clementine Cutting, MD [address] [place],  [place] [address]  Chief complaint:   Seen for shortness of breath Productive cough  HPI:  Patient has had about 4 to 6 weeks of a productive cough Some wheezing  Last seen by Dr. Villa Greaser in January 2025  Does have a history of bronchomalacia hypercarbic respiratory failure diagnosed with diaphragmatic paralysis and moderate obstructive sleep apnea Has been maintained on BiPAP since  20-pack-year smoking history quit in 1994  History of chronic kidney disease for which he was on dialysis was able to get a transplant however currently on peritoneal dialysis  Did have a significant COVID infection in January 2024 with hypoxia, respiratory failure   Outpatient Encounter Medications as of 04/28/2024  Medication Sig   acetaminophen  (TYLENOL ) 325 MG tablet Take 325-650 mg by mouth every 6 (six) hours as needed for moderate pain.   allopurinol  (ZYLOPRIM ) 100 MG tablet Take 100 mg by mouth daily.   apixaban  (ELIQUIS ) 2.5 MG TABS tablet Take 1 tablet (2.5 mg total) by mouth 2 (two) times daily.   B-D ULTRAFINE III SHORT PEN 31G X 8 MM MISC Inject into the skin daily.   calcitRIOL  (ROCALTROL ) 0.25 MCG capsule Take 0.25 mcg by mouth every Monday, Wednesday, and Friday with hemodialysis.   cinacalcet  (SENSIPAR ) 30 MG tablet Take 30 mg by mouth every other day.   cyanocobalamin  (VITAMIN B12) 1000 MCG tablet Take 1,000 mcg by mouth daily.   famotidine  (PEPCID ) 20 MG tablet Take 20 mg by mouth daily.   fluticasone  (FLONASE) 50 MCG/ACT nasal spray Place 1 spray into both nostrils daily.   folic acid  (FOLVITE ) 1 MG tablet TAKE 2 TABLETS BY MOUTH EVERY DAY   furosemide  (LASIX ) 40 MG tablet Take 40 mg by mouth 4 (four) times a week. Sun, Tues, Thurs, and Sat (non-dialysis days)   HUMALOG KWIKPEN 100 UNIT/ML KwikPen  Inject 10-15 Units into the skin daily.   insulin  glargine (LANTUS ) 100 UNIT/ML injection Inject 0.1 mLs (10 Units total) into the skin at bedtime. (Patient taking differently: Inject 10-20 Units into the skin at bedtime.)   loratadine  (CLARITIN ) 10 MG tablet Take 10 mg by mouth daily.   Melatonin 10 MG CAPS Take 10 mg by mouth at bedtime as needed (sleep).   metoprolol  tartrate (LOPRESSOR ) 25 MG tablet Take 25 mg by mouth 2 (two) times daily.   multivitamin (RENA-VIT) TABS tablet Take 1 tablet by mouth daily.   ONETOUCH ULTRA test strip 3 (three) times daily.   oxyCODONE -acetaminophen  (PERCOCET) 5-325 MG tablet Take 1 tablet by mouth every 6 (six) hours as needed for severe pain.   pravastatin  (PRAVACHOL ) 10 MG tablet Take 10 mg by mouth at bedtime.    senna-docusate (SENOKOT-S) 8.6-50 MG tablet Take 1 tablet by mouth daily.   tacrolimus  (PROGRAF ) 0.5 MG capsule Place 4 capsules (2 mg total) under the tongue 2 (two) times daily. (Patient taking differently: Place 1 mg under the tongue 2 (two) times daily.)   WELLBUTRIN XL 150 MG 24 hr tablet Take 150 mg by mouth daily.   amoxicillin -clavulanate (AUGMENTIN ) 500-125 MG tablet Take 1 tablet by mouth 2 (two) times daily. (Patient not taking: Reported on 04/28/2024)   fexofenadine (ALLEGRA ALLERGY) 180 MG tablet Take 180 mg by mouth daily. (Patient not taking: Reported on 04/28/2024)   No  facility-administered encounter medications on file as of 04/28/2024.    Allergies as of 04/28/2024 - Review Complete 04/28/2024  Allergen Reaction Noted   Azathioprine  Other (See Comments) 03/08/2021   Myfortic  [mycophenolate ]  08/26/2023   Phenergan  [promethazine  hcl] Other (See Comments) 01/26/2023   Pollen extract Other (See Comments) 05/21/2012    Past Medical History:  Diagnosis Date   ADHD (attention deficit hyperactivity disorder)    Allergic rhinitis    Cancer (HCC)    Prostate cancer   Cellulitis and abscess of trunk 2014   history of back abscess    Cellulitis and abscess of unspecified site 2014   lower back   Detached retina 2003   Diabetes mellitus without complication (HCC)    Dysrhythmia    PAF, 2nd degree AV block, type 1   Elevated troponin 03/2013   Fistula    OF LEFT ARM   Gout    H/O kidney transplant    History of congestive heart failure    FOLLOWED BY DR. Renna Cary    History of recurrent pneumonia    Hypercarbia    CHRONIC HYPERCARBIC RESPIRATORY FAILURE/CHRONIC PULMONARY INTERSTITIAL DISEASE OF UNKNOWN ETIOLOGY, PULMONOLOGIST DR. Waymond Hailey   Hypertension    Mild concentric left ventricular hypertrophy (LVH)    AND AORTIC STENOSIS FOLLOWED IN THE PAST BY CARDIOLOGIST DR. Renna Cary.   OSA treated with BiPAP    Personal history of colonic polyps    Prostate cancer (HCC) 11/2010   PROSTATECTOMY WITH UROLOGIST DR. DAVIS   Renal failure    Shortness of breath    Sleep disorder breathing    FOLLOWED BY PULMONOLOGIST DR. Villa Greaser    Past Surgical History:  Procedure Laterality Date   CAPD INSERTION N/A 09/04/2023   Procedure: LAPAROSCOPIC INSERTION CONTINUOUS AMBULATORY PERITONEAL DIALYSIS  (CAPD) CATHETER;  Surgeon: Carlene Che, MD;  Location: [facility] OR;  Service: Vascular;  Laterality: N/A;   CATARACT EXTRACTION, BILATERAL  11/2014   COLONOSCOPY  09/2014   KIDNEY TRANSPLANT  12/01/2011   LAPAROSCOPIC LYSIS OF ADHESIONS N/A 09/04/2023   Procedure: LAPAROSCOPIC LYSIS OF ADHESIONS;  Surgeon: Carlene Che, MD;  Location: [facility] OR;  Service: Vascular;  Laterality: N/A;   PROSTATECTOMY     FOR PROSTATE CANCER   prostectomy  11/2010   RETINAL DETACHMENT SURGERY  06/2002    Family History  Problem Relation Age of Onset   Diabetes Mother    Hypertension Mother    Atrial fibrillation Mother    Alzheimer's disease Father    Heart Problems Sister    Heart disease Maternal Grandfather    Heart disease Maternal Grandmother     Social History   Socioeconomic History   Marital status: Married    Spouse name: Not on file    Number of children: Not on file   Years of education: Not on file   Highest education level: Not on file  Occupational History   Occupation: Retired  Tobacco Use   Smoking status: Former    Current packs/day: 0.00    Average packs/day: 1 pack/day for 21.0 years (21.0 ttl pk-yrs)    Types: Cigarettes    Start date: 03/16/1972    Quit date: 03/16/1993    Years since quitting: 31.1   Smokeless tobacco: Never  Vaping Use   Vaping status: Never Used  Substance and Sexual Activity   Alcohol use: Yes    Alcohol/week: 1.0 - 2.0 standard drink of alcohol    Types: 1 - 2 Glasses of wine per  week   Drug use: No   Sexual activity: Not on file  Other Topics Concern   Not on file  Social History Narrative   Not on file   Social Drivers of Health   Financial Resource Strain: Not on file  Food Insecurity: No Food Insecurity (01/25/2023)   Hunger Vital Sign    Worried About Running Out of Food in the Last Year: Never true    Ran Out of Food in the Last Year: Never true  Transportation Needs: No Transportation Needs (01/25/2023)   PRAPARE - Administrator, Civil Service (Medical): No    Lack of Transportation (Non-Medical): No  Physical Activity: Not on file  Stress: Not on file  Social Connections: Not on file  Intimate Partner Violence: Not At Risk (01/25/2023)   Humiliation, Afraid, Rape, and Kick questionnaire    Fear of Current or Ex-Partner: No    Emotionally Abused: No    Physically Abused: No    Sexually Abused: No    Review of Systems  Vitals:   04/28/24 1016  BP: 116/67  Pulse: 72  SpO2: 91%     Physical Exam Constitutional:      Appearance: Normal appearance.  HENT:     Head: Normocephalic.     Mouth/Throat:     Mouth: Mucous membranes are moist.  Eyes:     General: No scleral icterus. Cardiovascular:     Rate and Rhythm: Normal rate and regular rhythm.     Heart sounds: No murmur heard.    No friction rub.  Pulmonary:     Effort: No respiratory  distress.     Breath sounds: No stridor. No wheezing or rhonchi.  Musculoskeletal:     Cervical back: No rigidity or tenderness.  Neurological:     Mental Status: He is alert.  Psychiatric:        Mood and Affect: Mood normal.        Thought Content: Thought content normal.      Data Reviewed: Echocardiogram January 2024 shows normal ejection fraction of 60-65%, moderately reduced right ventricular systolic function, moderately dilated left atrium  CT scan of the chest from January 2024 reviewed by myself showing bilateral effusions, dilated pulmonary arteries,  Sleep study with moderate obstructive sleep apnea in 2013  Assessment:  Sleep apnea - Compliant with BiPAP use  Shortness of breath, wheezing - Possible underlying chronic bronchitis - Wheezes more at night  Plan/Recommendations: Chest x-ray today  Schedule for pulmonary function test  Schedule for echocardiogram  Prescription for doxycycline for 10 days, prednisone  20 daily for 5 to 7 days  Encouraged to continue using BiPAP nightly  Continue allergy medications  Follow-up in 6 to 8 weeks   Myer Artis MD [facility] 04/28/2024, 10:20 AM  CC: Clementine Cutting, *

## 2024-04-28 NOTE — Patient Instructions (Addendum)
 Chest x-ray today  Schedule for pulmonary function test  Schedule for echocardiogram  Will call you in a prescription for antibiotic and steroids  Exercise oximetry at next visit  Continue using your BiPAP  Continue allergy medications, nasal sprays - Can continue with Claritin , may consider Zyrtec or Xyzal-all over-the-counter  Call us  with significant concerns  Follow-up in 6 to 8 weeks

## 2024-04-29 DIAGNOSIS — E878 Other disorders of electrolyte and fluid balance, not elsewhere classified: Secondary | ICD-10-CM | POA: Diagnosis not present

## 2024-04-29 DIAGNOSIS — E440 Moderate protein-calorie malnutrition: Secondary | ICD-10-CM | POA: Diagnosis not present

## 2024-04-29 DIAGNOSIS — D509 Iron deficiency anemia, unspecified: Secondary | ICD-10-CM | POA: Diagnosis not present

## 2024-04-29 DIAGNOSIS — K7689 Other specified diseases of liver: Secondary | ICD-10-CM | POA: Diagnosis not present

## 2024-04-29 DIAGNOSIS — Z79899 Other long term (current) drug therapy: Secondary | ICD-10-CM | POA: Diagnosis not present

## 2024-04-29 DIAGNOSIS — Z4932 Encounter for adequacy testing for peritoneal dialysis: Secondary | ICD-10-CM | POA: Diagnosis not present

## 2024-04-29 DIAGNOSIS — Z992 Dependence on renal dialysis: Secondary | ICD-10-CM | POA: Diagnosis not present

## 2024-04-29 DIAGNOSIS — N2581 Secondary hyperparathyroidism of renal origin: Secondary | ICD-10-CM | POA: Diagnosis not present

## 2024-04-29 DIAGNOSIS — D630 Anemia in neoplastic disease: Secondary | ICD-10-CM | POA: Diagnosis not present

## 2024-04-29 DIAGNOSIS — N186 End stage renal disease: Secondary | ICD-10-CM | POA: Diagnosis not present

## 2024-04-30 DIAGNOSIS — N186 End stage renal disease: Secondary | ICD-10-CM | POA: Diagnosis not present

## 2024-04-30 DIAGNOSIS — K7689 Other specified diseases of liver: Secondary | ICD-10-CM | POA: Diagnosis not present

## 2024-04-30 DIAGNOSIS — D630 Anemia in neoplastic disease: Secondary | ICD-10-CM | POA: Diagnosis not present

## 2024-04-30 DIAGNOSIS — Z4932 Encounter for adequacy testing for peritoneal dialysis: Secondary | ICD-10-CM | POA: Diagnosis not present

## 2024-04-30 DIAGNOSIS — D509 Iron deficiency anemia, unspecified: Secondary | ICD-10-CM | POA: Diagnosis not present

## 2024-04-30 DIAGNOSIS — N2581 Secondary hyperparathyroidism of renal origin: Secondary | ICD-10-CM | POA: Diagnosis not present

## 2024-04-30 DIAGNOSIS — Z79899 Other long term (current) drug therapy: Secondary | ICD-10-CM | POA: Diagnosis not present

## 2024-04-30 DIAGNOSIS — E878 Other disorders of electrolyte and fluid balance, not elsewhere classified: Secondary | ICD-10-CM | POA: Diagnosis not present

## 2024-04-30 DIAGNOSIS — E440 Moderate protein-calorie malnutrition: Secondary | ICD-10-CM | POA: Diagnosis not present

## 2024-04-30 DIAGNOSIS — Z992 Dependence on renal dialysis: Secondary | ICD-10-CM | POA: Diagnosis not present

## 2024-05-01 DIAGNOSIS — Z992 Dependence on renal dialysis: Secondary | ICD-10-CM | POA: Diagnosis not present

## 2024-05-01 DIAGNOSIS — Z4932 Encounter for adequacy testing for peritoneal dialysis: Secondary | ICD-10-CM | POA: Diagnosis not present

## 2024-05-01 DIAGNOSIS — Z79899 Other long term (current) drug therapy: Secondary | ICD-10-CM | POA: Diagnosis not present

## 2024-05-01 DIAGNOSIS — N186 End stage renal disease: Secondary | ICD-10-CM | POA: Diagnosis not present

## 2024-05-01 DIAGNOSIS — D630 Anemia in neoplastic disease: Secondary | ICD-10-CM | POA: Diagnosis not present

## 2024-05-01 DIAGNOSIS — E440 Moderate protein-calorie malnutrition: Secondary | ICD-10-CM | POA: Diagnosis not present

## 2024-05-01 DIAGNOSIS — D509 Iron deficiency anemia, unspecified: Secondary | ICD-10-CM | POA: Diagnosis not present

## 2024-05-01 DIAGNOSIS — K7689 Other specified diseases of liver: Secondary | ICD-10-CM | POA: Diagnosis not present

## 2024-05-01 DIAGNOSIS — N2581 Secondary hyperparathyroidism of renal origin: Secondary | ICD-10-CM | POA: Diagnosis not present

## 2024-05-01 DIAGNOSIS — E878 Other disorders of electrolyte and fluid balance, not elsewhere classified: Secondary | ICD-10-CM | POA: Diagnosis not present

## 2024-05-02 DIAGNOSIS — N2581 Secondary hyperparathyroidism of renal origin: Secondary | ICD-10-CM | POA: Diagnosis not present

## 2024-05-02 DIAGNOSIS — D630 Anemia in neoplastic disease: Secondary | ICD-10-CM | POA: Diagnosis not present

## 2024-05-02 DIAGNOSIS — N186 End stage renal disease: Secondary | ICD-10-CM | POA: Diagnosis not present

## 2024-05-02 DIAGNOSIS — K7689 Other specified diseases of liver: Secondary | ICD-10-CM | POA: Diagnosis not present

## 2024-05-02 DIAGNOSIS — E440 Moderate protein-calorie malnutrition: Secondary | ICD-10-CM | POA: Diagnosis not present

## 2024-05-02 DIAGNOSIS — E878 Other disorders of electrolyte and fluid balance, not elsewhere classified: Secondary | ICD-10-CM | POA: Diagnosis not present

## 2024-05-02 DIAGNOSIS — Z4932 Encounter for adequacy testing for peritoneal dialysis: Secondary | ICD-10-CM | POA: Diagnosis not present

## 2024-05-02 DIAGNOSIS — Z992 Dependence on renal dialysis: Secondary | ICD-10-CM | POA: Diagnosis not present

## 2024-05-02 DIAGNOSIS — D509 Iron deficiency anemia, unspecified: Secondary | ICD-10-CM | POA: Diagnosis not present

## 2024-05-02 DIAGNOSIS — Z79899 Other long term (current) drug therapy: Secondary | ICD-10-CM | POA: Diagnosis not present

## 2024-05-03 DIAGNOSIS — N2581 Secondary hyperparathyroidism of renal origin: Secondary | ICD-10-CM | POA: Diagnosis not present

## 2024-05-03 DIAGNOSIS — Z992 Dependence on renal dialysis: Secondary | ICD-10-CM | POA: Diagnosis not present

## 2024-05-03 DIAGNOSIS — K7689 Other specified diseases of liver: Secondary | ICD-10-CM | POA: Diagnosis not present

## 2024-05-03 DIAGNOSIS — E878 Other disorders of electrolyte and fluid balance, not elsewhere classified: Secondary | ICD-10-CM | POA: Diagnosis not present

## 2024-05-03 DIAGNOSIS — D630 Anemia in neoplastic disease: Secondary | ICD-10-CM | POA: Diagnosis not present

## 2024-05-03 DIAGNOSIS — Z4932 Encounter for adequacy testing for peritoneal dialysis: Secondary | ICD-10-CM | POA: Diagnosis not present

## 2024-05-03 DIAGNOSIS — D509 Iron deficiency anemia, unspecified: Secondary | ICD-10-CM | POA: Diagnosis not present

## 2024-05-03 DIAGNOSIS — Z79899 Other long term (current) drug therapy: Secondary | ICD-10-CM | POA: Diagnosis not present

## 2024-05-03 DIAGNOSIS — E440 Moderate protein-calorie malnutrition: Secondary | ICD-10-CM | POA: Diagnosis not present

## 2024-05-03 DIAGNOSIS — N186 End stage renal disease: Secondary | ICD-10-CM | POA: Diagnosis not present

## 2024-05-04 DIAGNOSIS — N2581 Secondary hyperparathyroidism of renal origin: Secondary | ICD-10-CM | POA: Diagnosis not present

## 2024-05-04 DIAGNOSIS — D630 Anemia in neoplastic disease: Secondary | ICD-10-CM | POA: Diagnosis not present

## 2024-05-04 DIAGNOSIS — E878 Other disorders of electrolyte and fluid balance, not elsewhere classified: Secondary | ICD-10-CM | POA: Diagnosis not present

## 2024-05-04 DIAGNOSIS — Z992 Dependence on renal dialysis: Secondary | ICD-10-CM | POA: Diagnosis not present

## 2024-05-04 DIAGNOSIS — Z79899 Other long term (current) drug therapy: Secondary | ICD-10-CM | POA: Diagnosis not present

## 2024-05-04 DIAGNOSIS — E440 Moderate protein-calorie malnutrition: Secondary | ICD-10-CM | POA: Diagnosis not present

## 2024-05-04 DIAGNOSIS — K7689 Other specified diseases of liver: Secondary | ICD-10-CM | POA: Diagnosis not present

## 2024-05-04 DIAGNOSIS — Z4932 Encounter for adequacy testing for peritoneal dialysis: Secondary | ICD-10-CM | POA: Diagnosis not present

## 2024-05-04 DIAGNOSIS — D509 Iron deficiency anemia, unspecified: Secondary | ICD-10-CM | POA: Diagnosis not present

## 2024-05-04 DIAGNOSIS — N186 End stage renal disease: Secondary | ICD-10-CM | POA: Diagnosis not present

## 2024-05-05 DIAGNOSIS — K7689 Other specified diseases of liver: Secondary | ICD-10-CM | POA: Diagnosis not present

## 2024-05-05 DIAGNOSIS — N2581 Secondary hyperparathyroidism of renal origin: Secondary | ICD-10-CM | POA: Diagnosis not present

## 2024-05-05 DIAGNOSIS — N186 End stage renal disease: Secondary | ICD-10-CM | POA: Diagnosis not present

## 2024-05-05 DIAGNOSIS — D630 Anemia in neoplastic disease: Secondary | ICD-10-CM | POA: Diagnosis not present

## 2024-05-05 DIAGNOSIS — Z4932 Encounter for adequacy testing for peritoneal dialysis: Secondary | ICD-10-CM | POA: Diagnosis not present

## 2024-05-05 DIAGNOSIS — Z79899 Other long term (current) drug therapy: Secondary | ICD-10-CM | POA: Diagnosis not present

## 2024-05-05 DIAGNOSIS — E878 Other disorders of electrolyte and fluid balance, not elsewhere classified: Secondary | ICD-10-CM | POA: Diagnosis not present

## 2024-05-05 DIAGNOSIS — Z992 Dependence on renal dialysis: Secondary | ICD-10-CM | POA: Diagnosis not present

## 2024-05-05 DIAGNOSIS — E440 Moderate protein-calorie malnutrition: Secondary | ICD-10-CM | POA: Diagnosis not present

## 2024-05-05 DIAGNOSIS — D509 Iron deficiency anemia, unspecified: Secondary | ICD-10-CM | POA: Diagnosis not present

## 2024-05-06 DIAGNOSIS — Z4932 Encounter for adequacy testing for peritoneal dialysis: Secondary | ICD-10-CM | POA: Diagnosis not present

## 2024-05-06 DIAGNOSIS — D630 Anemia in neoplastic disease: Secondary | ICD-10-CM | POA: Diagnosis not present

## 2024-05-06 DIAGNOSIS — E878 Other disorders of electrolyte and fluid balance, not elsewhere classified: Secondary | ICD-10-CM | POA: Diagnosis not present

## 2024-05-06 DIAGNOSIS — Z79899 Other long term (current) drug therapy: Secondary | ICD-10-CM | POA: Diagnosis not present

## 2024-05-06 DIAGNOSIS — Z992 Dependence on renal dialysis: Secondary | ICD-10-CM | POA: Diagnosis not present

## 2024-05-06 DIAGNOSIS — D509 Iron deficiency anemia, unspecified: Secondary | ICD-10-CM | POA: Diagnosis not present

## 2024-05-06 DIAGNOSIS — N186 End stage renal disease: Secondary | ICD-10-CM | POA: Diagnosis not present

## 2024-05-06 DIAGNOSIS — N2581 Secondary hyperparathyroidism of renal origin: Secondary | ICD-10-CM | POA: Diagnosis not present

## 2024-05-06 DIAGNOSIS — E440 Moderate protein-calorie malnutrition: Secondary | ICD-10-CM | POA: Diagnosis not present

## 2024-05-06 DIAGNOSIS — K7689 Other specified diseases of liver: Secondary | ICD-10-CM | POA: Diagnosis not present

## 2024-05-07 DIAGNOSIS — D630 Anemia in neoplastic disease: Secondary | ICD-10-CM | POA: Diagnosis not present

## 2024-05-07 DIAGNOSIS — E878 Other disorders of electrolyte and fluid balance, not elsewhere classified: Secondary | ICD-10-CM | POA: Diagnosis not present

## 2024-05-07 DIAGNOSIS — E440 Moderate protein-calorie malnutrition: Secondary | ICD-10-CM | POA: Diagnosis not present

## 2024-05-07 DIAGNOSIS — Z992 Dependence on renal dialysis: Secondary | ICD-10-CM | POA: Diagnosis not present

## 2024-05-07 DIAGNOSIS — Z4932 Encounter for adequacy testing for peritoneal dialysis: Secondary | ICD-10-CM | POA: Diagnosis not present

## 2024-05-07 DIAGNOSIS — N186 End stage renal disease: Secondary | ICD-10-CM | POA: Diagnosis not present

## 2024-05-07 DIAGNOSIS — D509 Iron deficiency anemia, unspecified: Secondary | ICD-10-CM | POA: Diagnosis not present

## 2024-05-07 DIAGNOSIS — K7689 Other specified diseases of liver: Secondary | ICD-10-CM | POA: Diagnosis not present

## 2024-05-07 DIAGNOSIS — Z79899 Other long term (current) drug therapy: Secondary | ICD-10-CM | POA: Diagnosis not present

## 2024-05-07 DIAGNOSIS — N2581 Secondary hyperparathyroidism of renal origin: Secondary | ICD-10-CM | POA: Diagnosis not present

## 2024-05-08 DIAGNOSIS — Z4932 Encounter for adequacy testing for peritoneal dialysis: Secondary | ICD-10-CM | POA: Diagnosis not present

## 2024-05-08 DIAGNOSIS — Z992 Dependence on renal dialysis: Secondary | ICD-10-CM | POA: Diagnosis not present

## 2024-05-08 DIAGNOSIS — D509 Iron deficiency anemia, unspecified: Secondary | ICD-10-CM | POA: Diagnosis not present

## 2024-05-08 DIAGNOSIS — D630 Anemia in neoplastic disease: Secondary | ICD-10-CM | POA: Diagnosis not present

## 2024-05-08 DIAGNOSIS — N186 End stage renal disease: Secondary | ICD-10-CM | POA: Diagnosis not present

## 2024-05-08 DIAGNOSIS — Z79899 Other long term (current) drug therapy: Secondary | ICD-10-CM | POA: Diagnosis not present

## 2024-05-08 DIAGNOSIS — E440 Moderate protein-calorie malnutrition: Secondary | ICD-10-CM | POA: Diagnosis not present

## 2024-05-08 DIAGNOSIS — N2581 Secondary hyperparathyroidism of renal origin: Secondary | ICD-10-CM | POA: Diagnosis not present

## 2024-05-08 DIAGNOSIS — E878 Other disorders of electrolyte and fluid balance, not elsewhere classified: Secondary | ICD-10-CM | POA: Diagnosis not present

## 2024-05-08 DIAGNOSIS — K7689 Other specified diseases of liver: Secondary | ICD-10-CM | POA: Diagnosis not present

## 2024-05-09 DIAGNOSIS — E440 Moderate protein-calorie malnutrition: Secondary | ICD-10-CM | POA: Diagnosis not present

## 2024-05-09 DIAGNOSIS — K7689 Other specified diseases of liver: Secondary | ICD-10-CM | POA: Diagnosis not present

## 2024-05-09 DIAGNOSIS — N186 End stage renal disease: Secondary | ICD-10-CM | POA: Diagnosis not present

## 2024-05-09 DIAGNOSIS — D630 Anemia in neoplastic disease: Secondary | ICD-10-CM | POA: Diagnosis not present

## 2024-05-09 DIAGNOSIS — Z4932 Encounter for adequacy testing for peritoneal dialysis: Secondary | ICD-10-CM | POA: Diagnosis not present

## 2024-05-09 DIAGNOSIS — Z79899 Other long term (current) drug therapy: Secondary | ICD-10-CM | POA: Diagnosis not present

## 2024-05-09 DIAGNOSIS — Z992 Dependence on renal dialysis: Secondary | ICD-10-CM | POA: Diagnosis not present

## 2024-05-09 DIAGNOSIS — N2581 Secondary hyperparathyroidism of renal origin: Secondary | ICD-10-CM | POA: Diagnosis not present

## 2024-05-09 DIAGNOSIS — D509 Iron deficiency anemia, unspecified: Secondary | ICD-10-CM | POA: Diagnosis not present

## 2024-05-09 DIAGNOSIS — E878 Other disorders of electrolyte and fluid balance, not elsewhere classified: Secondary | ICD-10-CM | POA: Diagnosis not present

## 2024-05-10 DIAGNOSIS — D509 Iron deficiency anemia, unspecified: Secondary | ICD-10-CM | POA: Diagnosis not present

## 2024-05-10 DIAGNOSIS — N186 End stage renal disease: Secondary | ICD-10-CM | POA: Diagnosis not present

## 2024-05-10 DIAGNOSIS — Z4932 Encounter for adequacy testing for peritoneal dialysis: Secondary | ICD-10-CM | POA: Diagnosis not present

## 2024-05-10 DIAGNOSIS — Z992 Dependence on renal dialysis: Secondary | ICD-10-CM | POA: Diagnosis not present

## 2024-05-10 DIAGNOSIS — E440 Moderate protein-calorie malnutrition: Secondary | ICD-10-CM | POA: Diagnosis not present

## 2024-05-10 DIAGNOSIS — D630 Anemia in neoplastic disease: Secondary | ICD-10-CM | POA: Diagnosis not present

## 2024-05-10 DIAGNOSIS — K7689 Other specified diseases of liver: Secondary | ICD-10-CM | POA: Diagnosis not present

## 2024-05-10 DIAGNOSIS — N2581 Secondary hyperparathyroidism of renal origin: Secondary | ICD-10-CM | POA: Diagnosis not present

## 2024-05-10 DIAGNOSIS — Z79899 Other long term (current) drug therapy: Secondary | ICD-10-CM | POA: Diagnosis not present

## 2024-05-10 DIAGNOSIS — E878 Other disorders of electrolyte and fluid balance, not elsewhere classified: Secondary | ICD-10-CM | POA: Diagnosis not present

## 2024-05-11 DIAGNOSIS — D630 Anemia in neoplastic disease: Secondary | ICD-10-CM | POA: Diagnosis not present

## 2024-05-11 DIAGNOSIS — E440 Moderate protein-calorie malnutrition: Secondary | ICD-10-CM | POA: Diagnosis not present

## 2024-05-11 DIAGNOSIS — N2581 Secondary hyperparathyroidism of renal origin: Secondary | ICD-10-CM | POA: Diagnosis not present

## 2024-05-11 DIAGNOSIS — Z4932 Encounter for adequacy testing for peritoneal dialysis: Secondary | ICD-10-CM | POA: Diagnosis not present

## 2024-05-11 DIAGNOSIS — D509 Iron deficiency anemia, unspecified: Secondary | ICD-10-CM | POA: Diagnosis not present

## 2024-05-11 DIAGNOSIS — Z992 Dependence on renal dialysis: Secondary | ICD-10-CM | POA: Diagnosis not present

## 2024-05-11 DIAGNOSIS — Z79899 Other long term (current) drug therapy: Secondary | ICD-10-CM | POA: Diagnosis not present

## 2024-05-11 DIAGNOSIS — N186 End stage renal disease: Secondary | ICD-10-CM | POA: Diagnosis not present

## 2024-05-11 DIAGNOSIS — K7689 Other specified diseases of liver: Secondary | ICD-10-CM | POA: Diagnosis not present

## 2024-05-11 DIAGNOSIS — E878 Other disorders of electrolyte and fluid balance, not elsewhere classified: Secondary | ICD-10-CM | POA: Diagnosis not present

## 2024-05-12 ENCOUNTER — Telehealth (HOSPITAL_COMMUNITY): Payer: Self-pay | Admitting: Pulmonary Disease

## 2024-05-12 DIAGNOSIS — Z79899 Other long term (current) drug therapy: Secondary | ICD-10-CM | POA: Diagnosis not present

## 2024-05-12 DIAGNOSIS — K7689 Other specified diseases of liver: Secondary | ICD-10-CM | POA: Diagnosis not present

## 2024-05-12 DIAGNOSIS — E440 Moderate protein-calorie malnutrition: Secondary | ICD-10-CM | POA: Diagnosis not present

## 2024-05-12 DIAGNOSIS — Z4932 Encounter for adequacy testing for peritoneal dialysis: Secondary | ICD-10-CM | POA: Diagnosis not present

## 2024-05-12 DIAGNOSIS — E878 Other disorders of electrolyte and fluid balance, not elsewhere classified: Secondary | ICD-10-CM | POA: Diagnosis not present

## 2024-05-12 DIAGNOSIS — Z992 Dependence on renal dialysis: Secondary | ICD-10-CM | POA: Diagnosis not present

## 2024-05-12 DIAGNOSIS — N2581 Secondary hyperparathyroidism of renal origin: Secondary | ICD-10-CM | POA: Diagnosis not present

## 2024-05-12 DIAGNOSIS — D630 Anemia in neoplastic disease: Secondary | ICD-10-CM | POA: Diagnosis not present

## 2024-05-12 DIAGNOSIS — N186 End stage renal disease: Secondary | ICD-10-CM | POA: Diagnosis not present

## 2024-05-12 DIAGNOSIS — D509 Iron deficiency anemia, unspecified: Secondary | ICD-10-CM | POA: Diagnosis not present

## 2024-05-12 NOTE — Telephone Encounter (Signed)
 Patient called and cancelled ordered echocardiogram and does not wish to reschedule at this time. Order wil be removed from the echo WQ. Thank you.

## 2024-05-13 DIAGNOSIS — Z79899 Other long term (current) drug therapy: Secondary | ICD-10-CM | POA: Diagnosis not present

## 2024-05-13 DIAGNOSIS — Z992 Dependence on renal dialysis: Secondary | ICD-10-CM | POA: Diagnosis not present

## 2024-05-13 DIAGNOSIS — Z4932 Encounter for adequacy testing for peritoneal dialysis: Secondary | ICD-10-CM | POA: Diagnosis not present

## 2024-05-13 DIAGNOSIS — N2581 Secondary hyperparathyroidism of renal origin: Secondary | ICD-10-CM | POA: Diagnosis not present

## 2024-05-13 DIAGNOSIS — D509 Iron deficiency anemia, unspecified: Secondary | ICD-10-CM | POA: Diagnosis not present

## 2024-05-13 DIAGNOSIS — K7689 Other specified diseases of liver: Secondary | ICD-10-CM | POA: Diagnosis not present

## 2024-05-13 DIAGNOSIS — N186 End stage renal disease: Secondary | ICD-10-CM | POA: Diagnosis not present

## 2024-05-13 DIAGNOSIS — D630 Anemia in neoplastic disease: Secondary | ICD-10-CM | POA: Diagnosis not present

## 2024-05-13 DIAGNOSIS — E440 Moderate protein-calorie malnutrition: Secondary | ICD-10-CM | POA: Diagnosis not present

## 2024-05-13 DIAGNOSIS — E878 Other disorders of electrolyte and fluid balance, not elsewhere classified: Secondary | ICD-10-CM | POA: Diagnosis not present

## 2024-05-14 DIAGNOSIS — K7689 Other specified diseases of liver: Secondary | ICD-10-CM | POA: Diagnosis not present

## 2024-05-14 DIAGNOSIS — E440 Moderate protein-calorie malnutrition: Secondary | ICD-10-CM | POA: Diagnosis not present

## 2024-05-14 DIAGNOSIS — Z992 Dependence on renal dialysis: Secondary | ICD-10-CM | POA: Diagnosis not present

## 2024-05-14 DIAGNOSIS — Z4932 Encounter for adequacy testing for peritoneal dialysis: Secondary | ICD-10-CM | POA: Diagnosis not present

## 2024-05-14 DIAGNOSIS — N186 End stage renal disease: Secondary | ICD-10-CM | POA: Diagnosis not present

## 2024-05-14 DIAGNOSIS — E878 Other disorders of electrolyte and fluid balance, not elsewhere classified: Secondary | ICD-10-CM | POA: Diagnosis not present

## 2024-05-14 DIAGNOSIS — D630 Anemia in neoplastic disease: Secondary | ICD-10-CM | POA: Diagnosis not present

## 2024-05-14 DIAGNOSIS — D509 Iron deficiency anemia, unspecified: Secondary | ICD-10-CM | POA: Diagnosis not present

## 2024-05-14 DIAGNOSIS — Z79899 Other long term (current) drug therapy: Secondary | ICD-10-CM | POA: Diagnosis not present

## 2024-05-14 DIAGNOSIS — N2581 Secondary hyperparathyroidism of renal origin: Secondary | ICD-10-CM | POA: Diagnosis not present

## 2024-05-15 DIAGNOSIS — Z4932 Encounter for adequacy testing for peritoneal dialysis: Secondary | ICD-10-CM | POA: Diagnosis not present

## 2024-05-15 DIAGNOSIS — Z79899 Other long term (current) drug therapy: Secondary | ICD-10-CM | POA: Diagnosis not present

## 2024-05-15 DIAGNOSIS — D630 Anemia in neoplastic disease: Secondary | ICD-10-CM | POA: Diagnosis not present

## 2024-05-15 DIAGNOSIS — N186 End stage renal disease: Secondary | ICD-10-CM | POA: Diagnosis not present

## 2024-05-15 DIAGNOSIS — E878 Other disorders of electrolyte and fluid balance, not elsewhere classified: Secondary | ICD-10-CM | POA: Diagnosis not present

## 2024-05-15 DIAGNOSIS — E440 Moderate protein-calorie malnutrition: Secondary | ICD-10-CM | POA: Diagnosis not present

## 2024-05-15 DIAGNOSIS — D509 Iron deficiency anemia, unspecified: Secondary | ICD-10-CM | POA: Diagnosis not present

## 2024-05-15 DIAGNOSIS — K7689 Other specified diseases of liver: Secondary | ICD-10-CM | POA: Diagnosis not present

## 2024-05-15 DIAGNOSIS — Z992 Dependence on renal dialysis: Secondary | ICD-10-CM | POA: Diagnosis not present

## 2024-05-15 DIAGNOSIS — N2581 Secondary hyperparathyroidism of renal origin: Secondary | ICD-10-CM | POA: Diagnosis not present

## 2024-05-16 DIAGNOSIS — N186 End stage renal disease: Secondary | ICD-10-CM | POA: Diagnosis not present

## 2024-05-16 DIAGNOSIS — N2581 Secondary hyperparathyroidism of renal origin: Secondary | ICD-10-CM | POA: Diagnosis not present

## 2024-05-16 DIAGNOSIS — Z79899 Other long term (current) drug therapy: Secondary | ICD-10-CM | POA: Diagnosis not present

## 2024-05-16 DIAGNOSIS — E878 Other disorders of electrolyte and fluid balance, not elsewhere classified: Secondary | ICD-10-CM | POA: Diagnosis not present

## 2024-05-16 DIAGNOSIS — K7689 Other specified diseases of liver: Secondary | ICD-10-CM | POA: Diagnosis not present

## 2024-05-16 DIAGNOSIS — D509 Iron deficiency anemia, unspecified: Secondary | ICD-10-CM | POA: Diagnosis not present

## 2024-05-16 DIAGNOSIS — D630 Anemia in neoplastic disease: Secondary | ICD-10-CM | POA: Diagnosis not present

## 2024-05-16 DIAGNOSIS — E440 Moderate protein-calorie malnutrition: Secondary | ICD-10-CM | POA: Diagnosis not present

## 2024-05-16 DIAGNOSIS — Z4932 Encounter for adequacy testing for peritoneal dialysis: Secondary | ICD-10-CM | POA: Diagnosis not present

## 2024-05-16 DIAGNOSIS — Z992 Dependence on renal dialysis: Secondary | ICD-10-CM | POA: Diagnosis not present

## 2024-05-17 DIAGNOSIS — E878 Other disorders of electrolyte and fluid balance, not elsewhere classified: Secondary | ICD-10-CM | POA: Diagnosis not present

## 2024-05-17 DIAGNOSIS — N186 End stage renal disease: Secondary | ICD-10-CM | POA: Diagnosis not present

## 2024-05-17 DIAGNOSIS — K7689 Other specified diseases of liver: Secondary | ICD-10-CM | POA: Diagnosis not present

## 2024-05-17 DIAGNOSIS — N2581 Secondary hyperparathyroidism of renal origin: Secondary | ICD-10-CM | POA: Diagnosis not present

## 2024-05-17 DIAGNOSIS — Z79899 Other long term (current) drug therapy: Secondary | ICD-10-CM | POA: Diagnosis not present

## 2024-05-17 DIAGNOSIS — D630 Anemia in neoplastic disease: Secondary | ICD-10-CM | POA: Diagnosis not present

## 2024-05-17 DIAGNOSIS — E440 Moderate protein-calorie malnutrition: Secondary | ICD-10-CM | POA: Diagnosis not present

## 2024-05-17 DIAGNOSIS — Z992 Dependence on renal dialysis: Secondary | ICD-10-CM | POA: Diagnosis not present

## 2024-05-17 DIAGNOSIS — D509 Iron deficiency anemia, unspecified: Secondary | ICD-10-CM | POA: Diagnosis not present

## 2024-05-17 DIAGNOSIS — Z4932 Encounter for adequacy testing for peritoneal dialysis: Secondary | ICD-10-CM | POA: Diagnosis not present

## 2024-05-18 DIAGNOSIS — E878 Other disorders of electrolyte and fluid balance, not elsewhere classified: Secondary | ICD-10-CM | POA: Diagnosis not present

## 2024-05-18 DIAGNOSIS — E440 Moderate protein-calorie malnutrition: Secondary | ICD-10-CM | POA: Diagnosis not present

## 2024-05-18 DIAGNOSIS — N186 End stage renal disease: Secondary | ICD-10-CM | POA: Diagnosis not present

## 2024-05-18 DIAGNOSIS — Z79899 Other long term (current) drug therapy: Secondary | ICD-10-CM | POA: Diagnosis not present

## 2024-05-18 DIAGNOSIS — Z992 Dependence on renal dialysis: Secondary | ICD-10-CM | POA: Diagnosis not present

## 2024-05-18 DIAGNOSIS — D509 Iron deficiency anemia, unspecified: Secondary | ICD-10-CM | POA: Diagnosis not present

## 2024-05-18 DIAGNOSIS — K7689 Other specified diseases of liver: Secondary | ICD-10-CM | POA: Diagnosis not present

## 2024-05-18 DIAGNOSIS — N2581 Secondary hyperparathyroidism of renal origin: Secondary | ICD-10-CM | POA: Diagnosis not present

## 2024-05-18 DIAGNOSIS — D630 Anemia in neoplastic disease: Secondary | ICD-10-CM | POA: Diagnosis not present

## 2024-05-18 DIAGNOSIS — Z4932 Encounter for adequacy testing for peritoneal dialysis: Secondary | ICD-10-CM | POA: Diagnosis not present

## 2024-05-19 DIAGNOSIS — D509 Iron deficiency anemia, unspecified: Secondary | ICD-10-CM | POA: Diagnosis not present

## 2024-05-19 DIAGNOSIS — E878 Other disorders of electrolyte and fluid balance, not elsewhere classified: Secondary | ICD-10-CM | POA: Diagnosis not present

## 2024-05-19 DIAGNOSIS — N2581 Secondary hyperparathyroidism of renal origin: Secondary | ICD-10-CM | POA: Diagnosis not present

## 2024-05-19 DIAGNOSIS — Z79899 Other long term (current) drug therapy: Secondary | ICD-10-CM | POA: Diagnosis not present

## 2024-05-19 DIAGNOSIS — D630 Anemia in neoplastic disease: Secondary | ICD-10-CM | POA: Diagnosis not present

## 2024-05-19 DIAGNOSIS — Z4932 Encounter for adequacy testing for peritoneal dialysis: Secondary | ICD-10-CM | POA: Diagnosis not present

## 2024-05-19 DIAGNOSIS — N186 End stage renal disease: Secondary | ICD-10-CM | POA: Diagnosis not present

## 2024-05-19 DIAGNOSIS — K7689 Other specified diseases of liver: Secondary | ICD-10-CM | POA: Diagnosis not present

## 2024-05-19 DIAGNOSIS — E440 Moderate protein-calorie malnutrition: Secondary | ICD-10-CM | POA: Diagnosis not present

## 2024-05-19 DIAGNOSIS — Z992 Dependence on renal dialysis: Secondary | ICD-10-CM | POA: Diagnosis not present

## 2024-05-20 ENCOUNTER — Other Ambulatory Visit: Payer: Self-pay | Admitting: Hematology & Oncology

## 2024-05-20 DIAGNOSIS — K7689 Other specified diseases of liver: Secondary | ICD-10-CM | POA: Diagnosis not present

## 2024-05-20 DIAGNOSIS — N2581 Secondary hyperparathyroidism of renal origin: Secondary | ICD-10-CM | POA: Diagnosis not present

## 2024-05-20 DIAGNOSIS — N186 End stage renal disease: Secondary | ICD-10-CM | POA: Diagnosis not present

## 2024-05-20 DIAGNOSIS — D630 Anemia in neoplastic disease: Secondary | ICD-10-CM | POA: Diagnosis not present

## 2024-05-20 DIAGNOSIS — E878 Other disorders of electrolyte and fluid balance, not elsewhere classified: Secondary | ICD-10-CM | POA: Diagnosis not present

## 2024-05-20 DIAGNOSIS — D509 Iron deficiency anemia, unspecified: Secondary | ICD-10-CM | POA: Diagnosis not present

## 2024-05-20 DIAGNOSIS — Z79899 Other long term (current) drug therapy: Secondary | ICD-10-CM | POA: Diagnosis not present

## 2024-05-20 DIAGNOSIS — Z992 Dependence on renal dialysis: Secondary | ICD-10-CM | POA: Diagnosis not present

## 2024-05-20 DIAGNOSIS — E440 Moderate protein-calorie malnutrition: Secondary | ICD-10-CM | POA: Diagnosis not present

## 2024-05-20 DIAGNOSIS — Z4932 Encounter for adequacy testing for peritoneal dialysis: Secondary | ICD-10-CM | POA: Diagnosis not present

## 2024-05-21 DIAGNOSIS — Z79899 Other long term (current) drug therapy: Secondary | ICD-10-CM | POA: Diagnosis not present

## 2024-05-21 DIAGNOSIS — Z4932 Encounter for adequacy testing for peritoneal dialysis: Secondary | ICD-10-CM | POA: Diagnosis not present

## 2024-05-21 DIAGNOSIS — N2581 Secondary hyperparathyroidism of renal origin: Secondary | ICD-10-CM | POA: Diagnosis not present

## 2024-05-21 DIAGNOSIS — N186 End stage renal disease: Secondary | ICD-10-CM | POA: Diagnosis not present

## 2024-05-21 DIAGNOSIS — E440 Moderate protein-calorie malnutrition: Secondary | ICD-10-CM | POA: Diagnosis not present

## 2024-05-21 DIAGNOSIS — D630 Anemia in neoplastic disease: Secondary | ICD-10-CM | POA: Diagnosis not present

## 2024-05-21 DIAGNOSIS — K7689 Other specified diseases of liver: Secondary | ICD-10-CM | POA: Diagnosis not present

## 2024-05-21 DIAGNOSIS — D509 Iron deficiency anemia, unspecified: Secondary | ICD-10-CM | POA: Diagnosis not present

## 2024-05-21 DIAGNOSIS — E878 Other disorders of electrolyte and fluid balance, not elsewhere classified: Secondary | ICD-10-CM | POA: Diagnosis not present

## 2024-05-21 DIAGNOSIS — Z992 Dependence on renal dialysis: Secondary | ICD-10-CM | POA: Diagnosis not present

## 2024-05-22 DIAGNOSIS — D509 Iron deficiency anemia, unspecified: Secondary | ICD-10-CM | POA: Diagnosis not present

## 2024-05-22 DIAGNOSIS — K7689 Other specified diseases of liver: Secondary | ICD-10-CM | POA: Diagnosis not present

## 2024-05-22 DIAGNOSIS — E1129 Type 2 diabetes mellitus with other diabetic kidney complication: Secondary | ICD-10-CM | POA: Diagnosis not present

## 2024-05-22 DIAGNOSIS — N2581 Secondary hyperparathyroidism of renal origin: Secondary | ICD-10-CM | POA: Diagnosis not present

## 2024-05-22 DIAGNOSIS — N186 End stage renal disease: Secondary | ICD-10-CM | POA: Diagnosis not present

## 2024-05-22 DIAGNOSIS — D630 Anemia in neoplastic disease: Secondary | ICD-10-CM | POA: Diagnosis not present

## 2024-05-22 DIAGNOSIS — Z4932 Encounter for adequacy testing for peritoneal dialysis: Secondary | ICD-10-CM | POA: Diagnosis not present

## 2024-05-22 DIAGNOSIS — E440 Moderate protein-calorie malnutrition: Secondary | ICD-10-CM | POA: Diagnosis not present

## 2024-05-22 DIAGNOSIS — E878 Other disorders of electrolyte and fluid balance, not elsewhere classified: Secondary | ICD-10-CM | POA: Diagnosis not present

## 2024-05-22 DIAGNOSIS — Z992 Dependence on renal dialysis: Secondary | ICD-10-CM | POA: Diagnosis not present

## 2024-05-22 DIAGNOSIS — Z79899 Other long term (current) drug therapy: Secondary | ICD-10-CM | POA: Diagnosis not present

## 2024-05-23 DIAGNOSIS — Z4932 Encounter for adequacy testing for peritoneal dialysis: Secondary | ICD-10-CM | POA: Diagnosis not present

## 2024-05-23 DIAGNOSIS — Z79899 Other long term (current) drug therapy: Secondary | ICD-10-CM | POA: Diagnosis not present

## 2024-05-23 DIAGNOSIS — K7689 Other specified diseases of liver: Secondary | ICD-10-CM | POA: Diagnosis not present

## 2024-05-23 DIAGNOSIS — N186 End stage renal disease: Secondary | ICD-10-CM | POA: Diagnosis not present

## 2024-05-23 DIAGNOSIS — E440 Moderate protein-calorie malnutrition: Secondary | ICD-10-CM | POA: Diagnosis not present

## 2024-05-23 DIAGNOSIS — N2581 Secondary hyperparathyroidism of renal origin: Secondary | ICD-10-CM | POA: Diagnosis not present

## 2024-05-23 DIAGNOSIS — D630 Anemia in neoplastic disease: Secondary | ICD-10-CM | POA: Diagnosis not present

## 2024-05-23 DIAGNOSIS — D509 Iron deficiency anemia, unspecified: Secondary | ICD-10-CM | POA: Diagnosis not present

## 2024-05-23 DIAGNOSIS — Z992 Dependence on renal dialysis: Secondary | ICD-10-CM | POA: Diagnosis not present

## 2024-05-24 DIAGNOSIS — N2581 Secondary hyperparathyroidism of renal origin: Secondary | ICD-10-CM | POA: Diagnosis not present

## 2024-05-24 DIAGNOSIS — N186 End stage renal disease: Secondary | ICD-10-CM | POA: Diagnosis not present

## 2024-05-24 DIAGNOSIS — Z992 Dependence on renal dialysis: Secondary | ICD-10-CM | POA: Diagnosis not present

## 2024-05-24 DIAGNOSIS — D509 Iron deficiency anemia, unspecified: Secondary | ICD-10-CM | POA: Diagnosis not present

## 2024-05-24 DIAGNOSIS — Z4932 Encounter for adequacy testing for peritoneal dialysis: Secondary | ICD-10-CM | POA: Diagnosis not present

## 2024-05-24 DIAGNOSIS — Z79899 Other long term (current) drug therapy: Secondary | ICD-10-CM | POA: Diagnosis not present

## 2024-05-24 DIAGNOSIS — K7689 Other specified diseases of liver: Secondary | ICD-10-CM | POA: Diagnosis not present

## 2024-05-24 DIAGNOSIS — D630 Anemia in neoplastic disease: Secondary | ICD-10-CM | POA: Diagnosis not present

## 2024-05-24 DIAGNOSIS — E440 Moderate protein-calorie malnutrition: Secondary | ICD-10-CM | POA: Diagnosis not present

## 2024-05-25 DIAGNOSIS — D509 Iron deficiency anemia, unspecified: Secondary | ICD-10-CM | POA: Diagnosis not present

## 2024-05-25 DIAGNOSIS — E440 Moderate protein-calorie malnutrition: Secondary | ICD-10-CM | POA: Diagnosis not present

## 2024-05-25 DIAGNOSIS — N186 End stage renal disease: Secondary | ICD-10-CM | POA: Diagnosis not present

## 2024-05-25 DIAGNOSIS — Z4932 Encounter for adequacy testing for peritoneal dialysis: Secondary | ICD-10-CM | POA: Diagnosis not present

## 2024-05-25 DIAGNOSIS — Z992 Dependence on renal dialysis: Secondary | ICD-10-CM | POA: Diagnosis not present

## 2024-05-25 DIAGNOSIS — D630 Anemia in neoplastic disease: Secondary | ICD-10-CM | POA: Diagnosis not present

## 2024-05-25 DIAGNOSIS — Z79899 Other long term (current) drug therapy: Secondary | ICD-10-CM | POA: Diagnosis not present

## 2024-05-25 DIAGNOSIS — N2581 Secondary hyperparathyroidism of renal origin: Secondary | ICD-10-CM | POA: Diagnosis not present

## 2024-05-25 DIAGNOSIS — K7689 Other specified diseases of liver: Secondary | ICD-10-CM | POA: Diagnosis not present

## 2024-05-26 DIAGNOSIS — N186 End stage renal disease: Secondary | ICD-10-CM | POA: Diagnosis not present

## 2024-05-26 DIAGNOSIS — D509 Iron deficiency anemia, unspecified: Secondary | ICD-10-CM | POA: Diagnosis not present

## 2024-05-26 DIAGNOSIS — E440 Moderate protein-calorie malnutrition: Secondary | ICD-10-CM | POA: Diagnosis not present

## 2024-05-26 DIAGNOSIS — Z79899 Other long term (current) drug therapy: Secondary | ICD-10-CM | POA: Diagnosis not present

## 2024-05-26 DIAGNOSIS — Z992 Dependence on renal dialysis: Secondary | ICD-10-CM | POA: Diagnosis not present

## 2024-05-26 DIAGNOSIS — N2581 Secondary hyperparathyroidism of renal origin: Secondary | ICD-10-CM | POA: Diagnosis not present

## 2024-05-26 DIAGNOSIS — K7689 Other specified diseases of liver: Secondary | ICD-10-CM | POA: Diagnosis not present

## 2024-05-26 DIAGNOSIS — D630 Anemia in neoplastic disease: Secondary | ICD-10-CM | POA: Diagnosis not present

## 2024-05-26 DIAGNOSIS — Z4932 Encounter for adequacy testing for peritoneal dialysis: Secondary | ICD-10-CM | POA: Diagnosis not present

## 2024-05-27 DIAGNOSIS — N2581 Secondary hyperparathyroidism of renal origin: Secondary | ICD-10-CM | POA: Diagnosis not present

## 2024-05-27 DIAGNOSIS — D630 Anemia in neoplastic disease: Secondary | ICD-10-CM | POA: Diagnosis not present

## 2024-05-27 DIAGNOSIS — Z992 Dependence on renal dialysis: Secondary | ICD-10-CM | POA: Diagnosis not present

## 2024-05-27 DIAGNOSIS — Z4932 Encounter for adequacy testing for peritoneal dialysis: Secondary | ICD-10-CM | POA: Diagnosis not present

## 2024-05-27 DIAGNOSIS — D509 Iron deficiency anemia, unspecified: Secondary | ICD-10-CM | POA: Diagnosis not present

## 2024-05-27 DIAGNOSIS — Z79899 Other long term (current) drug therapy: Secondary | ICD-10-CM | POA: Diagnosis not present

## 2024-05-27 DIAGNOSIS — K7689 Other specified diseases of liver: Secondary | ICD-10-CM | POA: Diagnosis not present

## 2024-05-27 DIAGNOSIS — N186 End stage renal disease: Secondary | ICD-10-CM | POA: Diagnosis not present

## 2024-05-27 DIAGNOSIS — E440 Moderate protein-calorie malnutrition: Secondary | ICD-10-CM | POA: Diagnosis not present

## 2024-05-28 DIAGNOSIS — E440 Moderate protein-calorie malnutrition: Secondary | ICD-10-CM | POA: Diagnosis not present

## 2024-05-28 DIAGNOSIS — K7689 Other specified diseases of liver: Secondary | ICD-10-CM | POA: Diagnosis not present

## 2024-05-28 DIAGNOSIS — D630 Anemia in neoplastic disease: Secondary | ICD-10-CM | POA: Diagnosis not present

## 2024-05-28 DIAGNOSIS — N2581 Secondary hyperparathyroidism of renal origin: Secondary | ICD-10-CM | POA: Diagnosis not present

## 2024-05-28 DIAGNOSIS — Z992 Dependence on renal dialysis: Secondary | ICD-10-CM | POA: Diagnosis not present

## 2024-05-28 DIAGNOSIS — Z4932 Encounter for adequacy testing for peritoneal dialysis: Secondary | ICD-10-CM | POA: Diagnosis not present

## 2024-05-28 DIAGNOSIS — Z79899 Other long term (current) drug therapy: Secondary | ICD-10-CM | POA: Diagnosis not present

## 2024-05-28 DIAGNOSIS — D509 Iron deficiency anemia, unspecified: Secondary | ICD-10-CM | POA: Diagnosis not present

## 2024-05-28 DIAGNOSIS — N186 End stage renal disease: Secondary | ICD-10-CM | POA: Diagnosis not present

## 2024-05-29 DIAGNOSIS — Z79899 Other long term (current) drug therapy: Secondary | ICD-10-CM | POA: Diagnosis not present

## 2024-05-29 DIAGNOSIS — Z992 Dependence on renal dialysis: Secondary | ICD-10-CM | POA: Diagnosis not present

## 2024-05-29 DIAGNOSIS — D630 Anemia in neoplastic disease: Secondary | ICD-10-CM | POA: Diagnosis not present

## 2024-05-29 DIAGNOSIS — E440 Moderate protein-calorie malnutrition: Secondary | ICD-10-CM | POA: Diagnosis not present

## 2024-05-29 DIAGNOSIS — D509 Iron deficiency anemia, unspecified: Secondary | ICD-10-CM | POA: Diagnosis not present

## 2024-05-29 DIAGNOSIS — K7689 Other specified diseases of liver: Secondary | ICD-10-CM | POA: Diagnosis not present

## 2024-05-29 DIAGNOSIS — N2581 Secondary hyperparathyroidism of renal origin: Secondary | ICD-10-CM | POA: Diagnosis not present

## 2024-05-29 DIAGNOSIS — N186 End stage renal disease: Secondary | ICD-10-CM | POA: Diagnosis not present

## 2024-05-29 DIAGNOSIS — Z4932 Encounter for adequacy testing for peritoneal dialysis: Secondary | ICD-10-CM | POA: Diagnosis not present

## 2024-05-30 DIAGNOSIS — N2581 Secondary hyperparathyroidism of renal origin: Secondary | ICD-10-CM | POA: Diagnosis not present

## 2024-05-30 DIAGNOSIS — Z79899 Other long term (current) drug therapy: Secondary | ICD-10-CM | POA: Diagnosis not present

## 2024-05-30 DIAGNOSIS — Z4932 Encounter for adequacy testing for peritoneal dialysis: Secondary | ICD-10-CM | POA: Diagnosis not present

## 2024-05-30 DIAGNOSIS — E440 Moderate protein-calorie malnutrition: Secondary | ICD-10-CM | POA: Diagnosis not present

## 2024-05-30 DIAGNOSIS — Z992 Dependence on renal dialysis: Secondary | ICD-10-CM | POA: Diagnosis not present

## 2024-05-30 DIAGNOSIS — D630 Anemia in neoplastic disease: Secondary | ICD-10-CM | POA: Diagnosis not present

## 2024-05-30 DIAGNOSIS — D509 Iron deficiency anemia, unspecified: Secondary | ICD-10-CM | POA: Diagnosis not present

## 2024-05-30 DIAGNOSIS — N186 End stage renal disease: Secondary | ICD-10-CM | POA: Diagnosis not present

## 2024-05-30 DIAGNOSIS — K7689 Other specified diseases of liver: Secondary | ICD-10-CM | POA: Diagnosis not present

## 2024-05-31 DIAGNOSIS — K7689 Other specified diseases of liver: Secondary | ICD-10-CM | POA: Diagnosis not present

## 2024-05-31 DIAGNOSIS — Z4932 Encounter for adequacy testing for peritoneal dialysis: Secondary | ICD-10-CM | POA: Diagnosis not present

## 2024-05-31 DIAGNOSIS — N2581 Secondary hyperparathyroidism of renal origin: Secondary | ICD-10-CM | POA: Diagnosis not present

## 2024-05-31 DIAGNOSIS — D509 Iron deficiency anemia, unspecified: Secondary | ICD-10-CM | POA: Diagnosis not present

## 2024-05-31 DIAGNOSIS — E440 Moderate protein-calorie malnutrition: Secondary | ICD-10-CM | POA: Diagnosis not present

## 2024-05-31 DIAGNOSIS — Z992 Dependence on renal dialysis: Secondary | ICD-10-CM | POA: Diagnosis not present

## 2024-05-31 DIAGNOSIS — D630 Anemia in neoplastic disease: Secondary | ICD-10-CM | POA: Diagnosis not present

## 2024-05-31 DIAGNOSIS — Z79899 Other long term (current) drug therapy: Secondary | ICD-10-CM | POA: Diagnosis not present

## 2024-05-31 DIAGNOSIS — N186 End stage renal disease: Secondary | ICD-10-CM | POA: Diagnosis not present

## 2024-06-01 DIAGNOSIS — D509 Iron deficiency anemia, unspecified: Secondary | ICD-10-CM | POA: Diagnosis not present

## 2024-06-01 DIAGNOSIS — E440 Moderate protein-calorie malnutrition: Secondary | ICD-10-CM | POA: Diagnosis not present

## 2024-06-01 DIAGNOSIS — Z992 Dependence on renal dialysis: Secondary | ICD-10-CM | POA: Diagnosis not present

## 2024-06-01 DIAGNOSIS — Z79899 Other long term (current) drug therapy: Secondary | ICD-10-CM | POA: Diagnosis not present

## 2024-06-01 DIAGNOSIS — N2581 Secondary hyperparathyroidism of renal origin: Secondary | ICD-10-CM | POA: Diagnosis not present

## 2024-06-01 DIAGNOSIS — K7689 Other specified diseases of liver: Secondary | ICD-10-CM | POA: Diagnosis not present

## 2024-06-01 DIAGNOSIS — N186 End stage renal disease: Secondary | ICD-10-CM | POA: Diagnosis not present

## 2024-06-01 DIAGNOSIS — Z4932 Encounter for adequacy testing for peritoneal dialysis: Secondary | ICD-10-CM | POA: Diagnosis not present

## 2024-06-01 DIAGNOSIS — D630 Anemia in neoplastic disease: Secondary | ICD-10-CM | POA: Diagnosis not present

## 2024-06-02 ENCOUNTER — Other Ambulatory Visit (HOSPITAL_COMMUNITY)

## 2024-06-02 DIAGNOSIS — N2581 Secondary hyperparathyroidism of renal origin: Secondary | ICD-10-CM | POA: Diagnosis not present

## 2024-06-02 DIAGNOSIS — Z992 Dependence on renal dialysis: Secondary | ICD-10-CM | POA: Diagnosis not present

## 2024-06-02 DIAGNOSIS — N186 End stage renal disease: Secondary | ICD-10-CM | POA: Diagnosis not present

## 2024-06-02 DIAGNOSIS — D509 Iron deficiency anemia, unspecified: Secondary | ICD-10-CM | POA: Diagnosis not present

## 2024-06-02 DIAGNOSIS — E440 Moderate protein-calorie malnutrition: Secondary | ICD-10-CM | POA: Diagnosis not present

## 2024-06-02 DIAGNOSIS — K7689 Other specified diseases of liver: Secondary | ICD-10-CM | POA: Diagnosis not present

## 2024-06-02 DIAGNOSIS — Z79899 Other long term (current) drug therapy: Secondary | ICD-10-CM | POA: Diagnosis not present

## 2024-06-02 DIAGNOSIS — Z4932 Encounter for adequacy testing for peritoneal dialysis: Secondary | ICD-10-CM | POA: Diagnosis not present

## 2024-06-02 DIAGNOSIS — D630 Anemia in neoplastic disease: Secondary | ICD-10-CM | POA: Diagnosis not present

## 2024-06-03 DIAGNOSIS — N2581 Secondary hyperparathyroidism of renal origin: Secondary | ICD-10-CM | POA: Diagnosis not present

## 2024-06-03 DIAGNOSIS — N186 End stage renal disease: Secondary | ICD-10-CM | POA: Diagnosis not present

## 2024-06-03 DIAGNOSIS — D630 Anemia in neoplastic disease: Secondary | ICD-10-CM | POA: Diagnosis not present

## 2024-06-03 DIAGNOSIS — D509 Iron deficiency anemia, unspecified: Secondary | ICD-10-CM | POA: Diagnosis not present

## 2024-06-03 DIAGNOSIS — Z4932 Encounter for adequacy testing for peritoneal dialysis: Secondary | ICD-10-CM | POA: Diagnosis not present

## 2024-06-03 DIAGNOSIS — Z992 Dependence on renal dialysis: Secondary | ICD-10-CM | POA: Diagnosis not present

## 2024-06-03 DIAGNOSIS — Z79899 Other long term (current) drug therapy: Secondary | ICD-10-CM | POA: Diagnosis not present

## 2024-06-03 DIAGNOSIS — E440 Moderate protein-calorie malnutrition: Secondary | ICD-10-CM | POA: Diagnosis not present

## 2024-06-03 DIAGNOSIS — K7689 Other specified diseases of liver: Secondary | ICD-10-CM | POA: Diagnosis not present

## 2024-06-04 DIAGNOSIS — K7689 Other specified diseases of liver: Secondary | ICD-10-CM | POA: Diagnosis not present

## 2024-06-04 DIAGNOSIS — Z992 Dependence on renal dialysis: Secondary | ICD-10-CM | POA: Diagnosis not present

## 2024-06-04 DIAGNOSIS — E440 Moderate protein-calorie malnutrition: Secondary | ICD-10-CM | POA: Diagnosis not present

## 2024-06-04 DIAGNOSIS — Z79899 Other long term (current) drug therapy: Secondary | ICD-10-CM | POA: Diagnosis not present

## 2024-06-04 DIAGNOSIS — N186 End stage renal disease: Secondary | ICD-10-CM | POA: Diagnosis not present

## 2024-06-04 DIAGNOSIS — D630 Anemia in neoplastic disease: Secondary | ICD-10-CM | POA: Diagnosis not present

## 2024-06-04 DIAGNOSIS — Z4932 Encounter for adequacy testing for peritoneal dialysis: Secondary | ICD-10-CM | POA: Diagnosis not present

## 2024-06-04 DIAGNOSIS — D509 Iron deficiency anemia, unspecified: Secondary | ICD-10-CM | POA: Diagnosis not present

## 2024-06-04 DIAGNOSIS — N2581 Secondary hyperparathyroidism of renal origin: Secondary | ICD-10-CM | POA: Diagnosis not present

## 2024-06-05 DIAGNOSIS — N186 End stage renal disease: Secondary | ICD-10-CM | POA: Diagnosis not present

## 2024-06-05 DIAGNOSIS — E440 Moderate protein-calorie malnutrition: Secondary | ICD-10-CM | POA: Diagnosis not present

## 2024-06-05 DIAGNOSIS — Z4932 Encounter for adequacy testing for peritoneal dialysis: Secondary | ICD-10-CM | POA: Diagnosis not present

## 2024-06-05 DIAGNOSIS — D630 Anemia in neoplastic disease: Secondary | ICD-10-CM | POA: Diagnosis not present

## 2024-06-05 DIAGNOSIS — Z992 Dependence on renal dialysis: Secondary | ICD-10-CM | POA: Diagnosis not present

## 2024-06-05 DIAGNOSIS — N2581 Secondary hyperparathyroidism of renal origin: Secondary | ICD-10-CM | POA: Diagnosis not present

## 2024-06-05 DIAGNOSIS — K7689 Other specified diseases of liver: Secondary | ICD-10-CM | POA: Diagnosis not present

## 2024-06-05 DIAGNOSIS — Z79899 Other long term (current) drug therapy: Secondary | ICD-10-CM | POA: Diagnosis not present

## 2024-06-05 DIAGNOSIS — D509 Iron deficiency anemia, unspecified: Secondary | ICD-10-CM | POA: Diagnosis not present

## 2024-06-06 DIAGNOSIS — Z79899 Other long term (current) drug therapy: Secondary | ICD-10-CM | POA: Diagnosis not present

## 2024-06-06 DIAGNOSIS — N2581 Secondary hyperparathyroidism of renal origin: Secondary | ICD-10-CM | POA: Diagnosis not present

## 2024-06-06 DIAGNOSIS — K7689 Other specified diseases of liver: Secondary | ICD-10-CM | POA: Diagnosis not present

## 2024-06-06 DIAGNOSIS — D630 Anemia in neoplastic disease: Secondary | ICD-10-CM | POA: Diagnosis not present

## 2024-06-06 DIAGNOSIS — Z992 Dependence on renal dialysis: Secondary | ICD-10-CM | POA: Diagnosis not present

## 2024-06-06 DIAGNOSIS — N186 End stage renal disease: Secondary | ICD-10-CM | POA: Diagnosis not present

## 2024-06-06 DIAGNOSIS — Z4932 Encounter for adequacy testing for peritoneal dialysis: Secondary | ICD-10-CM | POA: Diagnosis not present

## 2024-06-06 DIAGNOSIS — D509 Iron deficiency anemia, unspecified: Secondary | ICD-10-CM | POA: Diagnosis not present

## 2024-06-06 DIAGNOSIS — E440 Moderate protein-calorie malnutrition: Secondary | ICD-10-CM | POA: Diagnosis not present

## 2024-06-07 DIAGNOSIS — K7689 Other specified diseases of liver: Secondary | ICD-10-CM | POA: Diagnosis not present

## 2024-06-07 DIAGNOSIS — N2581 Secondary hyperparathyroidism of renal origin: Secondary | ICD-10-CM | POA: Diagnosis not present

## 2024-06-07 DIAGNOSIS — D509 Iron deficiency anemia, unspecified: Secondary | ICD-10-CM | POA: Diagnosis not present

## 2024-06-07 DIAGNOSIS — D630 Anemia in neoplastic disease: Secondary | ICD-10-CM | POA: Diagnosis not present

## 2024-06-07 DIAGNOSIS — E440 Moderate protein-calorie malnutrition: Secondary | ICD-10-CM | POA: Diagnosis not present

## 2024-06-07 DIAGNOSIS — N186 End stage renal disease: Secondary | ICD-10-CM | POA: Diagnosis not present

## 2024-06-07 DIAGNOSIS — Z992 Dependence on renal dialysis: Secondary | ICD-10-CM | POA: Diagnosis not present

## 2024-06-07 DIAGNOSIS — Z4932 Encounter for adequacy testing for peritoneal dialysis: Secondary | ICD-10-CM | POA: Diagnosis not present

## 2024-06-07 DIAGNOSIS — Z79899 Other long term (current) drug therapy: Secondary | ICD-10-CM | POA: Diagnosis not present

## 2024-06-08 DIAGNOSIS — Z79899 Other long term (current) drug therapy: Secondary | ICD-10-CM | POA: Diagnosis not present

## 2024-06-08 DIAGNOSIS — Z992 Dependence on renal dialysis: Secondary | ICD-10-CM | POA: Diagnosis not present

## 2024-06-08 DIAGNOSIS — Z4932 Encounter for adequacy testing for peritoneal dialysis: Secondary | ICD-10-CM | POA: Diagnosis not present

## 2024-06-08 DIAGNOSIS — L570 Actinic keratosis: Secondary | ICD-10-CM | POA: Diagnosis not present

## 2024-06-08 DIAGNOSIS — N186 End stage renal disease: Secondary | ICD-10-CM | POA: Diagnosis not present

## 2024-06-08 DIAGNOSIS — Z85828 Personal history of other malignant neoplasm of skin: Secondary | ICD-10-CM | POA: Diagnosis not present

## 2024-06-08 DIAGNOSIS — D630 Anemia in neoplastic disease: Secondary | ICD-10-CM | POA: Diagnosis not present

## 2024-06-08 DIAGNOSIS — K7689 Other specified diseases of liver: Secondary | ICD-10-CM | POA: Diagnosis not present

## 2024-06-08 DIAGNOSIS — N2581 Secondary hyperparathyroidism of renal origin: Secondary | ICD-10-CM | POA: Diagnosis not present

## 2024-06-08 DIAGNOSIS — C44329 Squamous cell carcinoma of skin of other parts of face: Secondary | ICD-10-CM | POA: Diagnosis not present

## 2024-06-08 DIAGNOSIS — C44629 Squamous cell carcinoma of skin of left upper limb, including shoulder: Secondary | ICD-10-CM | POA: Diagnosis not present

## 2024-06-08 DIAGNOSIS — E440 Moderate protein-calorie malnutrition: Secondary | ICD-10-CM | POA: Diagnosis not present

## 2024-06-08 DIAGNOSIS — L821 Other seborrheic keratosis: Secondary | ICD-10-CM | POA: Diagnosis not present

## 2024-06-08 DIAGNOSIS — C44622 Squamous cell carcinoma of skin of right upper limb, including shoulder: Secondary | ICD-10-CM | POA: Diagnosis not present

## 2024-06-08 DIAGNOSIS — D509 Iron deficiency anemia, unspecified: Secondary | ICD-10-CM | POA: Diagnosis not present

## 2024-06-08 DIAGNOSIS — D225 Melanocytic nevi of trunk: Secondary | ICD-10-CM | POA: Diagnosis not present

## 2024-06-09 DIAGNOSIS — K7689 Other specified diseases of liver: Secondary | ICD-10-CM | POA: Diagnosis not present

## 2024-06-09 DIAGNOSIS — Z992 Dependence on renal dialysis: Secondary | ICD-10-CM | POA: Diagnosis not present

## 2024-06-09 DIAGNOSIS — N2581 Secondary hyperparathyroidism of renal origin: Secondary | ICD-10-CM | POA: Diagnosis not present

## 2024-06-09 DIAGNOSIS — Z4932 Encounter for adequacy testing for peritoneal dialysis: Secondary | ICD-10-CM | POA: Diagnosis not present

## 2024-06-09 DIAGNOSIS — D630 Anemia in neoplastic disease: Secondary | ICD-10-CM | POA: Diagnosis not present

## 2024-06-09 DIAGNOSIS — Z79899 Other long term (current) drug therapy: Secondary | ICD-10-CM | POA: Diagnosis not present

## 2024-06-09 DIAGNOSIS — D509 Iron deficiency anemia, unspecified: Secondary | ICD-10-CM | POA: Diagnosis not present

## 2024-06-09 DIAGNOSIS — E440 Moderate protein-calorie malnutrition: Secondary | ICD-10-CM | POA: Diagnosis not present

## 2024-06-09 DIAGNOSIS — N186 End stage renal disease: Secondary | ICD-10-CM | POA: Diagnosis not present

## 2024-06-10 DIAGNOSIS — D630 Anemia in neoplastic disease: Secondary | ICD-10-CM | POA: Diagnosis not present

## 2024-06-10 DIAGNOSIS — K7689 Other specified diseases of liver: Secondary | ICD-10-CM | POA: Diagnosis not present

## 2024-06-10 DIAGNOSIS — N186 End stage renal disease: Secondary | ICD-10-CM | POA: Diagnosis not present

## 2024-06-10 DIAGNOSIS — N2581 Secondary hyperparathyroidism of renal origin: Secondary | ICD-10-CM | POA: Diagnosis not present

## 2024-06-10 DIAGNOSIS — E440 Moderate protein-calorie malnutrition: Secondary | ICD-10-CM | POA: Diagnosis not present

## 2024-06-10 DIAGNOSIS — Z992 Dependence on renal dialysis: Secondary | ICD-10-CM | POA: Diagnosis not present

## 2024-06-10 DIAGNOSIS — D509 Iron deficiency anemia, unspecified: Secondary | ICD-10-CM | POA: Diagnosis not present

## 2024-06-10 DIAGNOSIS — Z4932 Encounter for adequacy testing for peritoneal dialysis: Secondary | ICD-10-CM | POA: Diagnosis not present

## 2024-06-10 DIAGNOSIS — Z79899 Other long term (current) drug therapy: Secondary | ICD-10-CM | POA: Diagnosis not present

## 2024-06-11 DIAGNOSIS — D630 Anemia in neoplastic disease: Secondary | ICD-10-CM | POA: Diagnosis not present

## 2024-06-11 DIAGNOSIS — Z992 Dependence on renal dialysis: Secondary | ICD-10-CM | POA: Diagnosis not present

## 2024-06-11 DIAGNOSIS — K7689 Other specified diseases of liver: Secondary | ICD-10-CM | POA: Diagnosis not present

## 2024-06-11 DIAGNOSIS — D509 Iron deficiency anemia, unspecified: Secondary | ICD-10-CM | POA: Diagnosis not present

## 2024-06-11 DIAGNOSIS — N186 End stage renal disease: Secondary | ICD-10-CM | POA: Diagnosis not present

## 2024-06-11 DIAGNOSIS — Z4932 Encounter for adequacy testing for peritoneal dialysis: Secondary | ICD-10-CM | POA: Diagnosis not present

## 2024-06-11 DIAGNOSIS — E440 Moderate protein-calorie malnutrition: Secondary | ICD-10-CM | POA: Diagnosis not present

## 2024-06-11 DIAGNOSIS — Z79899 Other long term (current) drug therapy: Secondary | ICD-10-CM | POA: Diagnosis not present

## 2024-06-11 DIAGNOSIS — N2581 Secondary hyperparathyroidism of renal origin: Secondary | ICD-10-CM | POA: Diagnosis not present

## 2024-06-12 DIAGNOSIS — N2581 Secondary hyperparathyroidism of renal origin: Secondary | ICD-10-CM | POA: Diagnosis not present

## 2024-06-12 DIAGNOSIS — K7689 Other specified diseases of liver: Secondary | ICD-10-CM | POA: Diagnosis not present

## 2024-06-12 DIAGNOSIS — D630 Anemia in neoplastic disease: Secondary | ICD-10-CM | POA: Diagnosis not present

## 2024-06-12 DIAGNOSIS — E440 Moderate protein-calorie malnutrition: Secondary | ICD-10-CM | POA: Diagnosis not present

## 2024-06-12 DIAGNOSIS — Z992 Dependence on renal dialysis: Secondary | ICD-10-CM | POA: Diagnosis not present

## 2024-06-12 DIAGNOSIS — D509 Iron deficiency anemia, unspecified: Secondary | ICD-10-CM | POA: Diagnosis not present

## 2024-06-12 DIAGNOSIS — Z79899 Other long term (current) drug therapy: Secondary | ICD-10-CM | POA: Diagnosis not present

## 2024-06-12 DIAGNOSIS — N186 End stage renal disease: Secondary | ICD-10-CM | POA: Diagnosis not present

## 2024-06-12 DIAGNOSIS — Z4932 Encounter for adequacy testing for peritoneal dialysis: Secondary | ICD-10-CM | POA: Diagnosis not present

## 2024-06-13 DIAGNOSIS — N2581 Secondary hyperparathyroidism of renal origin: Secondary | ICD-10-CM | POA: Diagnosis not present

## 2024-06-13 DIAGNOSIS — Z992 Dependence on renal dialysis: Secondary | ICD-10-CM | POA: Diagnosis not present

## 2024-06-13 DIAGNOSIS — Z79899 Other long term (current) drug therapy: Secondary | ICD-10-CM | POA: Diagnosis not present

## 2024-06-13 DIAGNOSIS — Z4932 Encounter for adequacy testing for peritoneal dialysis: Secondary | ICD-10-CM | POA: Diagnosis not present

## 2024-06-13 DIAGNOSIS — K7689 Other specified diseases of liver: Secondary | ICD-10-CM | POA: Diagnosis not present

## 2024-06-13 DIAGNOSIS — E440 Moderate protein-calorie malnutrition: Secondary | ICD-10-CM | POA: Diagnosis not present

## 2024-06-13 DIAGNOSIS — D509 Iron deficiency anemia, unspecified: Secondary | ICD-10-CM | POA: Diagnosis not present

## 2024-06-13 DIAGNOSIS — N186 End stage renal disease: Secondary | ICD-10-CM | POA: Diagnosis not present

## 2024-06-13 DIAGNOSIS — D630 Anemia in neoplastic disease: Secondary | ICD-10-CM | POA: Diagnosis not present

## 2024-06-14 DIAGNOSIS — N2581 Secondary hyperparathyroidism of renal origin: Secondary | ICD-10-CM | POA: Diagnosis not present

## 2024-06-14 DIAGNOSIS — Z79899 Other long term (current) drug therapy: Secondary | ICD-10-CM | POA: Diagnosis not present

## 2024-06-14 DIAGNOSIS — D630 Anemia in neoplastic disease: Secondary | ICD-10-CM | POA: Diagnosis not present

## 2024-06-14 DIAGNOSIS — D509 Iron deficiency anemia, unspecified: Secondary | ICD-10-CM | POA: Diagnosis not present

## 2024-06-14 DIAGNOSIS — E440 Moderate protein-calorie malnutrition: Secondary | ICD-10-CM | POA: Diagnosis not present

## 2024-06-14 DIAGNOSIS — N186 End stage renal disease: Secondary | ICD-10-CM | POA: Diagnosis not present

## 2024-06-14 DIAGNOSIS — Z4932 Encounter for adequacy testing for peritoneal dialysis: Secondary | ICD-10-CM | POA: Diagnosis not present

## 2024-06-14 DIAGNOSIS — Z992 Dependence on renal dialysis: Secondary | ICD-10-CM | POA: Diagnosis not present

## 2024-06-14 DIAGNOSIS — K7689 Other specified diseases of liver: Secondary | ICD-10-CM | POA: Diagnosis not present

## 2024-06-15 DIAGNOSIS — D509 Iron deficiency anemia, unspecified: Secondary | ICD-10-CM | POA: Diagnosis not present

## 2024-06-15 DIAGNOSIS — Z4932 Encounter for adequacy testing for peritoneal dialysis: Secondary | ICD-10-CM | POA: Diagnosis not present

## 2024-06-15 DIAGNOSIS — Z992 Dependence on renal dialysis: Secondary | ICD-10-CM | POA: Diagnosis not present

## 2024-06-15 DIAGNOSIS — Z79899 Other long term (current) drug therapy: Secondary | ICD-10-CM | POA: Diagnosis not present

## 2024-06-15 DIAGNOSIS — D630 Anemia in neoplastic disease: Secondary | ICD-10-CM | POA: Diagnosis not present

## 2024-06-15 DIAGNOSIS — N186 End stage renal disease: Secondary | ICD-10-CM | POA: Diagnosis not present

## 2024-06-15 DIAGNOSIS — E440 Moderate protein-calorie malnutrition: Secondary | ICD-10-CM | POA: Diagnosis not present

## 2024-06-15 DIAGNOSIS — N2581 Secondary hyperparathyroidism of renal origin: Secondary | ICD-10-CM | POA: Diagnosis not present

## 2024-06-15 DIAGNOSIS — K7689 Other specified diseases of liver: Secondary | ICD-10-CM | POA: Diagnosis not present

## 2024-06-16 DIAGNOSIS — N2581 Secondary hyperparathyroidism of renal origin: Secondary | ICD-10-CM | POA: Diagnosis not present

## 2024-06-16 DIAGNOSIS — N186 End stage renal disease: Secondary | ICD-10-CM | POA: Diagnosis not present

## 2024-06-16 DIAGNOSIS — E440 Moderate protein-calorie malnutrition: Secondary | ICD-10-CM | POA: Diagnosis not present

## 2024-06-16 DIAGNOSIS — D509 Iron deficiency anemia, unspecified: Secondary | ICD-10-CM | POA: Diagnosis not present

## 2024-06-16 DIAGNOSIS — K7689 Other specified diseases of liver: Secondary | ICD-10-CM | POA: Diagnosis not present

## 2024-06-16 DIAGNOSIS — Z992 Dependence on renal dialysis: Secondary | ICD-10-CM | POA: Diagnosis not present

## 2024-06-16 DIAGNOSIS — D630 Anemia in neoplastic disease: Secondary | ICD-10-CM | POA: Diagnosis not present

## 2024-06-16 DIAGNOSIS — Z4932 Encounter for adequacy testing for peritoneal dialysis: Secondary | ICD-10-CM | POA: Diagnosis not present

## 2024-06-16 DIAGNOSIS — Z79899 Other long term (current) drug therapy: Secondary | ICD-10-CM | POA: Diagnosis not present

## 2024-06-17 DIAGNOSIS — E440 Moderate protein-calorie malnutrition: Secondary | ICD-10-CM | POA: Diagnosis not present

## 2024-06-17 DIAGNOSIS — Z79899 Other long term (current) drug therapy: Secondary | ICD-10-CM | POA: Diagnosis not present

## 2024-06-17 DIAGNOSIS — K7689 Other specified diseases of liver: Secondary | ICD-10-CM | POA: Diagnosis not present

## 2024-06-17 DIAGNOSIS — Z992 Dependence on renal dialysis: Secondary | ICD-10-CM | POA: Diagnosis not present

## 2024-06-17 DIAGNOSIS — D509 Iron deficiency anemia, unspecified: Secondary | ICD-10-CM | POA: Diagnosis not present

## 2024-06-17 DIAGNOSIS — N186 End stage renal disease: Secondary | ICD-10-CM | POA: Diagnosis not present

## 2024-06-17 DIAGNOSIS — N2581 Secondary hyperparathyroidism of renal origin: Secondary | ICD-10-CM | POA: Diagnosis not present

## 2024-06-17 DIAGNOSIS — D630 Anemia in neoplastic disease: Secondary | ICD-10-CM | POA: Diagnosis not present

## 2024-06-17 DIAGNOSIS — Z4932 Encounter for adequacy testing for peritoneal dialysis: Secondary | ICD-10-CM | POA: Diagnosis not present

## 2024-06-18 DIAGNOSIS — E440 Moderate protein-calorie malnutrition: Secondary | ICD-10-CM | POA: Diagnosis not present

## 2024-06-18 DIAGNOSIS — D630 Anemia in neoplastic disease: Secondary | ICD-10-CM | POA: Diagnosis not present

## 2024-06-18 DIAGNOSIS — Z79899 Other long term (current) drug therapy: Secondary | ICD-10-CM | POA: Diagnosis not present

## 2024-06-18 DIAGNOSIS — K7689 Other specified diseases of liver: Secondary | ICD-10-CM | POA: Diagnosis not present

## 2024-06-18 DIAGNOSIS — N2581 Secondary hyperparathyroidism of renal origin: Secondary | ICD-10-CM | POA: Diagnosis not present

## 2024-06-18 DIAGNOSIS — Z992 Dependence on renal dialysis: Secondary | ICD-10-CM | POA: Diagnosis not present

## 2024-06-18 DIAGNOSIS — Z4932 Encounter for adequacy testing for peritoneal dialysis: Secondary | ICD-10-CM | POA: Diagnosis not present

## 2024-06-18 DIAGNOSIS — D509 Iron deficiency anemia, unspecified: Secondary | ICD-10-CM | POA: Diagnosis not present

## 2024-06-18 DIAGNOSIS — N186 End stage renal disease: Secondary | ICD-10-CM | POA: Diagnosis not present

## 2024-06-19 DIAGNOSIS — Z79899 Other long term (current) drug therapy: Secondary | ICD-10-CM | POA: Diagnosis not present

## 2024-06-19 DIAGNOSIS — E440 Moderate protein-calorie malnutrition: Secondary | ICD-10-CM | POA: Diagnosis not present

## 2024-06-19 DIAGNOSIS — N2581 Secondary hyperparathyroidism of renal origin: Secondary | ICD-10-CM | POA: Diagnosis not present

## 2024-06-19 DIAGNOSIS — D630 Anemia in neoplastic disease: Secondary | ICD-10-CM | POA: Diagnosis not present

## 2024-06-19 DIAGNOSIS — Z4932 Encounter for adequacy testing for peritoneal dialysis: Secondary | ICD-10-CM | POA: Diagnosis not present

## 2024-06-19 DIAGNOSIS — D509 Iron deficiency anemia, unspecified: Secondary | ICD-10-CM | POA: Diagnosis not present

## 2024-06-19 DIAGNOSIS — K7689 Other specified diseases of liver: Secondary | ICD-10-CM | POA: Diagnosis not present

## 2024-06-19 DIAGNOSIS — Z992 Dependence on renal dialysis: Secondary | ICD-10-CM | POA: Diagnosis not present

## 2024-06-19 DIAGNOSIS — N186 End stage renal disease: Secondary | ICD-10-CM | POA: Diagnosis not present

## 2024-06-20 DIAGNOSIS — K7689 Other specified diseases of liver: Secondary | ICD-10-CM | POA: Diagnosis not present

## 2024-06-20 DIAGNOSIS — N2581 Secondary hyperparathyroidism of renal origin: Secondary | ICD-10-CM | POA: Diagnosis not present

## 2024-06-20 DIAGNOSIS — D509 Iron deficiency anemia, unspecified: Secondary | ICD-10-CM | POA: Diagnosis not present

## 2024-06-20 DIAGNOSIS — D630 Anemia in neoplastic disease: Secondary | ICD-10-CM | POA: Diagnosis not present

## 2024-06-20 DIAGNOSIS — E440 Moderate protein-calorie malnutrition: Secondary | ICD-10-CM | POA: Diagnosis not present

## 2024-06-20 DIAGNOSIS — Z4932 Encounter for adequacy testing for peritoneal dialysis: Secondary | ICD-10-CM | POA: Diagnosis not present

## 2024-06-20 DIAGNOSIS — Z992 Dependence on renal dialysis: Secondary | ICD-10-CM | POA: Diagnosis not present

## 2024-06-20 DIAGNOSIS — N186 End stage renal disease: Secondary | ICD-10-CM | POA: Diagnosis not present

## 2024-06-20 DIAGNOSIS — Z79899 Other long term (current) drug therapy: Secondary | ICD-10-CM | POA: Diagnosis not present

## 2024-06-21 DIAGNOSIS — Z79899 Other long term (current) drug therapy: Secondary | ICD-10-CM | POA: Diagnosis not present

## 2024-06-21 DIAGNOSIS — D509 Iron deficiency anemia, unspecified: Secondary | ICD-10-CM | POA: Diagnosis not present

## 2024-06-21 DIAGNOSIS — N2581 Secondary hyperparathyroidism of renal origin: Secondary | ICD-10-CM | POA: Diagnosis not present

## 2024-06-21 DIAGNOSIS — K7689 Other specified diseases of liver: Secondary | ICD-10-CM | POA: Diagnosis not present

## 2024-06-21 DIAGNOSIS — Z992 Dependence on renal dialysis: Secondary | ICD-10-CM | POA: Diagnosis not present

## 2024-06-21 DIAGNOSIS — D630 Anemia in neoplastic disease: Secondary | ICD-10-CM | POA: Diagnosis not present

## 2024-06-21 DIAGNOSIS — E1129 Type 2 diabetes mellitus with other diabetic kidney complication: Secondary | ICD-10-CM | POA: Diagnosis not present

## 2024-06-21 DIAGNOSIS — Z4932 Encounter for adequacy testing for peritoneal dialysis: Secondary | ICD-10-CM | POA: Diagnosis not present

## 2024-06-21 DIAGNOSIS — N186 End stage renal disease: Secondary | ICD-10-CM | POA: Diagnosis not present

## 2024-06-21 DIAGNOSIS — E440 Moderate protein-calorie malnutrition: Secondary | ICD-10-CM | POA: Diagnosis not present

## 2024-06-22 DIAGNOSIS — K7689 Other specified diseases of liver: Secondary | ICD-10-CM | POA: Diagnosis not present

## 2024-06-22 DIAGNOSIS — N2589 Other disorders resulting from impaired renal tubular function: Secondary | ICD-10-CM | POA: Diagnosis not present

## 2024-06-22 DIAGNOSIS — Z79899 Other long term (current) drug therapy: Secondary | ICD-10-CM | POA: Diagnosis not present

## 2024-06-22 DIAGNOSIS — E878 Other disorders of electrolyte and fluid balance, not elsewhere classified: Secondary | ICD-10-CM | POA: Diagnosis not present

## 2024-06-22 DIAGNOSIS — N2581 Secondary hyperparathyroidism of renal origin: Secondary | ICD-10-CM | POA: Diagnosis not present

## 2024-06-22 DIAGNOSIS — N186 End stage renal disease: Secondary | ICD-10-CM | POA: Diagnosis not present

## 2024-06-22 DIAGNOSIS — D631 Anemia in chronic kidney disease: Secondary | ICD-10-CM | POA: Diagnosis not present

## 2024-06-22 DIAGNOSIS — Z992 Dependence on renal dialysis: Secondary | ICD-10-CM | POA: Diagnosis not present

## 2024-06-22 DIAGNOSIS — Z4932 Encounter for adequacy testing for peritoneal dialysis: Secondary | ICD-10-CM | POA: Diagnosis not present

## 2024-06-22 DIAGNOSIS — E440 Moderate protein-calorie malnutrition: Secondary | ICD-10-CM | POA: Diagnosis not present

## 2024-06-22 DIAGNOSIS — E1122 Type 2 diabetes mellitus with diabetic chronic kidney disease: Secondary | ICD-10-CM | POA: Diagnosis not present

## 2024-06-22 DIAGNOSIS — D509 Iron deficiency anemia, unspecified: Secondary | ICD-10-CM | POA: Diagnosis not present

## 2024-06-23 DIAGNOSIS — E878 Other disorders of electrolyte and fluid balance, not elsewhere classified: Secondary | ICD-10-CM | POA: Diagnosis not present

## 2024-06-23 DIAGNOSIS — E440 Moderate protein-calorie malnutrition: Secondary | ICD-10-CM | POA: Diagnosis not present

## 2024-06-23 DIAGNOSIS — K7689 Other specified diseases of liver: Secondary | ICD-10-CM | POA: Diagnosis not present

## 2024-06-23 DIAGNOSIS — N2589 Other disorders resulting from impaired renal tubular function: Secondary | ICD-10-CM | POA: Diagnosis not present

## 2024-06-23 DIAGNOSIS — Z992 Dependence on renal dialysis: Secondary | ICD-10-CM | POA: Diagnosis not present

## 2024-06-23 DIAGNOSIS — D509 Iron deficiency anemia, unspecified: Secondary | ICD-10-CM | POA: Diagnosis not present

## 2024-06-23 DIAGNOSIS — N2581 Secondary hyperparathyroidism of renal origin: Secondary | ICD-10-CM | POA: Diagnosis not present

## 2024-06-23 DIAGNOSIS — D631 Anemia in chronic kidney disease: Secondary | ICD-10-CM | POA: Diagnosis not present

## 2024-06-23 DIAGNOSIS — E1122 Type 2 diabetes mellitus with diabetic chronic kidney disease: Secondary | ICD-10-CM | POA: Diagnosis not present

## 2024-06-23 DIAGNOSIS — Z79899 Other long term (current) drug therapy: Secondary | ICD-10-CM | POA: Diagnosis not present

## 2024-06-23 DIAGNOSIS — N186 End stage renal disease: Secondary | ICD-10-CM | POA: Diagnosis not present

## 2024-06-23 DIAGNOSIS — Z4932 Encounter for adequacy testing for peritoneal dialysis: Secondary | ICD-10-CM | POA: Diagnosis not present

## 2024-06-24 ENCOUNTER — Ambulatory Visit (HOSPITAL_BASED_OUTPATIENT_CLINIC_OR_DEPARTMENT_OTHER): Admitting: Pulmonary Disease

## 2024-06-24 ENCOUNTER — Encounter (HOSPITAL_BASED_OUTPATIENT_CLINIC_OR_DEPARTMENT_OTHER): Payer: Self-pay | Admitting: Pulmonary Disease

## 2024-06-24 VITALS — BP 124/80 | HR 99 | Ht 67.0 in | Wt 169.0 lb

## 2024-06-24 DIAGNOSIS — K7689 Other specified diseases of liver: Secondary | ICD-10-CM | POA: Diagnosis not present

## 2024-06-24 DIAGNOSIS — E1122 Type 2 diabetes mellitus with diabetic chronic kidney disease: Secondary | ICD-10-CM | POA: Diagnosis not present

## 2024-06-24 DIAGNOSIS — R0609 Other forms of dyspnea: Secondary | ICD-10-CM

## 2024-06-24 DIAGNOSIS — Z4932 Encounter for adequacy testing for peritoneal dialysis: Secondary | ICD-10-CM | POA: Diagnosis not present

## 2024-06-24 DIAGNOSIS — G4733 Obstructive sleep apnea (adult) (pediatric): Secondary | ICD-10-CM | POA: Diagnosis not present

## 2024-06-24 DIAGNOSIS — Z79899 Other long term (current) drug therapy: Secondary | ICD-10-CM | POA: Diagnosis not present

## 2024-06-24 DIAGNOSIS — R0602 Shortness of breath: Secondary | ICD-10-CM

## 2024-06-24 DIAGNOSIS — D509 Iron deficiency anemia, unspecified: Secondary | ICD-10-CM | POA: Diagnosis not present

## 2024-06-24 DIAGNOSIS — N2589 Other disorders resulting from impaired renal tubular function: Secondary | ICD-10-CM | POA: Diagnosis not present

## 2024-06-24 DIAGNOSIS — N186 End stage renal disease: Secondary | ICD-10-CM | POA: Diagnosis not present

## 2024-06-24 DIAGNOSIS — Z87891 Personal history of nicotine dependence: Secondary | ICD-10-CM | POA: Diagnosis not present

## 2024-06-24 DIAGNOSIS — J986 Disorders of diaphragm: Secondary | ICD-10-CM | POA: Diagnosis not present

## 2024-06-24 DIAGNOSIS — Z992 Dependence on renal dialysis: Secondary | ICD-10-CM | POA: Diagnosis not present

## 2024-06-24 DIAGNOSIS — E440 Moderate protein-calorie malnutrition: Secondary | ICD-10-CM | POA: Diagnosis not present

## 2024-06-24 DIAGNOSIS — N2581 Secondary hyperparathyroidism of renal origin: Secondary | ICD-10-CM | POA: Diagnosis not present

## 2024-06-24 DIAGNOSIS — D631 Anemia in chronic kidney disease: Secondary | ICD-10-CM | POA: Diagnosis not present

## 2024-06-24 DIAGNOSIS — E878 Other disorders of electrolyte and fluid balance, not elsewhere classified: Secondary | ICD-10-CM | POA: Diagnosis not present

## 2024-06-24 LAB — PULMONARY FUNCTION TEST
DL/VA % pred: 112 %
DL/VA: 4.57 ml/min/mmHg/L
DLCO cor % pred: 57 %
DLCO cor: 13.42 ml/min/mmHg
DLCO unc % pred: 57 %
DLCO unc: 13.42 ml/min/mmHg
FEF 25-75 Post: 0.75 L/s
FEF 25-75 Pre: 0.92 L/s
FEF2575-%Change-Post: -18 %
FEF2575-%Pred-Post: 35 %
FEF2575-%Pred-Pre: 43 %
FEV1-%Change-Post: 3 %
FEV1-%Pred-Post: 47 %
FEV1-%Pred-Pre: 45 %
FEV1-Post: 1.33 L
FEV1-Pre: 1.29 L
FEV1FVC-%Change-Post: -1 %
FEV1FVC-%Pred-Pre: 102 %
FEV6-%Change-Post: 4 %
FEV6-%Pred-Post: 49 %
FEV6-%Pred-Pre: 47 %
FEV6-Post: 1.8 L
FEV6-Pre: 1.73 L
FEV6FVC-%Pred-Post: 106 %
FEV6FVC-%Pred-Pre: 106 %
FVC-%Change-Post: 4 %
FVC-%Pred-Post: 46 %
FVC-%Pred-Pre: 44 %
FVC-Post: 1.8 L
FVC-Pre: 1.73 L
Post FEV1/FVC ratio: 74 %
Post FEV6/FVC ratio: 100 %
Pre FEV1/FVC ratio: 75 %
Pre FEV6/FVC Ratio: 100 %
RV % pred: 85 %
RV: 2 L
TLC % pred: 59 %
TLC: 3.82 L

## 2024-06-24 NOTE — Patient Instructions (Signed)
 BiPAP is working well on current settings

## 2024-06-24 NOTE — Patient Instructions (Signed)
 Full PFT performed today.

## 2024-06-24 NOTE — Progress Notes (Signed)
 Full PFT performed today.

## 2024-06-24 NOTE — Progress Notes (Signed)
 Subjective:    Patient ID: Russell Bowers, male    DOB: [birth year], 73 y.o.   MRN: [id number]  HPI  73  yo ex-smoker, renal transplant recipient for FU of  mod OSA, diaphragmatic paralysis  -mild bronchomalacia on bronchoscopy 01/2023   He had an episode of hypercarbic respiratory failure in 2014 At that time he was diagnosed with diaphragmatic paralysis and moderate OSA and is maintained on nocturnal BiPAP since then, settings changed to 10/5 He quit smoking 1994 (20-pack-year)   Serial nocturnal oximetry  showed oxygen  desaturations but he  quit using oxygen   in 2017   PMH - renal transplant Prostate CA sp radical prostatectomy in 2011  AS PAF   hospitalized 12/2022 for hypoxia and COVID-pneumonia, on HD since Seen by Dr. MALVA 04/2024 for evaluation of breathing difficulties & cough  He experiences breathing difficulties associated with diaphragm weakness. Nocturnal BiPAP use significantly improves his sleep quality and daytime alertness, and he is compliant with nightly use. Pulmonary function tests indicate moderate to severe restriction with a total lung capacity of 59% and forced vital capacity of 46%. There is no evidence of interstitial lung disease. He experiences no significant shortness of breath when lying down during the day and no current shortness of breath beyond his usual baseline.  He remains on PD, unfortunately he has been turned down for transplant  BiPAP download shows excellent control of events, fantastic compliance more than 8.5 hours per night, average pressure 10/6  Significant tests/ events reviewed     02/2023 ONO on bipap/RA showed desat x 2 h -he was asked to continue oxygen  blended into his CPAP machine.    Polysomnogram at [facility] in 02/2012 showed moderate obstructive sleep apnea with AHI of 25 events per hour, lowest desaturation of 71%. Unfortunately he did not tolerate CPAP on a subsequent titration study.   -admitted 03/2013 with acute  hypercarbic resp failure in setting H Flu HCAP (similar recent admit February at [facility]) and likely decompensated OSA. Required mechanical ventilation Course c/b acute on chronic renal insufficiency, poor glycemic control and back abscess with hx peptostreptococcus.    PFTs 02/2014  showed severe restriction with a ratio 77, FVC 1.35-32%, FEV1 1.04-32% and TLC of 3.0 L-46% with DLCO at 52% that corrects for alveolar volumes suggestive of extra parenchymal restriction. Sniff test showed minimal mobility of diaphragms.   PFTs: 06/2024 Moderate to severe restriction  TLC 59%, FVC 46%, DLCO 57%, KCO normal, lung capacity 60%   Nocturnal oximetry showed severe desaturation on 2 L of oxygen  with a pattern suggestive hypoventilation, he spent 393 minutes with sats less than 88%.    BiPAP titration- 218 pounds- 13/9 cm with a medium fullface mask with 2 liters of oxygen  blended  >>04/2014 could not tolerate high pressure, hence changed to autobipap, finally decreased to 10/5 cm    Review of Systems neg for any significant sore throat, dysphagia, itching, sneezing, nasal congestion or excess/ purulent secretions, fever, chills, sweats, unintended wt loss, pleuritic or exertional cp, hempoptysis, orthopnea pnd or change in chronic leg swelling. Also denies presyncope, palpitations, heartburn, abdominal pain, nausea, vomiting, diarrhea or change in bowel or urinary habits, dysuria,hematuria, rash, arthralgias, visual complaints, headache, numbness weakness or ataxia.     Objective:   Physical Exam  Gen. Pleasant, well-nourished, in no distress ENT - no thrush, no pallor/icterus,no post nasal drip Neck: No JVD, no thyromegaly, no carotid bruits Lungs: no use of accessory muscles, no dullness to percussion, clear  without rales or rhonchi  Cardiovascular: Rhythm regular, heart sounds  normal, no murmurs or gallops, no peripheral edema Musculoskeletal: No deformities, no cyanosis or clubbing         Assessment & Plan:   OSA on bipap Diaphragm weakness Diaphragm weakness persists, contributing to moderate to severe restrictive lung disease as evidenced by PFTs showing lung capacity at 60%, FVC at 46%, and DLCO at 57%. The condition is chronic and likely related to past surgical history, though not directly linked to his kidney transplant. BiPAP therapy has been effective in managing symptoms, particularly during sleep, with excellent compliance. No significant dyspnea reported during the day or when lying down. - Continue nocturnal BiPAP therapy with current settings. - Consider oxygen  therapy at night if breathing issues arise.  End-stage renal disease on peritoneal dialysis End-stage renal disease is managed with nightly peritoneal dialysis. He reports good adaptation to the routine and satisfactory lab results, indicating effective management. The dialysis regimen allows for a good quality of life, providing freedom during the day. - Continue current peritoneal dialysis regimen. - Monitor lab results regularly to ensure continued efficacy of dialysis.  Kidney transplant Current rejection of further transplant options due to high risk associated with age and breathing issues. Calcification of arteries contributes to high-risk status. Management focuses on maintaining quality of life through dialysis and supportive care. - Continue supportive care and dialysis as primary management strategy. - Reassess transplant eligibility if circumstances change.

## 2024-06-25 DIAGNOSIS — D631 Anemia in chronic kidney disease: Secondary | ICD-10-CM | POA: Diagnosis not present

## 2024-06-25 DIAGNOSIS — N2589 Other disorders resulting from impaired renal tubular function: Secondary | ICD-10-CM | POA: Diagnosis not present

## 2024-06-25 DIAGNOSIS — N186 End stage renal disease: Secondary | ICD-10-CM | POA: Diagnosis not present

## 2024-06-25 DIAGNOSIS — D509 Iron deficiency anemia, unspecified: Secondary | ICD-10-CM | POA: Diagnosis not present

## 2024-06-25 DIAGNOSIS — Z4932 Encounter for adequacy testing for peritoneal dialysis: Secondary | ICD-10-CM | POA: Diagnosis not present

## 2024-06-25 DIAGNOSIS — N2581 Secondary hyperparathyroidism of renal origin: Secondary | ICD-10-CM | POA: Diagnosis not present

## 2024-06-25 DIAGNOSIS — K7689 Other specified diseases of liver: Secondary | ICD-10-CM | POA: Diagnosis not present

## 2024-06-25 DIAGNOSIS — E1122 Type 2 diabetes mellitus with diabetic chronic kidney disease: Secondary | ICD-10-CM | POA: Diagnosis not present

## 2024-06-25 DIAGNOSIS — E878 Other disorders of electrolyte and fluid balance, not elsewhere classified: Secondary | ICD-10-CM | POA: Diagnosis not present

## 2024-06-25 DIAGNOSIS — Z992 Dependence on renal dialysis: Secondary | ICD-10-CM | POA: Diagnosis not present

## 2024-06-25 DIAGNOSIS — E440 Moderate protein-calorie malnutrition: Secondary | ICD-10-CM | POA: Diagnosis not present

## 2024-06-25 DIAGNOSIS — Z79899 Other long term (current) drug therapy: Secondary | ICD-10-CM | POA: Diagnosis not present

## 2024-06-26 DIAGNOSIS — Z992 Dependence on renal dialysis: Secondary | ICD-10-CM | POA: Diagnosis not present

## 2024-06-26 DIAGNOSIS — E878 Other disorders of electrolyte and fluid balance, not elsewhere classified: Secondary | ICD-10-CM | POA: Diagnosis not present

## 2024-06-26 DIAGNOSIS — D631 Anemia in chronic kidney disease: Secondary | ICD-10-CM | POA: Diagnosis not present

## 2024-06-26 DIAGNOSIS — E440 Moderate protein-calorie malnutrition: Secondary | ICD-10-CM | POA: Diagnosis not present

## 2024-06-26 DIAGNOSIS — E1122 Type 2 diabetes mellitus with diabetic chronic kidney disease: Secondary | ICD-10-CM | POA: Diagnosis not present

## 2024-06-26 DIAGNOSIS — Z4932 Encounter for adequacy testing for peritoneal dialysis: Secondary | ICD-10-CM | POA: Diagnosis not present

## 2024-06-26 DIAGNOSIS — D509 Iron deficiency anemia, unspecified: Secondary | ICD-10-CM | POA: Diagnosis not present

## 2024-06-26 DIAGNOSIS — N2581 Secondary hyperparathyroidism of renal origin: Secondary | ICD-10-CM | POA: Diagnosis not present

## 2024-06-26 DIAGNOSIS — N186 End stage renal disease: Secondary | ICD-10-CM | POA: Diagnosis not present

## 2024-06-26 DIAGNOSIS — Z79899 Other long term (current) drug therapy: Secondary | ICD-10-CM | POA: Diagnosis not present

## 2024-06-26 DIAGNOSIS — K7689 Other specified diseases of liver: Secondary | ICD-10-CM | POA: Diagnosis not present

## 2024-06-26 DIAGNOSIS — N2589 Other disorders resulting from impaired renal tubular function: Secondary | ICD-10-CM | POA: Diagnosis not present

## 2024-06-27 DIAGNOSIS — D631 Anemia in chronic kidney disease: Secondary | ICD-10-CM | POA: Diagnosis not present

## 2024-06-27 DIAGNOSIS — K7689 Other specified diseases of liver: Secondary | ICD-10-CM | POA: Diagnosis not present

## 2024-06-27 DIAGNOSIS — N186 End stage renal disease: Secondary | ICD-10-CM | POA: Diagnosis not present

## 2024-06-27 DIAGNOSIS — Z4932 Encounter for adequacy testing for peritoneal dialysis: Secondary | ICD-10-CM | POA: Diagnosis not present

## 2024-06-27 DIAGNOSIS — N2589 Other disorders resulting from impaired renal tubular function: Secondary | ICD-10-CM | POA: Diagnosis not present

## 2024-06-27 DIAGNOSIS — E878 Other disorders of electrolyte and fluid balance, not elsewhere classified: Secondary | ICD-10-CM | POA: Diagnosis not present

## 2024-06-27 DIAGNOSIS — E440 Moderate protein-calorie malnutrition: Secondary | ICD-10-CM | POA: Diagnosis not present

## 2024-06-27 DIAGNOSIS — E1122 Type 2 diabetes mellitus with diabetic chronic kidney disease: Secondary | ICD-10-CM | POA: Diagnosis not present

## 2024-06-27 DIAGNOSIS — Z79899 Other long term (current) drug therapy: Secondary | ICD-10-CM | POA: Diagnosis not present

## 2024-06-27 DIAGNOSIS — Z992 Dependence on renal dialysis: Secondary | ICD-10-CM | POA: Diagnosis not present

## 2024-06-27 DIAGNOSIS — D509 Iron deficiency anemia, unspecified: Secondary | ICD-10-CM | POA: Diagnosis not present

## 2024-06-27 DIAGNOSIS — N2581 Secondary hyperparathyroidism of renal origin: Secondary | ICD-10-CM | POA: Diagnosis not present

## 2024-06-28 DIAGNOSIS — E878 Other disorders of electrolyte and fluid balance, not elsewhere classified: Secondary | ICD-10-CM | POA: Diagnosis not present

## 2024-06-28 DIAGNOSIS — N2581 Secondary hyperparathyroidism of renal origin: Secondary | ICD-10-CM | POA: Diagnosis not present

## 2024-06-28 DIAGNOSIS — D631 Anemia in chronic kidney disease: Secondary | ICD-10-CM | POA: Diagnosis not present

## 2024-06-28 DIAGNOSIS — N186 End stage renal disease: Secondary | ICD-10-CM | POA: Diagnosis not present

## 2024-06-28 DIAGNOSIS — Z79899 Other long term (current) drug therapy: Secondary | ICD-10-CM | POA: Diagnosis not present

## 2024-06-28 DIAGNOSIS — Z992 Dependence on renal dialysis: Secondary | ICD-10-CM | POA: Diagnosis not present

## 2024-06-28 DIAGNOSIS — E440 Moderate protein-calorie malnutrition: Secondary | ICD-10-CM | POA: Diagnosis not present

## 2024-06-28 DIAGNOSIS — K7689 Other specified diseases of liver: Secondary | ICD-10-CM | POA: Diagnosis not present

## 2024-06-28 DIAGNOSIS — N2589 Other disorders resulting from impaired renal tubular function: Secondary | ICD-10-CM | POA: Diagnosis not present

## 2024-06-28 DIAGNOSIS — D509 Iron deficiency anemia, unspecified: Secondary | ICD-10-CM | POA: Diagnosis not present

## 2024-06-28 DIAGNOSIS — E1122 Type 2 diabetes mellitus with diabetic chronic kidney disease: Secondary | ICD-10-CM | POA: Diagnosis not present

## 2024-06-28 DIAGNOSIS — Z4932 Encounter for adequacy testing for peritoneal dialysis: Secondary | ICD-10-CM | POA: Diagnosis not present

## 2024-06-29 DIAGNOSIS — Z992 Dependence on renal dialysis: Secondary | ICD-10-CM | POA: Diagnosis not present

## 2024-06-29 DIAGNOSIS — E878 Other disorders of electrolyte and fluid balance, not elsewhere classified: Secondary | ICD-10-CM | POA: Diagnosis not present

## 2024-06-29 DIAGNOSIS — N2581 Secondary hyperparathyroidism of renal origin: Secondary | ICD-10-CM | POA: Diagnosis not present

## 2024-06-29 DIAGNOSIS — E1122 Type 2 diabetes mellitus with diabetic chronic kidney disease: Secondary | ICD-10-CM | POA: Diagnosis not present

## 2024-06-29 DIAGNOSIS — N186 End stage renal disease: Secondary | ICD-10-CM | POA: Diagnosis not present

## 2024-06-29 DIAGNOSIS — E440 Moderate protein-calorie malnutrition: Secondary | ICD-10-CM | POA: Diagnosis not present

## 2024-06-29 DIAGNOSIS — D631 Anemia in chronic kidney disease: Secondary | ICD-10-CM | POA: Diagnosis not present

## 2024-06-29 DIAGNOSIS — K7689 Other specified diseases of liver: Secondary | ICD-10-CM | POA: Diagnosis not present

## 2024-06-29 DIAGNOSIS — Z4932 Encounter for adequacy testing for peritoneal dialysis: Secondary | ICD-10-CM | POA: Diagnosis not present

## 2024-06-29 DIAGNOSIS — N2589 Other disorders resulting from impaired renal tubular function: Secondary | ICD-10-CM | POA: Diagnosis not present

## 2024-06-29 DIAGNOSIS — Z79899 Other long term (current) drug therapy: Secondary | ICD-10-CM | POA: Diagnosis not present

## 2024-06-29 DIAGNOSIS — D509 Iron deficiency anemia, unspecified: Secondary | ICD-10-CM | POA: Diagnosis not present

## 2024-06-30 DIAGNOSIS — E440 Moderate protein-calorie malnutrition: Secondary | ICD-10-CM | POA: Diagnosis not present

## 2024-06-30 DIAGNOSIS — D509 Iron deficiency anemia, unspecified: Secondary | ICD-10-CM | POA: Diagnosis not present

## 2024-06-30 DIAGNOSIS — N2589 Other disorders resulting from impaired renal tubular function: Secondary | ICD-10-CM | POA: Diagnosis not present

## 2024-06-30 DIAGNOSIS — D631 Anemia in chronic kidney disease: Secondary | ICD-10-CM | POA: Diagnosis not present

## 2024-06-30 DIAGNOSIS — K7689 Other specified diseases of liver: Secondary | ICD-10-CM | POA: Diagnosis not present

## 2024-06-30 DIAGNOSIS — E878 Other disorders of electrolyte and fluid balance, not elsewhere classified: Secondary | ICD-10-CM | POA: Diagnosis not present

## 2024-06-30 DIAGNOSIS — N186 End stage renal disease: Secondary | ICD-10-CM | POA: Diagnosis not present

## 2024-06-30 DIAGNOSIS — Z79899 Other long term (current) drug therapy: Secondary | ICD-10-CM | POA: Diagnosis not present

## 2024-06-30 DIAGNOSIS — N2581 Secondary hyperparathyroidism of renal origin: Secondary | ICD-10-CM | POA: Diagnosis not present

## 2024-06-30 DIAGNOSIS — E1122 Type 2 diabetes mellitus with diabetic chronic kidney disease: Secondary | ICD-10-CM | POA: Diagnosis not present

## 2024-06-30 DIAGNOSIS — Z992 Dependence on renal dialysis: Secondary | ICD-10-CM | POA: Diagnosis not present

## 2024-06-30 DIAGNOSIS — Z4932 Encounter for adequacy testing for peritoneal dialysis: Secondary | ICD-10-CM | POA: Diagnosis not present

## 2024-07-01 DIAGNOSIS — D509 Iron deficiency anemia, unspecified: Secondary | ICD-10-CM | POA: Diagnosis not present

## 2024-07-01 DIAGNOSIS — Z992 Dependence on renal dialysis: Secondary | ICD-10-CM | POA: Diagnosis not present

## 2024-07-01 DIAGNOSIS — Z79899 Other long term (current) drug therapy: Secondary | ICD-10-CM | POA: Diagnosis not present

## 2024-07-01 DIAGNOSIS — N186 End stage renal disease: Secondary | ICD-10-CM | POA: Diagnosis not present

## 2024-07-01 DIAGNOSIS — E878 Other disorders of electrolyte and fluid balance, not elsewhere classified: Secondary | ICD-10-CM | POA: Diagnosis not present

## 2024-07-01 DIAGNOSIS — N2589 Other disorders resulting from impaired renal tubular function: Secondary | ICD-10-CM | POA: Diagnosis not present

## 2024-07-01 DIAGNOSIS — D631 Anemia in chronic kidney disease: Secondary | ICD-10-CM | POA: Diagnosis not present

## 2024-07-01 DIAGNOSIS — E440 Moderate protein-calorie malnutrition: Secondary | ICD-10-CM | POA: Diagnosis not present

## 2024-07-01 DIAGNOSIS — Z4932 Encounter for adequacy testing for peritoneal dialysis: Secondary | ICD-10-CM | POA: Diagnosis not present

## 2024-07-01 DIAGNOSIS — K7689 Other specified diseases of liver: Secondary | ICD-10-CM | POA: Diagnosis not present

## 2024-07-01 DIAGNOSIS — E1122 Type 2 diabetes mellitus with diabetic chronic kidney disease: Secondary | ICD-10-CM | POA: Diagnosis not present

## 2024-07-01 DIAGNOSIS — N2581 Secondary hyperparathyroidism of renal origin: Secondary | ICD-10-CM | POA: Diagnosis not present

## 2024-07-02 DIAGNOSIS — N2581 Secondary hyperparathyroidism of renal origin: Secondary | ICD-10-CM | POA: Diagnosis not present

## 2024-07-02 DIAGNOSIS — E440 Moderate protein-calorie malnutrition: Secondary | ICD-10-CM | POA: Diagnosis not present

## 2024-07-02 DIAGNOSIS — J9612 Chronic respiratory failure with hypercapnia: Secondary | ICD-10-CM | POA: Diagnosis not present

## 2024-07-02 DIAGNOSIS — Z4932 Encounter for adequacy testing for peritoneal dialysis: Secondary | ICD-10-CM | POA: Diagnosis not present

## 2024-07-02 DIAGNOSIS — K7689 Other specified diseases of liver: Secondary | ICD-10-CM | POA: Diagnosis not present

## 2024-07-02 DIAGNOSIS — G4733 Obstructive sleep apnea (adult) (pediatric): Secondary | ICD-10-CM | POA: Diagnosis not present

## 2024-07-02 DIAGNOSIS — Z79899 Other long term (current) drug therapy: Secondary | ICD-10-CM | POA: Diagnosis not present

## 2024-07-02 DIAGNOSIS — E1122 Type 2 diabetes mellitus with diabetic chronic kidney disease: Secondary | ICD-10-CM | POA: Diagnosis not present

## 2024-07-02 DIAGNOSIS — D509 Iron deficiency anemia, unspecified: Secondary | ICD-10-CM | POA: Diagnosis not present

## 2024-07-02 DIAGNOSIS — N2589 Other disorders resulting from impaired renal tubular function: Secondary | ICD-10-CM | POA: Diagnosis not present

## 2024-07-02 DIAGNOSIS — Z992 Dependence on renal dialysis: Secondary | ICD-10-CM | POA: Diagnosis not present

## 2024-07-02 DIAGNOSIS — D631 Anemia in chronic kidney disease: Secondary | ICD-10-CM | POA: Diagnosis not present

## 2024-07-02 DIAGNOSIS — N186 End stage renal disease: Secondary | ICD-10-CM | POA: Diagnosis not present

## 2024-07-02 DIAGNOSIS — E878 Other disorders of electrolyte and fluid balance, not elsewhere classified: Secondary | ICD-10-CM | POA: Diagnosis not present

## 2024-07-02 DIAGNOSIS — J9610 Chronic respiratory failure, unspecified whether with hypoxia or hypercapnia: Secondary | ICD-10-CM | POA: Diagnosis not present

## 2024-07-03 DIAGNOSIS — E878 Other disorders of electrolyte and fluid balance, not elsewhere classified: Secondary | ICD-10-CM | POA: Diagnosis not present

## 2024-07-03 DIAGNOSIS — K7689 Other specified diseases of liver: Secondary | ICD-10-CM | POA: Diagnosis not present

## 2024-07-03 DIAGNOSIS — N186 End stage renal disease: Secondary | ICD-10-CM | POA: Diagnosis not present

## 2024-07-03 DIAGNOSIS — N2589 Other disorders resulting from impaired renal tubular function: Secondary | ICD-10-CM | POA: Diagnosis not present

## 2024-07-03 DIAGNOSIS — D631 Anemia in chronic kidney disease: Secondary | ICD-10-CM | POA: Diagnosis not present

## 2024-07-03 DIAGNOSIS — Z992 Dependence on renal dialysis: Secondary | ICD-10-CM | POA: Diagnosis not present

## 2024-07-03 DIAGNOSIS — N2581 Secondary hyperparathyroidism of renal origin: Secondary | ICD-10-CM | POA: Diagnosis not present

## 2024-07-03 DIAGNOSIS — E1122 Type 2 diabetes mellitus with diabetic chronic kidney disease: Secondary | ICD-10-CM | POA: Diagnosis not present

## 2024-07-03 DIAGNOSIS — E440 Moderate protein-calorie malnutrition: Secondary | ICD-10-CM | POA: Diagnosis not present

## 2024-07-03 DIAGNOSIS — Z4932 Encounter for adequacy testing for peritoneal dialysis: Secondary | ICD-10-CM | POA: Diagnosis not present

## 2024-07-03 DIAGNOSIS — Z79899 Other long term (current) drug therapy: Secondary | ICD-10-CM | POA: Diagnosis not present

## 2024-07-03 DIAGNOSIS — D509 Iron deficiency anemia, unspecified: Secondary | ICD-10-CM | POA: Diagnosis not present

## 2024-07-04 DIAGNOSIS — E1122 Type 2 diabetes mellitus with diabetic chronic kidney disease: Secondary | ICD-10-CM | POA: Diagnosis not present

## 2024-07-04 DIAGNOSIS — N2589 Other disorders resulting from impaired renal tubular function: Secondary | ICD-10-CM | POA: Diagnosis not present

## 2024-07-04 DIAGNOSIS — Z4932 Encounter for adequacy testing for peritoneal dialysis: Secondary | ICD-10-CM | POA: Diagnosis not present

## 2024-07-04 DIAGNOSIS — N186 End stage renal disease: Secondary | ICD-10-CM | POA: Diagnosis not present

## 2024-07-04 DIAGNOSIS — E440 Moderate protein-calorie malnutrition: Secondary | ICD-10-CM | POA: Diagnosis not present

## 2024-07-04 DIAGNOSIS — E878 Other disorders of electrolyte and fluid balance, not elsewhere classified: Secondary | ICD-10-CM | POA: Diagnosis not present

## 2024-07-04 DIAGNOSIS — N2581 Secondary hyperparathyroidism of renal origin: Secondary | ICD-10-CM | POA: Diagnosis not present

## 2024-07-04 DIAGNOSIS — K7689 Other specified diseases of liver: Secondary | ICD-10-CM | POA: Diagnosis not present

## 2024-07-04 DIAGNOSIS — Z992 Dependence on renal dialysis: Secondary | ICD-10-CM | POA: Diagnosis not present

## 2024-07-04 DIAGNOSIS — Z79899 Other long term (current) drug therapy: Secondary | ICD-10-CM | POA: Diagnosis not present

## 2024-07-04 DIAGNOSIS — D631 Anemia in chronic kidney disease: Secondary | ICD-10-CM | POA: Diagnosis not present

## 2024-07-04 DIAGNOSIS — D509 Iron deficiency anemia, unspecified: Secondary | ICD-10-CM | POA: Diagnosis not present

## 2024-07-05 DIAGNOSIS — K7689 Other specified diseases of liver: Secondary | ICD-10-CM | POA: Diagnosis not present

## 2024-07-05 DIAGNOSIS — E1122 Type 2 diabetes mellitus with diabetic chronic kidney disease: Secondary | ICD-10-CM | POA: Diagnosis not present

## 2024-07-05 DIAGNOSIS — E440 Moderate protein-calorie malnutrition: Secondary | ICD-10-CM | POA: Diagnosis not present

## 2024-07-05 DIAGNOSIS — Z79899 Other long term (current) drug therapy: Secondary | ICD-10-CM | POA: Diagnosis not present

## 2024-07-05 DIAGNOSIS — E878 Other disorders of electrolyte and fluid balance, not elsewhere classified: Secondary | ICD-10-CM | POA: Diagnosis not present

## 2024-07-05 DIAGNOSIS — D509 Iron deficiency anemia, unspecified: Secondary | ICD-10-CM | POA: Diagnosis not present

## 2024-07-05 DIAGNOSIS — N186 End stage renal disease: Secondary | ICD-10-CM | POA: Diagnosis not present

## 2024-07-05 DIAGNOSIS — N2589 Other disorders resulting from impaired renal tubular function: Secondary | ICD-10-CM | POA: Diagnosis not present

## 2024-07-05 DIAGNOSIS — D631 Anemia in chronic kidney disease: Secondary | ICD-10-CM | POA: Diagnosis not present

## 2024-07-05 DIAGNOSIS — Z992 Dependence on renal dialysis: Secondary | ICD-10-CM | POA: Diagnosis not present

## 2024-07-05 DIAGNOSIS — Z4932 Encounter for adequacy testing for peritoneal dialysis: Secondary | ICD-10-CM | POA: Diagnosis not present

## 2024-07-05 DIAGNOSIS — N2581 Secondary hyperparathyroidism of renal origin: Secondary | ICD-10-CM | POA: Diagnosis not present

## 2024-07-06 DIAGNOSIS — E1122 Type 2 diabetes mellitus with diabetic chronic kidney disease: Secondary | ICD-10-CM | POA: Diagnosis not present

## 2024-07-06 DIAGNOSIS — N2589 Other disorders resulting from impaired renal tubular function: Secondary | ICD-10-CM | POA: Diagnosis not present

## 2024-07-06 DIAGNOSIS — D509 Iron deficiency anemia, unspecified: Secondary | ICD-10-CM | POA: Diagnosis not present

## 2024-07-06 DIAGNOSIS — Z4932 Encounter for adequacy testing for peritoneal dialysis: Secondary | ICD-10-CM | POA: Diagnosis not present

## 2024-07-06 DIAGNOSIS — E440 Moderate protein-calorie malnutrition: Secondary | ICD-10-CM | POA: Diagnosis not present

## 2024-07-06 DIAGNOSIS — Z79899 Other long term (current) drug therapy: Secondary | ICD-10-CM | POA: Diagnosis not present

## 2024-07-06 DIAGNOSIS — N186 End stage renal disease: Secondary | ICD-10-CM | POA: Diagnosis not present

## 2024-07-06 DIAGNOSIS — K7689 Other specified diseases of liver: Secondary | ICD-10-CM | POA: Diagnosis not present

## 2024-07-06 DIAGNOSIS — Z992 Dependence on renal dialysis: Secondary | ICD-10-CM | POA: Diagnosis not present

## 2024-07-06 DIAGNOSIS — E878 Other disorders of electrolyte and fluid balance, not elsewhere classified: Secondary | ICD-10-CM | POA: Diagnosis not present

## 2024-07-06 DIAGNOSIS — N2581 Secondary hyperparathyroidism of renal origin: Secondary | ICD-10-CM | POA: Diagnosis not present

## 2024-07-06 DIAGNOSIS — D631 Anemia in chronic kidney disease: Secondary | ICD-10-CM | POA: Diagnosis not present

## 2024-07-07 ENCOUNTER — Encounter (HOSPITAL_BASED_OUTPATIENT_CLINIC_OR_DEPARTMENT_OTHER)

## 2024-07-07 ENCOUNTER — Ambulatory Visit: Admitting: Primary Care

## 2024-07-07 DIAGNOSIS — E878 Other disorders of electrolyte and fluid balance, not elsewhere classified: Secondary | ICD-10-CM | POA: Diagnosis not present

## 2024-07-07 DIAGNOSIS — E440 Moderate protein-calorie malnutrition: Secondary | ICD-10-CM | POA: Diagnosis not present

## 2024-07-07 DIAGNOSIS — E1122 Type 2 diabetes mellitus with diabetic chronic kidney disease: Secondary | ICD-10-CM | POA: Diagnosis not present

## 2024-07-07 DIAGNOSIS — Z4932 Encounter for adequacy testing for peritoneal dialysis: Secondary | ICD-10-CM | POA: Diagnosis not present

## 2024-07-07 DIAGNOSIS — D509 Iron deficiency anemia, unspecified: Secondary | ICD-10-CM | POA: Diagnosis not present

## 2024-07-07 DIAGNOSIS — D631 Anemia in chronic kidney disease: Secondary | ICD-10-CM | POA: Diagnosis not present

## 2024-07-07 DIAGNOSIS — N2581 Secondary hyperparathyroidism of renal origin: Secondary | ICD-10-CM | POA: Diagnosis not present

## 2024-07-07 DIAGNOSIS — K7689 Other specified diseases of liver: Secondary | ICD-10-CM | POA: Diagnosis not present

## 2024-07-07 DIAGNOSIS — Z992 Dependence on renal dialysis: Secondary | ICD-10-CM | POA: Diagnosis not present

## 2024-07-07 DIAGNOSIS — Z79899 Other long term (current) drug therapy: Secondary | ICD-10-CM | POA: Diagnosis not present

## 2024-07-07 DIAGNOSIS — N186 End stage renal disease: Secondary | ICD-10-CM | POA: Diagnosis not present

## 2024-07-07 DIAGNOSIS — N2589 Other disorders resulting from impaired renal tubular function: Secondary | ICD-10-CM | POA: Diagnosis not present

## 2024-07-08 DIAGNOSIS — K7689 Other specified diseases of liver: Secondary | ICD-10-CM | POA: Diagnosis not present

## 2024-07-08 DIAGNOSIS — D509 Iron deficiency anemia, unspecified: Secondary | ICD-10-CM | POA: Diagnosis not present

## 2024-07-08 DIAGNOSIS — E440 Moderate protein-calorie malnutrition: Secondary | ICD-10-CM | POA: Diagnosis not present

## 2024-07-08 DIAGNOSIS — Z992 Dependence on renal dialysis: Secondary | ICD-10-CM | POA: Diagnosis not present

## 2024-07-08 DIAGNOSIS — Z79899 Other long term (current) drug therapy: Secondary | ICD-10-CM | POA: Diagnosis not present

## 2024-07-08 DIAGNOSIS — E878 Other disorders of electrolyte and fluid balance, not elsewhere classified: Secondary | ICD-10-CM | POA: Diagnosis not present

## 2024-07-08 DIAGNOSIS — D631 Anemia in chronic kidney disease: Secondary | ICD-10-CM | POA: Diagnosis not present

## 2024-07-08 DIAGNOSIS — N2589 Other disorders resulting from impaired renal tubular function: Secondary | ICD-10-CM | POA: Diagnosis not present

## 2024-07-08 DIAGNOSIS — N186 End stage renal disease: Secondary | ICD-10-CM | POA: Diagnosis not present

## 2024-07-08 DIAGNOSIS — E1122 Type 2 diabetes mellitus with diabetic chronic kidney disease: Secondary | ICD-10-CM | POA: Diagnosis not present

## 2024-07-08 DIAGNOSIS — Z4932 Encounter for adequacy testing for peritoneal dialysis: Secondary | ICD-10-CM | POA: Diagnosis not present

## 2024-07-08 DIAGNOSIS — N2581 Secondary hyperparathyroidism of renal origin: Secondary | ICD-10-CM | POA: Diagnosis not present

## 2024-07-09 DIAGNOSIS — Z992 Dependence on renal dialysis: Secondary | ICD-10-CM | POA: Diagnosis not present

## 2024-07-09 DIAGNOSIS — N2581 Secondary hyperparathyroidism of renal origin: Secondary | ICD-10-CM | POA: Diagnosis not present

## 2024-07-09 DIAGNOSIS — Z79899 Other long term (current) drug therapy: Secondary | ICD-10-CM | POA: Diagnosis not present

## 2024-07-09 DIAGNOSIS — N186 End stage renal disease: Secondary | ICD-10-CM | POA: Diagnosis not present

## 2024-07-09 DIAGNOSIS — E878 Other disorders of electrolyte and fluid balance, not elsewhere classified: Secondary | ICD-10-CM | POA: Diagnosis not present

## 2024-07-09 DIAGNOSIS — D631 Anemia in chronic kidney disease: Secondary | ICD-10-CM | POA: Diagnosis not present

## 2024-07-09 DIAGNOSIS — E440 Moderate protein-calorie malnutrition: Secondary | ICD-10-CM | POA: Diagnosis not present

## 2024-07-09 DIAGNOSIS — Z4932 Encounter for adequacy testing for peritoneal dialysis: Secondary | ICD-10-CM | POA: Diagnosis not present

## 2024-07-09 DIAGNOSIS — N2589 Other disorders resulting from impaired renal tubular function: Secondary | ICD-10-CM | POA: Diagnosis not present

## 2024-07-09 DIAGNOSIS — K7689 Other specified diseases of liver: Secondary | ICD-10-CM | POA: Diagnosis not present

## 2024-07-09 DIAGNOSIS — D509 Iron deficiency anemia, unspecified: Secondary | ICD-10-CM | POA: Diagnosis not present

## 2024-07-09 DIAGNOSIS — E1122 Type 2 diabetes mellitus with diabetic chronic kidney disease: Secondary | ICD-10-CM | POA: Diagnosis not present

## 2024-07-10 DIAGNOSIS — Z4932 Encounter for adequacy testing for peritoneal dialysis: Secondary | ICD-10-CM | POA: Diagnosis not present

## 2024-07-10 DIAGNOSIS — D631 Anemia in chronic kidney disease: Secondary | ICD-10-CM | POA: Diagnosis not present

## 2024-07-10 DIAGNOSIS — D509 Iron deficiency anemia, unspecified: Secondary | ICD-10-CM | POA: Diagnosis not present

## 2024-07-10 DIAGNOSIS — N186 End stage renal disease: Secondary | ICD-10-CM | POA: Diagnosis not present

## 2024-07-10 DIAGNOSIS — Z992 Dependence on renal dialysis: Secondary | ICD-10-CM | POA: Diagnosis not present

## 2024-07-10 DIAGNOSIS — K7689 Other specified diseases of liver: Secondary | ICD-10-CM | POA: Diagnosis not present

## 2024-07-10 DIAGNOSIS — E440 Moderate protein-calorie malnutrition: Secondary | ICD-10-CM | POA: Diagnosis not present

## 2024-07-10 DIAGNOSIS — N2581 Secondary hyperparathyroidism of renal origin: Secondary | ICD-10-CM | POA: Diagnosis not present

## 2024-07-10 DIAGNOSIS — N2589 Other disorders resulting from impaired renal tubular function: Secondary | ICD-10-CM | POA: Diagnosis not present

## 2024-07-10 DIAGNOSIS — Z79899 Other long term (current) drug therapy: Secondary | ICD-10-CM | POA: Diagnosis not present

## 2024-07-10 DIAGNOSIS — E878 Other disorders of electrolyte and fluid balance, not elsewhere classified: Secondary | ICD-10-CM | POA: Diagnosis not present

## 2024-07-10 DIAGNOSIS — E1122 Type 2 diabetes mellitus with diabetic chronic kidney disease: Secondary | ICD-10-CM | POA: Diagnosis not present

## 2024-07-11 DIAGNOSIS — D509 Iron deficiency anemia, unspecified: Secondary | ICD-10-CM | POA: Diagnosis not present

## 2024-07-11 DIAGNOSIS — Z4932 Encounter for adequacy testing for peritoneal dialysis: Secondary | ICD-10-CM | POA: Diagnosis not present

## 2024-07-11 DIAGNOSIS — E878 Other disorders of electrolyte and fluid balance, not elsewhere classified: Secondary | ICD-10-CM | POA: Diagnosis not present

## 2024-07-11 DIAGNOSIS — Z79899 Other long term (current) drug therapy: Secondary | ICD-10-CM | POA: Diagnosis not present

## 2024-07-11 DIAGNOSIS — N2589 Other disorders resulting from impaired renal tubular function: Secondary | ICD-10-CM | POA: Diagnosis not present

## 2024-07-11 DIAGNOSIS — K7689 Other specified diseases of liver: Secondary | ICD-10-CM | POA: Diagnosis not present

## 2024-07-11 DIAGNOSIS — E1122 Type 2 diabetes mellitus with diabetic chronic kidney disease: Secondary | ICD-10-CM | POA: Diagnosis not present

## 2024-07-11 DIAGNOSIS — D631 Anemia in chronic kidney disease: Secondary | ICD-10-CM | POA: Diagnosis not present

## 2024-07-11 DIAGNOSIS — E440 Moderate protein-calorie malnutrition: Secondary | ICD-10-CM | POA: Diagnosis not present

## 2024-07-11 DIAGNOSIS — N2581 Secondary hyperparathyroidism of renal origin: Secondary | ICD-10-CM | POA: Diagnosis not present

## 2024-07-11 DIAGNOSIS — N186 End stage renal disease: Secondary | ICD-10-CM | POA: Diagnosis not present

## 2024-07-11 DIAGNOSIS — Z992 Dependence on renal dialysis: Secondary | ICD-10-CM | POA: Diagnosis not present

## 2024-07-12 DIAGNOSIS — D631 Anemia in chronic kidney disease: Secondary | ICD-10-CM | POA: Diagnosis not present

## 2024-07-12 DIAGNOSIS — E440 Moderate protein-calorie malnutrition: Secondary | ICD-10-CM | POA: Diagnosis not present

## 2024-07-12 DIAGNOSIS — K7689 Other specified diseases of liver: Secondary | ICD-10-CM | POA: Diagnosis not present

## 2024-07-12 DIAGNOSIS — Z79899 Other long term (current) drug therapy: Secondary | ICD-10-CM | POA: Diagnosis not present

## 2024-07-12 DIAGNOSIS — N2589 Other disorders resulting from impaired renal tubular function: Secondary | ICD-10-CM | POA: Diagnosis not present

## 2024-07-12 DIAGNOSIS — E878 Other disorders of electrolyte and fluid balance, not elsewhere classified: Secondary | ICD-10-CM | POA: Diagnosis not present

## 2024-07-12 DIAGNOSIS — Z4932 Encounter for adequacy testing for peritoneal dialysis: Secondary | ICD-10-CM | POA: Diagnosis not present

## 2024-07-12 DIAGNOSIS — D509 Iron deficiency anemia, unspecified: Secondary | ICD-10-CM | POA: Diagnosis not present

## 2024-07-12 DIAGNOSIS — Z992 Dependence on renal dialysis: Secondary | ICD-10-CM | POA: Diagnosis not present

## 2024-07-12 DIAGNOSIS — N186 End stage renal disease: Secondary | ICD-10-CM | POA: Diagnosis not present

## 2024-07-12 DIAGNOSIS — E1122 Type 2 diabetes mellitus with diabetic chronic kidney disease: Secondary | ICD-10-CM | POA: Diagnosis not present

## 2024-07-12 DIAGNOSIS — N2581 Secondary hyperparathyroidism of renal origin: Secondary | ICD-10-CM | POA: Diagnosis not present

## 2024-07-13 DIAGNOSIS — Z992 Dependence on renal dialysis: Secondary | ICD-10-CM | POA: Diagnosis not present

## 2024-07-13 DIAGNOSIS — K7689 Other specified diseases of liver: Secondary | ICD-10-CM | POA: Diagnosis not present

## 2024-07-13 DIAGNOSIS — N186 End stage renal disease: Secondary | ICD-10-CM | POA: Diagnosis not present

## 2024-07-13 DIAGNOSIS — Z4932 Encounter for adequacy testing for peritoneal dialysis: Secondary | ICD-10-CM | POA: Diagnosis not present

## 2024-07-13 DIAGNOSIS — D631 Anemia in chronic kidney disease: Secondary | ICD-10-CM | POA: Diagnosis not present

## 2024-07-13 DIAGNOSIS — N2589 Other disorders resulting from impaired renal tubular function: Secondary | ICD-10-CM | POA: Diagnosis not present

## 2024-07-13 DIAGNOSIS — N2581 Secondary hyperparathyroidism of renal origin: Secondary | ICD-10-CM | POA: Diagnosis not present

## 2024-07-13 DIAGNOSIS — E878 Other disorders of electrolyte and fluid balance, not elsewhere classified: Secondary | ICD-10-CM | POA: Diagnosis not present

## 2024-07-13 DIAGNOSIS — E1122 Type 2 diabetes mellitus with diabetic chronic kidney disease: Secondary | ICD-10-CM | POA: Diagnosis not present

## 2024-07-13 DIAGNOSIS — E440 Moderate protein-calorie malnutrition: Secondary | ICD-10-CM | POA: Diagnosis not present

## 2024-07-13 DIAGNOSIS — D509 Iron deficiency anemia, unspecified: Secondary | ICD-10-CM | POA: Diagnosis not present

## 2024-07-13 DIAGNOSIS — Z79899 Other long term (current) drug therapy: Secondary | ICD-10-CM | POA: Diagnosis not present

## 2024-07-14 DIAGNOSIS — N2581 Secondary hyperparathyroidism of renal origin: Secondary | ICD-10-CM | POA: Diagnosis not present

## 2024-07-14 DIAGNOSIS — D631 Anemia in chronic kidney disease: Secondary | ICD-10-CM | POA: Diagnosis not present

## 2024-07-14 DIAGNOSIS — N2589 Other disorders resulting from impaired renal tubular function: Secondary | ICD-10-CM | POA: Diagnosis not present

## 2024-07-14 DIAGNOSIS — Z992 Dependence on renal dialysis: Secondary | ICD-10-CM | POA: Diagnosis not present

## 2024-07-14 DIAGNOSIS — Z4932 Encounter for adequacy testing for peritoneal dialysis: Secondary | ICD-10-CM | POA: Diagnosis not present

## 2024-07-14 DIAGNOSIS — E1122 Type 2 diabetes mellitus with diabetic chronic kidney disease: Secondary | ICD-10-CM | POA: Diagnosis not present

## 2024-07-14 DIAGNOSIS — N186 End stage renal disease: Secondary | ICD-10-CM | POA: Diagnosis not present

## 2024-07-14 DIAGNOSIS — Z79899 Other long term (current) drug therapy: Secondary | ICD-10-CM | POA: Diagnosis not present

## 2024-07-14 DIAGNOSIS — D509 Iron deficiency anemia, unspecified: Secondary | ICD-10-CM | POA: Diagnosis not present

## 2024-07-14 DIAGNOSIS — K7689 Other specified diseases of liver: Secondary | ICD-10-CM | POA: Diagnosis not present

## 2024-07-14 DIAGNOSIS — E440 Moderate protein-calorie malnutrition: Secondary | ICD-10-CM | POA: Diagnosis not present

## 2024-07-14 DIAGNOSIS — E878 Other disorders of electrolyte and fluid balance, not elsewhere classified: Secondary | ICD-10-CM | POA: Diagnosis not present

## 2024-07-15 DIAGNOSIS — E1122 Type 2 diabetes mellitus with diabetic chronic kidney disease: Secondary | ICD-10-CM | POA: Diagnosis not present

## 2024-07-15 DIAGNOSIS — N186 End stage renal disease: Secondary | ICD-10-CM | POA: Diagnosis not present

## 2024-07-15 DIAGNOSIS — Z4932 Encounter for adequacy testing for peritoneal dialysis: Secondary | ICD-10-CM | POA: Diagnosis not present

## 2024-07-15 DIAGNOSIS — E440 Moderate protein-calorie malnutrition: Secondary | ICD-10-CM | POA: Diagnosis not present

## 2024-07-15 DIAGNOSIS — Z992 Dependence on renal dialysis: Secondary | ICD-10-CM | POA: Diagnosis not present

## 2024-07-15 DIAGNOSIS — D631 Anemia in chronic kidney disease: Secondary | ICD-10-CM | POA: Diagnosis not present

## 2024-07-15 DIAGNOSIS — D509 Iron deficiency anemia, unspecified: Secondary | ICD-10-CM | POA: Diagnosis not present

## 2024-07-15 DIAGNOSIS — K7689 Other specified diseases of liver: Secondary | ICD-10-CM | POA: Diagnosis not present

## 2024-07-15 DIAGNOSIS — Z79899 Other long term (current) drug therapy: Secondary | ICD-10-CM | POA: Diagnosis not present

## 2024-07-15 DIAGNOSIS — E878 Other disorders of electrolyte and fluid balance, not elsewhere classified: Secondary | ICD-10-CM | POA: Diagnosis not present

## 2024-07-15 DIAGNOSIS — N2589 Other disorders resulting from impaired renal tubular function: Secondary | ICD-10-CM | POA: Diagnosis not present

## 2024-07-15 DIAGNOSIS — N2581 Secondary hyperparathyroidism of renal origin: Secondary | ICD-10-CM | POA: Diagnosis not present

## 2024-07-16 DIAGNOSIS — K7689 Other specified diseases of liver: Secondary | ICD-10-CM | POA: Diagnosis not present

## 2024-07-16 DIAGNOSIS — N2589 Other disorders resulting from impaired renal tubular function: Secondary | ICD-10-CM | POA: Diagnosis not present

## 2024-07-16 DIAGNOSIS — N186 End stage renal disease: Secondary | ICD-10-CM | POA: Diagnosis not present

## 2024-07-16 DIAGNOSIS — N2581 Secondary hyperparathyroidism of renal origin: Secondary | ICD-10-CM | POA: Diagnosis not present

## 2024-07-16 DIAGNOSIS — E1122 Type 2 diabetes mellitus with diabetic chronic kidney disease: Secondary | ICD-10-CM | POA: Diagnosis not present

## 2024-07-16 DIAGNOSIS — E440 Moderate protein-calorie malnutrition: Secondary | ICD-10-CM | POA: Diagnosis not present

## 2024-07-16 DIAGNOSIS — Z4932 Encounter for adequacy testing for peritoneal dialysis: Secondary | ICD-10-CM | POA: Diagnosis not present

## 2024-07-16 DIAGNOSIS — E878 Other disorders of electrolyte and fluid balance, not elsewhere classified: Secondary | ICD-10-CM | POA: Diagnosis not present

## 2024-07-16 DIAGNOSIS — Z79899 Other long term (current) drug therapy: Secondary | ICD-10-CM | POA: Diagnosis not present

## 2024-07-16 DIAGNOSIS — D509 Iron deficiency anemia, unspecified: Secondary | ICD-10-CM | POA: Diagnosis not present

## 2024-07-16 DIAGNOSIS — D631 Anemia in chronic kidney disease: Secondary | ICD-10-CM | POA: Diagnosis not present

## 2024-07-16 DIAGNOSIS — Z992 Dependence on renal dialysis: Secondary | ICD-10-CM | POA: Diagnosis not present

## 2024-07-17 DIAGNOSIS — Z4932 Encounter for adequacy testing for peritoneal dialysis: Secondary | ICD-10-CM | POA: Diagnosis not present

## 2024-07-17 DIAGNOSIS — Z79899 Other long term (current) drug therapy: Secondary | ICD-10-CM | POA: Diagnosis not present

## 2024-07-17 DIAGNOSIS — N2581 Secondary hyperparathyroidism of renal origin: Secondary | ICD-10-CM | POA: Diagnosis not present

## 2024-07-17 DIAGNOSIS — E440 Moderate protein-calorie malnutrition: Secondary | ICD-10-CM | POA: Diagnosis not present

## 2024-07-17 DIAGNOSIS — N2589 Other disorders resulting from impaired renal tubular function: Secondary | ICD-10-CM | POA: Diagnosis not present

## 2024-07-17 DIAGNOSIS — D631 Anemia in chronic kidney disease: Secondary | ICD-10-CM | POA: Diagnosis not present

## 2024-07-17 DIAGNOSIS — Z992 Dependence on renal dialysis: Secondary | ICD-10-CM | POA: Diagnosis not present

## 2024-07-17 DIAGNOSIS — N186 End stage renal disease: Secondary | ICD-10-CM | POA: Diagnosis not present

## 2024-07-17 DIAGNOSIS — D509 Iron deficiency anemia, unspecified: Secondary | ICD-10-CM | POA: Diagnosis not present

## 2024-07-17 DIAGNOSIS — E1122 Type 2 diabetes mellitus with diabetic chronic kidney disease: Secondary | ICD-10-CM | POA: Diagnosis not present

## 2024-07-17 DIAGNOSIS — E878 Other disorders of electrolyte and fluid balance, not elsewhere classified: Secondary | ICD-10-CM | POA: Diagnosis not present

## 2024-07-17 DIAGNOSIS — K7689 Other specified diseases of liver: Secondary | ICD-10-CM | POA: Diagnosis not present

## 2024-07-18 DIAGNOSIS — N2589 Other disorders resulting from impaired renal tubular function: Secondary | ICD-10-CM | POA: Diagnosis not present

## 2024-07-18 DIAGNOSIS — D509 Iron deficiency anemia, unspecified: Secondary | ICD-10-CM | POA: Diagnosis not present

## 2024-07-18 DIAGNOSIS — K7689 Other specified diseases of liver: Secondary | ICD-10-CM | POA: Diagnosis not present

## 2024-07-18 DIAGNOSIS — E1122 Type 2 diabetes mellitus with diabetic chronic kidney disease: Secondary | ICD-10-CM | POA: Diagnosis not present

## 2024-07-18 DIAGNOSIS — E440 Moderate protein-calorie malnutrition: Secondary | ICD-10-CM | POA: Diagnosis not present

## 2024-07-18 DIAGNOSIS — D631 Anemia in chronic kidney disease: Secondary | ICD-10-CM | POA: Diagnosis not present

## 2024-07-18 DIAGNOSIS — Z4932 Encounter for adequacy testing for peritoneal dialysis: Secondary | ICD-10-CM | POA: Diagnosis not present

## 2024-07-18 DIAGNOSIS — N186 End stage renal disease: Secondary | ICD-10-CM | POA: Diagnosis not present

## 2024-07-18 DIAGNOSIS — E878 Other disorders of electrolyte and fluid balance, not elsewhere classified: Secondary | ICD-10-CM | POA: Diagnosis not present

## 2024-07-18 DIAGNOSIS — Z992 Dependence on renal dialysis: Secondary | ICD-10-CM | POA: Diagnosis not present

## 2024-07-18 DIAGNOSIS — N2581 Secondary hyperparathyroidism of renal origin: Secondary | ICD-10-CM | POA: Diagnosis not present

## 2024-07-18 DIAGNOSIS — Z79899 Other long term (current) drug therapy: Secondary | ICD-10-CM | POA: Diagnosis not present

## 2024-07-19 DIAGNOSIS — E440 Moderate protein-calorie malnutrition: Secondary | ICD-10-CM | POA: Diagnosis not present

## 2024-07-19 DIAGNOSIS — D631 Anemia in chronic kidney disease: Secondary | ICD-10-CM | POA: Diagnosis not present

## 2024-07-19 DIAGNOSIS — N2589 Other disorders resulting from impaired renal tubular function: Secondary | ICD-10-CM | POA: Diagnosis not present

## 2024-07-19 DIAGNOSIS — N186 End stage renal disease: Secondary | ICD-10-CM | POA: Diagnosis not present

## 2024-07-19 DIAGNOSIS — Z4932 Encounter for adequacy testing for peritoneal dialysis: Secondary | ICD-10-CM | POA: Diagnosis not present

## 2024-07-19 DIAGNOSIS — N2581 Secondary hyperparathyroidism of renal origin: Secondary | ICD-10-CM | POA: Diagnosis not present

## 2024-07-19 DIAGNOSIS — D509 Iron deficiency anemia, unspecified: Secondary | ICD-10-CM | POA: Diagnosis not present

## 2024-07-19 DIAGNOSIS — Z992 Dependence on renal dialysis: Secondary | ICD-10-CM | POA: Diagnosis not present

## 2024-07-19 DIAGNOSIS — K7689 Other specified diseases of liver: Secondary | ICD-10-CM | POA: Diagnosis not present

## 2024-07-19 DIAGNOSIS — E878 Other disorders of electrolyte and fluid balance, not elsewhere classified: Secondary | ICD-10-CM | POA: Diagnosis not present

## 2024-07-19 DIAGNOSIS — Z79899 Other long term (current) drug therapy: Secondary | ICD-10-CM | POA: Diagnosis not present

## 2024-07-19 DIAGNOSIS — E1122 Type 2 diabetes mellitus with diabetic chronic kidney disease: Secondary | ICD-10-CM | POA: Diagnosis not present

## 2024-07-20 DIAGNOSIS — N186 End stage renal disease: Secondary | ICD-10-CM | POA: Diagnosis not present

## 2024-07-20 DIAGNOSIS — K7689 Other specified diseases of liver: Secondary | ICD-10-CM | POA: Diagnosis not present

## 2024-07-20 DIAGNOSIS — N2581 Secondary hyperparathyroidism of renal origin: Secondary | ICD-10-CM | POA: Diagnosis not present

## 2024-07-20 DIAGNOSIS — E878 Other disorders of electrolyte and fluid balance, not elsewhere classified: Secondary | ICD-10-CM | POA: Diagnosis not present

## 2024-07-20 DIAGNOSIS — Z79899 Other long term (current) drug therapy: Secondary | ICD-10-CM | POA: Diagnosis not present

## 2024-07-20 DIAGNOSIS — Z992 Dependence on renal dialysis: Secondary | ICD-10-CM | POA: Diagnosis not present

## 2024-07-20 DIAGNOSIS — D631 Anemia in chronic kidney disease: Secondary | ICD-10-CM | POA: Diagnosis not present

## 2024-07-20 DIAGNOSIS — Z4932 Encounter for adequacy testing for peritoneal dialysis: Secondary | ICD-10-CM | POA: Diagnosis not present

## 2024-07-20 DIAGNOSIS — E1122 Type 2 diabetes mellitus with diabetic chronic kidney disease: Secondary | ICD-10-CM | POA: Diagnosis not present

## 2024-07-20 DIAGNOSIS — D509 Iron deficiency anemia, unspecified: Secondary | ICD-10-CM | POA: Diagnosis not present

## 2024-07-20 DIAGNOSIS — E440 Moderate protein-calorie malnutrition: Secondary | ICD-10-CM | POA: Diagnosis not present

## 2024-07-20 DIAGNOSIS — N2589 Other disorders resulting from impaired renal tubular function: Secondary | ICD-10-CM | POA: Diagnosis not present

## 2024-07-21 DIAGNOSIS — Z79899 Other long term (current) drug therapy: Secondary | ICD-10-CM | POA: Diagnosis not present

## 2024-07-21 DIAGNOSIS — N2581 Secondary hyperparathyroidism of renal origin: Secondary | ICD-10-CM | POA: Diagnosis not present

## 2024-07-21 DIAGNOSIS — N2589 Other disorders resulting from impaired renal tubular function: Secondary | ICD-10-CM | POA: Diagnosis not present

## 2024-07-21 DIAGNOSIS — Z4932 Encounter for adequacy testing for peritoneal dialysis: Secondary | ICD-10-CM | POA: Diagnosis not present

## 2024-07-21 DIAGNOSIS — E440 Moderate protein-calorie malnutrition: Secondary | ICD-10-CM | POA: Diagnosis not present

## 2024-07-21 DIAGNOSIS — E878 Other disorders of electrolyte and fluid balance, not elsewhere classified: Secondary | ICD-10-CM | POA: Diagnosis not present

## 2024-07-21 DIAGNOSIS — E1122 Type 2 diabetes mellitus with diabetic chronic kidney disease: Secondary | ICD-10-CM | POA: Diagnosis not present

## 2024-07-21 DIAGNOSIS — Z992 Dependence on renal dialysis: Secondary | ICD-10-CM | POA: Diagnosis not present

## 2024-07-21 DIAGNOSIS — D509 Iron deficiency anemia, unspecified: Secondary | ICD-10-CM | POA: Diagnosis not present

## 2024-07-21 DIAGNOSIS — N186 End stage renal disease: Secondary | ICD-10-CM | POA: Diagnosis not present

## 2024-07-21 DIAGNOSIS — D631 Anemia in chronic kidney disease: Secondary | ICD-10-CM | POA: Diagnosis not present

## 2024-07-21 DIAGNOSIS — K7689 Other specified diseases of liver: Secondary | ICD-10-CM | POA: Diagnosis not present

## 2024-07-22 DIAGNOSIS — Z79899 Other long term (current) drug therapy: Secondary | ICD-10-CM | POA: Diagnosis not present

## 2024-07-22 DIAGNOSIS — E1122 Type 2 diabetes mellitus with diabetic chronic kidney disease: Secondary | ICD-10-CM | POA: Diagnosis not present

## 2024-07-22 DIAGNOSIS — K7689 Other specified diseases of liver: Secondary | ICD-10-CM | POA: Diagnosis not present

## 2024-07-22 DIAGNOSIS — D631 Anemia in chronic kidney disease: Secondary | ICD-10-CM | POA: Diagnosis not present

## 2024-07-22 DIAGNOSIS — E878 Other disorders of electrolyte and fluid balance, not elsewhere classified: Secondary | ICD-10-CM | POA: Diagnosis not present

## 2024-07-22 DIAGNOSIS — N2589 Other disorders resulting from impaired renal tubular function: Secondary | ICD-10-CM | POA: Diagnosis not present

## 2024-07-22 DIAGNOSIS — D509 Iron deficiency anemia, unspecified: Secondary | ICD-10-CM | POA: Diagnosis not present

## 2024-07-22 DIAGNOSIS — N2581 Secondary hyperparathyroidism of renal origin: Secondary | ICD-10-CM | POA: Diagnosis not present

## 2024-07-22 DIAGNOSIS — E440 Moderate protein-calorie malnutrition: Secondary | ICD-10-CM | POA: Diagnosis not present

## 2024-07-22 DIAGNOSIS — E1129 Type 2 diabetes mellitus with other diabetic kidney complication: Secondary | ICD-10-CM | POA: Diagnosis not present

## 2024-07-22 DIAGNOSIS — Z992 Dependence on renal dialysis: Secondary | ICD-10-CM | POA: Diagnosis not present

## 2024-07-22 DIAGNOSIS — N186 End stage renal disease: Secondary | ICD-10-CM | POA: Diagnosis not present

## 2024-07-22 DIAGNOSIS — Z4932 Encounter for adequacy testing for peritoneal dialysis: Secondary | ICD-10-CM | POA: Diagnosis not present

## 2024-07-23 DIAGNOSIS — K7689 Other specified diseases of liver: Secondary | ICD-10-CM | POA: Diagnosis not present

## 2024-07-23 DIAGNOSIS — N2581 Secondary hyperparathyroidism of renal origin: Secondary | ICD-10-CM | POA: Diagnosis not present

## 2024-07-23 DIAGNOSIS — D509 Iron deficiency anemia, unspecified: Secondary | ICD-10-CM | POA: Diagnosis not present

## 2024-07-23 DIAGNOSIS — E878 Other disorders of electrolyte and fluid balance, not elsewhere classified: Secondary | ICD-10-CM | POA: Diagnosis not present

## 2024-07-23 DIAGNOSIS — E440 Moderate protein-calorie malnutrition: Secondary | ICD-10-CM | POA: Diagnosis not present

## 2024-07-23 DIAGNOSIS — D630 Anemia in neoplastic disease: Secondary | ICD-10-CM | POA: Diagnosis not present

## 2024-07-23 DIAGNOSIS — Z992 Dependence on renal dialysis: Secondary | ICD-10-CM | POA: Diagnosis not present

## 2024-07-23 DIAGNOSIS — Z4932 Encounter for adequacy testing for peritoneal dialysis: Secondary | ICD-10-CM | POA: Diagnosis not present

## 2024-07-23 DIAGNOSIS — Z79899 Other long term (current) drug therapy: Secondary | ICD-10-CM | POA: Diagnosis not present

## 2024-07-23 DIAGNOSIS — N186 End stage renal disease: Secondary | ICD-10-CM | POA: Diagnosis not present

## 2024-07-24 DIAGNOSIS — K7689 Other specified diseases of liver: Secondary | ICD-10-CM | POA: Diagnosis not present

## 2024-07-24 DIAGNOSIS — E878 Other disorders of electrolyte and fluid balance, not elsewhere classified: Secondary | ICD-10-CM | POA: Diagnosis not present

## 2024-07-24 DIAGNOSIS — D630 Anemia in neoplastic disease: Secondary | ICD-10-CM | POA: Diagnosis not present

## 2024-07-24 DIAGNOSIS — D509 Iron deficiency anemia, unspecified: Secondary | ICD-10-CM | POA: Diagnosis not present

## 2024-07-24 DIAGNOSIS — N186 End stage renal disease: Secondary | ICD-10-CM | POA: Diagnosis not present

## 2024-07-24 DIAGNOSIS — N2581 Secondary hyperparathyroidism of renal origin: Secondary | ICD-10-CM | POA: Diagnosis not present

## 2024-07-24 DIAGNOSIS — Z992 Dependence on renal dialysis: Secondary | ICD-10-CM | POA: Diagnosis not present

## 2024-07-24 DIAGNOSIS — Z79899 Other long term (current) drug therapy: Secondary | ICD-10-CM | POA: Diagnosis not present

## 2024-07-24 DIAGNOSIS — E440 Moderate protein-calorie malnutrition: Secondary | ICD-10-CM | POA: Diagnosis not present

## 2024-07-24 DIAGNOSIS — Z4932 Encounter for adequacy testing for peritoneal dialysis: Secondary | ICD-10-CM | POA: Diagnosis not present

## 2024-07-25 DIAGNOSIS — E440 Moderate protein-calorie malnutrition: Secondary | ICD-10-CM | POA: Diagnosis not present

## 2024-07-25 DIAGNOSIS — D509 Iron deficiency anemia, unspecified: Secondary | ICD-10-CM | POA: Diagnosis not present

## 2024-07-25 DIAGNOSIS — Z992 Dependence on renal dialysis: Secondary | ICD-10-CM | POA: Diagnosis not present

## 2024-07-25 DIAGNOSIS — Z79899 Other long term (current) drug therapy: Secondary | ICD-10-CM | POA: Diagnosis not present

## 2024-07-25 DIAGNOSIS — K7689 Other specified diseases of liver: Secondary | ICD-10-CM | POA: Diagnosis not present

## 2024-07-25 DIAGNOSIS — N2581 Secondary hyperparathyroidism of renal origin: Secondary | ICD-10-CM | POA: Diagnosis not present

## 2024-07-25 DIAGNOSIS — E878 Other disorders of electrolyte and fluid balance, not elsewhere classified: Secondary | ICD-10-CM | POA: Diagnosis not present

## 2024-07-25 DIAGNOSIS — D630 Anemia in neoplastic disease: Secondary | ICD-10-CM | POA: Diagnosis not present

## 2024-07-25 DIAGNOSIS — Z4932 Encounter for adequacy testing for peritoneal dialysis: Secondary | ICD-10-CM | POA: Diagnosis not present

## 2024-07-25 DIAGNOSIS — N186 End stage renal disease: Secondary | ICD-10-CM | POA: Diagnosis not present

## 2024-07-26 DIAGNOSIS — N186 End stage renal disease: Secondary | ICD-10-CM | POA: Diagnosis not present

## 2024-07-26 DIAGNOSIS — K7689 Other specified diseases of liver: Secondary | ICD-10-CM | POA: Diagnosis not present

## 2024-07-26 DIAGNOSIS — E440 Moderate protein-calorie malnutrition: Secondary | ICD-10-CM | POA: Diagnosis not present

## 2024-07-26 DIAGNOSIS — N2581 Secondary hyperparathyroidism of renal origin: Secondary | ICD-10-CM | POA: Diagnosis not present

## 2024-07-26 DIAGNOSIS — Z4932 Encounter for adequacy testing for peritoneal dialysis: Secondary | ICD-10-CM | POA: Diagnosis not present

## 2024-07-26 DIAGNOSIS — Z992 Dependence on renal dialysis: Secondary | ICD-10-CM | POA: Diagnosis not present

## 2024-07-26 DIAGNOSIS — E878 Other disorders of electrolyte and fluid balance, not elsewhere classified: Secondary | ICD-10-CM | POA: Diagnosis not present

## 2024-07-26 DIAGNOSIS — D630 Anemia in neoplastic disease: Secondary | ICD-10-CM | POA: Diagnosis not present

## 2024-07-26 DIAGNOSIS — Z79899 Other long term (current) drug therapy: Secondary | ICD-10-CM | POA: Diagnosis not present

## 2024-07-26 DIAGNOSIS — D509 Iron deficiency anemia, unspecified: Secondary | ICD-10-CM | POA: Diagnosis not present

## 2024-07-27 DIAGNOSIS — E878 Other disorders of electrolyte and fluid balance, not elsewhere classified: Secondary | ICD-10-CM | POA: Diagnosis not present

## 2024-07-27 DIAGNOSIS — Z992 Dependence on renal dialysis: Secondary | ICD-10-CM | POA: Diagnosis not present

## 2024-07-27 DIAGNOSIS — N2581 Secondary hyperparathyroidism of renal origin: Secondary | ICD-10-CM | POA: Diagnosis not present

## 2024-07-27 DIAGNOSIS — K7689 Other specified diseases of liver: Secondary | ICD-10-CM | POA: Diagnosis not present

## 2024-07-27 DIAGNOSIS — D630 Anemia in neoplastic disease: Secondary | ICD-10-CM | POA: Diagnosis not present

## 2024-07-27 DIAGNOSIS — E440 Moderate protein-calorie malnutrition: Secondary | ICD-10-CM | POA: Diagnosis not present

## 2024-07-27 DIAGNOSIS — Z79899 Other long term (current) drug therapy: Secondary | ICD-10-CM | POA: Diagnosis not present

## 2024-07-27 DIAGNOSIS — Z4932 Encounter for adequacy testing for peritoneal dialysis: Secondary | ICD-10-CM | POA: Diagnosis not present

## 2024-07-27 DIAGNOSIS — D509 Iron deficiency anemia, unspecified: Secondary | ICD-10-CM | POA: Diagnosis not present

## 2024-07-27 DIAGNOSIS — N186 End stage renal disease: Secondary | ICD-10-CM | POA: Diagnosis not present

## 2024-07-28 DIAGNOSIS — K7689 Other specified diseases of liver: Secondary | ICD-10-CM | POA: Diagnosis not present

## 2024-07-28 DIAGNOSIS — N2581 Secondary hyperparathyroidism of renal origin: Secondary | ICD-10-CM | POA: Diagnosis not present

## 2024-07-28 DIAGNOSIS — N186 End stage renal disease: Secondary | ICD-10-CM | POA: Diagnosis not present

## 2024-07-28 DIAGNOSIS — E878 Other disorders of electrolyte and fluid balance, not elsewhere classified: Secondary | ICD-10-CM | POA: Diagnosis not present

## 2024-07-28 DIAGNOSIS — E440 Moderate protein-calorie malnutrition: Secondary | ICD-10-CM | POA: Diagnosis not present

## 2024-07-28 DIAGNOSIS — Z79899 Other long term (current) drug therapy: Secondary | ICD-10-CM | POA: Diagnosis not present

## 2024-07-28 DIAGNOSIS — D509 Iron deficiency anemia, unspecified: Secondary | ICD-10-CM | POA: Diagnosis not present

## 2024-07-28 DIAGNOSIS — Z992 Dependence on renal dialysis: Secondary | ICD-10-CM | POA: Diagnosis not present

## 2024-07-28 DIAGNOSIS — Z4932 Encounter for adequacy testing for peritoneal dialysis: Secondary | ICD-10-CM | POA: Diagnosis not present

## 2024-07-28 DIAGNOSIS — D630 Anemia in neoplastic disease: Secondary | ICD-10-CM | POA: Diagnosis not present

## 2024-07-29 ENCOUNTER — Ambulatory Visit

## 2024-07-29 ENCOUNTER — Ambulatory Visit: Attending: Cardiology | Admitting: Cardiology

## 2024-07-29 ENCOUNTER — Encounter: Payer: Self-pay | Admitting: Cardiology

## 2024-07-29 VITALS — BP 122/74 | HR 69 | Ht 67.0 in | Wt 168.0 lb

## 2024-07-29 DIAGNOSIS — I4891 Unspecified atrial fibrillation: Secondary | ICD-10-CM

## 2024-07-29 DIAGNOSIS — Z992 Dependence on renal dialysis: Secondary | ICD-10-CM | POA: Diagnosis not present

## 2024-07-29 DIAGNOSIS — E440 Moderate protein-calorie malnutrition: Secondary | ICD-10-CM | POA: Diagnosis not present

## 2024-07-29 DIAGNOSIS — N2581 Secondary hyperparathyroidism of renal origin: Secondary | ICD-10-CM | POA: Diagnosis not present

## 2024-07-29 DIAGNOSIS — I5032 Chronic diastolic (congestive) heart failure: Secondary | ICD-10-CM

## 2024-07-29 DIAGNOSIS — D630 Anemia in neoplastic disease: Secondary | ICD-10-CM | POA: Diagnosis not present

## 2024-07-29 DIAGNOSIS — D509 Iron deficiency anemia, unspecified: Secondary | ICD-10-CM | POA: Diagnosis not present

## 2024-07-29 DIAGNOSIS — Z79899 Other long term (current) drug therapy: Secondary | ICD-10-CM | POA: Diagnosis not present

## 2024-07-29 DIAGNOSIS — K7689 Other specified diseases of liver: Secondary | ICD-10-CM | POA: Diagnosis not present

## 2024-07-29 DIAGNOSIS — N186 End stage renal disease: Secondary | ICD-10-CM | POA: Diagnosis not present

## 2024-07-29 DIAGNOSIS — E878 Other disorders of electrolyte and fluid balance, not elsewhere classified: Secondary | ICD-10-CM | POA: Diagnosis not present

## 2024-07-29 DIAGNOSIS — Z4932 Encounter for adequacy testing for peritoneal dialysis: Secondary | ICD-10-CM | POA: Diagnosis not present

## 2024-07-29 NOTE — Progress Notes (Unsigned)
 Enrolled for [employer] to mail a ZIO XT long term holter monitor to the patients address on file.

## 2024-07-29 NOTE — Progress Notes (Signed)
 Cardiology Office Note:  .   Date:  07/29/2024  ID:  Russell Bowers, DOB 10/22/1951, MRN [id number] PCP: Dolan Mateo Larger, MD  [facility] Providers Cardiologist:  Oneil Parchment, MD    History of Present Illness: .   Russell Bowers is a 73 y.o. male Discussed the use of AI scribe software for clinical note transcription with the patient, who gave verbal consent to proceed.  History of Present Illness Russell Bowers is a 73 year old male with bicuspid aortic valve and atrial fibrillation who presents for follow-up regarding his heart condition and medication management.  He has a history of a bicuspid aortic valve with severe calcification and mild to moderate aortic valve stenosis. An echocardiogram in 2024 showed an ejection fraction of 65%. He feels 'pretty good' and is currently on peritoneal dialysis at home, which he finds manageable. His medications include low-dose Eliquis , metoprolol  25 mg twice daily, pravastatin  10 mg, and furosemide  40 mg daily. He takes furosemide  every day, contrary to previous instructions to take it only on non-dialysis days.  He has a history of atrial fibrillation, diagnosed during a hospitalization in January 2024 when he was very ill with COVID-19 and required ICU care. He has been on Eliquis  since then. He experiences frequent bleeding with minor injuries, which is a concern for him and his family. He has not experienced any symptoms suggestive of recurrence of atrial fibrillation.  His family history is significant for atrial fibrillation, as both his mother and brother have had it.  He is interested in increasing his physical activity but is cautious due to his heart condition. He feels 'a little winded' with exertion but is open to walking as a form of exercise.    Studies Reviewed: SABRA   EKG Interpretation Date/Time:  Thursday July 29 2024 14:34:08 EDT Ventricular Rate:  69 PR Interval:  224 QRS Duration:  130 QT  Interval:  426 QTC Calculation: 456 R Axis:   -64  Text Interpretation: Sinus rhythm with 1st degree A-V block Right bundle branch block Left anterior fascicular block Bifascicular block When compared with ECG of 18-Jan-2023 18:19, Sinus rhythm has replaced Atrial fibrillation Vent. rate has decreased BY  43 BPM ST no longer depressed in Anterior leads T wave inversion less evident in Inferior leads T wave inversion no longer evident in Anterior leads Confirmed by Parchment Oneil ([id number]) on 07/29/2024 2:56:35 PM    Results Echocardiogram: EF 65%, bicuspid aortic valve, severe calcification, mild to moderate aortic valve stenosis (2024)  EKG: Normal (07/29/2024) Risk Assessment/Calculations:            Physical Exam:   VS:  BP 122/74   Pulse 69   Ht 5' 7 (1.702 m)   Wt 168 lb (76.2 kg)   SpO2 94%   BMI 26.31 kg/m    Wt Readings from Last 3 Encounters:  07/29/24 168 lb (76.2 kg)  06/24/24 169 lb (76.7 kg)  04/28/24 177 lb 6.4 oz (80.5 kg)    GEN: Well nourished, well developed in no acute distress NECK: No JVD; No carotid bruits CARDIAC: RRR, no murmurs, no rubs, no gallops RESPIRATORY:  Clear to auscultation without rales, wheezing or rhonchi  ABDOMEN: Soft, non-tender, non-distended EXTREMITIES:  No edema; No deformity   ASSESSMENT AND PLAN: .    Assessment and Plan Assessment & Plan Bicuspid aortic valve with severe calcification and mild to moderate aortic stenosis Bicuspid aortic valve with severe calcification and mild to moderate aortic  stenosis identified on echocardiogram in 2024. The valve demonstrates reduced opening capacity. Recent EKG shows no significant changes. - Continue periodic monitoring with echocardiograms to assess valve function.  Paroxysmal atrial fibrillation, remote, under surveillance Paroxysmal atrial fibrillation identified during hospitalization in 2024, likely triggered by severe illness (COVID-19). No recent evidence of atrial fibrillation.  Currently on Apixaban  (Eliquis ) for stroke prevention. Discussed potential bleeding risks associated with Eliquis . Consideration of discontinuing Eliquis  if no atrial fibrillation is detected on monitoring. Discussed the Watchman device as an alternative to Eliquis  for stroke prevention, noting it is as effective as Eliquis  but involves procedural risks, especially in patients with a history of kidney transplant and peritoneal dialysis. - Place a heart monitor for up to two weeks to detect any atrial fibrillation. - If no atrial fibrillation is detected, discontinue Apixaban  (Eliquis ).  Discussed risks and benefits with him and his wife. - Discuss potential referral to electrophysiologist for Watchman device if atrial fibrillation recurs and bleeding risk persists.         Dispo: 1 yr  Signed, Oneil Parchment, MD

## 2024-07-29 NOTE — Patient Instructions (Signed)
 Medication Instructions:  The current medical regimen is effective;  continue present plan and medications.  *If you need a refill on your cardiac medications before your next appointment, please call your pharmacy*   Testing/Procedures: ZIO XT- Long Term Monitor Instructions  Your physician has requested you wear a ZIO patch monitor for 14 days.  This is a single patch monitor. [employer] supplies one patch monitor per enrollment. Additional stickers are not available. Please do not apply patch if you will be having a Nuclear Stress Test,  Echocardiogram, Cardiac CT, MRI, or Chest Xray during the period you would be wearing the  monitor. The patch cannot be worn during these tests. You cannot remove and re-apply the  ZIO XT patch monitor.  Your ZIO patch monitor will be mailed 3 day USPS to your address on file. It may take 3-5 days  to receive your monitor after you have been enrolled.  Once you have received your monitor, please review the enclosed instructions. Your monitor  has already been registered assigning a specific monitor serial # to you.  Billing and Patient Assistance Program Information  We have supplied [employer] with any of your insurance information on file for billing purposes. [employer] offers a sliding scale Patient Assistance Program for patients that do not have  insurance, or whose insurance does not completely cover the cost of the ZIO monitor.  You must apply for the Patient Assistance Program to qualify for this discounted rate.  To apply, please call [employer] at [phone], select option 4, select option 2, ask to apply for  Patient Assistance Program. Meredeth will ask your household income, and how many people  are in your household. They will quote your out-of-pocket cost based on that information.  [employer] will also be able to set up a 13-month, interest-free payment plan if needed.  Applying the monitor   Shave hair from upper left chest.  Hold abrader  disc by orange tab. Rub abrader in 40 strokes over the upper left chest as  indicated in your monitor instructions.  Clean area with 4 enclosed alcohol pads. Let dry.  Apply patch as indicated in monitor instructions. Patch will be placed under collarbone on left  side of chest with arrow pointing upward.  Rub patch adhesive wings for 2 minutes. Remove white label marked 1. Remove the white  label marked 2. Rub patch adhesive wings for 2 additional minutes.  While looking in a mirror, press and release button in center of patch. A small green light will  flash 3-4 times. This will be your only indicator that the monitor has been turned on.  Do not shower for the first 24 hours. You may shower after the first 24 hours.  Press the button if you feel a symptom. You will hear a small click. Record Date, Time and  Symptom in the Patient Logbook.  When you are ready to remove the patch, follow instructions on the last 2 pages of Patient  Logbook. Stick patch monitor onto the last page of Patient Logbook.  Place Patient Logbook in the blue and white box. Use locking tab on box and tape box closed  securely. The blue and white box has prepaid postage on it. Please place it in the mailbox as  soon as possible. Your physician should have your test results approximately 7 days after the  monitor has been mailed back to [facility].  Call [facility] Customer Care at [phone] if you have questions regarding  your ZIO XT  patch monitor. Call them immediately if you see an orange light blinking on your  monitor.  If your monitor falls off in less than 4 days, contact our Monitor department at [phone].  If your monitor becomes loose or falls off after 4 days call [employer] at [phone] for  suggestions on securing your monitor   Follow-Up: At [facility], you and your health needs are our priority.  As part of our continuing mission to provide you with exceptional heart  care, our providers are all part of one team.  This team includes your primary Cardiologist (physician) and Advanced Practice Providers or APPs (Physician Assistants and Nurse Practitioners) who all work together to provide you with the care you need, when you need it.  Your next appointment:   6 month(s)  Provider:   One of our Advanced Practice Providers (APPs): Morse Clause, PA-C  Lamarr Satterfield, NP Miriam Shams, NP  Olivia Pavy, PA-C Josefa Beauvais, NP  Leontine Salen, PA-C Orren Fabry, PA-C  [place], PA-C Ernest Dick, NP  Damien Braver, NP Jon Hails, PA-C  Waddell Donath, PA-C    Dayna Dunn, PA-C  Scott Weaver, PA-C Lum Louis, NP Katlyn West, NP Callie Goodrich, PA-C  Evan Williams, PA-C Sheng Haley, PA-C  Xika Zhao, NP Kathleen Johnson, PA-C       We recommend signing up for the patient portal called MyChart.  Sign up information is provided on this After Visit Summary.  MyChart is used to connect with patients for Virtual Visits (Telemedicine).  Patients are able to view lab/test results, encounter notes, upcoming appointments, etc.  Non-urgent messages can be sent to your provider as well.   To learn more about what you can do with MyChart, go to [email or web address].

## 2024-07-30 DIAGNOSIS — Z4932 Encounter for adequacy testing for peritoneal dialysis: Secondary | ICD-10-CM | POA: Diagnosis not present

## 2024-07-30 DIAGNOSIS — K7689 Other specified diseases of liver: Secondary | ICD-10-CM | POA: Diagnosis not present

## 2024-07-30 DIAGNOSIS — D630 Anemia in neoplastic disease: Secondary | ICD-10-CM | POA: Diagnosis not present

## 2024-07-30 DIAGNOSIS — E878 Other disorders of electrolyte and fluid balance, not elsewhere classified: Secondary | ICD-10-CM | POA: Diagnosis not present

## 2024-07-30 DIAGNOSIS — D509 Iron deficiency anemia, unspecified: Secondary | ICD-10-CM | POA: Diagnosis not present

## 2024-07-30 DIAGNOSIS — N2581 Secondary hyperparathyroidism of renal origin: Secondary | ICD-10-CM | POA: Diagnosis not present

## 2024-07-30 DIAGNOSIS — Z79899 Other long term (current) drug therapy: Secondary | ICD-10-CM | POA: Diagnosis not present

## 2024-07-30 DIAGNOSIS — N186 End stage renal disease: Secondary | ICD-10-CM | POA: Diagnosis not present

## 2024-07-30 DIAGNOSIS — Z992 Dependence on renal dialysis: Secondary | ICD-10-CM | POA: Diagnosis not present

## 2024-07-30 DIAGNOSIS — E440 Moderate protein-calorie malnutrition: Secondary | ICD-10-CM | POA: Diagnosis not present

## 2024-07-31 DIAGNOSIS — N2581 Secondary hyperparathyroidism of renal origin: Secondary | ICD-10-CM | POA: Diagnosis not present

## 2024-07-31 DIAGNOSIS — E878 Other disorders of electrolyte and fluid balance, not elsewhere classified: Secondary | ICD-10-CM | POA: Diagnosis not present

## 2024-07-31 DIAGNOSIS — D630 Anemia in neoplastic disease: Secondary | ICD-10-CM | POA: Diagnosis not present

## 2024-07-31 DIAGNOSIS — E440 Moderate protein-calorie malnutrition: Secondary | ICD-10-CM | POA: Diagnosis not present

## 2024-07-31 DIAGNOSIS — Z4932 Encounter for adequacy testing for peritoneal dialysis: Secondary | ICD-10-CM | POA: Diagnosis not present

## 2024-07-31 DIAGNOSIS — D509 Iron deficiency anemia, unspecified: Secondary | ICD-10-CM | POA: Diagnosis not present

## 2024-07-31 DIAGNOSIS — N186 End stage renal disease: Secondary | ICD-10-CM | POA: Diagnosis not present

## 2024-07-31 DIAGNOSIS — Z79899 Other long term (current) drug therapy: Secondary | ICD-10-CM | POA: Diagnosis not present

## 2024-07-31 DIAGNOSIS — K7689 Other specified diseases of liver: Secondary | ICD-10-CM | POA: Diagnosis not present

## 2024-07-31 DIAGNOSIS — Z992 Dependence on renal dialysis: Secondary | ICD-10-CM | POA: Diagnosis not present

## 2024-08-01 DIAGNOSIS — E440 Moderate protein-calorie malnutrition: Secondary | ICD-10-CM | POA: Diagnosis not present

## 2024-08-01 DIAGNOSIS — Z4932 Encounter for adequacy testing for peritoneal dialysis: Secondary | ICD-10-CM | POA: Diagnosis not present

## 2024-08-01 DIAGNOSIS — E878 Other disorders of electrolyte and fluid balance, not elsewhere classified: Secondary | ICD-10-CM | POA: Diagnosis not present

## 2024-08-01 DIAGNOSIS — N186 End stage renal disease: Secondary | ICD-10-CM | POA: Diagnosis not present

## 2024-08-01 DIAGNOSIS — D509 Iron deficiency anemia, unspecified: Secondary | ICD-10-CM | POA: Diagnosis not present

## 2024-08-01 DIAGNOSIS — D630 Anemia in neoplastic disease: Secondary | ICD-10-CM | POA: Diagnosis not present

## 2024-08-01 DIAGNOSIS — N2581 Secondary hyperparathyroidism of renal origin: Secondary | ICD-10-CM | POA: Diagnosis not present

## 2024-08-01 DIAGNOSIS — K7689 Other specified diseases of liver: Secondary | ICD-10-CM | POA: Diagnosis not present

## 2024-08-01 DIAGNOSIS — Z79899 Other long term (current) drug therapy: Secondary | ICD-10-CM | POA: Diagnosis not present

## 2024-08-01 DIAGNOSIS — Z992 Dependence on renal dialysis: Secondary | ICD-10-CM | POA: Diagnosis not present

## 2024-08-02 DIAGNOSIS — Z992 Dependence on renal dialysis: Secondary | ICD-10-CM | POA: Diagnosis not present

## 2024-08-02 DIAGNOSIS — Z4932 Encounter for adequacy testing for peritoneal dialysis: Secondary | ICD-10-CM | POA: Diagnosis not present

## 2024-08-02 DIAGNOSIS — Z79899 Other long term (current) drug therapy: Secondary | ICD-10-CM | POA: Diagnosis not present

## 2024-08-02 DIAGNOSIS — E440 Moderate protein-calorie malnutrition: Secondary | ICD-10-CM | POA: Diagnosis not present

## 2024-08-02 DIAGNOSIS — N186 End stage renal disease: Secondary | ICD-10-CM | POA: Diagnosis not present

## 2024-08-02 DIAGNOSIS — D509 Iron deficiency anemia, unspecified: Secondary | ICD-10-CM | POA: Diagnosis not present

## 2024-08-02 DIAGNOSIS — E878 Other disorders of electrolyte and fluid balance, not elsewhere classified: Secondary | ICD-10-CM | POA: Diagnosis not present

## 2024-08-02 DIAGNOSIS — D630 Anemia in neoplastic disease: Secondary | ICD-10-CM | POA: Diagnosis not present

## 2024-08-02 DIAGNOSIS — K7689 Other specified diseases of liver: Secondary | ICD-10-CM | POA: Diagnosis not present

## 2024-08-02 DIAGNOSIS — N2581 Secondary hyperparathyroidism of renal origin: Secondary | ICD-10-CM | POA: Diagnosis not present

## 2024-08-03 DIAGNOSIS — N2581 Secondary hyperparathyroidism of renal origin: Secondary | ICD-10-CM | POA: Diagnosis not present

## 2024-08-03 DIAGNOSIS — D509 Iron deficiency anemia, unspecified: Secondary | ICD-10-CM | POA: Diagnosis not present

## 2024-08-03 DIAGNOSIS — N186 End stage renal disease: Secondary | ICD-10-CM | POA: Diagnosis not present

## 2024-08-03 DIAGNOSIS — E878 Other disorders of electrolyte and fluid balance, not elsewhere classified: Secondary | ICD-10-CM | POA: Diagnosis not present

## 2024-08-03 DIAGNOSIS — K7689 Other specified diseases of liver: Secondary | ICD-10-CM | POA: Diagnosis not present

## 2024-08-03 DIAGNOSIS — Z992 Dependence on renal dialysis: Secondary | ICD-10-CM | POA: Diagnosis not present

## 2024-08-03 DIAGNOSIS — Z79899 Other long term (current) drug therapy: Secondary | ICD-10-CM | POA: Diagnosis not present

## 2024-08-03 DIAGNOSIS — E440 Moderate protein-calorie malnutrition: Secondary | ICD-10-CM | POA: Diagnosis not present

## 2024-08-03 DIAGNOSIS — D630 Anemia in neoplastic disease: Secondary | ICD-10-CM | POA: Diagnosis not present

## 2024-08-03 DIAGNOSIS — Z4932 Encounter for adequacy testing for peritoneal dialysis: Secondary | ICD-10-CM | POA: Diagnosis not present

## 2024-08-04 DIAGNOSIS — D509 Iron deficiency anemia, unspecified: Secondary | ICD-10-CM | POA: Diagnosis not present

## 2024-08-04 DIAGNOSIS — Z4932 Encounter for adequacy testing for peritoneal dialysis: Secondary | ICD-10-CM | POA: Diagnosis not present

## 2024-08-04 DIAGNOSIS — E440 Moderate protein-calorie malnutrition: Secondary | ICD-10-CM | POA: Diagnosis not present

## 2024-08-04 DIAGNOSIS — E878 Other disorders of electrolyte and fluid balance, not elsewhere classified: Secondary | ICD-10-CM | POA: Diagnosis not present

## 2024-08-04 DIAGNOSIS — D630 Anemia in neoplastic disease: Secondary | ICD-10-CM | POA: Diagnosis not present

## 2024-08-04 DIAGNOSIS — K7689 Other specified diseases of liver: Secondary | ICD-10-CM | POA: Diagnosis not present

## 2024-08-04 DIAGNOSIS — N186 End stage renal disease: Secondary | ICD-10-CM | POA: Diagnosis not present

## 2024-08-04 DIAGNOSIS — Z79899 Other long term (current) drug therapy: Secondary | ICD-10-CM | POA: Diagnosis not present

## 2024-08-04 DIAGNOSIS — Z992 Dependence on renal dialysis: Secondary | ICD-10-CM | POA: Diagnosis not present

## 2024-08-04 DIAGNOSIS — N2581 Secondary hyperparathyroidism of renal origin: Secondary | ICD-10-CM | POA: Diagnosis not present

## 2024-08-05 DIAGNOSIS — E878 Other disorders of electrolyte and fluid balance, not elsewhere classified: Secondary | ICD-10-CM | POA: Diagnosis not present

## 2024-08-05 DIAGNOSIS — K7689 Other specified diseases of liver: Secondary | ICD-10-CM | POA: Diagnosis not present

## 2024-08-05 DIAGNOSIS — Z79899 Other long term (current) drug therapy: Secondary | ICD-10-CM | POA: Diagnosis not present

## 2024-08-05 DIAGNOSIS — N2581 Secondary hyperparathyroidism of renal origin: Secondary | ICD-10-CM | POA: Diagnosis not present

## 2024-08-05 DIAGNOSIS — Z4932 Encounter for adequacy testing for peritoneal dialysis: Secondary | ICD-10-CM | POA: Diagnosis not present

## 2024-08-05 DIAGNOSIS — D630 Anemia in neoplastic disease: Secondary | ICD-10-CM | POA: Diagnosis not present

## 2024-08-05 DIAGNOSIS — D509 Iron deficiency anemia, unspecified: Secondary | ICD-10-CM | POA: Diagnosis not present

## 2024-08-05 DIAGNOSIS — Z992 Dependence on renal dialysis: Secondary | ICD-10-CM | POA: Diagnosis not present

## 2024-08-05 DIAGNOSIS — E440 Moderate protein-calorie malnutrition: Secondary | ICD-10-CM | POA: Diagnosis not present

## 2024-08-05 DIAGNOSIS — N186 End stage renal disease: Secondary | ICD-10-CM | POA: Diagnosis not present

## 2024-08-06 DIAGNOSIS — D509 Iron deficiency anemia, unspecified: Secondary | ICD-10-CM | POA: Diagnosis not present

## 2024-08-06 DIAGNOSIS — E878 Other disorders of electrolyte and fluid balance, not elsewhere classified: Secondary | ICD-10-CM | POA: Diagnosis not present

## 2024-08-06 DIAGNOSIS — N2581 Secondary hyperparathyroidism of renal origin: Secondary | ICD-10-CM | POA: Diagnosis not present

## 2024-08-06 DIAGNOSIS — E440 Moderate protein-calorie malnutrition: Secondary | ICD-10-CM | POA: Diagnosis not present

## 2024-08-06 DIAGNOSIS — D630 Anemia in neoplastic disease: Secondary | ICD-10-CM | POA: Diagnosis not present

## 2024-08-06 DIAGNOSIS — N186 End stage renal disease: Secondary | ICD-10-CM | POA: Diagnosis not present

## 2024-08-06 DIAGNOSIS — K7689 Other specified diseases of liver: Secondary | ICD-10-CM | POA: Diagnosis not present

## 2024-08-06 DIAGNOSIS — Z992 Dependence on renal dialysis: Secondary | ICD-10-CM | POA: Diagnosis not present

## 2024-08-06 DIAGNOSIS — Z79899 Other long term (current) drug therapy: Secondary | ICD-10-CM | POA: Diagnosis not present

## 2024-08-06 DIAGNOSIS — Z4932 Encounter for adequacy testing for peritoneal dialysis: Secondary | ICD-10-CM | POA: Diagnosis not present

## 2024-08-07 DIAGNOSIS — K7689 Other specified diseases of liver: Secondary | ICD-10-CM | POA: Diagnosis not present

## 2024-08-07 DIAGNOSIS — D630 Anemia in neoplastic disease: Secondary | ICD-10-CM | POA: Diagnosis not present

## 2024-08-07 DIAGNOSIS — E878 Other disorders of electrolyte and fluid balance, not elsewhere classified: Secondary | ICD-10-CM | POA: Diagnosis not present

## 2024-08-07 DIAGNOSIS — Z4932 Encounter for adequacy testing for peritoneal dialysis: Secondary | ICD-10-CM | POA: Diagnosis not present

## 2024-08-07 DIAGNOSIS — Z79899 Other long term (current) drug therapy: Secondary | ICD-10-CM | POA: Diagnosis not present

## 2024-08-07 DIAGNOSIS — D509 Iron deficiency anemia, unspecified: Secondary | ICD-10-CM | POA: Diagnosis not present

## 2024-08-07 DIAGNOSIS — N2581 Secondary hyperparathyroidism of renal origin: Secondary | ICD-10-CM | POA: Diagnosis not present

## 2024-08-07 DIAGNOSIS — E440 Moderate protein-calorie malnutrition: Secondary | ICD-10-CM | POA: Diagnosis not present

## 2024-08-07 DIAGNOSIS — N186 End stage renal disease: Secondary | ICD-10-CM | POA: Diagnosis not present

## 2024-08-07 DIAGNOSIS — Z992 Dependence on renal dialysis: Secondary | ICD-10-CM | POA: Diagnosis not present

## 2024-08-08 DIAGNOSIS — K7689 Other specified diseases of liver: Secondary | ICD-10-CM | POA: Diagnosis not present

## 2024-08-08 DIAGNOSIS — E440 Moderate protein-calorie malnutrition: Secondary | ICD-10-CM | POA: Diagnosis not present

## 2024-08-08 DIAGNOSIS — N186 End stage renal disease: Secondary | ICD-10-CM | POA: Diagnosis not present

## 2024-08-08 DIAGNOSIS — D509 Iron deficiency anemia, unspecified: Secondary | ICD-10-CM | POA: Diagnosis not present

## 2024-08-08 DIAGNOSIS — N2581 Secondary hyperparathyroidism of renal origin: Secondary | ICD-10-CM | POA: Diagnosis not present

## 2024-08-08 DIAGNOSIS — E878 Other disorders of electrolyte and fluid balance, not elsewhere classified: Secondary | ICD-10-CM | POA: Diagnosis not present

## 2024-08-08 DIAGNOSIS — Z79899 Other long term (current) drug therapy: Secondary | ICD-10-CM | POA: Diagnosis not present

## 2024-08-08 DIAGNOSIS — Z4932 Encounter for adequacy testing for peritoneal dialysis: Secondary | ICD-10-CM | POA: Diagnosis not present

## 2024-08-08 DIAGNOSIS — Z992 Dependence on renal dialysis: Secondary | ICD-10-CM | POA: Diagnosis not present

## 2024-08-08 DIAGNOSIS — D630 Anemia in neoplastic disease: Secondary | ICD-10-CM | POA: Diagnosis not present

## 2024-08-09 DIAGNOSIS — E878 Other disorders of electrolyte and fluid balance, not elsewhere classified: Secondary | ICD-10-CM | POA: Diagnosis not present

## 2024-08-09 DIAGNOSIS — Z992 Dependence on renal dialysis: Secondary | ICD-10-CM | POA: Diagnosis not present

## 2024-08-09 DIAGNOSIS — K7689 Other specified diseases of liver: Secondary | ICD-10-CM | POA: Diagnosis not present

## 2024-08-09 DIAGNOSIS — Z4932 Encounter for adequacy testing for peritoneal dialysis: Secondary | ICD-10-CM | POA: Diagnosis not present

## 2024-08-09 DIAGNOSIS — N2581 Secondary hyperparathyroidism of renal origin: Secondary | ICD-10-CM | POA: Diagnosis not present

## 2024-08-09 DIAGNOSIS — D630 Anemia in neoplastic disease: Secondary | ICD-10-CM | POA: Diagnosis not present

## 2024-08-09 DIAGNOSIS — Z79899 Other long term (current) drug therapy: Secondary | ICD-10-CM | POA: Diagnosis not present

## 2024-08-09 DIAGNOSIS — E440 Moderate protein-calorie malnutrition: Secondary | ICD-10-CM | POA: Diagnosis not present

## 2024-08-09 DIAGNOSIS — N186 End stage renal disease: Secondary | ICD-10-CM | POA: Diagnosis not present

## 2024-08-09 DIAGNOSIS — D509 Iron deficiency anemia, unspecified: Secondary | ICD-10-CM | POA: Diagnosis not present

## 2024-08-10 DIAGNOSIS — D630 Anemia in neoplastic disease: Secondary | ICD-10-CM | POA: Diagnosis not present

## 2024-08-10 DIAGNOSIS — Z4932 Encounter for adequacy testing for peritoneal dialysis: Secondary | ICD-10-CM | POA: Diagnosis not present

## 2024-08-10 DIAGNOSIS — E878 Other disorders of electrolyte and fluid balance, not elsewhere classified: Secondary | ICD-10-CM | POA: Diagnosis not present

## 2024-08-10 DIAGNOSIS — K7689 Other specified diseases of liver: Secondary | ICD-10-CM | POA: Diagnosis not present

## 2024-08-10 DIAGNOSIS — N186 End stage renal disease: Secondary | ICD-10-CM | POA: Diagnosis not present

## 2024-08-10 DIAGNOSIS — Z79899 Other long term (current) drug therapy: Secondary | ICD-10-CM | POA: Diagnosis not present

## 2024-08-10 DIAGNOSIS — D509 Iron deficiency anemia, unspecified: Secondary | ICD-10-CM | POA: Diagnosis not present

## 2024-08-10 DIAGNOSIS — E440 Moderate protein-calorie malnutrition: Secondary | ICD-10-CM | POA: Diagnosis not present

## 2024-08-10 DIAGNOSIS — Z992 Dependence on renal dialysis: Secondary | ICD-10-CM | POA: Diagnosis not present

## 2024-08-10 DIAGNOSIS — N2581 Secondary hyperparathyroidism of renal origin: Secondary | ICD-10-CM | POA: Diagnosis not present

## 2024-08-11 DIAGNOSIS — D509 Iron deficiency anemia, unspecified: Secondary | ICD-10-CM | POA: Diagnosis not present

## 2024-08-11 DIAGNOSIS — Z79899 Other long term (current) drug therapy: Secondary | ICD-10-CM | POA: Diagnosis not present

## 2024-08-11 DIAGNOSIS — K7689 Other specified diseases of liver: Secondary | ICD-10-CM | POA: Diagnosis not present

## 2024-08-11 DIAGNOSIS — E878 Other disorders of electrolyte and fluid balance, not elsewhere classified: Secondary | ICD-10-CM | POA: Diagnosis not present

## 2024-08-11 DIAGNOSIS — Z992 Dependence on renal dialysis: Secondary | ICD-10-CM | POA: Diagnosis not present

## 2024-08-11 DIAGNOSIS — D630 Anemia in neoplastic disease: Secondary | ICD-10-CM | POA: Diagnosis not present

## 2024-08-11 DIAGNOSIS — Z4932 Encounter for adequacy testing for peritoneal dialysis: Secondary | ICD-10-CM | POA: Diagnosis not present

## 2024-08-11 DIAGNOSIS — E440 Moderate protein-calorie malnutrition: Secondary | ICD-10-CM | POA: Diagnosis not present

## 2024-08-11 DIAGNOSIS — N2581 Secondary hyperparathyroidism of renal origin: Secondary | ICD-10-CM | POA: Diagnosis not present

## 2024-08-11 DIAGNOSIS — N186 End stage renal disease: Secondary | ICD-10-CM | POA: Diagnosis not present

## 2024-08-12 DIAGNOSIS — Z79899 Other long term (current) drug therapy: Secondary | ICD-10-CM | POA: Diagnosis not present

## 2024-08-12 DIAGNOSIS — Z4932 Encounter for adequacy testing for peritoneal dialysis: Secondary | ICD-10-CM | POA: Diagnosis not present

## 2024-08-12 DIAGNOSIS — D509 Iron deficiency anemia, unspecified: Secondary | ICD-10-CM | POA: Diagnosis not present

## 2024-08-12 DIAGNOSIS — Z992 Dependence on renal dialysis: Secondary | ICD-10-CM | POA: Diagnosis not present

## 2024-08-12 DIAGNOSIS — N186 End stage renal disease: Secondary | ICD-10-CM | POA: Diagnosis not present

## 2024-08-12 DIAGNOSIS — N2581 Secondary hyperparathyroidism of renal origin: Secondary | ICD-10-CM | POA: Diagnosis not present

## 2024-08-12 DIAGNOSIS — D630 Anemia in neoplastic disease: Secondary | ICD-10-CM | POA: Diagnosis not present

## 2024-08-12 DIAGNOSIS — E878 Other disorders of electrolyte and fluid balance, not elsewhere classified: Secondary | ICD-10-CM | POA: Diagnosis not present

## 2024-08-12 DIAGNOSIS — E440 Moderate protein-calorie malnutrition: Secondary | ICD-10-CM | POA: Diagnosis not present

## 2024-08-12 DIAGNOSIS — K7689 Other specified diseases of liver: Secondary | ICD-10-CM | POA: Diagnosis not present

## 2024-08-13 DIAGNOSIS — K7689 Other specified diseases of liver: Secondary | ICD-10-CM | POA: Diagnosis not present

## 2024-08-13 DIAGNOSIS — E440 Moderate protein-calorie malnutrition: Secondary | ICD-10-CM | POA: Diagnosis not present

## 2024-08-13 DIAGNOSIS — Z992 Dependence on renal dialysis: Secondary | ICD-10-CM | POA: Diagnosis not present

## 2024-08-13 DIAGNOSIS — Z79899 Other long term (current) drug therapy: Secondary | ICD-10-CM | POA: Diagnosis not present

## 2024-08-13 DIAGNOSIS — Z4932 Encounter for adequacy testing for peritoneal dialysis: Secondary | ICD-10-CM | POA: Diagnosis not present

## 2024-08-13 DIAGNOSIS — N2581 Secondary hyperparathyroidism of renal origin: Secondary | ICD-10-CM | POA: Diagnosis not present

## 2024-08-13 DIAGNOSIS — D630 Anemia in neoplastic disease: Secondary | ICD-10-CM | POA: Diagnosis not present

## 2024-08-13 DIAGNOSIS — N186 End stage renal disease: Secondary | ICD-10-CM | POA: Diagnosis not present

## 2024-08-13 DIAGNOSIS — D509 Iron deficiency anemia, unspecified: Secondary | ICD-10-CM | POA: Diagnosis not present

## 2024-08-13 DIAGNOSIS — E878 Other disorders of electrolyte and fluid balance, not elsewhere classified: Secondary | ICD-10-CM | POA: Diagnosis not present

## 2024-08-14 DIAGNOSIS — E878 Other disorders of electrolyte and fluid balance, not elsewhere classified: Secondary | ICD-10-CM | POA: Diagnosis not present

## 2024-08-14 DIAGNOSIS — N186 End stage renal disease: Secondary | ICD-10-CM | POA: Diagnosis not present

## 2024-08-14 DIAGNOSIS — D509 Iron deficiency anemia, unspecified: Secondary | ICD-10-CM | POA: Diagnosis not present

## 2024-08-14 DIAGNOSIS — K7689 Other specified diseases of liver: Secondary | ICD-10-CM | POA: Diagnosis not present

## 2024-08-14 DIAGNOSIS — Z992 Dependence on renal dialysis: Secondary | ICD-10-CM | POA: Diagnosis not present

## 2024-08-14 DIAGNOSIS — Z4932 Encounter for adequacy testing for peritoneal dialysis: Secondary | ICD-10-CM | POA: Diagnosis not present

## 2024-08-14 DIAGNOSIS — Z79899 Other long term (current) drug therapy: Secondary | ICD-10-CM | POA: Diagnosis not present

## 2024-08-14 DIAGNOSIS — D630 Anemia in neoplastic disease: Secondary | ICD-10-CM | POA: Diagnosis not present

## 2024-08-14 DIAGNOSIS — N2581 Secondary hyperparathyroidism of renal origin: Secondary | ICD-10-CM | POA: Diagnosis not present

## 2024-08-14 DIAGNOSIS — E440 Moderate protein-calorie malnutrition: Secondary | ICD-10-CM | POA: Diagnosis not present

## 2024-08-15 DIAGNOSIS — Z79899 Other long term (current) drug therapy: Secondary | ICD-10-CM | POA: Diagnosis not present

## 2024-08-15 DIAGNOSIS — Z992 Dependence on renal dialysis: Secondary | ICD-10-CM | POA: Diagnosis not present

## 2024-08-15 DIAGNOSIS — E440 Moderate protein-calorie malnutrition: Secondary | ICD-10-CM | POA: Diagnosis not present

## 2024-08-15 DIAGNOSIS — N2581 Secondary hyperparathyroidism of renal origin: Secondary | ICD-10-CM | POA: Diagnosis not present

## 2024-08-15 DIAGNOSIS — K7689 Other specified diseases of liver: Secondary | ICD-10-CM | POA: Diagnosis not present

## 2024-08-15 DIAGNOSIS — N186 End stage renal disease: Secondary | ICD-10-CM | POA: Diagnosis not present

## 2024-08-15 DIAGNOSIS — E878 Other disorders of electrolyte and fluid balance, not elsewhere classified: Secondary | ICD-10-CM | POA: Diagnosis not present

## 2024-08-15 DIAGNOSIS — D630 Anemia in neoplastic disease: Secondary | ICD-10-CM | POA: Diagnosis not present

## 2024-08-15 DIAGNOSIS — Z4932 Encounter for adequacy testing for peritoneal dialysis: Secondary | ICD-10-CM | POA: Diagnosis not present

## 2024-08-15 DIAGNOSIS — D509 Iron deficiency anemia, unspecified: Secondary | ICD-10-CM | POA: Diagnosis not present

## 2024-08-16 DIAGNOSIS — D630 Anemia in neoplastic disease: Secondary | ICD-10-CM | POA: Diagnosis not present

## 2024-08-16 DIAGNOSIS — D509 Iron deficiency anemia, unspecified: Secondary | ICD-10-CM | POA: Diagnosis not present

## 2024-08-16 DIAGNOSIS — E440 Moderate protein-calorie malnutrition: Secondary | ICD-10-CM | POA: Diagnosis not present

## 2024-08-16 DIAGNOSIS — N186 End stage renal disease: Secondary | ICD-10-CM | POA: Diagnosis not present

## 2024-08-16 DIAGNOSIS — Z79899 Other long term (current) drug therapy: Secondary | ICD-10-CM | POA: Diagnosis not present

## 2024-08-16 DIAGNOSIS — N2581 Secondary hyperparathyroidism of renal origin: Secondary | ICD-10-CM | POA: Diagnosis not present

## 2024-08-16 DIAGNOSIS — E878 Other disorders of electrolyte and fluid balance, not elsewhere classified: Secondary | ICD-10-CM | POA: Diagnosis not present

## 2024-08-16 DIAGNOSIS — K7689 Other specified diseases of liver: Secondary | ICD-10-CM | POA: Diagnosis not present

## 2024-08-16 DIAGNOSIS — Z992 Dependence on renal dialysis: Secondary | ICD-10-CM | POA: Diagnosis not present

## 2024-08-16 DIAGNOSIS — Z4932 Encounter for adequacy testing for peritoneal dialysis: Secondary | ICD-10-CM | POA: Diagnosis not present

## 2024-08-17 DIAGNOSIS — Z79899 Other long term (current) drug therapy: Secondary | ICD-10-CM | POA: Diagnosis not present

## 2024-08-17 DIAGNOSIS — E878 Other disorders of electrolyte and fluid balance, not elsewhere classified: Secondary | ICD-10-CM | POA: Diagnosis not present

## 2024-08-17 DIAGNOSIS — N186 End stage renal disease: Secondary | ICD-10-CM | POA: Diagnosis not present

## 2024-08-17 DIAGNOSIS — K7689 Other specified diseases of liver: Secondary | ICD-10-CM | POA: Diagnosis not present

## 2024-08-17 DIAGNOSIS — E440 Moderate protein-calorie malnutrition: Secondary | ICD-10-CM | POA: Diagnosis not present

## 2024-08-17 DIAGNOSIS — D509 Iron deficiency anemia, unspecified: Secondary | ICD-10-CM | POA: Diagnosis not present

## 2024-08-17 DIAGNOSIS — D630 Anemia in neoplastic disease: Secondary | ICD-10-CM | POA: Diagnosis not present

## 2024-08-17 DIAGNOSIS — N2581 Secondary hyperparathyroidism of renal origin: Secondary | ICD-10-CM | POA: Diagnosis not present

## 2024-08-17 DIAGNOSIS — Z992 Dependence on renal dialysis: Secondary | ICD-10-CM | POA: Diagnosis not present

## 2024-08-17 DIAGNOSIS — Z4932 Encounter for adequacy testing for peritoneal dialysis: Secondary | ICD-10-CM | POA: Diagnosis not present

## 2024-08-18 DIAGNOSIS — N2581 Secondary hyperparathyroidism of renal origin: Secondary | ICD-10-CM | POA: Diagnosis not present

## 2024-08-18 DIAGNOSIS — Z4932 Encounter for adequacy testing for peritoneal dialysis: Secondary | ICD-10-CM | POA: Diagnosis not present

## 2024-08-18 DIAGNOSIS — N186 End stage renal disease: Secondary | ICD-10-CM | POA: Diagnosis not present

## 2024-08-18 DIAGNOSIS — E440 Moderate protein-calorie malnutrition: Secondary | ICD-10-CM | POA: Diagnosis not present

## 2024-08-18 DIAGNOSIS — Z992 Dependence on renal dialysis: Secondary | ICD-10-CM | POA: Diagnosis not present

## 2024-08-18 DIAGNOSIS — Z79899 Other long term (current) drug therapy: Secondary | ICD-10-CM | POA: Diagnosis not present

## 2024-08-18 DIAGNOSIS — D509 Iron deficiency anemia, unspecified: Secondary | ICD-10-CM | POA: Diagnosis not present

## 2024-08-18 DIAGNOSIS — E878 Other disorders of electrolyte and fluid balance, not elsewhere classified: Secondary | ICD-10-CM | POA: Diagnosis not present

## 2024-08-18 DIAGNOSIS — D630 Anemia in neoplastic disease: Secondary | ICD-10-CM | POA: Diagnosis not present

## 2024-08-18 DIAGNOSIS — K7689 Other specified diseases of liver: Secondary | ICD-10-CM | POA: Diagnosis not present

## 2024-08-19 DIAGNOSIS — N186 End stage renal disease: Secondary | ICD-10-CM | POA: Diagnosis not present

## 2024-08-19 DIAGNOSIS — D509 Iron deficiency anemia, unspecified: Secondary | ICD-10-CM | POA: Diagnosis not present

## 2024-08-19 DIAGNOSIS — E440 Moderate protein-calorie malnutrition: Secondary | ICD-10-CM | POA: Diagnosis not present

## 2024-08-19 DIAGNOSIS — Z4932 Encounter for adequacy testing for peritoneal dialysis: Secondary | ICD-10-CM | POA: Diagnosis not present

## 2024-08-19 DIAGNOSIS — D630 Anemia in neoplastic disease: Secondary | ICD-10-CM | POA: Diagnosis not present

## 2024-08-19 DIAGNOSIS — Z992 Dependence on renal dialysis: Secondary | ICD-10-CM | POA: Diagnosis not present

## 2024-08-19 DIAGNOSIS — Z79899 Other long term (current) drug therapy: Secondary | ICD-10-CM | POA: Diagnosis not present

## 2024-08-19 DIAGNOSIS — E878 Other disorders of electrolyte and fluid balance, not elsewhere classified: Secondary | ICD-10-CM | POA: Diagnosis not present

## 2024-08-19 DIAGNOSIS — K7689 Other specified diseases of liver: Secondary | ICD-10-CM | POA: Diagnosis not present

## 2024-08-19 DIAGNOSIS — N2581 Secondary hyperparathyroidism of renal origin: Secondary | ICD-10-CM | POA: Diagnosis not present

## 2024-08-20 DIAGNOSIS — Z4932 Encounter for adequacy testing for peritoneal dialysis: Secondary | ICD-10-CM | POA: Diagnosis not present

## 2024-08-20 DIAGNOSIS — E878 Other disorders of electrolyte and fluid balance, not elsewhere classified: Secondary | ICD-10-CM | POA: Diagnosis not present

## 2024-08-20 DIAGNOSIS — N186 End stage renal disease: Secondary | ICD-10-CM | POA: Diagnosis not present

## 2024-08-20 DIAGNOSIS — N2581 Secondary hyperparathyroidism of renal origin: Secondary | ICD-10-CM | POA: Diagnosis not present

## 2024-08-20 DIAGNOSIS — E440 Moderate protein-calorie malnutrition: Secondary | ICD-10-CM | POA: Diagnosis not present

## 2024-08-20 DIAGNOSIS — D509 Iron deficiency anemia, unspecified: Secondary | ICD-10-CM | POA: Diagnosis not present

## 2024-08-20 DIAGNOSIS — K7689 Other specified diseases of liver: Secondary | ICD-10-CM | POA: Diagnosis not present

## 2024-08-20 DIAGNOSIS — Z992 Dependence on renal dialysis: Secondary | ICD-10-CM | POA: Diagnosis not present

## 2024-08-20 DIAGNOSIS — D630 Anemia in neoplastic disease: Secondary | ICD-10-CM | POA: Diagnosis not present

## 2024-08-20 DIAGNOSIS — Z79899 Other long term (current) drug therapy: Secondary | ICD-10-CM | POA: Diagnosis not present

## 2024-08-21 DIAGNOSIS — K7689 Other specified diseases of liver: Secondary | ICD-10-CM | POA: Diagnosis not present

## 2024-08-21 DIAGNOSIS — D509 Iron deficiency anemia, unspecified: Secondary | ICD-10-CM | POA: Diagnosis not present

## 2024-08-21 DIAGNOSIS — E878 Other disorders of electrolyte and fluid balance, not elsewhere classified: Secondary | ICD-10-CM | POA: Diagnosis not present

## 2024-08-21 DIAGNOSIS — Z4932 Encounter for adequacy testing for peritoneal dialysis: Secondary | ICD-10-CM | POA: Diagnosis not present

## 2024-08-21 DIAGNOSIS — D630 Anemia in neoplastic disease: Secondary | ICD-10-CM | POA: Diagnosis not present

## 2024-08-21 DIAGNOSIS — Z79899 Other long term (current) drug therapy: Secondary | ICD-10-CM | POA: Diagnosis not present

## 2024-08-21 DIAGNOSIS — E440 Moderate protein-calorie malnutrition: Secondary | ICD-10-CM | POA: Diagnosis not present

## 2024-08-21 DIAGNOSIS — Z992 Dependence on renal dialysis: Secondary | ICD-10-CM | POA: Diagnosis not present

## 2024-08-21 DIAGNOSIS — N2581 Secondary hyperparathyroidism of renal origin: Secondary | ICD-10-CM | POA: Diagnosis not present

## 2024-08-21 DIAGNOSIS — N186 End stage renal disease: Secondary | ICD-10-CM | POA: Diagnosis not present

## 2024-08-22 DIAGNOSIS — N186 End stage renal disease: Secondary | ICD-10-CM | POA: Diagnosis not present

## 2024-08-22 DIAGNOSIS — E1129 Type 2 diabetes mellitus with other diabetic kidney complication: Secondary | ICD-10-CM | POA: Diagnosis not present

## 2024-08-22 DIAGNOSIS — K7689 Other specified diseases of liver: Secondary | ICD-10-CM | POA: Diagnosis not present

## 2024-08-22 DIAGNOSIS — N2581 Secondary hyperparathyroidism of renal origin: Secondary | ICD-10-CM | POA: Diagnosis not present

## 2024-08-22 DIAGNOSIS — E440 Moderate protein-calorie malnutrition: Secondary | ICD-10-CM | POA: Diagnosis not present

## 2024-08-22 DIAGNOSIS — D509 Iron deficiency anemia, unspecified: Secondary | ICD-10-CM | POA: Diagnosis not present

## 2024-08-22 DIAGNOSIS — E878 Other disorders of electrolyte and fluid balance, not elsewhere classified: Secondary | ICD-10-CM | POA: Diagnosis not present

## 2024-08-22 DIAGNOSIS — Z992 Dependence on renal dialysis: Secondary | ICD-10-CM | POA: Diagnosis not present

## 2024-08-22 DIAGNOSIS — Z79899 Other long term (current) drug therapy: Secondary | ICD-10-CM | POA: Diagnosis not present

## 2024-08-22 DIAGNOSIS — D630 Anemia in neoplastic disease: Secondary | ICD-10-CM | POA: Diagnosis not present

## 2024-08-22 DIAGNOSIS — Z4932 Encounter for adequacy testing for peritoneal dialysis: Secondary | ICD-10-CM | POA: Diagnosis not present

## 2024-08-23 DIAGNOSIS — N2581 Secondary hyperparathyroidism of renal origin: Secondary | ICD-10-CM | POA: Diagnosis not present

## 2024-08-23 DIAGNOSIS — N186 End stage renal disease: Secondary | ICD-10-CM | POA: Diagnosis not present

## 2024-08-23 DIAGNOSIS — Z992 Dependence on renal dialysis: Secondary | ICD-10-CM | POA: Diagnosis not present

## 2024-08-23 DIAGNOSIS — D509 Iron deficiency anemia, unspecified: Secondary | ICD-10-CM | POA: Diagnosis not present

## 2024-08-23 DIAGNOSIS — E878 Other disorders of electrolyte and fluid balance, not elsewhere classified: Secondary | ICD-10-CM | POA: Diagnosis not present

## 2024-08-23 DIAGNOSIS — Z4932 Encounter for adequacy testing for peritoneal dialysis: Secondary | ICD-10-CM | POA: Diagnosis not present

## 2024-08-23 DIAGNOSIS — Z79899 Other long term (current) drug therapy: Secondary | ICD-10-CM | POA: Diagnosis not present

## 2024-08-23 DIAGNOSIS — E440 Moderate protein-calorie malnutrition: Secondary | ICD-10-CM | POA: Diagnosis not present

## 2024-08-23 DIAGNOSIS — D630 Anemia in neoplastic disease: Secondary | ICD-10-CM | POA: Diagnosis not present

## 2024-08-23 DIAGNOSIS — K7689 Other specified diseases of liver: Secondary | ICD-10-CM | POA: Diagnosis not present

## 2024-08-24 DIAGNOSIS — D630 Anemia in neoplastic disease: Secondary | ICD-10-CM | POA: Diagnosis not present

## 2024-08-24 DIAGNOSIS — N2581 Secondary hyperparathyroidism of renal origin: Secondary | ICD-10-CM | POA: Diagnosis not present

## 2024-08-24 DIAGNOSIS — Z79899 Other long term (current) drug therapy: Secondary | ICD-10-CM | POA: Diagnosis not present

## 2024-08-24 DIAGNOSIS — Z992 Dependence on renal dialysis: Secondary | ICD-10-CM | POA: Diagnosis not present

## 2024-08-24 DIAGNOSIS — K7689 Other specified diseases of liver: Secondary | ICD-10-CM | POA: Diagnosis not present

## 2024-08-24 DIAGNOSIS — Z4932 Encounter for adequacy testing for peritoneal dialysis: Secondary | ICD-10-CM | POA: Diagnosis not present

## 2024-08-24 DIAGNOSIS — D509 Iron deficiency anemia, unspecified: Secondary | ICD-10-CM | POA: Diagnosis not present

## 2024-08-24 DIAGNOSIS — E878 Other disorders of electrolyte and fluid balance, not elsewhere classified: Secondary | ICD-10-CM | POA: Diagnosis not present

## 2024-08-24 DIAGNOSIS — N186 End stage renal disease: Secondary | ICD-10-CM | POA: Diagnosis not present

## 2024-08-24 DIAGNOSIS — E440 Moderate protein-calorie malnutrition: Secondary | ICD-10-CM | POA: Diagnosis not present

## 2024-08-25 DIAGNOSIS — Z992 Dependence on renal dialysis: Secondary | ICD-10-CM | POA: Diagnosis not present

## 2024-08-25 DIAGNOSIS — N186 End stage renal disease: Secondary | ICD-10-CM | POA: Diagnosis not present

## 2024-08-25 DIAGNOSIS — Z4932 Encounter for adequacy testing for peritoneal dialysis: Secondary | ICD-10-CM | POA: Diagnosis not present

## 2024-08-25 DIAGNOSIS — N2581 Secondary hyperparathyroidism of renal origin: Secondary | ICD-10-CM | POA: Diagnosis not present

## 2024-08-25 DIAGNOSIS — E440 Moderate protein-calorie malnutrition: Secondary | ICD-10-CM | POA: Diagnosis not present

## 2024-08-25 DIAGNOSIS — K7689 Other specified diseases of liver: Secondary | ICD-10-CM | POA: Diagnosis not present

## 2024-08-25 DIAGNOSIS — Z79899 Other long term (current) drug therapy: Secondary | ICD-10-CM | POA: Diagnosis not present

## 2024-08-25 DIAGNOSIS — D630 Anemia in neoplastic disease: Secondary | ICD-10-CM | POA: Diagnosis not present

## 2024-08-25 DIAGNOSIS — E878 Other disorders of electrolyte and fluid balance, not elsewhere classified: Secondary | ICD-10-CM | POA: Diagnosis not present

## 2024-08-25 DIAGNOSIS — D509 Iron deficiency anemia, unspecified: Secondary | ICD-10-CM | POA: Diagnosis not present

## 2024-08-26 DIAGNOSIS — D630 Anemia in neoplastic disease: Secondary | ICD-10-CM | POA: Diagnosis not present

## 2024-08-26 DIAGNOSIS — Z4932 Encounter for adequacy testing for peritoneal dialysis: Secondary | ICD-10-CM | POA: Diagnosis not present

## 2024-08-26 DIAGNOSIS — E878 Other disorders of electrolyte and fluid balance, not elsewhere classified: Secondary | ICD-10-CM | POA: Diagnosis not present

## 2024-08-26 DIAGNOSIS — E440 Moderate protein-calorie malnutrition: Secondary | ICD-10-CM | POA: Diagnosis not present

## 2024-08-26 DIAGNOSIS — N186 End stage renal disease: Secondary | ICD-10-CM | POA: Diagnosis not present

## 2024-08-26 DIAGNOSIS — D509 Iron deficiency anemia, unspecified: Secondary | ICD-10-CM | POA: Diagnosis not present

## 2024-08-26 DIAGNOSIS — Z79899 Other long term (current) drug therapy: Secondary | ICD-10-CM | POA: Diagnosis not present

## 2024-08-26 DIAGNOSIS — K7689 Other specified diseases of liver: Secondary | ICD-10-CM | POA: Diagnosis not present

## 2024-08-26 DIAGNOSIS — I4891 Unspecified atrial fibrillation: Secondary | ICD-10-CM | POA: Diagnosis not present

## 2024-08-26 DIAGNOSIS — N2581 Secondary hyperparathyroidism of renal origin: Secondary | ICD-10-CM | POA: Diagnosis not present

## 2024-08-26 DIAGNOSIS — Z992 Dependence on renal dialysis: Secondary | ICD-10-CM | POA: Diagnosis not present

## 2024-08-27 DIAGNOSIS — Z79899 Other long term (current) drug therapy: Secondary | ICD-10-CM | POA: Diagnosis not present

## 2024-08-27 DIAGNOSIS — E878 Other disorders of electrolyte and fluid balance, not elsewhere classified: Secondary | ICD-10-CM | POA: Diagnosis not present

## 2024-08-27 DIAGNOSIS — N2581 Secondary hyperparathyroidism of renal origin: Secondary | ICD-10-CM | POA: Diagnosis not present

## 2024-08-27 DIAGNOSIS — Z992 Dependence on renal dialysis: Secondary | ICD-10-CM | POA: Diagnosis not present

## 2024-08-27 DIAGNOSIS — D509 Iron deficiency anemia, unspecified: Secondary | ICD-10-CM | POA: Diagnosis not present

## 2024-08-27 DIAGNOSIS — E440 Moderate protein-calorie malnutrition: Secondary | ICD-10-CM | POA: Diagnosis not present

## 2024-08-27 DIAGNOSIS — K7689 Other specified diseases of liver: Secondary | ICD-10-CM | POA: Diagnosis not present

## 2024-08-27 DIAGNOSIS — D630 Anemia in neoplastic disease: Secondary | ICD-10-CM | POA: Diagnosis not present

## 2024-08-27 DIAGNOSIS — N186 End stage renal disease: Secondary | ICD-10-CM | POA: Diagnosis not present

## 2024-08-27 DIAGNOSIS — Z4932 Encounter for adequacy testing for peritoneal dialysis: Secondary | ICD-10-CM | POA: Diagnosis not present

## 2024-08-28 DIAGNOSIS — K7689 Other specified diseases of liver: Secondary | ICD-10-CM | POA: Diagnosis not present

## 2024-08-28 DIAGNOSIS — Z79899 Other long term (current) drug therapy: Secondary | ICD-10-CM | POA: Diagnosis not present

## 2024-08-28 DIAGNOSIS — N2581 Secondary hyperparathyroidism of renal origin: Secondary | ICD-10-CM | POA: Diagnosis not present

## 2024-08-28 DIAGNOSIS — E440 Moderate protein-calorie malnutrition: Secondary | ICD-10-CM | POA: Diagnosis not present

## 2024-08-28 DIAGNOSIS — Z992 Dependence on renal dialysis: Secondary | ICD-10-CM | POA: Diagnosis not present

## 2024-08-28 DIAGNOSIS — D509 Iron deficiency anemia, unspecified: Secondary | ICD-10-CM | POA: Diagnosis not present

## 2024-08-28 DIAGNOSIS — D630 Anemia in neoplastic disease: Secondary | ICD-10-CM | POA: Diagnosis not present

## 2024-08-28 DIAGNOSIS — N186 End stage renal disease: Secondary | ICD-10-CM | POA: Diagnosis not present

## 2024-08-28 DIAGNOSIS — Z4932 Encounter for adequacy testing for peritoneal dialysis: Secondary | ICD-10-CM | POA: Diagnosis not present

## 2024-08-28 DIAGNOSIS — E878 Other disorders of electrolyte and fluid balance, not elsewhere classified: Secondary | ICD-10-CM | POA: Diagnosis not present

## 2024-08-29 DIAGNOSIS — D630 Anemia in neoplastic disease: Secondary | ICD-10-CM | POA: Diagnosis not present

## 2024-08-29 DIAGNOSIS — N2581 Secondary hyperparathyroidism of renal origin: Secondary | ICD-10-CM | POA: Diagnosis not present

## 2024-08-29 DIAGNOSIS — N186 End stage renal disease: Secondary | ICD-10-CM | POA: Diagnosis not present

## 2024-08-29 DIAGNOSIS — Z4932 Encounter for adequacy testing for peritoneal dialysis: Secondary | ICD-10-CM | POA: Diagnosis not present

## 2024-08-29 DIAGNOSIS — Z79899 Other long term (current) drug therapy: Secondary | ICD-10-CM | POA: Diagnosis not present

## 2024-08-29 DIAGNOSIS — Z992 Dependence on renal dialysis: Secondary | ICD-10-CM | POA: Diagnosis not present

## 2024-08-29 DIAGNOSIS — D509 Iron deficiency anemia, unspecified: Secondary | ICD-10-CM | POA: Diagnosis not present

## 2024-08-29 DIAGNOSIS — K7689 Other specified diseases of liver: Secondary | ICD-10-CM | POA: Diagnosis not present

## 2024-08-29 DIAGNOSIS — E878 Other disorders of electrolyte and fluid balance, not elsewhere classified: Secondary | ICD-10-CM | POA: Diagnosis not present

## 2024-08-29 DIAGNOSIS — E440 Moderate protein-calorie malnutrition: Secondary | ICD-10-CM | POA: Diagnosis not present

## 2024-08-30 ENCOUNTER — Ambulatory Visit: Payer: Self-pay | Admitting: Cardiology

## 2024-08-30 DIAGNOSIS — K7689 Other specified diseases of liver: Secondary | ICD-10-CM | POA: Diagnosis not present

## 2024-08-30 DIAGNOSIS — Z992 Dependence on renal dialysis: Secondary | ICD-10-CM | POA: Diagnosis not present

## 2024-08-30 DIAGNOSIS — I4891 Unspecified atrial fibrillation: Secondary | ICD-10-CM

## 2024-08-30 DIAGNOSIS — Z4932 Encounter for adequacy testing for peritoneal dialysis: Secondary | ICD-10-CM | POA: Diagnosis not present

## 2024-08-30 DIAGNOSIS — Z79899 Other long term (current) drug therapy: Secondary | ICD-10-CM | POA: Diagnosis not present

## 2024-08-30 DIAGNOSIS — N186 End stage renal disease: Secondary | ICD-10-CM | POA: Diagnosis not present

## 2024-08-30 DIAGNOSIS — D630 Anemia in neoplastic disease: Secondary | ICD-10-CM | POA: Diagnosis not present

## 2024-08-30 DIAGNOSIS — E878 Other disorders of electrolyte and fluid balance, not elsewhere classified: Secondary | ICD-10-CM | POA: Diagnosis not present

## 2024-08-30 DIAGNOSIS — N2581 Secondary hyperparathyroidism of renal origin: Secondary | ICD-10-CM | POA: Diagnosis not present

## 2024-08-30 DIAGNOSIS — D509 Iron deficiency anemia, unspecified: Secondary | ICD-10-CM | POA: Diagnosis not present

## 2024-08-30 DIAGNOSIS — E440 Moderate protein-calorie malnutrition: Secondary | ICD-10-CM | POA: Diagnosis not present

## 2024-08-31 DIAGNOSIS — Z4932 Encounter for adequacy testing for peritoneal dialysis: Secondary | ICD-10-CM | POA: Diagnosis not present

## 2024-08-31 DIAGNOSIS — Z79899 Other long term (current) drug therapy: Secondary | ICD-10-CM | POA: Diagnosis not present

## 2024-08-31 DIAGNOSIS — E440 Moderate protein-calorie malnutrition: Secondary | ICD-10-CM | POA: Diagnosis not present

## 2024-08-31 DIAGNOSIS — N186 End stage renal disease: Secondary | ICD-10-CM | POA: Diagnosis not present

## 2024-08-31 DIAGNOSIS — D630 Anemia in neoplastic disease: Secondary | ICD-10-CM | POA: Diagnosis not present

## 2024-08-31 DIAGNOSIS — K7689 Other specified diseases of liver: Secondary | ICD-10-CM | POA: Diagnosis not present

## 2024-08-31 DIAGNOSIS — D509 Iron deficiency anemia, unspecified: Secondary | ICD-10-CM | POA: Diagnosis not present

## 2024-08-31 DIAGNOSIS — Z992 Dependence on renal dialysis: Secondary | ICD-10-CM | POA: Diagnosis not present

## 2024-08-31 DIAGNOSIS — N2581 Secondary hyperparathyroidism of renal origin: Secondary | ICD-10-CM | POA: Diagnosis not present

## 2024-08-31 DIAGNOSIS — E878 Other disorders of electrolyte and fluid balance, not elsewhere classified: Secondary | ICD-10-CM | POA: Diagnosis not present

## 2024-09-01 DIAGNOSIS — D630 Anemia in neoplastic disease: Secondary | ICD-10-CM | POA: Diagnosis not present

## 2024-09-01 DIAGNOSIS — N186 End stage renal disease: Secondary | ICD-10-CM | POA: Diagnosis not present

## 2024-09-01 DIAGNOSIS — E440 Moderate protein-calorie malnutrition: Secondary | ICD-10-CM | POA: Diagnosis not present

## 2024-09-01 DIAGNOSIS — D509 Iron deficiency anemia, unspecified: Secondary | ICD-10-CM | POA: Diagnosis not present

## 2024-09-01 DIAGNOSIS — E878 Other disorders of electrolyte and fluid balance, not elsewhere classified: Secondary | ICD-10-CM | POA: Diagnosis not present

## 2024-09-01 DIAGNOSIS — Z79899 Other long term (current) drug therapy: Secondary | ICD-10-CM | POA: Diagnosis not present

## 2024-09-01 DIAGNOSIS — K7689 Other specified diseases of liver: Secondary | ICD-10-CM | POA: Diagnosis not present

## 2024-09-01 DIAGNOSIS — Z4932 Encounter for adequacy testing for peritoneal dialysis: Secondary | ICD-10-CM | POA: Diagnosis not present

## 2024-09-01 DIAGNOSIS — N2581 Secondary hyperparathyroidism of renal origin: Secondary | ICD-10-CM | POA: Diagnosis not present

## 2024-09-01 DIAGNOSIS — Z992 Dependence on renal dialysis: Secondary | ICD-10-CM | POA: Diagnosis not present

## 2024-09-02 DIAGNOSIS — E878 Other disorders of electrolyte and fluid balance, not elsewhere classified: Secondary | ICD-10-CM | POA: Diagnosis not present

## 2024-09-02 DIAGNOSIS — Z992 Dependence on renal dialysis: Secondary | ICD-10-CM | POA: Diagnosis not present

## 2024-09-02 DIAGNOSIS — D630 Anemia in neoplastic disease: Secondary | ICD-10-CM | POA: Diagnosis not present

## 2024-09-02 DIAGNOSIS — D509 Iron deficiency anemia, unspecified: Secondary | ICD-10-CM | POA: Diagnosis not present

## 2024-09-02 DIAGNOSIS — Z4932 Encounter for adequacy testing for peritoneal dialysis: Secondary | ICD-10-CM | POA: Diagnosis not present

## 2024-09-02 DIAGNOSIS — E440 Moderate protein-calorie malnutrition: Secondary | ICD-10-CM | POA: Diagnosis not present

## 2024-09-02 DIAGNOSIS — Z79899 Other long term (current) drug therapy: Secondary | ICD-10-CM | POA: Diagnosis not present

## 2024-09-02 DIAGNOSIS — N186 End stage renal disease: Secondary | ICD-10-CM | POA: Diagnosis not present

## 2024-09-02 DIAGNOSIS — N2581 Secondary hyperparathyroidism of renal origin: Secondary | ICD-10-CM | POA: Diagnosis not present

## 2024-09-02 DIAGNOSIS — K7689 Other specified diseases of liver: Secondary | ICD-10-CM | POA: Diagnosis not present

## 2024-09-03 DIAGNOSIS — K7689 Other specified diseases of liver: Secondary | ICD-10-CM | POA: Diagnosis not present

## 2024-09-03 DIAGNOSIS — D630 Anemia in neoplastic disease: Secondary | ICD-10-CM | POA: Diagnosis not present

## 2024-09-03 DIAGNOSIS — Z79899 Other long term (current) drug therapy: Secondary | ICD-10-CM | POA: Diagnosis not present

## 2024-09-03 DIAGNOSIS — D509 Iron deficiency anemia, unspecified: Secondary | ICD-10-CM | POA: Diagnosis not present

## 2024-09-03 DIAGNOSIS — E878 Other disorders of electrolyte and fluid balance, not elsewhere classified: Secondary | ICD-10-CM | POA: Diagnosis not present

## 2024-09-03 DIAGNOSIS — N186 End stage renal disease: Secondary | ICD-10-CM | POA: Diagnosis not present

## 2024-09-03 DIAGNOSIS — E440 Moderate protein-calorie malnutrition: Secondary | ICD-10-CM | POA: Diagnosis not present

## 2024-09-03 DIAGNOSIS — Z4932 Encounter for adequacy testing for peritoneal dialysis: Secondary | ICD-10-CM | POA: Diagnosis not present

## 2024-09-03 DIAGNOSIS — N2581 Secondary hyperparathyroidism of renal origin: Secondary | ICD-10-CM | POA: Diagnosis not present

## 2024-09-03 DIAGNOSIS — Z992 Dependence on renal dialysis: Secondary | ICD-10-CM | POA: Diagnosis not present

## 2024-09-04 DIAGNOSIS — D509 Iron deficiency anemia, unspecified: Secondary | ICD-10-CM | POA: Diagnosis not present

## 2024-09-04 DIAGNOSIS — Z79899 Other long term (current) drug therapy: Secondary | ICD-10-CM | POA: Diagnosis not present

## 2024-09-04 DIAGNOSIS — K7689 Other specified diseases of liver: Secondary | ICD-10-CM | POA: Diagnosis not present

## 2024-09-04 DIAGNOSIS — N186 End stage renal disease: Secondary | ICD-10-CM | POA: Diagnosis not present

## 2024-09-04 DIAGNOSIS — N2581 Secondary hyperparathyroidism of renal origin: Secondary | ICD-10-CM | POA: Diagnosis not present

## 2024-09-04 DIAGNOSIS — E878 Other disorders of electrolyte and fluid balance, not elsewhere classified: Secondary | ICD-10-CM | POA: Diagnosis not present

## 2024-09-04 DIAGNOSIS — E440 Moderate protein-calorie malnutrition: Secondary | ICD-10-CM | POA: Diagnosis not present

## 2024-09-04 DIAGNOSIS — Z4932 Encounter for adequacy testing for peritoneal dialysis: Secondary | ICD-10-CM | POA: Diagnosis not present

## 2024-09-04 DIAGNOSIS — Z992 Dependence on renal dialysis: Secondary | ICD-10-CM | POA: Diagnosis not present

## 2024-09-04 DIAGNOSIS — D630 Anemia in neoplastic disease: Secondary | ICD-10-CM | POA: Diagnosis not present

## 2024-09-05 DIAGNOSIS — D509 Iron deficiency anemia, unspecified: Secondary | ICD-10-CM | POA: Diagnosis not present

## 2024-09-05 DIAGNOSIS — N2581 Secondary hyperparathyroidism of renal origin: Secondary | ICD-10-CM | POA: Diagnosis not present

## 2024-09-05 DIAGNOSIS — N186 End stage renal disease: Secondary | ICD-10-CM | POA: Diagnosis not present

## 2024-09-05 DIAGNOSIS — Z4932 Encounter for adequacy testing for peritoneal dialysis: Secondary | ICD-10-CM | POA: Diagnosis not present

## 2024-09-05 DIAGNOSIS — Z79899 Other long term (current) drug therapy: Secondary | ICD-10-CM | POA: Diagnosis not present

## 2024-09-05 DIAGNOSIS — E878 Other disorders of electrolyte and fluid balance, not elsewhere classified: Secondary | ICD-10-CM | POA: Diagnosis not present

## 2024-09-05 DIAGNOSIS — K7689 Other specified diseases of liver: Secondary | ICD-10-CM | POA: Diagnosis not present

## 2024-09-05 DIAGNOSIS — D630 Anemia in neoplastic disease: Secondary | ICD-10-CM | POA: Diagnosis not present

## 2024-09-05 DIAGNOSIS — Z992 Dependence on renal dialysis: Secondary | ICD-10-CM | POA: Diagnosis not present

## 2024-09-05 DIAGNOSIS — E440 Moderate protein-calorie malnutrition: Secondary | ICD-10-CM | POA: Diagnosis not present

## 2024-09-06 DIAGNOSIS — Z4932 Encounter for adequacy testing for peritoneal dialysis: Secondary | ICD-10-CM | POA: Diagnosis not present

## 2024-09-06 DIAGNOSIS — N2581 Secondary hyperparathyroidism of renal origin: Secondary | ICD-10-CM | POA: Diagnosis not present

## 2024-09-06 DIAGNOSIS — E878 Other disorders of electrolyte and fluid balance, not elsewhere classified: Secondary | ICD-10-CM | POA: Diagnosis not present

## 2024-09-06 DIAGNOSIS — D509 Iron deficiency anemia, unspecified: Secondary | ICD-10-CM | POA: Diagnosis not present

## 2024-09-06 DIAGNOSIS — Z79899 Other long term (current) drug therapy: Secondary | ICD-10-CM | POA: Diagnosis not present

## 2024-09-06 DIAGNOSIS — N186 End stage renal disease: Secondary | ICD-10-CM | POA: Diagnosis not present

## 2024-09-06 DIAGNOSIS — E440 Moderate protein-calorie malnutrition: Secondary | ICD-10-CM | POA: Diagnosis not present

## 2024-09-06 DIAGNOSIS — D630 Anemia in neoplastic disease: Secondary | ICD-10-CM | POA: Diagnosis not present

## 2024-09-06 DIAGNOSIS — Z992 Dependence on renal dialysis: Secondary | ICD-10-CM | POA: Diagnosis not present

## 2024-09-06 DIAGNOSIS — K7689 Other specified diseases of liver: Secondary | ICD-10-CM | POA: Diagnosis not present

## 2024-09-07 DIAGNOSIS — N2581 Secondary hyperparathyroidism of renal origin: Secondary | ICD-10-CM | POA: Diagnosis not present

## 2024-09-07 DIAGNOSIS — Z4932 Encounter for adequacy testing for peritoneal dialysis: Secondary | ICD-10-CM | POA: Diagnosis not present

## 2024-09-07 DIAGNOSIS — D630 Anemia in neoplastic disease: Secondary | ICD-10-CM | POA: Diagnosis not present

## 2024-09-07 DIAGNOSIS — E878 Other disorders of electrolyte and fluid balance, not elsewhere classified: Secondary | ICD-10-CM | POA: Diagnosis not present

## 2024-09-07 DIAGNOSIS — K7689 Other specified diseases of liver: Secondary | ICD-10-CM | POA: Diagnosis not present

## 2024-09-07 DIAGNOSIS — Z79899 Other long term (current) drug therapy: Secondary | ICD-10-CM | POA: Diagnosis not present

## 2024-09-07 DIAGNOSIS — E440 Moderate protein-calorie malnutrition: Secondary | ICD-10-CM | POA: Diagnosis not present

## 2024-09-07 DIAGNOSIS — N186 End stage renal disease: Secondary | ICD-10-CM | POA: Diagnosis not present

## 2024-09-07 DIAGNOSIS — D509 Iron deficiency anemia, unspecified: Secondary | ICD-10-CM | POA: Diagnosis not present

## 2024-09-07 DIAGNOSIS — Z992 Dependence on renal dialysis: Secondary | ICD-10-CM | POA: Diagnosis not present

## 2024-09-08 DIAGNOSIS — Z992 Dependence on renal dialysis: Secondary | ICD-10-CM | POA: Diagnosis not present

## 2024-09-08 DIAGNOSIS — D509 Iron deficiency anemia, unspecified: Secondary | ICD-10-CM | POA: Diagnosis not present

## 2024-09-08 DIAGNOSIS — N2581 Secondary hyperparathyroidism of renal origin: Secondary | ICD-10-CM | POA: Diagnosis not present

## 2024-09-08 DIAGNOSIS — Z79899 Other long term (current) drug therapy: Secondary | ICD-10-CM | POA: Diagnosis not present

## 2024-09-08 DIAGNOSIS — E878 Other disorders of electrolyte and fluid balance, not elsewhere classified: Secondary | ICD-10-CM | POA: Diagnosis not present

## 2024-09-08 DIAGNOSIS — K7689 Other specified diseases of liver: Secondary | ICD-10-CM | POA: Diagnosis not present

## 2024-09-08 DIAGNOSIS — D630 Anemia in neoplastic disease: Secondary | ICD-10-CM | POA: Diagnosis not present

## 2024-09-08 DIAGNOSIS — E440 Moderate protein-calorie malnutrition: Secondary | ICD-10-CM | POA: Diagnosis not present

## 2024-09-08 DIAGNOSIS — N186 End stage renal disease: Secondary | ICD-10-CM | POA: Diagnosis not present

## 2024-09-08 DIAGNOSIS — Z4932 Encounter for adequacy testing for peritoneal dialysis: Secondary | ICD-10-CM | POA: Diagnosis not present

## 2024-09-09 DIAGNOSIS — Z4932 Encounter for adequacy testing for peritoneal dialysis: Secondary | ICD-10-CM | POA: Diagnosis not present

## 2024-09-09 DIAGNOSIS — N2581 Secondary hyperparathyroidism of renal origin: Secondary | ICD-10-CM | POA: Diagnosis not present

## 2024-09-09 DIAGNOSIS — N186 End stage renal disease: Secondary | ICD-10-CM | POA: Diagnosis not present

## 2024-09-09 DIAGNOSIS — D509 Iron deficiency anemia, unspecified: Secondary | ICD-10-CM | POA: Diagnosis not present

## 2024-09-09 DIAGNOSIS — E440 Moderate protein-calorie malnutrition: Secondary | ICD-10-CM | POA: Diagnosis not present

## 2024-09-09 DIAGNOSIS — E878 Other disorders of electrolyte and fluid balance, not elsewhere classified: Secondary | ICD-10-CM | POA: Diagnosis not present

## 2024-09-09 DIAGNOSIS — Z992 Dependence on renal dialysis: Secondary | ICD-10-CM | POA: Diagnosis not present

## 2024-09-09 DIAGNOSIS — D630 Anemia in neoplastic disease: Secondary | ICD-10-CM | POA: Diagnosis not present

## 2024-09-09 DIAGNOSIS — Z79899 Other long term (current) drug therapy: Secondary | ICD-10-CM | POA: Diagnosis not present

## 2024-09-09 DIAGNOSIS — K7689 Other specified diseases of liver: Secondary | ICD-10-CM | POA: Diagnosis not present

## 2024-09-10 DIAGNOSIS — N2581 Secondary hyperparathyroidism of renal origin: Secondary | ICD-10-CM | POA: Diagnosis not present

## 2024-09-10 DIAGNOSIS — Z4932 Encounter for adequacy testing for peritoneal dialysis: Secondary | ICD-10-CM | POA: Diagnosis not present

## 2024-09-10 DIAGNOSIS — E878 Other disorders of electrolyte and fluid balance, not elsewhere classified: Secondary | ICD-10-CM | POA: Diagnosis not present

## 2024-09-10 DIAGNOSIS — Z79899 Other long term (current) drug therapy: Secondary | ICD-10-CM | POA: Diagnosis not present

## 2024-09-10 DIAGNOSIS — K7689 Other specified diseases of liver: Secondary | ICD-10-CM | POA: Diagnosis not present

## 2024-09-10 DIAGNOSIS — D630 Anemia in neoplastic disease: Secondary | ICD-10-CM | POA: Diagnosis not present

## 2024-09-10 DIAGNOSIS — N186 End stage renal disease: Secondary | ICD-10-CM | POA: Diagnosis not present

## 2024-09-10 DIAGNOSIS — Z992 Dependence on renal dialysis: Secondary | ICD-10-CM | POA: Diagnosis not present

## 2024-09-10 DIAGNOSIS — E440 Moderate protein-calorie malnutrition: Secondary | ICD-10-CM | POA: Diagnosis not present

## 2024-09-10 DIAGNOSIS — D509 Iron deficiency anemia, unspecified: Secondary | ICD-10-CM | POA: Diagnosis not present

## 2024-09-11 DIAGNOSIS — E440 Moderate protein-calorie malnutrition: Secondary | ICD-10-CM | POA: Diagnosis not present

## 2024-09-11 DIAGNOSIS — N2581 Secondary hyperparathyroidism of renal origin: Secondary | ICD-10-CM | POA: Diagnosis not present

## 2024-09-11 DIAGNOSIS — D509 Iron deficiency anemia, unspecified: Secondary | ICD-10-CM | POA: Diagnosis not present

## 2024-09-11 DIAGNOSIS — K7689 Other specified diseases of liver: Secondary | ICD-10-CM | POA: Diagnosis not present

## 2024-09-11 DIAGNOSIS — Z79899 Other long term (current) drug therapy: Secondary | ICD-10-CM | POA: Diagnosis not present

## 2024-09-11 DIAGNOSIS — D630 Anemia in neoplastic disease: Secondary | ICD-10-CM | POA: Diagnosis not present

## 2024-09-11 DIAGNOSIS — E878 Other disorders of electrolyte and fluid balance, not elsewhere classified: Secondary | ICD-10-CM | POA: Diagnosis not present

## 2024-09-11 DIAGNOSIS — Z4932 Encounter for adequacy testing for peritoneal dialysis: Secondary | ICD-10-CM | POA: Diagnosis not present

## 2024-09-11 DIAGNOSIS — N186 End stage renal disease: Secondary | ICD-10-CM | POA: Diagnosis not present

## 2024-09-11 DIAGNOSIS — Z992 Dependence on renal dialysis: Secondary | ICD-10-CM | POA: Diagnosis not present

## 2024-09-12 DIAGNOSIS — K7689 Other specified diseases of liver: Secondary | ICD-10-CM | POA: Diagnosis not present

## 2024-09-12 DIAGNOSIS — E878 Other disorders of electrolyte and fluid balance, not elsewhere classified: Secondary | ICD-10-CM | POA: Diagnosis not present

## 2024-09-12 DIAGNOSIS — Z4932 Encounter for adequacy testing for peritoneal dialysis: Secondary | ICD-10-CM | POA: Diagnosis not present

## 2024-09-12 DIAGNOSIS — N186 End stage renal disease: Secondary | ICD-10-CM | POA: Diagnosis not present

## 2024-09-12 DIAGNOSIS — D509 Iron deficiency anemia, unspecified: Secondary | ICD-10-CM | POA: Diagnosis not present

## 2024-09-12 DIAGNOSIS — E440 Moderate protein-calorie malnutrition: Secondary | ICD-10-CM | POA: Diagnosis not present

## 2024-09-12 DIAGNOSIS — D630 Anemia in neoplastic disease: Secondary | ICD-10-CM | POA: Diagnosis not present

## 2024-09-12 DIAGNOSIS — N2581 Secondary hyperparathyroidism of renal origin: Secondary | ICD-10-CM | POA: Diagnosis not present

## 2024-09-12 DIAGNOSIS — Z79899 Other long term (current) drug therapy: Secondary | ICD-10-CM | POA: Diagnosis not present

## 2024-09-12 DIAGNOSIS — Z992 Dependence on renal dialysis: Secondary | ICD-10-CM | POA: Diagnosis not present

## 2024-09-13 DIAGNOSIS — N186 End stage renal disease: Secondary | ICD-10-CM | POA: Diagnosis not present

## 2024-09-13 DIAGNOSIS — N2581 Secondary hyperparathyroidism of renal origin: Secondary | ICD-10-CM | POA: Diagnosis not present

## 2024-09-13 DIAGNOSIS — Z79899 Other long term (current) drug therapy: Secondary | ICD-10-CM | POA: Diagnosis not present

## 2024-09-13 DIAGNOSIS — D630 Anemia in neoplastic disease: Secondary | ICD-10-CM | POA: Diagnosis not present

## 2024-09-13 DIAGNOSIS — E878 Other disorders of electrolyte and fluid balance, not elsewhere classified: Secondary | ICD-10-CM | POA: Diagnosis not present

## 2024-09-13 DIAGNOSIS — D509 Iron deficiency anemia, unspecified: Secondary | ICD-10-CM | POA: Diagnosis not present

## 2024-09-13 DIAGNOSIS — Z992 Dependence on renal dialysis: Secondary | ICD-10-CM | POA: Diagnosis not present

## 2024-09-13 DIAGNOSIS — K7689 Other specified diseases of liver: Secondary | ICD-10-CM | POA: Diagnosis not present

## 2024-09-13 DIAGNOSIS — Z4932 Encounter for adequacy testing for peritoneal dialysis: Secondary | ICD-10-CM | POA: Diagnosis not present

## 2024-09-13 DIAGNOSIS — E440 Moderate protein-calorie malnutrition: Secondary | ICD-10-CM | POA: Diagnosis not present

## 2024-09-14 DIAGNOSIS — Z4932 Encounter for adequacy testing for peritoneal dialysis: Secondary | ICD-10-CM | POA: Diagnosis not present

## 2024-09-14 DIAGNOSIS — N2581 Secondary hyperparathyroidism of renal origin: Secondary | ICD-10-CM | POA: Diagnosis not present

## 2024-09-14 DIAGNOSIS — Z992 Dependence on renal dialysis: Secondary | ICD-10-CM | POA: Diagnosis not present

## 2024-09-14 DIAGNOSIS — E878 Other disorders of electrolyte and fluid balance, not elsewhere classified: Secondary | ICD-10-CM | POA: Diagnosis not present

## 2024-09-14 DIAGNOSIS — K7689 Other specified diseases of liver: Secondary | ICD-10-CM | POA: Diagnosis not present

## 2024-09-14 DIAGNOSIS — N186 End stage renal disease: Secondary | ICD-10-CM | POA: Diagnosis not present

## 2024-09-14 DIAGNOSIS — Z79899 Other long term (current) drug therapy: Secondary | ICD-10-CM | POA: Diagnosis not present

## 2024-09-14 DIAGNOSIS — E440 Moderate protein-calorie malnutrition: Secondary | ICD-10-CM | POA: Diagnosis not present

## 2024-09-14 DIAGNOSIS — D630 Anemia in neoplastic disease: Secondary | ICD-10-CM | POA: Diagnosis not present

## 2024-09-14 DIAGNOSIS — D509 Iron deficiency anemia, unspecified: Secondary | ICD-10-CM | POA: Diagnosis not present

## 2024-09-15 DIAGNOSIS — Z79899 Other long term (current) drug therapy: Secondary | ICD-10-CM | POA: Diagnosis not present

## 2024-09-15 DIAGNOSIS — Z992 Dependence on renal dialysis: Secondary | ICD-10-CM | POA: Diagnosis not present

## 2024-09-15 DIAGNOSIS — N2581 Secondary hyperparathyroidism of renal origin: Secondary | ICD-10-CM | POA: Diagnosis not present

## 2024-09-15 DIAGNOSIS — E878 Other disorders of electrolyte and fluid balance, not elsewhere classified: Secondary | ICD-10-CM | POA: Diagnosis not present

## 2024-09-15 DIAGNOSIS — N186 End stage renal disease: Secondary | ICD-10-CM | POA: Diagnosis not present

## 2024-09-15 DIAGNOSIS — E440 Moderate protein-calorie malnutrition: Secondary | ICD-10-CM | POA: Diagnosis not present

## 2024-09-15 DIAGNOSIS — Z4932 Encounter for adequacy testing for peritoneal dialysis: Secondary | ICD-10-CM | POA: Diagnosis not present

## 2024-09-15 DIAGNOSIS — D509 Iron deficiency anemia, unspecified: Secondary | ICD-10-CM | POA: Diagnosis not present

## 2024-09-15 DIAGNOSIS — D630 Anemia in neoplastic disease: Secondary | ICD-10-CM | POA: Diagnosis not present

## 2024-09-15 DIAGNOSIS — K7689 Other specified diseases of liver: Secondary | ICD-10-CM | POA: Diagnosis not present

## 2024-09-16 DIAGNOSIS — E878 Other disorders of electrolyte and fluid balance, not elsewhere classified: Secondary | ICD-10-CM | POA: Diagnosis not present

## 2024-09-16 DIAGNOSIS — N186 End stage renal disease: Secondary | ICD-10-CM | POA: Diagnosis not present

## 2024-09-16 DIAGNOSIS — N2581 Secondary hyperparathyroidism of renal origin: Secondary | ICD-10-CM | POA: Diagnosis not present

## 2024-09-16 DIAGNOSIS — Z79899 Other long term (current) drug therapy: Secondary | ICD-10-CM | POA: Diagnosis not present

## 2024-09-16 DIAGNOSIS — D509 Iron deficiency anemia, unspecified: Secondary | ICD-10-CM | POA: Diagnosis not present

## 2024-09-16 DIAGNOSIS — Z4932 Encounter for adequacy testing for peritoneal dialysis: Secondary | ICD-10-CM | POA: Diagnosis not present

## 2024-09-16 DIAGNOSIS — D630 Anemia in neoplastic disease: Secondary | ICD-10-CM | POA: Diagnosis not present

## 2024-09-16 DIAGNOSIS — E440 Moderate protein-calorie malnutrition: Secondary | ICD-10-CM | POA: Diagnosis not present

## 2024-09-16 DIAGNOSIS — Z992 Dependence on renal dialysis: Secondary | ICD-10-CM | POA: Diagnosis not present

## 2024-09-16 DIAGNOSIS — K7689 Other specified diseases of liver: Secondary | ICD-10-CM | POA: Diagnosis not present

## 2024-09-17 DIAGNOSIS — D630 Anemia in neoplastic disease: Secondary | ICD-10-CM | POA: Diagnosis not present

## 2024-09-17 DIAGNOSIS — Z79899 Other long term (current) drug therapy: Secondary | ICD-10-CM | POA: Diagnosis not present

## 2024-09-17 DIAGNOSIS — N186 End stage renal disease: Secondary | ICD-10-CM | POA: Diagnosis not present

## 2024-09-17 DIAGNOSIS — K7689 Other specified diseases of liver: Secondary | ICD-10-CM | POA: Diagnosis not present

## 2024-09-17 DIAGNOSIS — E878 Other disorders of electrolyte and fluid balance, not elsewhere classified: Secondary | ICD-10-CM | POA: Diagnosis not present

## 2024-09-17 DIAGNOSIS — Z4932 Encounter for adequacy testing for peritoneal dialysis: Secondary | ICD-10-CM | POA: Diagnosis not present

## 2024-09-17 DIAGNOSIS — D509 Iron deficiency anemia, unspecified: Secondary | ICD-10-CM | POA: Diagnosis not present

## 2024-09-17 DIAGNOSIS — E440 Moderate protein-calorie malnutrition: Secondary | ICD-10-CM | POA: Diagnosis not present

## 2024-09-17 DIAGNOSIS — Z992 Dependence on renal dialysis: Secondary | ICD-10-CM | POA: Diagnosis not present

## 2024-09-17 DIAGNOSIS — N2581 Secondary hyperparathyroidism of renal origin: Secondary | ICD-10-CM | POA: Diagnosis not present

## 2024-09-18 DIAGNOSIS — E440 Moderate protein-calorie malnutrition: Secondary | ICD-10-CM | POA: Diagnosis not present

## 2024-09-18 DIAGNOSIS — K7689 Other specified diseases of liver: Secondary | ICD-10-CM | POA: Diagnosis not present

## 2024-09-18 DIAGNOSIS — Z79899 Other long term (current) drug therapy: Secondary | ICD-10-CM | POA: Diagnosis not present

## 2024-09-18 DIAGNOSIS — D509 Iron deficiency anemia, unspecified: Secondary | ICD-10-CM | POA: Diagnosis not present

## 2024-09-18 DIAGNOSIS — N186 End stage renal disease: Secondary | ICD-10-CM | POA: Diagnosis not present

## 2024-09-18 DIAGNOSIS — D630 Anemia in neoplastic disease: Secondary | ICD-10-CM | POA: Diagnosis not present

## 2024-09-18 DIAGNOSIS — Z992 Dependence on renal dialysis: Secondary | ICD-10-CM | POA: Diagnosis not present

## 2024-09-18 DIAGNOSIS — N2581 Secondary hyperparathyroidism of renal origin: Secondary | ICD-10-CM | POA: Diagnosis not present

## 2024-09-18 DIAGNOSIS — E878 Other disorders of electrolyte and fluid balance, not elsewhere classified: Secondary | ICD-10-CM | POA: Diagnosis not present

## 2024-09-18 DIAGNOSIS — Z4932 Encounter for adequacy testing for peritoneal dialysis: Secondary | ICD-10-CM | POA: Diagnosis not present

## 2024-09-19 DIAGNOSIS — D630 Anemia in neoplastic disease: Secondary | ICD-10-CM | POA: Diagnosis not present

## 2024-09-19 DIAGNOSIS — K7689 Other specified diseases of liver: Secondary | ICD-10-CM | POA: Diagnosis not present

## 2024-09-19 DIAGNOSIS — E440 Moderate protein-calorie malnutrition: Secondary | ICD-10-CM | POA: Diagnosis not present

## 2024-09-19 DIAGNOSIS — D509 Iron deficiency anemia, unspecified: Secondary | ICD-10-CM | POA: Diagnosis not present

## 2024-09-19 DIAGNOSIS — Z4932 Encounter for adequacy testing for peritoneal dialysis: Secondary | ICD-10-CM | POA: Diagnosis not present

## 2024-09-19 DIAGNOSIS — Z79899 Other long term (current) drug therapy: Secondary | ICD-10-CM | POA: Diagnosis not present

## 2024-09-19 DIAGNOSIS — E878 Other disorders of electrolyte and fluid balance, not elsewhere classified: Secondary | ICD-10-CM | POA: Diagnosis not present

## 2024-09-19 DIAGNOSIS — N2581 Secondary hyperparathyroidism of renal origin: Secondary | ICD-10-CM | POA: Diagnosis not present

## 2024-09-19 DIAGNOSIS — Z992 Dependence on renal dialysis: Secondary | ICD-10-CM | POA: Diagnosis not present

## 2024-09-19 DIAGNOSIS — N186 End stage renal disease: Secondary | ICD-10-CM | POA: Diagnosis not present

## 2024-09-20 DIAGNOSIS — E878 Other disorders of electrolyte and fluid balance, not elsewhere classified: Secondary | ICD-10-CM | POA: Diagnosis not present

## 2024-09-20 DIAGNOSIS — Z79899 Other long term (current) drug therapy: Secondary | ICD-10-CM | POA: Diagnosis not present

## 2024-09-20 DIAGNOSIS — Z992 Dependence on renal dialysis: Secondary | ICD-10-CM | POA: Diagnosis not present

## 2024-09-20 DIAGNOSIS — K7689 Other specified diseases of liver: Secondary | ICD-10-CM | POA: Diagnosis not present

## 2024-09-20 DIAGNOSIS — N2581 Secondary hyperparathyroidism of renal origin: Secondary | ICD-10-CM | POA: Diagnosis not present

## 2024-09-20 DIAGNOSIS — E440 Moderate protein-calorie malnutrition: Secondary | ICD-10-CM | POA: Diagnosis not present

## 2024-09-20 DIAGNOSIS — D509 Iron deficiency anemia, unspecified: Secondary | ICD-10-CM | POA: Diagnosis not present

## 2024-09-20 DIAGNOSIS — N186 End stage renal disease: Secondary | ICD-10-CM | POA: Diagnosis not present

## 2024-09-20 DIAGNOSIS — D630 Anemia in neoplastic disease: Secondary | ICD-10-CM | POA: Diagnosis not present

## 2024-09-20 DIAGNOSIS — Z4932 Encounter for adequacy testing for peritoneal dialysis: Secondary | ICD-10-CM | POA: Diagnosis not present

## 2024-09-21 DIAGNOSIS — E878 Other disorders of electrolyte and fluid balance, not elsewhere classified: Secondary | ICD-10-CM | POA: Diagnosis not present

## 2024-09-21 DIAGNOSIS — Z992 Dependence on renal dialysis: Secondary | ICD-10-CM | POA: Diagnosis not present

## 2024-09-21 DIAGNOSIS — N186 End stage renal disease: Secondary | ICD-10-CM | POA: Diagnosis not present

## 2024-09-21 DIAGNOSIS — K7689 Other specified diseases of liver: Secondary | ICD-10-CM | POA: Diagnosis not present

## 2024-09-21 DIAGNOSIS — N2581 Secondary hyperparathyroidism of renal origin: Secondary | ICD-10-CM | POA: Diagnosis not present

## 2024-09-21 DIAGNOSIS — Z4932 Encounter for adequacy testing for peritoneal dialysis: Secondary | ICD-10-CM | POA: Diagnosis not present

## 2024-09-21 DIAGNOSIS — D509 Iron deficiency anemia, unspecified: Secondary | ICD-10-CM | POA: Diagnosis not present

## 2024-09-21 DIAGNOSIS — E440 Moderate protein-calorie malnutrition: Secondary | ICD-10-CM | POA: Diagnosis not present

## 2024-09-21 DIAGNOSIS — Z79899 Other long term (current) drug therapy: Secondary | ICD-10-CM | POA: Diagnosis not present

## 2024-09-21 DIAGNOSIS — E1129 Type 2 diabetes mellitus with other diabetic kidney complication: Secondary | ICD-10-CM | POA: Diagnosis not present

## 2024-09-21 DIAGNOSIS — D630 Anemia in neoplastic disease: Secondary | ICD-10-CM | POA: Diagnosis not present

## 2024-10-08 DIAGNOSIS — D485 Neoplasm of uncertain behavior of skin: Secondary | ICD-10-CM | POA: Diagnosis not present

## 2024-10-08 DIAGNOSIS — Z85828 Personal history of other malignant neoplasm of skin: Secondary | ICD-10-CM | POA: Diagnosis not present

## 2024-10-08 DIAGNOSIS — L814 Other melanin hyperpigmentation: Secondary | ICD-10-CM | POA: Diagnosis not present

## 2024-10-08 DIAGNOSIS — C44729 Squamous cell carcinoma of skin of left lower limb, including hip: Secondary | ICD-10-CM | POA: Diagnosis not present

## 2024-10-08 DIAGNOSIS — C44629 Squamous cell carcinoma of skin of left upper limb, including shoulder: Secondary | ICD-10-CM | POA: Diagnosis not present

## 2024-10-08 DIAGNOSIS — B078 Other viral warts: Secondary | ICD-10-CM | POA: Diagnosis not present

## 2024-10-08 DIAGNOSIS — L578 Other skin changes due to chronic exposure to nonionizing radiation: Secondary | ICD-10-CM | POA: Diagnosis not present

## 2024-10-08 DIAGNOSIS — L570 Actinic keratosis: Secondary | ICD-10-CM | POA: Diagnosis not present

## 2024-10-08 DIAGNOSIS — L821 Other seborrheic keratosis: Secondary | ICD-10-CM | POA: Diagnosis not present

## 2024-11-01 ENCOUNTER — Telehealth (HOSPITAL_BASED_OUTPATIENT_CLINIC_OR_DEPARTMENT_OTHER): Payer: Self-pay

## 2024-11-01 NOTE — Telephone Encounter (Signed)
 Face to face notes sent to [facility] confirmation received

## 2024-11-11 ENCOUNTER — Other Ambulatory Visit (HOSPITAL_BASED_OUTPATIENT_CLINIC_OR_DEPARTMENT_OTHER): Payer: Self-pay

## 2024-11-11 DIAGNOSIS — G4733 Obstructive sleep apnea (adult) (pediatric): Secondary | ICD-10-CM

## 2024-11-11 NOTE — Telephone Encounter (Signed)
 Copied from CRM #[id number]. Topic: Clinical - Order For Equipment >> Oct 28, 2024  2:40 PM Whitney O wrote: Reason for CRM: shante from [facility] [employer] calling on behalf of patient because he is trying to order his bipap supplies . we are in need of the clinical notes and the prescription order. and we will send documentation request over Dr jude Did give the fax number for [facility] >> Nov 01, 2024 10:24 AM CMA Warren ORN wrote: Fax recevied >> Nov 11, 2024 12:14 PM Russell PARAS wrote: Pt is contacting clinic regarding order for BiPAP supplies. He was advised by his insurance [employer] that a PA will need to be sent to them, before processing of the order  Pt requested call back once PA has been sent  CB#  [phone]   Patient still having difficulty fulfilling his BiPAP Order. Can you please advise?  Previous documentation fulfilled with [facility] on 11/01/2024

## 2024-11-12 NOTE — Telephone Encounter (Signed)
 Order was sent to [facility] yesterday for supplies

## 2025-01-14 NOTE — Progress Notes (Signed)
 Russell Bowers                                          MRN: [id number]   01/14/2025   The [facility] Quality Team Specialist reviewed this patient medical record for the purposes of chart review for care gap closure. The following were reviewed: chart review for care gap closure-glycemic status assessment.    [facility]BCI Quality Team
